# Patient Record
Sex: Female | Born: 1935 | Race: White | Hispanic: No | Smoking: Former smoker
Health system: Southern US, Community
[De-identification: ages and names within clinical notes are randomized; demographics above are authoritative.]

## PROBLEM LIST (undated history)

## (undated) DIAGNOSIS — E039 Hypothyroidism, unspecified: Secondary | ICD-10-CM

## (undated) DIAGNOSIS — Z950 Presence of cardiac pacemaker: Secondary | ICD-10-CM

## (undated) DIAGNOSIS — I10 Essential (primary) hypertension: Secondary | ICD-10-CM

## (undated) DIAGNOSIS — D649 Anemia, unspecified: Secondary | ICD-10-CM

## (undated) DIAGNOSIS — E871 Hypo-osmolality and hyponatremia: Secondary | ICD-10-CM

## (undated) DIAGNOSIS — Z9889 Other specified postprocedural states: Secondary | ICD-10-CM

## (undated) DIAGNOSIS — T4145XA Adverse effect of unspecified anesthetic, initial encounter: Secondary | ICD-10-CM

## (undated) DIAGNOSIS — Z9289 Personal history of other medical treatment: Secondary | ICD-10-CM

## (undated) DIAGNOSIS — R112 Nausea with vomiting, unspecified: Secondary | ICD-10-CM

## (undated) DIAGNOSIS — M1990 Unspecified osteoarthritis, unspecified site: Secondary | ICD-10-CM

## (undated) DIAGNOSIS — T8859XA Other complications of anesthesia, initial encounter: Secondary | ICD-10-CM

## (undated) DIAGNOSIS — E538 Deficiency of other specified B group vitamins: Secondary | ICD-10-CM

## (undated) DIAGNOSIS — K219 Gastro-esophageal reflux disease without esophagitis: Secondary | ICD-10-CM

## (undated) DIAGNOSIS — J189 Pneumonia, unspecified organism: Secondary | ICD-10-CM

## (undated) DIAGNOSIS — J449 Chronic obstructive pulmonary disease, unspecified: Secondary | ICD-10-CM

## (undated) DIAGNOSIS — G4700 Insomnia, unspecified: Secondary | ICD-10-CM

## (undated) DIAGNOSIS — K3189 Other diseases of stomach and duodenum: Secondary | ICD-10-CM

## (undated) DIAGNOSIS — J45909 Unspecified asthma, uncomplicated: Secondary | ICD-10-CM

## (undated) DIAGNOSIS — C499 Malignant neoplasm of connective and soft tissue, unspecified: Secondary | ICD-10-CM

## (undated) DIAGNOSIS — C50919 Malignant neoplasm of unspecified site of unspecified female breast: Secondary | ICD-10-CM

## (undated) DIAGNOSIS — K5732 Diverticulitis of large intestine without perforation or abscess without bleeding: Secondary | ICD-10-CM

## (undated) DIAGNOSIS — A0472 Enterocolitis due to Clostridium difficile, not specified as recurrent: Secondary | ICD-10-CM

## (undated) DIAGNOSIS — D518 Other vitamin B12 deficiency anemias: Principal | ICD-10-CM

## (undated) DIAGNOSIS — F0390 Unspecified dementia without behavioral disturbance: Secondary | ICD-10-CM

## (undated) DIAGNOSIS — M5430 Sciatica, unspecified side: Secondary | ICD-10-CM

## (undated) DIAGNOSIS — K31819 Angiodysplasia of stomach and duodenum without bleeding: Secondary | ICD-10-CM

## (undated) DIAGNOSIS — F419 Anxiety disorder, unspecified: Secondary | ICD-10-CM

## (undated) DIAGNOSIS — K5520 Angiodysplasia of colon without hemorrhage: Secondary | ICD-10-CM

## (undated) DIAGNOSIS — N289 Disorder of kidney and ureter, unspecified: Secondary | ICD-10-CM

## (undated) DIAGNOSIS — L989 Disorder of the skin and subcutaneous tissue, unspecified: Principal | ICD-10-CM

## (undated) DIAGNOSIS — Z9981 Dependence on supplemental oxygen: Secondary | ICD-10-CM

## (undated) DIAGNOSIS — M720 Palmar fascial fibromatosis [Dupuytren]: Secondary | ICD-10-CM

## (undated) DIAGNOSIS — B029 Zoster without complications: Secondary | ICD-10-CM

## (undated) DIAGNOSIS — I4891 Unspecified atrial fibrillation: Secondary | ICD-10-CM

## (undated) DIAGNOSIS — N390 Urinary tract infection, site not specified: Secondary | ICD-10-CM

## (undated) DIAGNOSIS — C801 Malignant (primary) neoplasm, unspecified: Secondary | ICD-10-CM

## (undated) HISTORY — PX: ABDOMINAL HYSTERECTOMY: SUR658

## (undated) HISTORY — PX: NASAL SINUS SURGERY: SHX719

## (undated) HISTORY — DX: Unspecified osteoarthritis, unspecified site: M19.90

## (undated) HISTORY — DX: Other diseases of stomach and duodenum: K31.89

## (undated) HISTORY — DX: Enterocolitis due to Clostridium difficile, not specified as recurrent: A04.72

## (undated) HISTORY — PX: EYE SURGERY: SHX253

## (undated) HISTORY — DX: Angiodysplasia of colon without hemorrhage: K55.20

## (undated) HISTORY — PX: HERNIA REPAIR: SHX51

## (undated) HISTORY — PX: BREAST SURGERY: SHX581

## (undated) HISTORY — DX: Urinary tract infection, site not specified: N39.0

## (undated) HISTORY — DX: Anemia, unspecified: D64.9

## (undated) HISTORY — PX: FINGER AMPUTATION: SHX636

## (undated) HISTORY — DX: Sciatica, unspecified side: M54.30

## (undated) HISTORY — DX: Hypo-osmolality and hyponatremia: E87.1

## (undated) HISTORY — PX: TONSILLECTOMY: SUR1361

## (undated) HISTORY — DX: Pneumonia, unspecified organism: J18.9

## (undated) HISTORY — DX: Angiodysplasia of stomach and duodenum without bleeding: K31.819

## (undated) HISTORY — PX: INSERT / REPLACE / REMOVE PACEMAKER: SUR710

## (undated) HISTORY — DX: Diverticulitis of large intestine without perforation or abscess without bleeding: K57.32

## (undated) HISTORY — DX: Gastro-esophageal reflux disease without esophagitis: K21.9

## (undated) HISTORY — DX: Hypothyroidism, unspecified: E03.9

## (undated) HISTORY — DX: Unspecified dementia, unspecified severity, without behavioral disturbance, psychotic disturbance, mood disturbance, and anxiety: F03.90

## (undated) HISTORY — PX: APPENDECTOMY: SHX54

## (undated) HISTORY — DX: Insomnia, unspecified: G47.00

## (undated) HISTORY — DX: Zoster without complications: B02.9

## (undated) HISTORY — PX: ABDOMINAL WOUND DEHISCENCE: SHX540

## (undated) HISTORY — DX: Malignant neoplasm of unspecified site of unspecified female breast: C50.919

## (undated) HISTORY — DX: Malignant neoplasm of connective and soft tissue, unspecified: C49.9

## (undated) HISTORY — DX: Chronic obstructive pulmonary disease, unspecified: J44.9

## (undated) HISTORY — DX: Other vitamin B12 deficiency anemias: D51.8

## (undated) HISTORY — DX: Disorder of kidney and ureter, unspecified: N28.9

## (undated) HISTORY — PX: ABDOMINAL HYSTERECTOMY: SHX81

## (undated) HISTORY — DX: Essential (primary) hypertension: I10

## (undated) HISTORY — DX: Disorder of the skin and subcutaneous tissue, unspecified: L98.9

## (undated) HISTORY — DX: Unspecified atrial fibrillation: I48.91

## (undated) HISTORY — PX: FOOT SURGERY: SHX648

## (undated) HISTORY — DX: Deficiency of other specified B group vitamins: E53.8

## (undated) HISTORY — PX: COLON SURGERY: SHX602

## (undated) HISTORY — PX: PACEMAKER INSERTION: SHX728

---

## 2000-05-31 ENCOUNTER — Ambulatory Visit (HOSPITAL_COMMUNITY): Admission: RE | Admit: 2000-05-31 | Discharge: 2000-05-31 | Payer: Self-pay | Admitting: Internal Medicine

## 2000-05-31 ENCOUNTER — Encounter: Payer: Self-pay | Admitting: Internal Medicine

## 2002-02-19 ENCOUNTER — Emergency Department (HOSPITAL_COMMUNITY): Admission: EM | Admit: 2002-02-19 | Discharge: 2002-02-19 | Payer: Self-pay | Admitting: Emergency Medicine

## 2002-02-19 ENCOUNTER — Inpatient Hospital Stay (HOSPITAL_COMMUNITY): Admission: EM | Admit: 2002-02-19 | Discharge: 2002-02-21 | Payer: Self-pay | Admitting: Internal Medicine

## 2002-02-19 ENCOUNTER — Encounter: Payer: Self-pay | Admitting: Emergency Medicine

## 2002-06-10 ENCOUNTER — Encounter: Admission: RE | Admit: 2002-06-10 | Discharge: 2002-06-10 | Payer: Self-pay | Admitting: Orthopedic Surgery

## 2002-06-24 ENCOUNTER — Encounter: Admission: RE | Admit: 2002-06-24 | Discharge: 2002-06-24 | Payer: Self-pay | Admitting: Orthopedic Surgery

## 2002-06-24 ENCOUNTER — Encounter: Payer: Self-pay | Admitting: Orthopedic Surgery

## 2002-07-05 ENCOUNTER — Encounter: Admission: RE | Admit: 2002-07-05 | Discharge: 2002-07-05 | Payer: Self-pay | Admitting: General Surgery

## 2002-07-05 ENCOUNTER — Other Ambulatory Visit: Admission: RE | Admit: 2002-07-05 | Discharge: 2002-07-05 | Payer: Self-pay | Admitting: Diagnostic Radiology

## 2002-07-05 ENCOUNTER — Encounter (INDEPENDENT_AMBULATORY_CARE_PROVIDER_SITE_OTHER): Payer: Self-pay | Admitting: *Deleted

## 2002-07-05 ENCOUNTER — Encounter: Payer: Self-pay | Admitting: General Surgery

## 2002-07-16 ENCOUNTER — Encounter: Payer: Self-pay | Admitting: General Surgery

## 2002-07-16 ENCOUNTER — Encounter: Admission: RE | Admit: 2002-07-16 | Discharge: 2002-07-16 | Payer: Self-pay | Admitting: General Surgery

## 2002-07-17 ENCOUNTER — Ambulatory Visit (HOSPITAL_BASED_OUTPATIENT_CLINIC_OR_DEPARTMENT_OTHER): Admission: RE | Admit: 2002-07-17 | Discharge: 2002-07-17 | Payer: Self-pay | Admitting: General Surgery

## 2002-07-17 ENCOUNTER — Encounter: Admission: RE | Admit: 2002-07-17 | Discharge: 2002-07-17 | Payer: Self-pay | Admitting: General Surgery

## 2002-07-17 ENCOUNTER — Encounter (INDEPENDENT_AMBULATORY_CARE_PROVIDER_SITE_OTHER): Payer: Self-pay | Admitting: *Deleted

## 2002-07-17 ENCOUNTER — Encounter: Payer: Self-pay | Admitting: General Surgery

## 2002-07-29 ENCOUNTER — Ambulatory Visit: Admission: RE | Admit: 2002-07-29 | Discharge: 2002-08-06 | Payer: Self-pay | Admitting: Radiation Oncology

## 2002-08-19 ENCOUNTER — Encounter: Payer: Self-pay | Admitting: Orthopedic Surgery

## 2002-08-19 ENCOUNTER — Encounter: Admission: RE | Admit: 2002-08-19 | Discharge: 2002-08-19 | Payer: Self-pay | Admitting: Orthopedic Surgery

## 2002-08-20 ENCOUNTER — Ambulatory Visit (HOSPITAL_BASED_OUTPATIENT_CLINIC_OR_DEPARTMENT_OTHER): Admission: RE | Admit: 2002-08-20 | Discharge: 2002-08-20 | Payer: Self-pay | Admitting: General Surgery

## 2002-12-16 ENCOUNTER — Encounter: Payer: Self-pay | Admitting: Orthopedic Surgery

## 2002-12-16 ENCOUNTER — Encounter: Admission: RE | Admit: 2002-12-16 | Discharge: 2002-12-16 | Payer: Self-pay | Admitting: Orthopedic Surgery

## 2003-01-01 ENCOUNTER — Encounter: Admission: RE | Admit: 2003-01-01 | Discharge: 2003-01-01 | Payer: Self-pay | Admitting: Orthopedic Surgery

## 2003-01-01 ENCOUNTER — Encounter: Payer: Self-pay | Admitting: Orthopedic Surgery

## 2003-02-25 ENCOUNTER — Encounter: Payer: Self-pay | Admitting: Oncology

## 2003-02-25 ENCOUNTER — Encounter: Admission: RE | Admit: 2003-02-25 | Discharge: 2003-02-25 | Payer: Self-pay | Admitting: Oncology

## 2003-03-20 ENCOUNTER — Encounter: Payer: Self-pay | Admitting: Allergy and Immunology

## 2003-03-20 ENCOUNTER — Encounter: Admission: RE | Admit: 2003-03-20 | Discharge: 2003-03-20 | Payer: Self-pay | Admitting: Allergy and Immunology

## 2003-03-25 ENCOUNTER — Encounter: Payer: Self-pay | Admitting: Emergency Medicine

## 2003-03-25 ENCOUNTER — Emergency Department (HOSPITAL_COMMUNITY): Admission: EM | Admit: 2003-03-25 | Discharge: 2003-03-25 | Payer: Self-pay | Admitting: Emergency Medicine

## 2003-04-28 ENCOUNTER — Encounter: Admission: RE | Admit: 2003-04-28 | Discharge: 2003-04-28 | Payer: Self-pay | Admitting: Orthopedic Surgery

## 2003-05-20 ENCOUNTER — Inpatient Hospital Stay (HOSPITAL_COMMUNITY): Admission: EM | Admit: 2003-05-20 | Discharge: 2003-05-28 | Payer: Self-pay | Admitting: Emergency Medicine

## 2003-05-21 ENCOUNTER — Encounter: Payer: Self-pay | Admitting: Cardiology

## 2003-06-16 ENCOUNTER — Encounter: Admission: RE | Admit: 2003-06-16 | Discharge: 2003-06-16 | Payer: Self-pay | Admitting: Oncology

## 2003-07-31 ENCOUNTER — Emergency Department (HOSPITAL_COMMUNITY): Admission: EM | Admit: 2003-07-31 | Discharge: 2003-07-31 | Payer: Self-pay | Admitting: Emergency Medicine

## 2003-10-02 ENCOUNTER — Encounter: Admission: RE | Admit: 2003-10-02 | Discharge: 2003-10-02 | Payer: Self-pay | Admitting: Orthopedic Surgery

## 2003-10-20 ENCOUNTER — Ambulatory Visit (HOSPITAL_COMMUNITY): Admission: RE | Admit: 2003-10-20 | Discharge: 2003-10-20 | Payer: Self-pay | Admitting: Internal Medicine

## 2003-11-20 ENCOUNTER — Inpatient Hospital Stay (HOSPITAL_COMMUNITY): Admission: RE | Admit: 2003-11-20 | Discharge: 2003-11-25 | Payer: Self-pay | Admitting: Surgery

## 2003-11-20 ENCOUNTER — Encounter: Admission: RE | Admit: 2003-11-20 | Discharge: 2003-12-16 | Payer: Self-pay | Admitting: General Surgery

## 2004-02-25 ENCOUNTER — Encounter: Admission: RE | Admit: 2004-02-25 | Discharge: 2004-02-25 | Payer: Self-pay | Admitting: Orthopedic Surgery

## 2004-03-01 ENCOUNTER — Encounter: Admission: RE | Admit: 2004-03-01 | Discharge: 2004-03-01 | Payer: Self-pay | Admitting: Orthopedic Surgery

## 2004-03-16 ENCOUNTER — Encounter: Admission: RE | Admit: 2004-03-16 | Discharge: 2004-03-16 | Payer: Self-pay | Admitting: Orthopedic Surgery

## 2004-04-21 ENCOUNTER — Ambulatory Visit: Payer: Self-pay | Admitting: Internal Medicine

## 2004-04-22 ENCOUNTER — Ambulatory Visit: Payer: Self-pay | Admitting: Cardiology

## 2004-04-28 ENCOUNTER — Ambulatory Visit: Payer: Self-pay | Admitting: *Deleted

## 2004-04-29 ENCOUNTER — Encounter (INDEPENDENT_AMBULATORY_CARE_PROVIDER_SITE_OTHER): Payer: Self-pay | Admitting: Specialist

## 2004-04-29 ENCOUNTER — Ambulatory Visit (HOSPITAL_COMMUNITY): Admission: RE | Admit: 2004-04-29 | Discharge: 2004-04-29 | Payer: Self-pay | Admitting: Internal Medicine

## 2004-04-29 ENCOUNTER — Ambulatory Visit: Payer: Self-pay | Admitting: Internal Medicine

## 2004-05-05 ENCOUNTER — Ambulatory Visit: Payer: Self-pay | Admitting: Cardiovascular Disease

## 2004-05-25 ENCOUNTER — Ambulatory Visit: Payer: Self-pay | Admitting: Cardiology

## 2004-06-16 ENCOUNTER — Encounter: Admission: RE | Admit: 2004-06-16 | Discharge: 2004-06-16 | Payer: Self-pay | Admitting: Oncology

## 2004-06-23 ENCOUNTER — Ambulatory Visit: Payer: Self-pay | Admitting: Oncology

## 2004-07-10 ENCOUNTER — Ambulatory Visit: Payer: Self-pay | Admitting: Internal Medicine

## 2004-07-22 ENCOUNTER — Ambulatory Visit: Payer: Self-pay | Admitting: Internal Medicine

## 2004-07-27 ENCOUNTER — Ambulatory Visit: Payer: Self-pay | Admitting: Cardiology

## 2004-08-10 ENCOUNTER — Ambulatory Visit: Payer: Self-pay | Admitting: *Deleted

## 2004-08-10 ENCOUNTER — Ambulatory Visit: Payer: Self-pay | Admitting: Internal Medicine

## 2004-08-18 ENCOUNTER — Ambulatory Visit: Payer: Self-pay | Admitting: Internal Medicine

## 2004-08-20 ENCOUNTER — Ambulatory Visit: Payer: Self-pay | Admitting: Cardiology

## 2004-09-01 ENCOUNTER — Ambulatory Visit: Payer: Self-pay | Admitting: Oncology

## 2004-09-03 ENCOUNTER — Ambulatory Visit: Payer: Self-pay | Admitting: Internal Medicine

## 2004-09-20 ENCOUNTER — Encounter: Admission: RE | Admit: 2004-09-20 | Discharge: 2004-09-20 | Payer: Self-pay | Admitting: Oncology

## 2004-09-20 ENCOUNTER — Encounter (INDEPENDENT_AMBULATORY_CARE_PROVIDER_SITE_OTHER): Payer: Self-pay | Admitting: *Deleted

## 2004-09-23 ENCOUNTER — Ambulatory Visit: Payer: Self-pay | Admitting: Internal Medicine

## 2004-10-11 ENCOUNTER — Ambulatory Visit (HOSPITAL_COMMUNITY): Admission: RE | Admit: 2004-10-11 | Discharge: 2004-10-11 | Payer: Self-pay | Admitting: Orthopedic Surgery

## 2004-10-15 ENCOUNTER — Ambulatory Visit: Payer: Self-pay | Admitting: Internal Medicine

## 2004-10-18 ENCOUNTER — Ambulatory Visit (HOSPITAL_COMMUNITY): Admission: RE | Admit: 2004-10-18 | Discharge: 2004-10-18 | Payer: Self-pay | Admitting: Internal Medicine

## 2004-10-20 ENCOUNTER — Ambulatory Visit: Payer: Self-pay | Admitting: Internal Medicine

## 2004-10-21 ENCOUNTER — Ambulatory Visit: Payer: Self-pay | Admitting: Internal Medicine

## 2004-10-28 ENCOUNTER — Ambulatory Visit: Payer: Self-pay | Admitting: Internal Medicine

## 2004-11-26 ENCOUNTER — Ambulatory Visit: Payer: Self-pay | Admitting: Cardiology

## 2004-12-01 ENCOUNTER — Ambulatory Visit: Payer: Self-pay | Admitting: Oncology

## 2004-12-03 ENCOUNTER — Ambulatory Visit: Payer: Self-pay | Admitting: Cardiology

## 2004-12-07 ENCOUNTER — Ambulatory Visit: Payer: Self-pay | Admitting: Oncology

## 2004-12-14 ENCOUNTER — Ambulatory Visit: Payer: Self-pay | Admitting: Cardiology

## 2004-12-31 ENCOUNTER — Encounter: Admission: RE | Admit: 2004-12-31 | Discharge: 2004-12-31 | Payer: Self-pay | Admitting: Allergy and Immunology

## 2005-01-04 ENCOUNTER — Ambulatory Visit: Payer: Self-pay | Admitting: Cardiology

## 2005-02-03 ENCOUNTER — Ambulatory Visit: Payer: Self-pay | Admitting: Internal Medicine

## 2005-02-04 ENCOUNTER — Ambulatory Visit: Payer: Self-pay | Admitting: Internal Medicine

## 2005-03-09 ENCOUNTER — Ambulatory Visit: Payer: Self-pay | Admitting: Cardiology

## 2005-03-10 ENCOUNTER — Encounter: Admission: RE | Admit: 2005-03-10 | Discharge: 2005-03-10 | Payer: Self-pay | Admitting: Orthopedic Surgery

## 2005-03-11 ENCOUNTER — Ambulatory Visit: Payer: Self-pay | Admitting: Internal Medicine

## 2005-03-31 ENCOUNTER — Ambulatory Visit: Payer: Self-pay | Admitting: Cardiology

## 2005-04-01 ENCOUNTER — Ambulatory Visit: Payer: Self-pay | Admitting: Oncology

## 2005-04-06 ENCOUNTER — Ambulatory Visit: Payer: Self-pay | Admitting: Cardiology

## 2005-04-07 ENCOUNTER — Encounter: Admission: RE | Admit: 2005-04-07 | Discharge: 2005-04-07 | Payer: Self-pay | Admitting: Orthopedic Surgery

## 2005-04-11 ENCOUNTER — Ambulatory Visit (HOSPITAL_COMMUNITY): Admission: RE | Admit: 2005-04-11 | Discharge: 2005-04-11 | Payer: Self-pay | Admitting: Oncology

## 2005-04-11 ENCOUNTER — Ambulatory Visit: Payer: Self-pay | Admitting: Internal Medicine

## 2005-04-14 ENCOUNTER — Ambulatory Visit: Payer: Self-pay | Admitting: Internal Medicine

## 2005-04-20 ENCOUNTER — Ambulatory Visit (HOSPITAL_COMMUNITY): Admission: RE | Admit: 2005-04-20 | Discharge: 2005-04-20 | Payer: Self-pay | Admitting: Oncology

## 2005-04-22 ENCOUNTER — Ambulatory Visit: Payer: Self-pay | Admitting: Internal Medicine

## 2005-04-27 ENCOUNTER — Ambulatory Visit (HOSPITAL_COMMUNITY): Admission: RE | Admit: 2005-04-27 | Discharge: 2005-04-27 | Payer: Self-pay | Admitting: Oncology

## 2005-05-10 ENCOUNTER — Encounter: Admission: RE | Admit: 2005-05-10 | Discharge: 2005-05-10 | Payer: Self-pay | Admitting: Orthopedic Surgery

## 2005-05-10 ENCOUNTER — Ambulatory Visit: Payer: Self-pay | Admitting: Cardiology

## 2005-05-17 ENCOUNTER — Ambulatory Visit: Payer: Self-pay | Admitting: *Deleted

## 2005-05-17 ENCOUNTER — Ambulatory Visit: Payer: Self-pay | Admitting: Internal Medicine

## 2005-05-31 ENCOUNTER — Ambulatory Visit: Payer: Self-pay | Admitting: Cardiology

## 2005-06-15 ENCOUNTER — Ambulatory Visit: Payer: Self-pay | Admitting: *Deleted

## 2005-06-29 ENCOUNTER — Ambulatory Visit: Payer: Self-pay | Admitting: Internal Medicine

## 2005-07-06 ENCOUNTER — Ambulatory Visit: Payer: Self-pay | Admitting: Cardiology

## 2005-07-13 ENCOUNTER — Encounter: Admission: RE | Admit: 2005-07-13 | Discharge: 2005-07-13 | Payer: Self-pay | Admitting: General Surgery

## 2005-07-19 ENCOUNTER — Ambulatory Visit (HOSPITAL_COMMUNITY): Admission: RE | Admit: 2005-07-19 | Discharge: 2005-07-19 | Payer: Self-pay | Admitting: Oncology

## 2005-08-10 ENCOUNTER — Ambulatory Visit: Payer: Self-pay | Admitting: Cardiology

## 2005-08-10 ENCOUNTER — Ambulatory Visit: Payer: Self-pay | Admitting: Internal Medicine

## 2005-08-26 ENCOUNTER — Ambulatory Visit: Payer: Self-pay | Admitting: Oncology

## 2005-08-29 ENCOUNTER — Ambulatory Visit (HOSPITAL_COMMUNITY): Admission: RE | Admit: 2005-08-29 | Discharge: 2005-08-29 | Payer: Self-pay | Admitting: Oncology

## 2005-08-31 ENCOUNTER — Ambulatory Visit: Payer: Self-pay | Admitting: Cardiology

## 2005-09-05 ENCOUNTER — Ambulatory Visit: Payer: Self-pay | Admitting: Internal Medicine

## 2005-09-07 ENCOUNTER — Ambulatory Visit: Payer: Self-pay | Admitting: Internal Medicine

## 2005-09-26 ENCOUNTER — Ambulatory Visit: Payer: Self-pay | Admitting: Cardiology

## 2005-10-04 ENCOUNTER — Ambulatory Visit: Payer: Self-pay | Admitting: Internal Medicine

## 2005-10-04 ENCOUNTER — Ambulatory Visit: Payer: Self-pay | Admitting: Cardiology

## 2005-10-18 ENCOUNTER — Ambulatory Visit: Payer: Self-pay | Admitting: Cardiology

## 2005-10-21 ENCOUNTER — Inpatient Hospital Stay (HOSPITAL_COMMUNITY): Admission: EM | Admit: 2005-10-21 | Discharge: 2005-10-25 | Payer: Self-pay | Admitting: Emergency Medicine

## 2005-10-21 ENCOUNTER — Encounter: Payer: Self-pay | Admitting: Internal Medicine

## 2005-10-26 ENCOUNTER — Ambulatory Visit: Payer: Self-pay | Admitting: Cardiology

## 2005-10-31 ENCOUNTER — Ambulatory Visit: Payer: Self-pay | Admitting: Cardiology

## 2005-11-16 ENCOUNTER — Ambulatory Visit: Payer: Self-pay | Admitting: Oncology

## 2005-11-17 ENCOUNTER — Ambulatory Visit: Payer: Self-pay | Admitting: Cardiology

## 2005-11-22 LAB — CBC WITH DIFFERENTIAL/PLATELET
Basophils Absolute: 0 10*3/uL (ref 0.0–0.1)
HCT: 34.6 % — ABNORMAL LOW (ref 34.8–46.6)
HGB: 11.6 g/dL (ref 11.6–15.9)
MONO#: 0.1 10*3/uL (ref 0.1–0.9)
NEUT%: 90.3 % — ABNORMAL HIGH (ref 39.6–76.8)
WBC: 8 10*3/uL (ref 3.9–10.0)
lymph#: 0.6 10*3/uL — ABNORMAL LOW (ref 0.9–3.3)

## 2005-11-22 LAB — COMPREHENSIVE METABOLIC PANEL
BUN: 23 mg/dL (ref 6–23)
CO2: 26 mEq/L (ref 19–32)
Calcium: 9.2 mg/dL (ref 8.4–10.5)
Chloride: 105 mEq/L (ref 96–112)
Creatinine, Ser: 1.11 mg/dL (ref 0.40–1.20)
Glucose, Bld: 99 mg/dL (ref 70–99)

## 2005-11-23 ENCOUNTER — Ambulatory Visit: Payer: Self-pay | Admitting: Internal Medicine

## 2005-11-24 ENCOUNTER — Ambulatory Visit (HOSPITAL_COMMUNITY): Admission: RE | Admit: 2005-11-24 | Discharge: 2005-11-24 | Payer: Self-pay | Admitting: Oncology

## 2005-12-08 ENCOUNTER — Ambulatory Visit: Payer: Self-pay | Admitting: Cardiology

## 2006-01-03 ENCOUNTER — Ambulatory Visit: Payer: Self-pay | Admitting: Cardiology

## 2006-01-19 ENCOUNTER — Ambulatory Visit: Payer: Self-pay | Admitting: Internal Medicine

## 2006-01-31 ENCOUNTER — Ambulatory Visit: Payer: Self-pay | Admitting: Cardiology

## 2006-02-27 ENCOUNTER — Ambulatory Visit: Payer: Self-pay | Admitting: Oncology

## 2006-02-28 ENCOUNTER — Ambulatory Visit: Payer: Self-pay | Admitting: Cardiovascular Disease

## 2006-03-01 ENCOUNTER — Ambulatory Visit (HOSPITAL_COMMUNITY): Admission: RE | Admit: 2006-03-01 | Discharge: 2006-03-01 | Payer: Self-pay | Admitting: Oncology

## 2006-03-01 LAB — COMPREHENSIVE METABOLIC PANEL
ALT: 21 U/L (ref 0–40)
Alkaline Phosphatase: 45 U/L (ref 39–117)
CO2: 22 mEq/L (ref 19–32)
Creatinine, Ser: 1.23 mg/dL — ABNORMAL HIGH (ref 0.40–1.20)
Glucose, Bld: 103 mg/dL — ABNORMAL HIGH (ref 70–99)
Total Bilirubin: 0.4 mg/dL (ref 0.3–1.2)

## 2006-03-01 LAB — CBC WITH DIFFERENTIAL/PLATELET
BASO%: 0.5 % (ref 0.0–2.0)
Basophils Absolute: 0 10*3/uL (ref 0.0–0.1)
EOS%: 1.6 % (ref 0.0–7.0)
HCT: 37.9 % (ref 34.8–46.6)
LYMPH%: 11.5 % — ABNORMAL LOW (ref 14.0–48.0)
MCH: 29.8 pg (ref 26.0–34.0)
MCHC: 33.7 g/dL (ref 32.0–36.0)
MONO#: 0.4 10*3/uL (ref 0.1–0.9)
NEUT%: 82 % — ABNORMAL HIGH (ref 39.6–76.8)
Platelets: 290 10*3/uL (ref 145–400)

## 2006-03-01 LAB — CANCER ANTIGEN 27.29: CA 27.29: 37 U/mL (ref 0–39)

## 2006-03-22 ENCOUNTER — Ambulatory Visit: Payer: Self-pay | Admitting: Internal Medicine

## 2006-03-28 ENCOUNTER — Ambulatory Visit: Payer: Self-pay | Admitting: Internal Medicine

## 2006-04-25 ENCOUNTER — Ambulatory Visit: Payer: Self-pay | Admitting: *Deleted

## 2006-05-02 ENCOUNTER — Ambulatory Visit (HOSPITAL_COMMUNITY): Admission: RE | Admit: 2006-05-02 | Discharge: 2006-05-02 | Payer: Self-pay | Admitting: Oncology

## 2006-06-28 ENCOUNTER — Ambulatory Visit: Payer: Self-pay | Admitting: Internal Medicine

## 2006-06-28 ENCOUNTER — Encounter: Admission: RE | Admit: 2006-06-28 | Discharge: 2006-06-28 | Payer: Self-pay | Admitting: Internal Medicine

## 2006-06-28 ENCOUNTER — Ambulatory Visit: Payer: Self-pay | Admitting: Cardiology

## 2006-07-07 ENCOUNTER — Ambulatory Visit: Payer: Self-pay | Admitting: Cardiovascular Disease

## 2006-07-14 ENCOUNTER — Ambulatory Visit: Payer: Self-pay | Admitting: Internal Medicine

## 2006-07-14 LAB — CONVERTED CEMR LAB
BUN: 29 mg/dL — ABNORMAL HIGH (ref 6–23)
CO2: 27 meq/L (ref 19–32)
Calcium: 9.6 mg/dL (ref 8.4–10.5)
Chloride: 107 meq/L (ref 96–112)
Creatinine, Ser: 1.3 mg/dL — ABNORMAL HIGH (ref 0.4–1.2)
GFR calc Af Amer: 52 mL/min
GFR calc non Af Amer: 43 mL/min
Glucose, Bld: 89 mg/dL (ref 70–99)
HCT: 31.5 % — ABNORMAL LOW (ref 36.0–46.0)
Hemoglobin: 10.8 g/dL — ABNORMAL LOW (ref 12.0–15.0)
INR: 2.1 — ABNORMAL HIGH (ref 0.9–2.0)
MCHC: 34.4 g/dL (ref 30.0–36.0)
MCV: 87.8 fL (ref 78.0–100.0)
Platelets: 210 10*3/uL (ref 150–400)
Potassium: 4.3 meq/L (ref 3.5–5.1)
Prothrombin Time: 18.6 s — ABNORMAL HIGH (ref 10.0–14.0)
RBC: 3.58 M/uL — ABNORMAL LOW (ref 3.87–5.11)
RDW: 13.2 % (ref 11.5–14.6)
Sodium: 139 meq/L (ref 135–145)
WBC: 8.8 10*3/uL (ref 4.5–10.5)
aPTT: 30.3 s (ref 26.5–36.5)

## 2006-07-18 ENCOUNTER — Encounter: Admission: RE | Admit: 2006-07-18 | Discharge: 2006-07-18 | Payer: Self-pay | Admitting: General Surgery

## 2006-07-20 ENCOUNTER — Ambulatory Visit: Payer: Self-pay | Admitting: Internal Medicine

## 2006-07-20 ENCOUNTER — Ambulatory Visit (HOSPITAL_COMMUNITY): Admission: RE | Admit: 2006-07-20 | Discharge: 2006-07-20 | Payer: Self-pay | Admitting: Internal Medicine

## 2006-07-21 ENCOUNTER — Ambulatory Visit: Payer: Self-pay | Admitting: Internal Medicine

## 2006-08-02 ENCOUNTER — Ambulatory Visit: Payer: Self-pay | Admitting: *Deleted

## 2006-08-02 ENCOUNTER — Ambulatory Visit: Payer: Self-pay

## 2006-08-11 ENCOUNTER — Ambulatory Visit: Payer: Self-pay | Admitting: Internal Medicine

## 2006-08-23 ENCOUNTER — Ambulatory Visit: Payer: Self-pay | Admitting: Oncology

## 2006-08-25 ENCOUNTER — Ambulatory Visit: Payer: Self-pay | Admitting: Cardiology

## 2006-08-28 LAB — COMPREHENSIVE METABOLIC PANEL
ALT: 17 U/L (ref 0–35)
AST: 20 U/L (ref 0–37)
Albumin: 3.7 g/dL (ref 3.5–5.2)
Alkaline Phosphatase: 53 U/L (ref 39–117)
BUN: 23 mg/dL (ref 6–23)
Chloride: 107 mEq/L (ref 96–112)
Potassium: 3.8 mEq/L (ref 3.5–5.3)
Sodium: 140 mEq/L (ref 135–145)

## 2006-08-28 LAB — CBC WITH DIFFERENTIAL/PLATELET
BASO%: 0.2 % (ref 0.0–2.0)
Basophils Absolute: 0 10*3/uL (ref 0.0–0.1)
HCT: 30.3 % — ABNORMAL LOW (ref 34.8–46.6)
HGB: 10.4 g/dL — ABNORMAL LOW (ref 11.6–15.9)
MCHC: 34.3 g/dL (ref 32.0–36.0)
MONO#: 0.7 10*3/uL (ref 0.1–0.9)
NEUT%: 83.9 % — ABNORMAL HIGH (ref 39.6–76.8)
WBC: 11.7 10*3/uL — ABNORMAL HIGH (ref 3.9–10.0)
lymph#: 0.9 10*3/uL (ref 0.9–3.3)

## 2006-08-29 ENCOUNTER — Ambulatory Visit: Payer: Self-pay

## 2006-09-04 ENCOUNTER — Ambulatory Visit: Payer: Self-pay | Admitting: Internal Medicine

## 2006-09-04 LAB — CBC & DIFF AND RETIC
Basophils Absolute: 0.1 10*3/uL (ref 0.0–0.1)
EOS%: 4.9 % (ref 0.0–7.0)
Eosinophils Absolute: 0.5 10*3/uL (ref 0.0–0.5)
HGB: 12 g/dL (ref 11.6–15.9)
LYMPH%: 8.9 % — ABNORMAL LOW (ref 14.0–48.0)
MCH: 28.2 pg (ref 26.0–34.0)
MCV: 83.9 fL (ref 81.0–101.0)
MONO%: 3.4 % (ref 0.0–13.0)
NEUT#: 8.6 10*3/uL — ABNORMAL HIGH (ref 1.5–6.5)
Platelets: 268 10*3/uL (ref 145–400)
RBC: 4.24 10*6/uL (ref 3.70–5.32)

## 2006-09-05 LAB — VITAMIN B12: Vitamin B-12: 649 pg/mL (ref 211–911)

## 2006-09-05 LAB — FOLATE RBC: RBC Folate: 1227 ng/mL — ABNORMAL HIGH (ref 180–600)

## 2006-09-14 ENCOUNTER — Ambulatory Visit: Payer: Self-pay | Admitting: Cardiology

## 2006-09-28 ENCOUNTER — Ambulatory Visit: Payer: Self-pay | Admitting: Cardiology

## 2006-10-26 ENCOUNTER — Ambulatory Visit: Payer: Self-pay | Admitting: Cardiology

## 2006-11-07 ENCOUNTER — Ambulatory Visit: Payer: Self-pay | Admitting: Internal Medicine

## 2006-11-29 ENCOUNTER — Ambulatory Visit: Payer: Self-pay | Admitting: Cardiology

## 2007-02-05 ENCOUNTER — Ambulatory Visit: Payer: Self-pay | Admitting: Cardiology

## 2007-02-22 ENCOUNTER — Ambulatory Visit: Payer: Self-pay | Admitting: Cardiology

## 2007-03-16 ENCOUNTER — Ambulatory Visit: Payer: Self-pay | Admitting: Cardiovascular Disease

## 2007-03-30 ENCOUNTER — Ambulatory Visit: Payer: Self-pay | Admitting: Cardiology

## 2007-04-05 ENCOUNTER — Ambulatory Visit: Payer: Self-pay | Admitting: Oncology

## 2007-04-10 LAB — CBC WITH DIFFERENTIAL/PLATELET
Basophils Absolute: 0 10*3/uL (ref 0.0–0.1)
Eosinophils Absolute: 0.6 10*3/uL — ABNORMAL HIGH (ref 0.0–0.5)
HCT: 34.6 % — ABNORMAL LOW (ref 34.8–46.6)
HGB: 12 g/dL (ref 11.6–15.9)
NEUT#: 4.7 10*3/uL (ref 1.5–6.5)
RDW: 13.9 % (ref 11.3–14.5)
lymph#: 2.2 10*3/uL (ref 0.9–3.3)

## 2007-04-10 LAB — COMPREHENSIVE METABOLIC PANEL
Albumin: 3.8 g/dL (ref 3.5–5.2)
BUN: 18 mg/dL (ref 6–23)
CO2: 23 mEq/L (ref 19–32)
Calcium: 9.4 mg/dL (ref 8.4–10.5)
Chloride: 105 mEq/L (ref 96–112)
Glucose, Bld: 81 mg/dL (ref 70–99)
Potassium: 3.7 mEq/L (ref 3.5–5.3)

## 2007-04-10 LAB — CANCER ANTIGEN 27.29: CA 27.29: 24 U/mL (ref 0–39)

## 2007-04-18 ENCOUNTER — Ambulatory Visit: Payer: Self-pay | Admitting: Cardiology

## 2007-05-04 ENCOUNTER — Ambulatory Visit: Payer: Self-pay | Admitting: Cardiology

## 2007-06-01 ENCOUNTER — Ambulatory Visit: Payer: Self-pay | Admitting: Cardiology

## 2007-07-03 ENCOUNTER — Ambulatory Visit: Payer: Self-pay | Admitting: Cardiovascular Disease

## 2007-07-20 ENCOUNTER — Encounter: Admission: RE | Admit: 2007-07-20 | Discharge: 2007-07-20 | Payer: Self-pay | Admitting: Oncology

## 2007-07-24 ENCOUNTER — Ambulatory Visit: Payer: Self-pay | Admitting: Cardiovascular Disease

## 2007-08-03 ENCOUNTER — Ambulatory Visit: Payer: Self-pay | Admitting: Internal Medicine

## 2007-08-23 ENCOUNTER — Ambulatory Visit: Payer: Self-pay | Admitting: Cardiovascular Disease

## 2007-09-05 ENCOUNTER — Ambulatory Visit: Payer: Self-pay | Admitting: Cardiology

## 2007-09-14 ENCOUNTER — Ambulatory Visit: Payer: Self-pay | Admitting: Cardiology

## 2007-09-19 ENCOUNTER — Ambulatory Visit: Payer: Self-pay | Admitting: Internal Medicine

## 2007-09-20 DIAGNOSIS — I10 Essential (primary) hypertension: Secondary | ICD-10-CM | POA: Insufficient documentation

## 2007-09-20 DIAGNOSIS — J45909 Unspecified asthma, uncomplicated: Secondary | ICD-10-CM | POA: Insufficient documentation

## 2007-09-20 DIAGNOSIS — K7689 Other specified diseases of liver: Secondary | ICD-10-CM

## 2007-09-20 DIAGNOSIS — C50919 Malignant neoplasm of unspecified site of unspecified female breast: Secondary | ICD-10-CM | POA: Insufficient documentation

## 2007-09-20 DIAGNOSIS — I4891 Unspecified atrial fibrillation: Secondary | ICD-10-CM | POA: Insufficient documentation

## 2007-09-20 DIAGNOSIS — M1990 Unspecified osteoarthritis, unspecified site: Secondary | ICD-10-CM | POA: Insufficient documentation

## 2007-09-20 DIAGNOSIS — E039 Hypothyroidism, unspecified: Secondary | ICD-10-CM | POA: Insufficient documentation

## 2007-09-20 DIAGNOSIS — Z9189 Other specified personal risk factors, not elsewhere classified: Secondary | ICD-10-CM

## 2007-09-20 DIAGNOSIS — M5430 Sciatica, unspecified side: Secondary | ICD-10-CM

## 2007-09-21 ENCOUNTER — Ambulatory Visit: Payer: Self-pay | Admitting: Pulmonary Disease

## 2007-09-21 DIAGNOSIS — K449 Diaphragmatic hernia without obstruction or gangrene: Secondary | ICD-10-CM | POA: Insufficient documentation

## 2007-09-21 DIAGNOSIS — C801 Malignant (primary) neoplasm, unspecified: Secondary | ICD-10-CM

## 2007-09-21 DIAGNOSIS — G4700 Insomnia, unspecified: Secondary | ICD-10-CM

## 2007-09-21 DIAGNOSIS — I509 Heart failure, unspecified: Secondary | ICD-10-CM

## 2007-09-21 DIAGNOSIS — J9600 Acute respiratory failure, unspecified whether with hypoxia or hypercapnia: Secondary | ICD-10-CM

## 2007-09-21 DIAGNOSIS — Q742 Other congenital malformations of lower limb(s), including pelvic girdle: Secondary | ICD-10-CM

## 2007-09-24 ENCOUNTER — Ambulatory Visit: Payer: Self-pay | Admitting: Pulmonary Disease

## 2007-10-04 ENCOUNTER — Ambulatory Visit: Payer: Self-pay | Admitting: Cardiology

## 2007-10-04 ENCOUNTER — Ambulatory Visit: Payer: Self-pay | Admitting: Pulmonary Disease

## 2007-10-04 DIAGNOSIS — J383 Other diseases of vocal cords: Secondary | ICD-10-CM

## 2007-10-18 ENCOUNTER — Ambulatory Visit: Payer: Self-pay | Admitting: Cardiology

## 2007-11-02 ENCOUNTER — Ambulatory Visit: Payer: Self-pay | Admitting: Cardiology

## 2007-11-06 ENCOUNTER — Ambulatory Visit: Payer: Self-pay | Admitting: Internal Medicine

## 2007-11-20 ENCOUNTER — Ambulatory Visit: Payer: Self-pay | Admitting: Cardiology

## 2007-11-30 ENCOUNTER — Ambulatory Visit: Payer: Self-pay | Admitting: Cardiology

## 2007-12-20 ENCOUNTER — Ambulatory Visit: Payer: Self-pay

## 2007-12-31 ENCOUNTER — Ambulatory Visit: Payer: Self-pay | Admitting: Cardiology

## 2008-01-14 ENCOUNTER — Ambulatory Visit: Payer: Self-pay | Admitting: Cardiovascular Disease

## 2008-01-28 ENCOUNTER — Ambulatory Visit: Payer: Self-pay | Admitting: Cardiology

## 2008-02-14 ENCOUNTER — Ambulatory Visit: Payer: Self-pay | Admitting: Cardiology

## 2008-02-18 ENCOUNTER — Ambulatory Visit: Payer: Self-pay | Admitting: Internal Medicine

## 2008-03-05 ENCOUNTER — Ambulatory Visit: Payer: Self-pay | Admitting: Cardiovascular Disease

## 2008-03-28 ENCOUNTER — Ambulatory Visit: Payer: Self-pay | Admitting: Oncology

## 2008-04-02 ENCOUNTER — Ambulatory Visit: Payer: Self-pay | Admitting: Cardiology

## 2008-05-05 ENCOUNTER — Ambulatory Visit: Payer: Self-pay | Admitting: Cardiology

## 2008-05-14 ENCOUNTER — Ambulatory Visit: Payer: Self-pay | Admitting: Cardiovascular Disease

## 2008-05-21 ENCOUNTER — Ambulatory Visit: Payer: Self-pay | Admitting: Internal Medicine

## 2008-05-29 ENCOUNTER — Ambulatory Visit: Payer: Self-pay | Admitting: Cardiology

## 2008-06-02 ENCOUNTER — Ambulatory Visit: Payer: Self-pay | Admitting: Internal Medicine

## 2008-06-09 ENCOUNTER — Ambulatory Visit: Payer: Self-pay | Admitting: Cardiology

## 2008-06-24 ENCOUNTER — Ambulatory Visit: Payer: Self-pay | Admitting: Internal Medicine

## 2008-07-08 ENCOUNTER — Ambulatory Visit: Payer: Self-pay | Admitting: Cardiology

## 2008-07-21 ENCOUNTER — Encounter: Admission: RE | Admit: 2008-07-21 | Discharge: 2008-07-21 | Payer: Self-pay | Admitting: Internal Medicine

## 2008-07-22 ENCOUNTER — Encounter (INDEPENDENT_AMBULATORY_CARE_PROVIDER_SITE_OTHER): Payer: Self-pay | Admitting: *Deleted

## 2008-07-22 ENCOUNTER — Ambulatory Visit: Payer: Self-pay | Admitting: Cardiology

## 2008-08-05 ENCOUNTER — Ambulatory Visit: Payer: Self-pay | Admitting: Cardiology

## 2008-08-26 ENCOUNTER — Ambulatory Visit: Payer: Self-pay | Admitting: Cardiology

## 2008-09-09 ENCOUNTER — Ambulatory Visit: Payer: Self-pay | Admitting: Cardiovascular Disease

## 2008-09-23 ENCOUNTER — Ambulatory Visit: Payer: Self-pay | Admitting: Internal Medicine

## 2008-09-23 ENCOUNTER — Ambulatory Visit: Payer: Self-pay | Admitting: Cardiovascular Disease

## 2008-09-24 ENCOUNTER — Encounter: Payer: Self-pay | Admitting: Internal Medicine

## 2008-10-14 ENCOUNTER — Ambulatory Visit: Payer: Self-pay | Admitting: Cardiology

## 2008-10-22 ENCOUNTER — Ambulatory Visit (HOSPITAL_COMMUNITY): Admission: RE | Admit: 2008-10-22 | Discharge: 2008-10-22 | Payer: Self-pay | Admitting: Internal Medicine

## 2008-11-11 ENCOUNTER — Ambulatory Visit: Payer: Self-pay | Admitting: Cardiology

## 2008-11-18 ENCOUNTER — Encounter: Payer: Self-pay | Admitting: *Deleted

## 2008-11-28 ENCOUNTER — Ambulatory Visit: Payer: Self-pay | Admitting: Cardiovascular Disease

## 2008-11-28 LAB — CONVERTED CEMR LAB
POC INR: 1.8
Protime: 16.5

## 2008-12-12 ENCOUNTER — Ambulatory Visit: Payer: Self-pay | Admitting: Cardiology

## 2008-12-16 ENCOUNTER — Telehealth: Payer: Self-pay | Admitting: Internal Medicine

## 2008-12-16 ENCOUNTER — Encounter: Payer: Self-pay | Admitting: Internal Medicine

## 2008-12-19 ENCOUNTER — Ambulatory Visit: Payer: Self-pay | Admitting: Internal Medicine

## 2008-12-23 LAB — CONVERTED CEMR LAB
CO2: 26 meq/L (ref 19–32)
Calcium: 10.3 mg/dL (ref 8.4–10.5)
Glucose, Bld: 101 mg/dL — ABNORMAL HIGH (ref 70–99)
Hemoglobin: 7.4 g/dL — CL (ref 12.0–15.0)
Lymphocytes Relative: 2 % — ABNORMAL LOW (ref 12.0–46.0)
MCV: 73.1 fL — ABNORMAL LOW (ref 78.0–100.0)
Monocytes Relative: 6 % (ref 3.0–12.0)
Pro B Natriuretic peptide (BNP): 152 pg/mL — ABNORMAL HIGH (ref 0.0–100.0)
RBC: 3.2 M/uL — ABNORMAL LOW (ref 3.87–5.11)
RDW: 16.6 % — ABNORMAL HIGH (ref 11.5–14.6)
Sodium: 140 meq/L (ref 135–145)
WBC: 9.9 10*3/uL (ref 4.5–10.5)

## 2008-12-24 ENCOUNTER — Encounter: Payer: Self-pay | Admitting: *Deleted

## 2008-12-29 ENCOUNTER — Encounter: Payer: Self-pay | Admitting: Internal Medicine

## 2008-12-31 ENCOUNTER — Ambulatory Visit: Payer: Self-pay | Admitting: Internal Medicine

## 2008-12-31 ENCOUNTER — Encounter: Payer: Self-pay | Admitting: Internal Medicine

## 2008-12-31 ENCOUNTER — Ambulatory Visit: Payer: Self-pay

## 2008-12-31 LAB — CONVERTED CEMR LAB: POC INR: 1.8

## 2009-01-02 ENCOUNTER — Telehealth (INDEPENDENT_AMBULATORY_CARE_PROVIDER_SITE_OTHER): Payer: Self-pay

## 2009-01-05 ENCOUNTER — Ambulatory Visit: Payer: Self-pay | Admitting: Internal Medicine

## 2009-01-05 DIAGNOSIS — R142 Eructation: Secondary | ICD-10-CM

## 2009-01-05 DIAGNOSIS — R143 Flatulence: Secondary | ICD-10-CM

## 2009-01-05 DIAGNOSIS — R141 Gas pain: Secondary | ICD-10-CM

## 2009-01-05 DIAGNOSIS — D500 Iron deficiency anemia secondary to blood loss (chronic): Secondary | ICD-10-CM | POA: Insufficient documentation

## 2009-01-07 ENCOUNTER — Telehealth (INDEPENDENT_AMBULATORY_CARE_PROVIDER_SITE_OTHER): Payer: Self-pay

## 2009-01-08 ENCOUNTER — Encounter (HOSPITAL_COMMUNITY): Admission: RE | Admit: 2009-01-08 | Discharge: 2009-03-13 | Payer: Self-pay | Admitting: Internal Medicine

## 2009-01-08 LAB — CONVERTED CEMR LAB
HCT: 27.1 % — ABNORMAL LOW (ref 36.0–46.0)
MCHC: 33 g/dL (ref 30.0–36.0)
MCV: 75.4 fL — ABNORMAL LOW (ref 78.0–100.0)
RDW: 22.2 % — ABNORMAL HIGH (ref 11.5–14.6)

## 2009-01-12 ENCOUNTER — Ambulatory Visit: Payer: Self-pay | Admitting: Internal Medicine

## 2009-01-13 ENCOUNTER — Telehealth: Payer: Self-pay | Admitting: Internal Medicine

## 2009-01-14 ENCOUNTER — Ambulatory Visit: Payer: Self-pay | Admitting: Cardiology

## 2009-01-14 LAB — CONVERTED CEMR LAB
POC INR: 2.1
Prothrombin Time: 17.8 s

## 2009-01-18 HISTORY — PX: UPPER GASTROINTESTINAL ENDOSCOPY: SHX188

## 2009-01-18 HISTORY — PX: COLONOSCOPY: SHX5424

## 2009-01-19 ENCOUNTER — Ambulatory Visit: Payer: Self-pay | Admitting: Internal Medicine

## 2009-01-19 ENCOUNTER — Encounter: Payer: Self-pay | Admitting: Internal Medicine

## 2009-01-19 ENCOUNTER — Ambulatory Visit (HOSPITAL_COMMUNITY): Admission: RE | Admit: 2009-01-19 | Discharge: 2009-01-19 | Payer: Self-pay | Admitting: Internal Medicine

## 2009-01-19 ENCOUNTER — Encounter (INDEPENDENT_AMBULATORY_CARE_PROVIDER_SITE_OTHER): Payer: Self-pay

## 2009-01-23 ENCOUNTER — Ambulatory Visit: Payer: Self-pay | Admitting: Cardiology

## 2009-01-23 ENCOUNTER — Ambulatory Visit: Payer: Self-pay | Admitting: Internal Medicine

## 2009-01-23 DIAGNOSIS — Z950 Presence of cardiac pacemaker: Secondary | ICD-10-CM

## 2009-01-30 ENCOUNTER — Ambulatory Visit: Payer: Self-pay | Admitting: Internal Medicine

## 2009-02-17 ENCOUNTER — Ambulatory Visit: Payer: Self-pay | Admitting: Cardiology

## 2009-02-17 LAB — CONVERTED CEMR LAB: POC INR: 2.9

## 2009-03-06 ENCOUNTER — Ambulatory Visit: Payer: Self-pay | Admitting: Internal Medicine

## 2009-03-06 LAB — CONVERTED CEMR LAB: POC INR: 2.9

## 2009-04-03 ENCOUNTER — Ambulatory Visit: Payer: Self-pay | Admitting: Internal Medicine

## 2009-04-03 LAB — CONVERTED CEMR LAB: POC INR: 2.4

## 2009-04-23 ENCOUNTER — Encounter: Admission: RE | Admit: 2009-04-23 | Discharge: 2009-04-23 | Payer: Self-pay | Admitting: Orthopedic Surgery

## 2009-04-23 ENCOUNTER — Ambulatory Visit: Payer: Self-pay | Admitting: Internal Medicine

## 2009-04-29 ENCOUNTER — Ambulatory Visit: Payer: Self-pay | Admitting: Internal Medicine

## 2009-05-01 ENCOUNTER — Encounter: Payer: Self-pay | Admitting: Internal Medicine

## 2009-05-01 ENCOUNTER — Ambulatory Visit: Payer: Self-pay | Admitting: Cardiology

## 2009-05-07 ENCOUNTER — Encounter: Payer: Self-pay | Admitting: Internal Medicine

## 2009-05-13 ENCOUNTER — Ambulatory Visit: Payer: Self-pay | Admitting: Internal Medicine

## 2009-05-27 ENCOUNTER — Ambulatory Visit: Payer: Self-pay | Admitting: Cardiology

## 2009-05-27 ENCOUNTER — Encounter: Payer: Self-pay | Admitting: Internal Medicine

## 2009-05-27 ENCOUNTER — Ambulatory Visit: Payer: Self-pay

## 2009-06-10 ENCOUNTER — Ambulatory Visit: Payer: Self-pay | Admitting: Internal Medicine

## 2009-06-10 LAB — CONVERTED CEMR LAB: POC INR: 2.5

## 2009-07-01 ENCOUNTER — Ambulatory Visit: Payer: Self-pay | Admitting: Internal Medicine

## 2009-07-01 LAB — CONVERTED CEMR LAB: POC INR: 2.3

## 2009-07-29 ENCOUNTER — Ambulatory Visit: Payer: Self-pay | Admitting: Cardiology

## 2009-07-29 LAB — CONVERTED CEMR LAB: POC INR: 2.2

## 2009-08-20 ENCOUNTER — Encounter: Admission: RE | Admit: 2009-08-20 | Discharge: 2009-08-20 | Payer: Self-pay | Admitting: Internal Medicine

## 2009-08-27 ENCOUNTER — Ambulatory Visit: Payer: Self-pay | Admitting: Cardiology

## 2009-08-27 LAB — CONVERTED CEMR LAB: POC INR: 2.1

## 2009-08-28 ENCOUNTER — Encounter (INDEPENDENT_AMBULATORY_CARE_PROVIDER_SITE_OTHER): Payer: Self-pay | Admitting: *Deleted

## 2009-08-28 ENCOUNTER — Encounter: Payer: Self-pay | Admitting: Internal Medicine

## 2009-08-31 ENCOUNTER — Ambulatory Visit: Payer: Self-pay | Admitting: Internal Medicine

## 2009-09-15 ENCOUNTER — Encounter: Payer: Self-pay | Admitting: Internal Medicine

## 2009-09-24 ENCOUNTER — Ambulatory Visit: Payer: Self-pay | Admitting: Cardiovascular Disease

## 2009-09-24 LAB — CONVERTED CEMR LAB: POC INR: 2.7

## 2009-09-29 ENCOUNTER — Ambulatory Visit: Payer: Self-pay | Admitting: Internal Medicine

## 2009-10-22 ENCOUNTER — Ambulatory Visit: Payer: Self-pay | Admitting: Cardiology

## 2009-10-22 LAB — CONVERTED CEMR LAB: POC INR: 2.1

## 2009-10-29 ENCOUNTER — Encounter: Payer: Self-pay | Admitting: Emergency Medicine

## 2009-11-13 ENCOUNTER — Telehealth (INDEPENDENT_AMBULATORY_CARE_PROVIDER_SITE_OTHER): Payer: Self-pay | Admitting: *Deleted

## 2009-11-19 ENCOUNTER — Ambulatory Visit: Payer: Self-pay | Admitting: Internal Medicine

## 2009-12-09 ENCOUNTER — Ambulatory Visit: Payer: Self-pay | Admitting: Cardiovascular Disease

## 2009-12-15 ENCOUNTER — Ambulatory Visit: Payer: Self-pay | Admitting: Cardiology

## 2009-12-29 ENCOUNTER — Ambulatory Visit: Payer: Self-pay | Admitting: Cardiovascular Disease

## 2010-01-05 ENCOUNTER — Encounter: Payer: Self-pay | Admitting: Internal Medicine

## 2010-01-12 ENCOUNTER — Ambulatory Visit: Payer: Self-pay | Admitting: Cardiology

## 2010-01-15 ENCOUNTER — Ambulatory Visit: Payer: Self-pay | Admitting: Internal Medicine

## 2010-01-20 ENCOUNTER — Ambulatory Visit: Payer: Self-pay | Admitting: Cardiology

## 2010-01-25 ENCOUNTER — Encounter: Payer: Self-pay | Admitting: Internal Medicine

## 2010-02-04 ENCOUNTER — Ambulatory Visit: Payer: Self-pay | Admitting: Cardiovascular Disease

## 2010-02-04 LAB — CONVERTED CEMR LAB: POC INR: 2.8

## 2010-02-26 ENCOUNTER — Ambulatory Visit: Payer: Self-pay | Admitting: Internal Medicine

## 2010-03-01 ENCOUNTER — Telehealth: Payer: Self-pay | Admitting: Internal Medicine

## 2010-03-02 ENCOUNTER — Ambulatory Visit (HOSPITAL_COMMUNITY): Admission: RE | Admit: 2010-03-02 | Discharge: 2010-03-02 | Payer: Self-pay | Admitting: Internal Medicine

## 2010-03-04 ENCOUNTER — Telehealth: Payer: Self-pay | Admitting: Internal Medicine

## 2010-03-05 ENCOUNTER — Ambulatory Visit: Payer: Self-pay | Admitting: Internal Medicine

## 2010-03-05 DIAGNOSIS — R0602 Shortness of breath: Secondary | ICD-10-CM

## 2010-03-12 LAB — CONVERTED CEMR LAB
CO2: 28 meq/L (ref 19–32)
Calcium: 9 mg/dL (ref 8.4–10.5)
Chloride: 103 meq/L (ref 96–112)
Potassium: 5.1 meq/L (ref 3.5–5.3)
Sodium: 138 meq/L (ref 135–145)

## 2010-03-15 ENCOUNTER — Ambulatory Visit (HOSPITAL_COMMUNITY): Admission: RE | Admit: 2010-03-15 | Discharge: 2010-03-15 | Payer: Self-pay | Admitting: Internal Medicine

## 2010-03-15 ENCOUNTER — Ambulatory Visit: Payer: Self-pay | Admitting: Internal Medicine

## 2010-03-15 ENCOUNTER — Encounter: Payer: Self-pay | Admitting: Internal Medicine

## 2010-03-15 ENCOUNTER — Ambulatory Visit: Payer: Self-pay

## 2010-03-16 ENCOUNTER — Ambulatory Visit: Payer: Self-pay | Admitting: Cardiovascular Disease

## 2010-03-31 ENCOUNTER — Telehealth: Payer: Self-pay | Admitting: Emergency Medicine

## 2010-04-01 ENCOUNTER — Ambulatory Visit: Payer: Self-pay | Admitting: Internal Medicine

## 2010-04-01 ENCOUNTER — Ambulatory Visit: Payer: Self-pay | Admitting: Emergency Medicine

## 2010-04-01 DIAGNOSIS — R635 Abnormal weight gain: Secondary | ICD-10-CM

## 2010-04-01 DIAGNOSIS — R197 Diarrhea, unspecified: Secondary | ICD-10-CM

## 2010-04-01 DIAGNOSIS — K219 Gastro-esophageal reflux disease without esophagitis: Secondary | ICD-10-CM | POA: Insufficient documentation

## 2010-04-01 DIAGNOSIS — R05 Cough: Secondary | ICD-10-CM

## 2010-04-01 DIAGNOSIS — J309 Allergic rhinitis, unspecified: Secondary | ICD-10-CM | POA: Insufficient documentation

## 2010-04-01 LAB — CONVERTED CEMR LAB: POC INR: 4.7

## 2010-04-02 LAB — CONVERTED CEMR LAB
Eosinophils Relative: 0.1 % (ref 0.0–5.0)
Ferritin: 17 ng/mL (ref 10.0–291.0)
HCT: 35.7 % — ABNORMAL LOW (ref 36.0–46.0)
Hemoglobin: 12.1 g/dL (ref 12.0–15.0)
Lymphs Abs: 0.9 10*3/uL (ref 0.7–4.0)
Monocytes Relative: 4.2 % (ref 3.0–12.0)
Platelets: 254 10*3/uL (ref 150.0–400.0)
WBC: 13 10*3/uL — ABNORMAL HIGH (ref 4.5–10.5)

## 2010-04-05 ENCOUNTER — Telehealth (INDEPENDENT_AMBULATORY_CARE_PROVIDER_SITE_OTHER): Payer: Self-pay | Admitting: Radiology

## 2010-04-06 ENCOUNTER — Encounter: Payer: Self-pay | Admitting: Cardiology

## 2010-04-06 ENCOUNTER — Encounter: Payer: Self-pay | Admitting: Internal Medicine

## 2010-04-06 ENCOUNTER — Ambulatory Visit: Payer: Self-pay | Admitting: Internal Medicine

## 2010-04-06 ENCOUNTER — Encounter (HOSPITAL_COMMUNITY)
Admission: RE | Admit: 2010-04-06 | Discharge: 2010-07-05 | Payer: Self-pay | Source: Home / Self Care | Attending: Internal Medicine | Admitting: Internal Medicine

## 2010-04-06 ENCOUNTER — Ambulatory Visit: Payer: Self-pay

## 2010-04-06 ENCOUNTER — Ambulatory Visit: Payer: Self-pay | Admitting: Cardiology

## 2010-04-07 ENCOUNTER — Ambulatory Visit (HOSPITAL_COMMUNITY): Admission: RE | Admit: 2010-04-07 | Discharge: 2010-04-07 | Payer: Self-pay | Admitting: Internal Medicine

## 2010-04-07 ENCOUNTER — Telehealth: Payer: Self-pay | Admitting: Internal Medicine

## 2010-04-08 ENCOUNTER — Telehealth (INDEPENDENT_AMBULATORY_CARE_PROVIDER_SITE_OTHER): Payer: Self-pay | Admitting: *Deleted

## 2010-04-12 ENCOUNTER — Ambulatory Visit: Payer: Self-pay | Admitting: Internal Medicine

## 2010-04-12 ENCOUNTER — Encounter: Admission: RE | Admit: 2010-04-12 | Discharge: 2010-04-12 | Payer: Self-pay | Admitting: Orthopedic Surgery

## 2010-04-22 ENCOUNTER — Encounter: Payer: Self-pay | Admitting: Internal Medicine

## 2010-04-27 ENCOUNTER — Encounter: Payer: Self-pay | Admitting: Internal Medicine

## 2010-05-03 ENCOUNTER — Ambulatory Visit: Payer: Self-pay | Admitting: Internal Medicine

## 2010-05-03 LAB — CONVERTED CEMR LAB: POC INR: 1.1

## 2010-05-04 ENCOUNTER — Ambulatory Visit: Payer: Self-pay | Admitting: Emergency Medicine

## 2010-05-10 ENCOUNTER — Inpatient Hospital Stay (HOSPITAL_COMMUNITY): Admission: EM | Admit: 2010-05-10 | Discharge: 2010-05-13 | Payer: Self-pay | Admitting: Emergency Medicine

## 2010-05-10 ENCOUNTER — Telehealth (INDEPENDENT_AMBULATORY_CARE_PROVIDER_SITE_OTHER): Payer: Self-pay | Admitting: *Deleted

## 2010-05-18 ENCOUNTER — Ambulatory Visit: Payer: Self-pay | Admitting: Cardiology

## 2010-05-18 LAB — CONVERTED CEMR LAB: POC INR: 3.5

## 2010-05-25 ENCOUNTER — Telehealth: Payer: Self-pay | Admitting: Emergency Medicine

## 2010-05-25 ENCOUNTER — Telehealth: Payer: Self-pay | Admitting: Internal Medicine

## 2010-05-26 ENCOUNTER — Ambulatory Visit: Payer: Self-pay | Admitting: Internal Medicine

## 2010-05-26 LAB — CONVERTED CEMR LAB: POC INR: 3.3

## 2010-06-10 ENCOUNTER — Ambulatory Visit: Payer: Self-pay | Admitting: Internal Medicine

## 2010-06-15 ENCOUNTER — Encounter: Payer: Self-pay | Admitting: Internal Medicine

## 2010-06-16 ENCOUNTER — Ambulatory Visit: Payer: Self-pay | Admitting: Cardiology

## 2010-06-29 ENCOUNTER — Encounter: Payer: Self-pay | Admitting: Internal Medicine

## 2010-06-30 ENCOUNTER — Encounter: Payer: Self-pay | Admitting: Internal Medicine

## 2010-07-01 ENCOUNTER — Ambulatory Visit: Admission: RE | Admit: 2010-07-01 | Discharge: 2010-07-01 | Payer: Self-pay | Source: Home / Self Care

## 2010-07-05 ENCOUNTER — Telehealth: Payer: Self-pay | Admitting: Internal Medicine

## 2010-07-08 ENCOUNTER — Ambulatory Visit: Admission: RE | Admit: 2010-07-08 | Discharge: 2010-07-08 | Payer: Self-pay | Source: Home / Self Care

## 2010-07-08 LAB — CONVERTED CEMR LAB: POC INR: 1.1

## 2010-07-10 ENCOUNTER — Encounter: Payer: Self-pay | Admitting: Oncology

## 2010-07-11 ENCOUNTER — Encounter: Payer: Self-pay | Admitting: Oncology

## 2010-07-11 ENCOUNTER — Encounter: Payer: Self-pay | Admitting: Orthopedic Surgery

## 2010-07-15 ENCOUNTER — Ambulatory Visit: Admission: RE | Admit: 2010-07-15 | Discharge: 2010-07-15 | Payer: Self-pay | Source: Home / Self Care

## 2010-07-20 NOTE — Medication Information (Signed)
Summary: rov/nb  Anticoagulant Therapy  Managed by: Weston Brass, PharmD Referring MD: Sharrell Ku, MD PCP: Joycelyn Rua Supervising MD: Ladona Ridgel MD, Sharlot Gowda Indication 1: Atrial Fibrillation (ICD-427.31) Lab Used: [facility] Site: [facility] INR POC 1.1 INR RANGE 2 - 3  Dietary changes: no    Health status changes: yes       Details: having injection today  Bleeding/hemorrhagic complications: no    Recent/future hospitalizations: no    Any changes in medication regimen? no    Recent/future dental: no  Any missed doses?: yes     Details: held x 4 days for spinal injection today  Is patient compliant with meds? yes       Allergies: 1)  ! Aspirin 2)  ! Ibuprofen 3)  ! Sulfa 4)  ! Infed (Iron Dextran) 5)  ! Reglan  Anticoagulation Management History:      The patient is taking warfarin and comes in today for a routine follow up visit.  Positive risk factors for bleeding include an age of 75 years or older and presence of serious comorbidities.  The bleeding index is 'intermediate risk'.  Positive CHADS2 values include History of CHF and History of HTN.  Negative CHADS2 values include Age > 55 years old.  The start date was 08/07/2001.  Her last INR was 5.8 RATIO.  Anticoagulation responsible provider: Ladona Ridgel MD, Sharlot Gowda.  INR POC: 1.1.  Cuvette Lot#: [id number].  Exp: 05/2011.    Anticoagulation Management Assessment/Plan:      The patient's current anticoagulation dose is Warfarin sodium 3 mg  tabs: use as directed by coumadin clinic.  The target INR is 2.0-3.0.  The next INR is due 05/12/2010.  Anticoagulation instructions were given to patient.  Results were reviewed/authorized by Weston Brass, PharmD.  She was notified by Weston Brass PharmD.         Prior Anticoagulation Instructions: INR 4.6  Skip today and tomorrow's dosage of Coumadin, then start taking 1 tablet daily except 1.5 tablets on Mondays.  Recheck in 10 days-2 weeks.    Current Anticoagulation  Instructions: INR 1.1  Take 1 1/2 tablets today and tomorrow then resume same dose of 1 tablet every day except 1 1/2 tablets on Monday.  Recheck INR in 10-14 days.

## 2010-07-20 NOTE — Medication Information (Signed)
Summary: rov/tm  Anticoagulant Therapy  Managed by: Weston Brass, PharmD Referring MD: Sharrell Ku PCP: Joycelyn Rua Supervising MD: Graciela Husbands MD, Viviann Spare Indication 1: Atrial Fibrillation (ICD-427.31) Lab Used: [facility] Site: [facility] INR POC 1.9 INR RANGE 2 - 3  Dietary changes: no    Health status changes: no    Bleeding/hemorrhagic complications: no    Recent/future hospitalizations: no    Any changes in medication regimen? yes       Details: Patient started gabapentin 100mg  three times daily, per Dr Delford Field ENT for chronic cough.  Recent/future dental: no  Any missed doses?: no       Is patient compliant with meds? yes       Allergies: 1)  ! Aspirin 2)  ! Ibuprofen 3)  ! Sulfa 4)  ! Infed (Iron Dextran) 5)  ! Reglan  Anticoagulation Management History:      The patient is taking warfarin and comes in today for a routine follow up visit.  Positive risk factors for bleeding include an age of 75 years or older and presence of serious comorbidities.  The bleeding index is 'intermediate risk'.  Positive CHADS2 values include History of CHF and History of HTN.  Negative CHADS2 values include Age > 76 years old.  The start date was 08/07/2001.  Her last INR was 5.8 RATIO.  Anticoagulation responsible provider: Graciela Husbands MD, Viviann Spare.  INR POC: 1.9.  Cuvette Lot#: [id number].  Exp: 01/2011.    Anticoagulation Management Assessment/Plan:      The patient's current anticoagulation dose is Warfarin sodium 3 mg  tabs: use as directed by coumadin clinic.  The target INR is 2.0-3.0.  The next INR is due 12/09/2009.  Anticoagulation instructions were given to patient.  Results were reviewed/authorized by Weston Brass, PharmD.  She was notified by [facility].         Prior Anticoagulation Instructions: INR 2.1 Continue 3mg s daily except 4.5mg s on Mondays, Wednesdays and Fridays. Recheck in 4 weeks.   Current Anticoagulation Instructions: INR-1.9 Take 1.5 tablets today 11/19/09  and  resume normal schedule. Take 1.5 tablets on Monday, Wednesday and friday and take 1 tablet on all other days. Return in 3 weeks.

## 2010-07-20 NOTE — Assessment & Plan Note (Signed)
Summary: ROV/SL/PT DID NOT WANT TO WAIT 6WKS/SL   Visit Type:  Follow-up Referring Yanelis Osika:  Ravissankar Avva,MD Primary Johathon Overturf:  Ravisankar Avva,MD   History of Present Illness: Ms. Valls returns today for followup.  She has a h/o CHB, s/p PPM, COPD with bronchiectasis, atrial fib on coumadin, and a h/o anemia.  When I saw her 3 months ago, she has been bothered by cough, worsening sob and fatigue.  She thought  she was having congestive heart failure. She had started on lasix and her cough has improved.  She has seen Dr. Delton Coombes.  No syncope or peripheral edema.  She does not feel palpitations. Overall she is much improved.  The patient has been diagnosed with vocal cord dystonia and has had a Botox injection.  Current Medications (verified): 1)  Qvar 40 Mcg/act Aers (Beclomethasone Dipropionate) .... Uad 2)  Rhinocort Aqua 32 Mcg/act  Susp (Budesonide) .... Inhale 1 Spray in Each Nostril Once Daily 3)  Trazodone Hcl 100 Mg Tabs (Trazodone Hcl) .... At Bedtime 4)  Cyclobenzaprine Hcl 10 Mg  Tabs (Cyclobenzaprine Hcl) .... Take 1 Tablet By Mouth Once A Day As Needed 5)  Lunesta 3 Mg  Tabs (Eszopiclone) .... Take One At Bedtime 6)  Epipen 0.3 Mg/0.22ml (1:1000)  Devi (Epinephrine Hcl (Anaphylaxis)) .... For Use As Needed 7)  Meloxicam 15 Mg  Tabs (Meloxicam) .... Take One Tablet Every Morning. 8)  Synthroid 50 Mcg  Tabs (Levothyroxine Sodium) .... Take One Tablet By Mouth Every Other Day Alternating With Synthroid 9)  Accolate 20 Mg  Tabs (Zafirlukast) .... Take One Tablet Two Times A Day 10)  Warfarin Sodium 3 Mg  Tabs (Warfarin Sodium) .... Use As Directed By Coumadin Clinic 11)  Ranitidine Hcl 150 Mg Tabs (Ranitidine Hcl) .... Take 2 Tablet At Qhs 12)  Simvastatin 10 Mg  Tabs (Simvastatin) .... Take One Tablet At Bedtime 13)  Prednisone 5 Mg Tabs (Prednisone) .... Take 1 Tablet By Mouth Once A Day 14)  Amlodipine Besylate 2.5 Mg  Tabs (Amlodipine Besylate) .... Take One Table At  Bedtime 15)  Aricept 10 Mg Tabs (Donepezil Hcl) .... Once Daily 16)  Ventolin Hfa 108 (90 Base) Mcg/act Aers (Albuterol Sulfate) .... 2 Puffs Q4h As Needed Sob 17)  Hydrocodone-Acetaminophen 5-500 Mg  Tabs (Hydrocodone-Acetaminophen) .... For Use As Needed 18)  Tussionex Pennkinetic Er 8-10 Mg/70ml  Lqcr (Chlorpheniramine-Hydrocodone) .... One Teaspoon Every 12 Hours As Needed. 19)  Synthroid 75 Mcg  Tabs (Levothyroxine Sodium) .... Take 1 Tab By Mouth Every Other Day Alternating With 50 Mcg 20)  Xolair 150 Mg  Solr (Omalizumab) .... Injections Every 4 Weeks 21)  Cymbalta 30 Mg  Cpep (Duloxetine Hcl) .... Take 3 Tabs By Mouth Daily 22)  Calcium +vitamin D 600mg  .... Take 1 Tablet By Mouth Two Times A Day 23)  One-A-Day Mvi .... Take 1 Tablet By Mouth Once A Day 24)  Stool Softner .... Take As Directed 25)  Nystatin 100000 Unit/ml Susp (Nystatin) .[name].. 1 Tbsp Nightly 26)  Nu-Iron 150 Mg Caps (Polysaccharide Iron Complex) .... One By Mouth Two Times A Day 27)  Vitamin D .... Weekly 28)  Atacand 16 Mg Tabs (Candesartan Cilexetil) .... Take One Tablet By Mouth Once Daily. 29)  Furosemide 40 Mg Tabs (Furosemide) .... Take As Directed 30)  Loratadine 10 Mg Tabs (Loratadine) .... Take One By Mouth Once Daily 31)  Saline Washes .... Do Once A Day 32)  Pantoprazole Sodium 40 Mg Tbec (Pantoprazole Sodium) .Marland KitchenMarland KitchenMarland Kitchen 1  By Mouth Once Daily 30-60 Minutes Before Breakfast 33)  Vitamin C 500 Mg Tabs (Ascorbic Acid) .... Qd  Allergies: 1)  ! Aspirin 2)  ! Ibuprofen 3)  ! Sulfa 4)  ! Infed (Iron Dextran) 5)  ! 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) HAMMER TOE (ICD-755.66) HIATAL HERNIA (ICD-553.3) INSOMNIA (ICD-780.52) OSTEOARTHRITIS (ICD-715.90) HYPOTHYROIDISM (ICD-244.9) HYPERTENSION (ICD-401.9) ATRIAL FIBRILLATION (ICD-427.31) ASTHMA (ICD-493.90) SCIATICA (ICD-724.3) HEPATIC CYST (ICD-573.8) NEOPLASM, MALIGNANT, BREAST (ICD-174.9)  history of  vocal cord dysfunction, and has been seen at the voice disorder Center at [facility] Anemia - chronic blood loss from AVM's (upper and colon - ablated 20100 Arrhythmia colon polyps COPD GERD Pneumonia Urinary Tract Infection Heart Failure 30%  Past Surgical History: Last updated: 01/05/2009 history of multiple sinus surgeries. Appendectomy Hernia Surgery Hysterectomy Pacemaker amputation of finger 6 eye surgeries  Review of Systems       The patient complains of dyspnea on exertion.  The patient denies chest pain, syncope, and peripheral edema.    Vital Signs:  Patient profile:   77 year old female Height:      65 inches Weight:      174 pounds BMI:     29.06 O2 Sat:      97 % Pulse rate:   111 / minute BP sitting:   140 / 70  (left arm)  Vitals Entered By: Laurance Flatten CMA (May 03, 2010 2:22 PM)  Physical Exam  General:  Well appearing NAD. Head:  normocephalic and atraumatic Eyes:  conjunctiva and sclera clear Mouth:  no deformity or lesions Neck:  mild UA noise on exp, hoarse voice Chest Wall:  Well healed PPM incision. Lungs:  Decreased  breath sounds with no wheezes, rales or rhonchi. Heart:  RRR with normal S1 and S2.  PMI is not enlarged or laterally displaced. Abdomen:  Bowel sounds positive; abdomen soft and non-tender without masses, organomegaly, or hernias noted. No hepatosplenomegaly. Msk:  no deformity or scoliosis noted with normal posture Pulses:  pulses normal in all 4 extremities Extremities:  trace edema blateral LE Neurologic:  Alert and oriented x 3.   PPM Specifications Following MD:  Lewayne Bunting, MD     PPM Vendor:  [employer]     PPM Model Number:  [id number]     PPM Serial Number:  [id number] PPM DOI:  07/20/2006      Lead 1    Location: [facility]     DOI: 07/20/2006     Model #: [id number]     Serial #: [id number]     Status: active Lead 2    Location: RV     DOI: 07/20/2006     Model #: [id number]     Serial #: [id number]     Status: active  Magnet  Response Rate:  BOL 85 ERI 65  Indications:  CHB  Explantation Comments:  pacemaker dependent [facility] Coumadin  PPM Follow Up Remote Check?  No Battery Voltage:  2.76 V     Battery Est. Longevity:  4.5 years     Pacer Dependent:  Yes       PPM Device Measurements Atrium  Amplitude: 2.0 mV, Impedance: 475 ohms, Threshold: 0.75 V at 0.4 msec Right Ventricle  Impedance: 562 ohms, Threshold: 0.875 V at 0.4 msec  Episodes MS Episodes:  122     Percent Mode Switch:  0.6%     Coumadin:  Yes Atrial Pacing:  4.3%     Ventricular Pacing:  99.7%  Parameters Mode:  DDD     Lower Rate Limit:  60     Upper Rate Limit:  130 Paced AV Delay:  190     Sensed AV Delay:  160 Next Remote Date:  08/05/2010     Next Cardiology Appt Due:  04/21/2011 Tech Comments:  No parameter changes.  Device function normal.  [facility] transmissions every 3 months.  ROV 1 year with Dr. Ladona Ridgel. Altha Harm, LPN  May 03, 2010 2:49 PM  MD Comments:  Agree with above.  Impression & Recommendations:  Problem # 1:  HEART FAILURE (ICD-428.9) Her symptoms are well controlled.  She will continue a low sodium diet (she admits to non-compliance) and will continue her lasix as needed.  Problem # 2:  CARDIAC PACEMAKER IN SITU (ICD-V45.01) Her device is working normally. Will recheck in several months.  Problem # 3:  HYPERTENSION (ICD-401.9) Her pressure is fairly well controlled despite sodium indiscretion. She will contiue meds as below. Her updated medication list for this problem includes:    Amlodipine Besylate 2.5 Mg Tabs (Amlodipine besylate) .[name]... Take one table at bedtime    Atacand 16 Mg Tabs (Candesartan cilexetil) .[name]... Take one tablet by mouth once daily.    Furosemide 40 Mg Tabs (Furosemide) .[name]... Take as directed  Patient Instructions: 1)  Your physician wants you to follow-up in:  6 months with Dr Court Joy will receive a reminder letter in the mail two months in advance. If you don't receive a  letter, please call our office to schedule the follow-up appointment.

## 2010-07-20 NOTE — Letter (Signed)
Summary: [facility] Medical Clearance   [facility] Medical Clearance   Imported By: Roderic Ovens 05/05/2010 12:36:06  _____________________________________________________________________  External Attachment:    Type:   Image     Comment:   External Document

## 2010-07-20 NOTE — Progress Notes (Signed)
Summary: swelling in ankles and left hand and heart racing/GT  Phone Note Call from Patient Call back at Home Phone [phone]   Caller: Patient Summary of Call: Heart racing swelling ankles and left hand Initial call taken by: Judie Grieve,  March 01, 2010 4:18 PM  Follow-up for Phone Call        started last month with swelling ankles, Was started on fluid pill by Dr Virgel Manifold on 02/19/10.  Taking Furosemide 20mg  daily.  As of today her hand is back to normal.  Feet are better but needs an appointment. Will send in transmission tonight. Dennis Bast, RN, BSN  March 01, 2010 5:59 PM Mr Gobert called today concerned that we had called very early this morning and he had missed the call. Assured him that we did not call.  He states that transmission sent last night and report was received. He stated he had no concerns for Dr. Ladona Ridgel today. Claris Gladden, RN  Additional Follow-up for Phone Call Additional follow up Details #1::        Transmission was sent and it showed she had 70 mode switches. Her HR has been averaging 100-120. Hystograms look about the same as 01/15/10 She had only had 23 mode switches at that check. Will get her an apt with Dr Rodman Key of 03/15/10 Dennis Bast, RN, BSN  March 03, 2010 3:30 PM

## 2010-07-20 NOTE — Cardiovascular Report (Signed)
Summary: Office Visit   Office Visit   Imported By: Roderic Ovens 03/08/2010 15:38:37  _____________________________________________________________________  External Attachment:    Type:   Image     Comment:   External Document

## 2010-07-20 NOTE — Progress Notes (Signed)
Summary: NUC PRE-PROCEDURE  Phone Note Outgoing Call   Call placed by: Domenic Polite, CNMT,  April 05, 2010 9:14 AM Call placed to: Patient Summary of Call: Reviewed information on [employer] Information Sheet (see scanned document for further details).  Spoke with patient's husband.  Initial call taken by: Domenic Polite, CNMT,  April 05, 2010 9:14 AM     Nuclear Med Background Indications for Stress Test: Evaluation for Ischemia  Indications Comments: HX A -FIB /CHF  History: Asthma, COPD, Echo, Pacemaker  History Comments: 9/11 echo EF= 30-35%  Symptoms: Fatigue, SOB    Nuclear Pre-Procedure Cardiac Risk Factors: Family History - CAD, History of Smoking, Hypertension, Lipids Height (in): 65  Nuclear Med Study Referring MD:  Sharrell Ku

## 2010-07-20 NOTE — Letter (Signed)
Summary: Device-Delinquent Phone Journalist, newspaper, Main Office  [address]   [place], [place] [address]   Phone: [phone]  Fax: [phone]     August 28, 2009 MRN: [id number]   Leonard Ikard [address] [place], [place]  [address]   Dear Ms. Beaudin,  According to our records, you were scheduled for a device phone transmission on       08/26/2009                       .     We did not receive any results from this check.  If you transmitted on your scheduled day, please call us to help troubleshoot your system.  If you forgot to send your transmission, please send one upon receipt of this letter.  Thank you,   Arch[facility]ural [facility]

## 2010-07-20 NOTE — Medication Information (Signed)
Summary: [name]  Anticoagulant Therapy  Managed by: Weston Brass, PharmD Referring MD: Sharrell Ku PCP: Joycelyn Rua Supervising MD: Riley Kill MD, Maisie Fus Indication 1: Atrial Fibrillation (ICD-427.31) Lab Used: [facility] Site: [facility] INR POC 1.0 INR RANGE 2 - 3  Dietary changes: yes       Details: Has been dieting past 3 days  Health status changes: yes       Details: Having spinal injection today  Bleeding/hemorrhagic complications: no    Recent/future hospitalizations: no    Any changes in medication regimen? no    Recent/future dental: no  Any missed doses?: yes     Details: Coumadin held for past 5 days for procedure  Is patient compliant with meds? no       Allergies: 1)  ! Aspirin 2)  ! Ibuprofen 3)  ! Sulfa 4)  ! Infed (Iron Dextran) 5)  ! Reglan  Anticoagulation Management History:      The patient is taking warfarin and comes in today for a routine follow up visit.  Positive risk factors for bleeding include an age of 75 years or older and presence of serious comorbidities.  The bleeding index is 'intermediate risk'.  Positive CHADS2 values include History of CHF and History of HTN.  Negative CHADS2 values include Age > 30 years old.  The start date was 08/07/2001.  Her last INR was 5.8 RATIO.  Anticoagulation responsible provider: Riley Kill MD, Maisie Fus.  INR POC: 1.0.  Cuvette Lot#: [id number].  Exp: 03/2011.    Anticoagulation Management Assessment/Plan:      The patient's current anticoagulation dose is Warfarin sodium 3 mg  tabs: use as directed by coumadin clinic.  The target INR is 2.0-3.0.  The next INR is due 01/28/2010.  Anticoagulation instructions were given to patient.  Results were reviewed/authorized by Weston Brass, PharmD.  She was notified by Liana Gerold, PharmD candidate.         Prior Anticoagulation Instructions: INR 2.3  Continue on same dosage 1 tablet daily except 1.5 tablets on Mondays, Wednesdays, and Fridays.  Recheck in 3-4  weeks.    Current Anticoagulation Instructions: INR 1.0  Take 2 tablets Wed. Aug 3 and Thu Aug 4.  Then resume with 1.5 tablets on Mon, Wed, Fri and 1 tablet on Tue, Thu, Sat and Sun.  Return to clinic in 1 week.

## 2010-07-20 NOTE — Assessment & Plan Note (Signed)
Summary: pc2 medtronic   Visit Type:  Follow-up Referring Provider:  Ravissankar Avva,MD Primary Provider:  Ravisankar Avva,MD   History of Present Illness: [name] returns today for followup.  She has a h/o CHB, s/p PPM, COPD with bronchiectasis, atrial fib on coumadin, and a h/o anemia.  Over the past few months, she has been bothered by cough, worsening sob and fatigue.  She thiks she has congestive heart failure. She has not had any syncope.    Current Medications (verified): 1)  Qvar 40 Mcg/act Aers (Beclomethasone Dipropionate) .... Uad 2)  Spiriva Handihaler 18 Mcg  Caps (Tiotropium Bromide Monohydrate) .... Use Once Daily 3)  Rhinocort Aqua 32 Mcg/act  Susp (Budesonide) .... Inhale 1 Spray in Each Nostril Once Daily 4)  Trazodone Hcl 100 Mg Tabs (Trazodone Hcl) .... At Bedtime 5)  Cyclobenzaprine Hcl 10 Mg  Tabs (Cyclobenzaprine Hcl) .... Take 1 Tablet By Mouth Once A Day As Needed 6)  Lunesta 3 Mg  Tabs (Eszopiclone) .... Take One At Bedtime 7)  Epipen 0.3 Mg/0.6ml (1:1000)  Devi (Epinephrine Hcl (Anaphylaxis)) .... For Use As Needed 8)  Meloxicam 15 Mg  Tabs (Meloxicam) .... Take One Tablet Every Morning. 9)  Synthroid 50 Mcg  Tabs (Levothyroxine Sodium) .... Take One Tablet By Mouth Every Other Day Alternating With Synthroid 10)  Accolate 20 Mg  Tabs (Zafirlukast) .... Take One Tablet Two Times A Day 11)  Warfarin Sodium 3 Mg  Tabs (Warfarin Sodium) .... Use As Directed By Coumadin Clinic 12)  Ranitidine Hcl 150 Mg Tabs (Ranitidine Hcl) .... Take 2 Tablet At Qhs 13)  Simvastatin 10 Mg  Tabs (Simvastatin) .... Take One Tablet At Bedtime 14)  Prednisone 5 Mg Tabs (Prednisone) .[name].. 1 1/2 By Mouth Once Daily 15)  Amlodipine Besylate 2.5 Mg  Tabs (Amlodipine Besylate) .... Take One Table At Bedtime 16)  Aricept 10 Mg Tabs (Donepezil Hcl) .... Once Daily 17)  Xopenex Hfa 45 Mcg/act  Aero (Levalbuterol Tartrate) .... Two Puffs Two Times A Day As Needed 18)  Xopenex 1.25 Mg/66ml   Nebu (Levalbuterol Hcl) .... Use 1 Vial in Nebulizer Every 4 To 6 Hours As Needed 19)  Hydrocodone-Acetaminophen 5-500 Mg  Tabs (Hydrocodone-Acetaminophen) .... For Use As Needed 20)  Tussionex Pennkinetic Er 8-10 Mg/51ml  Lqcr (Chlorpheniramine-Hydrocodone) .... One Teaspoon Every 12 Hours As Needed. 21)  Synthroid 75 Mcg  Tabs (Levothyroxine Sodium) .... Take 1 Tab By Mouth Every Other Day Alternating With 50 Mcg 22)  Xolair 150 Mg  Solr (Omalizumab) .... Injections Every 4 Weeks 23)  Cymbalta 30 Mg  Cpep (Duloxetine Hcl) .... Take 3 Tabs By Mouth Daily 24)  Calcium +vitamin D 600mg  .... Take 1 Tablet By Mouth Two Times A Day 25)  One-A-Day Mvi .... Take 1 Tablet By Mouth Once A Day 26)  Stool Softner .... Take As Directed 27)  Nystatin 100000 Unit/ml Susp (Nystatin) .[name].. 1 Tbsp Nightly 28)  Nu-Iron 150 Mg Caps (Polysaccharide Iron Complex) .... One By Mouth Two Times A Day 29)  Dexilant 30 Mg Cpdr (Dexlansoprazole) .... Take 1 Capsule Daily 30)  Vitamin D .... Weekly 31)  Atacand 16 Mg Tabs (Candesartan Cilexetil) .... Take One Tablet By Mouth Once Daily. 32)  Furosemide 20 Mg Tabs (Furosemide) .... Take One Tablet By Mouth Daily. As Needed  Allergies: 1)  ! Aspirin 2)  ! Ibuprofen 3)  ! Sulfa 4)  ! Infed (Iron Dextran) 5)  ! Reglan  Past History:  Past Medical History:  Last updated: 01/05/2009 HEART FAILURE (ICD-428.9) SARCOMA (ICD-199.1) HAMMER TOE (ICD-755.66) HIATAL HERNIA (ICD-553.3) INSOMNIA (ICD-780.52) OSTEOARTHRITIS (ICD-715.90) HYPOTHYROIDISM (ICD-244.9) HYPERTENSION (ICD-401.9) Hx of ATRIAL FIBRILLATION (ICD-427.31) ASTHMA (ICD-493.90) Hx of SCIATICA (ICD-724.3) Hx of HEPATIC CYST (ICD-573.8) SLEEP DISORDER, HX OF (ICD-V15.89) NEOPLASM, MALIGNANT, BREAST (ICD-174.9)  history of vocal cord dysfunction, and has been seen at the voice disorder Center at [facility] Anemia Arrhythmia colon polyps COPD GERD Pneumonia Urinary Tract Infection  Past Surgical  History: Last updated: 01/05/2009 history of multiple sinus surgeries. Appendectomy Hernia Surgery Hysterectomy Pacemaker amputation of finger 6 eye surgeries  Review of Systems       All systems reviewed and negative except as noted in the HPI.  Vital Signs:  Patient profile:   77 year old female Height:      65 inches Weight:      181 pounds BMI:     30.23 O2 Sat:      96 % Pulse rate:   94 / minute BP sitting:   114 / 60  (left arm)  Vitals Entered By: Laurance Flatten CMA (March 05, 2010 2:13 PM)  Physical Exam  General:  Well appearing NAD. Head:  normocephalic and atraumatic Eyes:  PERRLA/EOM intact; conjunctiva and lids normal. Neck:  Neck supple, no JVD. No masses, thyromegaly or abnormal cervical nodes. Chest Wall:  Well healed PPM incision. Lungs:  Decreased  breath sounds with no wheezes, rales or rhonchi. Heart:  RRR with normal S1 and S2.  PMI is not enlarged or laterally displaced. Abdomen:  Bowel sounds positive; abdomen soft and non-tender without masses, organomegaly, or hernias noted. No hepatosplenomegaly. Msk:  Back normal, normal gait. Muscle strength and tone normal. Pulses:  pulses normal in all 4 extremities Extremities:  No clubbing or cyanosis. Neurologic:  Alert and oriented x 3.   PPM Specifications Following MD:  Lewayne Bunting, MD     PPM Vendor:  Medtronic     PPM Model Number:  [phone]     PPM Serial Number:  [id number] PPM DOI:  07/20/2006      Lead 1    Location: RA     DOI: 07/20/2006     Model #: 9562     Serial #: [id number]     Status: active Lead 2    Location: RV     DOI: 07/20/2006     Model #: [id number]     Serial #: [id number]     Status: active  Magnet Response Rate:  BOL 85 ERI 65  Indications:  CHB  Explantation Comments:  pacemaker dependent Carelink Coumadin  PPM Follow Up Battery Voltage:  2.76 V     Battery Est. Longevity:  4.5 yrs     Pacer Dependent:  No       PPM Device Measurements Atrium  Amplitude: 4.00 mV,  Impedance: 526 ohms, Threshold: 0.750 V at 0.40 msec Right Ventricle  Amplitude: 15.68 mV, Impedance: 550 ohms, Threshold: 0.50 V at 0.40 msec  Episodes MS Episodes:  76     Percent Mode Switch:  <0.1%     Coumadin:  Yes Ventricular High Rate:  1     Atrial Pacing:  3.8%     Ventricular Pacing:  99.9%  Parameters Mode:  DDD     Lower Rate Limit:  60     Upper Rate Limit:  130 Paced AV Delay:  190     Sensed AV Delay:  160 Next Remote Date:  06/10/2010     Tech Comments:  76 MODE SWITCHES --LONGEST WAS 4 MINUTES AND 20 SECONDS.  NORMAL DEVICE FUNCTION.  CHANGED RA OUTPUT FROM 1.625 TO 2.00 V.  CARELINK CHECK 06-10-10. ROV IN 12 MTHS W/GT. Vella Kohler  March 05, 2010 2:18 PM MD Comments:  Agree with above.  Impression & Recommendations:  Problem # 1:  CARDIAC PACEMAKER IN SITU (ICD-V45.01) Her device is working normally.  Will recheck in several months.   Problem # 2:  SHORTNESS OF BREATH (ICD-786.05) This appears to be her main problem today.  I will repeat her 2D echo, check a BNP and refer her to our pulmonary group.  She may have devloped pulmonary HTN secondary to her underlying lung disease. She has a severe cough. Her updated medication list for this problem includes:    Amlodipine Besylate 2.5 Mg Tabs (Amlodipine besylate) .[name]... Take one table at bedtime    Atacand 16 Mg Tabs (Candesartan cilexetil) .[name]... Take one tablet by mouth once daily.    Furosemide 20 Mg Tabs (Furosemide) .[name]... Take one tablet by mouth daily. as needed  Orders: Pulmonary Referral (Pulmonary)  Problem # 3:  Hx of ATRIAL FIBRILLATION (ICD-427.31) Her PPM interogation demonstrates that her atrial fibrillation is infrequent. Her updated medication list for this problem includes:    Warfarin Sodium 3 Mg Tabs (Warfarin sodium) ..... Use as directed by coumadin clinic  Orders: Echocardiogram (Echo)  Patient Instructions: 1)  Your physician recommends that you schedule a follow-up appointment in:  3-5 weeks 2)  Your physician recommends that you continue on your current medications as directed. Please refer to the Current Medication list given to you today. 3)  Your physician has requested that you have an echocardiogram.  Echocardiography is a painless test that uses sound waves to create images of your heart. It provides your doctor with information about the size and shape of your heart and how well your heart's chambers and valves are working.  This procedure takes approximately one hour. There are no restrictions for this procedure. 4)  You have been referred to Dr Delton Coombes for increased SOB and bronchiectasis

## 2010-07-20 NOTE — Progress Notes (Signed)
Summary: Swelling legs and left arm  Phone Note Call from Patient Call back at Home Phone [phone] Call back at [phone]   Caller: Spouse/Larry Summary of Call: Pt husband request call pt has swelling legs and left arm Initial call taken by: Judie Grieve,  March 04, 2010 1:55 PM  Follow-up for Phone Call        apt on 03/05/10 at 1:45 with DrTaylor Dennis Bast, RN, BSN  March 04, 2010 5:13 PM

## 2010-07-20 NOTE — Progress Notes (Signed)
Summary: Coumadin Refill  Phone Note Call from Patient Call back at [phone]   Caller: Patient Reason for Call: Talk to Nurse Summary of Call: request to speak to coumadin clinic Initial call taken by: Migdalia Dk,  Nov 13, 2009 1:36 PM  Follow-up for Phone Call        Pt needs a refill.  [facility] to [facility] Follow-up by: Weston Brass PharmD,  Nov 13, 2009 1:43 PM    Prescriptions: WARFARIN SODIUM 3 MG  TABS (WARFARIN SODIUM) use as directed by coumadin clinic  #135 x 3   Entered by:   Weston Brass PharmD   Authorized by:   Laren Boom, MD, [facility]   Signed by:   Weston Brass PharmD on 11/13/2009   Method used:   [facility]ronically to        [facility] [phone]* (retail)       [address]       [place], [place]  [address]       Ph: [phone]       Fax: [phone]   RxID:   [id number]

## 2010-07-20 NOTE — Assessment & Plan Note (Signed)
Summary: [name]   Visit Type:  Follow-up Referring Provider:  Ravissankar Avva,MD Primary Provider:  Ravisankar Avva,MD   History of Present Illness: [name] returns today for followup.  She has a h/o CHB, s/p PPM, COPD with bronchiectasis, atrial fib on coumadin, and a h/o anemia.  When I saw her 2 months ago, she has been bothered by cough, worsening sob and fatigue.  She thought  she was having congestive heart failure. She had started on lasix and her cough has improved.  She has seen Dr. Delton [name].  No syncope or peripheral edema.  She does not feel palpitations.  Current Medications (verified): 1)  Qvar 40 Mcg/act Aers (Beclomethasone Dipropionate) .... Uad 2)  Rhinocort Aqua 32 Mcg/act  Susp (Budesonide) .... Inhale 1 Spray in Each Nostril Once Daily 3)  Trazodone Hcl 100 Mg Tabs (Trazodone Hcl) .... At Bedtime 4)  Cyclobenzaprine Hcl 10 Mg  Tabs (Cyclobenzaprine Hcl) .... Take 1 Tablet By Mouth Once A Day As Needed 5)  Lunesta 3 Mg  Tabs (Eszopiclone) .... Take One At Bedtime 6)  Epipen 0.3 Mg/0.34ml (1:1000)  Devi (Epinephrine Hcl (Anaphylaxis)) .... For Use As Needed 7)  Meloxicam 15 Mg  Tabs (Meloxicam) .... Take One Tablet Every Morning. 8)  Synthroid 50 Mcg  Tabs (Levothyroxine Sodium) .... Take One Tablet By Mouth Every Other Day Alternating With Synthroid 9)  Accolate 20 Mg  Tabs (Zafirlukast) .... Take One Tablet Two Times A Day 10)  Warfarin Sodium 3 Mg  Tabs (Warfarin Sodium) .... Use As Directed By Coumadin Clinic 11)  Ranitidine Hcl 150 Mg Tabs (Ranitidine Hcl) .... Take 2 Tablet At Qhs 12)  Simvastatin 10 Mg  Tabs (Simvastatin) .... Take One Tablet At Bedtime 13)  Prednisone 5 Mg Tabs (Prednisone) .[name].. 1 1/2 By Mouth Once Daily 14)  Amlodipine Besylate 2.5 Mg  Tabs (Amlodipine Besylate) .... Take One Table At Bedtime 15)  Aricept 10 Mg Tabs (Donepezil Hcl) .... Once Daily 16)  Ventolin Hfa 108 (90 Base) Mcg/act Aers (Albuterol Sulfate) .... 2 Puffs Q4h As  Needed Sob 17)  Hydrocodone-Acetaminophen 5-500 Mg  Tabs (Hydrocodone-Acetaminophen) .... For Use As Needed 18)  Tussionex Pennkinetic Er 8-10 Mg/72ml  Lqcr (Chlorpheniramine-Hydrocodone) .... One Teaspoon Every 12 Hours As Needed. 19)  Synthroid 75 Mcg  Tabs (Levothyroxine Sodium) .... Take 1 Tab By Mouth Every Other Day Alternating With 50 Mcg 20)  Xolair 150 Mg  Solr (Omalizumab) .... Injections Every 4 Weeks 21)  Cymbalta 30 Mg  Cpep (Duloxetine Hcl) .... Take 3 Tabs By Mouth Daily 22)  Calcium +vitamin D 600mg  .... Take 1 Tablet By Mouth Two Times A Day 23)  One-A-Day Mvi .... Take 1 Tablet By Mouth Once A Day 24)  Stool Softner .... Take As Directed 25)  Nystatin 100000 Unit/ml Susp (Nystatin) .[name].. 1 Tbsp Nightly 26)  Nu-Iron 150 Mg Caps (Polysaccharide Iron Complex) .... One By Mouth Two Times A Day 27)  Vitamin D .... Weekly 28)  Atacand 16 Mg Tabs (Candesartan Cilexetil) .... Take One Tablet By Mouth Once Daily. 29)  Furosemide 20 Mg Tabs (Furosemide) .... Take One Tablet By Mouth Daily. As Needed 30)  Loratadine 10 Mg Tabs (Loratadine) .... Take One By Mouth Once Daily 31)  Saline Washes .... Do Once A Day  Allergies (verified): 1)  ! Aspirin 2)  ! Ibuprofen 3)  ! Sulfa 4)  ! Infed (Iron Dextran) 5)  ! 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.1) HAMMER TOE (ICD-755.66) HIATAL HERNIA (ICD-553.3) INSOMNIA (ICD-780.52) OSTEOARTHRITIS (ICD-715.90) HYPOTHYROIDISM (ICD-244.9) HYPERTENSION (ICD-401.9) ATRIAL FIBRILLATION (ICD-427.31) ASTHMA (ICD-493.90) SCIATICA (ICD-724.3) HEPATIC CYST (ICD-573.8) NEOPLASM, MALIGNANT, BREAST (ICD-174.9)  history of vocal cord dysfunction, and has been seen at the voice disorder Center at [facility] Anemia - chronic blood loss from AVM's (upper and colon - ablated 20100 Arrhythmia colon polyps COPD GERD Pneumonia Urinary Tract Infection Heart Failure 30%  Past Surgical History: Last  updated: 01/05/2009 history of multiple sinus surgeries. Appendectomy Hernia Surgery Hysterectomy Pacemaker amputation of finger 6 eye surgeries  Review of Systems       The patient complains of dyspnea on exertion and prolonged cough.  The patient denies chest pain, syncope, and peripheral edema.    Vital Signs:  Patient profile:   77 year old female Height:      65 inches Weight:      175 pounds BMI:     29.23 Pulse rate:   72 / minute BP sitting:   146 / 74  (right arm)  Vitals Entered By: Laurance Flatten CMA (April 06, 2010 2:52 PM)  Physical Exam  General:  Well appearing NAD. Head:  normocephalic and atraumatic Eyes:  conjunctiva and sclera clear Mouth:  no deformity or lesions Neck:  mild UA noise on exp, hoarse voice Chest Wall:  Well healed PPM incision. Lungs:  Decreased  breath sounds with no wheezes, rales or rhonchi. Heart:  RRR with normal S1 and S2.  PMI is not enlarged or laterally displaced. Abdomen:  Bowel sounds positive; abdomen soft and non-tender without masses, organomegaly, or hernias noted. No hepatosplenomegaly. Msk:  no deformity or scoliosis noted with normal posture Pulses:  pulses normal in all 4 extremities Extremities:  trace edema blateral LE Neurologic:  short-term memory not completely intact   EKG  Procedure date:  04/06/2010  Findings:      Sinus tachycardia vs atrial tachycardia with rate of 130.  Ventricular tracking with some wenchebach is present.    PPM Specifications Following MD:  Miranda Bunting, MD     PPM Vendor:  Medtronic     PPM Model Number:  [phone]     PPM Serial Number:  [id number] PPM DOI:  07/20/2006      Lead 1    Location: RA     DOI: 07/20/2006     Model #: [id number]     Serial #: [id number]     Status: active Lead 2    Location: RV     DOI: 07/20/2006     Model #: 4034     Serial #: [id number]     Status: active  Magnet Response Rate:  BOL 85 ERI 65  Indications:  CHB  Explantation Comments:  pacemaker  dependent Carelink Coumadin  PPM Follow Up Remote Check?  No Battery Voltage:  2.76 V     Battery Est. Longevity:  4.5 years     Pacer Dependent:  Yes       PPM Device Measurements Atrium  Amplitude: 2.0 mV, Impedance: 552 ohms, Threshold: 0.75 V at 0.4 msec Right Ventricle  Amplitude: 15.68 mV, Impedance: 573 ohms, Threshold: 0.75 V at 0.4 msec  Episodes MS Episodes:  307     Percent Mode Switch:  1.7%     Coumadin:  Yes Ventricular High Rate:  0     Atrial Pacing:  4.6%     Ventricular Pacing:  99.7%  Parameters Mode:  DDD     Lower Rate Limit:  60     Upper Rate Limit:  130 Paced AV Delay:  190     Sensed AV Delay:  160 Next Remote Date:  07/08/2010     Next [name] Appt Due:  03/21/2011 Tech Comments:  No parameter changes.  Ventricular rates elevated.  Carelink transmissions every 3 months.  Altha Harm, LPN  April 06, 2010 3:17 PM  MD Comments:  Agree with above.  Impression & Recommendations:  Problem # 1:  SHORTNESS OF BREATH (ICD-786.05) While her symptoms are multi-factorial, she is clearly better with lasix.  Her cough and dyspnea have improved.  Will continue lasix and ask her to maintain a low sodium diet. Her updated medication list for this problem includes:    Amlodipine Besylate 2.5 Mg Tabs (Amlodipine besylate) .[name]... Take one table at bedtime    Atacand 16 Mg Tabs (Candesartan cilexetil) .[name]... Take one tablet by mouth once daily.    Furosemide 40 Mg Tabs (Furosemide) .[name]... Take as directed  Problem # 2:  CARDIAC PACEMAKER IN SITU (ICD-V45.01) Her device is working normally.  Will recheck in a few weeks.  Problem # 3:  Hx of ATRIAL FIBRILLATION (ICD-427.31) She appears to be in atrial tachy vs sinus tachy.  I have recommended a period of watchful waiting and I will see her back in a few weeks.   Her updated medication list for this problem includes:    Warfarin Sodium 3 Mg Tabs (Warfarin sodium) ..... Use as directed by coumadin clinic  Patient  Instructions: 1)  Your physician recommends that you schedule a follow-up appointment in: 6 weeks with Dr Ladona Ridgel Prescriptions: FUROSEMIDE 40 MG TABS (FUROSEMIDE) take as directed  #90 x 3   Entered by:   Dennis Bast, RN, BSN   Authorized by:   Laren Boom, MD, [facility]   Signed by:   Dennis Bast, RN, BSN on 04/06/2010   Method used:   [facility]ronically to        [facility] [phone]* (retail)       [address]       [place], [place]  [address]       Ph: [phone]       Fax: [phone]   RxID:   [id number]

## 2010-07-20 NOTE — Medication Information (Signed)
Summary: rov/ewj  Anticoagulant Therapy  Managed by: Miranda Reams, RN, BSN Referring MD: [name] PCP: [name] Supervising MD: Miranda Emms MD, [name] Indication 1: Atrial Fibrillation (ICD-427.31) Lab Used: [facility] Site: [facility] INR POC 4.7 INR RANGE 2 - 3  Dietary changes: no    Health status changes: yes       Details: GI upset, diarrhea x 2-3 times in the last couple weeks.   Bleeding/hemorrhagic complications: no    Recent/future hospitalizations: no    Any changes in medication regimen? yes       Details: added Claritin  Recent/future dental: no  Any missed doses?: no       Is patient compliant with meds? yes       Current Medications (verified): 1)  Qvar 40 Mcg/act Aers (Beclomethasone Dipropionate) .... Uad 2)  Rhinocort Aqua 32 Mcg/act  Susp (Budesonide) .... Inhale 1 Spray in Each Nostril Once Daily 3)  Trazodone Hcl 100 Mg Tabs (Trazodone Hcl) .... At Bedtime 4)  Cyclobenzaprine Hcl 10 Mg  Tabs (Cyclobenzaprine Hcl) .... Take 1 Tablet By Mouth Once A Day As Needed 5)  Lunesta 3 Mg  Tabs (Eszopiclone) .... Take One At Bedtime 6)  Epipen 0.3 Mg/0.84ml (1:1000)  Devi (Epinephrine Hcl (Anaphylaxis)) .... For Use As Needed 7)  Meloxicam 15 Mg  Tabs (Meloxicam) .... Take One Tablet Every Morning. 8)  Synthroid 50 Mcg  Tabs (Levothyroxine Sodium) .... Take One Tablet By Mouth Every Other Day Alternating With Synthroid 9)  Accolate 20 Mg  Tabs (Zafirlukast) .... Take One Tablet Two Times A Day 10)  Warfarin Sodium 3 Mg  Tabs (Warfarin Sodium) .... Use As Directed By Coumadin Clinic 11)  Ranitidine Hcl 150 Mg Tabs (Ranitidine Hcl) .... Take 2 Tablet At Qhs 12)  Simvastatin 10 Mg  Tabs (Simvastatin) .... Take One Tablet At Bedtime 13)  Prednisone 5 Mg Tabs (Prednisone) .[name].. 1 1/2 By Mouth Once Daily 14)  Amlodipine Besylate 2.5 Mg  Tabs (Amlodipine Besylate) .... Take One Table At Bedtime 15)  Aricept 10 Mg Tabs (Donepezil Hcl) .... Once Daily 16)   Ventolin Hfa 108 (90 Base) Mcg/act Aers (Albuterol Sulfate) .... 2 Puffs Q4h As Needed Sob 17)  Hydrocodone-Acetaminophen 5-500 Mg  Tabs (Hydrocodone-Acetaminophen) .... For Use As Needed 18)  Tussionex Pennkinetic Er 8-10 Mg/74ml  Lqcr (Chlorpheniramine-Hydrocodone) .... One Teaspoon Every 12 Hours As Needed. 19)  Synthroid 75 Mcg  Tabs (Levothyroxine Sodium) .... Take 1 Tab By Mouth Every Other Day Alternating With 50 Mcg 20)  Xolair 150 Mg  Solr (Omalizumab) .... Injections Every 4 Weeks 21)  Cymbalta 30 Mg  Cpep (Duloxetine Hcl) .... Take 3 Tabs By Mouth Daily 22)  Calcium +vitamin D 600mg  .... Take 1 Tablet By Mouth Two Times A Day 23)  One-A-Day Mvi .... Take 1 Tablet By Mouth Once A Day 24)  Stool Softner .... Take As Directed 25)  Nystatin 100000 Unit/ml Susp (Nystatin) .[name].. 1 Tbsp Nightly 26)  Nu-Iron 150 Mg Caps (Polysaccharide Iron Complex) .... One By Mouth Two Times A Day 27)  Vitamin D .... Weekly 28)  Atacand 16 Mg Tabs (Candesartan Cilexetil) .... Take One Tablet By Mouth Once Daily. 29)  Furosemide 20 Mg Tabs (Furosemide) .... Take One Tablet By Mouth Daily. As Needed 30)  Loratadine 10 Mg Tabs (Loratadine) .... Take One By Mouth Once Daily 31)  Saline Washes .... Do Once A Day  Allergies: 1)  ! Aspirin 2)  ! Ibuprofen 3)  !  Sulfa 4)  ! Infed (Iron Dextran) 5)  ! Reglan  Anticoagulation Management History:      The patient is taking warfarin and comes in today for a routine follow up visit.  Positive risk factors for bleeding include an age of 75 years or older and presence of serious comorbidities.  The bleeding index is 'intermediate risk'.  Positive CHADS2 values include History of CHF and History of HTN.  Negative CHADS2 values include Age > 75 years old.  The start date was 08/07/2001.  Her last INR was 5.8 RATIO.  Anticoagulation responsible provider: Eden Emms MD, [name].  INR POC: 4.7.  Exp: 04/2011.    Anticoagulation Management Assessment/Plan:      The patient's  current anticoagulation dose is Warfarin sodium 3 mg  tabs: use as directed by coumadin clinic.  The target INR is 2.0-3.0.  The next INR is due 04/14/2010.  Anticoagulation instructions were given to patient.  Results were reviewed/authorized by Miranda Reams, RN, BSN.  She was notified by Miranda Reams RN.         Prior Anticoagulation Instructions: INR 1.1  Resume previous dosage after spinal injection today, 1 tablet daily except 1.5 tablets on Mondays, Wednesdays, and Fridays.  Recheck in 1 week-10 days.   Current Anticoagulation Instructions: INR 4.7  Skip today and tomorrow's dosage of Coumadin, then resume same dosage 1 tablet daily except 1.5 tablets on Mondays, Wednesdays, and Fridays.  Recheck in 10 days.

## 2010-07-20 NOTE — Progress Notes (Signed)
Summary: FYI  Phone Note Call from Patient Call back at [phone]   Caller: Spouse//larry Call For: parrett Summary of Call: [id number] appt for today because he is taking pt to [facility] er. Initial call taken by: Darletta Moll,  May 10, 2010 1:33 PM  Follow-up for Phone Call        Spoke with pt's spouse.  He states that pt has fever of 102.8 and incresed SOB and cough so he took her to the ER at [facility].  Will forward this to RB so that he is aware. Follow-up by: Vernie Murders,  May 10, 2010 2:17 PM

## 2010-07-20 NOTE — Medication Information (Signed)
Summary: rov/tm  Anticoagulant Therapy  Managed by: Cloyde Reams, RN, BSN Referring MD: Sharrell Ku PCP: Joycelyn Rua Supervising MD: Eden Emms MD, Theron Arista Indication 1: Atrial Fibrillation (ICD-427.31) Lab Used: [facility] Site: [facility] INR POC 1.1 INR RANGE 2 - 3  Dietary changes: no    Health status changes: no    Bleeding/hemorrhagic complications: no    Recent/future hospitalizations: no    Any changes in medication regimen? no    Recent/future dental: no  Any missed doses?: yes     Details: Off Coumadin x 5 days, discontinued for epidural injection today.   Is patient compliant with meds? yes       Allergies: 1)  ! Aspirin 2)  ! Ibuprofen 3)  ! Sulfa 4)  ! Infed (Iron Dextran) 5)  ! Reglan  Anticoagulation Management History:      The patient is taking warfarin and comes in today for a routine follow up visit.  Positive risk factors for bleeding include an age of 75 years or older and presence of serious comorbidities.  The bleeding index is 'intermediate risk'.  Positive CHADS2 values include History of CHF and History of HTN.  Negative CHADS2 values include Age > 59 years old.  The start date was 08/07/2001.  Her last INR was 5.8 RATIO.  Anticoagulation responsible provider: Eden Emms MD, Theron Arista.  INR POC: 1.1.  Cuvette Lot#: [id number].  Exp: 04/2011.    Anticoagulation Management Assessment/Plan:      The patient's current anticoagulation dose is Warfarin sodium 3 mg  tabs: use as directed by coumadin clinic.  The target INR is 2.0-3.0.  The next INR is due 04/01/2010.  Anticoagulation instructions were given to patient.  Results were reviewed/authorized by Cloyde Reams, RN, BSN.  She was notified by Cloyde Reams RN.         Prior Anticoagulation Instructions: INR 2.9  Continue taking 1 tablet everyday except take 1 1/2 tablets on Mondays, Wednesdays, and Fridays. Re-check INR in 3 weeks.   Current Anticoagulation Instructions: INR 1.1  Resume previous  dosage after spinal injection today, 1 tablet daily except 1.5 tablets on Mondays, Wednesdays, and Fridays.  Recheck in 1 week-10 days.

## 2010-07-20 NOTE — Letter (Signed)
Summary: Device-Delinquent Phone Journalist, newspaper, Main Office  [address]   [place], [place] [address]   Phone: [phone]  Fax: [phone]     August 28, 2009 MRN: [id number]   [place] Cindric [address] [place], [place]  [address]   Dear Ms. Degner,  According to our records, you were scheduled for a device phone transmission on                              .     We did not receive any results from this check.  If you transmitted on your scheduled day, please call us to help troubleshoot your system.  If you forgot to send your transmission, please send one upon receipt of this letter.  Thank you,   [facility]

## 2010-07-20 NOTE — Letter (Signed)
Summary: [facility] E[facility]   [facility] E[facility]   Imported By: Roderic Ovens 04/07/2010 10:47:39  _____________________________________________________________________  External Attachment:    Type:   Image     Comment:   External Document

## 2010-07-20 NOTE — Medication Information (Signed)
Summary: rov/tm  Anticoagulant Therapy  Managed by: Bethena Midget, RN, BSN Referring MD: Sharrell Ku PCP: Joycelyn Rua Supervising MD: Jens Som MD, Arlys John Indication 1: Atrial Fibrillation (ICD-427.31) Lab Used: [facility] Site: [facility] INR POC 2.2 INR RANGE 2 - 3  Dietary changes: no    Health status changes: no    Bleeding/hemorrhagic complications: yes       Details: has seen more bruises on forearms  Recent/future hospitalizations: no    Any changes in medication regimen? yes       Details: Received Prednisone injection on 07/17/09 into buttocks for asthma flare.  Recent/future dental: no  Any missed doses?: no       Is patient compliant with meds? yes      Comments: Pt. verbalized she since last visit has only taken 4.5mg s on Mondays and Fridays. Per Ellin Mayhew she can return to previous dose on 4.5mg s on MWF/3mg  daily.   Current Medications (verified): 1)  Symbicort 160-4.5 Mcg/act  Aero (Budesonide-Formoterol Fumarate) .... Use Two Puffs Two Times A Day 2)  Spiriva Handihaler 18 Mcg  Caps (Tiotropium Bromide Monohydrate) .... Use Once Daily 3)  Rhinocort Aqua 32 Mcg/act  Susp (Budesonide) .... Inhale 1 Spray in Each Nostril Once Daily 4)  Trazodone Hcl 50 Mg  Tabs (Trazodone Hcl) .... Take 1 To 2 Tabs By Mouth At Bedtime 5)  Cyclobenzaprine Hcl 10 Mg  Tabs (Cyclobenzaprine Hcl) .... Take 1 Tablet By Mouth Once A Day As Needed 6)  Lunesta 3 Mg  Tabs (Eszopiclone) .... Take One At Bedtime 7)  Epipen 0.3 Mg/0.6ml (1:1000)  Devi (Epinephrine Hcl (Anaphylaxis)) .... For Use As Needed 8)  Meloxicam 15 Mg  Tabs (Meloxicam) .... Take One Tablet Every Morning. 9)  Synthroid 50 Mcg  Tabs (Levothyroxine Sodium) .... Take One Tablet By Mouth Every Other Day Alternating With Synthroid 10)  Accolate 20 Mg  Tabs (Zafirlukast) .... Take One Tablet Two Times A Day 11)  Warfarin Sodium 3 Mg  Tabs (Warfarin Sodium) .... Use As Directed By Coumadin  Clinic 12)  Ranitidine Hcl 150 Mg Tabs (Ranitidine Hcl) .... Take 2 Tablet At Qhs 13)  Simvastatin 10 Mg  Tabs (Simvastatin) .... Take One Tablet At Bedtime 14)  Prednisone 5 Mg Tabs (Prednisone) .[name].. 1 1/2 By Mouth Once Daily 15)  Amlodipine Besylate 2.5 Mg  Tabs (Amlodipine Besylate) .... Take One Table At Bedtime 16)  Aricept 5 Mg  Tabs (Donepezil Hcl) .... Tak 2  Tablet Daily. 17)  Xopenex Hfa 45 Mcg/act  Aero (Levalbuterol Tartrate) .... Two Puffs Two Times A Day As Needed 18)  Xopenex 1.25 Mg/60ml  Nebu (Levalbuterol Hcl) .... Use 1 Vial in Nebulizer Every 4 To 6 Hours As Needed 19)  Hydrocodone-Acetaminophen 5-500 Mg  Tabs (Hydrocodone-Acetaminophen) .... For Use As Needed 20)  Tussionex Pennkinetic Er 8-10 Mg/31ml  Lqcr (Chlorpheniramine-Hydrocodone) .... One Teaspoon Every 12 Hours As Needed. 21)  Synthroid 75 Mcg  Tabs (Levothyroxine Sodium) .... Take 1 Tab By Mouth Every Other Day Alternating With 50 Mcg 22)  Xolair 150 Mg  Solr (Omalizumab) .... Injections Every 4 Weeks 23)  Cymbalta 30 Mg  Cpep (Duloxetine Hcl) .... Take 3 Tabs By Mouth Daily 24)  Calcium +vitamin D 600mg  .... Take 1 Tablet By Mouth Two Times A Day 25)  One-A-Day Mvi .... Take 1 Tablet By Mouth Once A Day 26)  Dulcolax 5 Mg Tbec (Bisacodyl) .... Take As Directed 27)  Nystatin 100000 Unit/ml Susp (Nystatin) .Marland KitchenMarland KitchenMarland Kitchen  1 Tbsp Nightly 28)  Nu-Iron 150 Mg Caps (Polysaccharide Iron Complex) .... One By Mouth Two Times A Day 29)  Dexilant 30 Mg Cpdr (Dexlansoprazole) .... Take 1 Capsule Daily  Allergies: 1)  ! Aspirin 2)  ! Ibuprofen 3)  ! Sulfa 4)  ! Infed (Iron Dextran) 5)  ! Reglan  Anticoagulation Management History:      The patient is taking warfarin and comes in today for a routine follow up visit.  Positive risk factors for bleeding include an age of 75 years or older and presence of serious comorbidities.  The bleeding index is 'intermediate risk'.  Positive CHADS2 values include History of CHF and History of HTN.   Negative CHADS2 values include Age > 65 years old.  The start date was 08/07/2001.  Her last INR was 5.8 RATIO.  Anticoagulation responsible provider: Jens Som MD, Arlys John.  INR POC: 2.2.  Cuvette Lot#: [id number].  Exp: 09/2010.    Anticoagulation Management Assessment/Plan:      The patient's current anticoagulation dose is Warfarin sodium 3 mg  tabs: use as directed by coumadin clinic.  The target INR is 2.0-3.0.  The next INR is due 08/26/2009.  Anticoagulation instructions were given to patient.  Results were reviewed/authorized by Bethena Midget, RN, BSN.  She was notified by Bethena Midget, RN, BSN.         Prior Anticoagulation Instructions: INR 2.3 Continue 3mg s daily except 4.5mg s on Mondays, Wednesdays and Fridays. Recheck in 4 weeks.   Current Anticoagulation Instructions: INR 2.2 Continue 3mg s everyday except 4.5mg s on Mondays, Wednesdays and Fridays. Recheck in 4 weeks.

## 2010-07-20 NOTE — Cardiovascular Report (Signed)
Summary: Office Visit Remote   Office Visit Remote   Imported By: Roderic Ovens 01/25/2010 14:52:00  _____________________________________________________________________  External Attachment:    Type:   Image     Comment:   External Document

## 2010-07-20 NOTE — Medication Information (Signed)
Summary: rov/kb  Anticoagulant Therapy  Managed by: Elaina Pattee, PharmD Referring MD: Sharrell Ku PCP: Joycelyn Rua Supervising MD: Eden Emms MD, Theron Arista Indication 1: Atrial Fibrillation (ICD-427.31) Lab Used: [facility] Site: [facility] INR POC 2.5 INR RANGE 2 - 3  Dietary changes: no    Health status changes: no    Bleeding/hemorrhagic complications: no    Recent/future hospitalizations: yes       Details: Spinal injection 12/28/09 at 3:15. Will be off Coumadin 5 days prior to procedure.   Any changes in medication regimen? no    Recent/future dental: no  Any missed doses?: yes     Details: Missed last Thursday's dose.  Is patient compliant with meds? yes       Allergies: 1)  ! Aspirin 2)  ! Ibuprofen 3)  ! Sulfa 4)  ! Infed (Iron Dextran) 5)  ! Reglan  Anticoagulation Management History:      The patient is taking warfarin and comes in today for a routine follow up visit.  Positive risk factors for bleeding include an age of 75 years or older and presence of serious comorbidities.  The bleeding index is 'intermediate risk'.  Positive CHADS2 values include History of CHF and History of HTN.  Negative CHADS2 values include Age > 76 years old.  The start date was 08/07/2001.  Her last INR was 5.8 RATIO.  Anticoagulation responsible provider: Eden Emms MD, Theron Arista.  INR POC: 2.5.  Cuvette Lot#: [id number].  Exp: 01/2011.    Anticoagulation Management Assessment/Plan:      The patient's current anticoagulation dose is Warfarin sodium 3 mg  tabs: use as directed by coumadin clinic.  The target INR is 2.0-3.0.  The next INR is due 12/28/2009.  Anticoagulation instructions were given to patient.  Results were reviewed/authorized by Elaina Pattee, PharmD.  She was notified by Elaina Pattee, PharmD.         Prior Anticoagulation Instructions: INR-1.9 Take 1.5 tablets today 11/19/09 and  resume normal schedule. Take 1.5 tablets on Monday, Wednesday and friday and take 1 tablet on  all other days. Return in 3 weeks.  Current Anticoagulation Instructions: INR 2.5. Take 1 tablet daily except 1.5 tablets on Mon, Wed, Fri.   Take your last dose of Coumadin on 12/22/09. Hold Coumadin until after injection 12/28/09. Recheck on 12/28/09 prior to injection.

## 2010-07-20 NOTE — Medication Information (Signed)
Summary: rov/sl  Anticoagulant Therapy  Managed by: Eda Keys, PharmD Referring MD: Sharrell Ku PCP: Joycelyn Rua Supervising MD: Tenny Craw MD, Gunnar Fusi Indication 1: Atrial Fibrillation (ICD-427.31) Lab Used: [facility] Site: [facility] INR RANGE 2 - 3  Dietary changes: no    Health status changes: yes       Details: Is having some lung issues  Bleeding/hemorrhagic complications: no    Recent/future hospitalizations: no    Any changes in medication regimen? yes       Details: Lasix 20 mg prn  Recent/future dental: no  Any missed doses?: no       Is patient compliant with meds? yes       Allergies: 1)  ! Aspirin 2)  ! Ibuprofen 3)  ! Sulfa 4)  ! Infed (Iron Dextran) 5)  ! Reglan  Anticoagulation Management History:      The patient is taking warfarin and comes in today for a routine follow up visit.  Positive risk factors for bleeding include an age of 75 years or older and presence of serious comorbidities.  The bleeding index is 'intermediate risk'.  Positive CHADS2 values include History of CHF and History of HTN.  Negative CHADS2 values include Age > 29 years old.  The start date was 08/07/2001.  Her last INR was 5.8 RATIO.  Anticoagulation responsible provider: Tenny Craw MD, Gunnar Fusi.  Cuvette Lot#: [id number].  Exp: 04/2011.    Anticoagulation Management Assessment/Plan:      The patient's current anticoagulation dose is Warfarin sodium 3 mg  tabs: use as directed by coumadin clinic.  The target INR is 2.0-3.0.  The next INR is due 03/15/2010.  Anticoagulation instructions were given to patient.  Results were reviewed/authorized by Eda Keys, PharmD.  She was notified by Harrel Carina, PharmD candidate.         Prior Anticoagulation Instructions: INR 2.8  Continue taking Coumadin 1 tab (3 mg) on Sun, Tue, Thur, Sat and Coumadin 1.5 tabs (4.5 mg) on Mon, Wed, Fri. Return to clinic in 2 weeks.   Current Anticoagulation Instructions: INR 2.9  Continue  taking 1 tablet everyday except take 1 1/2 tablets on Mondays, Wednesdays, and Fridays. Re-check INR in 3 weeks.

## 2010-07-20 NOTE — Medication Information (Signed)
Summary: rov/eac  Anticoagulant Therapy  Managed by: Cloyde Reams, RN, BSN Referring MD: Sharrell Ku PCP: Joycelyn Rua Supervising MD: Eden Emms MD, Theron Arista Indication 1: Atrial Fibrillation (ICD-427.31) Lab Used: [facility] Site: [facility] INR POC 2.7 INR RANGE 2 - 3  Dietary changes: no    Health status changes: yes       Details: Sick x 3 weeks.    Bleeding/hemorrhagic complications: no    Recent/future hospitalizations: no    Any changes in medication regimen? yes       Details: Incr cortisone x 3 weeks ago 15mg  daily.  Currently on abx x 2.5 weeks Azithromycin 3 tablets weekly.    Recent/future dental: no  Any missed doses?: no       Is patient compliant with meds? yes       Allergies: 1)  ! Aspirin 2)  ! Ibuprofen 3)  ! Sulfa 4)  ! Infed (Iron Dextran) 5)  ! Reglan  Anticoagulation Management History:      The patient is taking warfarin and comes in today for a routine follow up visit.  Positive risk factors for bleeding include an age of 75 years or older and presence of serious comorbidities.  The bleeding index is 'intermediate risk'.  Positive CHADS2 values include History of CHF and History of HTN.  Negative CHADS2 values include Age > 12 years old.  The start date was 08/07/2001.  Her last INR was 5.8 RATIO.  Anticoagulation responsible provider: Eden Emms MD, Theron Arista.  INR POC: 2.7.  Cuvette Lot#: [id number].  Exp: 10/2010.    Anticoagulation Management Assessment/Plan:      The patient's current anticoagulation dose is Warfarin sodium 3 mg  tabs: use as directed by coumadin clinic.  The target INR is 2.0-3.0.  The next INR is due 10/22/2009.  Anticoagulation instructions were given to patient.  Results were reviewed/authorized by Cloyde Reams, RN, BSN.  She was notified by Cloyde Reams RN.         Prior Anticoagulation Instructions: INR 2.1  Continue current dosing schedule of 1.5 tablets on Monday, Wednesday, and Friday and 1 tablet all other days.   Return to clinic in 4 weeks.   Current Anticoagulation Instructions: INR 2.7  Continue on same dosage 1 tablet daily except 1.5 tablets on Mondays, Wednesdays, and Fridays.  Recheck in 4 weeks.

## 2010-07-20 NOTE — Progress Notes (Signed)
Summary: [place] no ascites  Phone Note Outgoing Call   Summary of Call: let her know [place] ok weight gain is real and not fluid in abdomen - must be eating better ask her if still having diarrhea off dexilant Iva Boop MD, [facility]  April 07, 2010 3:04 PM   Follow-up for Phone Call        Notified pt of results and advised weight gain is real.  Pt says she is eating.  Pt has not had diarrhea since stopping Dexilant.  She continues with ranitidine.  She still has bloating.  Does pt need to be a PPI.  She stated you commented that she may not need. 2) Pt also asks about iron supplement.  Pt wants to know if she can have an rx for insurance reasons.  Nu-Iron and Ferex are OTC and not covered by her insurance. Please advise about PPI, bloating and iron. Follow-up by: Francee Piccolo CMA [facility] Dull),  April 07, 2010 5:31 PM  Additional Follow-up for Phone Call Additional follow up Details #1::        It appears that no oral iron is covered by insurance  stay off Dexilant and take pantoprazole 40 mg daily and stay on ranitidine  Avoid gas-producing foods re: bloating, send info if needed   Additional Follow-up by: Iva Boop MD, Clementeen Graham,  April 14, 2010 7:16 PM    Additional Follow-up for Phone Call Additional follow up Details #2::    spoke to pt briefly, she is on her way out the door and will call me back tomorrow. Francee Piccolo CMA [facility] Dull)  April 15, 2010 2:40 PM   Call to pt.  We spoke for 12 minutes regarding the above.  Pt has started the pantoprazole-she took one pill-and had abd pain.  The day before this she took 40mg  lasix and tylenol with codeine.  She believes the lasix may have contributed to the way she felt on Sunday.  She will try pantoprazole again next week-she has things going on this week, and call me with an update. Follow-up by: Stephanie Phillips CMA (AAMA),  April 20, 2010 4:30 PM  New/Updated Medications: PANTOPRAZOLE SODIUM 40 MG TBEC (PANTOPRAZOLE  SODIUM) 1 by mouth once daily 30-60 minutes before breakfast Prescriptions: PANTOPRAZOLE SODIUM 40 MG TBEC (PANTOPRAZOLE SODIUM) 1 by mouth once daily 30-60 minutes before breakfast  #30 x 11   Entered and Authorized by:   Carl E Gessner MD, FACG   Signed by:   Carl E Gessner MD, FACG on 04/14/2010   Method used:   Electronically to        [id number] ([facility])       [address]       [place], [place]  [address]       Ph: [phone]       Fax: [phone]   RxID:   [id number]

## 2010-07-20 NOTE — Letter (Signed)
Summary: Device-Delinquent Phone Journalist, newspaper, Main Office  [address]   [place], [place] [address]   Phone: [phone]  Fax: [phone]     August 28, 2009 MRN: [id number]   [place] Novitsky [address] [place], [place]  [address]   Dear Ms. Khokhar,  According to our records, you were scheduled for a device phone transmission on  08/26/2009                            .     We did not receive any results from this check.  If you transmitted on your scheduled day, please call us to help troubleshoot your system.  If you forgot to send your transmission, please send one upon receipt of this letter.  Thank you,  Altha Harm, LPN  August 28, 2009 8:50 AM  [facility]

## 2010-07-20 NOTE — Letter (Signed)
Summary: Device-Delinquent Phone Journalist, newspaper, Main Office  [address]   [place], [place] [address]   Phone: [phone]  Fax: [phone]     January 05, 2010 MRN: [id number]   [place] [address] [place], [place]  [address]   Dear Ms. Pixler,  According to our records, you were scheduled for a device phone transmission on  12-31-2009.     We did not receive any results from this check.  If you transmitted on your scheduled day, please call us to help troubleshoot your system.  If you forgot to send your transmission, please send one upon receipt of this letter.  Thank you,   Architectural [facility]ologist [facility]

## 2010-07-20 NOTE — Medication Information (Signed)
Summary: rov/cb  Anticoagulant Therapy  Managed by: Elaina Pattee, PharmD Referring MD: Sharrell Ku PCP: Joycelyn Rua Supervising MD: Myrtis Ser MD, Tinnie Gens Indication 1: Atrial Fibrillation (ICD-427.31) Lab Used: [facility] Site: [facility] INR POC 1.0 INR RANGE 2 - 3  Dietary changes: no    Health status changes: no    Bleeding/hemorrhagic complications: no    Recent/future hospitalizations: yes       Details: Pt will be having spinal injection today. Date was moved up. Has been off Coumadin for 5 days since 12/10/09.  Any changes in medication regimen? no    Recent/future dental: no  Any missed doses?: no       Is patient compliant with meds? yes       Allergies: 1)  ! Aspirin 2)  ! Ibuprofen 3)  ! Sulfa 4)  ! Infed (Iron Dextran) 5)  ! Reglan  Anticoagulation Management History:      The patient is taking warfarin and comes in today for a routine follow up visit.  Positive risk factors for bleeding include an age of 75 years or older and presence of serious comorbidities.  The bleeding index is 'intermediate risk'.  Positive CHADS2 values include History of CHF and History of HTN.  Negative CHADS2 values include Age > 50 years old.  The start date was 08/07/2001.  Her last INR was 5.8 RATIO.  Anticoagulation responsible provider: Myrtis Ser MD, Tinnie Gens.  INR POC: 1.0.  Cuvette Lot#: [id number].  Exp: 01/2011.    Anticoagulation Management Assessment/Plan:      The patient's current anticoagulation dose is Warfarin sodium 3 mg  tabs: use as directed by coumadin clinic.  The target INR is 2.0-3.0.  The next INR is due 12/29/2009.  Anticoagulation instructions were given to patient.  Results were reviewed/authorized by Elaina Pattee, PharmD.  She was notified by Elaina Pattee, PharmD.         Prior Anticoagulation Instructions: INR 2.5. Take 1 tablet daily except 1.5 tablets on Mon, Wed, Fri.   Take your last dose of Coumadin on 12/22/09. Hold Coumadin until after injection  12/28/09. Recheck on 12/28/09 prior to injection.  Current Anticoagulation Instructions: INR 1.0. Spinal injection today, then restart Coumadin as directed by MD after injection. The day you start Coumadin, take 2 tablets for 2 days, then take 1 tablet daily except 1.5 tablets Mon, Wed, Fri. Recheck in 10-14 days.

## 2010-07-20 NOTE — Medication Information (Signed)
Summary: rov/ewj  Anticoagulant Therapy  Managed by: Bethena Midget, RN, BSN Referring MD: Sharrell Ku PCP: Joycelyn Rua Supervising MD: Shirlee Latch MD, Meranda Dechaine Indication 1: Atrial Fibrillation (ICD-427.31) Lab Used: [facility] Site: [facility] INR POC 2.1 INR RANGE 2 - 3  Dietary changes: no    Health status changes: no    Bleeding/hemorrhagic complications: no     Any changes in medication regimen? yes       Details: Last Monday decreased to 10mg s of Prednisone and yesterday decreased to 7.5mg s daily as a maintence dose.   Recent/future dental: no  Any missed doses?: no       Is patient compliant with meds? yes      Comments: Still on Zpak 3 days aweek.   Allergies: 1)  ! Aspirin 2)  ! Ibuprofen 3)  ! Sulfa 4)  ! Infed (Iron Dextran) 5)  ! Reglan  Anticoagulation Management History:      The patient is taking warfarin and comes in today for a routine follow up visit.  Positive risk factors for bleeding include an age of 18 years or older and presence of serious comorbidities.  The bleeding index is 'intermediate risk'.  Positive CHADS2 values include History of CHF and History of HTN.  Negative CHADS2 values include Age > 22 years old.  The start date was 08/07/2001.  Her last INR was 5.8 RATIO.  Anticoagulation responsible provider: Shirlee Latch MD, Alyviah Crandle.  INR POC: 2.1.  Cuvette Lot#: [id number].  Exp: 11/2010.    Anticoagulation Management Assessment/Plan:      The patient's current anticoagulation dose is Warfarin sodium 3 mg  tabs: use as directed by coumadin clinic.  The target INR is 2.0-3.0.  The next INR is due 11/19/2009.  Anticoagulation instructions were given to patient.  Results were reviewed/authorized by Bethena Midget, RN, BSN.  She was notified by Bethena Midget, RN, BSN.         Prior Anticoagulation Instructions: INR 2.7  Continue on same dosage 1 tablet daily except 1.5 tablets on Mondays, Wednesdays, and Fridays.  Recheck in 4 weeks.    Current  Anticoagulation Instructions: INR 2.1 Continue 3mg s daily except 4.5mg s on Mondays, Wednesdays and Fridays. Recheck in 4 weeks.

## 2010-07-20 NOTE — Assessment & Plan Note (Signed)
Summary: cough, VCD, asthma   Visit Type:  Follow-up Copy to:  Ravissankar Avva,MD Primary Provider/Referring Provider:  Ravisankar Avva,MD  CC:  Asthma...VCD...the pt c/o cough x6 days...mucus is clear...audible wheeze.  History of Present Illness: 77 yo woman, former smoker, carries hx of asthma dx in 1989 at [facility]. In retrospect probably had childhood asthma as well. Also with allergies, reported VCD (with speech rx in the past). She has been treated with bronchodilators, ICS, systemic steroids x 27 years (c/b adrenal insuff when she has stopped), on Xolair per Dr Lucie Leather - feels better on the xolair. Not on immunotherapy, has been in distant past. Currently on Spiriva + QVAR. 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 Seen at [facility] April '11 for her cough. CT scan perfomed there to eval bronchiectasis. Then referred to speech therapy [facility] , received botox UA injection with improvement in symptoms.    Has been rx agressively for GERD. She keeps a hoarse voice, a chronic cough. Presents today regarding the cough.   ROV 05/04/10 -- Hx VCD, asthma, allergies. She had Botox injections 6 weeks ago, may have been beneficial but it causes some swallowing difficulties. Still w cough, voice hoarse. Her nasal drainage has worsened since last visit, as has the cough. Started the loratadine last time, is on rhinocort, never started the NSWs, taking pantoprazole. Was never referred to speech therapy. We stopped Spiriva and she doesn't miss it (but cough remained).   Current Medications (verified): 1)  Qvar 40 Mcg/act Aers (Beclomethasone Dipropionate) .... Uad 2)  Rhinocort Aqua 32 Mcg/act  Susp (Budesonide) .... Inhale 1 Spray in Each Nostril Once Daily 3)  Trazodone Hcl 100 Mg Tabs (Trazodone Hcl) .... At Bedtime 4)  Cyclobenzaprine Hcl 10 Mg  Tabs (Cyclobenzaprine Hcl) .... Take 1 Tablet By Mouth Once A Day As Needed 5)  Lunesta 3 Mg  Tabs (Eszopiclone)  .... Take One At Bedtime 6)  Epipen 0.3 Mg/0.25ml (1:1000)  Devi (Epinephrine Hcl (Anaphylaxis)) .... For Use As Needed 7)  Meloxicam 15 Mg  Tabs (Meloxicam) .... Take One Tablet Every Morning. 8)  Synthroid 50 Mcg  Tabs (Levothyroxine Sodium) .... Take One Tablet By Mouth Every Other Day Alternating With Synthroid 9)  Accolate 20 Mg  Tabs (Zafirlukast) .... Take One Tablet Two Times A Day 10)  Warfarin Sodium 3 Mg  Tabs (Warfarin Sodium) .... Use As Directed By Coumadin Clinic 11)  Ranitidine Hcl 150 Mg Tabs (Ranitidine Hcl) .... Take 2 Tablet At Qhs 12)  Simvastatin 10 Mg  Tabs (Simvastatin) .... Take One Tablet At Bedtime 13)  Prednisone 5 Mg Tabs (Prednisone) .... Take 1 and 1/2  Tablet By Mouth Once A Day 14)  Amlodipine Besylate 2.5 Mg  Tabs (Amlodipine Besylate) .... Take One Table At Bedtime 15)  Aricept 10 Mg Tabs (Donepezil Hcl) .... Once Daily 16)  Ventolin Hfa 108 (90 Base) Mcg/act Aers (Albuterol Sulfate) .... 2 Puffs Q4h As Needed Sob 17)  Hydrocodone-Acetaminophen 5-500 Mg  Tabs (Hydrocodone-Acetaminophen) .... For Use As Needed 18)  Tussionex Pennkinetic Er 8-10 Mg/70ml  Lqcr (Chlorpheniramine-Hydrocodone) .... One Teaspoon Every 12 Hours As Needed. 19)  Synthroid 75 Mcg  Tabs (Levothyroxine Sodium) .... Take 1 Tab By Mouth Every Other Day Alternating With 50 Mcg 20)  Xolair 150 Mg  Solr (Omalizumab) .... Injections Every 4 Weeks 21)  Cymbalta 30 Mg  Cpep (Duloxetine Hcl) .... Take 3 Tabs By Mouth Daily 22)  Calcium 600+d 600-400 Mg-Unit Tabs (Calcium Carbonate-Vitamin D) .[name].. 1 By Mouth Two Times A Day 23)  Multivitamins  Tabs (Multiple Vitamin) .... Once Daily 24)  Stool Softener 100 Mg Caps (Docusate Sodium) .... As Directed 25)  Nystatin 100000 Unit/ml Susp (Nystatin) .[name].. 1 Tbsp Nightly 26)  Nu-Iron 150 Mg Caps (Polysaccharide Iron Complex) .... One By Mouth Two Times A Day 27)  Vitamin D3 50000 Unit Caps (Cholecalciferol) .... Once A Week 28)  Atacand 16 Mg Tabs  (Candesartan Cilexetil) .... Take One Tablet By Mouth Once Daily. 29)  Furosemide 40 Mg Tabs (Furosemide) .... Take As Directed 30)  Loratadine 10 Mg Tabs (Loratadine) .... Take One By Mouth Once Daily 31)  Saline Washes .... Do Once A Day 32)  Pantoprazole Sodium 40 Mg Tbec (Pantoprazole Sodium) .[name].. 1 By Mouth Once Daily 30-60 Minutes Before Breakfast 33)  Vitamin C 500 Mg Tabs (Ascorbic Acid) .[name].. 1 By Mouth Daily 34)  Fish Oil 300 Mg Caps (Omega-3 Fatty Acids) .... 900mg  Daily  Allergies (verified): 1)  ! Aspirin 2)  ! Ibuprofen 3)  ! Sulfa 4)  ! Infed (Iron Dextran) 5)  ! Reglan  Vital Signs:  Patient profile:   77 year old female Height:      65 inches (165.10 cm) Weight:      174.13 pounds (79.15 kg) BMI:     29.08 O2 Sat:      100 % on Room air Temp:     98.7 degrees F (37.06 degrees C) oral Pulse rate:   114 / minute BP sitting:   118 / 76  (right arm) Cuff size:   regular  Vitals Entered By: Michel Bickers CMA (May 04, 2010 11:21 AM)  O2 Sat at Rest %:  100 O2 Flow:  Room air CC: Asthma...VCD...the pt c/o cough x6 days...mucus is clear...audible wheeze Comments Medications reviewed with patient Michel Bickers CMA  May 04, 2010 11:33 AM   Physical Exam  General:  normal appearance, healthy appearing, and obese.   Head:  normocephalic and atraumatic Eyes:  conjunctiva and sclera clear Nose:  no deformity, discharge, inflammation, or lesions Mouth:  no deformity or lesions Neck:  mild UA noise on exp, hoarse voice Lungs:  good air movement, no wheeze, no crackles Heart:  regular rate and rhythm, S1, S2 without murmurs, rubs, gallops, or clicks Abdomen:  not examined Msk:  no deformity or scoliosis noted with normal posture Extremities:  no clubbing, cyanosis, edema, or deformity noted Neurologic:  non-focal Psych:  alert and cooperative; normal mood and affect; normal attention span and concentration   Impression & Recommendations:  Problem # 1:   COUGH (ICD-786.2)  Multifactorial, severe UA irritation syndrome - attempt to address underlying contributors - speech rx eval - botox per [facility] - contin pred and xolair - rov 4 mo - Ct scab with mild RML broinchiectasis - follow  Orders: Est. Patient Level IV (84166)  Medications Added to Medication List This Visit: 1)  Prednisone 5 Mg Tabs (Prednisone) .... Take 1 and 1/2  tablet by mouth once a day 2)  Calcium 600+d 600-400 Mg-unit Tabs (Calcium carbonate-vitamin d) .[name].. 1 by mouth two times a day 3)  Multivitamins Tabs (Multiple vitamin) .... Once daily 4)  Stool Softener 100 Mg Caps (Docusate sodium) .... As directed 5)  Vitamin D3 50000 Unit Caps (Cholecalciferol) .... Once a week 6)  Vitamin C 500 Mg Tabs (Ascorbic acid) .[name].. 1 by mouth daily 7)  Fish Oil 300  Mg Caps (Omega-3 fatty acids) .... 900mg  daily  Patient Instructions: 1)  Start using your nasal saline washes daily 2)  Continue rhinocort and loratadine 3)  Continue pantoprazole 4)  We will confirm referral to speech therapy 5)  Follow up with Dr Delton Coombes in 4 months to review your progress   Immunization History:  Influenza Immunization History:    Influenza:  historical (04/08/2010)

## 2010-07-20 NOTE — Cardiovascular Report (Signed)
Summary: Office Visit   Office Visit   Imported By: Roderic Ovens 06/25/2009 13:59:46  _____________________________________________________________________  External Attachment:    Type:   Image     Comment:   External Document

## 2010-07-20 NOTE — Medication Information (Signed)
Summary: rov/ewj  Anticoagulant Therapy  Managed by: Cloyde Reams, RN, BSN Referring MD: Sharrell Ku, MD PCP: Joycelyn Rua Supervising MD: Eden Emms MD, Theron Arista Indication 1: Atrial Fibrillation (ICD-427.31) Lab Used: [facility] Site: [facility] INR POC 4.6 INR RANGE 2 - 3  Dietary changes: no    Health status changes: no    Bleeding/hemorrhagic complications: yes       Details: Grandaughter hit L eye, bruised.    Recent/future hospitalizations: no    Any changes in medication regimen? no    Recent/future dental: no  Any missed doses?: no       Is patient compliant with meds? yes       Allergies: 1)  ! Aspirin 2)  ! Ibuprofen 3)  ! Sulfa 4)  ! Infed (Iron Dextran) 5)  ! Reglan  Anticoagulation Management History:      The patient is taking warfarin and comes in today for a routine follow up visit.  Positive risk factors for bleeding include an age of 75 years or older and presence of serious comorbidities.  The bleeding index is 'intermediate risk'.  Positive CHADS2 values include History of CHF and History of HTN.  Negative CHADS2 values include Age > 66 years old.  The start date was 08/07/2001.  Her last INR was 5.8 RATIO.  Anticoagulation responsible provider: Eden Emms MD, Theron Arista.  INR POC: 4.6.  Cuvette Lot#: [id number].  Exp: 05/2011.    Anticoagulation Management Assessment/Plan:      The patient's current anticoagulation dose is Warfarin sodium 3 mg  tabs: use as directed by coumadin clinic.  The target INR is 2.0-3.0.  The next INR is due 04/29/2010.  Anticoagulation instructions were given to patient.  Results were reviewed/authorized by Cloyde Reams, RN, BSN.  She was notified by Cloyde Reams RN.         Prior Anticoagulation Instructions: INR 4.7  Skip today and tomorrow's dosage of Coumadin, then resume same dosage 1 tablet daily except 1.5 tablets on Mondays, Wednesdays, and Fridays.  Recheck in 10 days.   Current Anticoagulation Instructions: INR  4.6  Skip today and tomorrow's dosage of Coumadin, then start taking 1 tablet daily except 1.5 tablets on Mondays.  Recheck in 10 days-2 weeks.

## 2010-07-20 NOTE — Letter (Signed)
Summary: Remote Device Check  [facility], Main Office  [address]   [place], [place] [address]   Phone: [phone]  Fax: [phone]     January 25, 2010 MRN: [id number]   [name] [address] [place], [place]  [address]   Dear Ms. Butzer,   Your remote transmission was recieved and reviewed by your physician.  All diagnostics were within normal limits for you.  __X___Your next transmission is scheduled for:   04-29-2010.  Please transmit at any time this day.  If you have a wireless device your transmission will be sent automatically.   Sincerely,  Vella Kohler

## 2010-07-20 NOTE — Progress Notes (Signed)
Summary: Speak directly to [facility]  Phone Note Call from Patient Call back at [facility] Phone [phone]   Caller: Patient Call For: Dr. Leone Payor Reason for Call: Talk to Nurse Summary of Call: requesting to speak directly to [facility] Initial call taken by: Karna Christmas,  May 25, 2010 3:34 PM  Follow-up for Phone Call        pt is checking in to let us know she has been in the hospital with pneumonia.  She is taking the ranitidine at night and taking the pantoprazole in the morning before breakfast.  She is having symptoms still.  Pt just wanted to let us know. Pt will be home late afternoon tomorrow if any call back needed. Follow-up by: Francee Piccolo CMA Duncan Dull),  May 25, 2010 4:58 PM  Additional Follow-up for Phone Call Additional follow up Details #1::        Is it heartburn or diarrhea - need clarification of what symptonms she is having Iva Boop MD, [facility]  May 25, 2010 6:30 PM  Sorry, pt is having heartburn symptoms.   Francee Piccolo CMA Duncan Dull)  May 26, 2010 1:27 PM     Additional Follow-up for Phone Call Additional follow up Details #2::    She is on 34 medications and supplement so I am not goig to add or change any meds over the phone. Sorry, but difficult situation at this time. She should give it some time and follow reflux precautions. If vomiting, having dysphagia or bleeding, then call back. she may schedule a non-urgent visit if desired. Iva Boop MD, [facility]  May 26, 2010 2:08 PM  Pt is advised of the above.  I mailed pt Reflux information.  Pt will call to schedule an appt if she begins having any dysphagia,vomiting or bleeding.  She will also call if her symptoms do not improve. Follow-up by: Francee Piccolo CMA Duncan Dull),  May 27, 2010 12:53 PM

## 2010-07-20 NOTE — Medication Information (Signed)
Summary: rov/nb  Anticoagulant Therapy  Managed by: Weston Brass, PharmD Referring MD: Sharrell Ku, MD PCP: Joycelyn Rua Supervising MD: Ladona Ridgel MD, Sharlot Gowda Indication 1: Atrial Fibrillation (ICD-427.31) Lab Used: [facility]  Site: [facility] INR POC 3.3 INR RANGE 2 - 3  Dietary changes: no    Health status changes: no    Bleeding/hemorrhagic complications: no    Recent/future hospitalizations: no    Any changes in medication regimen? yes       Details: still on prednisone 10mg .  Finished Avelox 2 days ago  Recent/future dental: no  Any missed doses?: no       Is patient compliant with meds? yes       Allergies: 1)  ! Aspirin 2)  ! Ibuprofen 3)  ! Sulfa 4)  ! Infed (Iron Dextran) 5)  ! Reglan  Anticoagulation Management History:      The patient is taking warfarin and comes in today for a routine follow up visit.  Positive risk factors for bleeding include an age of 75 years or older and presence of serious comorbidities.  The bleeding index is 'intermediate risk'.  Positive CHADS2 values include History of CHF and History of HTN.  Negative CHADS2 values include Age > 75 years old.  The start date was 08/07/2001.  Her last INR was 5.8 RATIO.  Anticoagulation responsible provider: Ladona Ridgel MD, Sharlot Gowda.  INR POC: 3.3.  Cuvette Lot#: [id number].  Exp: 03/2011.    Anticoagulation Management Assessment/Plan:      The patient's current anticoagulation dose is Warfarin sodium 3 mg  tabs: use as directed by coumadin clinic.  The target INR is 2.0-3.0.  The next INR is due 06/16/2010.  Anticoagulation instructions were given to patient.  Results were reviewed/authorized by Weston Brass, PharmD.  She was notified by Weston Brass PharmD.         Prior Anticoagulation Instructions: INR 3.5 Skip dose for today, take 0.5 tablet tomorrow then take 1 tablet everyday Recheck INR in 1 week  Current Anticoagulation Instructions: INR 3.3  Skip today's dose of Coumadin then resume same dose of  1 tablet every day.  Recheck INR in 2-3 weeks.

## 2010-07-20 NOTE — Medication Information (Signed)
Summary: rovmp  Anticoagulant Therapy  Managed by: Bethena Midget, RN, BSN Referring MD: Sharrell Ku PCP: Joycelyn Rua Supervising MD: Graciela Husbands MD, Viviann Spare Indication 1: Atrial Fibrillation (ICD-427.31) Lab Used: [facility] Site: [facility] INR POC 2.3 INR RANGE 2 - 3  Dietary changes: no    Health status changes: yes       Details: non-productive cough that has lasted now over month  Bleeding/hemorrhagic complications: no    Recent/future hospitalizations: no    Any changes in medication regimen? no    Recent/future dental: no  Any missed doses?: no       Is patient compliant with meds? yes       Allergies: 1)  ! Aspirin 2)  ! Ibuprofen 3)  ! Sulfa 4)  ! Infed (Iron Dextran) 5)  ! Reglan  Anticoagulation Management History:      The patient is taking warfarin and comes in today for a routine follow up visit.  Positive risk factors for bleeding include an age of 75 years or older and presence of serious comorbidities.  The bleeding index is 'intermediate risk'.  Positive CHADS2 values include History of CHF and History of HTN.  Negative CHADS2 values include Age > 66 years old.  The start date was 08/07/2001.  Her last INR was 5.8 RATIO.  Anticoagulation responsible provider: Graciela Husbands MD, Viviann Spare.  INR POC: 2.3.  Cuvette Lot#: [id number].  Exp: 09/2010.    Anticoagulation Management Assessment/Plan:      The patient's current anticoagulation dose is Warfarin sodium 3 mg  tabs: use as directed by coumadin clinic.  The target INR is 2.0-3.0.  The next INR is due 07/29/2009.  Anticoagulation instructions were given to patient.  Results were reviewed/authorized by Bethena Midget, RN, BSN.  She was notified by Bethena Midget, RN, BSN.         Prior Anticoagulation Instructions: INR 2.5  Continue on same dosage 1 tablet daily except 1.5 tablets on Mondays, Wednesdays, and Fridays.   Recheck in 3 weeks.    Current Anticoagulation Instructions: INR 2.3 Continue 3mg s daily except  4.5mg s on Mondays, Wednesdays and Fridays. Recheck in 4 weeks.

## 2010-07-20 NOTE — Assessment & Plan Note (Signed)
Summary: pc2 check/sl   Visit Type:  Follow-up Referring Provider:  Ravissankar Avva,MD Primary Provider:  Ravisankar Avva,MD   History of Present Illness: [name] returns today for followup.  She was seen by me several months ago with severe fatigue and malaise and was subsequently found to have a Hgb of 7.  She was started on Iron therapy.  She denies c/p or peripheral edema. She does have severe fatigue and is undergoing evaluation for a chronic cough. No fevers or chills.   Current Medications (verified): 1)  Symbicort 160-4.5 Mcg/act  Aero (Budesonide-Formoterol Fumarate) .... Use Two Puffs Two Times A Day 2)  Spiriva Handihaler 18 Mcg  Caps (Tiotropium Bromide Monohydrate) .... Use Once Daily 3)  Rhinocort Aqua 32 Mcg/act  Susp (Budesonide) .... Inhale 1 Spray in Each Nostril Once Daily 4)  Trazodone Hcl 50 Mg  Tabs (Trazodone Hcl) .... Take 1 To 2 Tabs By Mouth At Bedtime 5)  Cyclobenzaprine Hcl 10 Mg  Tabs (Cyclobenzaprine Hcl) .... Take 1 Tablet By Mouth Once A Day As Needed 6)  Lunesta 3 Mg  Tabs (Eszopiclone) .... Take One At Bedtime 7)  Epipen 0.3 Mg/0.15ml (1:1000)  Devi (Epinephrine Hcl (Anaphylaxis)) .... For Use As Needed 8)  Meloxicam 15 Mg  Tabs (Meloxicam) .... Take One Tablet Every Morning. 9)  Synthroid 50 Mcg  Tabs (Levothyroxine Sodium) .... Take One Tablet By Mouth Every Other Day Alternating With Synthroid 10)  Accolate 20 Mg  Tabs (Zafirlukast) .... Take One Tablet Two Times A Day 11)  Warfarin Sodium 3 Mg  Tabs (Warfarin Sodium) .... Use As Directed By Coumadin Clinic 12)  Ranitidine Hcl 150 Mg Tabs (Ranitidine Hcl) .... Take 2 Tablet At Qhs 13)  Simvastatin 10 Mg  Tabs (Simvastatin) .... Take One Tablet At Bedtime 14)  Prednisone 5 Mg Tabs (Prednisone) .[name].. 1 1/2 By Mouth Once Daily 15)  Amlodipine Besylate 2.5 Mg  Tabs (Amlodipine Besylate) .... Take One Table At Bedtime 16)  Aricept 5 Mg  Tabs (Donepezil Hcl) .... Tak 2  Tablet Daily. 17)  Xopenex Hfa  45 Mcg/act  Aero (Levalbuterol Tartrate) .... Two Puffs Two Times A Day As Needed 18)  Xopenex 1.25 Mg/58ml  Nebu (Levalbuterol Hcl) .... Use 1 Vial in Nebulizer Every 4 To 6 Hours As Needed 19)  Hydrocodone-Acetaminophen 5-500 Mg  Tabs (Hydrocodone-Acetaminophen) .... For Use As Needed 20)  Tussionex Pennkinetic Er 8-10 Mg/64ml  Lqcr (Chlorpheniramine-Hydrocodone) .... One Teaspoon Every 12 Hours As Needed. 21)  Synthroid 75 Mcg  Tabs (Levothyroxine Sodium) .... Take 1 Tab By Mouth Every Other Day Alternating With 50 Mcg 22)  Xolair 150 Mg  Solr (Omalizumab) .... Injections Every 4 Weeks 23)  Cymbalta 30 Mg  Cpep (Duloxetine Hcl) .... Take 3 Tabs By Mouth Daily 24)  Calcium +vitamin D 600mg  .... Take 1 Tablet By Mouth Two Times A Day 25)  One-A-Day Mvi .... Take 1 Tablet By Mouth Once A Day 26)  Stool Softner .... Take As Directed 27)  Nystatin 100000 Unit/ml Susp (Nystatin) .[name].. 1 Tbsp Nightly 28)  Nu-Iron 150 Mg Caps (Polysaccharide Iron Complex) .... One By Mouth Two Times A Day 29)  Dexilant 30 Mg Cpdr (Dexlansoprazole) .... Take 1 Capsule Daily 30)  Vitamin D .... Weekly 31)  Azithromycin 250 Mg Tabs (Azithromycin) .... Uad 32)  Atacand 16 Mg Tabs (Candesartan Cilexetil) .... Take One Tablet By Mouth Once Daily.  Allergies (verified): 1)  ! Aspirin 2)  ! Ibuprofen 3)  !  Sulfa 4)  ! Infed (Iron Dextran) 5)  ! Reglan  Past History:  Past Medical History: Last updated: 01/05/2009 HEART FAILURE (ICD-428.9) SARCOMA (ICD-199.1) HAMMER TOE (ICD-755.66) HIATAL HERNIA (ICD-553.3) INSOMNIA (ICD-780.52) OSTEOARTHRITIS (ICD-715.90) HYPOTHYROIDISM (ICD-244.9) HYPERTENSION (ICD-401.9) Hx of ATRIAL FIBRILLATION (ICD-427.31) ASTHMA (ICD-493.90) Hx of SCIATICA (ICD-724.3) Hx of HEPATIC CYST (ICD-573.8) SLEEP DISORDER, HX OF (ICD-V15.89) NEOPLASM, MALIGNANT, BREAST (ICD-174.9)  history of vocal cord dysfunction, and has been seen at the voice disorder Center at  [facility] Anemia Arrhythmia colon polyps COPD GERD Pneumonia Urinary Tract Infection  Past Surgical History: Last updated: 01/05/2009 history of multiple sinus surgeries. Appendectomy Hernia Surgery Hysterectomy Pacemaker amputation of finger 6 eye surgeries  Review of Systems  The patient denies chest pain, syncope, and peripheral edema.    Vital Signs:  Patient profile:   77 year old female Height:      65 inches Weight:      174 pounds BMI:     29.06 Pulse rate:   91 / minute BP sitting:   140 / 70  (left arm)  Vitals Entered By: Laurance Flatten CMA (September 29, 2009 2:45 PM)  Physical Exam  General:  Well appearing NAD. Head:  normocephalic and atraumatic Eyes:  PERRLA/EOM intact; conjunctiva and lids normal. Mouth:  Teeth, gums and palate normal. Oral mucosa normal. Neck:  Neck supple, no JVD. No masses, thyromegaly or abnormal cervical nodes. Chest Wall:  Well healed PPM incision. Lungs:  Decreased  breath sounds with no wheezes, rales or rhonchi. Heart:  RRR with normal S1 and S2.  PMI is not enlarged or laterally displaced. Abdomen:  Bowel sounds positive; abdomen soft and non-tender without masses, organomegaly, or hernias noted. No hepatosplenomegaly. Msk:  Back normal, normal gait. Muscle strength and tone normal. Pulses:  pulses normal in all 4 extremities Extremities:  No clubbing or cyanosis. Neurologic:  Alert and oriented x 3.   PPM Specifications Following MD:  Lewayne Bunting, MD     PPM Vendor:  [employer]     PPM Model Number:  [id number]     PPM Serial Number:  [id number] PPM DOI:  07/20/2006      Lead 1    Location: RA     DOI: 07/20/2006     Model #: [id number]     Serial #: [id number]     Status: active Lead 2    Location: RV     DOI: 07/20/2006     Model #: [id number]     Serial #: [id number]     Status: active  Magnet Response Rate:  BOL 85 ERI 65  Indications:  CHB  Explantation Comments:  pacemaker dependent [facility] Coumadin  PPM Follow Up Remote  Check?  No Battery Voltage:  2.76 V     Battery Est. Longevity:  5 years     Pacer Dependent:  No       PPM Device Measurements Atrium  Amplitude: 5.6 mV, Impedance: 426 ohms, Threshold: 0.875 V at 0.4 msec Right Ventricle  Amplitude: 15.68 mV, Impedance: 571 ohms, Threshold: 0.75 V at 0.4 msec  Episodes MS Episodes:  28     Percent Mode Switch:  <0.1%     Coumadin:  Yes Ventricular High Rate:  0     Atrial Pacing:  6.3%     Ventricular Pacing:  99.8%  Parameters Mode:  DDD     Lower Rate Limit:  60     Upper Rate Limit:  130 Paced AV Delay:  190  Sensed AV Delay:  160 Next Remote Date:  12/31/2009     Next Cardiology Appt Due:  09/19/2010 Tech Comments:  No parameter changes.  C/O weight gain and SOB.  Histogram shows AS rates 70-130bpm.  [facility] transmissions every 3 months.  ROV1 year with Dr. Ladona Ridgel.. Checked by industry. Altha Harm, LPN  September 29, 2009 3:01 PM  MD Comments:  Agree with above.  Impression & Recommendations:  Problem # 1:  CARDIAC PACEMAKER IN SITU (ICD-V45.01) Her device is working normally. Will recheck in several months.  Problem # 2:  HYPERTENSION (ICD-401.9) Her blood pressure is well controlled.  Continue current meds. Her updated medication list for this problem includes:    Amlodipine Besylate 2.5 Mg Tabs (Amlodipine besylate) .[name]... Take one table at bedtime    Atacand 16 Mg Tabs (Candesartan cilexetil) .[name]... Take one tablet by mouth once daily.  Problem # 3:  SLEEP DISORDER, HX OF (ICD-V15.89) She is having a very difficult time with this.  She notes that her cough makes it very difficult to sleep.  Patient Instructions: 1)  Your physician recommends that you schedule a follow-up appointment in: 12 months with Dr Ladona Ridgel

## 2010-07-20 NOTE — Medication Information (Signed)
Summary: rov/sp  Anticoagulant Therapy  Managed by: Weston Brass, PharmD Referring MD: Sharrell Ku, MD PCP: Joycelyn Rua Supervising MD: Myrtis Ser MD, Tinnie Gens Indication 1: Atrial Fibrillation (ICD-427.31) Lab Used: [facility] Site: [facility] INR POC 3.5 INR RANGE 2 - 3  Dietary changes: yes       Details: Not eating as much as she was  Health status changes: yes       Details: Developed PNU w/ 103 temp  Bleeding/hemorrhagic complications: yes       Details: Has significant bruise on left arm from IV access while in hospital  Recent/future hospitalizations: yes       Details: Admitted 11/21 x3d 2/2 PNU  Any changes in medication regimen? yes       Details: Prednisone 60 mg daily taper by 10 mg q2d; started on 11/24; Started on 11/24 Avelox x10d   Recent/future dental: no  Any missed doses?: yes     Details: Hospital held dose 2/2 to high INR; pt also held at d/c and restarted on 11/25  Is patient compliant with meds? yes      Comments: Pt had c/o swelling in right arm & lower extermities. Consulted Dr. Ladona Ridgel: per Dr. Ladona Ridgel recommended Lasix 60 mg q24h x3d then 40 mg q24h & KCl 20 meq q24h x3d then 10 meq q24h. Pt was advised she could cut KCl tablets in half if needed as she expressed concerns about swallowing a large tablet; if this persisted as a problem she was instructed to contact MD for further instructions.   Allergies: 1)  ! Aspirin 2)  ! Ibuprofen 3)  ! Sulfa 4)  ! Infed (Iron Dextran) 5)  ! Reglan  Anticoagulation Management History:      The patient is taking warfarin and comes in today for a routine follow up visit.  Positive risk factors for bleeding include an age of 71 years or older and presence of serious comorbidities.  The bleeding index is 'intermediate risk'.  Positive CHADS2 values include History of CHF and History of HTN.  Negative CHADS2 values include Age > 55 years old.  The start date was 08/07/2001.  Her last INR was 5.8 RATIO.  Anticoagulation  responsible provider: Myrtis Ser MD, Tinnie Gens.  INR POC: 3.5.  Cuvette Lot#: [id number].  Exp: 05/2011.    Anticoagulation Management Assessment/Plan:      The patient's current anticoagulation dose is Warfarin sodium 3 mg  tabs: use as directed by coumadin clinic.  The target INR is 2.0-3.0.  The next INR is due 05/26/2010.  Anticoagulation instructions were given to patient.  Results were reviewed/authorized by Weston Brass, PharmD.  She was notified by Hoy Register, PharmD Candidate.         Prior Anticoagulation Instructions: INR 1.1  Take 1 1/2 tablets today and tomorrow then resume same dose of 1 tablet every day except 1 1/2 tablets on Monday.  Recheck INR in 10-14 days.   Current Anticoagulation Instructions: INR 3.5 Skip dose for today, take 0.5 tablet tomorrow then take 1 tablet everyday Recheck INR in 1 week Prescriptions: KLOR-CON 10 10 MEQ CR-TABS (POTASSIUM CHLORIDE) Take 2 tablets for 3 days, then take 1 tablet by mouth daily.  #30 x 2   Entered by:   Reina Fuse PharmD   Authorized by:   Laren Boom, MD, [facility]   Signed by:   Reina Fuse PharmD on 05/18/2010   Method used:   Electronically to        [facility] [phone]* (retail)       [address]       [place], [place]  [address]       Ph: [phone]       Fax: [phone]   RxID:   [id number]

## 2010-07-20 NOTE — Letter (Signed)
Summary: Device-Delinquent Phone Transmission  [facility], Main Office  [address]   [place], [place] [address]   Phone: [phone]  Fax: [phone]     August 28, 2009 MRN: [id number]   [name] [address] [place], [place]  [address]   Dear Ms. Pelster,  According to our records, you were scheduled for a device phone transmission on       08/26/2009                       .     We did not receive any results from this check.  If you transmitted on your scheduled day, please call us to help troubleshoot your system.  If you forgot to send your transmission, please send one upon receipt of this letter.  Thank you,   [facility] 

## 2010-07-20 NOTE — Medication Information (Signed)
Summary: ccr/ gd  Anticoagulant Therapy  Managed by: [name], PharmD Referring MD: Sharrell Ku PCP: Joycelyn Rua Supervising MD: Excell Seltzer MD, [name] Indication 1: Atrial Fibrillation (ICD-427.31) Lab Used: [facility] Site: [facility] INR POC 2.8 INR RANGE 2 - 3  Dietary changes: no    Health status changes: no    Bleeding/hemorrhagic complications: yes       Details: Woke up with eye hemorrhage day before yesterday.  Recent/future hospitalizations: no    Any changes in medication regimen? no    Recent/future dental: no  Any missed doses?: no       Is patient compliant with meds? yes      Comments: Some vision changes in eye today, but is going to f/u with eye doctor.   Allergies: 1)  ! Aspirin 2)  ! Ibuprofen 3)  ! Sulfa 4)  ! Infed (Iron Dextran) 5)  ! Reglan  Anticoagulation Management History:      The patient is taking warfarin and comes in today for a routine follow up visit.  Positive risk factors for bleeding include an age of 75 years or older and presence of serious comorbidities.  The bleeding index is 'intermediate risk'.  Positive CHADS2 values include History of CHF and History of HTN.  Negative CHADS2 values include Age > 10 years old.  The start date was 08/07/2001.  Her last INR was 5.8 RATIO.  Anticoagulation responsible provider: Excell Seltzer MD, [name].  INR POC: 2.8.  Cuvette Lot#: [id number].  Exp: 03/2011.    Anticoagulation Management Assessment/Plan:      The patient's current anticoagulation dose is Warfarin sodium 3 mg  tabs: use as directed by coumadin clinic.  The target INR is 2.0-3.0.  The next INR is due 02/23/2010.  Anticoagulation instructions were given to patient.  Results were reviewed/authorized by [name], PharmD.  She was notified by [name] PharmD.         Prior Anticoagulation Instructions: INR 1.0  Take 2 tablets Wed. Aug 3 and Thu Aug 4.  Then resume with 1.5 tablets on Mon, Wed, Fri and 1 tablet on Tue, Thu, Sat and  Sun.  Return to clinic in 1 week.  Current Anticoagulation Instructions: INR 2.8  Continue taking Coumadin 1 tab (3 mg) on Sun, Tue, Thur, Sat and Coumadin 1.5 tabs (4.5 mg) on Mon, Wed, Fri. Return to clinic in 2 weeks.

## 2010-07-20 NOTE — Medication Information (Signed)
Summary: [name]  Anticoagulant Therapy  Managed by: Bethena Midget, RN, BSN Referring MD: Sharrell Ku PCP: Joycelyn Rua Supervising MD: Clifton James MD, Cristal Deer Indication 1: Atrial Fibrillation (ICD-427.31) Lab Used: [facility] Site: [facility] INR POC 3.1 INR RANGE 2 - 3  Dietary changes: no    Health status changes: yes       Details: Asthma flare up  Bleeding/hemorrhagic complications: no    Recent/future hospitalizations: no    Any changes in medication regimen? yes       Details: Taking Zpk every 3 days for 3 mths regimen completed on 12/25/09  Recent/future dental: no  Any missed doses?: no       Is patient compliant with meds? yes      Comments: Pending B[facility] inj on 01/29/10 at  [facility]  Allergies: 1)  ! Aspirin 2)  ! Ibuprofen 3)  ! Sulfa 4)  ! Infed (Iron Dextran) 5)  ! Reglan  Anticoagulation Management History:      The patient is taking warfarin and comes in today for a routine follow up visit.  Positive risk factors for bleeding include an age of 60 years or older and presence of serious comorbidities.  The bleeding index is 'intermediate risk'.  Positive CHADS2 values include History of CHF and History of HTN.  Negative CHADS2 values include Age > 76 years old.  The start date was 08/07/2001.  Her last INR was 5.8 RATIO.  Anticoagulation responsible provider: Clifton James MD, Cristal Deer.  INR POC: 3.1.  Cuvette Lot#: [id number].  Exp: 02/2011.    Anticoagulation Management Assessment/Plan:      The patient's current anticoagulation dose is Warfarin sodium 3 mg  tabs: use as directed by coumadin clinic.  The target INR is 2.0-3.0.  The next INR is due 01/12/2010.  Anticoagulation instructions were given to patient.  Results were reviewed/authorized by Bethena Midget, RN, BSN.  She was notified by Bethena Midget, RN, BSN.         Prior Anticoagulation Instructions: INR 1.0. Spinal injection today, then restart Coumadin as directed by MD after injection. The  day you start Coumadin, take 2 tablets for 2 days, then take 1 tablet daily except 1.5 tablets Mon, Wed, Fri. Recheck in 10-14 days.  Current Anticoagulation Instructions: INR 3.1 Today 1/2  tablet only then resume 1  tab everyday except 1 1/2 tabs on Mondays, Wednesdays and Fridays. Recheck in 2 weeks.

## 2010-07-20 NOTE — Letter (Signed)
Summary: [facility]   Imported By: Marylou Mccoy 05/17/2010 11:18:47  _____________________________________________________________________  External Attachment:    Type:   Image     Comment:   External Document

## 2010-07-20 NOTE — Assessment & Plan Note (Signed)
Summary: weight gain...as.   History of Present Illness Visit Type: Follow-up Visit Primary GI MD: Stan Head MD [facility] Primary Provider: Joylene Draft Avva,MD Requesting Provider: Joylene Draft Avva,MD Chief Complaint: weight gain. History of Present Illness:   Patient complains of about a 20lb weight gain in the last 6 months. She states that her weight gain has been related to her change in residance (retirement community) and limited mobility. She has alot of gas and bloating with everything she eats. She complains of frequent cramping and abdominal tightness with every meal. She is having alot of diarrhea recently and hot flashes nightly that takes hours to go away.  Dexilant is new medication in the last few months. Her husband lost weight after moving there. Severe trouble with gas also.  Could not tolerate an iron injection, had a reaction Hgb has gotten    GI Review of Systems    Reports abdominal pain, bloating, nausea, and  weight gain.     Location of  Abdominal pain: generalized.    Denies acid reflux, belching, chest pain, dysphagia with liquids, dysphagia with solids, heartburn, loss of appetite, vomiting, vomiting blood, and  weight loss.      Reports change in bowel habits and  diarrhea.     Denies anal fissure, black tarry stools, constipation, diverticulosis, fecal incontinence, heme positive stool, hemorrhoids, irritable bowel syndrome, jaundice, light color stool, liver problems, rectal bleeding, and  rectal pain.    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   2) Sessile polyp in the antrum, removed. Site treated with hemoclip to prevent bleeding.    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 Uad 2)  Rhinocort Aqua 32 Mcg/act  Susp (Budesonide) .... Inhale 1 Spray in Each Nostril Once Daily 3)  Trazodone Hcl 100 Mg Tabs (Trazodone Hcl) .... At Bedtime 4)  Cyclobenzaprine Hcl 10 Mg  Tabs (Cyclobenzaprine Hcl) .... Take 1 Tablet By Mouth Once A Day As Needed 5)  Lunesta 3 Mg  Tabs (Eszopiclone) .... Take One At Bedtime 6)  Epipen 0.3 Mg/0.22ml (1:1000)  Devi (Epinephrine Hcl (Anaphylaxis)) .... For Use As Needed 7)  Meloxicam 15 Mg  Tabs (Meloxicam) .... Take One Tablet Every Morning. 8)  Synthroid 50 Mcg  Tabs (Levothyroxine Sodium) .... Take One Tablet By Mouth Every Other Day Alternating With Synthroid 9)  Accolate 20 Mg  Tabs (Zafirlukast) .... Take One Tablet Two Times A Day 10)  Warfarin Sodium 3 Mg  Tabs (Warfarin Sodium) .... Use As Directed By Coumadin Clinic 11)  Ranitidine Hcl 150 Mg Tabs (Ranitidine Hcl) .... Take 2 Tablet At Qhs 12)  Simvastatin 10 Mg  Tabs (Simvastatin) .... Take One Tablet At Bedtime 13)  Prednisone 5 Mg Tabs (Prednisone) .[name].. 1 1/2 By Mouth Once Daily 14)  Amlodipine Besylate 2.5 Mg  Tabs (Amlodipine Besylate) .... Take One Table At Bedtime 15)  Aricept 10 Mg Tabs (Donepezil Hcl) .... Once Daily 16)  Ventolin  Hfa 108 (90 Base) Mcg/act Aers (Albuterol Sulfate) .... 2 Puffs Q4h As Needed Sob 17)  Hydrocodone-Acetaminophen 5-500 Mg  Tabs (Hydrocodone-Acetaminophen) .... For Use As Needed 18)  Tussionex Pennkinetic Er 8-10 Mg/36ml  Lqcr (Chlorpheniramine-Hydrocodone) .... One Teaspoon Every 12 Hours As Needed. 19)  Synthroid 75 Mcg  Tabs (Levothyroxine Sodium) .... Take 1 Tab By Mouth Every Other Day Alternating With 50  Mcg 20)  Xolair 150 Mg  Solr (Omalizumab) .... Injections Every 4 Weeks 21)  Cymbalta 30 Mg  Cpep (Duloxetine Hcl) .... Take 3 Tabs By Mouth Daily 22)  Calcium +vitamin D 600mg  .... Take 1 Tablet By Mouth Two Times A Day 23)  One-A-Day Mvi .... Take 1 Tablet By Mouth Once A Day 24)  Stool Softner .... Take As Directed 25)  Nystatin 100000 Unit/ml Susp (Nystatin) .[name].. 1 Tbsp Nightly 26)  Nu-Iron 150 Mg Caps (Polysaccharide Iron Complex) .... One By Mouth Two Times A Day 27)  Dexilant 30 Mg Cpdr (Dexlansoprazole) .... Take 1 Capsule Daily 28)  Vitamin D .... Weekly 29)  Atacand 16 Mg Tabs (Candesartan Cilexetil) .... Take One Tablet By Mouth Once Daily. 30)  Furosemide 20 Mg Tabs (Furosemide) .... Take One Tablet By Mouth Daily. As Needed 31)  Loratadine 10 Mg Tabs (Loratadine) .... Take One By Mouth Once Daily 32)  Saline Washes .... Do Once A Day  Allergies (verified): 1)  ! Aspirin 2)  ! Ibuprofen 3)  ! Sulfa 4)  ! Infed (Iron Dextran) 5)  ! Reglan  Past History:  Past Medical History: SARCOMA (ICD-199.1) HAMMER TOE (ICD-755.66) HIATAL HERNIA (ICD-553.3) INSOMNIA (ICD-780.52) OSTEOARTHRITIS (ICD-715.90) HYPOTHYROIDISM (ICD-244.9) HYPERTENSION (ICD-401.9) ATRIAL FIBRILLATION (ICD-427.31) ASTHMA (ICD-493.90) SCIATICA (ICD-724.3) HEPATIC CYST (ICD-573.8) NEOPLASM, MALIGNANT, BREAST (ICD-174.9)  history of vocal cord dysfunction, and has been seen at the voice disorder Center at [facility] Anemia - chronic blood loss from AVM's (upper and colon - ablated 20100 Arrhythmia colon polyps COPD GERD Pneumonia Urinary Tract Infection Heart Failure 30%  Past Surgical History: Reviewed history from 01/05/2009 and no changes required. history of multiple sinus surgeries. Appendectomy Hernia Surgery Hysterectomy Pacemaker amputation of finger 6 eye surgeries  Family History: emphysema: paternal uncle. heart disease: mother cancer: mother (intestine...mucinous tumor in  cecum), father (pancreas) Family History of Colon Cancer: cousin Family History of Colon Polyps: mother Family History of Prostate Cancer: cousions multiple  Social History: Reviewed history from 01/05/2009 and no changes required. Patient states former smoker.  pt is married. pt has children. 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 Lungs:  Decreased  breath sounds with no wheezes, rales or rhonchi. Heart:  RRR with normal S1 and S2.  PMI is not enlarged or laterally displaced. Abdomen:  Bowel sounds positive; abdomen soft and non-tender without masses, organomegaly, or hernias noted. No hepatosplenomegaly. Extremities:  trace edema blateral LE Neurologic:  short-term memory not completely intact   Impression & Recommendations:  Problem #  1:  WEIGHT GAIN (ICD-783.1) Assessment New Cause not clear does not seeem to be particularly fluid overloaded will look for ascites otherwise would not pursue further Orders: Ultrasound Abdomen (UAS)  Problem # 2:  ANEMIA, SECONDARY TO CHRONIC BLOOD LOSS (ICD-280.0) Assessment: Unchanged Unclear what recent hemoglobin or ferritin is she had a reaction to an iron infusion ( iron dextran)but there are other options if were to still need IV iron may need hematology referral to facilitate if Hgb and ferritin still low  Orders: TLB-CBC Platelet - w/Differential ([id number]-[id number]) TLB-Ferritin ([id number])  Problem # 3:  DIARRHEA (ICD-787.91) Assessment: New ? if Dexilant causing this will hold and see denies ever having  heartburn but on PPI for asthma and vocal cord dysfunction so will likely still need a PPI  Patient Instructions: 1)  Stop taking Dexilant (hold it) and see what happens to the diarrhea. 2)  Please go to the basement to have your lab tests drawn today. 3)  CBC and ferritin 4)  We will call with lab and ultrasound results. 5)  Your ultrasound is scheduled at [facility] on 04/07/10.  See seperate instructions. 6)  The medication list was reviewed and reconciled.  All changed / newly prescribed medications were explained.  A complete medication list was provided to the patient / caregiver. Patient: [name] Note: All result statuses are Final unless otherwise noted.  Tests: (1) CBC Platelet w/Diff (CBCD)   White Cell Count     [H]  13.0 K/uL                   4.5-10.5   Red Cell Count            4.11 Mil/uL                 3.87-5.11   Hemoglobin                12.1 g/dL                   13.0-86.5   Hematocrit           [L]  35.7 %                      36.0-46.0   MCV                       86.9 fl                     78.0-100.0   MCHC                      33.8 g/dL                   78.4-69.6   RDW                  [H]  17.2 %                      11.5-14.6   Platelet Count            254.0 K/uL                  150.0-400.0   Neutrophil %         [H]  89.0 %  43.0-77.0     smear reviewed   Lymphocyte %         [L]  6.6 %                       12.0-46.0   Monocyte %                4.2 %                       3.0-12.0   Eosinophils%              0.1 %                       0.0-5.0   Basophils %               0.1 %                       0.0-3.0   Neutrophill Absolute [H]  11.6 K/uL                   1.4-7.7   Lymphocyte Absolute       0.9 K/uL                    0.7-4.0   Monocyte Absolute         0.5 K/uL                    0.1-1.0  Eosinophils, Absolute                             0.0 K/uL                    0.0-0.7   Basophils Absolute        0.0 K/uL                     0.0-0.1  Tests: (2) Ferritin (FER)   Ferritin                  17.0 ng/mL                  10.0-291.0  Note: An exclamation mark (!) indicates a result that was not dispersed into the flowsheet. Document Creation Date: 04/01/2010 4:50 PM  Hgb normal and ferritin low normal looks like by mouth iron ok at this time. She was notified. Complex patient with many problems.   cc: Dr. Elvera Lennox. Felipa Eth.

## 2010-07-20 NOTE — Medication Information (Signed)
Summary: rov/tm  Anticoagulant Therapy  Managed by: Cloyde Reams, RN, BSN Referring MD: Sharrell Ku PCP: Joycelyn Rua Supervising MD: Juanda Chance MD, Acy Orsak Indication 1: Atrial Fibrillation (ICD-427.31) Lab Used: [facility] Site: [facility] INR POC 2.3 INR RANGE 2 - 3  Dietary changes: no    Health status changes: no    Bleeding/hemorrhagic complications: no    Recent/future hospitalizations: no    Any changes in medication regimen? no    Recent/future dental: no  Any missed doses?: no       Is patient compliant with meds? yes       Allergies: 1)  ! Aspirin 2)  ! Ibuprofen 3)  ! Sulfa 4)  ! Infed (Iron Dextran) 5)  ! Reglan  Anticoagulation Management History:      The patient is taking warfarin and comes in today for a routine follow up visit.  Positive risk factors for bleeding include an age of 75 years or older and presence of serious comorbidities.  The bleeding index is 'intermediate risk'.  Positive CHADS2 values include History of CHF and History of HTN.  Negative CHADS2 values include Age > 24 years old.  The start date was 08/07/2001.  Her last INR was 5.8 RATIO.  Anticoagulation responsible provider: Juanda Chance MD, Smitty Cords.  INR POC: 2.3.  Cuvette Lot#: [id number].  Exp: 03/2011.    Anticoagulation Management Assessment/Plan:      The patient's current anticoagulation dose is Warfarin sodium 3 mg  tabs: use as directed by coumadin clinic.  The target INR is 2.0-3.0.  The next INR is due 02/03/2010.  Anticoagulation instructions were given to patient.  Results were reviewed/authorized by Cloyde Reams, RN, BSN.  She was notified by Cloyde Reams RN.         Prior Anticoagulation Instructions: INR 3.1 Today 1/2  tablet only then resume 1  tab everyday except 1 1/2 tabs on Mondays, Wednesdays and Fridays. Recheck in 2 weeks.   Current Anticoagulation Instructions: INR 2.3  Continue on same dosage 1 tablet daily except 1.5 tablets on Mondays, Wednesdays, and  Fridays.  Recheck in 3-4 weeks.

## 2010-07-20 NOTE — Progress Notes (Signed)
Summary: cat scan  Phone Note Call from Patient Call back at [phone]   Caller: Patient Call For: byrum Summary of Call: want to no if cat scan have come from [facility]. she is seeing dr byrum for the first time Initial call taken by: Rickard Patience,  March 31, 2010 10:50 AM  Follow-up for Phone Call        spoke to [facility] and she will check in Dr. Neville Route office for disc and let me know.Carron Curie CMA  March 31, 2010 11:38 AM  I do not see a CD on this patient and I also checked with Iris downstairs. The patient may want to call [facility] to see if this has been sent out or may need to pick the CD up and bring with her to appt.Michel Bickers [facility]  March 31, 2010 12:19 PM  advised pt spouse that we have not received the cd. they will call [facility] to see what has been done.Carron Curie CMA  March 31, 2010 12:29 PM

## 2010-07-20 NOTE — Miscellaneous (Signed)
Summary: Orders Update  Clinical Lists Changes  Orders: Added new Test order of T-Basic Metabolic Panel ([id number]) - Signed Added new Test order of T-BNP  (B Natriuretic Peptide) ([id number]) - Signed 

## 2010-07-20 NOTE — Assessment & Plan Note (Signed)
Summary: asthma, VCD, allergies, GERD   Visit Type:  Initial Consult Copy to:  Ravissankar Avva,MD Primary Provider/Referring Provider:  Ravisankar Avva,MD  CC:  Pulmonary Consult and c/o increase sob cough productive at times cream to dark gold.  History of Present Illness: 77 yo woman, former smoker, carries hx of asthma dx in 1989 at [name]. In retrospect probably had childhood asthma as well. Also with allergies, reported VCD (with speech rx in the past). She has been treated with bronchodilators, ICS, systemic steroids x 27 years (c/b adrenal insuff when she has stopped), on Xolair per Dr Lucie Leather - feels better on the xolair. Not on immunotherapy, has been in distant past. Currently on Spiriva + QVAR. 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 Seen at [facility] April '11 for her cough. CT scan perfomed there to eval bronchiectasis. Then referred to speech therapy [facility] , received botox UA injection with improvement in symptoms.    Has been rx agressively for GERD. She keeps a hoarse voice, a chronic cough. Presents today regarding the cough.   Preventive Screening-Counseling & Management  Alcohol-Tobacco     Smoking Status: quit     Packs/Day: 0.5     Year Started: 1958     Year Quit: 1979     Pack years: 20  Current Medications (verified): 1)  Qvar 40 Mcg/act Aers (Beclomethasone Dipropionate) .... Uad 2)  Spiriva Handihaler 18 Mcg  Caps (Tiotropium Bromide Monohydrate) .... Use Once Daily 3)  Rhinocort Aqua 32 Mcg/act  Susp (Budesonide) .... Inhale 1 Spray in Each Nostril Once Daily 4)  Trazodone Hcl 100 Mg Tabs (Trazodone Hcl) .... At Bedtime 5)  Cyclobenzaprine Hcl 10 Mg  Tabs (Cyclobenzaprine Hcl) .... Take 1 Tablet By Mouth Once A Day As Needed 6)  Lunesta 3 Mg  Tabs (Eszopiclone) .... Take One At Bedtime 7)  Epipen 0.3 Mg/0.90ml (1:1000)  Devi (Epinephrine Hcl (Anaphylaxis)) .... For Use As Needed 8)  Meloxicam 15 Mg  Tabs  (Meloxicam) .... Take One Tablet Every Morning. 9)  Synthroid 50 Mcg  Tabs (Levothyroxine Sodium) .... Take One Tablet By Mouth Every Other Day Alternating With Synthroid 10)  Accolate 20 Mg  Tabs (Zafirlukast) .... Take One Tablet Two Times A Day 11)  Warfarin Sodium 3 Mg  Tabs (Warfarin Sodium) .... Use As Directed By Coumadin Clinic 12)  Ranitidine Hcl 150 Mg Tabs (Ranitidine Hcl) .... Take 2 Tablet At Qhs 13)  Simvastatin 10 Mg  Tabs (Simvastatin) .... Take One Tablet At Bedtime 14)  Prednisone 5 Mg Tabs (Prednisone) .[name].. 1 1/2 By Mouth Once Daily 15)  Amlodipine Besylate 2.5 Mg  Tabs (Amlodipine Besylate) .... Take One Table At Bedtime 16)  Aricept 10 Mg Tabs (Donepezil Hcl) .... Once Daily 17)  Xopenex Hfa 45 Mcg/act  Aero (Levalbuterol Tartrate) .... Two Puffs Two Times A Day As Needed 18)  Xopenex 1.25 Mg/16ml  Nebu (Levalbuterol Hcl) .... Use 1 Vial in Nebulizer Every 4 To 6 Hours As Needed 19)  Hydrocodone-Acetaminophen 5-500 Mg  Tabs (Hydrocodone-Acetaminophen) .... For Use As Needed 20)  Tussionex Pennkinetic Er 8-10 Mg/20ml  Lqcr (Chlorpheniramine-Hydrocodone) .... One Teaspoon Every 12 Hours As Needed. 21)  Synthroid 75 Mcg  Tabs (Levothyroxine Sodium) .... Take 1 Tab By Mouth Every Other Day Alternating With 50 Mcg 22)  Xolair 150 Mg  Solr (Omalizumab) .... Injections Every 4 Weeks 23)  Cymbalta 30 Mg  Cpep (Duloxetine Hcl) .Marland KitchenMarland KitchenMarland Kitchen  Take 3 Tabs By Mouth Daily 24)  Calcium +vitamin D 600mg  .... Take 1 Tablet By Mouth Two Times A Day 25)  One-A-Day Mvi .... Take 1 Tablet By Mouth Once A Day 26)  Stool Softner .... Take As Directed 27)  Nystatin 100000 Unit/ml Susp (Nystatin) .[name].. 1 Tbsp Nightly 28)  Nu-Iron 150 Mg Caps (Polysaccharide Iron Complex) .... One By Mouth Two Times A Day 29)  Dexilant 30 Mg Cpdr (Dexlansoprazole) .... Take 1 Capsule Daily 30)  Vitamin D .... Weekly 31)  Atacand 16 Mg Tabs (Candesartan Cilexetil) .... Take One Tablet By Mouth Once Daily. 32)  Furosemide  20 Mg Tabs (Furosemide) .... Take One Tablet By Mouth Daily. As Needed  Allergies (verified): 1)  ! Aspirin 2)  ! Ibuprofen 3)  ! Sulfa 4)  ! Infed (Iron Dextran) 5)  ! Reglan  Past History:  Family History: Last updated: 01/05/2009 emphysema: paternal uncle. 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 emphysema: paternal uncle. heart disease: mother cancer: mother (intestine...mucinous tumor in cecum), father (pancreas) Family History of Colon Cancer: cousin Family History of Colon Polyps:  Social History: Reviewed history from 01/05/2009 and no changes required. Patient states former smoker.  pt is married. pt has children. 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    Vital  Signs:  Patient profile:   77 year old female Height:      65 inches Weight:      172.6 pounds BMI:     28.83 O2 Sat:      98 % on Room air Temp:     99.6 degrees F oral Pulse rate:   75 / minute BP sitting:   134 / 72  (left arm)  Vitals Entered By: Renold Genta RCP, LPN (April 01, 2010 9:58 AM)  O2 Sat at Rest %:  98% O2 Flow:  Room air CC: Pulmonary Consult, c/o increase sob cough productive at times cream to dark gold Is Patient Diabetic? No Comments Medications reviewed with patient Renold Genta RCP, LPN  April 01, 2010 9:58 AM n   Physical Exam  General:  normal appearance, healthy appearing, and obese.   Head:  normocephalic and atraumatic Eyes:  conjunctiva and sclera clear Nose:  no deformity, discharge, inflammation, or lesions Mouth:  no deformity or lesions Neck:  mild UA noise on exp, hoarse voice Lungs:  good air movement, no wheeze, no crackles Heart:  regular rate and rhythm, S1, S2 without murmurs, rubs, gallops, or clicks Abdomen:  not  examined Msk:  no deformity or scoliosis noted with normal posture Extremities:  no clubbing, cyanosis, edema, or deformity noted Neurologic:  non-focal Psych:  alert and cooperative; normal mood and affect; normal attention span and concentration   Impression & Recommendations:  Problem # 1:  COUGH (ICD-786.2) Longstanding and with multiple contributors. Review of all the data suggests that this is primarily UA in nature. By report no bronchiectasis on CT scan but I need to confirm this. She has documented VCD, asthma (on xolair), GERD, allergies. Her AFL is actually mild by PFT done in 2009. Would consider the posibility that BD's are making her UA irritation worse - Dr Lucie Leather changed Symbicort to Spiriva + QVAR with this in mind. I would consider stopping the Spiriva as the powder may be a problem. Will try to aggressively treat all the contributors.   Instructions:  Please continue your Xolair as ordered by Dr  Lucie Leather Continue your prednisone daily Continue your QVAR two times a day  STOP Spiriva. Call our office if you worsen or have trouble off the medication Start loratadine 10mg  by mouth once daily  Start doing nasal saline washes EVERY DAY Continue your Rhinocort as you are taking it Continue your GERD medications as directed by Dr Leone Payor We will refer you to speech therapy to help with your abnormal vocal cord movement and symptoms Dr Delton Coombes will review your Ct scan of the chest from [facility] Follow up with Dr Delton Coombes in 1 month to review your status.   Problem # 2:  OTHER DISEASES OF VOCAL CORDS (ICD-478.5)  Orders: Consultation Level IV ([address]) Speech Therapy (Speech Therapy)  Problem # 3:  ASTHMA (ICD-493.90)  Problem # 4:  GERD (ICD-530.81)  Her updated medication list for this problem includes:    Ranitidine Hcl 150 Mg Tabs (Ranitidine hcl) .[name]... Take 2 tablet at qhs    Dexilant 30 Mg Cpdr (Dexlansoprazole) .[name]... Take 1 capsule daily  Problem # 5:  ALLERGIC RHINITIS (ICD-477.9)  Her updated medication list for this problem includes:    Rhinocort Aqua 32 Mcg/act Susp (Budesonide) ..... Inhale 1 spray in each nostril once daily  Medications Added to Medication List This Visit: 1)  Ventolin Hfa 108 (90 Base) Mcg/act Aers (Albuterol sulfate) .... 2 puffs q4h as needed sob  Patient Instructions: 1)  Please continue your Xolair as ordered by Dr Lucie Leather 2)  Continue your prednisone daily 3)  Continue your QVAR two times a day  4)  STOP Spiriva. Call our office if you worsen or have trouble off the medication 5)  Start loratadine 10mg  by mouth once daily  6)  Start doing nasal saline washes EVERY DAY 7)  Continue your Rhinocort as you are taking it 8)  Continue your GERD medications as directed by Dr Leone Payor 9)  We will refer you to speech therapy to help with your abnormal vocal cord movement and symptoms 10)  Dr Delton Coombes will review your Ct scan of the chest from [facility] 11)  Follow up with Dr Delton Coombes in 1 month to review your status.  Prescriptions: VENTOLIN HFA 108 (90 BASE) MCG/ACT AERS (ALBUTEROL SULFATE) 2 puffs q4h as needed SOB  #1 x 11   Entered and Authorized by:   Leslye Peer MD   Signed by:   Leslye Peer MD on 04/01/2010   Method used:   [facility]        [phone][facility] (retail)  [address]       [place], [place]  [address]       Ph: [phone]       Fax: [phone]   RxID:   [id number]

## 2010-07-20 NOTE — Letter (Signed)
Summary: [facility]   Imported By: Marylou Mccoy 03/22/2010 15:15:23  _____________________________________________________________________  External Attachment:    Type:   Image     Comment:   External Document

## 2010-07-20 NOTE — Progress Notes (Signed)
Summary: rx request for Loratadine  Phone Note Call from Patient Call back at Home Phone [phone]   Caller: Patient Call For: byrum Summary of Call: pt needs rx for 90 days supply loratadine- [facility] [place]. (pt says she thought this would've already been done by done) Initial call taken by: [name], CNA,  April 08, 2010 11:35 AM  Follow-up for Phone Call        pt was recently seen by RB on 04/01/2010 and started on Loratadine 10mg  daily. Called and spoke with pt's husband.  Husband requests 90 day rx be sent to local pharmacy. informed pt's husband rx sent to pharmacy. Aundra Millet Reynolds LPN  April 08, 2010 12:05 PM     Prescriptions: LORATADINE 10 MG TABS (LORATADINE) take one by mouth once daily  #90 x 0   Entered by:   Arman Filter LPN   Authorized by:   Leslye Peer MD   Signed by:   Arman Filter LPN on 09/81/1914   Method used:   Telephoned to ...       [facility] [phone]* (retail)       [address]       [place], [place]  [address]       Ph: [phone]       Fax: [phone]   RxID:   [id number]

## 2010-07-20 NOTE — Progress Notes (Signed)
Summary: pt sick  Phone Note Call from Patient Call back at Home Phone [phone]   Caller: Patient Call For: byrum Reason for Call: Talk to Nurse Summary of Call: Patient saw RB 05/04/10.  Husband calling saying her cough has gotten worse, a long with sob.  Asking for appt. today. Initial call taken by: Lehman Prom,  May 10, 2010 11:29 AM  Follow-up for Phone Call        PT husband reports pt has continuos cough (non prod).  SOB all the time.  Has had chest pain for past 2 nights that was not relieved by Nitroglycerin.  Has had some vomiting and diarrhea.  C/O extreme weakness.  OV today with Tammy Parrett at 3:00. Abigail Miyamoto RN  May 10, 2010 11:56 AM

## 2010-07-20 NOTE — Cardiovascular Report (Signed)
Summary: Office Visit Remote   Office Visit Remote   Imported By: Roderic Ovens 09/17/2009 11:40:20  _____________________________________________________________________  External Attachment:    Type:   Image     Comment:   External Document

## 2010-07-20 NOTE — Medication Information (Signed)
Summary: rov/tm  Anticoagulant Therapy  Managed by: Eda Keys, PharmD Referring MD: Sharrell Ku PCP: Joycelyn Rua Supervising MD: Daleen Squibb MD, Maisie Fus Indication 1: Atrial Fibrillation (ICD-427.31) Lab Used: [facility] Site: [facility] INR POC 2.1 INR RANGE 2 - 3  Dietary changes: no    Health status changes: no    Bleeding/hemorrhagic complications: no    Recent/future hospitalizations: no    Any changes in medication regimen? no    Recent/future dental: no  Any missed doses?: no       Is patient compliant with meds? yes       Allergies: 1)  ! Aspirin 2)  ! Ibuprofen 3)  ! Sulfa 4)  ! Infed (Iron Dextran) 5)  ! Reglan  Anticoagulation Management History:      The patient is taking warfarin and comes in today for a routine follow up visit.  Positive risk factors for bleeding include an age of 36 years or older and presence of serious comorbidities.  The bleeding index is 'intermediate risk'.  Positive CHADS2 values include History of CHF and History of HTN.  Negative CHADS2 values include Age > 75 years old.  The start date was 08/07/2001.  Her last INR was 5.8 RATIO.  Anticoagulation responsible provider: Daleen Squibb MD, Maisie Fus.  INR POC: 2.1.  Cuvette Lot#: [id number].  Exp: 10/2010.    Anticoagulation Management Assessment/Plan:      The patient's current anticoagulation dose is Warfarin sodium 3 mg  tabs: use as directed by coumadin clinic.  The target INR is 2.0-3.0.  The next INR is due 09/24/2009.  Anticoagulation instructions were given to patient.  Results were reviewed/authorized by Eda Keys, PharmD.  She was notified by Eda Keys.         Prior Anticoagulation Instructions: INR 2.2 Continue 3mg s everyday except 4.5mg s on Mondays, Wednesdays and Fridays. Recheck in 4 weeks.   Current Anticoagulation Instructions: INR 2.1  Continue current dosing schedule of 1.5 tablets on Monday, Wednesday, and Friday and 1 tablet all other days.  Return to  clinic in 4 weeks.

## 2010-07-20 NOTE — Progress Notes (Signed)
Summary: FYI  Phone Note Call from Patient Call back at Home Phone [phone]   Caller: Patient Call For: byrum Summary of Call: FYI: Pt states she became ill on 11/15 and by 11/18 she was sick with a cough, 11/21 she went by ambulance to ER and was admitted with a diagnosis of bacterial pneumonia, she stayed 3 day and she's still recovering, says she wasn't sure if he was aware of this. Initial call taken by: Darletta Moll,  May 25, 2010 3:51 PM

## 2010-07-20 NOTE — Assessment & Plan Note (Signed)
Summary: Cardiology Nuclear Testing  Nuclear Med Background Indications for Stress Test: Evaluation for Ischemia  Indications Comments: .  History: Asthma, COPD, Echo, GXT, Heart Catheterization, Pacemaker  History Comments: 9/11 Echo:EF=30-35%; h/o afib/flutter  Symptoms: Chest Pain, DOE, Fatigue, Palpitations, Rapid HR, SOB  Symptoms Comments: Last episode of CP:3 weeks ago.   Nuclear Pre-Procedure Cardiac Risk Factors: Family History - CAD, History of Smoking, Hypertension, Lipids Caffeine/Decaff Intake: none NPO After: 10:00 PM Lungs: Diminished, no wheezes.  O2 Sat 95% on RA.  Patient uses her Proventil inhaler @ 7:30 a.m. IV 0.9% NS with Angio Cath: 22g     IV Site: R Wrist IV Started by: Cathlyn Parsons, RN Chest Size (in) 42     Cup Size DD     Height (in): 65 Weight (lb): 176 BMI: 29.39  Nuclear Med Study 1 or 2 day study:  1 day     Stress Test Type:  Adenosine Reading MD:  Willa Rough, MD     Referring MD:  Sharrell Ku, MD Resting Radionuclide:  Technetium 3m Tetrofosmin     Resting Radionuclide Dose:  10.8 mCi  Stress Radionuclide:  Technetium 106m Tetrofosmin     Stress Radionuclide Dose:  33 mCi   Stress Protocol  Dose of Adenosine:  44.8 mg    Stress Test Technologist:  Rea [employer], CMA-N     Nuclear Technologist:  Domenic Polite, CNMT  Rest Procedure  Myocardial perfusion imaging was performed at rest 45 minutes following the intravenous administration of Technetium 71m Tetrofosmin.  Stress Procedure  The patient received IV adenosine at 140 mcg/kg/min for 4 minutes. There were no significant changes with infusion, other than occasional PVC's. Technetium 28m Tetrofosmin was injected at the 2 minute mark and quantitative spect images were obtained after a 45 minute delay.  QPS Raw Data Images:  Normal; no motion artifact; normal heart/lung ratio. Stress Images:  Normal homogeneous uptake in all areas of the myocardium. Rest Images:  Normal  homogeneous uptake in all areas of the myocardium. Subtraction (SDS):  No evidence of ischemia. Transient Ischemic Dilatation:  1.0  (Normal <1.22)  Lung/Heart Ratio:  .28  (Normal <0.45)  Quantitative Gated Spect Images QGS cine images:  non-gated study  Findings Normal nuclear study      Overall Impression  Exercise Capacity: Adenosine study with no exercise. BP Response: Normal blood pressure response. Clinical Symptoms: arms heavy ECG Impression: LBBB Overall Impression Comments: There is no scar or ischemia. Wall motion is not assessed as the study is not gated.

## 2010-07-20 NOTE — Cardiovascular Report (Signed)
Summary: Office Visit   Office Visit   Imported By: Roderic Ovens 10/02/2009 16:06:23  _____________________________________________________________________  External Attachment:    Type:   Image     Comment:   External Document

## 2010-07-20 NOTE — Letter (Signed)
Summary: Remote Device Check  [facility], Main Office  [address]   [place], [place] [address]   Phone: [phone]  Fax: [phone]     September 15, 2009 MRN: [id number]   [facility] [address] [place], [place]  [address]   Dear Ms. Journey,   Your remote transmission was recieved and reviewed by your physician.  All diagnostics were within normal limits for you.    ___X___Your next office visit is scheduled for:  September 29, 2009 AT 2:40PM WITH DR Ladona Ridgel. Please call our office to schedule an appointment.    Sincerely,  Proofreader

## 2010-07-22 NOTE — Miscellaneous (Signed)
Summary: Pantoprazole Refill  Clinical Lists Changes  Medications: Rx of PANTOPRAZOLE SODIUM 40 MG TBEC (PANTOPRAZOLE SODIUM) 1 by mouth once daily 30-60 minutes before breakfast;  #90 x 4;  Signed;  Entered by: Ok Anis CMA;  Authorized by: Iva Boop MD, Clementeen Graham;  Method used: Electronically to [facility]*, [address] [place], [place], [place]  [address], Ph: [phone], Fax: [phone]    Prescriptions: PANTOPRAZOLE SODIUM 40 MG TBEC (PANTOPRAZOLE SODIUM) 1 by mouth once daily 30-60 minutes before breakfast  #90 x 4   Entered by:   Ok Anis CMA   Authorized by:   Iva Boop MD, [facility]   Signed by:   Ok Anis CMA on 06/30/2010   Method used:   Electronically to        [facility]* (mail-order)       [address] [place], [place]  [address]       Ph: [phone]       Fax: [phone]   RxID:   [id number]

## 2010-07-22 NOTE — Letter (Signed)
Summary: Device-Delinquent Phone Journalist, newspaper, Main Office  [address]   [place], [place] [address]   Phone: [phone]  Fax: [phone]     June 15, 2010 MRN: [id number]   [facility] Hod[facility] [address] [place], [place]  [address]   Dear Ms. Tuckerman,  According to our records, you were scheduled for a device phone transmission on 06-10-2010.    We did not receive any results from this check.  If you transmitted on your scheduled day, please call us to help troubleshoot your system.  If you forgot to send your transmission, please send one upon receipt of this letter.  Thank you,   Architectural technologist [facility]

## 2010-07-22 NOTE — Medication Information (Signed)
Summary: rov/mwb  Anticoagulant Therapy  Managed by: Bethena Midget, RN, BSN Referring MD: Sharrell Ku, MD PCP: Joycelyn Rua Supervising MD: Ladona Ridgel MD, Sharlot Gowda Indication 1: Atrial Fibrillation (ICD-427.31) Lab Used: [facility] Site: [facility] INR POC 2.6 INR RANGE 2 - 3  Dietary changes: no    Health status changes: no    Bleeding/hemorrhagic complications: no    Recent/future hospitalizations: no    Any changes in medication regimen? yes       Details: Started taking prednisone.  Taking Avelox daily until 01/16  Recent/future dental: no  Any missed doses?: no       Is patient compliant with meds? yes      Comments: Stopping Coumadin on Saturday pending cortisone injection on the 19th.  Dr. Ladona Ridgel gave ok for stopping Coumadin.  Allergies: 1)  ! Aspirin 2)  ! Ibuprofen 3)  ! Sulfa 4)  ! Infed (Iron Dextran) 5)  ! Reglan  Anticoagulation Management History:      Positive risk factors for bleeding include an age of 11 years or older and presence of serious comorbidities.  The bleeding index is 'intermediate risk'.  Positive CHADS2 values include History of CHF and History of HTN.  Negative CHADS2 values include Age > 53 years old.  The start date was 08/07/2001.  Her last INR was 5.8 RATIO.  Anticoagulation responsible provider: Ladona Ridgel MD, Sharlot Gowda.  INR POC: 2.6.  Exp: 07/2011.    Anticoagulation Management Assessment/Plan:      The patient's current anticoagulation dose is Warfarin sodium 3 mg  tabs: use as directed by coumadin clinic.  The target INR is 2.0-3.0.  The next INR is due 07/08/2010.  Anticoagulation instructions were given to patient.  Results were reviewed/authorized by Bethena Midget, RN, BSN.         Prior Anticoagulation Instructions: INR 3.1 Take 1/2 tablet (1.5mg ) today instead of 1 tablet Then continue taking 1 tablet everyday Recheck INR in 2 weeks  Current Anticoagulation Instructions: INR 2.6 (INR: 2-3)  Last dose of Coumadin on 01/14 for  injection.  Resume post-procedure per MD's instructions with 4.5 mg for 1 day then 3 mg everyday.  Recheck in 1 week.

## 2010-07-22 NOTE — Medication Information (Signed)
Summary: rov  Anticoagulant Therapy  Managed by: Weston Brass, PharmD Referring MD: Sharrell Ku, MD PCP: Joycelyn Rua Supervising MD: Myrtis Ser MD, Tinnie Gens Indication 1: Atrial Fibrillation (ICD-427.31) Lab Used: [facility] Site: [facility] INR POC 1.7 INR RANGE 2 - 3  Dietary changes: no    Health status changes: yes       Details: having some pain and SOB due to asthma  Bleeding/hemorrhagic complications: no    Recent/future hospitalizations: no    Any changes in medication regimen? no    Recent/future dental: no  Any missed doses?: yes     Details: Restarted Coumadin 1 week ago after injection   Is patient compliant with meds? yes       Allergies: 1)  ! Aspirin 2)  ! Ibuprofen 3)  ! Sulfa 4)  ! Infed (Iron Dextran) 5)  ! Reglan  Anticoagulation Management History:      The patient is taking warfarin and comes in today for a routine follow up visit.  Positive risk factors for bleeding include an age of 75 years or older and presence of serious comorbidities.  The bleeding index is 'intermediate risk'.  Positive CHADS2 values include History of CHF and History of HTN.  Negative CHADS2 values include Age > 11 years old.  The start date was 08/07/2001.  Her last INR was 5.8 RATIO.  Anticoagulation responsible provider: Myrtis Ser MD, Tinnie Gens.  INR POC: 1.7.  Exp: 06/2011.    Anticoagulation Management Assessment/Plan:      The patient's current anticoagulation dose is Warfarin sodium 3 mg  tabs: use as directed by coumadin clinic.  The target INR is 2.0-3.0.  The next INR is due 08/05/2010.  Anticoagulation instructions were given to patient.  Results were reviewed/authorized by Weston Brass, PharmD.  She was notified by Weston Brass PharmD.         Prior Anticoagulation Instructions: INR 1.1  Coumadin 3 mg tablets - Take 1.5 tablets today, then resume 1 tablet daily.   Current Anticoagulation Instructions: INR 1.7  Take 1 1/2 tablets today then resume same dose of 1 tablet  every day.  Recheck INR in 3 weeks.

## 2010-07-22 NOTE — Medication Information (Signed)
Summary: rov  Anticoagulant Therapy  Managed by: Weston Brass, PharmD Referring MD: Sharrell Ku, MD PCP: Joycelyn Rua Supervising MD: Tenny Craw MD, Gunnar Fusi Indication 1: Atrial Fibrillation (ICD-427.31) Lab Used: [facility] Site: [facility] INR POC 1.1 INR RANGE 2 - 3  Dietary changes: no    Health status changes: yes       Details: Numbness in finger   Bleeding/hemorrhagic complications: no    Recent/future hospitalizations: yes       Details: Epidural injection this afternoon   Any changes in medication regimen? no    Recent/future dental: no  Any missed doses?: yes     Details: Discontinued last Saturday 1/14   Is patient compliant with meds? yes       Allergies: 1)  ! Aspirin 2)  ! Ibuprofen 3)  ! Sulfa 4)  ! Infed (Iron Dextran) 5)  ! Reglan  Anticoagulation Management History:      The patient is taking warfarin and comes in today for a routine follow up visit.  Positive risk factors for bleeding include an age of 75 years or older and presence of serious comorbidities.  The bleeding index is 'intermediate risk'.  Positive CHADS2 values include History of CHF and History of HTN.  Negative CHADS2 values include Age > 14 years old.  The start date was 08/07/2001.  Her last INR was 5.8 RATIO.  Anticoagulation responsible provider: Tenny Craw MD, Gunnar Fusi.  INR POC: 1.1.  Cuvette Lot#: [id number].  Exp: 06/2011.    Anticoagulation Management Assessment/Plan:      The patient's current anticoagulation dose is Warfarin sodium 3 mg  tabs: use as directed by coumadin clinic.  The target INR is 2.0-3.0.  The next INR is due 07/15/2010.  Anticoagulation instructions were given to patient.  Results were reviewed/authorized by Weston Brass, PharmD.  She was notified by Stephannie Peters, PharmD Candidate 20.         Prior Anticoagulation Instructions: INR 2.6 (INR: 2-3)  Last dose of Coumadin on 01/14 for injection.  Resume post-procedure per MD's instructions with 4.5 mg for 1 day then 3 mg  everyday.  Recheck in 1 week.    Current Anticoagulation Instructions: INR 1.1  Coumadin 3 mg tablets - Take 1.5 tablets today, then resume 1 tablet daily.

## 2010-07-22 NOTE — Medication Information (Signed)
Summary: rov/sp  Anticoagulant Therapy  Managed by: Bayard Hugger, PharmD Referring MD: Sharrell Ku, MD PCP: Joycelyn Rua Supervising MD: Riley Kill MD, Maisie Fus Indication 1: Atrial Fibrillation (ICD-427.31) Lab Used: [facility] Site: [facility] INR RANGE 2 - 3  Dietary changes: no    Health status changes: no    Bleeding/hemorrhagic complications: no    Recent/future hospitalizations: no    Any changes in medication regimen? no    Recent/future dental: no  Any missed doses?: no       Is patient compliant with meds? yes       Allergies: 1)  ! Aspirin 2)  ! Ibuprofen 3)  ! Sulfa 4)  ! Infed (Iron Dextran) 5)  ! Reglan  Anticoagulation Management History:      Positive risk factors for bleeding include an age of 75 years or older and presence of serious comorbidities.  The bleeding index is 'intermediate risk'.  Positive CHADS2 values include History of CHF and History of HTN.  Negative CHADS2 values include Age > 6 years old.  The start date was 08/07/2001.  Her last INR was 5.8 RATIO.  Anticoagulation responsible provider: Riley Kill MD, Maisie Fus.  Cuvette Lot#: [id number].  Exp: 07/2011.    Anticoagulation Management Assessment/Plan:      The patient's current anticoagulation dose is Warfarin sodium 3 mg  tabs: use as directed by coumadin clinic.  The target INR is 2.0-3.0.  The next INR is due 07/01/2010.  Anticoagulation instructions were given to patient.  Results were reviewed/authorized by Bayard Hugger, PharmD.         Prior Anticoagulation Instructions: INR 3.3  Skip today's dose of Coumadin then resume same dose of 1 tablet every day.  Recheck INR in 2-3 weeks.   Current Anticoagulation Instructions: INR 3.1 Take 1/2 tablet (1.5mg ) today instead of 1 tablet Then continue taking 1 tablet everyday Recheck INR in 2 weeks

## 2010-07-22 NOTE — Progress Notes (Signed)
Summary: Pantoprazole RX  ---- Converted from flag ---- ---- 07/05/2010 4:15 PM, Karna Christmas wrote: Wants her Pantoprazole sent to [facility]  # [phone] ------------------------------  Phone Note Call from Patient   Caller: Patient  Follow-up for Phone Call        Called [facility] and cancelled RX and sent RX to [facility] at patient request  Follow-up by: Ok Anis CMA,  July 05, 2010 4:52 PM    Prescriptions: PANTOPRAZOLE SODIUM 40 MG TBEC (PANTOPRAZOLE SODIUM) 1 by mouth once daily 30-60 minutes before breakfast  #90 x 4   Entered by:   Ok Anis CMA   Authorized by:   Iva Boop MD, [facility]   Signed by:   Ok Anis CMA on 07/05/2010   Method used:   Electronically to        [facility] [phone]* (retail)       [address]       [place], [place]  [address]       Ph: [phone]       Fax: [phone]   RxID:   [id number]

## 2010-07-24 ENCOUNTER — Encounter: Payer: Self-pay | Admitting: Internal Medicine

## 2010-07-28 ENCOUNTER — Ambulatory Visit (INDEPENDENT_AMBULATORY_CARE_PROVIDER_SITE_OTHER): Payer: Medicare Other | Admitting: Physician Assistant

## 2010-07-28 ENCOUNTER — Other Ambulatory Visit: Payer: Self-pay | Admitting: Physician Assistant

## 2010-07-28 ENCOUNTER — Encounter: Payer: Self-pay | Admitting: Internal Medicine

## 2010-07-28 ENCOUNTER — Encounter: Payer: Self-pay | Admitting: Physician Assistant

## 2010-07-28 DIAGNOSIS — I498 Other specified cardiac arrhythmias: Secondary | ICD-10-CM

## 2010-07-28 DIAGNOSIS — R5381 Other malaise: Secondary | ICD-10-CM

## 2010-07-28 DIAGNOSIS — R5383 Other fatigue: Secondary | ICD-10-CM

## 2010-07-28 LAB — CBC WITH DIFFERENTIAL/PLATELET
Basophils Absolute: 0 10*3/uL (ref 0.0–0.1)
Eosinophils Absolute: 0 10*3/uL (ref 0.0–0.7)
Eosinophils Relative: 0.4 % (ref 0.0–5.0)
Lymphs Abs: 1.1 10*3/uL (ref 0.7–4.0)
MCHC: 33.8 g/dL (ref 30.0–36.0)
MCV: 85.8 fl (ref 78.0–100.0)
Monocytes Absolute: 0.8 10*3/uL (ref 0.1–1.0)
Neutrophils Relative %: 83.7 % — ABNORMAL HIGH (ref 43.0–77.0)
Platelets: 313 10*3/uL (ref 150.0–400.0)
RDW: 16.7 % — ABNORMAL HIGH (ref 11.5–14.6)
WBC: 12.1 10*3/uL — ABNORMAL HIGH (ref 4.5–10.5)

## 2010-07-28 LAB — BASIC METABOLIC PANEL
BUN: 34 mg/dL — ABNORMAL HIGH (ref 6–23)
Chloride: 97 mEq/L (ref 96–112)
Creatinine, Ser: 1.4 mg/dL — ABNORMAL HIGH (ref 0.4–1.2)
Glucose, Bld: 87 mg/dL (ref 70–99)

## 2010-07-28 NOTE — Miscellaneous (Signed)
Summary: CORRECTED DEVICE INFORMATION  Clinical Lists Changes  Observations: Added new observation of PPMLEADSER2: [id number] (07/24/2010 10:39) Added new observation of PPMLEADDOI2: 09/17/1999 (07/24/2010 10:39) Added new observation of PPMLEADDOI1: 09/17/1999 (07/24/2010 10:39)      PPM Specifications Following MD:  Lewayne Bunting, MD     PPM Vendor:  Medtronic     PPM Model Number:  [id number]     PPM Serial Number:  [id number] PPM DOI:  07/20/2006      Lead 1    Location: RA     DOI: 09/17/1999     Model #: [id number]     Serial #: [id number]     Status: active Lead 2    Location: RV     DOI: 09/17/1999     Model #: 2952     Serial #: [id number]     Status: active  Magnet Response Rate:  BOL 85 ERI 65  Indications:  CHB  Explantation Comments:  pacemaker dependent Carelink Coumadin  PPM Follow Up Pacer Dependent:  Yes      Episodes Coumadin:  Yes  Parameters Mode:  DDD     Lower Rate Limit:  60     Upper Rate Limit:  130 Paced AV Delay:  190     Sensed AV Delay:  160

## 2010-07-28 NOTE — Letter (Signed)
Summary: [facility] - Request to discontinue [facility] - Request to discontinue Meds   Imported By: Marylou Mccoy 07/21/2010 11:29:18  _____________________________________________________________________  External Attachment:    Type:   Image     Comment:   External Document

## 2010-08-02 ENCOUNTER — Telehealth: Payer: Self-pay | Admitting: Internal Medicine

## 2010-08-03 ENCOUNTER — Encounter: Payer: Self-pay | Admitting: Internal Medicine

## 2010-08-03 ENCOUNTER — Telehealth: Payer: Self-pay | Admitting: Physician Assistant

## 2010-08-04 ENCOUNTER — Other Ambulatory Visit: Payer: Medicare Other

## 2010-08-05 ENCOUNTER — Encounter: Payer: Self-pay | Admitting: Internal Medicine

## 2010-08-05 ENCOUNTER — Encounter (INDEPENDENT_AMBULATORY_CARE_PROVIDER_SITE_OTHER): Payer: Medicare Other

## 2010-08-05 DIAGNOSIS — Z7901 Long term (current) use of anticoagulants: Secondary | ICD-10-CM

## 2010-08-05 DIAGNOSIS — I4891 Unspecified atrial fibrillation: Secondary | ICD-10-CM

## 2010-08-05 LAB — CONVERTED CEMR LAB: POC INR: 3.7

## 2010-08-05 NOTE — Assessment & Plan Note (Addendum)
Summary: rov per spouse call pt having elevated b/p and pulse and she ...   Referring Provider:  Ravissankar Avva,MD Primary Provider:  Ravisankar Avva,MD  CC:  increase bp, increase heart rate, sob, chest tightness, and edema.  History of Present Illness: Primary Electrophysiologist:  Dr. Duffy Rhody Howton is a 77 yo female with a h/o CHB, s/p pacer, Afib on coumadin therapy, HTN, hypothyroidism, COPD and bronchiectasis and anemia thought to be from chronic blood loss (AVMs).  She was seen in the fall of 2011 with cough and dyspnea that seemed to respond somewhat to lasix.  But, she was sent to pulmonary and it was felt that her symptoms were multifactorial and her pulmonary therapy has been adjusted.  She did have an echo that demonstrated newly depressed LVF with an EF 30-35%; inf., septal, distal ant., distal septal and apical HK; Gr 1 diast. dysfxn; PASP 31.  Follow up myoview on 04/06/2010 demonstrated no ischemia.  Of note, cardiac cath in 2003 demonstrated 20-30% prox LAD and EF 65%.  She called in recently with sudden weakness, increased heart rate and blood pressure.  She was added on to my schedule today.  This past weekend, she developed profound weakness while walking out to her car after eating dinner.  She fell to the ground.  She felt better shortly after this.  She denies syncope or near-syncope.  She has chronic shortness of breath with her COPD.  This is essentially unchanged.  Her coughing and chest tightness is unchanged.  She has had some headaches.  She denies any arm or jaw pain.  She has chronic nausea.  She has chronic abdominal cramping and diarrhea as well.  She has been checking her blood pressure & heart rates.  They have been running high.  She was concerned and called in to have her device interrogated telephonically.  It was apparently functioning normally.  Current Medications (verified): 1)  Qvar 40 Mcg/act Aers (Beclomethasone Dipropionate) .... Uad 2)   Rhinocort Aqua 32 Mcg/act  Susp (Budesonide) .... Inhale 1 Spray in Each Nostril Once Daily 3)  Trazodone Hcl 100 Mg Tabs (Trazodone Hcl) .... At Bedtime 4)  Cyclobenzaprine Hcl 10 Mg  Tabs (Cyclobenzaprine Hcl) .... Take 1 Tablet By Mouth Once A Day As Needed 5)  Lunesta 3 Mg  Tabs (Eszopiclone) .... Take One At Bedtime 6)  Epipen 0.3 Mg/0.67ml (1:1000)  Devi (Epinephrine Hcl (Anaphylaxis)) .... For Use As Needed 7)  Meloxicam 15 Mg  Tabs (Meloxicam) .... Take One Tablet Every Morning. 8)  Synthroid 50 Mcg  Tabs (Levothyroxine Sodium) .... Take One Tablet By Mouth Every Other Day Alternating With Synthroid 9)  Accolate 20 Mg  Tabs (Zafirlukast) .... Take One Tablet Two Times A Day 10)  Warfarin Sodium 3 Mg  Tabs (Warfarin Sodium) .... Use As Directed By Coumadin Clinic 11)  Ranitidine Hcl 150 Mg Tabs (Ranitidine Hcl) .... Take 2 Tablet At Qhs 12)  Simvastatin 10 Mg  Tabs (Simvastatin) .... Take One Tablet At Bedtime 13)  Prednisone 5 Mg Tabs (Prednisone) .... Take 1 and 1/2  Tablet By Mouth Once A Day 14)  Aricept 10 Mg Tabs (Donepezil Hcl) .... Once Daily 15)  Ventolin Hfa 108 (90 Base) Mcg/act Aers (Albuterol Sulfate) .... 2 Puffs Q4h As Needed Sob 16)  Hydrocodone-Acetaminophen 5-500 Mg  Tabs (Hydrocodone-Acetaminophen) .... For Use As Needed 17)  Tussionex Pennkinetic Er 8-10 Mg/72ml  Lqcr (Chlorpheniramine-Hydrocodone) .... One Teaspoon Every 12 Hours As Needed. 18)  Synthroid 75 Mcg  Tabs (Levothyroxine Sodium) .... Take 1 Tab By Mouth Every Other Day Alternating With 50 Mcg 19)  Xolair 150 Mg  Solr (Omalizumab) .... Injections Every 4 Weeks 20)  Cymbalta 30 Mg  Cpep (Duloxetine Hcl) .... Take 3 Tabs By Mouth Daily 21)  Calcium 600+d 600-400 Mg-Unit Tabs (Calcium Carbonate-Vitamin D) .[name].. 1 By Mouth Two Times A Day 22)  Multivitamins  Tabs (Multiple Vitamin) .... Once Daily 23)  Stool Softener 100 Mg Caps (Docusate Sodium) .... As Directed 24)  Nystatin 100000 Unit/ml Susp (Nystatin)  .[name].. 1 Tbsp Nightly 25)  Nu-Iron 150 Mg Caps (Polysaccharide Iron Complex) .... One By Mouth Two Times A Day 26)  Vitamin D3 50000 Unit Caps (Cholecalciferol) .... Once A Week 27)  Atacand 16 Mg Tabs (Candesartan Cilexetil) .... Take One Tablet By Mouth Once Daily. 28)  Furosemide 40 Mg Tabs (Furosemide) .... Take As Directed 29)  Loratadine 10 Mg Tabs (Loratadine) .... Take One By Mouth Once Daily 30)  Saline Washes .... Do Once A Day 31)  Pantoprazole Sodium 40 Mg Tbec (Pantoprazole Sodium) .[name].. 1 By Mouth Once Daily 30-60 Minutes Before Breakfast 32)  Vitamin C 500 Mg Tabs (Ascorbic Acid) .[name].. 1 By Mouth Daily 33)  Fish Oil 300 Mg Caps (Omega-3 Fatty Acids) .... 900mg  Daily 34)  Klor-Con 10 10 Meq Cr-Tabs (Potassium Chloride) .... Take 2 Tablets For 3 Days, Then Take 1 Tablet By Mouth Daily.  Allergies (verified): 1)  ! Aspirin 2)  ! Ibuprofen 3)  ! Sulfa 4)  ! Infed (Iron Dextran) 5)  ! 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) HAMMER TOE (ICD-755.66) HIATAL HERNIA (ICD-553.3) INSOMNIA (ICD-780.52) OSTEOARTHRITIS (ICD-715.90) HYPOTHYROIDISM (ICD-244.9) HYPERTENSION (ICD-401.9) ATRIAL FIBRILLATION (ICD-427.31) ASTHMA (ICD-493.90) SCIATICA (ICD-724.3) HEPATIC CYST (ICD-573.8) NEOPLASM, MALIGNANT, BREAST (ICD-174.9)  history of vocal cord dysfunction, and has been seen at the voice disorder Center at [facility] Anemia - chronic blood loss from AVM's (upper and colon - ablated 20100 Arrhythmia colon polyps COPD GERD Pneumonia Urinary Tract Infection Heart Failure 30%  Review of Systems       As per  the HPI.  All other systems reviewed and negative.   Vital Signs:  Patient profile:   77 year old female Height:      65 inches Weight:      172 pounds Pulse rate:   94 / minute BP sitting:   142 / 62  Vitals Entered By: Jacquelin Hawking, CMA (July 28, 2010 11:52 AM)  Physical Exam  General:  Well nourished, well  developed, in no acute distress HEENT: normal Neck: no JVD Cardiac:  normal S1, S2; RRR; no murmur Lungs:  decreased breath sounds bilaterally with associated rhonchi Abd: soft, nontender, no hepatomegaly Ext: trace bilateral edema Skin: warm and dry Neuro:  CNs 2-12 intact, no focal abnormalities noted    EKG  Procedure date:  07/28/2010  Findings:      multi-focal atrial rhythm with a heart rate of 94 Ventricular paced  PPM Specifications Following MD:  Lewayne Bunting, MD     PPM Vendor:  [employer]     PPM Model Number:  [id number]     PPM Serial Number:  [id number] PPM DOI:  07/20/2006      Lead 1    Location: [facility]     DOI: 09/17/1999     Model #: 9147     Serial #: [id number]     Status: active Lead 2  Location: RV     DOI: 09/17/1999     Model #: 1610     Serial #: [id number]     Status: active  Magnet Response Rate:  BOL 85 ERI 65  Indications:  CHB  Explantation Comments:  pacemaker dependent Carelink Coumadin  PPM Follow Up Pacer Dependent:  Yes      Episodes Coumadin:  Yes  Parameters Mode:  DDD     Lower Rate Limit:  60     Upper Rate Limit:  130 Paced AV Delay:  190     Sensed AV Delay:  160  Impression & Recommendations:  Problem # 1:  MULTIFOCAL ATRIAL TACHYCARDIA (ICD-427.89) I reviewed her device interrogation today with Dr. Graciela Husbands.  We also looked her electrocardiogram.  It appears that she has MAT.  This would be consistent with her lung disease.  Her heart rate averages about 110 most of the time.  She's had mode switches about 0.3% of the time.  She has had some high atrial rates which are probably representative of atrial fibrillation which she has had in the past.  It does not appear that atrial fibrillation occurred the time of her event last weekend.  In any event, she is likely experiencing high heart rates the majority of the time related to MAT.  Beta blockers may be problematic given her lung disease.  I am somewhat hesitant to put her on a calcium  channel blocker given her reduced ejection fraction.  At the direction of Dr. Graciela Husbands, we changed her device to DDD mode with an upper rate limit of 90 to essentially create an upper chamber Wenckebach.  We also placed her on magnesium.  She had a negative Myoview study last fall.  It is possible that her reduced EF may be related to her high heart rates.  I will be in touch with her pulmonologist to see if  a cardio-selective beta blocker can be used.  If not, we will place her on verapamil.  She will be brought back in followup with Dr. Ladona Ridgel in the next couple of weeks.  Problem # 2:  WEAKNESS (ICD-780.79) As above.  I will also check a CBC and basic metabolic panel today. Orders: TLB-BMP (Basic Metabolic Panel-BMET) (80048-METABOL) TLB-CBC Platelet - w/Differential (85025-CBCD)  Problem # 3:  HEART FAILURE (ICD-428.9) Her volume appears stable today.  Other Orders: EKG w/ Interpretation (93000)  Patient Instructions: 1)  Your physician recommends that you return for lab work in: BMET 780.79, CBC 780.79 2)  Your physician has recommended you make the following change in your medication: a new prescription for magnesium oxide has been sent to your pharmacy today. 3)  You have an appointment with Dr Lewayne Bunting Wednesday February 29,2012 at 11am. Prescriptions: MAGNESIUM OXIDE 400 MG TABS (MAGNESIUM OXIDE) 1 tab once daily  #30 x 11   Entered by:   Danielle Rankin, CMA   Authorized by:   Tereso Newcomer PA-C   Signed by:   Danielle Rankin, CMA on 07/28/2010   Method used:   [facility] to        [facility] [phone]* (retail)       [address]       [place], [place]  [address]       Ph: [phone]       Fax: [phone]   RxID:   [id number]  I have personally reviewed the prescriptions today for accuracy. [name] [name]  Tereso Newcomer PA-C  July 28, 2010 1:35 PM

## 2010-08-09 ENCOUNTER — Telehealth: Payer: Self-pay | Admitting: Internal Medicine

## 2010-08-09 DIAGNOSIS — I4891 Unspecified atrial fibrillation: Secondary | ICD-10-CM

## 2010-08-10 ENCOUNTER — Telehealth: Payer: Self-pay | Admitting: Internal Medicine

## 2010-08-11 NOTE — Procedures (Signed)
Summary: [facility] Device [facility]   Allergies: 1)  ! Aspirin 2)  ! Ibuprofen 3)  ! Sulfa 4)  ! Infed (Iron Dextran) 5)  ! Reglan  PPM Specifications Following MD:  Lewayne Bunting, MD     PPM Vendor:  [employer]onic     PPM Model Number:  [id number]     PPM Serial Number:  [id number] PPM DOI:  07/20/2006      Lead 1    Location: RA     DOI: 09/17/1999     Model #: 9147     Serial #: [id number]     Status: active Lead 2    Location: RV     DOI: 09/17/1999     Model #: 8657     Serial #: [id number]     Status: active  Magnet Response Rate:  BOL 85 ERI 65  Indications:  CHB  Explantation Comments:  pacemaker dependent Carelink Coumadin  PPM Follow Up Remote Check?  No Battery Voltage:  2.76 V     Battery Est. Longevity:  4 years     Pacer Dependent:  Yes      Episodes Coumadin:  Yes  Parameters Mode:  DDD     Lower Rate Limit:  60     Upper Rate Limit:  90 Paced AV Delay:  190     Sensed AV Delay:  160 Tech Comments:  Upper tracking rate reprogrammed to 90 per Dr. Laurena Spies, LPN  July 28, 2010 1:24 PM

## 2010-08-11 NOTE — Progress Notes (Signed)
Summary: blood work/2nd call  Phone Note Call from Patient Call back at Home Phone [phone]   Caller: Patient Reason for Call: Talk to Nurse Summary of Call: pt wants to have her blood work draw at [facility] please call or fax blood work orders with dx.  to  carol [facility]offit # [phone] fax# [phone]. Initial call taken by: Roe Coombs,  August 02, 2010 11:07 AM  Follow-up for Phone Call        pt states she needs the blood work order to be called in today bc the clinic only does blood work on tuesday and only until 10am.  Follow-up by: Roe Coombs,  August 02, 2010 3:50 PM  Additional Follow-up for Phone Call Additional follow up Details #1::        order faxed Dennis Bast, RN, BSN  August 02, 2010 4:01 PM

## 2010-08-11 NOTE — Progress Notes (Signed)
Summary: Start Toprol  Phone Note Outgoing Call   Summary of Call: I spoke with Dr. Delton Coombes.  It is ok for her to try a beta blocker. Please start Toprol XL 50 mg once daily. Keep follow up with Dr. Ladona Ridgel. Initial call taken by: Tereso Newcomer PA-C,  August 03, 2010 2:10 PM  Follow-up for Phone Call        CMA s/w pt's husband and gave directions for starting Toprol XL 50 mg once daily. [facility]. HUsband did state pt's BP is better 138/88 today. Advised husband Toprol will decrease bp and hr; however pt has a pacemaker. Danielle Rankin, CMA  August 03, 2010 2:48 PM     New/Updated Medications: METOPROLOL SUCCINATE 50 MG XR24H-TAB (METOPROLOL SUCCINATE) 1 tab once daily Prescriptions: METOPROLOL SUCCINATE 50 MG XR24H-TAB (METOPROLOL SUCCINATE) 1 tab once daily  #90 x 3   Entered by:   Danielle Rankin, CMA   Authorized by:   Tereso Newcomer PA-C   Signed by:   Danielle Rankin, CMA on 08/03/2010   Method used:   Electronically to        [facility] [phone]* (retail)       [address]       [place], [place]  [address]       Ph: [phone]       Fax: [phone]   RxID:   [id number]

## 2010-08-11 NOTE — Medication Information (Signed)
Summary: rov/sp  Anticoagulant Therapy  Managed by: Weston Brass, PharmD Referring MD: Sharrell Ku, MD PCP: Joycelyn Rua Supervising MD: Tenny Craw MD, Gunnar Fusi Indication 1: Atrial Fibrillation (ICD-427.31) Lab Used: [facility] Site: [facility] INR POC 3.7 INR RANGE 2 - 3  Dietary changes: no    Health status changes: yes       Details: Pt with sigificant swelling in both legs (2+ edema) and pt up 5 lbs.  Lasix stopped earlier this month due to elevated SCr and BUN.  No appt with Dr. Ladona Ridgel until end of the month.  Told pt to take furosemide 40mg  x 1 today and call tomorrow if swelling is not improved.   Bleeding/hemorrhagic complications: no    Recent/future hospitalizations: no    Any changes in medication regimen? yes       Details: started metoprolol today, decreased lasix  Recent/future dental: no  Any missed doses?: no       Is patient compliant with meds? yes      Comments: Took extra 1/2 tablet last Friday.   Allergies: 1)  ! Aspirin 2)  ! Ibuprofen 3)  ! Sulfa 4)  ! Infed (Iron Dextran) 5)  ! Reglan  Anticoagulation Management History:      The patient is taking warfarin and comes in today for a routine follow up visit.  Positive risk factors for bleeding include an age of 75 years or older and presence of serious comorbidities.  The bleeding index is 'intermediate risk'.  Positive CHADS2 values include History of CHF and History of HTN.  Negative CHADS2 values include Age > 32 years old.  The start date was 08/07/2001.  Her last INR was 5.8 RATIO.  Anticoagulation responsible provider: Tenny Craw MD, Gunnar Fusi.  INR POC: 3.7.  Cuvette Lot#: [id number].  Exp: 06/2011.    Anticoagulation Management Assessment/Plan:      The patient's current anticoagulation dose is Warfarin sodium 3 mg  tabs: use as directed by coumadin clinic.  The target INR is 2.0-3.0.  The next INR is due 08/18/2010.  Anticoagulation instructions were given to patient.  Results were reviewed/authorized by  Weston Brass, PharmD.  She was notified by Weston Brass PharmD.         Prior Anticoagulation Instructions: INR 1.7  Take 1 1/2 tablets today then resume same dose of 1 tablet every day.  Recheck INR in 3 weeks.   Current Anticoagulation Instructions: INR 3.7  Skip today's dose then resume same dose of 1 tablet every day.  Recheck INR in 3 weeks.

## 2010-08-17 NOTE — Progress Notes (Signed)
Summary: PT RETURNING CALL  Phone Note Call from Patient Call back at Home Phone [phone]   Caller: Patient Summary of Call: PT RETURNING CALL Initial call taken by: Judie Grieve,  August 10, 2010 11:01 AM  Follow-up for Phone Call        office note from 07/28/10 faxed to Dr Felipa Eth pt aware Dennis Bast, RN, BSN  August 10, 2010 12:05 PM

## 2010-08-17 NOTE — Progress Notes (Signed)
Summary: pt needs lab work faxed tp dr Vicente Males office  Phone Note Call from Patient   Caller: Patient Reason for Call: Talk to Nurse Complaint: Earache/Ear Infection Summary of Call: pt was told lab work was to be sent to dr avva's office for  2-8 and 2-16 they do not have them can we resend? fax [phone]  Initial call taken by: Glynda Jaeger,  August 09, 2010 12:23 PM  Follow-up for Phone Call        left message for pt and refaxed Dennis Bast, RN, BSN  August 09, 2010 1:25 PM

## 2010-08-18 ENCOUNTER — Encounter: Payer: Self-pay | Admitting: Cardiovascular Disease

## 2010-08-18 ENCOUNTER — Encounter (INDEPENDENT_AMBULATORY_CARE_PROVIDER_SITE_OTHER): Payer: Medicare Other

## 2010-08-18 ENCOUNTER — Encounter (INDEPENDENT_AMBULATORY_CARE_PROVIDER_SITE_OTHER): Payer: Medicare Other | Admitting: Internal Medicine

## 2010-08-18 ENCOUNTER — Encounter: Payer: Self-pay | Admitting: Internal Medicine

## 2010-08-18 DIAGNOSIS — I471 Supraventricular tachycardia, unspecified: Secondary | ICD-10-CM

## 2010-08-18 DIAGNOSIS — Z7901 Long term (current) use of anticoagulants: Secondary | ICD-10-CM

## 2010-08-18 DIAGNOSIS — I4891 Unspecified atrial fibrillation: Secondary | ICD-10-CM

## 2010-08-18 DIAGNOSIS — I442 Atrioventricular block, complete: Secondary | ICD-10-CM

## 2010-08-18 LAB — CONVERTED CEMR LAB: POC INR: 2.3

## 2010-08-26 NOTE — Cardiovascular Report (Signed)
Summary: Office Visit   Office Visit   Imported By: Roderic Ovens 08/16/2010 16:01:22  _____________________________________________________________________  External Attachment:    Type:   Image     Comment:   External Document

## 2010-08-26 NOTE — Assessment & Plan Note (Signed)
Summary: per [name]   Visit Type:  Follow-up Referring Provider:  Ravissankar Avva,MD Primary Provider:  Joylene Draft Avva,MD   History of Present Illness: [name] returns today for followup. She is a pleasant 77 yo woman with a h/o CHB,  PAF, HTN, and CHF. She has had episodes where she suddenly feels week and has palpitations. No frank syncope. No peripheral edema.  Current Medications (verified): 1)  Qvar 40 Mcg/act Aers (Beclomethasone Dipropionate) .... Uad 2)  Rhinocort Aqua 32 Mcg/act  Susp (Budesonide) .... Inhale 1 Spray in Each Nostril Once Daily 3)  Trazodone Hcl 100 Mg Tabs (Trazodone Hcl) .... At Bedtime 4)  Cyclobenzaprine Hcl 10 Mg  Tabs (Cyclobenzaprine Hcl) .... Take 1 Tablet By Mouth Once A Day As Needed 5)  Lunesta 3 Mg  Tabs (Eszopiclone) .... Take One At Bedtime 6)  Epipen 0.3 Mg/0.74ml (1:1000)  Devi (Epinephrine Hcl (Anaphylaxis)) .... For Use As Needed 7)  Meloxicam 15 Mg  Tabs (Meloxicam) .... Take One Tablet Every Morning. 8)  Synthroid 50 Mcg  Tabs (Levothyroxine Sodium) .... Take One Tablet By Mouth Every Other Day Alternating With Synthroid 9)  Accolate 20 Mg  Tabs (Zafirlukast) .... Take One Tablet Two Times A Day 10)  Warfarin Sodium 3 Mg  Tabs (Warfarin Sodium) .... Use As Directed By Coumadin Clinic 11)  Ranitidine Hcl 150 Mg Tabs (Ranitidine Hcl) .... Take 2 Tablet At Qhs 12)  Simvastatin 10 Mg  Tabs (Simvastatin) .... Take One Tablet At Bedtime 13)  Prednisone 5 Mg Tabs (Prednisone) .... Take 1 and 1/2  Tablet By Mouth Once A Day 14)  Aricept 10 Mg Tabs (Donepezil Hcl) .... Once Daily 15)  Ventolin Hfa 108 (90 Base) Mcg/act Aers (Albuterol Sulfate) .... 2 Puffs Q4h As Needed Sob 16)  Hydrocodone-Acetaminophen 5-500 Mg  Tabs (Hydrocodone-Acetaminophen) .... For Use As Needed 17)  Tussionex Pennkinetic Er 8-10 Mg/48ml  Lqcr (Chlorpheniramine-Hydrocodone) .... As Needed. 18)  Synthroid 75 Mcg  Tabs (Levothyroxine Sodium) .... Take 1 Tab By Mouth  Every Other Day Alternating With 50 Mcg 19)  Xolair 150 Mg  Solr (Omalizumab) .... Injections Every 4 Weeks in Each Arm 20)  Cymbalta 30 Mg  Cpep (Duloxetine Hcl) .... Take 3 Tabs By Mouth Daily 21)  Stool Softener 100 Mg Caps (Docusate Sodium) .... As Directed 22)  Nystatin 100000 Unit/ml Susp (Nystatin) .[name].. 1 Tbsp Nightly 23)  Nu-Iron 150 Mg Caps (Polysaccharide Iron Complex) .... One By Mouth Two Times A Day 24)  Vitamin D3 50000 Unit Caps (Cholecalciferol) .... Once A Week 25)  Atacand 16 Mg Tabs (Candesartan Cilexetil) .... Take One Tablet By Mouth Once Daily. 26)  Furosemide 20 Mg Tabs (Furosemide) .... Take One Tablet By Mouth Daily. 27)  Loratadine 10 Mg Tabs (Loratadine) .... Take One By Mouth Once Daily 28)  Pantoprazole Sodium 40 Mg Tbec (Pantoprazole Sodium) .[name].. 1 By Mouth Once Daily 30-60 Minutes Before Breakfast 29)  Vitamin C 500 Mg Tabs (Ascorbic Acid) .[name].. 1 By Mouth Daily 30)  Magnesium Oxide 400 Mg Tabs (Magnesium Oxide) .[name].. 1 Tab Once Daily 31)  Metoprolol Succinate 50 Mg Xr24h-Tab (Metoprolol Succinate) .[name].. 1 Tab Once Daily  Allergies: 1)  ! Aspirin 2)  ! Ibuprofen 3)  ! Sulfa 4)  ! Infed (Iron Dextran) 5)  ! 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) HAMMER TOE (ICD-755.66) HIATAL HERNIA (ICD-553.3) INSOMNIA (ICD-780.52) OSTEOARTHRITIS (ICD-715.90) HYPOTHYROIDISM (ICD-244.9) HYPERTENSION (ICD-401.9) ATRIAL FIBRILLATION (ICD-427.31) ASTHMA (ICD-493.90) SCIATICA (  ICD-724.3) HEPATIC CYST (ICD-573.8) NEOPLASM, MALIGNANT, BREAST (ICD-174.9)  history of vocal cord dysfunction, and has been seen at the voice disorder Center at [facility] Anemia - chronic blood loss from AVM's (upper and colon - ablated 20100 Arrhythmia colon polyps COPD GERD Pneumonia Urinary Tract Infection Heart Failure 30%  Past Surgical History: Last updated: 01/05/2009 history of multiple sinus surgeries. Appendectomy Hernia  Surgery Hysterectomy Pacemaker amputation of finger 6 eye surgeries  Review of Systems       The patient complains of dyspnea on exertion.  The patient denies chest pain, syncope, and peripheral edema.    Vital Signs:  Patient profile:   77 year old female Height:      65 inches Weight:      175 pounds BMI:     29.23 Pulse rate:   91 / minute BP sitting:   140 / 71  (left arm)  Vitals Entered By: Laurance Flatten CMA (August 18, 2010 11:12 AM)  Physical Exam  General:  Well nourished, well developed, in no acute distress HEENT: normal Neck: no JVD Cardiac:  normal S1, S2; RRR; no murmur Lungs:  decreased breath sounds bilaterally with associated rhonchi Abd: soft, nontender, no hepatomegaly Ext: trace bilateral edema Skin: warm and dry Neuro:  CNs 2-12 intact, no focal abnormalities noted    PPM Specifications Following MD:  Lewayne Bunting, MD     PPM Vendor:  Medtronic     PPM Model Number:  [id number]     PPM Serial Number:  [id number] PPM DOI:  07/20/2006      Lead 1    Location: RA     DOI: 09/17/1999     Model #: [id number]     Serial #: [id number]     Status: active Lead 2    Location: RV     DOI: 09/17/1999     Model #: [id number]     Serial #: [id number]     Status: active  Magnet Response Rate:  BOL 85 ERI 65  Indications:  CHB  Explantation Comments:  pacemaker dependent Carelink Coumadin  PPM Follow Up Pacer Dependent:  Yes      Episodes Coumadin:  Yes  Parameters Mode:  DDD     Lower Rate Limit:  60     Upper Rate Limit:  90 Paced AV Delay:  190     Sensed AV Delay:  160  Impression & Recommendations:  Problem # 1:  MULTIFOCAL ATRIAL TACHYCARDIA (ICD-427.89) The patient's symptoms are paroxysmal. She will continue her current meds. She may ultimately require additional medical therapy with calcium channel blockers. Her updated medication list for this problem includes:    Warfarin Sodium 3 Mg Tabs (Warfarin sodium) ..... Use as directed by coumadin clinic     Metoprolol Succinate 50 Mg Xr24h-tab (Metoprolol succinate) .[name]... 1 tab once daily  Problem # 2:  CARDIAC PACEMAKER IN SITU (ICD-V45.01) Her device is working normally. Will follow.  Problem # 3:  HEART FAILURE (ICD-428.9) Her symptoms are well controlled. Will follow. She will continue lasix.  Patient Instructions: 1)  Your physician recommends that you continue on your current medications as directed. Please refer to the Current Medication list given to you today. 2)  Your physician wants you to follow-up in: 6 MONTHS WITH DR Court Joy will receive a reminder letter in the mail two months in advance. If you don't receive a letter, please call our office to schedule the follow-up appointment.

## 2010-08-26 NOTE — Medication Information (Signed)
Summary: rov/sp  Anticoagulant Therapy  Managed by: Georgina Pillion, PharmD Referring MD: Sharrell Ku, MD PCP: Joycelyn Rua Supervising MD: Excell Seltzer MD, Casimiro Needle Indication 1: Atrial Fibrillation (ICD-427.31) Lab Used: [facility] Site: [facility] INR POC 2.3 INR RANGE 2 - 3  Dietary changes: no    Health status changes: no    Bleeding/hemorrhagic complications: no    Recent/future hospitalizations: no    Any changes in medication regimen? no    Recent/future dental: no  Any missed doses?: no       Is patient compliant with meds? yes       Allergies: 1)  ! Aspirin 2)  ! Ibuprofen 3)  ! Sulfa 4)  ! Infed (Iron Dextran) 5)  ! Reglan  Anticoagulation Management History:      Positive risk factors for bleeding include an age of 90 years or older and presence of serious comorbidities.  The bleeding index is 'intermediate risk'.  Positive CHADS2 values include History of CHF and History of HTN.  Negative CHADS2 values include Age > 35 years old.  The start date was 08/07/2001.  Her last INR was 5.8 RATIO.  Anticoagulation responsible provider: Excell Seltzer MD, Casimiro Needle.  INR POC: 2.3.  Cuvette Lot#: [id number].  Exp: 06/2011.    Anticoagulation Management Assessment/Plan:      The patient's current anticoagulation dose is Warfarin sodium 3 mg  tabs: use as directed by coumadin clinic.  The target INR is 2.0-3.0.  The next INR is due 09/15/2010.  Anticoagulation instructions were given to patient.  Results were reviewed/authorized by Georgina Pillion, PharmD.  She was notified by Georgina Pillion PharmD.         Prior Anticoagulation Instructions: INR 3.7  Skip today's dose then resume same dose of 1 tablet every day.  Recheck INR in 3 weeks.   Current Anticoagulation Instructions: Continue current regimen of 1 tablet (3 mg) daily.  INR 2.3

## 2010-08-31 LAB — CBC
HCT: 33.5 % — ABNORMAL LOW (ref 36.0–46.0)
MCH: 27.7 pg (ref 26.0–34.0)
MCH: 28 pg (ref 26.0–34.0)
MCHC: 31.5 g/dL (ref 30.0–36.0)
MCHC: 31.7 g/dL (ref 30.0–36.0)
MCV: 88.2 fL (ref 78.0–100.0)
MCV: 88.5 fL (ref 78.0–100.0)
Platelets: 224 10*3/uL (ref 150–400)
Platelets: 250 10*3/uL (ref 150–400)
Platelets: 272 10*3/uL (ref 150–400)
Platelets: 309 10*3/uL (ref 150–400)
RBC: 3.64 MIL/uL — ABNORMAL LOW (ref 3.87–5.11)
RBC: 3.83 MIL/uL — ABNORMAL LOW (ref 3.87–5.11)
RBC: 3.86 MIL/uL — ABNORMAL LOW (ref 3.87–5.11)
RDW: 15.9 % — ABNORMAL HIGH (ref 11.5–15.5)
RDW: 16.1 % — ABNORMAL HIGH (ref 11.5–15.5)
RDW: 16.3 % — ABNORMAL HIGH (ref 11.5–15.5)
WBC: 16.3 10*3/uL — ABNORMAL HIGH (ref 4.0–10.5)
WBC: 20.9 10*3/uL — ABNORMAL HIGH (ref 4.0–10.5)

## 2010-08-31 LAB — PROTIME-INR
INR: 2.45 — ABNORMAL HIGH (ref 0.00–1.49)
Prothrombin Time: 25.2 seconds — ABNORMAL HIGH (ref 11.6–15.2)
Prothrombin Time: 39.4 seconds — ABNORMAL HIGH (ref 11.6–15.2)

## 2010-08-31 LAB — COMPREHENSIVE METABOLIC PANEL
AST: 26 U/L (ref 0–37)
AST: 43 U/L — ABNORMAL HIGH (ref 0–37)
Albumin: 2.7 g/dL — ABNORMAL LOW (ref 3.5–5.2)
Albumin: 3.7 g/dL (ref 3.5–5.2)
BUN: 32 mg/dL — ABNORMAL HIGH (ref 6–23)
BUN: 46 mg/dL — ABNORMAL HIGH (ref 6–23)
Calcium: 8.7 mg/dL (ref 8.4–10.5)
Calcium: 9.1 mg/dL (ref 8.4–10.5)
Creatinine, Ser: 1.41 mg/dL — ABNORMAL HIGH (ref 0.4–1.2)
Creatinine, Ser: 1.77 mg/dL — ABNORMAL HIGH (ref 0.4–1.2)
GFR calc Af Amer: 34 mL/min — ABNORMAL LOW (ref >60)
GFR calc Af Amer: 44 mL/min — ABNORMAL LOW (ref >60)
GFR calc non Af Amer: 36 mL/min — ABNORMAL LOW (ref >60)
Total Bilirubin: 0.4 mg/dL (ref 0.3–1.2)
Total Protein: 5.8 g/dL — ABNORMAL LOW (ref 6.0–8.3)

## 2010-08-31 LAB — DIFFERENTIAL
Basophils Absolute: 0 10*3/uL (ref 0.0–0.1)
Basophils Absolute: 0 10*3/uL (ref 0.0–0.1)
Eosinophils Relative: 0 % (ref 0–5)
Lymphocytes Relative: 3 % — ABNORMAL LOW (ref 12–46)
Lymphocytes Relative: 5 % — ABNORMAL LOW (ref 12–46)
Lymphs Abs: 0.6 10*3/uL — ABNORMAL LOW (ref 0.7–4.0)
Lymphs Abs: 1 10*3/uL (ref 0.7–4.0)
Monocytes Absolute: 0.5 10*3/uL (ref 0.1–1.0)
Monocytes Absolute: 2.1 10*3/uL — ABNORMAL HIGH (ref 0.1–1.0)
Neutro Abs: 15.3 10*3/uL — ABNORMAL HIGH (ref 1.7–7.7)
Neutro Abs: 19.7 10*3/uL — ABNORMAL HIGH (ref 1.7–7.7)

## 2010-08-31 LAB — BASIC METABOLIC PANEL
BUN: 51 mg/dL — ABNORMAL HIGH (ref 6–23)
Calcium: 8.2 mg/dL — ABNORMAL LOW (ref 8.4–10.5)
Chloride: 91 mEq/L — ABNORMAL LOW (ref 96–112)
Creatinine, Ser: 1.44 mg/dL — ABNORMAL HIGH (ref 0.4–1.2)
GFR calc Af Amer: 34 mL/min — ABNORMAL LOW (ref >60)
GFR calc Af Amer: 43 mL/min — ABNORMAL LOW (ref >60)
GFR calc non Af Amer: 28 mL/min — ABNORMAL LOW (ref >60)
Potassium: 4.4 mEq/L (ref 3.5–5.1)
Sodium: 127 mEq/L — ABNORMAL LOW (ref 135–145)
Sodium: 128 mEq/L — ABNORMAL LOW (ref 135–145)

## 2010-08-31 LAB — CULTURE, BLOOD (ROUTINE X 2)
Culture  Setup Time: 201111212327
Culture  Setup Time: 201111212328
Culture: NO GROWTH

## 2010-08-31 LAB — BRAIN NATRIURETIC PEPTIDE
Pro B Natriuretic peptide (BNP): 187 pg/mL — ABNORMAL HIGH (ref 0.0–100.0)
Pro B Natriuretic peptide (BNP): 664 pg/mL — ABNORMAL HIGH (ref 0.0–100.0)

## 2010-08-31 LAB — LACTIC ACID, PLASMA: Lactic Acid, Venous: 3.2 mmol/L — ABNORMAL HIGH (ref 0.5–2.2)

## 2010-08-31 LAB — POCT CARDIAC MARKERS
CKMB, poc: 2 ng/mL (ref 1.0–8.0)
Troponin i, poc: <0.05 ng/mL (ref 0.00–0.09)

## 2010-09-07 NOTE — Cardiovascular Report (Signed)
Summary: Office Visit   Office Visit   Imported By: Roderic Ovens 09/03/2010 15:03:50  _____________________________________________________________________  External Attachment:    Type:   Image     Comment:   External Document

## 2010-09-15 ENCOUNTER — Ambulatory Visit (INDEPENDENT_AMBULATORY_CARE_PROVIDER_SITE_OTHER): Payer: Medicare Other | Admitting: *Deleted

## 2010-09-15 DIAGNOSIS — I4891 Unspecified atrial fibrillation: Secondary | ICD-10-CM

## 2010-09-15 LAB — POCT INR: INR: 1

## 2010-09-15 NOTE — Patient Instructions (Signed)
INR 1.0 Take 2 tablets today and tomorrow.  Then, resume taking 1 tablet daily. Recheck in 2 weeks.

## 2010-09-25 LAB — CBC
HCT: 30.7 % — ABNORMAL LOW (ref 36.0–46.0)
Platelets: 252 10*3/uL (ref 150–400)
RDW: 24.8 % — ABNORMAL HIGH (ref 11.5–15.5)

## 2010-09-30 ENCOUNTER — Encounter: Payer: Medicare Other | Admitting: *Deleted

## 2010-10-08 ENCOUNTER — Other Ambulatory Visit: Payer: Self-pay | Admitting: Internal Medicine

## 2010-10-08 DIAGNOSIS — Z1231 Encounter for screening mammogram for malignant neoplasm of breast: Secondary | ICD-10-CM

## 2010-10-21 ENCOUNTER — Ambulatory Visit (INDEPENDENT_AMBULATORY_CARE_PROVIDER_SITE_OTHER): Payer: Medicare Other | Admitting: *Deleted

## 2010-10-21 ENCOUNTER — Ambulatory Visit: Payer: Medicare Other

## 2010-10-21 DIAGNOSIS — I4891 Unspecified atrial fibrillation: Secondary | ICD-10-CM

## 2010-10-25 ENCOUNTER — Other Ambulatory Visit: Payer: Self-pay | Admitting: Emergency Medicine

## 2010-11-02 NOTE — Assessment & Plan Note (Signed)
[facility]                         ELECTROPHYSIOLOGY OFFICE NOTE   NAME:HIPPNychelle, Cassata                         MRN:          [id number]  DATE:11/06/2007                            DOB:          Feb 09, 1934    Ms. Dobbin returns today for followup.  She is very pleasant 77 year old  woman with a history of complete heart block, status post pacemaker  insertion who has otherwise very severe lung disease with bronchiectasis  and recurrent pneumonia.  She returns today in followup.  She does note  that she was recently placed on steroids by her allergist for an asthma  flare and otherwise she has been stable.  Her current medications  include Simcor inhalers, Spiriva, Raniclor, calcium, trazodone,  cyclobenzaprine, Lunesta, prednisone 30 a day and Mucinex.   PHYSICAL EXAM:  She is a pleasant 77 year old woman in no acute  distress.  The blood pressure was 130/59, the pulse 46 and regular,  respirations were 18.  The weight was 167 pounds.  NECK:  Revealed no jugular distention.  LUNGS:  Clear bilaterally to auscultation.  No wheezes, rales or rhonchi  are present.  There is no increased work of breathing.  CARDIAC EXAM:  Distant with a regular rate and rhythm with normal S1 and  S2.  The PMI was not enlarged or laterally displaced.  ABDOMINAL EXAM:  Soft, nontender.  There is no organomegaly.  Bowel  sounds are present.  There is no rebound or guarding.  EXTREMITIES:  Demonstrated no edema.    Interrogation of her pacemaker demonstrates a Medtronic SENSIA.  P-waves  were between 1 and 2.8. The R wave was greater than 15, impedance was  490 in the atrium, 568 in the ventricle, threshold 0.625 and 0.4 in the  A, 0.375 and 0.4 in the V.  The battery voltage was 2.76 volts.  She had  143 mode switches, the longest of which was less than a few minutes.  There are no real high rate episodes above 180 beats per minute.  She  was 99% V paced, 2% A paced.   IMPRESSION:  1. Symptomatic complete heart block.  2. Status post pacemaker insertion.  3. Severe underlying lung disease.   DISCUSSION:  Overall Ms. Liskey is stable.  She does feel symptomatic  palpitations and I think these are PACs and PVCs.  I will plan to see  the patient back in the office in 1 year for pacemaker followup, sooner  should she have additional problems.     Doylene Canning. Ladona Ridgel, MD  Electronically Signed    GWT/MedQ  DD: 11/06/2007  DT: 11/06/2007  Job #: [id number]   cc:   Larina Earthly, M.D.

## 2010-11-02 NOTE — Assessment & Plan Note (Signed)
[facility]                         ELECTROPHYSIOLOGY OFFICE NOTE   NAME:HIPPMarisella, [name]                         MRN:          [id number]  DATE:11/07/2006                            DOB:          [birth year]    [name] returns today for followup.  She is a very pleasant woman with  complete heart block and bronchiectasis and otherwise rule out chronic  lung disease.  Also with a history of gastroesophageal reflux disease.  She several months ago was found to be at [facility] on her pacemaker and  underwent pacemaker generator change.  She returns today for followup.  Her main complaint today is that of chronic cough which has been  worsening over the last several days but present for approximately 1-1/2  weeks.  She denies any associated fevers or chills.  She denies chest  pain.  She has had no increased dyspnea except for very minimal  shortness of breath with exertion.   EXAM:  She is a pleasant, well-appearing, woman in no distress.  Blood  pressure was 139/77, the pulse was 90 and regular, the respirations were  18, the weight was 161 pounds.  NECK:  No jugular venous distention.  LUNGS:  Clear bilaterally to auscultation.  There was no egophony, I did  not hear any bronchial breath sounds.  CARDIOVASCULAR EXAM:  A regular rate and rhythm with normal S1 and S2.  There was a split S2, there are no obvious murmurs.  ABDOMINAL EXAM:  Soft, nontender, nondistended.  EXTREMITIES:  Demonstrated no cyanosis, clubbing or edema.   Interrogation of her pacemaker demonstrates a Medtronic Carroll with P  waves greater than 5 and R waves of 11, the impedance 415 in the atrium,  589 in the ventricle, the threshold 0.75 at 0.4 in the atrium, 0.5 at  0.4 in the right ventricle.  Battery voltage was 2.76 volts.   IMPRESSION:  1. Complete heart block.  2. Bronchiectasis and underlying chronic lung disease.  3. Status post pacemaker insertion.   DISCUSSION:  Overall,  [name] is stable and her pacemaker is working  normally.  Will plan to see her back for pacemaker follow up in January  of 2009.     Doylene Canning. Ladona Ridgel, MD  Electronically Signed    GWT/MedQ  DD: 11/07/2006  DT: 11/07/2006  Job #: [id number]   cc:   Larina Earthly, M.D.

## 2010-11-05 NOTE — Discharge Summary (Signed)
NAME:  [name], [name]                            ACCOUNT NO.:  [id number]   MEDICAL RECORD NO.:  [id number]                   PATIENT TYPE:  INP   LOCATION:  0468                                 FACILITY:  [facility]   PHYSICIAN:  Rose Phi. Maple Hudson, M.D.                DATE OF BIRTH:  [birth year]   DATE OF ADMISSION:  11/20/2003  DATE OF DISCHARGE:  11/25/2003                                 DISCHARGE SUMMARY   HISTORY OF PRESENT ILLNESS:  This 77 year old female had been operated on  for a low-grade early-stage carcinoma of the left breast, and had a partial  breast radiation using MammoSite catheter earlier in the winter of 2004.  She then developed apparently a fairly severe cellulitis and lymphedema of  the left arm.  She was seen in our office by Dr. Leonie Man on the  afternoon, and because of the worsening cellulitis, she was admitted to the  hospital.   The only significant physical findings had to do with cellulitis.  She did  have a history of having had a pharyngeal cellulitis with a severe infection  earlier in the year.   She was admitted to the hospital with the physical findings, primarily  swelling of the left hand with some redness to the mid forearm, there were  no open wounds, and no obvious cause of this.  She was placed on antibiotics  and treated for several days with gradual resolution.  Our feeling was that  it was not really related to her previous cancer treatment.  She was also  seen in consultation by Dr. Tennis Must. Meyerdierks, with a diagnosis of  resolving cellulitis of the left hand, probably not related to her previous  treatment.   ADMISSION LABORATORY DATA:  All basically within normal limits.   X-rays and CT of the left hand revealed no foreign bodies or any other  explanation.   She was then discharged, to be followed as an outpatient on IV antibiotics.   FINAL DIAGNOSES:  1. Cellulitis.  2. Lymphedema of the left hand and arm of uncertain  etiology.   SURGERY COMPLICATIONS:  Treatment with IV antibiotics, complications none.   CONDITION ON DISCHARGE:  Improved.   DISCHARGE MEDICATIONS:  1. IV antibiotics.  2. Continue her IV Unasyn at 3 grams every six hours.   FOLLOWUP:  She was to be followed up as an outpatient.                                               Rose Phi. Maple Hudson, M.D.   PRY/MEDQ  D:  02/02/2004  T:  02/02/2004  Job:  [id number]

## 2010-11-05 NOTE — Op Note (Signed)
   NAME:  [name], [name]                            ACCOUNT NO.:  [id number]   MEDICAL RECORD NO.:  [id number]                   PATIENT TYPE:  AMB   LOCATION:  DSC                                  FACILITY:  [facility]   PHYSICIAN:  Rose Phi. Maple Hudson, M.D.                DATE OF BIRTH:  1933/12/16   DATE OF PROCEDURE:  08/20/2002  DATE OF DISCHARGE:                                 OPERATIVE REPORT   PREOPERATIVE DIAGNOSIS:  Early stage I carcinoma of the left breast.   POSTOPERATIVE DIAGNOSIS:  Early stage I carcinoma of the left breast.   PROCEDURE:  Insertion of MammoSite with ultrasound guidance.   SURGEON:  Rose Phi. Maple Hudson, M.D.   ANESTHESIA:  General.   DESCRIPTION OF PROCEDURE:  After a suitable general anesthesia was induced,  the patient was placed in a supine position with the left arm extended on  the arm board.  The left breast was prepped and draped in the usual fashion.  We then scanned the cavity site, which looked like it measured about 4 cm in  diameter in the upper inner quadrant of her left breast without any  septations.  An incision was made in the inferior lower medial quadrant of  the left breast and the trocar inserted under ultrasound guidance into the  cavity.  It actually took [place] a couple of times in order to get it in the  correct position.  With the trocar in the correct position, we then removed  it and we had a considerable drainage of the seroma.  The MammoSite catheter  was then inserted into the space and again this took a couple of tries, but  once we got it properly positioned we then blew the balloon up and  ultrasound confirmed proper positioning of the balloon with an at least 1 cm  gap between the balloon and the skin surface.  Once this was properly  inflated and seated, we then put the dressings on and she was then  transferred to the recovery room in satisfactory condition, having tolerated  the procedure well.                Rose Phi. Maple Hudson, M.D.    PRY/MEDQ  D:  08/20/2002  T:  08/20/2002  Job:  [id number]   cc:   Genelle Bal, MD, [facility]

## 2010-11-05 NOTE — Assessment & Plan Note (Signed)
 [facility]                         ELECTROPHYSIOLOGY OFFICE NOTE   NAME:HIPPMarielena, [name]                         MRN:          [id number]  DATE:07/14/2006                            DOB:          Jul 25, 1933    Ms. Armstead returns today for followup.  She is a very pleasant woman with  a history of complete heart block and very little ventricular escape who  is status post pacemaker insertion.  She has a history of severe COPD  and bronchiectasis who was found on trans telephonic monitoring to be at  what appeared to be ERI.  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  On exam today, she  is a pleasant woman in no acute distress.  The blood pressure was  130/80, the pulse was 60 and regular, the respirations were 18.  Neck  revealed no jugular venous distention.  Lungs are clear bilaterally to  auscultation.  The lung revealed scattered rhonchi but no wheezes or  rales.  There is no increased work of breathing.  Cardiovascular exam  revealed a regular rate and rhythm with normal S1 and S2.  Extremities  demonstrate no cyanosis, clubbing or edema.   EKG demonstrates sinus rhythm with ventricular pacing, dyssynchronously.   Interrogation of her pacemaker demonstrates a Medtronic CAPA 700 with R-  waves of 11, pace impedence of 630 ohms, threshold 0.5 at 0.4.  The  battery voltage was 2.63 volts (ERI since December 21).   IMPRESSION:  1. Complete heart block.  2. Status post pacemaker, now ERI.   DISCUSSION:  I discussed treatment options with the patient.  I plan to  proceed with pacemaker generator change next week.     Doylene Canning. Ladona Ridgel, MD  Electronically Signed    GWT/MedQ  DD: 07/14/2006  DT: 07/14/2006  Job #: [id number]   cc:   Larina Earthly, M.D.

## 2010-11-05 NOTE — Consult Note (Signed)
NAME:  [name], [name]                            ACCOUNT NO.:  [id number]   MEDICAL RECORD NO.:  [id number]                   PATIENT TYPE:  INP   LOCATION:  2029                                 FACILITY:  [facility]   PHYSICIAN:  Jesse Sans. Wall, M.D.                DATE OF BIRTH:  09/24/1933   DATE OF CONSULTATION:  DATE OF DISCHARGE:                                   CONSULTATION   CARDIOLOGY CONSULTATION   REASON FOR CONSULTATION:  We were asked by Dr. Timothy Lasso and also Dr. Beverely Pace to  evaluate [name] for possible new murmur, question of SVE with fever  and pneumonia.   HISTORY OF PRESENT ILLNESS:  She has had a 1 month history of increased  shortness of breath and dyspnea on exertion. She has had a cough which with  yellow sputum production.  She has had decreased appetite for days.  She had  a temperature of 99.4 this morning.  She has had some swelling in her right  neck this morning.  Her throat is sore and it is difficult to swallow.  She  has noticed an increased heart rate and some decreased wheezing. She denies  any neurological symptoms.  She has had no painful digits including fingers  or change in coloration of her palms. She has had no visual complaints.   In the emergency room she was noted to have a left lower lobe pneumonia on  chest x-ray.  Her white count is 28,000.   PAST MEDICAL HISTORY:  She has a history of hypertension and hyperlipidemia.  She has a history of atrial fibrillation and flutter on chronic Coumadin.  She has had a Medtronic Cappa placed in March of 2001 in [place].  Catheterization on February 21, 2003 showed a 20-30% mid-LAD, a regular  circumflex and ramus intermedius, EF of 65%.   She has a history of asthma, frequent bronchiectasis with flares, arthritis,  DJD, and spasmodic dystonia.  She has had a left mastectomy secondary to  cancer in January 2004.   ALLERGIES:  She is intolerant of ASPIRIN and SULFA.   MEDICATIONS AT HOME:  1.  Coumadin 3 mg every day except 4.5 on Mondays.  This is followed at our     Coumadin Clinic at [facility].  2. Prednisone 20 mg daily for the last month.  3. Advair 500/50 one b.i.d.  4. Xolair every 4 weeks.  5. Vicodin p.r.n.  6. Levaquin x38 days.  7. Nexium 40 mg t.i.d.  8. Accolate 20 mg b.i.d.  9. Albuterol p.r.n.  10.      Mobic 25 mg a day,  11.      Synthroid 0.75 mg daily.  12.      Tylenol 2 q.h.s. p.r.n.  13.      Zantac 150 two q.h.s.  14.      Zocor 10 mg p.o.  q.h.s.  15.      Rhinocort 32 mcg q.a.m.  16.      Diovan 160 b.i.d.  17.      Trazodone 50 to 100 mg q.h.s.  18.      Benadryl 20 mg q.h.s.  19.      Lexapro 20 mg q.h.s.  20.      Norvasc 2.5 q.h.s.  21.      Tamoxifen 10 mg b.i.d.  22.      Ambien 10 mg p.o. q.h.s.   SOCIAL HISTORY:  She lives in [place] with her husband who is a  Development worker, community.  He is here tonight.  She has two sons and one daughter.  She  quit smoking 20 years ago.  She has two scotchs per week.  She is currently  disabled.  She was a Runner, broadcasting/film/video fifth and sixth grade.   FAMILY HISTORY:  Noncontributory.   REVIEW OF SYSTEMS:  Other than the HPI is noncontributory.   PHYSICAL EXAMINATION:  GENERAL:  She is chronically ill-appearing.  VITAL SIGNS:  Blood pressure 118/28, heart rate was 105 and consistent with  sinus tachycardia with ventricular pacing.  Temperature is 100.9,  respiratory rate is 20.  O2 saturation is 92% on 2 liters.  HEENT:  She wears glasses.  NECK:  Shows a right neck mass which is probably a lymph node.  CARDIOVASCULAR:  Slightly irregular rhythm.  There is a very soft 1/6  systolic murmur that is short in nature along the left sternal border.  No  diastolic murmurs appreciated.  LUNGS:  She has course breath sounds bilaterally particularly in the left  base.  ABDOMEN:  Exam is soft with good bowel sounds.  There is no obvious  tenderness.  EXTREMITIES:  Reveal no signs of inflamed hemorrhages, Osler's  nodes, or  Janeway lesions.  NEUROLOGIC:  Exam is grossly intact.   LABORATORY DATA:  Chest x-ray shows a left lower lobe pneumonia.  EKG:  Sinus tachycardia with ventricular pacing and occasional PVC.   Hemoglobin 12.6, white count 28,000.  BUN 28, creatinine 1.7.  LFTs were  normal.  INR was 4.5.  UA showed some bacteria and was nitrite positive.   ASSESSMENT AND RECOMMENDATIONS:  1. Left lower lobe pneumonia.  With her fever, elevated white count, not to     mention her chronic respiratory problems; this could be fairly     complicated.  She is fairly sick.  I think that her murmur is a benign     flow murmur.  We can check a 2-D echocardiogram to rule out vegetations.     I am not certain why her diastolic pressure is so low. I can also check     two blood cultures and a urine culture if appropriate. She is being given     antibiotics tonight per Dr. Timothy Lasso whom I have discussed the case with.  2. Atrial fibrillation/flutter on chronic Coumadin.  She has a pacemaker     which is pacing the ventricular chamber appropriately.   Thank you for the consultation. We will follow with you.                                               Thomas C. Wall, M.D.    TCW/MEDQ  D:  05/21/2003  T:  05/21/2003  Job:  [id number]  cc:   [facility]   Rollene Rotunda, M.D.   Doylene Canning. Ladona Ridgel, M.D.

## 2010-11-05 NOTE — H&P (Signed)
NAME:  [name], [name]                            ACCOUNT NO.:  [id number]   MEDICAL RECORD NO.:  [id number]                   PATIENT TYPE:  INP   LOCATION:  0468                                 FACILITY:  [facility]   PHYSICIAN:  Sandria Bales. Ezzard Standing, M.D.               DATE OF BIRTH:  06-24-1933   DATE OF ADMISSION:  11/20/2003  DATE OF DISCHARGE:                                HISTORY & PHYSICAL   HISTORY OF PRESENT ILLNESS:  This is a very complicated 77 year old white  female patient of Dr. Larina Earthly who has developed swelling and redness in  her left hand several days ago.  She saw Dr. Francina Ames on the 75th of  May, [address] and then saw him again today, the 2nd of June, 2005.  She was  placed on Keflex about two days ago.  Has had about 8 or 9 tablets.  Was  then sent to the lymphedema clinic the other day because of increased  concerns of cellulitis.  Came and saw Dr. Lurene Shadow this afternoon.  He was  worried about worsening cellulitis and then was directed for admission to  [facility].   Significantly, the patient was hospitalized from the 30th of November to the  8th of December, 2004 with a parapharyngeal cellulitis secondary to group A  streptococcus.  Although I think this infection is unrelated to that  infection, I think it shows she is fully predisposed to unusual infections.   ALLERGIES:  She is allergic to ASPIRIN and SULFA.   CURRENT MEDICATIONS:  1. Mobic 15 mg daily.  2. Synthroid alternating between 0.075 and 0.05 mg daily.  3. Accolate 20 mg b.i.d.  4. Coumadin 3 mg on Monday, Wednesday, Friday 4.5 mg on the other days.  5. Nexium 40 mg in the morning and at night.  6. Ranitidine 50 mg daily.  7. Bupropion 100 mg in the morning.  8. Zocor 10 mg nightly.  9. Prednisone 10 mg daily.  10.      Diovan 160 mg in the morning and 160 mg at night.  11.      Norvasc 2.5 mg nightly.  12.      Tamoxifen 10 mg in the morning and 10 mg at night.  13.      Mucinex DM.  14.      Albuterol inhaler 2 puffs 4 times a day.  15.      Atrovent inhaler 2 puffs twice daily.  16.      Pulmicort inhaler 4 puffs in the morning and 4 puffs at night.  17.      Foradil Aerolizer 12 mcg capsule in the morning and at night.  18.      Spiriva HandiHaler.  19.      Rhinocort.  20.      Calcium.  21.      One-A-Day vitamins.  22.  Black cohosh.  23.      Lorazepam.  24.      Trazodone.  25.      Ambien.  26.      Cyclobenzaprine.   REVIEW OF SYSTEMS:  1. Significant from a pulmonary standpoint.  She has been diagnosed with     bronchiectasis in the right middle lobe and asthma.  She sees Dr. Lucie Leather     as an allergist for exacerbation of this.  2. She has been on longstanding prednisone because of these problems.  3. She has atrial fibrillation.  4. She is anticoagulated with Coumadin because of the atrial fibrillation.  5. She has a left subclavian pacemaker followed by Dr. Lewayne Bunting.  6. She is hypothyroid, on thyroid replacement.  7. Gastroesophageal reflux disease with hiatal hernia.  8. Hypercholesterolemia.  9. She had a left breast cancer treated with lumpectomy.  A mammocyte     radiation in March, 2004 by Dr. Francina Ames.  10.      She is hypertensive.  11.      She has chronic low back pain and degenerative disk disease.   PHYSICAL EXAMINATION:  VITAL SIGNS:  Temperature 98, pulse 95, respirations  16, blood pressure 156/84.  GENERAL:  She is a well-developed and well-nourished, pleasant white female,  alert and cooperative.  HEENT:  Unremarkable.  NECK:  Supple.  She has no thyromegaly.  No lymphadenopathy.  BREAST:  Her left breast wound is well healed.  She has a pacemaker in her  left subclavian area.  Right breast is unremarkable.  EXTREMITIES:  She has some swelling of her left hand with some redness up to  her, maybe mid forearm.  She has a little open wound on the elbow.  Whether  this is the questionable source of infection, there is  no swelling or  cellulitis in that area.  Her upper arm and right arm are unremarkable.  LUNGS:  Clear to auscultation.  HEART:  Regular rate and rhythm.  ABDOMEN:  Soft.   I have no labs or other x-rays.   IMPRESSION:  1. Cellulitis of the left hand, which is questionably worsened on oral     antibiotics.  She is admitted for IV antibiotics and elevation, although     at this time, it is already 9:30 at night.  She is not septic.  I am not     sure if she really needs a PICC line placed at that time.  We will try to     arrange this for the first thing in the morning and give her oral     antibiotics tonight.  I will check her coags and other things.  2. On anticoagulation for atrial fibrillation.  3. Longstanding prednisone.  This probably makes this a little bit more     complicated to deal with.  4. Bronchiectasis asthma.  5. Atrial fibrillation.  6. Left subclavian pacemaker.  7. Gastroesophageal reflux disease and hiatal hernia.  8. Hypercholesterolemia.  9. History of left breast cancer.  10.      Hypertension.  11.      Low back pain.                                               Sandria Bales. Ezzard Standing, M.D.    DHN/MEDQ  D:  11/20/2003  T:  11/20/2003  Job:  [id number]   cc:   Larina Earthly, M.D.  [address]  [place]  [place] [address]  Fax: [phone]   Jessica Priest, M.D.  [address]  [place] [address]  Fax: [phone]   Doylene Canning. Ladona Ridgel, M.D.   Rose Phi. Young, M.D.  [address], [address]  [place]  [place] [address]  Fax: [phone]

## 2010-11-05 NOTE — H&P (Signed)
NAME:  [name], [name]                  ACCOUNT NO.:  [id number]   MEDICAL RECORD NO.:  [id number]          PATIENT TYPE:  INP   LOCATION:  6709                         FACILITY:  [facility]   PHYSICIAN:  Gaspar Garbe, M.D.DATE OF BIRTH:  1933/10/13   DATE OF ADMISSION:  10/21/2005  DATE OF DISCHARGE:                                HISTORY & PHYSICAL   CHIEF COMPLAINT:  Shortness of breath, question pneumonia, asthma  exacerbation.   HISTORY OF PRESENT ILLNESS:  Patient is a 77 year old white female with a  history of asthma, who is followed by Dr. Lucie Leather.  She was seen in his  office today and notes a one week course of worsening symptoms.  She has  nebulizers at home but neglected to use these and did not contact a  physician earlier in this.  In fact, she had switched appointments with her  husband, who was actually assigned to see Dr. Lucie Leather today.  Because of her  worsening shortness of breath, she had to be brought into the office in  wheelchair because she was not able to walk.  She was given prednisone,  Avelox, and some sort of inhaler in his office, after which I was called by  Dr. Lucie Leather, indicating that the patient required admission.  In spite of my  recommendation, he chose to send her through the emergency room instead of  directly admitting her, as I believe she does not have admitting privileges;  therefore we are continuing the care that he is unable to give because he  does not have admitting privileges.   Workup at this point indicates that there is a left lower lobe question of  pneumonia versus atelectasis on x-ray.  The patient has recently received  nebulizers in the meantime, and they indicate that her breathing is  considerably better than it was this morning, she is still on oxygen and has  not gotten up and out of bed.  She has had multiple episodes of shortness of  breath related to this but they are better over the past year.   ALLERGIES:  SULFA,  ASPIRIN.   MEDICATIONS:  1.  Mucinex p.r.n.  2.  Mobic 15 mg p.o. q.a.m.  3.  Synthroid 75/50 mcg alternating days.  4.  Accolate 20 mg p.o. b.i.d.  5.  Coumadin.  6.  Nexium 40 mg daily.  7.  Zocor 10 mg daily.  8.  Prednisone 5 mg daily chronically.  She is prednisone-dependent.  9.  Diovan 320 mg p.o. daily.  10. Norvasc 2.5 mg p.o. daily.  11. Tamoxifen 10 mg p.o. b.i.d.  12. Depakote ER 500 mg q.h.s.  13. Patient uses five inhalers consisting of albuterol, Atrovent, Foradil,      Spiriva, and Pulmicort.  14. Wellbutrin 100 mg p.o. daily.  15. Aricept 5 mg p.o. daily.  16. Boniva monthly.  17. Trazodone 100 mg p.o. q.h.s., Flexeril 10 mg p.o. q.h.s. p.r.n. for      sleep, Lunesta 3 mg p.o. q.h.s.  These are chronic sleeping medicines      for her.  PAST MEDICAL HISTORY:  1.  Chronic asthma.  2.  Atrial fibrillation.  3.  Hypothyroidism with multinodular goiter.  4.  Breast cancer, left breast, diagnosed in 2004.  5.  History of mood disorder.  6.  Gastroesophageal reflux disease.  7.  Hypertension.  8.  Chronic low back pain.   PAST SURGICAL HISTORY:  Patient has had pacer implantation and umbilical  hernia repair.   SOCIAL HISTORY:  Patient lives in [place] with her husband.  She retired  secondary to asthma.  Her husband is a former Development worker, community.  They use to live  in [place], [place]. She is married with three children.  She has an  approximate 20-pack-year smoking history but quit back in the 1960s because  of her husband's heart attack.  She drinks one alcoholic drink every two  weeks.   FAMILY HISTORY:  Cancer, atrial fibrillation, and osteoporosis on her mom's  side, and history of respiratory difficulties on her father's side,  including TB and undiagnosed respiratory problems.   REVIEW OF SYSTEMS:  Patient denies fever, chills, or sweats at this time but  notes dyspnea on exertion, cough, and wheezing, which has been progressive  over the past week.   Denies any pitting edema or any increase in her weight.  Denies any GI symptoms.  Denies any upper respiratory-type symptoms and has  no difficulty swallowing.  Other systems are negative.   ADVANCED DIRECTIVES:  Patient indicates that she wishes to be do not  resuscitate/do not intubate and has advanced directives on file here at  [facility].   PHYSICAL EXAMINATION:  VITAL SIGNS:  Temperature 99, pulse 72, respiratory  rate 18, satting 94% on 2 liters, blood pressure 136/82.  GENERAL:  No acute distress at present.  Improved from earlier in the  emergency room, per notes.  HEENT:  Normocephalic and atraumatic.  PERRLA.  EOMI.  ENT is within normal  limits.  NECK:  Supple.  No lymphadenopathy, JVD, or bruits.  LUNGS:  Patient has bilateral expiratory wheezes throughout.  Consolidation  is noted.  No crackles are noted at the base with inspiration.  HEART:  Atrial fibrillation.  No murmur, rub or gallop are appreciated.  ABDOMEN:  Soft and nontender.  Normoactive bowel sounds.  No  hepatosplenomegaly.  EXTREMITIES:  No clubbing, cyanosis or edema is appreciated.  MUSCULOSKELETAL:  No joint deformities, effusions, or tenderness are noted.  NEUROLOGIC:  Oriented x3.  Cranial nerves II-XII are intact.  Strength is  5/5 bilaterally.   Chest x-ray shows a question of left lower lobe pneumonia versus  atelectasis, left greater than right.   LABS:  White count elevated at 15.8, hemoglobin 11.6, hematocrit 33.4,  platelets 218.  BUN and creatinine are 21 and 1.2, respectively.  Glucose  107.  There is no anion gap.  Potassium is normal at 4.6.  Sodium is  slightly decreased at 130.  Total bilirubin 0.6, AST 27, ALT 23, alk phos  53, albumin 3.   ASSESSMENT/PLAN:  1.  Asthma exacerbation secondary to waiting one week with ongoing symptoms      in order to get medical attention.  Indicated to her that since she does     have a nebulizer at home, she needs to use when she feels the  worsening      of her symptoms and contact a physician before getting to the point that      she cannot walk.  She is very hesitant to have increases of  her      prednisone, but given the fact that she is on five inhalers and is      already using Accolate, the most likely is not on theophylline secondary      to her heart problems.  Prednisone and antibiotics are the only other      option other than more inhalers, and nebulizers need to be used earlier      rather than later in the course of her asthma exacerbation.  Continue      her on her Accolate, put her on Xopenex, nebs, inhaled Atrovent, and      switched her to Advair discus per hospital formulary. Will titrate her      O2 down and try to do a six minute walk on pulse ox tomorrow.  2.  Atrial fibrillation:  She is currently rate-controlled.  Coumadin will      be adjusted per pharmacy.  3.  History of breast cancer:  Will continue tamoxifen.  No evidence of      masses on chest x-ray.  4.  Patient takes Aricept for memory issues.  She is not quite certain why      and neither is her husband; however, we will continue these.  5.  Trazodone and Depakote for anxiety/mood disorder.  6.  Will continue her PPI for her GI prophylaxis.  She is on Coumadin but      does not require Lovenox for DVT prophylaxis.  7.  Will continue her Lunesta, trazodone, and p.r.n. Flexeril regimen for      sleep aids.  8.  Hypertension:  Currently reasonably well controlled.  Will continue on a      combination of Diovan and Norvasc.  9.  Hypothyroidism with MNG:  Continue current thyroid replacement regimen.      Gaspar Garbe, M.D.  Electronically Signed     RWT/MEDQ  D:  10/21/2005  T:  10/21/2005  Job:  [id number]   cc:   Larina Earthly, M.D.  Fax: [phone]   Jessica Priest, M.D.  Fax: [phone]

## 2010-11-05 NOTE — H&P (Signed)
NAME:  [name], [name]                            ACCOUNT NO.:  [id number]   MEDICAL RECORD NO.:  [id number]                   PATIENT TYPE:  INP   LOCATION:  1823                                 FACILITY:  [facility]   PHYSICIAN:  Gwen Pounds, M.D.                 DATE OF BIRTH:  Apr 30, 1934   DATE OF ADMISSION:  05/20/2003  DATE OF DISCHARGE:                                HISTORY & PHYSICAL   PRIMARY CARE Nolberto Cheuvront:  Dr. Felipa Eth.   PRIMARY EAR/NOSE/THROAT:  Dr. Gerilyn Pilgrim.   ALLERGIST:  Dr. Lucie Leather.   CARDIOLOGIST:  Dr. Chales Abrahams.   PULMONOLOGIST:  Dr. Sung Amabile.   CHIEF COMPLAINT:  Fever, shortness of breath, diaphoresis, new murmur, new  acute right neck mass, cough, and pleuritic chest pain.   HISTORY OF PRESENT ILLNESS:  A 77 year old female with multiple medical  problems with a heart catheterization in September 2003, which showed  nonobstructive coronary disease.  No valvular abnormalities and normal EF  and also has been on chronic steroids up to 20 mg of prednisone for her  asthma and recently has taken Levaquin for approximately 30 days plus, who  was sent over here from Dr. Kathyrn Lass office by EMS to the emergency  department for a fever of 98.9 on her high steroids, looking very ill, to  evaluate her right anterior cervical neck mass that has been there for 24-36  hours.  Recent 3-4 days of sore throat, dry mouth, a new cardiac murmur,  shortness of breath, mild hypoxia with saturations 91% on room air and  diaphoresis.  He wanted to rule out pneumonia, myocardial infarction, and  bacterial endocarditis plus or minus other diagnoses on her differential  diagnosis.  In the ED, she was diagnosed with left lower lobe left lingular  treated-acquired pneumonia and given IV Rocephin 2 g IV x 1.  She was given  Xopenex and Atrovent nebulizer treatments.  On discussion, she has been  having a lot of coughing, pleuritis chest pain, although she does feel  somewhat better since been getting some  oxygen.  When I talked to Dr. Lucie Leather  on the phone, he informed me that Dr. Lazarus Salines is aware of her neck mass.   PAST MEDICAL HISTORY:  1. Early stage I breast cancer, status post lumpectomy, on Tamoxifen     treatment.  2. Catheterization, September 2003, nonobstructive coronary disease, normal     LV function per Dr. Chales Abrahams.  3. Atrial flutter with high AV block, status post pacer.  4. Paroxysmal atrial fibrillation, on Coumadin, followed by [facility]     [facility]iology.  5. Asthma.  6. Asthmatic bronchitis.  7. Multiple pneumonias.  8. Bronchiectasis.  9. Hyperlipidemia.  10.      Hypertension.  11.      GERD.  12.      Low back pain.  13.      Hypothyroid.  14.  Repaired umbilical hernia repair.  15.      Osteoarthritis with degenerative disk disease.  16.      History of pansinusitis.  17.      History of endometriosis.  18.      History of spasmodic dystonia status post Botox treatment.  19.      Immotile cilia disease.  20.      Broken ribs, status post with coughing episodes.   ALLERGIES:  1. SULFA.  2. ASPIRIN.   FAMILY HISTORY:  Coronary disease.   SOCIAL HISTORY:  She is married with children.  She quit tobacco at 37 years  old.  She moved from [place] with her physician husband, who was the plant  medical doctor for [employer].   MEDICATION LIST:  1. Mobic 7.5 daily.  2. Synthroid 75 mcg q.a.m.  3. Accolate 20 mg p.o. b.i.d.  4. Xolair 1 shot q 4 weeks with Dr. Lucie Leather.  5. Coumadin 4.5 mg Monday, Friday, Saturday, 3 mg on every other day.  6. Nexium 40 mg t.i.d.  7. Zocor 10 q 6 p.m.  8. Prednisone 20 mg p.o. daily.  9. Advair Diskus 500/50, 1 puff b.i.d.  10.      Albuterol MDI 2 puffs q.4h. p.r.n.  11.      Atrovent 2 puffs daily.  12.      ____________ Aqua 1 puff each nostril q.a.m.  13.      Tussionex p.r.n.  14.      Diovan 150 b.i.d.  15.      Lexapro 20 q.h.s.  16.      Norvasc 2.5 q.h.s.  17.      Tamoxifen 10 mg b.i.d.  18.      Ambien 10 q.h.s.  19.       Trazodone 50-100 mg p.o. q.h.s. p.r.n.  20.      Benadryl 25 mg p.o. q.h.s. p.r.n.  21.      Calcium with vitamin D 600 b.i.d.  22.      Multivitamin 1 p.o. daily.  23.      Tylenol 650 q.6h. p.r.n.  24.      Nebulizer treatments p.r.n.   REVIEW OF SYSTEMS:  Full review of systems were discussed with the patient,  and she reported everything in the HPI.  She gets her Coumadin checked at  [facility].  She is status post 30 days plus of Levaquin which  finished approximately two weeks ago for a chronic cough and felt to be  bronchitis.  She has a dry, hacking cough, pleuritic chest pain, neck and  throat stiffness, raspy voice, throat fullness, dysphagia, fevers, chills,  rib pain, generalized malaise, dehydration, generalized feeling ill.  No  bowel or bladder issues and no other organ system dysfunction per patient.   PHYSICAL EXAMINATION:  VITAL SIGNS:  Temperature 100.9, blood pressure  118/28, heart rate 97, respiratory rate 20, saturating 90% on room air.  GENERAL:  Alert and oriented x 3.  Ill-appearing, diaphoretic raspy voice,  and whisper.  EAR/NOSE/AND THROAT:  Right anterior cervical neck mass, presumed  lymphadenopathy.  It is hard.  It has only been there for less than 24  hours.  No stridor.  Oropharynx is dry, cracked lips.  PERRLA, EOMI.  PULMONARY:  Left base significant wheezing and rhonchi, greater then the  rest of her lung field.  CARDIAC:  Some irregularity with questionable 1/6 systolic ejection murmur.  ABDOMEN:  Soft.  EXTREMITIES:  No edema.  ANCILLARY DATA:  White blood cell count 28,000, hemoglobin 12.6, platelet  count 330, PT 29.6, INR 4.5.  Urinalysis negative with 3-6 white blood cells  per high powered field and many bacteria.  Blood cultures pending.  CMET was  pending, but results just back.  Sodium 135, potassium 4.0, chloride 97, bicarb 25, BUN 28, creatinine 1.7, glucose 101.  LFTs were fine.  EKG showed  sinus tachycardia 106,  noted pacemaker spikes, PVC is noted.  Chest x-ray:  Left lower lobe and lingula pneumonia.  Right lateral chest x-ray and neck x-  ray shows no airway compromise and really no soft tissue masses.   ASSESSMENT:  A 77 year old with multiple medical problems, maintained on 20  mg of prednisone for asthma and recently for greater than 30 days for cough  and bronchitis, being admitted for left lower lobe pneumonia, left lingular  pneumonia, leukocytosis, cough, pleuritic chest pain, fever, hypoxia,  diaphoresis, new onset murmur, rule out spontaneous bacterial endocarditis,  large right anterior cervical neck mass, presumed reactive lymph node, and  dehydration.   PLAN:  1. Admit.  2. Telemetry.  3. Rule out myocardial infarction.  4. Antibiotics for pneumonia.  5. Nebulizer treatments, oxygen, home pulmonary treatments.  6. Stress dose steroids.  7. Home medications.  8. 2 D echocardiogram to follow up murmur.  Will rule out subacute bacterial     endocarditis with multiple blood cultures.  9. Right anterior cervical lymphadenopathy, no airway compromise.  Will get     Ear, Nose, and Throat involved if needed.  10.      Coumadin per protocol.  Keep INR between 2-3 for her history of     atrial fibrillation.  11.      Cough suppressant.  12.      Vicodin for pain.  13.      Will hydrate, especially with creatinine 1.7.                                                Gwen Pounds, M.D.    JMR/MEDQ  D:  05/20/2003  T:  05/20/2003  Job:  [id number]   cc:   Larina Earthly, M.D.  [address]  [place]  [place] [address]  Fax: [phone]   Lucky Cowboy, M.D.  [address] [place]  [place] [address]  Fax: [phone]   Jessica Priest, M.D.  [address]  [place] [address]  Fax: [phone]   Veneda Melter, M.D.   Oley Balm Sung Amabile, M.D. [facility]

## 2010-11-05 NOTE — Discharge Summary (Signed)
NAME:  [name], [name]                  ACCOUNT NO.:  [id number]   MEDICAL RECORD NO.:  [id number]          PATIENT TYPE:  INP   LOCATION:  6709                         FACILITY:  [facility]   PHYSICIAN:  Larina Earthly, M.D.        DATE OF BIRTH:  Nov 24, 1933   DATE OF ADMISSION:  10/21/2005  DATE OF DISCHARGE:  10/25/2005                                 DISCHARGE SUMMARY   DISCHARGE DIAGNOSES:  1.  Acute asthmatic bronchitis in the setting of hypoxia, low-grade fever      and chronic asthma that is prednisone dependent.  2.  Atrial fibrillation, rate controlled.  3.  Mood disorder.  4.  Hypothyroidism.  5.  Gastroesophageal reflux disease.  6.  Anticoagulation.  7.  History of breast cancer left breast diagnosed in 2004.  8.  Hypertension.  9.  Chronic low back pain.   DISCHARGE MEDICATIONS:  1.  Mucinex as needed.  2.  Mobic 15 mg every morning.  3.  Synthroid 75/50 mcg alternating days.  4.  Accolate 20 mg p.o. b.i.d.  5.  Coumadin per [employer].  6.  Nexium 40 mg each day.  7.  Zocor 10 mg each day.  8.  Prednisone 30 mg each day to be tapered down at my discretion to 5 mg      chronically.  9.  Diovan 320 mg each day.  Please note that the Diovan is being used 160      mg p.o. b.i.d.  10. Norvasc 2.5 mg each day.  11. Tamoxifen 10 mg p.o. b.i.d.  12. Depakote ER 500 mg p.o. q.h.s.  13. Atrovent, Foradil, Spiriva and Pulmicort inhalers.  14. Xopenex nebulizers q.i.d. and p.r.n.  15. Wellbutrin 100 mg each day.  16. Aricept 5 mg each day.  17. Boniva monthly.  18. Trazodone 100 mg each day at nighttime.  19. Lunesta 3 mg p.o. q.h.s. p.r.n. for chronic sleeping.   The patient does have a history of using Flexeril 10 mg p.o. q.h.s. p.r.n.  for muscle spasms at nighttime.  The patient will also use Avelox 400 mg  each day for 10 days and __________  300 mg p.o. q.p.m.   DISCHARGE LABORATORIES:  White blood cell count 10.0, hemoglobin 9.9,  hematocrit 28.6%, platelet count  255.  Sodium 139, potassium 4.2, BUN 17,  creatinine 1.0, serum CO2 25, glucose 85.  PT/INR 2.9.  Other pertinent  laboratories:  Admission white blood cell count was 16,000, admission  hemoglobin 11.8, PT/INR on admission was 1.8, admission sodium was 130, BUN  21, creatinine 1.2, potassium 4.6, albumin 3.0, liver function tests normal.  Chest x-ray revealed stable bibasilar infiltrates and/or atelectasis.  On  Oct 23, 2005, EKG revealed normal sinus rhythm with left axis deviation and  atrial sensing and ventricular pacing.   HISTORY OF PRESENT ILLNESS:  This is a 77 year old Caucasian female with a  long history of prednisone-dependent chronic asthma who was on multiple  inhalers, low-dose prednisone, mucolytic agents, Accolate, proton pump  inhibitors who presents with a 1-week history of worsening dyspnea  on  exertion with increased work of breathing and was seen by Dr. Lucie Leather on the  day of admission.  He deemed her inappropriate for outpatient therapy and  she was subsequently sent to the emergency room for further evaluation and  management and subsequent admission.  Workup in the emergency room did  reveal a questionable left lower lobe pneumonia versus atelectasis and given  hypoxia and increased work of breathing the patient was subsequently  admitted by my partner, Dr. Guerry Bruin.   HOSPITAL COURSE:  The patient was given aggressive pulmonary toilet with  Atrovent and Xopenex nebulizers, empiric antibiotic use with Avelox and  Advair and she was continued on pharmacy protocol for Coumadin for  anticoagulation.  During the initial 2 days of therapy despite a higher dose  of prednisone the patient continued to have significant work of breathing,  hypoxia, productive cough and inability to ambulate without oxygen or  significant fatigue.  Her heart rate continued to be well-controlled on her  current medications and she never had significant issues with hemodynamic   compromise.  She remained on peptic ulcer disease prophylaxis.  Her mood  disorder was stable and she remained on supplementation for her  hypothyroidism.  She was monitored closely by pharmacy given her use of  antibiotics and Coumadin.  On Oct 24, 2005 she appeared to be making some  progress with decreased work of breathing yet she was still oxygen dependent  and by the morning of Oct 25, 2005 she was feeling much better, able to move  more air, with decreased shortness of breath upon walking, she was  tolerating 100% of her diet with no nausea or vomiting, chest pain,  shortness of breath or dizziness.  She continued to have a slight low-grade  fever of 99.5 degrees Fahrenheit.  Staff walked the patient twice on the day  of discharge and she was found to have an oxygen saturation of 92-94% on  room air which is quite different from the previous day.  She was anxious  for discharge and she was felt appropriate for discharge on Oct 25, 2005 in  the presence of her husband.  It was adamantly discussed that she continue  to use Xopenex nebulizer treatments with low threshold for additional  treatments in addition to the q.i.d. prescribed therapy at this time.  She  was to return to my clinic on Oct 28, 2005 for an oxygen check as well as  overall evaluation of her chronic asthma at this time.  She was also  instructed to call if she has increased work of breathing, high fevers or  worsening expiratory wheezing.  We will have a low threshold for ordering  home health to come out for home oxygen.  She is also to get her PT/INR  checked at [facility] on Oct 26, 2005 and to continue her Avelox for  approximately 10 days.  She was also given a note to hopefully excuse she  and her husband from planned travel this coming weekend given her tenuous  state.      Larina Earthly, M.D.  Electronically Signed     RA/MEDQ  D:  10/25/2005  T:  10/26/2005  Job:  [id number]  cc:   Jessica Priest,  M.D.  Fax: [phone]

## 2010-11-05 NOTE — Cardiovascular Report (Signed)
NAME:  [name], [name] NO.:  [id number]   MEDICAL RECORD NO.:  [id number]                   PATIENT TYPE:   LOCATION:                                       FACILITY:   PHYSICIAN:  Veneda Melter, M.D. [facility]               DATE OF BIRTH:   DATE OF PROCEDURE:  02/21/2002  DATE OF DISCHARGE:                              CARDIAC CATHETERIZATION   PROCEDURES PERFORMED:  1. Left heart catheterization.  2. Left ventriculogram.  3. Left coronary angiography.  4. Perclose right femoral artery.   DIAGNOSES:  1. Mild coronary artery disease by angiogram.  2. Normal  left ventricular systolic function.   HISTORY:  The patient is a 77 year old white female  with a history of  tobacco use and pulmonary disease, who presents with substernal chest  discomfort.  The patient was admitted to the hospital and stabilized  medically. The patient had undergone a stress test earlier this year which  did not suggest ischemia.  Due to persistent symptoms she was referred for  cardiac catheterization.   TECHNIQUE:  Informed consent was obtained and the patient was brought  to  the catheterization laboratory. A 6 French sheath was placed in the right  femoral artery using the modified Seldinger technique.  A 6 [place] JL-4 and  JR-4 catheter was then used to engage the left and right coronary arteries  and selective angiography was performed in various projections using manual  injection of contrast. A 6 French pigtail catheter was then advanced to the  left ventricle and a left ventriculogram was performed using power injected  contrast. At the termination of the procedure,  the catheters and the  sheaths were removed and Perclose suture closure device was deployed to the  right femoral artery until adequate hemostasis was achieved. The patient  tolerated the procedure well and was transferred to the floor in stable  condition.   FINDINGS:  1. The left main trunk was a  medium caliber vessel, angiographically     normal.  2. The LAD is a large caliber vessel that gives rise to two diagonal     branches in the mid section.  The LAD has mild irregularities of  20% to     30% in the proximal midsection.  3. The left circumflex artery is a medium caliber vessel that gives rise to     two marginal branches in the midsection. The left circumflex vessel also     has mild irregularities.  4. The ramus intermedius is a small caliber vessel with mild irregularities.  5. The right coronary is a dominant, medium caliber vessel that gives rise     to the posterior descending artery and the posterior ventricular branch.     The terminal segment of the  right coronary had luminal irregularities.  6. Left ventricle. Normal end systolic and end diastolic dimensions overall  left ventricular function is well preserved.  Ejection fraction greater     than 65%, no mitral regurgitation. The LV pressure is 110/0, the aortic     is 110/55, the LVEDP is 13.    ASSESSMENT AND PLAN:  The patient is a 77 year old female with mild coronary  artery disease by angiogram, well preserved left ventricular function. She  will be treated medically, risk factor modification proceed and have the  causes of chest pain investigated.                                                Veneda Melter, M.D. [facility]    NG/MEDQ  D:  02/21/2002  T:  02/23/2002  Job:  [id number]   cc:   Doylene Canning. Ladona Ridgel, M.D. [facility]   Ravi R. Felipa Eth, M.D.

## 2010-11-05 NOTE — Op Note (Signed)
NAME:  [name], [name]                            ACCOUNT NO.:  [id number]   MEDICAL RECORD NO.:  [id number]                   PATIENT TYPE:  SPE   LOCATION:  DFTL                                 FACILITY:  [facility]   PHYSICIAN:  Rose Phi. Maple Hudson, M.D.                DATE OF BIRTH:  [birth year]   DATE OF PROCEDURE:  07/17/2002  DATE OF DISCHARGE:  07/05/2002                                 OPERATIVE REPORT   PREOPERATIVE DIAGNOSIS:  Stage 1 carcinoma of the left breast.   POSTOPERATIVE DIAGNOSIS:  Stage 1 carcinoma of the left breast.   OPERATION PERFORMED:  1. Blue dye injection.  2. Left partial mastectomy with needle localization and specimen     mammography.  3. Left sentinel lymph node biopsy.   SURGEON:  Rose Phi. Maple Hudson, M.D.   ANESTHESIA:  General.   INDICATIONS FOR PROCEDURE:  This 77 year old married female had presented  with an abnormal left breast mammogram.  Core biopsy had shown a small  invasive cancer which is now scheduled for excision.   DESCRIPTION OF PROCEDURE:  Prior to coming to the operating room she  underwent a wire localization of the lesion as well as an injection of 1 mCi  of technetium sulfur colloid injected intradermally in the periareolar  tissue.  After suitable anesthesia was induced, the patient was placed in  the supine position with the left arm extended on the arm board.  4 cc of  Lymphazurin blue was injected in the subareolar tissues and the breast  gently massaged.   We then prepped and draped the breast and axilla.  A curved incision in the  upper inner quadrant of her breast was then made and, using the wires as a  guide, a wide excision was carried out.  Specimen was oriented for the  pathologist.  Specimen mammography confirmed the removal of the lesions and  the tissue was submitted to the pathologist for Touch Preps which turned out  to be negative.  While that was being done, we scanned the axilla, the  supraclavicular area and the  internal mammary chain and I got no counts  anywhere.  I then made a short transverse incision in the left axilla and  dissected through the subcutaneous tissue and exposed the clavipectoral  fascia.  Again, I could get no radioactive counts but I was able to trace a  small blue lymphatic into two nodes which were removed. These were labeled  sentinel nodes and were submitted to the pathologist for Touch Preps.  There  were no other palpable blue or hot items in the axilla.  The Touch Prep on  the nodes was  negative.   The incisions were then closed with 3-0 Vicryl and subcuticular 4-0 Monocryl  and Steri-Strips.  Dressings were applied.  The patient was then transferred  to the recovery room in satisfactory condition having tolerated  the  procedure well.                                                Rose Phi. Maple Hudson, M.D.    PRY/MEDQ  D:  07/17/2002  T:  07/17/2002  Job:  [id number]   cc:   Larina Earthly, M.D.  [address]  [place]  [place] [address]  Fax: [phone]

## 2010-11-05 NOTE — Discharge Summary (Signed)
NAME:  [name], [name]                            ACCOUNT NO.:  [id number]   MEDICAL RECORD NO.:  [id number]                   PATIENT TYPE:  INP   LOCATION:  3706                                 FACILITY:  [facility]   PHYSICIAN:  Veneda Melter, M.D. [facility]               DATE OF BIRTH:  1933/09/03   DATE OF ADMISSION:  02/19/2002  DATE OF DISCHARGE:  02/21/2002                           DISCHARGE SUMMARY - REFERRING   PROCEDURES:  1. Cardiac catheterization.  2. Coronary arteriogram.  3. Left ventriculogram.   HOSPITAL COURSE:  The patient is a 77 year old female with no history of  coronary artery disease, although she has had atrial flutter with high-  degree A-V block requiring pacemaker placement in the past.  She is also on  chronic Coumadin therapy for paroxysmal atrial fibrillation.  She was  evaluated at [facility] on 02/19/2002 for chest pain that had  started at 8:30 on the day before admission.  It was 10 out of 10 in  intensity, and she had some nausea with this.  It persisted for four hours  at which time she took nitroglycerin, and it went away.  She was evaluated  in the emergency room and transferred to [facility] for further  evaluation and treatment.   Her enzymes were negative for MI, and she was scheduled for a cardiac  catheterization.  The cardiac catheterization showed a normal left main, LAD  of less than 20% stenosis, and proximal portion of 20 to 30% stenosis in the  mid portion.  The ramus intermedius had mild disease, and circumflex and RCA  systems had no significant stenosis.  Her EF was greater than 55% with no  MR.  It was felt that her symptoms were not secondary to coronary artery  disease and she underwent Perclose.   The patient had a productive cough, and she was evaluated for this.  Because  of her history of asthmatic bronchitis, a pulmonary consult was called.   She was seen by Dr. Sung Amabile of the pulmonary team, and he felt that she  had  no acute bronchospasms so that her respiratory status as far as her asthma  was concerned was at baseline.  He felt that she did have purulent  bronchitis and, because of this, prescribed Tequin 400 mg q.d. x 7 days.  Humibid LA at home and has taken this in the past, and she to start this at  well.  She also has a history of right middle lobe syndrome, and Dr. Sung Amabile  felt that CT findings were probably chronic.  He doubted that she needed  bronchoscopy which she had in the past.   Medications changes were made as prescribed, and the patient's symptoms  improved on antibiotics.  She was otherwise continued on her home medication  regimen as far as pulmonary status went.  Coumadin was held secondary to the  cardiac catheterization. She was given vitamin K to reverse it.  She is to  restart Coumadin at her home dose and check a pro time when she sees Dr.  Felipa Eth.   Since the cardiac catheterization showed no significant coronary artery  disease and her left ventriculogram showed no wall motion abnormalities, she  was considered stable for discharge on 02/21/2002 pending completion of bed  rest and ambulation without any problems at her catheterization site.   LABORATORY DATA:  Hemoglobin 11.0, hematocrit 31.8, WBC 11.0, platelets 170.  Sodium 142, potassium 3.8, chloride 112, CO2 23, BUN 15, creatinine 0.9,  glucose 81.  INR day of catheterization 1.6.  TSH 1.374.  Total cholesterol  156, triglycerides 164, HDL 64, LDL 59.   CT of the chest: Right middle lobe atelectasis which could be due to a  centrally obstructing endobronchial mass or mucus plug.  Healed left  anterior sixth rib fracture producing 1.5 cm nodular density on portable  chest radiographs done earlier today.  Multiple old healed right rib  fractures.  Mild bilateral lower lobe atelectasis.  Evidence of previous  granulomatous infection; 2.4 cm upper pole left renal cyst; 8 mm right mid  renal angiomyolipoma.  Small  calcified left hilar lymph nodes are noted.  There are costovertebral degenerative changes at multiple levels.   CONDITION ON DISCHARGE:  Stable.   CONSULTATIONS:  Oley Balm. Sung Amabile, M.D.   DISCHARGE DIAGNOSES:  1. Chest pain, no critical coronary artery disease by catheterization this     admission with preserved left ventricular function.  2. History of asthmatic bronchitis.  3. History of right middle lobe syndrome.  4. Hyperlipidemia.  5. Hypertension.  6. History of atrial flutter with heart block, status post permanent     pacemaker.  7. Chronic anticoagulation.  8. Paroxysmal atrial fibrillation.  9. Gastroesophageal reflux disease.  10.      Chronic back pain.  11.      Hypothyroidism.  12.      Abnormal chest x-ray, pulmonary to manage.  13.      History of allergy to SULFA and ASPIRIN.  14.      Status post umbilical hernia repair.  15.      Recent pneumonia.  16.      Osteoarthritis and degenerative disk disease.  17.      Pansinusitis.  18.      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  19.      History of endometriosis.   ACTIVITY:  The patient is to do no driving or strenuous activity for two  days.,   DIET:  Low salt and low fat.   WOUND CARE:  He is to call the office for problems with the catheterization  site.   FOLLOW UP:  She is to get a pro time on her next office visit.  She is to  see Dr. Felipa Eth within two weeks.  She is to see Dr. Sung Amabile or Dr. Jayme Cloud as  well.  She is to follow up with Dr. Ladona Ridgel and call for an appointment.  She  is to follow up with Dr. Lucie Leather as scheduled.   DISCHARGE MEDICATIONS:  1. Vioxx 25 mg q.d.  2. Synthroid 75 mcg q.d.  3. Zantac 150 mg b.i.d.  4. Accolate 20 mg q.d.  5. Allegra b.i.d.  6. Nexium 40 mg b.i.d.  7. Zocor 20 mg q.d.  8. Diovan 160 mg b.i.d.  9. Norvasc 3.5 mg q.h.s.  10.  Prednisone 5 mg q.d.  11.      Premarin 0.625 mg q.d. 12.      Advair Diskus 50/500 b.i.d.  13.      Albuterol and Atrovent  inhalers as prior to admission.  14.      Tequin 400 mg q.d. for 7 days.  15.      Coumadin as prior to admission.     Lavella Hammock, P.A. [facility]                  Veneda Melter, M.D. [facility]    RG/MEDQ  D:  02/21/2002  T:  02/22/2002  Job:  [id number]   cc:   Doylene Canning. Ladona Ridgel, M.D. [facility]   Jessica Priest, M.D.  [address]  [place] [address]  Fax: [phone]   Oley Balm. Sung Amabile, M.D. [facility]   Ravi R. Felipa Eth, M.D.

## 2010-11-05 NOTE — H&P (Signed)
NAME:  [name], [name]                            ACCOUNT NO.:  [id number]   MEDICAL RECORD NO.:  [id number]                   PATIENT TYPE:  INP   LOCATION:  3706                                 FACILITY:  [facility]   PHYSICIAN:  Rollene Rotunda, M.D. [facility]            DATE OF BIRTH:  06-13-1934   DATE OF ADMISSION:  02/19/2002  DATE OF DISCHARGE:                                HISTORY & PHYSICAL   REASON FOR PRESENTATION:  Evaluate the patient with chest pain.   HISTORY OF PRESENT ILLNESS:  The patient is a 77 year old white female whose  cardiac history includes atrial flutter with apparent high-degree block  requiring a pacemaker placement in the past, paroxysmal atrial  flutter/fibrillation on chronic Coumadin therapy.  She has also had chest  discomfort and did have a stress test (I believe it was a dobutamine  echocardiogram earlier this year although I do not have the records) that  was apparently negative for any evidence of ischemia.  The patient now  presents with an episode of chest discomfort at 8:30 last night.  This is  similar to pain she has had in the past which may have been reflux.  However, this was much more intense than other episodes.  There was some  radiation up into her neck.  It was 10/10 in intensity.  She was somewhat  nauseated but did not get diaphoretic.  She did not described shortness of  breath.  It was somewhat sharp.  It persisted for 4 hours until she finally  took a nitroglycerin at which point it went away.  She did have a residual  right upper shoulder discomfort that persisted until she got to the  emergency room today.  She was again given a nitroglycerin and this  discomfort went away completely.  She was told to come to our emergency room  by our office.  The patient is currently pain free.  She has recently had a  bout of asthma and is tapering down on steroids and is currently back to her  maintenance dose.  She has been coughing up some white  phlegm which she  often does when she is recovering from asthma.  She has not been otherwise  describing fevers or chills.  She has had no significant nausea or vomiting.  She was in [place] last week and quite active and was not bringing on any  symptoms with the walking that she was doing there.  She has been seen in  our Coumadin clinic and has maintained therapeutic Coumadin levels (in fact  they have been a little bit high).   PAST MEDICAL HISTORY:  Hypertension, hyperlipidemia, asthma, recurrent  pneumonia, osteoarthritis, degenerative disk disease, hypothyroidism, atrial  fibrillation/atrial flutter, permanent pacemaker placement for high-degree  heart block with right atrial arrhythmias, pansinusitis, radiotherapy of her  face, endometriosis.   PAST SURGICAL HISTORY:  Umbilical hernia repair.  ALLERGIES:  SULFA and ASPIRIN.   MEDICATIONS:  Coumadin, Vioxx 25 mg q.d., Synthroid 0.75 mg q.d., ranitidine  150 mg b.i.d., Accolate 20 mg b.i.d., Allegra 180 mg b.i.d., Nexium 40 mg  b.i.d., Zocor 20 mg q.h.s., prednisone 5 mg q.d. maintenance dose, Premarin  0.625 mg q.d., Advair Diskus 500/50 mg one puff q.a.m. and q.p.m., albuterol  inhaler, Atrovent inhaler, Rhinocort Aqua one puff each nostril q.a.m.,  Diovan 160 mg b.i.d., Norvasc 2.5 mg q.h.s., Ambien 10 mg q.d., trazodone 50  mg q.h.s., Fosamax 70 mg every week, calcium 600 mg b.i.d., One-A-Day  vitamin, vitamin E 400 international units q.h.s., Duragesic p.r.n.,  Flexeril p.r.n., Benadryl p.r.n., torsemide p.r.n. 10 mg, Klor-Con 10 mEq  p.r.n.   SOCIAL HISTORY:  The patient is married.  She was living in [place] but  relocated to [place] with her husband who is a retired Optometrist.  She  stopped smoking at the age of 43.  She does not drink alcohol.   FAMILY HISTORY:  Noncontributory for her early coronary artery disease.   REVIEW OF SYSTEMS:  As stated in the HPI and positive for chronic shortness  of breath,  chronic back pain, joint pains (these are currently quiescent).  Otherwise as stated in the HPI, negative for all other systems.   PHYSICAL EXAMINATION:  GENERAL: The patient is in no distress.  VITAL SIGNS: Blood pressure 120/70, heart rate 92 and regular.  HEENT: Eyes unremarkable, pupils equal, round, reactive to light, fundi not  visualized; oral mucosa unremarkable.  NECK: No jugular venous distention, waveform within normal limits, carotid  upstroke brisk and symmetric, no bruits, no thyromegaly.  LYMPHATICS: No cervical, maxillary, or inguinal adenopathy.  LUNGS: Clear to auscultation bilaterally.  BACK: No costovertebral angle tenderness.  CHEST: Unremarkable.  HEART: PMI not displaced or sustained, S1 and S2 within normal limits, no  S3, no S4, no murmurs.  ABDOMEN: Obese, positive bowel sounds, normal in frequency and pitch.  No  bruits, rebound, guarding to the midline.  No mass, no hepatomegaly, no  splenomegaly.  SKIN: No rash, no nodules.  EXTREMITIES: Two plus pulses throughout, no edema, no cyanosis, no clubbing,  no calf tenderness or swelling.  NEUROLOGICAL: Oriented to person, place and time, cranial nerves II-XII  grossly intact, motor grossly intact.   DIAGNOSTIC STUDIES:  EKG - sinus rhythm with probable blocked PACs,  ventricular demand pacemaker with 100% capture.   Chest x-ray - 6 cm mass with rounded area of pneumonia or atelectasis in the  right lower lung zone.   LABORATORIES:  WBC 15.2, hemoglobin 12.0, sodium 135, potassium 3.8, glucose  91, BUN 17, creatinine 0.9, INR 3.0, CK 54, MB 1.8, troponin 0.01, platelets  191.   ASSESSMENT AND PLAN:  1. Chest discomfort.  The patient's chest discomfort has some typical     features for angina.  It did go away with nitroglycerin.  She has     cardiovascular risk factors.  Therefore, given this (despite the stress    test that was negative earlier this year) I think it is prudent to go     ahead with cardiac  catheterization to definitively rule out obstructive     coronary disease.  I have discussed this at length with the patient and     her family.  They understand the risks and benefits and agree to proceed.     Of course this will depend other evaluation ongoing as below.  2. Abnormal chest x-ray.  It is  recommended that the patient have a CT.  She     has had a long history of lung problems with recurrent pneumonia and may     have chronic changes secondary to this.  However, we will try to rule out     any new mass with this study.  3. Asthma.  The patient will continue on her current medications including     her maintenance dose of prednisone 5 mg.  4. Hyperlipidemia.  The patient will continue on Zocor and we will check a     lipid profile.  5. Hypertension.  She will continue her current medications.  6. Atrial flutter with heart block.  She is status post pacemaker.  We will     be holding the Coumadin.  I will start heparin when her INR is less than     2.  I will give her vitamin K to try to reverse her Coumadin (low dose 2     mg p.o. x1).  7. Reflux.  The patient will continue on her p.o. medication.  8.     Chronic back pain.  She will continue with her medications as listed     including her p.r.n. pain medicines.  9. Hypothyroidism.  We will check a TSH and continue her Synthroid.  10.      SULFA/ASPIRIN allergy.                                               Rollene Rotunda, M.D. [facility]    JH/MEDQ  D:  02/19/2002  T:  02/19/2002  Job:  [id number]   cc:   Daleen Bo R. Felipa Eth, M.D.

## 2010-11-05 NOTE — Discharge Summary (Signed)
NAME:  [name], [name]                  ACCOUNT NO.:  [id number]   MEDICAL RECORD NO.:  [id number]          PATIENT TYPE:  OIB   LOCATION:  2899                         FACILITY:  [facility]   PHYSICIAN:  Maple Mirza, PA   DATE OF BIRTH:  [birth year]   DATE OF ADMISSION:  07/20/2006  DATE OF DISCHARGE:  07/20/2006                               DISCHARGE SUMMARY   ALLERGIES:  SULFA AND ASPIRIN.   PRINCIPAL DIAGNOSES:  1. Existing pacemaker which is a Medtronic kappa devices at elective      replacement indicator.  2. The patient is status post generator change of existing pacemaker      with implant of Medtronic Sensia dual-chamber pacemaker.  3. The patient had a pacemaker implanted for complete heart block.   SECONDARY DIAGNOSES:  1. Hypertension.  2. History of breast cancer.  3. 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  4. GERD.  5. Graves' disease.  6. Severe lung disease and bronchiectasis.  7. Paroxysmal atrial arrhythmias/PAF/flutter.   PROCEDURE:  July 20, 2006, explant of existing pacemaker generator  which is at elective replacement indicator and implantation of Medtronic  [place] DR dual-chamber pacemaker, Dr. Lewayne Bunting.  The patient has had  no post procedural complications.  The device has been interrogated is  functioning well.  The patient is asked to remove her bandage on the  morning of Friday, February 1, and to leave the incision open to the  air, thereafter.  She is asked not to lift anything heavier than 10  pounds for next 2 weeks.  She is to keep her incision dry for next 7  days and to sponge bathe until Thursday, February 7.   BRIEF HISTORY:  Ms. Croghan returning for a follow-up on July 14, 2006.  She has a history of complete heart block and very little ventricular  escape.  She is status post pacemaker insertion.  She has a history of  severe COPD and bronchiectasis.  On  transtelephonic monitoring, she was  found to be in the elective replacement indicator for her pacemaker.   The patient also has history of cancer.  She has been treated for breast  cancer and she also had a sarcoma of the finger which resulted in  amputation of two digits.   PHYSICAL EXAMINATION:  GENERAL APPEARANCE:  She is in no acute distress.  LUNGS:  Clear bilaterally.  No jugular venous distension.  There is no  increased work of breathing.  CARDIOVASCULAR:  Revealed regular rate and rhythm with normal S1-S2.  EXTREMITIES:  Demonstrate no cyanosis, clubbing or edema.   STUDIES:  Electrocardiogram shows sinus rhythm with ventricular pacing  dyssynchronously.  Interrogation of her pacemaker shows that she has a  Medtronic kappa 700.  It is at elective replacement indicator since  June 09, 2006.  The options have been discussed with the patient and  she presented electively for pacemaker generator replacement.   HOSPITAL COURSE:  The patient presented electively January 31.  She  underwent a generator change out without complication.  Discharged on  Keflex 500 mg one tablet before breakfast, lunch, dinner and bedtime for  5-day period.  She also continues her other medications including  1. Meloxicam 50 mg in the morning.  2. Synthroid 50 mcg daily to alternate with 75 mcg every other day.  3. Accolate 20 mg twice daily.  4. Coumadin 3 mg daily except 1 mg Friday.  5. Nexium 40 mg twice daily.  6. Reglan 10 mg before meals and at bedtime.  7. Ranitidine 3 mg daily at bedtime.  8. Bupropion 100 mg daily.  9. Prednisone 5 mg daily.  10.Atacand 60 mg daily.  11.Amlodipine 2.5 mg at bedtime.  12.tamoxifen 10 mg twice daily.  13.Aricept 5 mg daily.  14.Xopenex 2 puffs twice daily.  15.Symbicort 2 puffs twice daily.  16.Rhinocort Aqua every morning.  17.Trazodone 100 mg at bedtime.  18.Lunesta 3 mg at bedtime.  19.Simvastatin 10 mg at bedtime.   FOLLOWUP:  She will  follow-up with [facility] at the pacer  clinic Wednesday, February 13 at 10 o'clock in the morning.  Once again,  she is to keep her incision dry for the next 7 days, to sponge bathe  until Thursday, February 7.   LABORATORY DATA:  Laboratory studies pertinent to this admission were  drawn on January 25:  White cells 8.8, hemoglobin 10.8, hematocrit 31.5,  platelets of 210, pro time 18.5, INR 2.1.  Sodium was 139, potassium  4.3, chloride 170, bicarbonate 27, glucose 89, BUN is 29, creatinine  1.30.      Maple Mirza, PA     GM/MEDQ  D:  07/20/2006  T:  07/20/2006  Job:  [id number]   cc:   Doylene Canning. Ladona Ridgel, MD  Larina Earthly, M.D.

## 2010-11-05 NOTE — Assessment & Plan Note (Signed)
[facility]                         ELECTROPHYSIOLOGY OFFICE NOTE   NAME:[name], [name]                         MRN:          [id number]  DATE:08/02/2006                            DOB:          10/18/1933    [name] was seen today in the clinic on August 02, 2006, for a wound-  check of her newly-implanted Medtronic model number [id number] Sensia.  Date  of implant was July 20, 2006, for complete heart block.  On  interrogation of her device today, her battery voltage is 2.77, P-waves  measured 2.8 to 4 millivolts with an atrial lead impedance of 474 ohms  and an atrial pacing threshold of 0.75 volts to 0.4 milliseconds.  R-  waves measured greater than 15.68 millivolts with a ventricular capture  threshold of 0.75 volts at 0.4 milliseconds and a ventricular lead  impedance of 562 ohms.  She does have an escape rhythm only at  approximately 40 beats per minute.  There were two mode-switch episodes  noted, totaling less than 0.1% of the time.  Capture adaptive is on.   There were no changes in her parameters.  Her Steri-Strips were removed  today.  Her wound was without redness or edema and she will be seen  again in three months' time.      Miranda Harm, LPN  Electronically Signed      Doylene Canning. Ladona Ridgel, MD  Electronically Signed   PO/MedQ  DD: 08/02/2006  DT: 08/02/2006  Job #: [id number]

## 2010-11-05 NOTE — Discharge Summary (Signed)
NAME:  [name], [name]                            ACCOUNT NO.:  [id number]   MEDICAL RECORD NO.:  [id number]                   PATIENT TYPE:  INP   LOCATION:  2029                                 FACILITY:  [facility]   PHYSICIAN:  Larina Earthly, M.D.                     DATE OF BIRTH:  July 10, 1933   DATE OF ADMISSION:  05/20/2003  DATE OF DISCHARGE:  05/28/2003                                 DISCHARGE SUMMARY   DISCHARGE DIAGNOSES:  1. Parapharyngeal cellulitis secondary to group A Streptococcus complicated     by right suppurative lymphadenitis.  2. Left lower lobe and left lingular pneumonia.  3. Asthma, severe, with history of immotile cilia syndrome.  4. Seasonal allergic rhinitis.  5. Atrial fibrillation and flutter, on chronic anticoagulation with     Coumadin, managed by [facility].  6. Pacemaker.  7. Flow murmur with unremarkable echocardiogram.  8. Thrush.  9. Dehydration, resolved with intravenous fluids.  10.      Mild edema complicated by prednisone use.  11.      Hypothyroidism with dose adjustment during hospitalization.  12.      Mildly elevated cardiac enzymes with normal troponin I.   SECONDARY DIAGNOSES:  1. History of early stage I breast cancer, status post lumpectomy, on     tamoxifen treatment.  2. Cardiac catheterization, September 2003, with nonobstructive coronary     artery disease with normal left ventricular function per Dr. Veneda Melter.  3. History of asthmatic bronchitis and multiple pneumonias complicated by     bronchiectasis.  4. Hyperlipidemia.  5. Hypertension.  6. Gastroesophageal reflux disease.  7. Low back pain.  8. History of repaired umbilical hernia.  9. Osteoarthritis with degenerative disk disease.  10.      History of pansinusitis.  11.      History of endometriosis.  12.      History of spasmodic dystonia, status post Botox treatment.  13.      History of multiple broken ribs, status post coughing episodes.   ALLERGIES:  SULFA  and ASPIRIN.   DISCHARGE MEDICATIONS:  1. Mobic 7.5 mg p.o. daily.  2. Synthroid 50 mcg alternating with 75 mcg p.o. daily.  3. Accolate 20 mg p.o. b.i.d.  4. Xolair one shot q.4 wk. with Dr. Jessica Priest.  5. Coumadin 4.5 mg on Monday, Friday and Saturday and 3 mg every other day.  6. Nexium 40 mg p.o. t.i.d.  7. Zocor 10 mg p.o. every 6 p.m.  8. Prednisone taper 60 mg p.o. daily, currently now to be tapered down to 50     mg p.o. daily x5 days and then 40 mg p.o. daily x5 days and then 30 mg     p.o. daily x5 days and then reduced down to 20 mg p.o. daily with further     reductions pending outpatient management.  9. Advair Diskus 500/50 mcg one puff b.i.d.  10.      Albuterol metered-dose inhaler two puffs q.4 h. p.r.n.  11.      Atrovent metered-dose inhaler two puffs daily.  12.      Albuterol and Atrovent nebulizers q.6 h. p.r.n. pending pulmonary     outcomes.  13.      Rhinocort Aqua nasal spray one puff each nostrils q.a.m.  14.      Tussionex p.r.n.  15.      Diovan 160 mg p.o. b.i.d.  16.      Lexapro 20 mg p.o. nightly.  17.      Norvasc 2.5 mg p.o. nightly.  18.      Tamoxifen 10 mg p.o. b.i.d.  19.      Ambien 10 mg p.o. nightly.  20.      Trazodone 50 to 100 mg p.o. nightly p.r.n.  21.      Benadryl 25 mg p.o. nightly p.r.n.  22.      Calcium with vitamin D 600 mg p.o. b.i.d.  23.      Multivitamin one p.o. daily.  24.      Tylenol 650 mg q.6 h. p.r.n.  25.      Diflucan 100 mg p.o. daily until May 30, 2003.  26.      Lasix 20 mg p.o. daily until prednisone is tapered down to 20 mg     p.o. daily.  27.      The patient is also to take Humibid DM b.i.d. x2 weeks.   DISCHARGE INSTRUCTIONS:  Instructions to patient to include using a  humidifier in her room and if respiratory distress and/or mucus plugging  occur, the patient is to call Dr. Lucky Cowboy for further recommendations on  humidified oxygen therapy.   FOLLOWUP:  1. Follow up with Dr. Daleen Bo Avva in  approximately two weeks, at that time     will need a chest x-ray, CMET and CBC.  2. The patient is to follow up with Dr. Gerilyn Pilgrim next Tuesday, given her     episodes of airway obstruction and distress possibly secondary to mucus     occlusion of the subglottis by description.   LABORATORY EVALUATION ON DISCHARGE:  White blood cell count 12.4, hemoglobin  8.9, hematocrit 26.8%, platelet count 408,000.  Sodium 138, potassium 4.2,  BUN 25, creatinine 0.9, glucose 88, calcium 8.2.   OTHER PERTINENT LABORATORIES:  Blood cultures x2 on May 21, 2003 reveals  no growth after five days.  Blood culture via PICC line on May 26, 2003  revealed no growth to date.  PT/INR on May 27, 2003 is 4.3.  White blood  cell count on admission was 28,000 with a hemoglobin of 12.6, hematocrit  38%, platelet count 330,000 with subsequent slow dwindle in hemoglobin until  reaching 8.9 prior to discharge with intravenous fluids used for prerenal  azotemia.  White blood cell count had steady slow decline.  Of note on  admission, there were 97% neutrophils on the automatic differential.  On  admission, sodium was 135, potassium 4.0, BUN 28, creatinine 1.7 to 1.8.  Albumin was 2.4, total protein 6.7, AST 29, ALT 36, alkaline phosphatase  102, total bilirubin 0.1.  On May 23, 2003, AST was 45, ALT was 51,  alkaline phosphatase was 74, total bilirubin 0.2.  Three sets of cardiac  enzymes revealed CKs ranging from 219 to 309, CK-MB ranging from 4.7 to 6.2,  but troponin I's  were all unremarkable, ranging from 0.1 to 0.04.  TSH was  0.031 on May 21, 2003.  Urinalysis was positive on admission.  Blood  cultures on May 20, 2003 grew out group A Strep pyogenes.  Urine  culture grew out 7000 colonies of insignificant growth.   Portable chest x-ray on May 24, 2003 revealed left upper lobe pneumonia. Repeat chest x-ray on May 26, 2003 revealed increased inflammatory  reaction on the left side  with small pleural effusion and little change on  the right.   X-ray of the neck for evaluation of soft tissue reveals unremarkable soft  tissue of the neck, moderate-to-severe degenerative disk disease and  spondylosis throughout the cervical spine.   Echocardiogram on May 21, 2003 revealed inadequate study for evaluation  of the left ventricular regional wall motion, however, the left atrium was  dilated.  Again, given the limited study, the left ventricular function  looked good.  RV was poorly seen.   EKG revealed sinus tachycardia.   PROCEDURES:  The patient did have a peripherally inserted central catheter  line placed in the right arm under ultrasound guidance.   HISTORY OF PRESENT ILLNESS:  This is a 77 year old Caucasian female with  multiple medical problems well-outlined above, who was seen by Dr. Lucie Leather on  the day of admission looking quite ill with a low-grade fever and what was  thought to be a new heart murmur.  Her symptoms consisted of a sore throat,  dry mouth, shortness of breath, mild hypoxia and the patient was  subsequently sent over to the emergency room for further evaluation and  management.  In the emergency room, this patient was diagnosed with a left  lower lobe and lingular pneumonia.  Her immediate past history is  significant for multiple weeks of using prednisone and outpatient  fluoroquinolone therapy.  Given this history and the ill-appearing nature  and hypoxia of this patient, the patient was admitted and blood cultures  were obtained and the patient was administered Rocephin.  At the time, she  was having significant coughing/pleuritic chest pain.   HOSPITAL COURSE:  The patient was continued on Rocephin and azithromycin  with plans to follow up on blood cultures, especially in light of the  multiple antibiotics and prednisone that the patient had been treated with  in the previous weeks.  Clinically, the patient appeared improved.   Intravenous fluids were increased secondary to dehydration and the patient  was maintained on nebulizer treatments and steroids for her asthma.  From a  hemodynamic point of view, the patient remained stable.  Dr. Jesse Sans. Wall  for [facility] consulted on the patient and felt that her murmur was  an innocent flow murmur and deferred on obtaining a TEE, given the patient's  multiple comorbidities.  Transthoracic echocardiogram did not reveal any  definite vegetation.  Dr. Lucky Cowboy of otolaryngology consulted on the  patient, given her close followup of this patient on an outpatient basis,  and thought that she had a suppurative lymphadenitis of the right neck,  given a vastly enlarged mass that was nontender and close to obstructing the  upper airways, however, overnight with treatment with antibiotics, the size  of the mass decreased remarkably to a size of 2 to 3 cm and it was thought that this mass also probably seeded a possible pneumonia.  The patient  continued to improve within the next 24 to 48 hours from a clinical  perspective with reduced size of her right  neck mass and reduced white blood  cell counts.  Given the issue of a possible new murmur and the development  of gram-positive cocci in her initial two blood cultures, an ID consult was  obtained.  They agreed with Dr. Larae Grooms assessment that this probably  started off as a severe pharyngeal infection that led to suppurative  lymphadenitis, then bacteriemia and pneumonia.  Based on the blood cultures,  they agreed with continuing the Rocephin.  Dr. Blair Dolphin concern was with  the fact that the patient does have a pacemaker and the poor quality of the  2-D echocardiogram did not show any evidence of endocarditis.  He could not  rule out a pacemaker lead infection.  Given the patient's multiple co-  morbidities, TEE was deferred by both cardiology and Dr. Orvan Falconer, with  plans to give the patient six weeks' total  therapy of IV Rocephin on an  inpatient and then subsequently on an outpatient basis; this was set up with  home health.  During the next three to four days, the patient continued to  have some transient fevers with transient elevations in white blood cell  count, but clinically appeared to improve on a consistent basis and she was  deemed appropriate for discharge on May 28, 2003.  Other issues during  hospitalization include adjustment of thyroid medication with the need for  recheck TFTs in approximately six to eight weeks.  From a cardiovascular  perspective, the patient remained hemodynamically stable and was maintained  on anticoagulation with Coumadin.  Given her history of atrial fibrillation  and atrial flutter, the patient was treated with Diflucan for thrush on  multiple antibiotics.  The patient's caloric intake remained good throughout  her hospitalization and she was deemed appropriate for discharge on May 28, 2003 with close followup on an outpatient setting.                                                Larina Earthly, M.D.    RA/MEDQ  D:  05/28/2003  T:  05/29/2003  Job:  [id number]   cc:   Lucky Cowboy, M.D.  [address] [place]  [place] [address]  Fax: [phone]   Cliffton Asters, M.D.  [address] [place]  [place] [address]  Fax: [phone]   Oley Balm. Sung Amabile, M.D. [facility]   Jessica Priest, M.D.  [address]  [place] [address]  Fax: [phone]   Rollene Rotunda, M.D.   Doylene Canning. Ladona Ridgel, M.D.

## 2010-11-05 NOTE — Op Note (Signed)
NAME:  [name], [name]                  ACCOUNT NO.:  [id number]   MEDICAL RECORD NO.:  [id number]          PATIENT TYPE:  OIB   LOCATION:  2899                         FACILITY:  [facility]   PHYSICIAN:  Doylene Canning. Ladona Ridgel, MD    DATE OF BIRTH:  04-02-1934   DATE OF PROCEDURE:  07/20/2006  DATE OF DISCHARGE:                               OPERATIVE REPORT   PROCEDURE PERFORMED:  Removal of previously implanted dual-chamber  pacemaker, insertion of a new dual-chamber pacemaker with pacemaker  pocket revision.   INDICATION:  Complete heart block status post pacemaker insertion now  with her present device (Medtronic Kappa 700) at the ERI.   INTRODUCTION:  The patient is a 77 year old woman with a history of  complete heart block, who is status post pacemaker insertion many years  ago.  This was performed in March 2001.  The patient has reached ERI and  is now referred for pacemaker generator change.   PROCEDURE PERFORMED:  After informed consent was obtained, the patient  is taken diagnostic EP lab in fasting state.  After usual preparation  and draping, intravenous fentanyl and Midazolam was given for sedation.  30 mL of lidocaine was infiltrated over the old pacemaker insertion  site.  5 cm incision was carried over this region.  Electrocautery  utilized to dissect down to the fascial plane.  The pacemaker pocket was  entered without difficulty and removed with gentle traction.  The old  generator was disconnected from the atrial and ventricular pacing leads.  The patient had an escape rhythm of 30 beats per minute.  She had  complete heart block underneath.  The new Medtronic [place] SEDRO1 dual-  chamber pacemaker serial number [id number] was connected to the atrial  and ventricular pacing leads and placed in the subcutaneous pocket.  Generator was secured with silk suture.  Kanamycin irrigation utilized  to irrigate the pocket and incision was then closed with a layer of 2-0  Vicryl  followed by layer of 3-0 Vicryl followed by layer of 4-0 Vicryl.  It should be noted that the P and R waves were 2 and 14 respectively and  the threshold 0.5 at 0.5 in the atrium and less than 1at 0.5 in the  ventricle with impedance of 390 in the atrium at 595 in the ventricle.  Benzoin and Steri-Strips placed on the incision.  The patient was  returned to her room in satisfactory condition.   COMPLICATIONS:  There were no immediate procedure complications.   RESULTS:  This demonstrates successful removal of her previous implanted  dual-chamber pacemaker and insertion of new dual-chamber pacemaker with  relocation of the generator in a different site under the pocket.      Doylene Canning. Ladona Ridgel, MD  Electronically Signed     GWT/MEDQ  D:  07/20/2006  T:  07/20/2006  Job:  [id number]   cc:   Larina Earthly, M.D.

## 2010-11-18 ENCOUNTER — Encounter: Payer: Medicare Other | Admitting: *Deleted

## 2010-11-19 ENCOUNTER — Telehealth: Payer: Self-pay | Admitting: Internal Medicine

## 2010-11-19 ENCOUNTER — Telehealth: Payer: Self-pay | Admitting: Emergency Medicine

## 2010-11-19 DIAGNOSIS — K5732 Diverticulitis of large intestine without perforation or abscess without bleeding: Secondary | ICD-10-CM

## 2010-11-19 HISTORY — DX: Diverticulitis of large intestine without perforation or abscess without bleeding: K57.32

## 2010-11-19 MED ORDER — LORATADINE 10 MG PO TABS: 10.0000 mg | ORAL_TABLET | Freq: Every day | ORAL | Status: DC

## 2010-11-19 NOTE — Telephone Encounter (Signed)
Spoke with pt and advised that we refilled her loratidine and should keep appt pending for 12/09/10 for refills. Pt verbalized understanding.

## 2010-11-19 NOTE — Telephone Encounter (Signed)
Patient was seen by Dr Felipa Eth and he recommended she be seen here for continued LLQ pain and diarrhea despite antibiotics he started her on.  She is scheduled to see Willette Cluster RNP 11/22/10 2:30 Monday.

## 2010-11-20 ENCOUNTER — Encounter: Payer: Self-pay | Admitting: *Deleted

## 2010-11-22 ENCOUNTER — Other Ambulatory Visit (INDEPENDENT_AMBULATORY_CARE_PROVIDER_SITE_OTHER): Payer: Medicare Other

## 2010-11-22 ENCOUNTER — Encounter: Payer: Self-pay | Admitting: Nurse Practitioner

## 2010-11-22 ENCOUNTER — Ambulatory Visit (INDEPENDENT_AMBULATORY_CARE_PROVIDER_SITE_OTHER): Payer: Medicare Other | Admitting: Nurse Practitioner

## 2010-11-22 VITALS — BP 128/86 | HR 76 | Ht 65.0 in | Wt 175.0 lb

## 2010-11-22 DIAGNOSIS — R143 Flatulence: Secondary | ICD-10-CM

## 2010-11-22 DIAGNOSIS — R141 Gas pain: Secondary | ICD-10-CM

## 2010-11-22 DIAGNOSIS — R140 Abdominal distension (gaseous): Secondary | ICD-10-CM

## 2010-11-22 DIAGNOSIS — R1032 Left lower quadrant pain: Secondary | ICD-10-CM

## 2010-11-22 DIAGNOSIS — R197 Diarrhea, unspecified: Secondary | ICD-10-CM

## 2010-11-22 LAB — URINALYSIS
Hgb urine dipstick: NEGATIVE
Ketones, ur: NEGATIVE
Urine Glucose: NEGATIVE
Urobilinogen, UA: 0.2 (ref 0.0–1.0)

## 2010-11-22 NOTE — Patient Instructions (Signed)
Please go to the basement level to have your labs drawn.  We have given you a Diet next 3-4 days, low fiber. Complete the Augmentin medication. We made you a follow up appointment with Willette Cluster on 12-14-2010. If you develop  A fever or worsening pain, call us asap or go to the closest Emergency Room.

## 2010-11-22 NOTE — Progress Notes (Signed)
[name] [id number] 1934-03-23   HISTORY OR PRESENT ILLNESS : [name] is a 76 year old female with multiple medical problems, all multiple medications including Coumadin. Patient known to Dr. Leone Payor for a history of intestinal arteriovenous malformations and diverticulosis. She was last seen October 2011 and that was for evaluation of weight gain and diarrhea. Diarrhea was felt to be secondary to Dexilant which was put on hold at the time of her visit. [name] gives a history of chronic intermittent diarrhea described as explosive but occurring no more than maybe once every 2 weeks. She also has frequent constipation on iron supplements. She gives a six-month history of nausea with dry heaves. Patient is worked in today for evaluation of acute on chronic diarrhea and lower abdominal pain. Patient was in her usual state of health until about one week ago when she suddenly developed stabbing abdominal pain, initially in the right lower quadrant then settling in the left lower quadrant. She has never had this type of pain before. No urinary symptoms, no unusual vaginal discharge. She went to see her primary care physician who prescribed Augmentin for presumed diverticulitis. I do not have a copy of that blood work. The patient's husband,a retired physician, accompanies her today and tells me that the CBC was basically unremarkable. The best he can remember, hemoglobin was 11.3. Sounds like electrolytes were normal. Creatinine may have been slightly elevated .Two days after starting Augmentin, patient developed significant diarrhea described as several loose stools a day. Her primary care physician started Align probiotics and recommended she followup with [place]. She is on her sixth day of antibiotics and the left lower quadrant pain has improved in that it's more intermittent now.    Current Medications, Allergies, Past Medical History, Past Surgical History, Family History and Social History were reviewed in  [employer] record.   PHYSICAL EXAMINATION : General: Well developed white female in no acute distress Head: Normocephalic and atraumatic Eyes:  sclerae anicteric,conjunctive pink. Ears: Normal auditory acuity Mouth: No deformity or lesions Neck: Supple, no masses.  Lungs: Clear throughout to auscultation Heart: Regular rate and rhythm; no murmurs heard Abdomen: Soft, nondistended. Moderate left lower quadrant tenderness with some guarding. No masses or hepatomegaly noted. Normal bowel sounds Rectal: Not done  Musculoskeletal: Symmetrical with no gross deformities  Skin: No lesions on visible extremities Extremities: No edema or deformities noted Neurological: Alert oriented x 4, grossly nonfocal Cervical Nodes:  No significant cervical adenopathy Psychological:  Alert and cooperative. Normal mood and affect  ASSESSMENT AND PLAN :

## 2010-11-23 ENCOUNTER — Other Ambulatory Visit: Payer: Medicare Other

## 2010-11-23 DIAGNOSIS — R140 Abdominal distension (gaseous): Secondary | ICD-10-CM | POA: Insufficient documentation

## 2010-11-23 DIAGNOSIS — R197 Diarrhea, unspecified: Secondary | ICD-10-CM

## 2010-11-23 DIAGNOSIS — R1032 Left lower quadrant pain: Secondary | ICD-10-CM | POA: Insufficient documentation

## 2010-11-23 NOTE — Assessment & Plan Note (Signed)
Chronic bloating and gas regardless of food type. The problem is significantly impairing her quality of life such that she is unable to eat in a restaurant or even in the dining room where she resides. We discussed bloating and gas and I reassured her that this is a common problem and most often benign. We discussed certain food items to avoid but sounds like she's already doing that. We discussed a future trial of Xifaxan for possible small bowel bacterial overgrowth. This can be discussed in greater detail at her follow up visit.

## 2010-11-23 NOTE — Assessment & Plan Note (Addendum)
Acute on chronic diarrhea. Patient has explosive diarrhea maybe once every 2 weeks. Currently having several loose bowel limits a day while taking Augmentin. Patient has a history of antibiotic associated diarrhea so this is not necessarily unusual but would like to rule out C. difficile. Will check C. difficile by PCR.

## 2010-11-23 NOTE — Assessment & Plan Note (Addendum)
Several day history of left lower quadrant pain, improving on day 6 of Augmentin. No urinary symptoms but will check a urinalysis  Patient has history of diverticulosis, suspect this may be diverticulitis. No peritoneal signs on exam. Recommend low-residue diet for the next few days. Complete course of Augmentin.She has some Hydrocodone at home and will take that if needed.  Return to clinic in 2-3 weeks for reassessment. In the meantime, if pain worsens and/or fever develops, patient will call our office or go to emergency department for evaluation.

## 2010-11-24 ENCOUNTER — Telehealth: Payer: Self-pay

## 2010-11-24 ENCOUNTER — Telehealth: Payer: Self-pay | Admitting: Nurse Practitioner

## 2010-11-24 LAB — CLOSTRIDIUM DIFFICILE BY PCR: Toxigenic C. Difficile by PCR: DETECTED — CR

## 2010-11-24 MED ORDER — METRONIDAZOLE 500 MG PO TABS: 500.0000 mg | ORAL_TABLET | Freq: Three times a day (TID) | ORAL | Status: DC

## 2010-11-24 NOTE — Progress Notes (Signed)
Reviewed and agree with management. Riata Ikeda D. Macallan Ord, M.D., FACG  

## 2010-11-25 ENCOUNTER — Telehealth: Payer: Self-pay | Admitting: Nurse Practitioner

## 2010-11-25 NOTE — Telephone Encounter (Signed)
Spoke with patient's husband and reviewed contact precautions with him.

## 2010-11-26 ENCOUNTER — Ambulatory Visit (INDEPENDENT_AMBULATORY_CARE_PROVIDER_SITE_OTHER): Payer: Medicare Other | Admitting: *Deleted

## 2010-11-26 ENCOUNTER — Other Ambulatory Visit: Payer: Self-pay | Admitting: Internal Medicine

## 2010-11-26 ENCOUNTER — Encounter: Payer: Medicare Other | Admitting: *Deleted

## 2010-11-26 DIAGNOSIS — I4891 Unspecified atrial fibrillation: Secondary | ICD-10-CM

## 2010-11-26 DIAGNOSIS — I459 Conduction disorder, unspecified: Secondary | ICD-10-CM

## 2010-11-26 LAB — POCT INR: INR: 3.7

## 2010-11-29 NOTE — Telephone Encounter (Signed)
Patient called coumadin clinic to schedule appointment and was seen.

## 2010-12-02 NOTE — Progress Notes (Signed)
Pacer remote check  

## 2010-12-03 ENCOUNTER — Ambulatory Visit (INDEPENDENT_AMBULATORY_CARE_PROVIDER_SITE_OTHER): Payer: Medicare Other | Admitting: *Deleted

## 2010-12-03 DIAGNOSIS — I4891 Unspecified atrial fibrillation: Secondary | ICD-10-CM

## 2010-12-03 LAB — POCT INR: INR: 1.7

## 2010-12-04 ENCOUNTER — Encounter: Payer: Self-pay | Admitting: *Deleted

## 2010-12-08 ENCOUNTER — Encounter: Payer: Self-pay | Admitting: Emergency Medicine

## 2010-12-09 ENCOUNTER — Ambulatory Visit (INDEPENDENT_AMBULATORY_CARE_PROVIDER_SITE_OTHER): Payer: Medicare Other | Admitting: *Deleted

## 2010-12-09 ENCOUNTER — Ambulatory Visit: Payer: Medicare Other | Admitting: Emergency Medicine

## 2010-12-09 ENCOUNTER — Telehealth: Payer: Self-pay | Admitting: Nurse Practitioner

## 2010-12-09 DIAGNOSIS — R197 Diarrhea, unspecified: Secondary | ICD-10-CM

## 2010-12-09 DIAGNOSIS — I4891 Unspecified atrial fibrillation: Secondary | ICD-10-CM

## 2010-12-09 LAB — POCT INR: INR: 1.9

## 2010-12-09 NOTE — Telephone Encounter (Signed)
Patient has finished the medication for C. Diff. She has an appointment with Willette Cluster, NP on Tues., She is still having diarrhea. 5 diarrhea stools yesterday, none the day before and one today. States she has occasional cramping and LLQ pain. She has some rectal bleeding when she wipes with a tissue. She wants to know if she can pick up stool specimen kit if needed today since she will be in [place]. Per Willette Cluster, NP, have patient repeat stool for C. Diff. Patient aware.

## 2010-12-13 ENCOUNTER — Encounter: Payer: Self-pay | Admitting: *Deleted

## 2010-12-13 ENCOUNTER — Other Ambulatory Visit: Payer: Medicare Other

## 2010-12-13 DIAGNOSIS — R197 Diarrhea, unspecified: Secondary | ICD-10-CM

## 2010-12-14 ENCOUNTER — Ambulatory Visit (INDEPENDENT_AMBULATORY_CARE_PROVIDER_SITE_OTHER): Payer: Medicare Other | Admitting: Nurse Practitioner

## 2010-12-14 ENCOUNTER — Other Ambulatory Visit: Payer: Medicare Other

## 2010-12-14 ENCOUNTER — Encounter: Payer: Self-pay | Admitting: Nurse Practitioner

## 2010-12-14 ENCOUNTER — Other Ambulatory Visit (INDEPENDENT_AMBULATORY_CARE_PROVIDER_SITE_OTHER): Payer: Medicare Other

## 2010-12-14 DIAGNOSIS — I4891 Unspecified atrial fibrillation: Secondary | ICD-10-CM

## 2010-12-14 DIAGNOSIS — R1032 Left lower quadrant pain: Secondary | ICD-10-CM

## 2010-12-14 DIAGNOSIS — R197 Diarrhea, unspecified: Secondary | ICD-10-CM

## 2010-12-14 LAB — BASIC METABOLIC PANEL
Calcium: 9.1 mg/dL (ref 8.4–10.5)
Creatinine, Ser: 1 mg/dL (ref 0.4–1.2)
GFR: 58.15 mL/min — ABNORMAL LOW (ref >60.00)
Sodium: 138 mEq/L (ref 135–145)

## 2010-12-14 LAB — CLOSTRIDIUM DIFFICILE BY PCR: Toxigenic C. Difficile by PCR: DETECTED — CR

## 2010-12-14 NOTE — Patient Instructions (Signed)
Please go to the basement level to have your labs drawn.  We scheduled the CT scan on Friday 12-17-2010. Directions and brochure provided.

## 2010-12-14 NOTE — Assessment & Plan Note (Addendum)
Persistent diarrhea and left lower quadrant pain in the setting of recent Clostridium difficile infection. Symptoms refractory to metronidazole though her left lower quadrant pain is not as severe as when I saw her a couple of weeks ago. She is still quite tender in the left lower quadrant on exam but otherwise looks okay. Abdomen is soft with bowel sounds. I am concerned about persistent C. difficile colitis, patient has a stool for C. difficile pending from yesterday. If stool sample positive for C. difficile then will start her on oral vancomycin and have her return to see Dr. Leone Payor, her primary gastroenterologist, within a couple of weeks. If the sample is negative then I think we should proceed with CT scan of the abdomen and pelvis for further evaluation. I will touch base with the patient tomorrow, hopefully with her stool study results and further recommendations

## 2010-12-14 NOTE — Progress Notes (Signed)
Tashai Catino Pursel [id number] 1933-12-11   HISTORY OR PRESENT ILLNESS : Mrs. Hipps is a 77 year old female by Dr. Leone Payor. She has multiple medical problems and is on chronic Coumadin. I saw her June 4th for acute on chronic diarrhea and left lower quadrant pain. Her stool was positive for Clostridium difficile.  Patient was treated with Flagyl but has not had any improvement in  diarrhea. She has a history of chronic intermittent diarrhea but that's usually only one episode every few days. She is currently having several episodes of diarrhea today. Ms. Hipps also continues to have intermittent left lower quadrant pain unrelated to meals, defecation or movements. The pain is not as severe as what it was when she came to see me a couple of weeks ago.   Current Medications, Allergies, Past Medical History, Past Surgical History, Family History and Social History were reviewed in [employer] record.   PHYSICAL EXAMINATION : General: Well developed  female in no acute distress Head: Normocephalic and atraumatic Eyes:  sclerae anicteric,conjunctive pink. Ears: Normal auditory acuity Mouth: No deformity or lesions Neck: Supple, no masses.  Lungs: Clear throughout to auscultation Heart: Regular rate and rhythm Abdomen: Soft, nondistended. Significant left lower quadrant tenderness. No peritoneal signs. Normal bowel sounds Rectal: Not done Musculoskeletal: Symmetrical with no gross deformities  Skin: No lesions on visible extremities Extremities: No edema or deformities noted Neurological: Alert oriented x 4, grossly nonfocal Cervical Nodes:  No significant cervical adenopathy Psychological:  Alert and cooperative. Normal mood and affect  ASSESSMENT AND PLAN :

## 2010-12-15 ENCOUNTER — Telehealth: Payer: Self-pay

## 2010-12-15 MED ORDER — VANCOMYCIN HCL POWD: Status: DC

## 2010-12-15 NOTE — Telephone Encounter (Signed)
Willette Cluster RNP called this am with orders for the patient .  C-diff was positive #1 cancel upcoming CT scan #2 Vancomycin 250 mg po qid for 14 days #3 Follow up with Dr Leone Payor next week   I did speak with the patient's husband and he is aware of all the above.  Rx will be compounded at [facility].  She is scheduled to see Dr Leone Payor on 12/20/10.  They will call back for any questions or problems.

## 2010-12-15 NOTE — Progress Notes (Signed)
Agree with initial assessment and plans. If stool study negative, could add questran empirically

## 2010-12-16 ENCOUNTER — Other Ambulatory Visit: Payer: Self-pay | Admitting: *Deleted

## 2010-12-16 DIAGNOSIS — R1032 Left lower quadrant pain: Secondary | ICD-10-CM

## 2010-12-16 MED ORDER — HYOSCYAMINE SULFATE 0.125 MG SL SUBL: SUBLINGUAL_TABLET | SUBLINGUAL | Status: DC

## 2010-12-16 NOTE — Telephone Encounter (Signed)
Willette Cluster ACNP called to ask me to send a prescription to [facility], [employer] for [employer].   I sent it today Levsin Sl 0.125 MG she is to take 1 on the tongue or under the tongue, sublingual, 3 times a day as needed for cramps.

## 2010-12-16 NOTE — Telephone Encounter (Signed)
I spoke to the patient and advised her per Willette Cluster ACNP, I sent a prescription to [facility] for [employer].  I gave her the instructions on how to take the medication. She thanked me for calling.

## 2010-12-17 ENCOUNTER — Encounter: Payer: Medicare Other | Admitting: *Deleted

## 2010-12-17 ENCOUNTER — Other Ambulatory Visit: Payer: Medicare Other

## 2010-12-19 DIAGNOSIS — E871 Hypo-osmolality and hyponatremia: Secondary | ICD-10-CM

## 2010-12-19 DIAGNOSIS — E860 Dehydration: Secondary | ICD-10-CM

## 2010-12-19 HISTORY — DX: Hypo-osmolality and hyponatremia: E87.1

## 2010-12-19 HISTORY — DX: Dehydration: E86.0

## 2010-12-20 ENCOUNTER — Encounter: Payer: Self-pay | Admitting: Internal Medicine

## 2010-12-20 ENCOUNTER — Ambulatory Visit (INDEPENDENT_AMBULATORY_CARE_PROVIDER_SITE_OTHER): Payer: Medicare Other | Admitting: Internal Medicine

## 2010-12-20 ENCOUNTER — Other Ambulatory Visit (INDEPENDENT_AMBULATORY_CARE_PROVIDER_SITE_OTHER): Payer: Medicare Other

## 2010-12-20 ENCOUNTER — Telehealth: Payer: Self-pay | Admitting: *Deleted

## 2010-12-20 VITALS — BP 124/76 | HR 88 | Ht 64.0 in | Wt 176.0 lb

## 2010-12-20 DIAGNOSIS — D500 Iron deficiency anemia secondary to blood loss (chronic): Secondary | ICD-10-CM

## 2010-12-20 DIAGNOSIS — K5732 Diverticulitis of large intestine without perforation or abscess without bleeding: Secondary | ICD-10-CM | POA: Insufficient documentation

## 2010-12-20 DIAGNOSIS — A0472 Enterocolitis due to Clostridium difficile, not specified as recurrent: Secondary | ICD-10-CM

## 2010-12-20 DIAGNOSIS — E875 Hyperkalemia: Secondary | ICD-10-CM

## 2010-12-20 DIAGNOSIS — R1032 Left lower quadrant pain: Secondary | ICD-10-CM

## 2010-12-20 DIAGNOSIS — Z7901 Long term (current) use of anticoagulants: Secondary | ICD-10-CM

## 2010-12-20 LAB — POTASSIUM: Potassium: 4.8 mEq/L (ref 3.5–5.1)

## 2010-12-20 NOTE — Assessment & Plan Note (Signed)
She is about halfway into a two-week course of vancomycin that she should not seem to respond to metronidazole. We'll finish the two-week course. Further plans pending that and the CT results.

## 2010-12-20 NOTE — Assessment & Plan Note (Signed)
Labs I have show a stable hemoglobin despite iron supplementation over time. It could be that she is losing blood, she could also have a component of anemia of chronic disease. She is quite intolerant of iron and had itching and she received a test dose for iron dextran. She would rather have some different type of iron formulation so after discussion with the patient and her husband we'll plan for a hematology referral to clear up these other issues with C. difficile and left lower quadrant pain and possible diverticulitis.

## 2010-12-20 NOTE — Telephone Encounter (Signed)
Spoke with patient's husband and patient will have potassium rechecked today when she comes for visit with Dr. Leone Payor.

## 2010-12-20 NOTE — Progress Notes (Signed)
  Subjective:    Patient ID: [name], female    DOB: 1934-04-01, 77 y.o.   MRN: [id number]  HPI 77 year old married white woman who has been seen here by her nurse practitioner for the past couple of weeks. She presented with left lower quadrant pain and her primary care provider started Augmentin. She rapidly developed diarrhea. She was seen here and had a C. difficile test done and it was positive so a CT scan and an order was she had initially been treated with metronidazole which did not seem to make a difference and she has been switched to vancomycin 250 mg by mouth 4 times a day and is almost halfway through a two-week course of that. Her stools are firming up a little bit though there still loose.  She continues to have intermittent sharp and crampy left lower quadrant pain and tenderness which was her initial problem. She has not had fever. She notices the pain can be associated with urination but does not really recall this related to defecation in any way.  She has stopped her iron for the last 3 weeks. It causes her stomach upset and she was also concerned that she would not be able to tell if she were bleeding while on iron so she's held it for 3 weeks.   Review of Systems Elevated potassium noted, lab testing is pending from today. Otherwise as per history of present illness.    Objective:   Physical Exam Well-developed well-nourished and well in no acute distress Abdomen soft, she has persistent reproducible left lower quadrant tenderness with some mild guarding. No sounds are present.       Assessment & Plan:

## 2010-12-20 NOTE — Patient Instructions (Signed)
You have been scheduled for a CT scan of the abdomen and pelvis at [facility] ([address]---this is in the same building as [facility]).   You are scheduled on  12/21/10 at 9:00am. You should arrive 15 minutes prior to your appointment time for registration. Please follow the written instructions below on the day of your exam:  1) Do not eat or drink anything after 5:00 am (4 hours prior to your test) 2) You have been given 2 bottles of oral contrast to drink. The solution may taste               better if refrigerated, but do NOT add ice or any other liquid to this solution. Shake             well before drinking.  Drink 1 bottle of contrast @ 7: 00 am (2 hours prior to your exam)  Drink 1 bottle of contrast @ 8:00 am (1 hour prior to your exam)  You may take any medications as prescribed with a small amount of water except for the following: Metformin, Glucophage, Glucovance, Avandamet, Riomet, Fortamet, Actoplus Met, Janumet, Glumetza or Metaglip. The above medications must be held the day of the exam and 48 hours after the exam.  The purpose of you drinking the oral contrast is to aid in the visualization of your intestinal tract. The contrast solution may cause some diarrhea. Before your exam is started, you will be given a small amount of fluid to drink. Depending on your individual set of symptoms, you may also receive an intravenous injection of x-ray contrast/dye.  If you have any questions regarding your exam or if you need to reschedule, you may call the CT department at [phone] between the hours of 8:00 am and 5:00 pm, Monday-Friday.  We will contact you will assessment and plans. ________________________________________________________________________

## 2010-12-20 NOTE — Telephone Encounter (Signed)
error 

## 2010-12-20 NOTE — Assessment & Plan Note (Signed)
She may actually have diverticulitis perhaps. Not clear, given the persistence despite treatment and improvement of C. difficile colitis we'll go ahead with a CT scan of the abdomen and pelvis. Further plans pending that.

## 2010-12-20 NOTE — Telephone Encounter (Signed)
Message copied by Daphine Deutscher on Mon Dec 20, 2010  9:41 AM ------      Message from: Meredith Pel      Created: Fri Dec 17, 2010 11:49 AM       Rene Kocher, please have patient come for recheck of serum potassium on Monday.  Also, please make sure she is scheduled to see Leone Payor next week Lavonna Rua was going to make appt. ). Thanks

## 2010-12-21 ENCOUNTER — Telehealth: Payer: Self-pay | Admitting: Internal Medicine

## 2010-12-21 ENCOUNTER — Ambulatory Visit (INDEPENDENT_AMBULATORY_CARE_PROVIDER_SITE_OTHER): Payer: Medicare Other | Admitting: *Deleted

## 2010-12-21 ENCOUNTER — Ambulatory Visit (INDEPENDENT_AMBULATORY_CARE_PROVIDER_SITE_OTHER)
Admission: RE | Admit: 2010-12-21 | Discharge: 2010-12-21 | Disposition: A | Discharge status: Home / Self Care | Payer: Medicare Other | Source: Ambulatory Visit | Attending: Internal Medicine | Admitting: Internal Medicine

## 2010-12-21 DIAGNOSIS — Z7901 Long term (current) use of anticoagulants: Secondary | ICD-10-CM | POA: Insufficient documentation

## 2010-12-21 DIAGNOSIS — R1032 Left lower quadrant pain: Secondary | ICD-10-CM

## 2010-12-21 DIAGNOSIS — I4891 Unspecified atrial fibrillation: Secondary | ICD-10-CM

## 2010-12-21 MED ORDER — IOHEXOL 300 MG/ML  SOLN
100.0000 mL | Freq: Once | INTRAMUSCULAR | Status: AC | PRN
  Administered 2010-12-21: 100 mL via INTRAVENOUS

## 2010-12-21 MED ORDER — CIPROFLOXACIN HCL 500 MG PO TABS: 500.0000 mg | ORAL_TABLET | Freq: Two times a day (BID) | ORAL | Status: DC

## 2010-12-21 MED ORDER — METRONIDAZOLE 500 MG PO TABS: 500.0000 mg | ORAL_TABLET | Freq: Three times a day (TID) | ORAL | Status: DC

## 2010-12-21 NOTE — Assessment & Plan Note (Addendum)
LLQ pain due to diverticulitis as seen on CT today (day after visit). Small contained leak/perforation. Also C diff + so will continue vancomycin but need to treat with antibiotics for the diverticulitis. Will use cipro and metronidazole x 14 days since she had terrible diarrhea with Augmentin before. Needs follow-up recheck with me or extender next week. Any deterioration in her need to carefully consider IV treatment due to comorbidities.

## 2010-12-21 NOTE — Telephone Encounter (Signed)
Questions answered.

## 2010-12-21 NOTE — Progress Notes (Signed)
Quick Note:  Let her and/or husband know that she does have diverticulitis with very small developing abscess I believe antibiotics by mouth can treat this but we need close follow-up with visit to me or Gunnar Fusi next week I have rxed cipro and metronidazole (has to stop the wine again) and we will continue the vancomycin. I wrote the rx for cipro/metronidazole in yesterdays encounter (completed today).  She needs an extension of vancomycin to stay on it while she is on cipro and metronidazole (I need you to extend the vancomycin)  She needs to watch for worsening pain, fever, other new bowel problems. She should have an INR recheck next week ( I think they are doing so) and let warfarin clinic ([facility]) know we are adding antibiotics  Low residue diet also ______

## 2010-12-21 NOTE — Assessment & Plan Note (Signed)
Needs close follow-up of INR on antibiotics

## 2010-12-23 ENCOUNTER — Telehealth: Payer: Self-pay

## 2010-12-23 MED ORDER — ONDANSETRON HCL 4 MG PO TABS: 4.0000 mg | ORAL_TABLET | Freq: Four times a day (QID) | ORAL | Status: DC

## 2010-12-23 NOTE — Telephone Encounter (Signed)
rx sent

## 2010-12-27 ENCOUNTER — Ambulatory Visit (INDEPENDENT_AMBULATORY_CARE_PROVIDER_SITE_OTHER): Payer: Medicare Other | Admitting: *Deleted

## 2010-12-27 DIAGNOSIS — I4891 Unspecified atrial fibrillation: Secondary | ICD-10-CM

## 2010-12-29 ENCOUNTER — Telehealth: Payer: Self-pay | Admitting: Internal Medicine

## 2010-12-29 ENCOUNTER — Ambulatory Visit (INDEPENDENT_AMBULATORY_CARE_PROVIDER_SITE_OTHER): Payer: Medicare Other | Admitting: Internal Medicine

## 2010-12-29 ENCOUNTER — Encounter: Payer: Self-pay | Admitting: Internal Medicine

## 2010-12-29 ENCOUNTER — Encounter: Payer: Self-pay | Admitting: Emergency Medicine

## 2010-12-29 ENCOUNTER — Ambulatory Visit (INDEPENDENT_AMBULATORY_CARE_PROVIDER_SITE_OTHER): Payer: Medicare Other | Admitting: Emergency Medicine

## 2010-12-29 ENCOUNTER — Telehealth: Payer: Self-pay | Admitting: Gastroenterology

## 2010-12-29 DIAGNOSIS — A0472 Enterocolitis due to Clostridium difficile, not specified as recurrent: Secondary | ICD-10-CM

## 2010-12-29 DIAGNOSIS — K5732 Diverticulitis of large intestine without perforation or abscess without bleeding: Secondary | ICD-10-CM

## 2010-12-29 DIAGNOSIS — J45909 Unspecified asthma, uncomplicated: Secondary | ICD-10-CM

## 2010-12-29 DIAGNOSIS — R05 Cough: Secondary | ICD-10-CM

## 2010-12-29 DIAGNOSIS — J383 Other diseases of vocal cords: Secondary | ICD-10-CM

## 2010-12-29 DIAGNOSIS — D500 Iron deficiency anemia secondary to blood loss (chronic): Secondary | ICD-10-CM

## 2010-12-29 MED ORDER — CIPROFLOXACIN HCL 500 MG PO TABS: 500.0000 mg | ORAL_TABLET | Freq: Two times a day (BID) | ORAL | Status: DC

## 2010-12-29 MED ORDER — METRONIDAZOLE 500 MG PO TABS: 500.0000 mg | ORAL_TABLET | Freq: Three times a day (TID) | ORAL | Status: AC

## 2010-12-29 MED ORDER — VANCOMYCIN HCL POWD: Status: DC

## 2010-12-29 NOTE — Assessment & Plan Note (Signed)
Would avoid any further botox injections given risk for dysphagia

## 2010-12-29 NOTE — Progress Notes (Signed)
  Subjective:    Patient ID: [name], female    DOB: [birth year], 77 y.o.   MRN: [id number]  HPI76 yo ww with current C. Diff colitis, diverticulitis and small abscess on outpatient antibiotics. Here with husband today. Less LLQ cramping but still gets some -  Zofran helps nausea Sweats/hot flashes are gone She is having loose but less frequent stools  No fever Overall better    Review of Systems     Objective:   Physical Exam  chronically ill and NAD   Abdomen soft and mild-mod tender in LLQ without rebound or guarding - improved vs. Last week   Assessment & Plan:

## 2010-12-29 NOTE — Assessment & Plan Note (Signed)
Currently stable.

## 2010-12-29 NOTE — Assessment & Plan Note (Signed)
Try for IV iron again later

## 2010-12-29 NOTE — Assessment & Plan Note (Signed)
She is improving on Cipro and metronidazole, we'll continue this probably for a month. We'll refill the Cipro and metronidazole, I will see her in a week to 10 days to reassess. At the end of the month or so she'll need repeat imaging to follow up on the abscess.

## 2010-12-29 NOTE — Patient Instructions (Addendum)
Return in 7-10 days to see Dr. Leone Payor. Your Prescription will be called into [facility] at [facility]. Your prescription(s) has(have) been sent to your pharmacy for you to pick up.

## 2010-12-29 NOTE — Patient Instructions (Signed)
Please continue your Xolair, prednisone, QVAR and albuterol as you are taking them Follow with Dr Delton Coombes in 6 months or sooner if your breathing changes in any way.

## 2010-12-29 NOTE — Progress Notes (Signed)
  Subjective:    Patient ID: [name], female    DOB: 11/17/1933, 77 y.o.   MRN: [id number]  HPI 77 yo woman, former smoker, carries hx of asthma dx in 1989 at [facility]. In retrospect probably had childhood asthma as well. Also with allergies, reported VCD (with speech rx in the past). She has been treated with bronchodilators, ICS, systemic steroids x 27 years (c/b adrenal insuff when she has stopped), on Xolair per Dr Lucie Leather - feels better on the xolair. Not on immunotherapy, has been in distant past. Currently on Spiriva + QVAR. 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 Seen at [facility] April '11 for her cough. CT scan perfomed there to eval bronchiectasis. Then referred to speech therapy [facility] , received botox UA injection with improvement in symptoms.  Has been rx agressively for GERD. She keeps a hoarse voice, a chronic cough. Presents today regarding the cough.   ROV 05/04/10 -- Hx VCD, asthma, allergies. She had Botox injections 6 weeks ago, may have been beneficial but it causes some swallowing difficulties. Still w cough, voice hoarse. Her nasal drainage has worsened since last visit, as has the cough. Started the loratadine last time, is on rhinocort, never started the NSWs, taking pantoprazole. Was never referred to speech therapy. We stopped Spiriva and she doesn't miss it (but cough remained).   ROV 12/29/10 -- returns for VCD, asthma, allergies. Tells me that she was admitted last November for ? Bronchitis, treated with abx and steroids. Her baseline pred dose is 7.5mg  qd. Stable xolair dosing w Dr Lucie Leather. Takes QVAR bid. Tells me that she has ben evaluated by Dr Felipa Eth and Dr Leone Payor for abd pain, ? Diverticular dz - course then c/b C. Diff colitis. CT scan showed a small abscess assoc with diverticular dz - currently on vanco PO + cipro/flagyl.     Review of Systems As per HPI.     Objective:   Physical Exam Gen: Pleasant, well-nourished,  in no distress,  normal affect  ENT: No lesions,  mouth clear,  oropharynx clear, no postnasal drip  Neck: No JVD, no TMG, no carotid bruits  Lungs:   Cardiovascular: RRR, heart sounds normal, no murmur or gallops, no peripheral edema  Musculoskeletal: No deformities, no cyanosis or clubbing  Neuro: alert, non focal  Skin: Warm, no lesions or rashes       Assessment & Plan:  COUGH Cough w VCD, probably some true asthma. Has had botox in past but it causes dysphagia. She is treated for GERD, allergic rhinitis.  - continue xolair, pred, QVAR, albuterol.  - rov 6 months.   ASTHMA Currently stable  OTHER DISEASES OF VOCAL CORDS Would avoid any further botox injections given risk for dysphagia

## 2010-12-29 NOTE — Telephone Encounter (Signed)
I have left a message for the patient that I have scheduled her for REV 01/05/11 10:15.  She is to call back for any questions

## 2010-12-29 NOTE — Assessment & Plan Note (Signed)
Improved, continue treatment with vancomycin to go to 3 times a day while she is on antibiotics for diverticulitis

## 2010-12-29 NOTE — Assessment & Plan Note (Signed)
Cough w VCD, probably some true asthma. Has had botox in past but it causes dysphagia. She is treated for GERD, allergic rhinitis.  - continue xolair, pred, QVAR, albuterol.  - rov 6 months.

## 2010-12-29 NOTE — Telephone Encounter (Signed)
Refilled Metronidazole and Cipro earlier with a 28 day supply. Called the pharmacy and informed them to refill medication for a 14 day supply because patient already have a 14 day supply to make 28 days.

## 2010-12-30 ENCOUNTER — Encounter: Payer: Medicare Other | Admitting: *Deleted

## 2010-12-30 ENCOUNTER — Encounter: Payer: Self-pay | Admitting: Internal Medicine

## 2011-01-04 ENCOUNTER — Other Ambulatory Visit: Payer: Self-pay | Admitting: Emergency Medicine

## 2011-01-05 ENCOUNTER — Ambulatory Visit (INDEPENDENT_AMBULATORY_CARE_PROVIDER_SITE_OTHER): Payer: Medicare Other | Admitting: Internal Medicine

## 2011-01-05 ENCOUNTER — Other Ambulatory Visit (INDEPENDENT_AMBULATORY_CARE_PROVIDER_SITE_OTHER): Payer: Medicare Other

## 2011-01-05 ENCOUNTER — Encounter: Payer: Self-pay | Admitting: Internal Medicine

## 2011-01-05 VITALS — BP 90/42 | HR 62 | Wt 185.0 lb

## 2011-01-05 DIAGNOSIS — R05 Cough: Secondary | ICD-10-CM

## 2011-01-05 DIAGNOSIS — R5381 Other malaise: Secondary | ICD-10-CM

## 2011-01-05 DIAGNOSIS — R531 Weakness: Secondary | ICD-10-CM

## 2011-01-05 DIAGNOSIS — K5732 Diverticulitis of large intestine without perforation or abscess without bleeding: Secondary | ICD-10-CM

## 2011-01-05 DIAGNOSIS — D500 Iron deficiency anemia secondary to blood loss (chronic): Secondary | ICD-10-CM

## 2011-01-05 DIAGNOSIS — R5383 Other fatigue: Secondary | ICD-10-CM

## 2011-01-05 DIAGNOSIS — A0472 Enterocolitis due to Clostridium difficile, not specified as recurrent: Secondary | ICD-10-CM

## 2011-01-05 LAB — CBC WITH DIFFERENTIAL/PLATELET
Basophils Relative: 0.5 % (ref 0.0–3.0)
Eosinophils Absolute: 0.1 10*3/uL (ref 0.0–0.7)
Eosinophils Relative: 0.9 % (ref 0.0–5.0)
HCT: 33.5 % — ABNORMAL LOW (ref 36.0–46.0)
Lymphs Abs: 1.3 10*3/uL (ref 0.7–4.0)
MCHC: 33.3 g/dL (ref 30.0–36.0)
MCV: 86.4 fl (ref 78.0–100.0)
Monocytes Absolute: 1.7 10*3/uL — ABNORMAL HIGH (ref 0.1–1.0)
Neutrophils Relative %: 74.5 % (ref 43.0–77.0)
Platelets: 356 10*3/uL (ref 150.0–400.0)
WBC: 12.4 10*3/uL — ABNORMAL HIGH (ref 4.5–10.5)

## 2011-01-05 LAB — COMPREHENSIVE METABOLIC PANEL
BUN: 16 mg/dL (ref 6–23)
CO2: 25 mEq/L (ref 19–32)
Creatinine, Ser: 1.2 mg/dL (ref 0.4–1.2)
GFR: 47.94 mL/min — ABNORMAL LOW (ref >60.00)
Glucose, Bld: 97 mg/dL (ref 70–99)
Total Bilirubin: 0.5 mg/dL (ref 0.3–1.2)

## 2011-01-05 LAB — TSH: TSH: 1.27 u[IU]/mL (ref 0.35–5.50)

## 2011-01-05 NOTE — Patient Instructions (Addendum)
Go the basement for your labs today and we will call with results and plans. Do not take candesarten (Atacand) today due to your blood pressure readings. We will figure out your follow up visit after we get the results of your labs.

## 2011-01-05 NOTE — Assessment & Plan Note (Addendum)
Better - still tender LLQ Continue antibiotics - cipro and metronidazole and follow-up with CT at end of 4 weeks therapy

## 2011-01-05 NOTE — Progress Notes (Signed)
Quick Note:  I called her husband, Dr. Crista Elliot Explained hyponatremia and mild dehydration is dx  To stay off candesartan and also furosemide (did not take today) He will check her BP She needs to get BMET on Fri AM Also to increase fluids, Gatorade or other electrolyte replenisher  Will arrange follow-up in office based upon the labs  RN to order BMET re: hyponatremia for 7/20 to do in AM ______

## 2011-01-05 NOTE — Progress Notes (Signed)
  Subjective:    Patient ID: [name], female    DOB: Nov 05, 1933, 77 y.o.   MRN: [id number]  HPI76 yo ww here to follow-up diverticulitis and abscess/perforation. Constant diarrhea still with variable amounts. Actually went out for a meal yesterday as Gi sxs were a bit better but felt worse after "mild [place] beef quesidilla". Then very fatigued and tired and this AM could hardly move.   Weak, diffusely weak with a cough, especially nightly with small clear sputum. Last few nights have been bad. Saw Dr. Delton Coombes last week. "Lungs were clear".  She and husband say she is much weker today.  Husband provides some history.   Review of Systems     Objective:   Physical Exam  Constitutional:       Chronically ill No acute distress weak  HENT:       Mild posterior cobblestoning, otherwise clear  Neck: No JVD present.  Cardiovascular:       Irregular rhythm, ? Murmur but not heard on repeat exam Rate normal No gallops  Pulmonary/Chest: Effort normal. She has no wheezes. She has no rales.  Abdominal: Soft. Bowel sounds are normal. She exhibits no distension and no mass. There is tenderness.       Moderately tender on first palpation in LLQ but much better with repeated exam  Neurological:       Short-term memory impaired          Assessment & Plan:

## 2011-01-05 NOTE — Assessment & Plan Note (Signed)
Stable to better

## 2011-01-06 ENCOUNTER — Telehealth: Payer: Self-pay | Admitting: *Deleted

## 2011-01-06 ENCOUNTER — Telehealth: Payer: Self-pay

## 2011-01-06 ENCOUNTER — Other Ambulatory Visit: Payer: Self-pay | Admitting: Internal Medicine

## 2011-01-06 ENCOUNTER — Encounter: Payer: Medicare Other | Admitting: *Deleted

## 2011-01-06 DIAGNOSIS — E860 Dehydration: Secondary | ICD-10-CM

## 2011-01-06 DIAGNOSIS — E871 Hypo-osmolality and hyponatremia: Secondary | ICD-10-CM

## 2011-01-06 NOTE — Telephone Encounter (Signed)
Message copied by Daphine Deutscher on Thu Jan 06, 2011  8:41 AM ------      Message from: Stan Head E      Created: Wed Jan 05, 2011  5:04 PM       I called her husband, Dr. Crista Elliot      Explained hyponatremia and mild dehydration is dx            To stay off candesartan and also furosemide (did not take today)      He will check her BP      She needs to get BMET on Fri AM      Also to increase fluids, Gatorade or other electrolyte replenisher            Will arrange follow-up in office based upon the labs            RN to order BMET re: hyponatremia for 7/20 to do in AM

## 2011-01-06 NOTE — Assessment & Plan Note (Signed)
Still planning for iron infusion pending resolution of diverticulitis and C diff

## 2011-01-06 NOTE — Telephone Encounter (Signed)
Message copied by Michele Mcalpine on Thu Jan 06, 2011  1:05 PM ------      Message from: Stan Head E      Created: Wed Jan 05, 2011  5:04 PM       I called her husband, Dr. Crista Elliot      Explained hyponatremia and mild dehydration is dx            To stay off candesartan and also furosemide (did not take today)      He will check her BP      She needs to get BMET on Fri AM      Also to increase fluids, Gatorade or other electrolyte replenisher            Will arrange follow-up in office based upon the labs            RN to order BMET re: hyponatremia for 7/20 to do in AM

## 2011-01-06 NOTE — Telephone Encounter (Signed)
Spoke with Dr. Crista Elliot and he is aware of Dr. Marvell Fuller recommendations. Lab orders entered.

## 2011-01-06 NOTE — Telephone Encounter (Signed)
Patient notified of lab appointment on 01/07/11 in AM.

## 2011-01-06 NOTE — Telephone Encounter (Signed)
Labs in [facility] for 01/07/11. Left a message for patient's husband to call me.

## 2011-01-07 ENCOUNTER — Inpatient Hospital Stay (HOSPITAL_COMMUNITY)
Admission: EM | Admit: 2011-01-07 | Discharge: 2011-01-14 | DRG: 391 | Disposition: A | Discharge status: Home Health | Payer: Medicare Other | Attending: Internal Medicine | Admitting: Internal Medicine

## 2011-01-07 ENCOUNTER — Ambulatory Visit (INDEPENDENT_AMBULATORY_CARE_PROVIDER_SITE_OTHER): Payer: Medicare Other | Admitting: *Deleted

## 2011-01-07 ENCOUNTER — Other Ambulatory Visit (INDEPENDENT_AMBULATORY_CARE_PROVIDER_SITE_OTHER): Payer: Medicare Other

## 2011-01-07 ENCOUNTER — Emergency Department (HOSPITAL_COMMUNITY): Payer: Medicare Other

## 2011-01-07 ENCOUNTER — Telehealth: Payer: Self-pay

## 2011-01-07 DIAGNOSIS — J449 Chronic obstructive pulmonary disease, unspecified: Secondary | ICD-10-CM | POA: Diagnosis present

## 2011-01-07 DIAGNOSIS — IMO0002 Reserved for concepts with insufficient information to code with codable children: Secondary | ICD-10-CM

## 2011-01-07 DIAGNOSIS — E871 Hypo-osmolality and hyponatremia: Secondary | ICD-10-CM | POA: Diagnosis present

## 2011-01-07 DIAGNOSIS — E860 Dehydration: Secondary | ICD-10-CM

## 2011-01-07 DIAGNOSIS — Z888 Allergy status to other drugs, medicaments and biological substances status: Secondary | ICD-10-CM

## 2011-01-07 DIAGNOSIS — I4891 Unspecified atrial fibrillation: Secondary | ICD-10-CM

## 2011-01-07 DIAGNOSIS — K219 Gastro-esophageal reflux disease without esophagitis: Secondary | ICD-10-CM | POA: Diagnosis present

## 2011-01-07 DIAGNOSIS — Z79899 Other long term (current) drug therapy: Secondary | ICD-10-CM

## 2011-01-07 DIAGNOSIS — Z7901 Long term (current) use of anticoagulants: Secondary | ICD-10-CM

## 2011-01-07 DIAGNOSIS — M4120 Other idiopathic scoliosis, site unspecified: Secondary | ICD-10-CM | POA: Diagnosis present

## 2011-01-07 DIAGNOSIS — M1610 Unilateral primary osteoarthritis, unspecified hip: Secondary | ICD-10-CM | POA: Diagnosis present

## 2011-01-07 DIAGNOSIS — Z950 Presence of cardiac pacemaker: Secondary | ICD-10-CM

## 2011-01-07 DIAGNOSIS — Z22322 Carrier or suspected carrier of Methicillin resistant Staphylococcus aureus: Secondary | ICD-10-CM

## 2011-01-07 DIAGNOSIS — E039 Hypothyroidism, unspecified: Secondary | ICD-10-CM | POA: Diagnosis present

## 2011-01-07 DIAGNOSIS — Z882 Allergy status to sulfonamides status: Secondary | ICD-10-CM

## 2011-01-07 DIAGNOSIS — E785 Hyperlipidemia, unspecified: Secondary | ICD-10-CM | POA: Diagnosis present

## 2011-01-07 DIAGNOSIS — K449 Diaphragmatic hernia without obstruction or gangrene: Secondary | ICD-10-CM | POA: Diagnosis present

## 2011-01-07 DIAGNOSIS — G4700 Insomnia, unspecified: Secondary | ICD-10-CM | POA: Diagnosis present

## 2011-01-07 DIAGNOSIS — D500 Iron deficiency anemia secondary to blood loss (chronic): Secondary | ICD-10-CM | POA: Diagnosis present

## 2011-01-07 DIAGNOSIS — R5381 Other malaise: Secondary | ICD-10-CM | POA: Diagnosis not present

## 2011-01-07 DIAGNOSIS — F0390 Unspecified dementia without behavioral disturbance: Secondary | ICD-10-CM | POA: Diagnosis present

## 2011-01-07 DIAGNOSIS — I5021 Acute systolic (congestive) heart failure: Secondary | ICD-10-CM | POA: Diagnosis not present

## 2011-01-07 DIAGNOSIS — K630 Abscess of intestine: Secondary | ICD-10-CM | POA: Diagnosis present

## 2011-01-07 DIAGNOSIS — Z87891 Personal history of nicotine dependence: Secondary | ICD-10-CM

## 2011-01-07 DIAGNOSIS — J4489 Other specified chronic obstructive pulmonary disease: Secondary | ICD-10-CM | POA: Diagnosis present

## 2011-01-07 DIAGNOSIS — A0472 Enterocolitis due to Clostridium difficile, not specified as recurrent: Secondary | ICD-10-CM | POA: Diagnosis present

## 2011-01-07 DIAGNOSIS — B370 Candidal stomatitis: Secondary | ICD-10-CM | POA: Diagnosis present

## 2011-01-07 DIAGNOSIS — F341 Dysthymic disorder: Secondary | ICD-10-CM | POA: Diagnosis present

## 2011-01-07 DIAGNOSIS — Z853 Personal history of malignant neoplasm of breast: Secondary | ICD-10-CM

## 2011-01-07 DIAGNOSIS — R627 Adult failure to thrive: Secondary | ICD-10-CM | POA: Diagnosis present

## 2011-01-07 DIAGNOSIS — D638 Anemia in other chronic diseases classified elsewhere: Secondary | ICD-10-CM | POA: Diagnosis present

## 2011-01-07 DIAGNOSIS — I509 Heart failure, unspecified: Secondary | ICD-10-CM | POA: Diagnosis present

## 2011-01-07 DIAGNOSIS — E7800 Pure hypercholesterolemia, unspecified: Secondary | ICD-10-CM | POA: Diagnosis present

## 2011-01-07 DIAGNOSIS — K5732 Diverticulitis of large intestine without perforation or abscess without bleeding: Principal | ICD-10-CM | POA: Diagnosis present

## 2011-01-07 DIAGNOSIS — I10 Essential (primary) hypertension: Secondary | ICD-10-CM | POA: Diagnosis present

## 2011-01-07 LAB — URINALYSIS, ROUTINE W REFLEX MICROSCOPIC
Bilirubin Urine: NEGATIVE
Ketones, ur: NEGATIVE mg/dL
Leukocytes, UA: NEGATIVE
Nitrite: NEGATIVE
Specific Gravity, Urine: 1.013 (ref 1.005–1.030)
Urobilinogen, UA: 0.2 mg/dL (ref 0.0–1.0)

## 2011-01-07 LAB — COMPREHENSIVE METABOLIC PANEL
ALT: 12 U/L (ref 0–35)
AST: 21 U/L (ref 0–37)
Alkaline Phosphatase: 58 U/L (ref 39–117)
CO2: 23 mEq/L (ref 19–32)
Calcium: 8.8 mg/dL (ref 8.4–10.5)
Chloride: 85 mEq/L — ABNORMAL LOW (ref 96–112)
GFR calc Af Amer: >60 mL/min (ref >60)
GFR calc non Af Amer: >60 mL/min (ref >60)
Glucose, Bld: 104 mg/dL — ABNORMAL HIGH (ref 70–99)
Potassium: 4.2 mEq/L (ref 3.5–5.1)
Sodium: 117 mEq/L — CL (ref 135–145)

## 2011-01-07 LAB — BASIC METABOLIC PANEL
Chloride: 91 mEq/L — ABNORMAL LOW (ref 96–112)
Creatinine, Ser: 0.8 mg/dL (ref 0.4–1.2)
Potassium: 4.5 mEq/L (ref 3.5–5.1)
Sodium: 122 mEq/L — ABNORMAL LOW (ref 135–145)

## 2011-01-07 LAB — POCT INR: INR: 3.6

## 2011-01-07 LAB — PROTIME-INR: INR: 2.97 — ABNORMAL HIGH (ref 0.00–1.49)

## 2011-01-07 LAB — CBC
MCV: 82.5 fL (ref 78.0–100.0)
Platelets: 272 10*3/uL (ref 150–400)
RBC: 3.6 MIL/uL — ABNORMAL LOW (ref 3.87–5.11)
WBC: 10.6 10*3/uL — ABNORMAL HIGH (ref 4.0–10.5)

## 2011-01-07 LAB — DIFFERENTIAL
Eosinophils Absolute: 0.2 10*3/uL (ref 0.0–0.7)
Lymphocytes Relative: 10 % — ABNORMAL LOW (ref 12–46)
Lymphs Abs: 1.1 10*3/uL (ref 0.7–4.0)
Neutrophils Relative %: 77 % (ref 43–77)

## 2011-01-07 LAB — TROPONIN I: Troponin I: <0.3 ng/mL (ref <0.30)

## 2011-01-07 LAB — CK TOTAL AND CKMB (NOT AT ARMC): Relative Index: 3.9 — ABNORMAL HIGH (ref 0.0–2.5)

## 2011-01-07 LAB — APTT: aPTT: 49 seconds — ABNORMAL HIGH (ref 24–37)

## 2011-01-07 NOTE — Assessment & Plan Note (Signed)
?   If chronic problem or something else like infection Seems most likely chronic cough worsening

## 2011-01-07 NOTE — Assessment & Plan Note (Signed)
Acute exacerbation - ? From diarrhea, does not look dehydrated ? Electrolyte or thyroid abnormality - labs checked

## 2011-01-07 NOTE — Telephone Encounter (Signed)
Message copied by Michele Mcalpine on Fri Jan 07, 2011  1:10 PM ------      Message from: Iva Boop      Created: Fri Jan 07, 2011 12:38 PM       Call the patient - she needs to go to [facility] ED for severe hyponatremia Dr. Wylene Simmer will end up admitting her later today but sodium is very low and needs IVF's, etc. We will follow along.      Fax the labs and last office notes with history and problem, med lists to the ED after you call patient and the triage/charge RN there.

## 2011-01-07 NOTE — Progress Notes (Signed)
Quick Note:  Call the patient - she needs to go to [facility] ED for severe hyponatremia Dr. Wylene Simmer will end up admitting her later today but sodium is very low and needs IVF's, etc. We will follow along. Fax the labs and last office notes with history and problem, med lists to the ED after you call patient and the triage/charge RN there.    ______

## 2011-01-07 NOTE — Telephone Encounter (Signed)
Spoke with pts husband and he is aware of Dr. Marvell Fuller recommendations. Triage nurse aware at [facility] ED and records faxed.

## 2011-01-08 ENCOUNTER — Inpatient Hospital Stay (HOSPITAL_COMMUNITY): Payer: Medicare Other

## 2011-01-08 DIAGNOSIS — A0472 Enterocolitis due to Clostridium difficile, not specified as recurrent: Secondary | ICD-10-CM

## 2011-01-08 DIAGNOSIS — K5732 Diverticulitis of large intestine without perforation or abscess without bleeding: Secondary | ICD-10-CM

## 2011-01-08 LAB — CBC
HCT: 27.4 % — ABNORMAL LOW (ref 36.0–46.0)
Hemoglobin: 9.3 g/dL — ABNORMAL LOW (ref 12.0–15.0)
MCV: 82.8 fL (ref 78.0–100.0)
WBC: 8.1 10*3/uL (ref 4.0–10.5)

## 2011-01-08 LAB — CARDIAC PANEL(CRET KIN+CKTOT+MB+TROPI)
CK, MB: 5.2 ng/mL — ABNORMAL HIGH (ref 0.3–4.0)
Relative Index: 4.2 — ABNORMAL HIGH (ref 0.0–2.5)
Total CK: 124 U/L (ref 7–177)
Troponin I: <0.3 ng/mL (ref <0.30)

## 2011-01-08 LAB — MRSA PCR SCREENING: MRSA by PCR: POSITIVE — AB

## 2011-01-08 LAB — BASIC METABOLIC PANEL
Calcium: 8.7 mg/dL (ref 8.4–10.5)
Creatinine, Ser: 0.67 mg/dL (ref 0.50–1.10)
GFR calc Af Amer: >60 mL/min (ref >60)
GFR calc non Af Amer: >60 mL/min (ref >60)

## 2011-01-08 LAB — PROTIME-INR: INR: 2.38 — ABNORMAL HIGH (ref 0.00–1.49)

## 2011-01-09 DIAGNOSIS — A0472 Enterocolitis due to Clostridium difficile, not specified as recurrent: Secondary | ICD-10-CM

## 2011-01-09 DIAGNOSIS — K5732 Diverticulitis of large intestine without perforation or abscess without bleeding: Secondary | ICD-10-CM

## 2011-01-09 LAB — CBC
MCV: 83.3 fL (ref 78.0–100.0)
Platelets: 257 10*3/uL (ref 150–400)
RDW: 14.2 % (ref 11.5–15.5)
WBC: 9.5 10*3/uL (ref 4.0–10.5)

## 2011-01-09 LAB — BASIC METABOLIC PANEL
Calcium: 9.1 mg/dL (ref 8.4–10.5)
GFR calc non Af Amer: >60 mL/min (ref >60)
Glucose, Bld: 86 mg/dL (ref 70–99)
Sodium: 128 mEq/L — ABNORMAL LOW (ref 135–145)

## 2011-01-09 LAB — PROTIME-INR
INR: 2.11 — ABNORMAL HIGH (ref 0.00–1.49)
Prothrombin Time: 24 seconds — ABNORMAL HIGH (ref 11.6–15.2)

## 2011-01-10 DIAGNOSIS — A0472 Enterocolitis due to Clostridium difficile, not specified as recurrent: Secondary | ICD-10-CM

## 2011-01-10 DIAGNOSIS — K5732 Diverticulitis of large intestine without perforation or abscess without bleeding: Secondary | ICD-10-CM

## 2011-01-10 LAB — PROTIME-INR: Prothrombin Time: 30 seconds — ABNORMAL HIGH (ref 11.6–15.2)

## 2011-01-10 LAB — BASIC METABOLIC PANEL
BUN: 7 mg/dL (ref 6–23)
Calcium: 9.3 mg/dL (ref 8.4–10.5)
Creatinine, Ser: 0.83 mg/dL (ref 0.50–1.10)
Creatinine, Ser: 0.93 mg/dL (ref 0.50–1.10)
GFR calc Af Amer: >60 mL/min (ref >60)
GFR calc Af Amer: >60 mL/min (ref >60)
GFR calc non Af Amer: >60 mL/min (ref >60)
Potassium: 3.9 mEq/L (ref 3.5–5.1)

## 2011-01-10 LAB — CBC
MCH: 28 pg (ref 26.0–34.0)
Platelets: 250 10*3/uL (ref 150–400)
RBC: 3.14 MIL/uL — ABNORMAL LOW (ref 3.87–5.11)

## 2011-01-10 LAB — CK: Total CK: 118 U/L (ref 7–177)

## 2011-01-10 NOTE — H&P (Signed)
NAME:  [name], [name]                  ACCOUNT NO.:  [id number]  MEDICAL RECORD NO.:  [id number]  LOCATION:  4707                         FACILITY:  [facility]  PHYSICIAN:  Gaspar Garbe, M.D.DATE OF BIRTH:  August 23, 1933  DATE OF ADMISSION:  01/07/2011 DATE OF DISCHARGE:                             HISTORY & PHYSICAL   CHIEF COMPLAINT:  Weakness and hyponatremia.  HISTORY OF PRESENT ILLNESS:  The patient is a 77 year old white female with multiple comorbidities who has been followed by Dr. Leone Payor in his office over the course of this past week.  She has a history of diverticulitis and earlier this week had a sodium of 129 and some weakness.  He saw her again with regards to lab work the past 24 hours her sodium had dropped to 122 when her home was called.  It was noted that the patient was having considerable weakness and failure to thrive at that point.  Dr. Leone Payor then called me as I was covering for Dr. Felipa Eth, her primary care physician today indicating her illness and indicating that he was not medically able to take care of her given his subspecialty status and her multiple comorbidities.  I indicated to him that since she was not in fact in the office that she would be sent to the emergency room for laboratory testing and stabilization and then I would admit the patient.  I came by the [facility] Emergency Room to see her and she looked to be in no distress at that time.  Laboratory testing showed a sodium of 119. She had IV fluids hung at that point and a PICC line placed due to her difficult to obtain peripheral access.  I have ordered some other tests in the ER as noted below and had a long discussion about the patient's current illness with her and her husband present.  She indicated that she has had 3-4 loose bowel movements minimum per day that they are mostly watery but have lessened some today.  She has had 8 glasses of water to drink yesterday per her husband's  instruction but has not eaten very much.  Her blood pressure had been low earlier this week and she had stopped her Atacand and her Lasix and is not currently taking them but has tried to take all of her medications.  She indicates that today is also the day for her Xolair injection which she receives every 4 weeks.  She indicates that she just generally does not feel well.  She has had no changes in her mental status per her husband and has her normal mental acuity with some short-term memory loss which she is receiving Aricept and issues with depression and anxiety.  ALLERGIES: 1. SULFA. 2. ASPIRIN. 3. IBUPROFEN. 4. IV IRON.  MEDICATIONS: 1. Xolair 150 mg injection every 4 weeks. 2. Meloxicam 15 mg once daily. 3. Synthroid 75 mcg once daily, alternating with 50 mcg once daily. 4. Accolate 20 mg tablets 1 tablet by mouth twice daily. 5. Coumadin 3 mg 1 tablet once daily. 6. Ranitidine 300 mg p.o. daily. 7. Cymbalta 30 mg p.o. t.i.d. 8. Zocor 10 mg p.o. at bedtime. 9. Prednisone 10  mg p.o. daily. 10.Atacand 16 mg p.o. daily. 11.Aricept 10 mg 1 tablet p.o. daily. 12.Ventolin HFA 2 puffs every 4-6 hours as needed. 13.Rhinocort Aqua 1 spray twice daily. 14.Multivitamin once daily. 15.Calcium plus vitamin D 1 tablet twice daily. 16.Flexeril 1 tablet p.o. at bedtime as needed. 17.Lunesta 3 mg p.o. at bedtime p.r.n. for sleep. 18.Nystatin 100,000 units per mL suspension 5 mL daily. 19.Trazodone 100 mg p.o. at bedtime as needed. 20.Nu-Iron 150 mg p.o. b.i.d. 21.QVAR 80 mcg per spray 2 puffs twice daily. 22.Meclizine 25 mg p.o. q.6 h p.r.n. for dizziness. 23.Vitamin D 50,000 units 1 capsule by mouth once weekly. 24.Furosemide 20 mg p.o. daily, currently held. 25.Protonix 40 mg p.o. daily. 26.Metoprolol 50 mg XR version 1 tablet by mouth daily. 27.Vancomycin 250 mg p.o. q.i.d. 28.Flagyl 500 mg p.o. t.i.d.  PAST MEDICAL HISTORY: 1. Heart failure, controlled. 2. Atrial  fibrillation. 3. Hypertension. 4. Hyperlipidemia. 5. Asthma, per Dr. Lucie Leather;  also is seen by Dr. Delton Coombes of Pulmonary. 6. Recurrent thrush. 7. Right hip arthritis. 8. Sleep disorder. 9. Breast cancer in 2004. 10.DJD and scoliosis. 11.Anemia. 12.Colon polyps. 13.History of pneumonia. 14.History of uterine fibroids. 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 16.History of MRSA, right toes. 17.History of sarcoma. 18.Hiatal hernia and GERD. 19.Insomnia. 20.Hypothyroidism.  PAST SURGICAL HISTORY: 1. The patient has pacemaker which is currently followed by Dr.     Ladona Ridgel. 2. Multiple sinus surgeries. 3. Appendectomy. 4. Hernia surgery. 5. Hysterectomy. 6. 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 7. Six eye surgeries including cataract surgery. 8. Left lumpectomy and sentinel lymph node biopsy and removal.  SOCIAL HISTORY:  The patient is married with 3 children.  She is a homemaker and is a former smoker and denies any alcohol and drug usage.  FAMILY HISTORY:  Mother had mucinous cecal tumor and had heart disease. Father had pancreatic cancer.  Paternal uncle with emphysema.  Family history of colon cancer and colon polyps.  REVIEW OF SYSTEMS:  She denies any fevers, chills, or sweats.  She denies any headache or sore throat, or upper respiratory symptoms. Denies cough.  Denies any heart palpitations or racing.  Indicates a left-sided abdominal pain with deep palpation.  Indicates diarrhea 3-4 loose bowel movements per day as noted.  She is not seeing any blood or mucus in it.  Indicates no swelling of her legs and continues to have arthritic changes.  Review of systems otherwise negative on 12-point scale.  ADVANCE DIRECTIVES:  The patient is a full code.  PHYSICAL EXAMINATION:  VITAL SIGNS:  Temperature 98.2, pulse 86, respiratory rate 18, blood pressure 126/82, and saturating 96% on 2 liters. GENERAL:  No acute distress. HEENT:  Normocephalic and atraumatic.   PERRLA.  EOMI.  ENT is within normal limits. NECK:  Supple.  No lymphadenopathy, JVD, or bruit. HEART:  Regular rate and rhythm.  EKG currently shows a paced rhythm. LUNGS:  Clear to auscultation bilaterally. ABDOMEN:  The patient has some left-sided tenderness without rebound or guarding which the patient indicates is improved from prior days. EXTREMITIES:  No clubbing, cyanosis, or edema. NEUROLOGIC:  The patient is oriented to person, place, and time and able to answer questions.  She is unable to currently stand due to weakness and has some poor grip strength.  She has no lateralizing signs. SKIN:  No new rashes or lesions noted.  Chest x-ray shows chronic bronchitis and presence of a pacer but no acuity.  LABORATORY DATA:  White count 10.6, hemoglobin 10.1, hematocrit 29.7,  and platelets 272.  BUN and creatinine are 8 and 0.8 respectively. Sodium is decreased to 117, potassium normal at 4.2.  She does not have an anion gap and her bicarbonate is normal at 23.  Liver function tests are within normal limits except for a slightly decreased albumin of 3.0. CK is 119, MB fraction is slightly elevated at 4.7.  Her troponin is negative at 0.3.  Urinalysis has not been done.  Pro time has not been done, although she indicates the pro time from earlier in the week was 3.4 and was instructed to hold her Coumadin today.  ASSESSMENT AND PLAN: 1. Hyponatremia.  She went from 122 to 117 and was not having any     neurologic signs.  Therefore, I feel it is safe to put her on IV     normal saline instead of hypertonic saline at this time.  Most of     her sodium depletion is probably related to gastrointestinal loss     and the loading of 8 glasses of water per day with very little     salt.  I will also check her for the possibility of syndrome of     inappropriate antidiuretic hormone and I have ordered a urine     sodium in the emergency room that should would be accurate as she     has  not been on diuretics for several days now.  If her urine     sodium is high, we will need to consider a workup for this, but we     will continue to follow her sodium again in the morning and make     adjustments as needed.  I have encouraged her to liberalize her     salt and she is willing eat pretzels and crackers with salt and to     decrease her oral fluid intake down to just enough to deal with her     thirst and her eating.  We may need nutrition see the patient as     well, but I would like her sodium heading in the right direction     before we work on her protein depletion which is obvious because of     her low albumin.  If SIADH is present, we may need to consider     removing her Cymbalta or ranitidine as these drugs have been linked     to this. 2. Atrial fibrillation.  Coumadin per protocol. 3. History of diverticulitis.  Dr. Leone Payor indicated that she had a     Pseudomonas infection in her urine.  I do not have any records of     those as they were not sent with the patient at all.  I will put     her on empiric Cipro and a urinalysis has been ordered in the     emergency room as well.  She is not having any urinary symptoms at     this time and we may be able to discontinue this. 4. Diverticulosis/diverticulitis. When I spoke with Dr. Leone Payor, he     indicated that he would be seeing the patient in consultation.  I     look forward to his input as he has been following this problem     over the past week and probably has more insight on it. 5. Hypertension.  We will continue her on her beta-blocker and ARB but     hold her diuretic. 6. Hyperlipidemia.  Continue her on Zocor. 7. Anxiety/memory deficits.  We will continue her Aricept and     Cymbalta. 8. Osteoarthritis.  Tylenol for pain as needed.  We will avoid NSAIDs     due to her intolerances of ibuprofen.  She has taken meloxicam, but     I believe that this would be a bad idea given her overall     cardiovascular  status at least until she has had some improvement     in her bowel movements as nonsteroidals can be linked to multiple     ulcerations. 9. Gastroesophageal reflux disease with hiatal hernia.  The patient is     on an H2 and proton pump inhibitor per her records and will let Dr.     Leone Payor adjust this if he sees fit to a single type of drug.  Might     need to consider removing the ranitidine if in fact she has SIADH. 10.Chronic obstructive pulmonary disease/asthma.  We will continue her     on her maintenance therapies as well as steroids.  She is to     receive Xolair today and I will order this per our inpatient     pharmacy as well.  DISPOSITION:  The patient is able to improve considerably.  She may be able to return home.  Her husband has worked in Art therapist for some time and has been providing care for her at home and that clearly is where she wants to be rather than a nursing facility.  We will have to see how she does with PT and OT evaluations over the coming days and once her medical situation improves.     Gaspar Garbe, M.D.     RWT/MEDQ  D:  01/07/2011  T:  01/08/2011  Job:  [id number]  cc:   Larina Earthly, M.D. Iva Boop, MD,FACG Leslye Peer, MD Doylene Canning. Ladona Ridgel, MD  Electronically Signed by Guerry Bruin M.D. on 01/10/2011 01:45:28 PM

## 2011-01-11 ENCOUNTER — Inpatient Hospital Stay (HOSPITAL_COMMUNITY): Payer: Medicare Other

## 2011-01-11 DIAGNOSIS — A0472 Enterocolitis due to Clostridium difficile, not specified as recurrent: Secondary | ICD-10-CM

## 2011-01-11 DIAGNOSIS — K5732 Diverticulitis of large intestine without perforation or abscess without bleeding: Secondary | ICD-10-CM

## 2011-01-11 LAB — BASIC METABOLIC PANEL
Calcium: 8.9 mg/dL (ref 8.4–10.5)
GFR calc Af Amer: >60 mL/min (ref >60)
GFR calc non Af Amer: >60 mL/min (ref >60)
Potassium: 4 mEq/L (ref 3.5–5.1)
Sodium: 134 mEq/L — ABNORMAL LOW (ref 135–145)

## 2011-01-11 LAB — PROTIME-INR
INR: 3.1 — ABNORMAL HIGH (ref 0.00–1.49)
Prothrombin Time: 32.4 seconds — ABNORMAL HIGH (ref 11.6–15.2)

## 2011-01-11 LAB — CBC
MCHC: 32 g/dL (ref 30.0–36.0)
RDW: 14.8 % (ref 11.5–15.5)

## 2011-01-11 LAB — PRO B NATRIURETIC PEPTIDE: Pro B Natriuretic peptide (BNP): 3218 pg/mL — ABNORMAL HIGH (ref 0–125)

## 2011-01-12 ENCOUNTER — Inpatient Hospital Stay (HOSPITAL_COMMUNITY): Payer: Medicare Other

## 2011-01-12 LAB — BASIC METABOLIC PANEL
Calcium: 9 mg/dL (ref 8.4–10.5)
GFR calc Af Amer: >60 mL/min (ref >60)
GFR calc non Af Amer: >60 mL/min (ref >60)
Glucose, Bld: 75 mg/dL (ref 70–99)
Sodium: 133 mEq/L — ABNORMAL LOW (ref 135–145)

## 2011-01-12 LAB — CBC
Platelets: 254 10*3/uL (ref 150–400)
RDW: 14.6 % (ref 11.5–15.5)
WBC: 10.1 10*3/uL (ref 4.0–10.5)

## 2011-01-12 LAB — PROTIME-INR
INR: 2.91 — ABNORMAL HIGH (ref 0.00–1.49)
Prothrombin Time: 30.9 seconds — ABNORMAL HIGH (ref 11.6–15.2)

## 2011-01-12 LAB — OCCULT BLOOD X 1 CARD TO LAB, STOOL: Fecal Occult Bld: NEGATIVE

## 2011-01-13 LAB — CBC
MCV: 85.5 fL (ref 78.0–100.0)
Platelets: 261 10*3/uL (ref 150–400)
RDW: 14.8 % (ref 11.5–15.5)
WBC: 7.2 10*3/uL (ref 4.0–10.5)

## 2011-01-13 LAB — BASIC METABOLIC PANEL
CO2: 28 mEq/L (ref 19–32)
Calcium: 9.3 mg/dL (ref 8.4–10.5)
Glucose, Bld: 92 mg/dL (ref 70–99)
Sodium: 135 mEq/L (ref 135–145)

## 2011-01-13 LAB — RETICULOCYTES: Retic Count, Absolute: 50.5 10*3/uL (ref 19.0–186.0)

## 2011-01-13 LAB — IRON AND TIBC
Iron: 10 ug/dL — ABNORMAL LOW (ref 42–135)
TIBC: 216 ug/dL — ABNORMAL LOW (ref 250–470)
UIBC: 206 ug/dL

## 2011-01-13 LAB — FERRITIN: Ferritin: 24 ng/mL (ref 10–291)

## 2011-01-14 LAB — CBC
MCH: 27.4 pg (ref 26.0–34.0)
MCHC: 32.2 g/dL (ref 30.0–36.0)
MCV: 84.9 fL (ref 78.0–100.0)
Platelets: 270 10*3/uL (ref 150–400)
RDW: 14.6 % (ref 11.5–15.5)
WBC: 7.4 10*3/uL (ref 4.0–10.5)

## 2011-01-14 LAB — BASIC METABOLIC PANEL
Calcium: 9.8 mg/dL (ref 8.4–10.5)
Creatinine, Ser: 0.91 mg/dL (ref 0.50–1.10)
GFR calc Af Amer: >60 mL/min (ref >60)
GFR calc non Af Amer: >60 mL/min (ref >60)

## 2011-01-14 LAB — PRO B NATRIURETIC PEPTIDE: Pro B Natriuretic peptide (BNP): 3733 pg/mL — ABNORMAL HIGH (ref 0–125)

## 2011-01-17 ENCOUNTER — Ambulatory Visit (INDEPENDENT_AMBULATORY_CARE_PROVIDER_SITE_OTHER): Payer: Self-pay | Admitting: Cardiology

## 2011-01-17 ENCOUNTER — Encounter: Payer: Medicare Other | Admitting: *Deleted

## 2011-01-17 DIAGNOSIS — R0989 Other specified symptoms and signs involving the circulatory and respiratory systems: Secondary | ICD-10-CM

## 2011-01-17 NOTE — Discharge Summary (Signed)
NAMELADONNA, [name]                  ACCOUNT NO.:  [id number]  MEDICAL RECORD NO.:  [id number]  LOCATION:  4712                         FACILITY:  [facility]  PHYSICIAN:  Larina Earthly, M.D.        DATE OF BIRTH:  April 14, 1934  DATE OF ADMISSION:  01/07/2011 DATE OF DISCHARGE:  01/14/2011                              DISCHARGE SUMMARY   LOCATION:  4712, [facility].  DISCHARGE DIAGNOSES: 1. Hyponatremia, significant, presumably secondary to GI losses and     excessive water intake. 2. Systolic congestive heart failure with no evidence pulmonary edema,     but associated with increased BNP, to be reinitiated on low-dose     diuretics. 3. Asthma, treated adequately with bronchodilators. 4. Question GI bleed with iron deficiency anemia versus anemia of     chronic disease, stable on proton pump inhibitor, to be followed on     an outpatient basis by Dr. Stan Head, with multiple GI     procedures in the past. 5. History of recent diverticular abscess versus diverticulitis,     complicated by Clostridium difficile, status post multiple     antibiotic regimens, per Dr. Stan Head, now asymptomatic with 2     formed bowel movements on the day of discharge with normal white     blood cell count and the patient afebrile. 6. Methicillin-resistant staphylococcus aureus colonization, status     post partial tremens during hospitalization. 7. Anemia, presumably chronic disease versus iron deficiency,     currently on iron supplementation and possibly in need of     intravenous iron formulations. 8. Hypokalemia, repleted. 9. Physical deconditioning, to be discharged with physical and     occupational therapy as well as home health RN. 10.Dementia, mild with stable.  SECONDARY DIAGNOSES: 1. Multiple medication allergies consisting of SULFA, ASPIRIN,     IBUPROFEN, and IV IRON. 2. Polypharmacy. 3. Atrial fibrillation, on anticoagulation. 4. Hypertension. 5. Hyperlipidemia. 6.  Asthma. 7. Recurrent thrush 8. Right hip arthritis. 9. Sleep disorder. 10.Breast cancer 2004. 11.Arthritis and scoliosis. 12.Colon polyps. 13.History of pneumonia. 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 16.History of methicillin-resistant staphylococcus aureus right toes. 17.History of sarcoma. 18.Hiatal hernia with gastroesophageal reflux disease. 19.Insomnia. 20.Hypothyroidism. 21.Pacemaker implantation, followed by Dr. Ladona Ridgel. 22.Multiple sinus surgeries. 23.Appendectomy. 24.Hernia surgery. 25.Hysterectomy. 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 27.Six eye surgeries including cataract surgery. 28.History of left lumpectomy and sentinel node lymph node biopsy and     removal.  DISCHARGE MEDICATIONS: 1. Cymbalta 30 mg t.i.d. 2. Iron complex 150 mg twice daily. 3. Atacand 60 mg daily. 4. Prednisone 10 mg daily. 5. Accolate 20 mg twice daily. 6. Aricept 10 mg p.o. q.p.m. 7. Flexeril 10 mg q.8 p.r.n. 8. Lasix 20 mg daily. 9. Vicodin 1 every 6 hours as needed for pain. 10.Hyoscyamine 0.125 mg three times a day as needed for cramps. 11.Loratadine 10 mg daily. 12.Lunesta 3 mg daily. 13.Metoprolol succinate extended release 50 mg daily. 14.Nystatin oral suspension 1 teaspoon by mouth t.i.d. p.r.n. 15.Protonix 40 mg daily. 16.Albuterol inhaler 2 puffs every 4 hours as needed.  17.Albuterol nebulizers every 6 hours as needed. 18.QVAR 40 mcg 2 puffs twice daily. 19.Ranitidine 150 mg 2 tablets p.o. nightly. 20.Rhinocort Aqua nasal spray, 1 spray in each naris daily. 21.Simvastatin 10 mg daily. 22.Synthroid 50 mcg alternating with 75 mcg every other day. 23.Trazodone 100 mg p.o. nightly. 24.Tussionex extended release oral suspension 1 teaspoon at bedtime as     needed. 25.Vitamin D 50,000 international units weekly. 26.Warfarin 3 mg daily. 27.Xolair 150 mg one injection every 28  days. 28.Zofran 4 mg every 6 hours as needed for nausea or vomiting.  The patient instructed to be discharged home after PICC line removed, but to be set up with home health at [facility] with physical and occupational therapy as well as Charity fundraiser.  The patient see Dr. Felipa Eth and Dr. Leone Payor within 1-2 weeks.  At that time, she will need CBC, renal parameters, electrolytes measured, PT/INR and reevaluation for volume overload.  DISCHARGE LABS:  White blood cell count 7.4, hemoglobin 8.9, hematocrit 27.6%, platelet count 270.  Sodium 136, potassium 3.8, BUN 12, creatinine 0.91, serum CO2 31, glucose 83, calcium normal.  BNP 3733. FOBT x2 negative.  Reticulocyte count normal at 1.7, iron low at 10, TIBC low at 216.  Please note that admission white count was 7.6, hemoglobin 10.1, BUN 8, creatinine 0.8.  Sodium 117, potassium 4.2. LFTs normal.  Cardiac enzymes normal.  PT/INR 3.4.  RADIOLOGY EVALUATION:  Chest x-ray from January 12, 2011, revealed bibasilar subsegmental atelectasis with low-volume chest with no evidence for pulmonary edema.  Left femur x-ray unremarkable.  HISTORY AND PHYSICAL:  Please see extensive dictation by Dr. Guerry Bruin on January 07, 2011, for details, but in summary this is a 77 year old Caucasian female with recent GI illness including diverticular abscess/diverticulitis and C. difficile colitis who had significant amounts of diarrhea with increased water intake associated with significant hyponatremia with very little salt intake.  She presented without any focal neurological signs of significant fatigue and generalized weakness and disability.  She was admitted and given normal saline, and set of hypertonic saline given the lack of any neurologic symptoms with multiple etiologies entertained including recent GI losses versus the use of ranitidine Cymbalta and/or SIADH.  Hemodynamically she remained stable and again there was a question of whether she  had Pseudomonas in her urine and she was empirically started on Cipro in addition to her GI antibiotics consisting of Flagyl and vancomycin for her recent C-difficile colitis as well as diverticulitis.  The patient was initially evaluated by Dr. Leone Payor prior to hospitalization.  HOSPITAL COURSE:  The patient was stabilized with slowly resolving sodium in the setting of saline infusion.  Loose bowel movement slowly resolved.  GI did continue to follow, and 2-3 days prior to discharge, she no longer was having significant a loose stools and was having formed stools.  She remained afebrile.  White blood cell count remained normal.  INR remained therapeutic for her anticoagulation in the setting of atrial fibrillation.  She had reasonable pain control.  She did develop significant sudden-onset left femur pain with normal CK and unremarkable x-ray, thought to be possibly related to muscle spasm. Given her significant physical deconditioning, Physical and Occupational Therapy did work with the patient in the setting for mild dementia and did feel that she needed extensive further management on an outpatient basis easily performed by home health.  Her husband is a retired Development worker, community and is incapable of seeing her care to some extent.  All antibiotics were discontinued  on January 12, 2011, by Dr. Leone Payor with resolution of her diverticulitis as well as C. diff.  Please note that the vancomycin was probably administered prophylactically given her history of C. diff.  Her course was complicated by increasing extremity edema with elevated BNP, but no evidence of pulmonary edema.  She was administered 2 doses of Lasix prior to discharge with subsequent mild diuresis and this will continue on an outpatient basis.  We did caution the patient on excessive use of diuretics resulting in recurrent hyponatremia.  The patient and husband were in agreement and will be monitored closely on an outpatient  basis.     Larina Earthly, M.D.     RA/MEDQ  D:  01/14/2011  T:  01/15/2011  Job:  [id number]  Electronically Signed by Larina Earthly M.D. on 01/17/2011 07:03:37 PM

## 2011-01-18 ENCOUNTER — Telehealth: Payer: Self-pay | Admitting: Internal Medicine

## 2011-01-18 NOTE — Telephone Encounter (Signed)
Post hospital for 01/28/11 10:45.  Patient scheduled with her husband

## 2011-01-25 ENCOUNTER — Ambulatory Visit (INDEPENDENT_AMBULATORY_CARE_PROVIDER_SITE_OTHER): Payer: Self-pay | Admitting: *Deleted

## 2011-01-25 DIAGNOSIS — R0989 Other specified symptoms and signs involving the circulatory and respiratory systems: Secondary | ICD-10-CM

## 2011-01-28 ENCOUNTER — Ambulatory Visit (INDEPENDENT_AMBULATORY_CARE_PROVIDER_SITE_OTHER): Payer: Medicare Other | Admitting: Internal Medicine

## 2011-01-28 ENCOUNTER — Encounter: Payer: Self-pay | Admitting: Internal Medicine

## 2011-01-28 VITALS — BP 138/62 | HR 100 | Ht 65.0 in | Wt 170.0 lb

## 2011-01-28 DIAGNOSIS — C50919 Malignant neoplasm of unspecified site of unspecified female breast: Secondary | ICD-10-CM

## 2011-01-28 DIAGNOSIS — A0472 Enterocolitis due to Clostridium difficile, not specified as recurrent: Secondary | ICD-10-CM

## 2011-01-28 DIAGNOSIS — D500 Iron deficiency anemia secondary to blood loss (chronic): Secondary | ICD-10-CM

## 2011-01-28 DIAGNOSIS — K5732 Diverticulitis of large intestine without perforation or abscess without bleeding: Secondary | ICD-10-CM

## 2011-01-28 DIAGNOSIS — C801 Malignant (primary) neoplasm, unspecified: Secondary | ICD-10-CM

## 2011-01-28 DIAGNOSIS — F0390 Unspecified dementia without behavioral disturbance: Secondary | ICD-10-CM

## 2011-01-28 NOTE — Assessment & Plan Note (Signed)
Still waiting to resolve and wrap up the diverticulitis and C. difficile problems, though they appear to be. Then refer her for intravenous iron therapy.

## 2011-01-28 NOTE — Assessment & Plan Note (Signed)
She is still tender in the left lower quadrant but feels better and interestingly a sweat she was having in the middle of the day for months have resolved. Question if that could be related to the abscess. At this point she needs a CT scan to see if this has resolved. Further plans pending that. She is obviously a very poor surgical candidate.

## 2011-01-28 NOTE — Patient Instructions (Addendum)
You have been scheduled for a CT scan of the abdomen and pelvis at [facility] ([address]---this is in the same building as [facility]ural [facility]).   You are scheduled on  Wed 02/02/11 at 2:00 pm. You should arrive 15 minutes prior to your appointment time for registration. Please follow the written instructions below on the day of your exam:  1) Do not eat or drink anything after 10:00 am (4 hours prior to your test) 2) You have been given 2 bottles of oral contrast to drink. The solution may taste better if refrigerated, but do NOT add ice or any other liquid to this solution. Shake well before drinking.  Drink 1 bottle of contrast @ 12:00 pm (2 hours prior to your exam)  Drink 1 bottle of contrast @ 1:00 pm (1 hour prior to your exam)  You may take any medications as prescribed with a small amount of water except for the following: Metformin, Glucophage, Glucovance, Avandamet, Riomet, Fortamet, Actoplus Met, Janumet, Glumetza or Metaglip. The above medications must be held the day of the exam and 48 hours after the exam.  The purpose of you drinking the oral contrast is to aid in the visualization of your intestinal tract. The contrast solution may cause some diarrhea. Before your exam is started, you will be given a small amount of fluid to drink. Depending on your individual set of symptoms, you may also receive an intravenous injection of x-ray contrast/dye.  If you have any questions regarding your exam or if you need to reschedule, you may call the CT department at [phone] between the hours of 8:00 am and 5:00 pm, Monday-Friday.  ________________________________________________________________________

## 2011-01-28 NOTE — Assessment & Plan Note (Signed)
Clinically resolved, has completed a long course of vancomycin taken along with her Cipro and metronidazole which were required for her diverticulitis. Should she need other antibiotics for diverticulitis would need to consider coverage again.

## 2011-01-28 NOTE — Progress Notes (Signed)
  Subjective:    Patient ID: [name], female    DOB: 07/20/1933, 77 y.o.   MRN: [id number]  HPI Returns with her husband for followup of C. difficile colitis and diverticulitis with abscess, sigmoid region. She was hospitalized with profound hyponatremia during the outpatient treatment of this. She has finished her antibiotics that include Cipro, metronidazole and vancomycin. She does not have diarrhea and denies abdominal pain. Interestingly she was having months of sweats in the mid afternoon that it disappeared.   Review of Systems Describes swelling of the left thigh and leg which was in the hospital but nobody could    Objective:   Physical Exam [name] no acute distress, mildly chronically The abdomen is soft, she remains tender in the left upper quadrant to deep palpation as before but there is no guarding or rebound       Assessment & Plan:  Assessment diverticulitis and abscess of the CT scan and determine the next step. I am hoping this abscess is resolved.

## 2011-02-02 ENCOUNTER — Ambulatory Visit (INDEPENDENT_AMBULATORY_CARE_PROVIDER_SITE_OTHER)
Admission: RE | Admit: 2011-02-02 | Discharge: 2011-02-02 | Disposition: A | Discharge status: Home / Self Care | Payer: Medicare Other | Source: Ambulatory Visit | Attending: Internal Medicine | Admitting: Internal Medicine

## 2011-02-02 DIAGNOSIS — K5732 Diverticulitis of large intestine without perforation or abscess without bleeding: Secondary | ICD-10-CM

## 2011-02-02 MED ORDER — IOHEXOL 300 MG/ML  SOLN
80.0000 mL | Freq: Once | INTRAMUSCULAR | Status: AC | PRN
  Administered 2011-02-02: 80 mL via INTRAVENOUS

## 2011-02-04 ENCOUNTER — Other Ambulatory Visit: Payer: Self-pay

## 2011-02-04 DIAGNOSIS — D509 Iron deficiency anemia, unspecified: Secondary | ICD-10-CM

## 2011-02-04 DIAGNOSIS — D62 Acute posthemorrhagic anemia: Secondary | ICD-10-CM

## 2011-02-04 NOTE — Progress Notes (Signed)
Quick Note:  Let her know diverticuliitis and abscess are resolved as is C diff Needs referral to hematologist to evaluate and treat chronic anemia due to low-grade blood loss and iron-def ______

## 2011-02-07 ENCOUNTER — Encounter: Payer: Medicare Other | Admitting: *Deleted

## 2011-02-07 ENCOUNTER — Ambulatory Visit (INDEPENDENT_AMBULATORY_CARE_PROVIDER_SITE_OTHER): Payer: Medicare Other | Admitting: *Deleted

## 2011-02-07 DIAGNOSIS — I4891 Unspecified atrial fibrillation: Secondary | ICD-10-CM

## 2011-02-08 NOTE — Telephone Encounter (Signed)
See contact section of phone note

## 2011-02-14 ENCOUNTER — Telehealth: Payer: Self-pay | Admitting: Internal Medicine

## 2011-02-14 NOTE — Telephone Encounter (Signed)
Got letter and will have to address with Dr Ladona Ridgel when he comes back on 02/23/11  Spoke with pt  They are wanting to go out of town and ride in a car She is requesting that it be approved by one of Dr Lubertha Basque partners  I told her that I would have to ask and see what I could find out  She says Dr Tomasa Blase this last 08/2009

## 2011-02-14 NOTE — Telephone Encounter (Signed)
Dr Johney Frame signed form for me and I faxed over

## 2011-02-14 NOTE — Telephone Encounter (Signed)
Pt calling to check on status of fax sent by dr newton to ok her back shot, pls call

## 2011-02-23 ENCOUNTER — Encounter: Payer: Medicare Other | Admitting: *Deleted

## 2011-02-24 ENCOUNTER — Ambulatory Visit (INDEPENDENT_AMBULATORY_CARE_PROVIDER_SITE_OTHER): Payer: Medicare Other | Admitting: *Deleted

## 2011-02-24 DIAGNOSIS — I4891 Unspecified atrial fibrillation: Secondary | ICD-10-CM

## 2011-02-24 LAB — POCT INR: INR: 1.1

## 2011-03-02 ENCOUNTER — Other Ambulatory Visit: Payer: Self-pay | Admitting: Oncology

## 2011-03-02 ENCOUNTER — Encounter (HOSPITAL_BASED_OUTPATIENT_CLINIC_OR_DEPARTMENT_OTHER): Payer: Medicare Other | Admitting: Oncology

## 2011-03-02 DIAGNOSIS — Z170 Estrogen receptor positive status [ER+]: Secondary | ICD-10-CM

## 2011-03-02 DIAGNOSIS — D649 Anemia, unspecified: Secondary | ICD-10-CM

## 2011-03-02 DIAGNOSIS — C4910 Malignant neoplasm of connective and soft tissue of unspecified upper limb, including shoulder: Secondary | ICD-10-CM

## 2011-03-02 DIAGNOSIS — C50219 Malignant neoplasm of upper-inner quadrant of unspecified female breast: Secondary | ICD-10-CM

## 2011-03-02 LAB — CBC WITH DIFFERENTIAL/PLATELET
BASO%: 0 % (ref 0.0–2.0)
LYMPH%: 6.5 % — ABNORMAL LOW (ref 14.0–49.7)
MCHC: 33.1 g/dL (ref 31.5–36.0)
MONO#: 1.2 10*3/uL — ABNORMAL HIGH (ref 0.1–0.9)
Platelets: 330 10*3/uL (ref 145–400)
RBC: 3.73 10*6/uL (ref 3.70–5.45)
WBC: 13 10*3/uL — ABNORMAL HIGH (ref 3.9–10.3)

## 2011-03-02 LAB — MORPHOLOGY: PLT EST: ADEQUATE

## 2011-03-02 LAB — COMPREHENSIVE METABOLIC PANEL
ALT: 14 U/L (ref 0–35)
AST: 14 U/L (ref 0–37)
Albumin: 3.4 g/dL — ABNORMAL LOW (ref 3.5–5.2)
BUN: 35 mg/dL — ABNORMAL HIGH (ref 6–23)
Calcium: 9.5 mg/dL (ref 8.4–10.5)
Chloride: 99 mEq/L (ref 96–112)
Potassium: 4.6 mEq/L (ref 3.5–5.3)

## 2011-03-02 LAB — CHCC SMEAR

## 2011-03-03 ENCOUNTER — Other Ambulatory Visit: Payer: Self-pay | Admitting: Oncology

## 2011-03-03 DIAGNOSIS — Z1231 Encounter for screening mammogram for malignant neoplasm of breast: Secondary | ICD-10-CM

## 2011-03-04 ENCOUNTER — Other Ambulatory Visit: Payer: Self-pay | Admitting: Oncology

## 2011-03-04 LAB — IRON AND TIBC
TIBC: 387 ug/dL (ref 250–470)
UIBC: 346 ug/dL (ref 125–400)

## 2011-03-07 LAB — PROTEIN ELECTROPHORESIS, SERUM
Alpha-2-Globulin: 12.5 % — ABNORMAL HIGH (ref 7.1–11.8)
Beta 2: 3.9 % (ref 3.2–6.5)
Beta Globulin: 7.3 % — ABNORMAL HIGH (ref 4.7–7.2)
Gamma Globulin: 12.3 % (ref 11.1–18.8)
Total Protein, Serum Electrophoresis: 6.7 g/dL (ref 6.0–8.3)

## 2011-03-08 ENCOUNTER — Ambulatory Visit (INDEPENDENT_AMBULATORY_CARE_PROVIDER_SITE_OTHER): Payer: Medicare Other | Admitting: Internal Medicine

## 2011-03-08 ENCOUNTER — Encounter: Payer: Self-pay | Admitting: Internal Medicine

## 2011-03-08 DIAGNOSIS — I509 Heart failure, unspecified: Secondary | ICD-10-CM

## 2011-03-08 DIAGNOSIS — I4891 Unspecified atrial fibrillation: Secondary | ICD-10-CM

## 2011-03-08 DIAGNOSIS — Z950 Presence of cardiac pacemaker: Secondary | ICD-10-CM

## 2011-03-08 LAB — PACEMAKER DEVICE OBSERVATION
AL IMPEDENCE PM: 518 Ohm
ATRIAL PACING PM: 6
RV LEAD IMPEDENCE PM: 606 Ohm
VENTRICULAR PACING PM: 99

## 2011-03-08 NOTE — Assessment & Plan Note (Signed)
She is out of rhythm less than 0.1% of the time. Will follow.

## 2011-03-08 NOTE — Patient Instructions (Signed)
Your physician wants you to follow-up in: 12 months with Dr Court Joy will receive a reminder letter in the mail two months in advance. If you don't receive a letter, please call our office to schedule the follow-up    Remote monitoring is used to monitor your Pacemaker of ICD from home. This monitoring reduces the number of office visits required to check your device to one time per year. It allows [place] to keep an eye on the functioning of your device to ensure it is working properly. You are scheduled for a device check from home on 06/09/2011. You may send your transmission at any time that day. If you have a wireless device, the transmission will be sent automatically. After your physician reviews your transmission, you will receive a postcard with your next transmission date.

## 2011-03-08 NOTE — Assessment & Plan Note (Signed)
Her device is working normally. Will recheck in several months. 

## 2011-03-08 NOTE — Progress Notes (Signed)
HPI Mr. [name] returns today for followup. She is a pleasant 77 yo woman with a h/o CHB, s/p PPM, HTN, and chronic asthma. She has been bothered by C.Diff colitis and diverticulitis. She denies syncope. She underwent a long period of IV therapy with antibiotics. No recurrent fever. Her diarrhea has resolved. No significant palpitations. Allergies  Allergen Reactions  . Aspirin   . Ibuprofen   . Iron Dextran     REACTION: itching after test dose  . Metoclopramide Hcl     REACTION: chomping mouth  . Sulfonamide Derivatives      Current Outpatient Prescriptions  Medication Sig Dispense Refill  . albuterol (PROVENTIL HFA) 108 (90 BASE) MCG/ACT inhaler Inhale 2 puffs into the lungs. As directed       . albuterol (PROVENTIL) (2.5 MG/3ML) 0.083% nebulizer solution Take 2.5 mg by nebulization. As needed       . beclomethasone (QVAR) 40 MCG/ACT inhaler 2 puffs 2 (two) times daily. Nostril      . budesonide (RHINOCORT AQUA) 32 MCG/ACT nasal spray Place 1 spray into the nose daily. Inhale 1 spray in each nostril once daily       . candesartan (ATACAND) 16 MG tablet Take 16 mg by mouth daily.        . chlorpheniramine-HYDROcodone (TUSSIONEX PENNKINETIC ER) 10-8 MG/5ML LQCR Take 5 mLs by mouth. As needed       . Cholecalciferol (VITAMIN D3) 50000 UNITS CAPS Take 1 tablet by mouth once a week.        . cyclobenzaprine (FLEXERIL) 10 MG tablet Take 10 mg by mouth daily. Take 1 AS NEEDED      . docusate sodium (COLACE) 100 MG capsule Take 100 mg by mouth as needed. As directed      . donepezil (ARICEPT) 10 MG tablet Take 10 mg by mouth at bedtime as needed. Take 1 daily       . DULoxetine (CYMBALTA) 30 MG capsule Take 30 mg by mouth daily. Take 1 tab daily      . EPINEPHrine (EPIPEN) 0.3 mg/0.3 mL DEVI Inject 0.3 mg into the muscle once. As needed basis       . Eszopiclone (ESZOPICLONE) 3 MG TABS Take by mouth at bedtime. Take immediately before bedtime      . furosemide (LASIX) 20 MG tablet Take 20 mg by  mouth as needed. 1 tab      . hydrocodone-acetaminophen (LORCET-HD) 5-500 MG per capsule Take 1 capsule by mouth. As needed       . levothyroxine (SYNTHROID) 75 MCG tablet Take 1 every other day alternating with Synthroid 50 mcg.      . levothyroxine (SYNTHROID, LEVOTHROID) 50 MCG tablet Take 1 tab every other day.      . loratadine (CLARITIN) 10 MG tablet TAKE 1 TABLET (10 MG TOTAL) BY MOUTH DAILY.  30 tablet  5  . meloxicam (MOBIC) 15 MG tablet Take 15 mg by mouth daily.        . metoprolol (TOPROL-XL) 50 MG 24 hr tablet Take 50 mg by mouth daily.        [name] nystatin (MYCOSTATIN) 100000 UNIT/ML suspension Take 500,000 Units by mouth at bedtime.  UP TO 3 TSP A DAY AS NEEDED FOR THRUSH      . omalizumab (XOLAIR) 150 MG injection Inject into the skin every 28 (twenty-eight) days. Both arms      . ondansetron (ZOFRAN) 4 MG tablet Take 1 tablet (4 mg total) by mouth every 6 (  six) hours.  40 tablet  0  . pantoprazole (PROTONIX) 40 MG tablet Take 40 mg by mouth daily. 30-60 min before breakfast       . predniSONE (DELTASONE) 5 MG tablet Take 5 mg by mouth daily. Take 1 1/2  tab      . Ranitidine HCl 150 MG PACK Take by mouth. Take 2 tab at bedtime       . simvastatin (ZOCOR) 10 MG tablet Take 10 mg by mouth at bedtime.        . traZODone (DESYREL) 100 MG tablet Take 100 mg by mouth at bedtime.        . vitamin C (ASCORBIC ACID) 500 MG tablet Take 500 mg by mouth daily.        [name] warfarin (COUMADIN) 3 MG tablet Take by mouth as directed.        . zafirlukast (ACCOLATE) 20 MG tablet Take 20 mg by mouth 2 (two) times daily.           Past Medical History  Diagnosis Date  . Sarcoma     right hand  . Insomnia, unspecified   . Osteoarthrosis, unspecified whether generalized or localized, unspecified site   . Unspecified hypothyroidism   . Unspecified essential hypertension   . Atrial fibrillation   . Sciatica   . Malignant neoplasm of breast (female), unspecified site   . Anemia   . COPD (chronic  obstructive pulmonary disease)   . UTI (urinary tract infection)   . GERD (gastroesophageal reflux disease)   . Pneumonia   . Asthma   . Clostridium difficile colitis   . Diverticulitis of sigmoid colon 11/2010    with abscess/contained perforation  . Dementia     mild  . Dehydration with hyponatremia 12/2010    hospitalized  . Angiodysplasia of stomach     ablated 2010  . Angiodysplasia of colon     ablated 2010    ROS:   All systems reviewed and negative except as noted in the HPI.   Past Surgical History  Procedure Date  . Nasal sinus surgery     x 3  . Appendectomy   . Hernia repair   . Abdominal hysterectomy   . Pacemaker insertion   . Eye surgery     x6-bilateral  . Finger amputation     right  . Foot surgery     right toes x 3  . Tonsillectomy   . Colonoscopy 01/2009    w/ablation of 3 mm AVM, diverticulosis, external hemorrhoids  . Upper gastrointestinal endoscopy 01/2009    w/polypectomy, AVM, hyperplastic polyp     Family History  Problem Relation Age of Onset  . Emphysema Paternal Uncle   . Heart disease Mother   . Colon cancer Mother   . Pancreatic cancer Father   . Tuberculosis Paternal Uncle   . Breast cancer Maternal Aunt   . Breast cancer Cousin   . Prostate cancer Cousin   . Colon cancer Cousin   . Uterine cancer Cousin      History   Social History  . Marital Status: Married    Spouse Name: N/A    Number of Children: 3  . Years of Education: N/A   Occupational History  . Home Maker    Social History Main Topics  . Smoking status: Former Smoker -- 0.5 packs/day for 21 years    Quit date: 06/20/1977  . Smokeless tobacco: Never Used  . Alcohol Use: Yes  1 glass wine/night  . Drug Use: No  . Sexually Active: Not on file   Other Topics Concern  . Not on file   Social History Narrative  . No narrative on file     BP 138/75  Pulse 103  Ht 5' 3.5" (1.613 m)  Wt 173 lb 12.8 oz (78.835 kg)  BMI 30.30 kg/m2  Physical  Exam:  Well appearing NAD HEENT: Unremarkable Neck:  No JVD, no thyromegally Lymphatics:  No adenopathy Back:  No CVA tenderness Lungs:  Clear with no wheezes, rales, or rhonchi. Her heart sounds are decreased. HEART:  Regular rate rhythm, no murmurs, no rubs, no clicks Abd:  soft, positive bowel sounds, no organomegally, no rebound, no guarding Ext:  2 plus pulses, no edema, no cyanosis, no clubbing Skin:  No rashes no nodules Neuro:  CN II through XII intact, motor grossly intact  DEVICE  Normal device function.  See PaceArt for details.   Assess/Plan:

## 2011-03-08 NOTE — Assessment & Plan Note (Signed)
Her symptoms appear to be well controlled. She will maintain a low sodium diet. She will followup her H/H.

## 2011-03-10 ENCOUNTER — Encounter: Payer: Medicare Other | Admitting: *Deleted

## 2011-03-11 ENCOUNTER — Encounter (HOSPITAL_BASED_OUTPATIENT_CLINIC_OR_DEPARTMENT_OTHER): Payer: Medicare Other | Admitting: Oncology

## 2011-03-11 DIAGNOSIS — D509 Iron deficiency anemia, unspecified: Secondary | ICD-10-CM

## 2011-03-16 ENCOUNTER — Telehealth: Payer: Self-pay | Admitting: Internal Medicine

## 2011-03-16 NOTE — Telephone Encounter (Signed)
Patient reports a continued LLQ tenrderness.  She says Dr Gaylyn Rong examined her last week and she had a lot pain during that exam.  She says she has a "cramp" at times in the LLQ.  She is not having fever, constipation, diarrhea  Or rectal bleeding.   She will be out of town next week she asks that I try and reach her on her  Cell number [phone].  She is advised that Dr Leone Payor is out of the office until next Monday.

## 2011-03-17 ENCOUNTER — Ambulatory Visit
Admission: RE | Admit: 2011-03-17 | Discharge: 2011-03-17 | Disposition: A | Discharge status: Home / Self Care | Payer: Medicare Other | Source: Ambulatory Visit | Attending: Oncology | Admitting: Oncology

## 2011-03-17 ENCOUNTER — Ambulatory Visit (INDEPENDENT_AMBULATORY_CARE_PROVIDER_SITE_OTHER): Payer: Medicare Other | Admitting: *Deleted

## 2011-03-17 ENCOUNTER — Ambulatory Visit: Payer: Medicare Other | Admitting: Physician Assistant

## 2011-03-17 DIAGNOSIS — I4891 Unspecified atrial fibrillation: Secondary | ICD-10-CM

## 2011-03-17 DIAGNOSIS — Z1231 Encounter for screening mammogram for malignant neoplasm of breast: Secondary | ICD-10-CM

## 2011-03-17 NOTE — Telephone Encounter (Signed)
Patient will come in today and see Amy Esterwood PA at 1:30

## 2011-03-17 NOTE — Telephone Encounter (Signed)
It is quite possible she has recurrent diverticulitis though it was resolved on the CT scan.  It would best for her to be seen in the office today or tomorrow.  She has a chronic tenderness in the LLQ also and did when I saw her in August.  I recommend a repeat course of antibiotics and would at least put her on Florastor.  If she cannot be seen I think cipro 500 mg bid and metronidazole 500 mg tid with vancomycin 125 mg tid all x 10 days makes sense based upon her history and risk profile - she would need close follow-up of INR also

## 2011-03-18 ENCOUNTER — Ambulatory Visit (INDEPENDENT_AMBULATORY_CARE_PROVIDER_SITE_OTHER): Payer: Medicare Other | Admitting: Physician Assistant

## 2011-03-18 ENCOUNTER — Encounter: Payer: Self-pay | Admitting: Physician Assistant

## 2011-03-18 VITALS — BP 110/62 | HR 80 | Ht 63.0 in | Wt 173.0 lb

## 2011-03-18 DIAGNOSIS — K5790 Diverticulosis of intestine, part unspecified, without perforation or abscess without bleeding: Secondary | ICD-10-CM

## 2011-03-18 DIAGNOSIS — K5730 Diverticulosis of large intestine without perforation or abscess without bleeding: Secondary | ICD-10-CM

## 2011-03-18 DIAGNOSIS — A0472 Enterocolitis due to Clostridium difficile, not specified as recurrent: Secondary | ICD-10-CM

## 2011-03-18 DIAGNOSIS — R1032 Left lower quadrant pain: Secondary | ICD-10-CM

## 2011-03-18 NOTE — Patient Instructions (Signed)
We have given you a prescription for labs for you to take to [facility].   We have made you an appointment, with Dr. Leone Payor,  the first available was 04-18-2011.

## 2011-03-18 NOTE — Progress Notes (Addendum)
Subjective:    Patient ID: [name], female    DOB: 07/02/1933, 77 y.o.   MRN: [id number]  HPI [name] is a pleasant 77 year old white female known to Dr. Leone Payor with history of severe left colon diverticular disease. She was hospitalized in July of 2012 with diverticulitis, microperforation and abscess and also developed C. difficile colitis during that time. She was seen back in the office 01/28/2011 at that time was feeling much better without diarrhea. She had had some persistent left lower quadrant tenderness and CT scan was done to assess abscess resolution. CT showed significant left colon diverticulosis but no evidence of diverticulitis or residual or recurrent abscess.  She states that over the past month she has continued to have intermittent pain on her left side which is sharp at times says the pain may be present for an hour or 2 results in the not be therefore few days. This past Monday she did have pain and also had some pain in her left upper quadrant which has since resolved. She is not had any fever or chills she is back to having her daily hot flashes between 4 and 6:00 PM each day which she has had for years. Her appetite has been good and her bowel movements have been normal. Over the past couple of days she says she hasn't been bothered by any abdominal pain.   Unfortunately as she was getting out of the car in a parking lot today she fell and landed on her left knee. She says is not painful at this point and not swollen.    Review of Systems  Constitutional: Negative.   HENT: Negative.   Eyes: Negative.   Respiratory: Negative.   Cardiovascular: Negative.   Gastrointestinal: Positive for abdominal pain.  Genitourinary: Negative.   Musculoskeletal: Positive for arthralgias.  Skin: Negative.   Neurological: Negative.   Hematological: Bruises/bleeds easily.  Psychiatric/Behavioral: Negative.    Outpatient Prescriptions Prior to Visit  Medication Sig Dispense Refill    . albuterol (PROVENTIL HFA) 108 (90 BASE) MCG/ACT inhaler Inhale 2 puffs into the lungs. As directed       . albuterol (PROVENTIL) (2.5 MG/3ML) 0.083% nebulizer solution Take 2.5 mg by nebulization. As needed       . beclomethasone (QVAR) 40 MCG/ACT inhaler 2 puffs 2 (two) times daily. Nostril      . budesonide (RHINOCORT AQUA) 32 MCG/ACT nasal spray Place 1 spray into the nose daily. Inhale 1 spray in each nostril once daily       . candesartan (ATACAND) 16 MG tablet Take 16 mg by mouth daily.        . chlorpheniramine-HYDROcodone (TUSSIONEX PENNKINETIC ER) 10-8 MG/5ML LQCR Take 5 mLs by mouth. As needed       . Cholecalciferol (VITAMIN D3) 50000 UNITS CAPS Take 1 tablet by mouth once a week.        . cyclobenzaprine (FLEXERIL) 10 MG tablet Take 10 mg by mouth daily. Take 1 AS NEEDED      . docusate sodium (COLACE) 100 MG capsule Take 100 mg by mouth as needed. As directed      . donepezil (ARICEPT) 10 MG tablet Take 10 mg by mouth at bedtime as needed. Take 1 daily       . DULoxetine (CYMBALTA) 30 MG capsule Take 30 mg by mouth daily. Take 1 tab daily      . EPINEPHrine (EPIPEN) 0.3 mg/0.3 mL DEVI Inject 0.3 mg into the muscle once. As needed basis       .  Eszopiclone (ESZOPICLONE) 3 MG TABS Take by mouth at bedtime. Take immediately before bedtime      . furosemide (LASIX) 20 MG tablet Take 20 mg by mouth as needed. 1 tab      . hydrocodone-acetaminophen (LORCET-HD) 5-500 MG per capsule Take 1 capsule by mouth. As needed       . levothyroxine (SYNTHROID) 75 MCG tablet Take 1 every other day alternating with Synthroid 50 mcg.      . levothyroxine (SYNTHROID, LEVOTHROID) 50 MCG tablet Take 1 tab every other day.      . loratadine (CLARITIN) 10 MG tablet TAKE 1 TABLET (10 MG TOTAL) BY MOUTH DAILY.  30 tablet  5  . meloxicam (MOBIC) 15 MG tablet Take 15 mg by mouth daily.        . metoprolol (TOPROL-XL) 50 MG 24 hr tablet Take 50 mg by mouth daily.        [name] nystatin (MYCOSTATIN) 100000 UNIT/ML  suspension Take 500,000 Units by mouth at bedtime.  UP TO 3 TSP A DAY AS NEEDED FOR THRUSH      . omalizumab (XOLAIR) 150 MG injection Inject into the skin every 28 (twenty-eight) days. Both arms      . ondansetron (ZOFRAN) 4 MG tablet Take 1 tablet (4 mg total) by mouth every 6 (six) hours.  40 tablet  0  . pantoprazole (PROTONIX) 40 MG tablet Take 40 mg by mouth daily. 30-60 min before breakfast       . predniSONE (DELTASONE) 5 MG tablet Take 5 mg by mouth daily. Take 1 1/2  tab      . Ranitidine HCl 150 MG PACK Take by mouth. Take 2 tab at bedtime       . simvastatin (ZOCOR) 10 MG tablet Take 10 mg by mouth at bedtime.        . traZODone (DESYREL) 100 MG tablet Take 100 mg by mouth at bedtime.        . vitamin C (ASCORBIC ACID) 500 MG tablet Take 500 mg by mouth daily.        [name] warfarin (COUMADIN) 3 MG tablet Take by mouth as directed.        . zafirlukast (ACCOLATE) 20 MG tablet Take 20 mg by mouth 2 (two) times daily.         Allergies  Allergen Reactions  . Aspirin   . Ibuprofen   . Iron Dextran     REACTION: itching after test dose  . Metoclopramide Hcl     REACTION: chomping mouth  . Sulfonamide Derivatives    Patient Active Problem List  Diagnoses  . HISTORY OF BREAST CANCER  . HISTORY OF SARCOMA RIGHT HAND  . HYPOTHYROIDISM  . ANEMIA, SECONDARY TO CHRONIC BLOOD LOSS  . HYPERTENSION  . ATRIAL FIBRILLATION  . HEART FAILURE  . ALLERGIC RHINITIS  . OTHER DISEASES OF VOCAL CORDS  . ASTHMA  . GERD  . OSTEOARTHRITIS  . INSOMNIA  . Cough  . CARDIAC PACEMAKER IN SITU  . MULTIFOCAL ATRIAL TACHYCARDIA  .  Diverticulitis of sigmoid colon with abscess/contained perforation  . Intestinal infection due to Clostridium difficile  . Warfarin anticoagulation  . Dementia  . C. difficile colitis       Objective:   Physical Exam Well-developed elderly white female in no acute distress, pleasant, alert and oriented x3 Blood pressure 110/62 pulse 80, HEENT; nontraumatic  normocephalic EOMI PERRLA sclera anicteric,Neck; Supple no JVD Cardiovascular; regular rate and rhythm with S1-S2 no murmur  rub or gallop Pulmonary; clear bilaterally, Abdomen; soft bowel sounds active she is mildly tender to palpation of the left lower quadrant but no guarding no rebound no mass or hepatosplenomegaly, Rectal; not done, Extremities; scattered ecchymoses,I did examine her left knee as she fell on her way into our office today. The knee is nontender to exam non-small and and there is no broken skin she is able to bear weight normally. Psych; mood and affect normal an appropriate.        Assessment & Plan:  #47 77 year old female with history of diverticulitis and sigmoid abscess in July 2012 complicated by C. difficile colitis. Patient now with intermittent left lower quadrant pain over the past 4-6 weeks CT scan done 01/28/2011 with similar complaints of left lower quadrant tenderness did not show any evidence of recurrent diverticulitis. I am hesitant to empirically treat her with antibiotics given recent hospitalization with associated C. difficile colitis. I am not convinced she has diverticulitis at this time, consider earnest secondary to adhesions versus musculoskeletal etiology.  Plan; check CBC with differential, and BMeT today  Patient advised to call if she develops any fever chills, change in her bowel habits and/or more constant abdominal pain which she had with her previous episode of diverticulitis, and at that time would pursue imaging. Followup office visit with Dr. Leone Payor  in one month  Reviewed and agree with management. Carie Caddy. Pyrtle, M.D.  03/21/2011

## 2011-03-23 ENCOUNTER — Telehealth: Payer: Self-pay | Admitting: *Deleted

## 2011-03-23 NOTE — Telephone Encounter (Signed)
Called pt to advise we received her lab results from the lab at [facility]. Per Mike Gip PA-C her labs look good. I asked how she was feeling and she said it might be her imagination but she had an iron infusion last week and a B12 injection and she feels energized.  She has a slight bit of cramping but it is better than it was when she was in here the other day.  She is leaving today to drive to [place] for a 16XW anniversary of her husband [employer] reunion.  I told her to call us when she gets back if she has any further problems. She has an appointment with Dr. Leone Payor on 04-18-2011.

## 2011-04-01 ENCOUNTER — Ambulatory Visit (INDEPENDENT_AMBULATORY_CARE_PROVIDER_SITE_OTHER): Payer: Medicare Other | Admitting: *Deleted

## 2011-04-01 ENCOUNTER — Other Ambulatory Visit: Payer: Self-pay | Admitting: Oncology

## 2011-04-01 ENCOUNTER — Encounter (HOSPITAL_BASED_OUTPATIENT_CLINIC_OR_DEPARTMENT_OTHER): Payer: Medicare Other | Admitting: Oncology

## 2011-04-01 DIAGNOSIS — D649 Anemia, unspecified: Secondary | ICD-10-CM

## 2011-04-01 DIAGNOSIS — Z170 Estrogen receptor positive status [ER+]: Secondary | ICD-10-CM

## 2011-04-01 DIAGNOSIS — I4891 Unspecified atrial fibrillation: Secondary | ICD-10-CM

## 2011-04-01 DIAGNOSIS — C4910 Malignant neoplasm of connective and soft tissue of unspecified upper limb, including shoulder: Secondary | ICD-10-CM

## 2011-04-01 DIAGNOSIS — C50219 Malignant neoplasm of upper-inner quadrant of unspecified female breast: Secondary | ICD-10-CM

## 2011-04-01 LAB — CBC WITH DIFFERENTIAL/PLATELET
BASO%: 0.2 % (ref 0.0–2.0)
Basophils Absolute: 0 10*3/uL (ref 0.0–0.1)
Eosinophils Absolute: 0.1 10*3/uL (ref 0.0–0.5)
HCT: 35.7 % (ref 34.8–46.6)
HGB: 11.2 g/dL — ABNORMAL LOW (ref 11.6–15.9)
MONO#: 1.1 10*3/uL — ABNORMAL HIGH (ref 0.1–0.9)
NEUT#: 14.6 10*3/uL — ABNORMAL HIGH (ref 1.5–6.5)
NEUT%: 87.1 % — ABNORMAL HIGH (ref 38.4–76.8)
WBC: 16.7 10*3/uL — ABNORMAL HIGH (ref 3.9–10.3)
lymph#: 0.9 10*3/uL (ref 0.9–3.3)

## 2011-04-01 LAB — POCT INR: INR: 4.7

## 2011-04-12 ENCOUNTER — Encounter: Payer: Self-pay | Admitting: Oncology

## 2011-04-12 ENCOUNTER — Other Ambulatory Visit: Payer: Self-pay | Admitting: Oncology

## 2011-04-12 DIAGNOSIS — D518 Other vitamin B12 deficiency anemias: Secondary | ICD-10-CM

## 2011-04-12 HISTORY — DX: Other vitamin B12 deficiency anemias: D51.8

## 2011-04-15 ENCOUNTER — Ambulatory Visit (INDEPENDENT_AMBULATORY_CARE_PROVIDER_SITE_OTHER): Payer: Medicare Other | Admitting: *Deleted

## 2011-04-15 DIAGNOSIS — I4891 Unspecified atrial fibrillation: Secondary | ICD-10-CM

## 2011-04-18 ENCOUNTER — Encounter: Payer: Self-pay | Admitting: Internal Medicine

## 2011-04-18 ENCOUNTER — Ambulatory Visit (INDEPENDENT_AMBULATORY_CARE_PROVIDER_SITE_OTHER): Payer: Medicare Other | Admitting: Internal Medicine

## 2011-04-18 VITALS — BP 118/74 | HR 80 | Temp 98.8°F | Ht 64.0 in | Wt 176.0 lb

## 2011-04-18 DIAGNOSIS — R195 Other fecal abnormalities: Secondary | ICD-10-CM

## 2011-04-18 DIAGNOSIS — D500 Iron deficiency anemia secondary to blood loss (chronic): Secondary | ICD-10-CM

## 2011-04-18 DIAGNOSIS — G8929 Other chronic pain: Secondary | ICD-10-CM

## 2011-04-18 DIAGNOSIS — R1032 Left lower quadrant pain: Secondary | ICD-10-CM

## 2011-04-18 MED ORDER — LOPERAMIDE HCL 2 MG PO CHEW: CHEWABLE_TABLET | ORAL | Status: DC

## 2011-04-18 MED ORDER — DONEPEZIL HCL 10 MG PO TABS: 10.0000 mg | ORAL_TABLET | Freq: Every day | ORAL | Status: DC

## 2011-04-18 NOTE — Progress Notes (Signed)
  Subjective:    Patient ID: [name], female    DOB: 05/25/1934, 77 y.o.   MRN: [id number]  HPI 77 yo ww here to follow-up LLQ pain in setting of previous diverticulitis and abscess as well as C diff colitis.. Diverticulitis and abscess  shown to be resolved by CT in 01/2011. She was seen by my PA in past month and reassured. Overall still has some LLQ pain at times but is better and it is not steady and no fever. Relates that used to be constipated until diverticulitis and C diff problems this summer - and now has loose stools most of time.Recent urgent stool and leakage of feces after urinating. Only episode. Loperamide has helped at times but not using regularly.   Review of Systems Recent shingles of left chest wall Had successful iron infusion coordinated by Dr. Gaylyn Rong    Objective:   Physical Exam Elderly ww NAD Typical zoster rash left T6 dermatome - resolving        Assessment & Plan:

## 2011-04-18 NOTE — Patient Instructions (Signed)
Try using the Imodium as needed to prevent diarrhea - 1/2-1 tablet at a time. 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 If you get persistent abdominal pain or fever or uncontrollable diarrhea, call us back promptly. See Dr. Leone Payor as needed otherwise.

## 2011-04-19 DIAGNOSIS — G8929 Other chronic pain: Secondary | ICD-10-CM | POA: Insufficient documentation

## 2011-04-19 DIAGNOSIS — R195 Other fecal abnormalities: Secondary | ICD-10-CM | POA: Insufficient documentation

## 2011-04-19 NOTE — Assessment & Plan Note (Signed)
?   Post-infectious IBS and perhaps post antibiotic therapy problems- suspect so Trial of loperamide She will consider discontinuing donezepril but has been on x years (she thinks it does not help) even when constipated - it can cause diarrhea

## 2011-04-19 NOTE — Assessment & Plan Note (Signed)
Recent successful iron infusion per patient report

## 2011-04-19 NOTE — Assessment & Plan Note (Signed)
Better overall - do not think active diverticulitis but ? Of smoldering low-grade problem Overall I doubt and suspect muscular hypertrophy and spasm as cause observe

## 2011-04-25 ENCOUNTER — Ambulatory Visit (INDEPENDENT_AMBULATORY_CARE_PROVIDER_SITE_OTHER): Payer: Medicare Other | Admitting: *Deleted

## 2011-04-25 DIAGNOSIS — I4891 Unspecified atrial fibrillation: Secondary | ICD-10-CM

## 2011-04-29 ENCOUNTER — Other Ambulatory Visit: Payer: Self-pay | Admitting: Oncology

## 2011-05-02 ENCOUNTER — Ambulatory Visit: Payer: Medicare Other

## 2011-05-02 ENCOUNTER — Other Ambulatory Visit: Payer: Medicare Other

## 2011-05-04 ENCOUNTER — Other Ambulatory Visit: Payer: Self-pay | Admitting: Oncology

## 2011-05-04 ENCOUNTER — Encounter: Payer: Self-pay | Admitting: *Deleted

## 2011-05-04 NOTE — Progress Notes (Signed)
CBC rec'd from [facility] and forwarded to Dr. Gaylyn Rong.  Pt's Hgb 11.6, Hct 21.1 on 05/03/11.

## 2011-05-05 ENCOUNTER — Telehealth: Payer: Self-pay | Admitting: *Deleted

## 2011-05-06 ENCOUNTER — Telehealth: Payer: Self-pay | Admitting: *Deleted

## 2011-05-06 NOTE — Telephone Encounter (Signed)
Rec'd CBC results from [facility].  Dr. Gaylyn Rong reviewed results and instructed to let pt know her cbc is stable and to change CBC to every other month, but continue monthly Vit B12 injections.  Spoke w/ Okey Regal at [facility] w/ above instructions.  Faxed new Rx to her for above orders.  Informed pt will be call for appt to see Dr. Gaylyn Rong, labs and injection in January.  She verbalized understanding.

## 2011-05-06 NOTE — Telephone Encounter (Signed)
Opened in error

## 2011-05-09 ENCOUNTER — Ambulatory Visit (INDEPENDENT_AMBULATORY_CARE_PROVIDER_SITE_OTHER): Payer: Medicare Other | Admitting: *Deleted

## 2011-05-09 DIAGNOSIS — I4891 Unspecified atrial fibrillation: Secondary | ICD-10-CM

## 2011-05-09 LAB — POCT INR: INR: 1.7

## 2011-05-23 ENCOUNTER — Encounter: Payer: Medicare Other | Admitting: *Deleted

## 2011-05-27 ENCOUNTER — Ambulatory Visit (INDEPENDENT_AMBULATORY_CARE_PROVIDER_SITE_OTHER): Payer: Medicare Other | Admitting: *Deleted

## 2011-05-27 DIAGNOSIS — I4891 Unspecified atrial fibrillation: Secondary | ICD-10-CM

## 2011-05-30 ENCOUNTER — Other Ambulatory Visit: Payer: Medicare Other

## 2011-05-30 ENCOUNTER — Ambulatory Visit: Payer: Medicare Other

## 2011-05-30 ENCOUNTER — Other Ambulatory Visit: Payer: Self-pay | Admitting: *Deleted

## 2011-06-01 ENCOUNTER — Encounter: Payer: Self-pay | Admitting: *Deleted

## 2011-06-01 ENCOUNTER — Other Ambulatory Visit: Payer: Self-pay | Admitting: Internal Medicine

## 2011-06-01 NOTE — Progress Notes (Signed)
CBC rec'd from [facility].  Hgb 12.4.  Hct 40.1. Plts 313.  Forwarded to Dr. Gaylyn Rong.

## 2011-06-06 ENCOUNTER — Other Ambulatory Visit (HOSPITAL_COMMUNITY): Payer: Self-pay | Admitting: *Deleted

## 2011-06-07 ENCOUNTER — Encounter (HOSPITAL_COMMUNITY): Payer: Self-pay

## 2011-06-07 ENCOUNTER — Encounter (HOSPITAL_COMMUNITY)
Admission: RE | Admit: 2011-06-07 | Discharge: 2011-06-07 | Disposition: A | Discharge status: Home / Self Care | Payer: Medicare Other | Source: Ambulatory Visit | Attending: Internal Medicine | Admitting: Internal Medicine

## 2011-06-07 ENCOUNTER — Other Ambulatory Visit (HOSPITAL_COMMUNITY): Payer: Self-pay | Admitting: *Deleted

## 2011-06-07 DIAGNOSIS — M810 Age-related osteoporosis without current pathological fracture: Secondary | ICD-10-CM | POA: Insufficient documentation

## 2011-06-07 MED ORDER — ZOLEDRONIC ACID 5 MG/100ML IV SOLN
5.0000 mg | Freq: Once | INTRAVENOUS | Status: AC
  Administered 2011-06-07: 5 mg via INTRAVENOUS
  Filled 2011-06-07: qty 100

## 2011-06-07 MED ORDER — SODIUM CHLORIDE 0.9 % IV SOLN
INTRAVENOUS | Status: AC
  Administered 2011-06-07: 13:00:00 via INTRAVENOUS

## 2011-06-09 ENCOUNTER — Encounter: Payer: Medicare Other | Admitting: *Deleted

## 2011-06-10 ENCOUNTER — Ambulatory Visit (INDEPENDENT_AMBULATORY_CARE_PROVIDER_SITE_OTHER): Payer: Medicare Other | Admitting: *Deleted

## 2011-06-10 DIAGNOSIS — I4891 Unspecified atrial fibrillation: Secondary | ICD-10-CM

## 2011-06-17 ENCOUNTER — Encounter: Payer: Self-pay | Admitting: *Deleted

## 2011-06-18 ENCOUNTER — Telehealth: Payer: Self-pay | Admitting: Oncology

## 2011-06-18 NOTE — Telephone Encounter (Signed)
S/w pt, gave appt 06/30/11 @ 9.30am.

## 2011-06-21 HISTORY — PX: COLECTOMY WITH COLOSTOMY CREATION/HARTMANN PROCEDURE: SHX6598

## 2011-06-23 ENCOUNTER — Encounter: Payer: Medicare Other | Admitting: *Deleted

## 2011-06-24 ENCOUNTER — Encounter: Payer: Self-pay | Admitting: *Deleted

## 2011-06-29 ENCOUNTER — Telehealth: Payer: Self-pay | Admitting: *Deleted

## 2011-06-29 ENCOUNTER — Encounter: Payer: Self-pay | Admitting: *Deleted

## 2011-06-29 ENCOUNTER — Other Ambulatory Visit: Payer: Self-pay | Admitting: *Deleted

## 2011-06-29 NOTE — Progress Notes (Signed)
CBC received from [facility] and forwarded to Dr. Gaylyn Rong.  Hgb 12.4.  Pt is getting monthly CBC and Vitamin B12 injections by RN at [facility] per Dr. Lodema Pilot orders.   [address] [phone],  Fax# [phone].

## 2011-06-29 NOTE — Telephone Encounter (Signed)
Per Dr. Gaylyn Rong,  Pt does not need labs tomorrow prior to office visit since she just had CBC done at [facility].  Called pt to inform her of no lab appt tomorrow,  Only MD visit at 10 am.  She verbalized understanding.

## 2011-06-30 ENCOUNTER — Telehealth: Payer: Self-pay | Admitting: Oncology

## 2011-06-30 ENCOUNTER — Other Ambulatory Visit: Payer: Medicare Other | Admitting: Lab

## 2011-06-30 ENCOUNTER — Ambulatory Visit (HOSPITAL_BASED_OUTPATIENT_CLINIC_OR_DEPARTMENT_OTHER): Payer: Medicare Other | Admitting: Oncology

## 2011-06-30 VITALS — BP 141/68 | HR 80 | Temp 98.5°F | Ht 64.5 in | Wt 175.7 lb

## 2011-06-30 DIAGNOSIS — E785 Hyperlipidemia, unspecified: Secondary | ICD-10-CM

## 2011-06-30 DIAGNOSIS — D500 Iron deficiency anemia secondary to blood loss (chronic): Secondary | ICD-10-CM

## 2011-06-30 DIAGNOSIS — I4891 Unspecified atrial fibrillation: Secondary | ICD-10-CM

## 2011-06-30 DIAGNOSIS — E538 Deficiency of other specified B group vitamins: Secondary | ICD-10-CM

## 2011-06-30 DIAGNOSIS — J189 Pneumonia, unspecified organism: Secondary | ICD-10-CM

## 2011-06-30 DIAGNOSIS — Z853 Personal history of malignant neoplasm of breast: Secondary | ICD-10-CM

## 2011-06-30 NOTE — Telephone Encounter (Signed)
S/w the pt and she is aware of her oct 04/01/2012 appt

## 2011-06-30 NOTE — Progress Notes (Signed)
[facility] OFFICE PROGRESS NOTE  Cc:  [name], MD, MD  DIAGNOSIS:  Iron-deficiency anemia presumably from diverculosis and VitB12 deficiency.   PAST THERAPY:  IV iron.   CURRENT THERAPY:  Watchful observation and repeat IV iron prn severe iron deficiency; VitB12 injection per PCP.   INTERVAL HISTORY: [name] 78 y.o. female returns for regular follow up.  She still has chronic fatigue despite normalization of her Hgb with IV iron.  She has COPD and her activity is limited by this.  She has felt cold in the afternoon in the left hand without edema, swelling, or pain in the left arm.  She has a mammogram on 03/17/2011 which was negative.  She has chronic back pain especially when she stands for more than 5 minutes.  She has sciatica history and this back pain has not worsened from baseline.  She denies lower extremity paresthesia, leg weakness, bowel or bladder incontinence.  She has had chronic bronchitis and was recently given a course of oral antibiotic which improved to cough to chronic baseline.   Patient denies headache, visual changes, confusion, drenching night sweats, palpable lymph node swelling, mucositis, odynophagia, dysphagia, nausea vomiting, jaundice, chest pain, palpitation, gum bleeding, epistaxis, hematemesis, hemoptysis, abdominal pain, abdominal swelling, early satiety, melena, hematochezia, hematuria, skin rash, spontaneous bleeding, joint swelling, joint pain, heat or cold intolerance, bowel bladder incontinence, focal motor weakness, paresthesia, depression, suicidal or homocidal ideation, feeling hopelessness.   MEDICAL HISTORY: Past Medical History  Diagnosis Date  . Sarcoma     right hand  . Insomnia, unspecified   . Osteoarthrosis, unspecified whether generalized or localized, unspecified site   . Unspecified hypothyroidism   . Unspecified essential hypertension   . Atrial fibrillation   . Sciatica   . Malignant neoplasm of breast  (female), unspecified site   . Anemia   . COPD (chronic obstructive pulmonary disease)   . UTI (urinary tract infection)   . GERD (gastroesophageal reflux disease)   . Pneumonia   . Asthma   . Clostridium difficile colitis   . Diverticulitis of sigmoid colon 11/2010    with abscess/contained perforation  . Dementia     mild  . Dehydration with hyponatremia 12/2010    hospitalized  . Angiodysplasia of stomach     ablated 2010  . Angiodysplasia of colon     ablated 2010  . Vitamin B12 deficiency (dietary) anemia 04/12/2011  . Shingles     SURGICAL HISTORY:  Past Surgical History  Procedure Date  . Nasal sinus surgery     x 3  . Appendectomy   . Hernia repair   . Abdominal hysterectomy   . Pacemaker insertion   . Eye surgery     x6-bilateral  . Finger amputation     right  . Foot surgery     right toes x 3  . Tonsillectomy   . Colonoscopy 01/2009    w/ablation of 3 mm AVM, diverticulosis, external hemorrhoids  . Upper gastrointestinal endoscopy 01/2009    w/polypectomy, AVM, hyperplastic polyp    MEDICATIONS: Current Outpatient Prescriptions  Medication Sig Dispense Refill  . albuterol (PROVENTIL HFA) 108 (90 BASE) MCG/ACT inhaler Inhale 2 puffs into the lungs 2 (two) times daily. As directed      . albuterol (PROVENTIL) (2.5 MG/3ML) 0.083% nebulizer solution Take 2.5 mg by nebulization. As needed       . beclomethasone (QVAR) 40 MCG/ACT inhaler 2 puffs 2 (two) times daily.       [name]  budesonide (RHINOCORT AQUA) 32 MCG/ACT nasal spray 1 spray daily. Inhale 1 spray in each nostril once daily      . candesartan (ATACAND) 16 MG tablet Take 16 mg by mouth daily.        . chlorpheniramine-HYDROcodone (TUSSIONEX PENNKINETIC ER) 10-8 MG/5ML LQCR Take 5 mLs by mouth 2 (two) times daily. As needed      . Cholecalciferol (VITAMIN D3) 50000 UNITS CAPS Take 1 tablet by mouth once a week.       . cyclobenzaprine (FLEXERIL) 10 MG tablet Take 10 mg by mouth daily. Take 1 AS NEEDED        . docusate sodium (COLACE) 100 MG capsule Take 100 mg by mouth as needed.       . donepezil (ARICEPT) 10 MG tablet Take 1 tablet (10 mg total) by mouth at bedtime.  30 tablet  0  . DULoxetine (CYMBALTA) 30 MG capsule Take 30 mg by mouth daily. Take 1 tab daily      . EPINEPHrine (EPIPEN) 0.3 mg/0.3 mL DEVI Inject 0.3 mg into the muscle once. As needed basis       . Eszopiclone (ESZOPICLONE) 3 MG TABS Take by mouth at bedtime. Lunesta       . furosemide (LASIX) 20 MG tablet Take 20 mg by mouth as needed. 1 tab      . gabapentin (NEURONTIN) 300 MG capsule 300 mg. One tablet by mouth QAM and two tablets QPM      . hydrocodone-acetaminophen (LORCET-HD) 5-500 MG per capsule Take 1 capsule by mouth. As needed       . levothyroxine (SYNTHROID) 75 MCG tablet Take 1 every other day alternating with Synthroid 50 mcg.      . levothyroxine (SYNTHROID, LEVOTHROID) 50 MCG tablet Take 1 tab every other day.      . Loperamide HCl (IMODIUM A-D) 2 MG CHEW 1/2-1 tablet as needed to prevent diarrhea  2 tablet    . meloxicam (MOBIC) 15 MG tablet Take 15 mg by mouth daily.        . metoprolol (TOPROL-XL) 50 MG 24 hr tablet Take 50 mg by mouth daily.        [name] nystatin (MYCOSTATIN) 100000 UNIT/ML suspension Take 500,000 Units by mouth at bedtime.  UP TO 3 TSP A DAY AS NEEDED FOR THRUSH      . omalizumab (XOLAIR) 150 MG injection Inject into the skin every 28 (twenty-eight) days. Both arms      . ondansetron (ZOFRAN) 4 MG tablet Take 1 tablet (4 mg total) by mouth every 6 (six) hours.  40 tablet  0  . pantoprazole (PROTONIX) 40 MG tablet Take 40 mg by mouth daily. 30-60 min before breakfast      . predniSONE (DELTASONE) 5 MG tablet Take 5 mg by mouth daily. Take 1 1/2  tab      . Ranitidine HCl 150 MG PACK Take by mouth. Take 2 tab at bedtime       . simvastatin (ZOCOR) 10 MG tablet Take 10 mg by mouth at bedtime.        . traZODone (DESYREL) 100 MG tablet Take 100 mg by mouth at bedtime.        [name] warfarin  (COUMADIN) 3 MG tablet USE AS DIRECTED BY COUMADIN CLINIC TAKE 1 & 1/2 TABLETS DAILY  90 tablet  2  . zafirlukast (ACCOLATE) 20 MG tablet Take 20 mg by mouth 2 (two) times daily.        [name]  loratadine (CLARITIN) 10 MG tablet TAKE 1 TABLET (10 MG TOTAL) BY MOUTH DAILY.  30 tablet  5    ALLERGIES:  is allergic to aspirin; ibuprofen; iron dextran; metoclopramide hcl; and sulfonamide derivatives.  REVIEW OF SYSTEMS:  The rest of the 14-point review of system was negative.   Filed Vitals:   06/30/11 1014  BP: 141/68  Pulse: 80  Temp: 98.5 F (36.9 C)   Wt Readings from Last 3 Encounters:  06/30/11 175 lb 11.2 oz (79.697 kg)  03/02/11 172 lb 14.4 oz (78.427 kg)  06/07/11 175 lb (79.379 kg)   ECOG Performance status: 2  PHYSICAL EXAMINATION:   General:  Mildly obese woman in no acute distress.  Eyes:  no scleral icterus.  ENT:  There were no oropharyngeal lesions.  Neck was without thyromegaly.  Lymphatics:  Negative cervical, supraclavicular or axillary adenopathy.  Respiratory: mild expiratory wheezing without increased work of breathing.  Cardiovascular:  Regular rate and rhythm, S1/S2, without murmur, rub or gallop.  There was no pedal edema.  GI:  abdomen was soft, flat, nontender, nondistended, without organomegaly.  Muscoloskeletal:  no spinal tenderness of palpation of vertebral spine.  Skin exam was without echymosis, petichae.  Neuro exam was nonfocal.  Patient was able to get on and off exam table without assistance.  Gait was normal.  Patient was alerted and oriented.  Attention was good.   Language was appropriate.  Mood was normal without depression.  Speech was not pressured.  Thought content was not tangential.  She deferred breast exam since she had neg mammo in 02/2011.  I could not appreciate left arm/hand swelling or pain on palpation.     ASSESSMENT AND PLAN:    1. Anemia:  Doing well on VitB12 injection.  Her iron is pending today, but her Hgb is much improved compared to  prior IV iron.   Continue VitB12 injection with PCP.  She does not tolerate oral iron.  In the future, if she develops severe iron deficiency again, I may consider repeating Feraheme.  2. History of breast cancer.  She has no evidence of recurrence of disease today.  Her next mammogram is due in 02/2012.  The reason for her to develop cold sensation in the left hand in the afternoon is unclear; however, it is most likely non related to her history of breast cancer but rather focal nerve issue since the symptom is rather focal and time dependent.  3. History of sarcoma.  There is no evidence of recurrence locally in her exam on her right arm and right axilla.  4. Chronic obstructive pulmonary disease.  She is on inhalers and O2.   5. Allergies.  She is on prednisone and montelukast.  6. Congestive heart failure.  She is on Lasix and metoprolol per cardiologist.   She is euvolumic on exam today.  7. Atrial fibrillation.  She is on Coumadin per cardiologist, and rate control with metoprolol.   8. Hyperlipidemia.  She is on simvastatin. 9. Hypothyroidism.  She is on levothyroxine per PCP.  10. Chronic low-back pain from degenerative joint disease on chronic Vicodin. 11. Follow up:  Lab in 3 and 6 months.  I'll see her in 9 months.   The length of time of the face-to-face encounter was 15  minutes. More than 50% of time was spent counseling and coordination of care.

## 2011-07-01 ENCOUNTER — Encounter: Payer: Medicare Other | Admitting: *Deleted

## 2011-07-01 ENCOUNTER — Ambulatory Visit (INDEPENDENT_AMBULATORY_CARE_PROVIDER_SITE_OTHER): Payer: Medicare Other | Admitting: *Deleted

## 2011-07-01 DIAGNOSIS — I4891 Unspecified atrial fibrillation: Secondary | ICD-10-CM

## 2011-07-01 LAB — POCT INR: INR: 2.2

## 2011-07-09 ENCOUNTER — Encounter: Payer: Self-pay | Admitting: Internal Medicine

## 2011-07-11 ENCOUNTER — Ambulatory Visit (INDEPENDENT_AMBULATORY_CARE_PROVIDER_SITE_OTHER): Payer: Medicare Other | Admitting: *Deleted

## 2011-07-11 DIAGNOSIS — Z950 Presence of cardiac pacemaker: Secondary | ICD-10-CM

## 2011-07-11 DIAGNOSIS — I4891 Unspecified atrial fibrillation: Secondary | ICD-10-CM

## 2011-07-17 LAB — REMOTE PACEMAKER DEVICE
AL AMPLITUDE: 2.8 mv
AL IMPEDENCE PM: 512 Ohm
AL THRESHOLD: 0.75 V
BATTERY VOLTAGE: 2.75 V
RV LEAD THRESHOLD: 0.625 V

## 2011-07-18 NOTE — Progress Notes (Signed)
Remote pacer check  

## 2011-07-25 ENCOUNTER — Telehealth: Payer: Self-pay | Admitting: Internal Medicine

## 2011-07-25 NOTE — Telephone Encounter (Signed)
Husband reports diarrhea, abdominal pain, bloating, llq pain.  She is "hot in the afternoons like she has a fever", they states they don't have a thermometer.  The patient feels generally bad,  They will come in and see Mike Gip PA at 11:00 07/26/11

## 2011-07-26 ENCOUNTER — Encounter: Payer: Self-pay | Admitting: Physician Assistant

## 2011-07-26 ENCOUNTER — Other Ambulatory Visit (INDEPENDENT_AMBULATORY_CARE_PROVIDER_SITE_OTHER): Payer: Medicare Other

## 2011-07-26 ENCOUNTER — Ambulatory Visit (INDEPENDENT_AMBULATORY_CARE_PROVIDER_SITE_OTHER): Payer: Medicare Other | Admitting: Physician Assistant

## 2011-07-26 DIAGNOSIS — R140 Abdominal distension (gaseous): Secondary | ICD-10-CM

## 2011-07-26 DIAGNOSIS — R1032 Left lower quadrant pain: Secondary | ICD-10-CM

## 2011-07-26 DIAGNOSIS — R197 Diarrhea, unspecified: Secondary | ICD-10-CM

## 2011-07-26 DIAGNOSIS — R110 Nausea: Secondary | ICD-10-CM

## 2011-07-26 DIAGNOSIS — R141 Gas pain: Secondary | ICD-10-CM

## 2011-07-26 DIAGNOSIS — K5730 Diverticulosis of large intestine without perforation or abscess without bleeding: Secondary | ICD-10-CM

## 2011-07-26 DIAGNOSIS — R143 Flatulence: Secondary | ICD-10-CM

## 2011-07-26 DIAGNOSIS — Z8619 Personal history of other infectious and parasitic diseases: Secondary | ICD-10-CM | POA: Insufficient documentation

## 2011-07-26 DIAGNOSIS — K5790 Diverticulosis of intestine, part unspecified, without perforation or abscess without bleeding: Secondary | ICD-10-CM

## 2011-07-26 LAB — BASIC METABOLIC PANEL
Chloride: 102 mEq/L (ref 96–112)
Creatinine, Ser: 1.3 mg/dL — ABNORMAL HIGH (ref 0.4–1.2)
GFR: 44.35 mL/min — ABNORMAL LOW (ref >60.00)
Potassium: 3.9 mEq/L (ref 3.5–5.1)

## 2011-07-26 LAB — CBC WITH DIFFERENTIAL/PLATELET
Basophils Relative: 0.8 % (ref 0.0–3.0)
Eosinophils Relative: 1.5 % (ref 0.0–5.0)
HCT: 36.9 % (ref 36.0–46.0)
MCV: 93.4 fl (ref 78.0–100.0)
Monocytes Absolute: 0.7 10*3/uL (ref 0.1–1.0)
Monocytes Relative: 5.6 % (ref 3.0–12.0)
Neutrophils Relative %: 83 % — ABNORMAL HIGH (ref 43.0–77.0)
Platelets: 284 10*3/uL (ref 150.0–400.0)
RBC: 3.95 Mil/uL (ref 3.87–5.11)
WBC: 13 10*3/uL — ABNORMAL HIGH (ref 4.5–10.5)

## 2011-07-26 MED ORDER — ONDANSETRON 4 MG PO TBDP: ORAL_TABLET | ORAL | Status: DC

## 2011-07-26 NOTE — Progress Notes (Signed)
Subjective:    Patient ID: [name], female    DOB: 01-06-1934, 78 y.o.   MRN: [id number]  HPI [name] is a very nice 78 year old female known to Dr. Leone Payor who has multiple medical problems. She was hospitalized in July of 2012 with acute diverticulitis complicated by a microperforation abscess and development of C. difficile colitis. She is known to have a severe left colon diverticulosis. Since that episode she has had more problems with fairly persistent left-sided abdominal pain. She has also had intermittent diarrhea. She was seen by Dr. Elenore Paddy in October of 2012 at that time given Levsin as needed as well as Imodium. Her husband says that over the past 3-4 months her symptoms have become much more frequent and he says that she is feeling bad more days than she is feeling good. She has had some intermittent problems with dry heaves which have not necessarily been associated with meals or antecedent nausea. She also describes intermittent episodes of diaphoresis especially at night which he says does not feel the same as she feels when she has hot flashes. Interestingly she and her by the husband both state that after she was treated for the C. difficile she did not have any problems with sweats for multiple weeks.  She says she's taking Imodium almost every day and despite that she still has 3-4 loose bowel movements per day. She's not having any nocturnal diarrhea has not noted any blood. Over the past few weeks she has had more persistent left lower quadrant pain again and says in general she "feels awful". She has a lot of problems with bloating, gas ,and generalized abdominal discomfort. She also mentions that since she has been on protonix she has had a lot more problems with gas.    Review of Systems  Constitutional: Positive for diaphoresis and fatigue.  HENT: Negative.   Eyes: Negative.   Respiratory: Negative.   Cardiovascular: Negative.   Gastrointestinal: Positive for nausea,  abdominal pain, diarrhea and abdominal distention.  Genitourinary: Negative.   Musculoskeletal: Negative.   Skin: Negative.   Neurological: Negative.   Hematological: Negative.    Outpatient Prescriptions Prior to Visit  Medication Sig Dispense Refill  . albuterol (PROVENTIL HFA) 108 (90 BASE) MCG/ACT inhaler Inhale 2 puffs into the lungs 2 (two) times daily. As directed      . albuterol (PROVENTIL) (2.5 MG/3ML) 0.083% nebulizer solution Take 2.5 mg by nebulization. As needed       . beclomethasone (QVAR) 40 MCG/ACT inhaler 2 puffs 2 (two) times daily.       . budesonide (RHINOCORT AQUA) 32 MCG/ACT nasal spray 1 spray daily. Inhale 1 spray in each nostril once daily      . candesartan (ATACAND) 16 MG tablet Take 16 mg by mouth daily.        . chlorpheniramine-HYDROcodone (TUSSIONEX PENNKINETIC ER) 10-8 MG/5ML LQCR Take 5 mLs by mouth 2 (two) times daily. As needed      . Cholecalciferol (VITAMIN D3) 50000 UNITS CAPS Take 1 tablet by mouth once a week.       . cyclobenzaprine (FLEXERIL) 10 MG tablet Take 10 mg by mouth daily. Take 1 AS NEEDED      . docusate sodium (COLACE) 100 MG capsule Take 100 mg by mouth as needed.       . donepezil (ARICEPT) 10 MG tablet Take 1 tablet (10 mg total) by mouth at bedtime.  30 tablet  0  . DULoxetine (CYMBALTA) 30 MG capsule Take  30 mg by mouth daily. Take 1 tab daily      . EPINEPHrine (EPIPEN) 0.3 mg/0.3 mL DEVI Inject 0.3 mg into the muscle once. As needed basis       . Eszopiclone (ESZOPICLONE) 3 MG TABS Take by mouth at bedtime. Lunesta       . furosemide (LASIX) 20 MG tablet Take 20 mg by mouth as needed. 1 tab      . gabapentin (NEURONTIN) 300 MG capsule 300 mg. One tablet by mouth QAM and two tablets QPM      . hydrocodone-acetaminophen (LORCET-HD) 5-500 MG per capsule Take 1 capsule by mouth. As needed       . levothyroxine (SYNTHROID) 75 MCG tablet Take 1 every other day alternating with Synthroid 50 mcg.      . levothyroxine (SYNTHROID,  LEVOTHROID) 50 MCG tablet Take 1 tab every other day.      . Loperamide HCl (IMODIUM A-D) 2 MG CHEW 1/2-1 tablet as needed to prevent diarrhea  2 tablet    . loratadine (CLARITIN) 10 MG tablet TAKE 1 TABLET (10 MG TOTAL) BY MOUTH DAILY.  30 tablet  5  . meloxicam (MOBIC) 15 MG tablet Take 15 mg by mouth daily.        . metoprolol (TOPROL-XL) 50 MG 24 hr tablet Take 50 mg by mouth daily.        [name] nystatin (MYCOSTATIN) 100000 UNIT/ML suspension Take 500,000 Units by mouth at bedtime.  UP TO 3 TSP A DAY AS NEEDED FOR THRUSH      . omalizumab (XOLAIR) 150 MG injection Inject into the skin every 28 (twenty-eight) days. Both arms      . pantoprazole (PROTONIX) 40 MG tablet Take 40 mg by mouth daily. 30-60 min before breakfast      . predniSONE (DELTASONE) 5 MG tablet Take 5 mg by mouth daily. Take 1 1/2  tab      . Ranitidine HCl 150 MG PACK Take by mouth. Take 2 tab at bedtime       . simvastatin (ZOCOR) 10 MG tablet Take 10 mg by mouth at bedtime.        . traZODone (DESYREL) 100 MG tablet Take 100 mg by mouth at bedtime.        [name] warfarin (COUMADIN) 3 MG tablet USE AS DIRECTED BY COUMADIN CLINIC TAKE 1 & 1/2 TABLETS DAILY  90 tablet  2  . zafirlukast (ACCOLATE) 20 MG tablet Take 20 mg by mouth 2 (two) times daily.        . ondansetron (ZOFRAN) 4 MG tablet Take 1 tablet (4 mg total) by mouth every 6 (six) hours.  40 tablet  0   Allergies  Allergen Reactions  . Aspirin   . Ibuprofen   . Iron Dextran     REACTION: itching after test dose  . Metoclopramide Hcl     REACTION: chomping mouth  . Sulfonamide Derivatives        Active Ambulatory Problems    Diagnosis Date Noted  . HISTORY OF BREAST CANCER 09/20/2007  . HISTORY OF SARCOMA RIGHT HAND 09/21/2007  . HYPOTHYROIDISM 09/20/2007  . ANEMIA, SECONDARY TO CHRONIC BLOOD LOSS 01/05/2009  . HYPERTENSION 09/20/2007  . ATRIAL FIBRILLATION 09/20/2007  . HEART FAILURE 09/21/2007  . ALLERGIC RHINITIS 04/01/2010  . OTHER DISEASES OF VOCAL  CORDS 10/04/2007  . ASTHMA 09/20/2007  . GERD 04/01/2010  . OSTEOARTHRITIS 09/20/2007  . INSOMNIA 09/21/2007  . CARDIAC PACEMAKER IN SITU 01/23/2009  .  MULTIFOCAL ATRIAL TACHYCARDIA 07/28/2010  . Warfarin anticoagulation 12/21/2010  . Dementia 01/28/2011  . Vitamin B12 deficiency (dietary) anemia 04/12/2011  . Chronic LLQ pain and tenderness 04/19/2011  . Loose stools 04/19/2011  . Diverticulosis 07/26/2011  . Hx of Clostridium difficile infection 07/26/2011   Resolved Ambulatory Problems    Diagnosis Date Noted  . Acute respiratory failure 09/21/2007  . HIATAL HERNIA 09/21/2007  . HEPATIC CYST 09/20/2007  . Sciatica 09/20/2007  . HAMMER TOE 09/21/2007  . WEIGHT GAIN 04/01/2010  . Shortness of breath 03/05/2010  . Cough 04/01/2010  . FLATULENCE 01/05/2009  . Diarrhea 04/01/2010  . SLEEP DISORDER, HX OF 09/20/2007  . WEAKNESS 07/28/2010  . LLQ abdominal pain 11/23/2010  . Bloating 11/23/2010  .  Diverticulitis of sigmoid colon with abscess/contained perforation 12/20/2010  . Intestinal infection due to Clostridium difficile 12/20/2010  . C. difficile colitis 03/18/2011   Past Medical History  Diagnosis Date  . Sarcoma   . Anemia   . COPD (chronic obstructive pulmonary disease)   . UTI (urinary tract infection)   . GERD (gastroesophageal reflux disease)   . Pneumonia   . Asthma   . Clostridium difficile colitis   . Diverticulitis of sigmoid colon 11/2010  . Dehydration with hyponatremia 12/2010  . Angiodysplasia of stomach   . Angiodysplasia of colon   . Shingles     Objective:   Physical Exam well-developed chronically ill-appearing white female accompanied by her husband, very pleasant. Blood pressure 110/70 pulse 80 weight 171. HEENT; nontraumatic normocephalic EOMI PERRLA sclera anicteric,neck; Supple no JVD, Cardiovascular; regular rate and rhythm with S1-S2 no murmur or gallop, Pulmonary; clear bilaterally, Abdomen; soft bowel sounds, active she is tender in the  left mid quadrant and left lower quadrant no guarding, no rebound ,no palpable mass or hepatosplenomegaly. Rectal; exam not done. Extremities ;no clubbing, cyanosis or edema  skin warm and dry, Psych; mood and affect appropriate, she does have mild dementia and looks to her husband to help with questions.        Assessment & Plan:  #54 78 year old female with history of diverticulitis and C. difficile colitis summer 2012 now with complaints of persistent left lower quadrant discomfort over the past 2-3 weeks chronic diarrhea Donald bloating gas intermittent dry heaves and sweats. Etiology of all of her symptoms is not clear. I do think we need to rule out a recurrent C. difficile infection, and also need to rule out diverticulitis which has been smoldering. Plan; Will give her a trial of Zofran 4 mg ODT for her intermittent dry heaves She does find Levsin sublingual when necessary helpful and we'll continue this Stop Protonix as she feels that she has had increased gas, she will try Prevacid 30 mg by mouth daily which her husband has a large supply of it is not using. This will be given each morning before breakfast Start fluoroscopy store one by mouth twice daily x2-3 weeks Check CBC, CRP, BMET, today CT scan abdomen and pelvis as I am hesitant to empirically treat her for diverticulitis given her history of C. difficile. Check stool for wbc's and stool for C. difficile PCR Low gas diet Further plans pending results of above

## 2011-07-26 NOTE — Progress Notes (Signed)
I agree with the plan as outlined in this note. I would not want to start on empiric antibiotics given her history of Clostridium difficile.

## 2011-07-26 NOTE — Patient Instructions (Addendum)
Please go to the basement level to have your labs drawn.  We have a low gas diet for you.  We have sent a prescription for Zofran to your pharmacy. You have been scheduled for a CT scan of the abdomen and pelvis at [facility] ([address]---this is in the same building as [facility]).   You are scheduled on 07-28-2011 at 10:00 AM. You should arrive 15 minutes prior to your appointment time for registration. Please follow the written instructions below on the day of your exam:  WARNING: IF YOU ARE ALLERGIC TO IODINE/X-RAY DYE, PLEASE NOTIFY RADIOLOGY IMMEDIATELY AT [phone]! YOU WILL BE GIVEN A 13 HOUR PREMEDICATION PREP.  1) Do not eat or drink anything after 6:00 AM (4 hours prior to your test) 2) You have been given 2 bottles of oral contrast to drink. The solution may taste better if refrigerated, but do NOT add ice or any other liquid to this solution. Shake  well before drinking.    Drink 1 bottle of contrast @ 8:00 AM (2 hours prior to your exam)  Drink 1 bottle of contrast @ 9:00 AM(1 hour prior to your exam)  You may take any medications as prescribed with a small amount of water except for the following: Metformin, Glucophage, Glucovance, Avandamet, Riomet, Fortamet, Actoplus Met, Janumet, Glumetza or Metaglip. The above medications must be held the day of the exam AND 48 hours after the exam.  The purpose of you drinking the oral contrast is to aid in the visualization of your intestinal tract. The contrast solution may cause some diarrhea. Before your exam is started, you will be given a small amount of fluid to drink. Depending on your individual set of symptoms, you may also receive an intravenous injection of x-ray contrast/dye. Plan on being at [facility] for 30 minutes or long, depending on the type of exam you are having performed.  If you have any questions regarding your exam or if you need to reschedule, you may call the CT department at  [phone] between the hours of 8:00 am and 5:00 pm, Monday-Friday.  Stop the Protonix for now. Take Prevacid 30 mg 1 tab daily in the AM. Call us for a prescription if you need more of the Prevacid. ________________________________________________________________________

## 2011-07-27 ENCOUNTER — Encounter: Payer: Self-pay | Admitting: *Deleted

## 2011-07-27 ENCOUNTER — Encounter: Payer: Medicare Other | Admitting: *Deleted

## 2011-07-27 LAB — C-REACTIVE PROTEIN: CRP: 4.75 mg/dL — ABNORMAL HIGH (ref <0.60)

## 2011-07-28 ENCOUNTER — Ambulatory Visit (INDEPENDENT_AMBULATORY_CARE_PROVIDER_SITE_OTHER): Payer: Medicare Other | Admitting: *Deleted

## 2011-07-28 ENCOUNTER — Encounter: Payer: Medicare Other | Admitting: *Deleted

## 2011-07-28 ENCOUNTER — Ambulatory Visit (INDEPENDENT_AMBULATORY_CARE_PROVIDER_SITE_OTHER)
Admission: RE | Admit: 2011-07-28 | Discharge: 2011-07-28 | Disposition: A | Discharge status: Home / Self Care | Payer: Medicare Other | Source: Ambulatory Visit | Attending: Physician Assistant | Admitting: Physician Assistant

## 2011-07-28 ENCOUNTER — Telehealth: Payer: Self-pay | Admitting: *Deleted

## 2011-07-28 DIAGNOSIS — I4891 Unspecified atrial fibrillation: Secondary | ICD-10-CM

## 2011-07-28 DIAGNOSIS — R197 Diarrhea, unspecified: Secondary | ICD-10-CM

## 2011-07-28 DIAGNOSIS — R141 Gas pain: Secondary | ICD-10-CM

## 2011-07-28 DIAGNOSIS — R1032 Left lower quadrant pain: Secondary | ICD-10-CM

## 2011-07-28 DIAGNOSIS — R140 Abdominal distension (gaseous): Secondary | ICD-10-CM

## 2011-07-28 DIAGNOSIS — R142 Eructation: Secondary | ICD-10-CM

## 2011-07-28 DIAGNOSIS — R110 Nausea: Secondary | ICD-10-CM

## 2011-07-28 LAB — POCT INR: INR: 2.1

## 2011-07-28 MED ORDER — SACCHAROMYCES BOULARDII 250 MG PO CAPS: ORAL_CAPSULE | ORAL | Status: DC

## 2011-07-28 MED ORDER — METRONIDAZOLE 500 MG PO TABS: ORAL_TABLET | ORAL | Status: DC

## 2011-07-28 MED ORDER — CIPROFLOXACIN HCL 500 MG PO TABS: ORAL_TABLET | ORAL | Status: DC

## 2011-07-28 MED ORDER — IOHEXOL 300 MG/ML  SOLN
100.0000 mL | Freq: Once | INTRAMUSCULAR | Status: AC | PRN
  Administered 2011-07-28: 80 mL via INTRAVENOUS

## 2011-07-28 NOTE — Telephone Encounter (Signed)
Message copied by Daphine Deutscher on Thu Jul 28, 2011  2:46 PM ------      Message from: [place], [place] S      Created: Thu Jul 28, 2011  1:20 PM       Please call [name] and let her know her labs showed a mild elevated white count. The Ct shows a mild diverticulitis. I would like her to take cipro  500 mg twice daily x 14 days and flagyl 500 mg twice daily x 14 days-also ask her to take florastor twice daily for 2-3 weeks. Thanks. She will need a follow up with D.r Leone Payor in 3 weeks. thanks

## 2011-07-28 NOTE — Telephone Encounter (Signed)
[name] with [facility] called and left a message that patient will come on 08/01/11 for PT check. Spoke with patient and gave her results and recommendations by Mike Gip, PA . She will pick up her rx's. Gave her the appointment date and time for Dr. Leone Payor OV.

## 2011-07-28 NOTE — Telephone Encounter (Signed)
error 

## 2011-07-28 NOTE — Telephone Encounter (Signed)
Left a message with patient's husband for patient to call me. Called [facility]madin clinic at ext [phone] and left a message that patient is going to be on Cipro and Flagyl and requested they confirm receipt of message. Scheduled patient with Dr. Leone Payor on 08/21/12 at 11:30 AM

## 2011-07-29 ENCOUNTER — Encounter: Payer: Medicare Other | Admitting: *Deleted

## 2011-08-01 ENCOUNTER — Encounter: Payer: Medicare Other | Admitting: *Deleted

## 2011-08-02 ENCOUNTER — Ambulatory Visit (INDEPENDENT_AMBULATORY_CARE_PROVIDER_SITE_OTHER): Payer: Medicare Other | Admitting: *Deleted

## 2011-08-02 DIAGNOSIS — I4891 Unspecified atrial fibrillation: Secondary | ICD-10-CM

## 2011-08-04 ENCOUNTER — Other Ambulatory Visit: Payer: Medicare Other

## 2011-08-04 DIAGNOSIS — R110 Nausea: Secondary | ICD-10-CM

## 2011-08-04 DIAGNOSIS — R197 Diarrhea, unspecified: Secondary | ICD-10-CM

## 2011-08-04 DIAGNOSIS — R140 Abdominal distension (gaseous): Secondary | ICD-10-CM

## 2011-08-04 DIAGNOSIS — R1032 Left lower quadrant pain: Secondary | ICD-10-CM

## 2011-08-05 ENCOUNTER — Other Ambulatory Visit: Payer: Self-pay | Admitting: Internal Medicine

## 2011-08-05 ENCOUNTER — Other Ambulatory Visit: Payer: Self-pay | Admitting: Physician Assistant

## 2011-08-05 MED ORDER — METOPROLOL SUCCINATE ER 50 MG PO TB24: 50.0000 mg | ORAL_TABLET | Freq: Every day | ORAL | Status: DC

## 2011-08-05 NOTE — Telephone Encounter (Signed)
Pt has two pills  left.

## 2011-08-08 ENCOUNTER — Other Ambulatory Visit: Payer: Self-pay | Admitting: Physician Assistant

## 2011-08-22 ENCOUNTER — Ambulatory Visit: Payer: Medicare Other | Admitting: Internal Medicine

## 2011-08-29 ENCOUNTER — Ambulatory Visit (INDEPENDENT_AMBULATORY_CARE_PROVIDER_SITE_OTHER): Payer: Medicare Other | Admitting: Pharmacist

## 2011-08-29 DIAGNOSIS — I4891 Unspecified atrial fibrillation: Secondary | ICD-10-CM

## 2011-09-01 ENCOUNTER — Telehealth: Payer: Self-pay | Admitting: Internal Medicine

## 2011-09-01 NOTE — Telephone Encounter (Signed)
Faxed over today note for holding Coumadin prior to steroid injection Patient aware

## 2011-09-01 NOTE — Telephone Encounter (Signed)
Patient would like a return call from nurse, she can reached at [phone]

## 2011-09-07 ENCOUNTER — Ambulatory Visit (INDEPENDENT_AMBULATORY_CARE_PROVIDER_SITE_OTHER): Payer: Medicare Other | Admitting: Pharmacist

## 2011-09-07 DIAGNOSIS — I4891 Unspecified atrial fibrillation: Secondary | ICD-10-CM

## 2011-09-09 ENCOUNTER — Encounter: Payer: Self-pay | Admitting: Internal Medicine

## 2011-09-09 ENCOUNTER — Ambulatory Visit (INDEPENDENT_AMBULATORY_CARE_PROVIDER_SITE_OTHER): Payer: Medicare Other | Admitting: Internal Medicine

## 2011-09-09 VITALS — BP 112/60 | HR 88 | Ht 65.5 in | Wt 171.6 lb

## 2011-09-09 DIAGNOSIS — K5792 Diverticulitis of intestine, part unspecified, without perforation or abscess without bleeding: Secondary | ICD-10-CM

## 2011-09-09 DIAGNOSIS — K5732 Diverticulitis of large intestine without perforation or abscess without bleeding: Secondary | ICD-10-CM

## 2011-09-09 DIAGNOSIS — R61 Generalized hyperhidrosis: Secondary | ICD-10-CM

## 2011-09-09 DIAGNOSIS — K219 Gastro-esophageal reflux disease without esophagitis: Secondary | ICD-10-CM

## 2011-09-09 MED ORDER — LORATADINE 10 MG PO TABS: 10.0000 mg | ORAL_TABLET | Freq: Every day | ORAL | Status: DC

## 2011-09-09 NOTE — Progress Notes (Signed)
  Subjective:    Patient ID: [name], female    DOB: 25-Apr-1934, 78 y.o.   MRN: [id number]  HPI The patient returns for followup after she was diagnosed with diverticulitis in February. She took a 10 day course of Cipro and metronidazole and the pain and bowel symptoms of loose stools greatly improved. During that visit she was also switched from pantoprazole to Prevacid and notes that she has less gas with that medication.  She reports that she has had years of chronic night sweats in that every time she is on antibiotics they go away. This occurred again. She thinks she must have some sort of low-grade infection ongoing, and so does her husband, a retired Development worker, community. She understands she cannot take antibiotics chronically.  Her husband has had deterioration in his heart failure and is being considered for left ventricular assist device.  Medications, allergies, past medical history, past surgical history, family history and social history are reviewed and updated in the EMR.   Review of Systems Ambulates with a walker, chronic fatigue dyspnea, chronic intermittent nausea    Objective:   Physical Exam General:  NAD chronically ill Eyes:   anicteric Abdomen:  soft and mildly tender to deep palpation in the left lower quadrant BS+    Data Reviewed:  February CT scan and labs as in the EMR        Assessment & Plan:   1. Diverticulitis   This is improved. She does have atypical symptoms with loose stools but seems to respond to antibiotics. The night sweats scenario is interesting, she certainly could have ongoing chronic low-grade diverticulitis but in her case with history of C. difficile and all her medical problems I don't think longer term antibiotics make sense, and we really don't know or have proof that she has ongoing chronic diverticulitis.   2. Night sweats   3. GERD (gastroesophageal reflux disease)   She is okay on the Prevacid and ranitidine at night. She is having  some nocturnal cough, she would like loratadine being refilled and she think she may be having some sinus drainage sore gone ahead and done that for her.     Cc: [name], MD

## 2011-09-09 NOTE — Patient Instructions (Addendum)
We have sent the following medications to your pharmacy for you to pick up at your convenience: Generic Claritin Follow-up as needed with [place].

## 2011-09-21 ENCOUNTER — Ambulatory Visit (INDEPENDENT_AMBULATORY_CARE_PROVIDER_SITE_OTHER): Payer: Medicare Other | Admitting: Cardiology

## 2011-09-21 DIAGNOSIS — I4891 Unspecified atrial fibrillation: Secondary | ICD-10-CM

## 2011-09-21 NOTE — Progress Notes (Signed)
Received lab results from [facility]; forwarded to Dr. Ha. 

## 2011-10-06 ENCOUNTER — Telehealth: Payer: Self-pay | Admitting: *Deleted

## 2011-10-06 NOTE — Telephone Encounter (Signed)
[facility] nurse states they have [name] scheduled for next Tuesday 10/11/11, patient was not available for INR this week

## 2011-10-12 ENCOUNTER — Ambulatory Visit: Payer: Self-pay | Admitting: Cardiology

## 2011-10-12 DIAGNOSIS — I4891 Unspecified atrial fibrillation: Secondary | ICD-10-CM

## 2011-10-12 LAB — POCT INR: INR: 3.3

## 2011-10-13 ENCOUNTER — Encounter: Payer: Medicare Other | Admitting: *Deleted

## 2011-10-20 ENCOUNTER — Encounter: Payer: Self-pay | Admitting: *Deleted

## 2011-10-21 ENCOUNTER — Telehealth: Payer: Self-pay | Admitting: Internal Medicine

## 2011-10-21 ENCOUNTER — Other Ambulatory Visit: Payer: Self-pay | Admitting: Internal Medicine

## 2011-10-21 MED ORDER — METRONIDAZOLE 500 MG PO TABS: 500.0000 mg | ORAL_TABLET | Freq: Three times a day (TID) | ORAL | Status: AC

## 2011-10-21 MED ORDER — CIPROFLOXACIN HCL 500 MG PO TABS: 500.0000 mg | ORAL_TABLET | Freq: Two times a day (BID) | ORAL | Status: AC

## 2011-10-21 NOTE — Telephone Encounter (Signed)
Patient's husband calling to report for the last 2 days, patient has had bloating and LLQ pain. She has been working hard taking care of husband who has had heart problems lately. She is having diarrhea 3-4/day. Denies fever, bleeding, nausea or vomiting. Requesting antibiotics for patient. Husband states it would be very difficult for her to come in for OV today because of his heart problems. Please, advise.

## 2011-10-21 NOTE — Telephone Encounter (Signed)
I prescribed 10 days of cipro and metronidazole She needs to get an INR checked in 5-7 days through her warfarin provider to be sure these antibiotics do not cause problems with that - could need dose of warfarin adjusted

## 2011-10-21 NOTE — Telephone Encounter (Signed)
Spoke with patient's husband and told him about rx and to have her Coumadin level checked in 5-7 days. She will do this.

## 2011-10-24 ENCOUNTER — Telehealth: Payer: Self-pay | Admitting: Internal Medicine

## 2011-10-24 NOTE — Telephone Encounter (Signed)
Too soon to say antibiotics have failed - would be best for her to have an evaluation in person and we can do when she is willing -  No new medications at this point

## 2011-10-24 NOTE — Telephone Encounter (Signed)
Patient advised.

## 2011-10-24 NOTE — Telephone Encounter (Signed)
Patient reports no improvement in her symptoms.  LLQ pain has not improved.  She denies fever.  She also reports an increase in gas and bloating and nausea.  She has hydrocodone at home, she only takes one a day and states that it doesn't help with the pain.  I have asked her to take it as needed every 4-6 hours and use Zofran ODT for nausea..  I have offered her an appt, she refuses.  Her husband just had open heart he has a new "heart pump", he can't be left alone.  Dr Leone Payor please advise.

## 2011-10-26 ENCOUNTER — Ambulatory Visit: Payer: Medicare Other | Admitting: Adult Health

## 2011-10-31 ENCOUNTER — Other Ambulatory Visit: Payer: Self-pay | Admitting: *Deleted

## 2011-10-31 ENCOUNTER — Ambulatory Visit: Payer: Self-pay | Admitting: Internal Medicine

## 2011-10-31 ENCOUNTER — Telehealth: Payer: Self-pay | Admitting: Internal Medicine

## 2011-10-31 ENCOUNTER — Ambulatory Visit: Payer: Medicare Other | Admitting: *Deleted

## 2011-10-31 ENCOUNTER — Other Ambulatory Visit: Payer: Self-pay | Admitting: Internal Medicine

## 2011-10-31 ENCOUNTER — Other Ambulatory Visit: Payer: Self-pay

## 2011-10-31 DIAGNOSIS — I4891 Unspecified atrial fibrillation: Secondary | ICD-10-CM

## 2011-10-31 DIAGNOSIS — I509 Heart failure, unspecified: Secondary | ICD-10-CM

## 2011-10-31 LAB — POCT INR: INR: 8

## 2011-10-31 LAB — PROTIME-INR: Prothrombin Time: 54.6 seconds — ABNORMAL HIGH (ref 11.6–15.2)

## 2011-10-31 MED ORDER — LANSOPRAZOLE 30 MG PO CPDR: 30.0000 mg | DELAYED_RELEASE_CAPSULE | Freq: Every day | ORAL | Status: DC

## 2011-10-31 NOTE — Telephone Encounter (Signed)
Pt requesting Prevacid 30mg  rx be [facility], running out of samples.  Husband informed that [facility] sent [facility].

## 2011-10-31 NOTE — Telephone Encounter (Signed)
I was called about a critical lab value.  Her INR was 5.92.  She is on coumadin for atrial fibrillation and was recently on cipro / flagyl.  She has completed her cipro yesterday.  She was taking 3mg  every day except for M/F (she takes 1.5 mg on those dates).  She denies any active bleeding.  I asked to her hold her coumadin for Monday, Tuesday, and Wednesday.  She will call the office and set up an INR check on either Wed or Thurdsay.   Gwendalyn Ege, MD

## 2011-11-03 ENCOUNTER — Ambulatory Visit: Payer: Self-pay | Admitting: Internal Medicine

## 2011-11-03 DIAGNOSIS — I4891 Unspecified atrial fibrillation: Secondary | ICD-10-CM

## 2011-11-03 LAB — POCT INR: INR: 2.6

## 2011-11-04 ENCOUNTER — Encounter: Payer: Self-pay | Admitting: Adult Health

## 2011-11-04 ENCOUNTER — Ambulatory Visit (INDEPENDENT_AMBULATORY_CARE_PROVIDER_SITE_OTHER): Payer: Medicare Other | Admitting: Adult Health

## 2011-11-04 ENCOUNTER — Ambulatory Visit (INDEPENDENT_AMBULATORY_CARE_PROVIDER_SITE_OTHER): Payer: Medicare Other | Admitting: *Deleted

## 2011-11-04 VITALS — BP 112/60 | HR 90 | Temp 98.8°F | Ht 63.75 in | Wt 163.2 lb

## 2011-11-04 DIAGNOSIS — J45909 Unspecified asthma, uncomplicated: Secondary | ICD-10-CM

## 2011-11-04 DIAGNOSIS — I4891 Unspecified atrial fibrillation: Secondary | ICD-10-CM

## 2011-11-04 DIAGNOSIS — Z950 Presence of cardiac pacemaker: Secondary | ICD-10-CM

## 2011-11-04 MED ORDER — AZELASTINE HCL 0.1 % NA SOLN: 2.0000 | Freq: Every day | NASAL | Status: DC

## 2011-11-04 MED ORDER — BENZONATATE 200 MG PO CAPS: 200.0000 mg | ORAL_CAPSULE | Freq: Three times a day (TID) | ORAL | Status: AC | PRN

## 2011-11-04 NOTE — Progress Notes (Signed)
Subjective:    Patient ID: [name], female    DOB: 1934/04/07, 78 y.o.   MRN: [id number]  HPI  78 yo woman, former smoker, carries hx of asthma dx in 1989 at [facility]. In retrospect probably had childhood asthma as well. Also with allergies, reported VCD (with speech rx in the past). She has been treated with bronchodilators, ICS, systemic steroids x 27 years (c/b adrenal insuff when she has stopped), on Xolair per Dr Lucie Leather - feels better on the xolair. Not on immunotherapy, has been in distant past. Currently on Spiriva + QVAR. 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 Seen at [facility] April '11 for her cough. CT scan perfomed there to eval bronchiectasis. Then referred to speech therapy [facility] , received botox UA injection with improvement in symptoms.  Has been rx agressively for GERD. She keeps a hoarse voice, a chronic cough. Presents today regarding the cough.   ROV 76/15/11 -- Hx VCD, asthma, allergies. She had Botox injections 6 weeks ago, may have been beneficial but it causes some swallowing difficulties. Still w cough, voice hoarse. Her nasal drainage has worsened since last visit, as has the cough. Started the loratadine last time, is on rhinocort, never started the NSWs, taking pantoprazole. Was never referred to speech therapy. We stopped Spiriva and she doesn't miss it (but cough remained).   ROV 76/11/12 -- returns for VCD, asthma, allergies. Tells me that she was admitted last November for ? Bronchitis, treated with abx and steroids. Her baseline pred dose is 7.5mg  qd. Stable xolair dosing w Dr Lucie Leather. Takes QVAR bid. Tells me that she has ben evaluated by Dr Felipa Eth and Dr Leone Payor for abd pain, ? Diverticular dz - course then c/b C. Diff colitis. CT scan showed a small abscess assoc with diverticular dz - currently on vanco PO + cipro/flagyl.    11/04/2011 Acute OV  Complains of increased SOB, prod cough with clear mucus primarily at night  x85months - finished zithromycin and cipro for diverticulitis 3 days ago. Not using anything for cough. Some post nasal drainage., no overt reflux, no fever or discolored mucus.  On Prevacid daily  And zantac At bedtime   Has hx of C. Diff.  Currently on Xolair but missed last month due to family illness.  Prednisone dose is 7.5mg   Not taking Spiriva any longer  Remains on QVAR Twice daily   No longer on Botox injection at voice center.  Has a lot of post nasal drip esp at night.  No fever , hemoptysis or edema.    Review of Systems As per HPI.  Constitutional:   No  weight loss, night sweats,  Fevers, chills +fatigue, or  lassitude.  HEENT:   No headaches,  Difficulty swallowing,  Tooth/dental problems, or  Sore throat,                No sneezing, itching, ear ache, + nasal congestion, post nasal drip,   CV:  No chest pain,  Orthopnea, PND, swelling in lower extremities, anasarca, dizziness, palpitations, syncope.   GI  No heartburn, indigestion, abdominal pain, nausea, vomiting, diarrhea, change in bowel habits, loss of appetite, bloody stools.   Resp   No coughing up of blood.   [name]  No chest wall deformity  Skin: no rash or lesions.  GU: no dysuria, change in color of urine, no urgency or frequency.  No flank pain, no hematuria  MS:  No joint swelling.  No decreased range of motion.    Psych:  No change in mood or affect. No depression or anxiety.  No memory loss.         Objective:   Physical Exam  Gen: Pleasant,   in no distress,  normal affect  ENT: No lesions,  mouth clear,  oropharynx clear, no postnasal drip  Neck: No JVD, no TMG, no carotid bruits  Lungs: coarse BS   Cardiovascular: RRR, heart sounds normal, no murmur or gallops, no peripheral edema  Musculoskeletal: No deformities, no cyanosis or clubbing  Neuro: alert, non focal  Skin: Warm, no lesions or rashes       Assessment & Plan:  No problem-specific assessment & plan notes found for  this encounter.

## 2011-11-04 NOTE — Patient Instructions (Signed)
May use Delsym 2 tsp Twice daily  As needed  Cough Tessalon Three times a day  As needed  Cough .  May use Zyrtec 10mg  daily for nasal drip  May use Chlorphenaramine 4mg  2 tabs At bedtime for drainage  Astelin nasal 2 puffs At bedtime   follow up Dr. Delton Coombes  In 1 month and As needed   Please contact office for sooner follow up if symptoms do not improve or worsen or seek emergency care

## 2011-11-04 NOTE — Assessment & Plan Note (Signed)
Mild flare vs VCD complicated by upper airway cough ? Triggers of AR   Plan:  May use Delsym 2 tsp Twice daily  As needed  Cough Tessalon Three times a day  As needed  Cough .  May use Zyrtec 10mg  daily for nasal drip  May use Chlorphenaramine 4mg  2 tabs At bedtime for drainage  Astelin nasal 2 puffs At bedtime   follow up Dr. Delton Coombes  In 1 month and As needed   Please contact office for sooner follow up if symptoms do not improve or worsen or seek emergency care

## 2011-11-07 LAB — REMOTE PACEMAKER DEVICE
AL AMPLITUDE: 2.8 mv
BATTERY VOLTAGE: 2.74 V
RV LEAD IMPEDENCE PM: 650 Ohm
VENTRICULAR PACING PM: 100

## 2011-11-07 NOTE — Telephone Encounter (Signed)
CMA opened another phone note. Prevacid sent to Rx.

## 2011-11-11 ENCOUNTER — Emergency Department (HOSPITAL_COMMUNITY): Payer: Medicare Other

## 2011-11-11 ENCOUNTER — Inpatient Hospital Stay (HOSPITAL_COMMUNITY)
Admission: EM | Admit: 2011-11-11 | Discharge: 2011-12-02 | DRG: 329 | Disposition: A | Discharge status: Other Acute Inpatient Hospital | Payer: Medicare Other | Attending: Internal Medicine | Admitting: Internal Medicine

## 2011-11-11 ENCOUNTER — Telehealth: Payer: Self-pay | Admitting: Internal Medicine

## 2011-11-11 ENCOUNTER — Encounter (HOSPITAL_COMMUNITY): Payer: Self-pay | Admitting: *Deleted

## 2011-11-11 DIAGNOSIS — K560 Paralytic ileus: Secondary | ICD-10-CM | POA: Diagnosis not present

## 2011-11-11 DIAGNOSIS — K668 Other specified disorders of peritoneum: Secondary | ICD-10-CM

## 2011-11-11 DIAGNOSIS — K219 Gastro-esophageal reflux disease without esophagitis: Secondary | ICD-10-CM | POA: Diagnosis present

## 2011-11-11 DIAGNOSIS — N170 Acute kidney failure with tubular necrosis: Secondary | ICD-10-CM | POA: Diagnosis present

## 2011-11-11 DIAGNOSIS — J9600 Acute respiratory failure, unspecified whether with hypoxia or hypercapnia: Secondary | ICD-10-CM | POA: Diagnosis not present

## 2011-11-11 DIAGNOSIS — J45909 Unspecified asthma, uncomplicated: Secondary | ICD-10-CM

## 2011-11-11 DIAGNOSIS — Y838 Other surgical procedures as the cause of abnormal reaction of the patient, or of later complication, without mention of misadventure at the time of the procedure: Secondary | ICD-10-CM | POA: Diagnosis present

## 2011-11-11 DIAGNOSIS — M1990 Unspecified osteoarthritis, unspecified site: Secondary | ICD-10-CM

## 2011-11-11 DIAGNOSIS — Z853 Personal history of malignant neoplasm of breast: Secondary | ICD-10-CM

## 2011-11-11 DIAGNOSIS — E871 Hypo-osmolality and hyponatremia: Secondary | ICD-10-CM | POA: Diagnosis present

## 2011-11-11 DIAGNOSIS — I509 Heart failure, unspecified: Secondary | ICD-10-CM

## 2011-11-11 DIAGNOSIS — Z8619 Personal history of other infectious and parasitic diseases: Secondary | ICD-10-CM

## 2011-11-11 DIAGNOSIS — N390 Urinary tract infection, site not specified: Secondary | ICD-10-CM | POA: Diagnosis present

## 2011-11-11 DIAGNOSIS — J95821 Acute postprocedural respiratory failure: Secondary | ICD-10-CM | POA: Diagnosis present

## 2011-11-11 DIAGNOSIS — Z950 Presence of cardiac pacemaker: Secondary | ICD-10-CM

## 2011-11-11 DIAGNOSIS — J309 Allergic rhinitis, unspecified: Secondary | ICD-10-CM

## 2011-11-11 DIAGNOSIS — R791 Abnormal coagulation profile: Secondary | ICD-10-CM | POA: Diagnosis present

## 2011-11-11 DIAGNOSIS — T81329A Deep disruption or dehiscence of operation wound, unspecified, initial encounter: Secondary | ICD-10-CM | POA: Diagnosis not present

## 2011-11-11 DIAGNOSIS — G4700 Insomnia, unspecified: Secondary | ICD-10-CM

## 2011-11-11 DIAGNOSIS — Z8679 Personal history of other diseases of the circulatory system: Secondary | ICD-10-CM

## 2011-11-11 DIAGNOSIS — I9589 Other hypotension: Secondary | ICD-10-CM | POA: Diagnosis present

## 2011-11-11 DIAGNOSIS — C801 Malignant (primary) neoplasm, unspecified: Secondary | ICD-10-CM

## 2011-11-11 DIAGNOSIS — T45515A Adverse effect of anticoagulants, initial encounter: Secondary | ICD-10-CM | POA: Diagnosis present

## 2011-11-11 DIAGNOSIS — D518 Other vitamin B12 deficiency anemias: Secondary | ICD-10-CM

## 2011-11-11 DIAGNOSIS — C50919 Malignant neoplasm of unspecified site of unspecified female breast: Secondary | ICD-10-CM

## 2011-11-11 DIAGNOSIS — E46 Unspecified protein-calorie malnutrition: Secondary | ICD-10-CM | POA: Diagnosis present

## 2011-11-11 DIAGNOSIS — R100 Acute abdomen: Secondary | ICD-10-CM

## 2011-11-11 DIAGNOSIS — G8929 Other chronic pain: Secondary | ICD-10-CM

## 2011-11-11 DIAGNOSIS — K650 Generalized (acute) peritonitis: Secondary | ICD-10-CM | POA: Diagnosis present

## 2011-11-11 DIAGNOSIS — J449 Chronic obstructive pulmonary disease, unspecified: Secondary | ICD-10-CM

## 2011-11-11 DIAGNOSIS — K631 Perforation of intestine (nontraumatic): Secondary | ICD-10-CM

## 2011-11-11 DIAGNOSIS — F0390 Unspecified dementia without behavioral disturbance: Secondary | ICD-10-CM

## 2011-11-11 DIAGNOSIS — K5732 Diverticulitis of large intestine without perforation or abscess without bleeding: Secondary | ICD-10-CM

## 2011-11-11 DIAGNOSIS — R195 Other fecal abnormalities: Secondary | ICD-10-CM

## 2011-11-11 DIAGNOSIS — K5780 Diverticulitis of intestine, part unspecified, with perforation and abscess without bleeding: Secondary | ICD-10-CM | POA: Diagnosis present

## 2011-11-11 DIAGNOSIS — K651 Peritoneal abscess: Secondary | ICD-10-CM

## 2011-11-11 DIAGNOSIS — I959 Hypotension, unspecified: Secondary | ICD-10-CM | POA: Diagnosis present

## 2011-11-11 DIAGNOSIS — R109 Unspecified abdominal pain: Secondary | ICD-10-CM

## 2011-11-11 DIAGNOSIS — K630 Abscess of intestine: Secondary | ICD-10-CM

## 2011-11-11 DIAGNOSIS — Z7901 Long term (current) use of anticoagulants: Secondary | ICD-10-CM

## 2011-11-11 DIAGNOSIS — K659 Peritonitis, unspecified: Secondary | ICD-10-CM | POA: Diagnosis present

## 2011-11-11 DIAGNOSIS — J4489 Other specified chronic obstructive pulmonary disease: Secondary | ICD-10-CM | POA: Diagnosis present

## 2011-11-11 DIAGNOSIS — J383 Other diseases of vocal cords: Secondary | ICD-10-CM

## 2011-11-11 DIAGNOSIS — IMO0002 Reserved for concepts with insufficient information to code with codable children: Secondary | ICD-10-CM

## 2011-11-11 DIAGNOSIS — E039 Hypothyroidism, unspecified: Secondary | ICD-10-CM | POA: Diagnosis present

## 2011-11-11 DIAGNOSIS — T8132XA Disruption of internal operation (surgical) wound, not elsewhere classified, initial encounter: Secondary | ICD-10-CM | POA: Diagnosis not present

## 2011-11-11 DIAGNOSIS — I10 Essential (primary) hypertension: Secondary | ICD-10-CM | POA: Diagnosis present

## 2011-11-11 DIAGNOSIS — D500 Iron deficiency anemia secondary to blood loss (chronic): Secondary | ICD-10-CM

## 2011-11-11 DIAGNOSIS — I498 Other specified cardiac arrhythmias: Secondary | ICD-10-CM

## 2011-11-11 DIAGNOSIS — I4891 Unspecified atrial fibrillation: Secondary | ICD-10-CM | POA: Diagnosis present

## 2011-11-11 DIAGNOSIS — I5022 Chronic systolic (congestive) heart failure: Secondary | ICD-10-CM | POA: Diagnosis present

## 2011-11-11 HISTORY — DX: Palmar fascial fibromatosis (dupuytren): M72.0

## 2011-11-11 LAB — URINALYSIS, MICROSCOPIC ONLY
Glucose, UA: NEGATIVE mg/dL
Ketones, ur: NEGATIVE mg/dL
Leukocytes, UA: NEGATIVE
pH: 5.5 (ref 5.0–8.0)

## 2011-11-11 LAB — COMPREHENSIVE METABOLIC PANEL
Alkaline Phosphatase: 64 U/L (ref 39–117)
BUN: 23 mg/dL (ref 6–23)
CO2: 23 mEq/L (ref 19–32)
GFR calc Af Amer: 64 mL/min — ABNORMAL LOW (ref >90)
GFR calc non Af Amer: 55 mL/min — ABNORMAL LOW (ref >90)
Glucose, Bld: 86 mg/dL (ref 70–99)
Potassium: 4 mEq/L (ref 3.5–5.1)
Total Bilirubin: 0.4 mg/dL (ref 0.3–1.2)
Total Protein: 6.6 g/dL (ref 6.0–8.3)

## 2011-11-11 LAB — CBC
HCT: 36.8 % (ref 36.0–46.0)
Hemoglobin: 11.9 g/dL — ABNORMAL LOW (ref 12.0–15.0)
MCHC: 32.3 g/dL (ref 30.0–36.0)
RBC: 4 MIL/uL (ref 3.87–5.11)

## 2011-11-11 LAB — PROCALCITONIN: Procalcitonin: 0.94 ng/mL

## 2011-11-11 LAB — BLOOD GAS, VENOUS
O2 Saturation: 56.3 %
Patient temperature: 98.6

## 2011-11-11 LAB — LACTIC ACID, PLASMA: Lactic Acid, Venous: 1.4 mmol/L (ref 0.5–2.2)

## 2011-11-11 LAB — TYPE AND SCREEN: ABO/RH(D): A POS

## 2011-11-11 LAB — PROTIME-INR
INR: 3.29 — ABNORMAL HIGH (ref 0.00–1.49)
INR: 1.99 — ABNORMAL HIGH (ref 0.00–1.49)

## 2011-11-11 MED ORDER — PIPERACILLIN-TAZOBACTAM 3.375 G IVPB
3.3750 g | Freq: Once | INTRAVENOUS | Status: AC
  Administered 2011-11-11: 3.375 g via INTRAVENOUS
  Filled 2011-11-11: qty 50

## 2011-11-11 MED ORDER — SODIUM CHLORIDE 0.9 % IV SOLN
80.0000 mg | Freq: Once | INTRAVENOUS | Status: AC
  Administered 2011-11-11: 80 mg via INTRAVENOUS
  Filled 2011-11-11: qty 80

## 2011-11-11 MED ORDER — MORPHINE SULFATE 4 MG/ML IJ SOLN
4.0000 mg | Freq: Once | INTRAMUSCULAR | Status: AC
  Administered 2011-11-11: 4 mg via INTRAVENOUS
  Filled 2011-11-11: qty 1

## 2011-11-11 MED ORDER — DILTIAZEM HCL 100 MG IV SOLR
5.0000 mg/h | Freq: Once | INTRAVENOUS | Status: AC
  Administered 2011-11-11: 5 mg/h via INTRAVENOUS
  Filled 2011-11-11: qty 100

## 2011-11-11 MED ORDER — DEXTROSE 5 % IV SOLN
1.0000 mg/min | INTRAVENOUS | Status: DC
  Filled 2011-11-11: qty 9

## 2011-11-11 MED ORDER — SODIUM CHLORIDE 0.9 % IV SOLN
Freq: Once | INTRAVENOUS | Status: AC
  Administered 2011-11-12: 08:00:00 via INTRAVENOUS

## 2011-11-11 MED ORDER — HYDROCORTISONE SOD SUCCINATE 100 MG IJ SOLR
50.0000 mg | Freq: Four times a day (QID) | INTRAMUSCULAR | Status: DC
  Administered 2011-11-11 – 2011-11-12 (×2): 50 mg via INTRAVENOUS
  Filled 2011-11-11 (×2): qty 1
  Filled 2011-11-11: qty 2
  Filled 2011-11-11: qty 1
  Filled 2011-11-11: qty 2
  Filled 2011-11-11: qty 1

## 2011-11-11 MED ORDER — AMIODARONE HCL IN DEXTROSE 360-4.14 MG/200ML-% IV SOLN
30.0000 mg/h | INTRAVENOUS | Status: DC
  Administered 2011-11-12 – 2011-11-18 (×10): 30 mg/h via INTRAVENOUS
  Filled 2011-11-11 (×4): qty 200
  Filled 2011-11-11: qty 1000
  Filled 2011-11-11 (×8): qty 200

## 2011-11-11 MED ORDER — ONDANSETRON HCL 4 MG/2ML IJ SOLN
INTRAMUSCULAR | Status: AC
  Administered 2011-11-11: 10:00:00
  Filled 2011-11-11: qty 2

## 2011-11-11 MED ORDER — DEXTROSE 5 % IV SOLN
150.0000 mg | Freq: Once | INTRAVENOUS | Status: DC
  Filled 2011-11-11: qty 3

## 2011-11-11 MED ORDER — FENTANYL CITRATE 0.05 MG/ML IJ SOLN
25.0000 ug | INTRAMUSCULAR | Status: DC | PRN
  Administered 2011-11-11: 25 ug via INTRAVENOUS
  Filled 2011-11-11 (×2): qty 2

## 2011-11-11 MED ORDER — AMIODARONE HCL IN DEXTROSE 360-4.14 MG/200ML-% IV SOLN
60.0000 mg/h | INTRAVENOUS | Status: AC
  Administered 2011-11-11: 60 mg/h via INTRAVENOUS
  Filled 2011-11-11: qty 200

## 2011-11-11 MED ORDER — PIPERACILLIN-TAZOBACTAM 3.375 G IVPB
3.3750 g | Freq: Three times a day (TID) | INTRAVENOUS | Status: DC
  Administered 2011-11-11 – 2011-11-21 (×28): 3.375 g via INTRAVENOUS
  Filled 2011-11-11 (×31): qty 50

## 2011-11-11 MED ORDER — IOHEXOL 300 MG/ML  SOLN
100.0000 mL | Freq: Once | INTRAMUSCULAR | Status: AC | PRN
  Administered 2011-11-11: 100 mL via INTRAVENOUS

## 2011-11-11 MED ORDER — VITAMIN K1 10 MG/ML IJ SOLN
5.0000 mg | Freq: Once | INTRAVENOUS | Status: AC
  Administered 2011-11-11: 5 mg via INTRAVENOUS
  Filled 2011-11-11: qty 0.5

## 2011-11-11 MED ORDER — VANCOMYCIN HCL IN DEXTROSE 1-5 GM/200ML-% IV SOLN
1000.0000 mg | Freq: Once | INTRAVENOUS | Status: AC
  Administered 2011-11-11: 1000 mg via INTRAVENOUS
  Filled 2011-11-11: qty 200

## 2011-11-11 MED ORDER — AMIODARONE IV BOLUS ONLY 150 MG/100ML
150.0000 mg | Freq: Once | INTRAVENOUS | Status: AC
  Administered 2011-11-11: 150 mg via INTRAVENOUS
  Filled 2011-11-11: qty 100

## 2011-11-11 MED ORDER — SODIUM CHLORIDE 0.9 % IV BOLUS (SEPSIS)
2000.0000 mL | Freq: Once | INTRAVENOUS | Status: AC
  Administered 2011-11-11: 1000 mL via INTRAVENOUS

## 2011-11-11 MED ORDER — ACETAMINOPHEN 650 MG RE SUPP
975.0000 mg | Freq: Once | RECTAL | Status: AC
  Administered 2011-11-11: 975 mg via RECTAL
  Filled 2011-11-11: qty 1

## 2011-11-11 MED ORDER — ACETAMINOPHEN 650 MG RE SUPP
650.0000 mg | Freq: Four times a day (QID) | RECTAL | Status: DC | PRN
  Administered 2011-11-11: 650 mg via RECTAL
  Filled 2011-11-11: qty 1

## 2011-11-11 MED ORDER — PHYTONADIONE 5 MG PO TABS
5.0000 mg | ORAL_TABLET | Freq: Once | ORAL | Status: DC
  Filled 2011-11-11: qty 1

## 2011-11-11 NOTE — ED Notes (Signed)
Attempting to remove duplicate documentation of IV insertion.

## 2011-11-11 NOTE — ED Notes (Signed)
MD at bedside. 

## 2011-11-11 NOTE — ED Provider Notes (Signed)
5:24 PM PCCM called.  Will admit the patient.  FFP now.  Vitamin K now.  General surgery states they will be taken this patient in the operating room later today   Lyanne Co, MD 11/11/11 1725

## 2011-11-11 NOTE — ED Notes (Signed)
Pt Alert and oriented without complaints.

## 2011-11-11 NOTE — Consult Note (Signed)
Reason for Consult: Perforated Diverticulitis Referring Physician: Patria Mane (ER-MD) P[facility]M- [facility] - [facility] C[facility] - Sonna Lipsky [name] is an 78 y.o. female.  HPI: Patient is an extremely ill 78 year old woman with a two-year history of diverticulitis/diverticulosis. He's been on multiple regimes of antibiotic therapy. She reports getting increased abdominal pain, 11/09/11 which is  normally in the left lower quadrant. Become significantly worse and she presented to the ER at [facility] with significant mid abdominal pain. Labs show a white count of 13,500, pH 7.34, troponin is negative. Acute abdominal and chest films shows nonobstructive bowel pattern low lung volumes both lungs were clear. Permanent transvenous pacemaker dual lead. CT scan shows a significant amount of intraperitoneal air large amount of inflammation in the pelvis with extraluminal air fluid collection. Associated with the sigmoid colon and related to acute sigmoid diverticulitis the is a complex abscess collection in the anterior pelvis and probable earlier abscess in the posterior pelvis. Patient has an INR of 3.29, she is in atrial fibrillation with a rate in the 120s. We were asked to the patient in consultation. Medicine plans to admit her, and her in the family's request ask [place] cardiology to see also.  Past Medical History  Diagnosis Asthma/COPD/20 year history of tobacco use. Atrial fibrillation with permanent transvenous pacemaker Possible history of heart failure Hypertension Hypothyroid Diverticulitis, recurrent with pain for 2 years./Recent bout of C. 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 Insomnia, unspecified   . Osteoarthrosis, unspecified whether generalized or localized, unspecified site   . Unspecified hypothyroidism   . Unspecified essential hypertension     . Atrial fibrillation   . Sciatica   . Anemia   . COPD (chronic obstructive pulmonary disease)   . UTI (urinary tract infection)   . GERD (gastroesophageal reflux disease)   . Pneumonia   . Asthma   . Clostridium difficile colitis   . Diverticulitis of sigmoid colon 11/2010    with abscess/contained perforation  . Dementia     mild  . Dehydration with hyponatremia 12/2010    hospitalized  . Angiodysplasia of stomach     ablated 2010  . Angiodysplasia of colon     ablated 2010  . Vitamin B12 deficiency (dietary) anemia 04/12/2011  . Shingles   . Sarcoma     right hand  . Malignant neoplasm of breast (female), unspecified site     left  . Dupuytren's disease     left hand    Past Surgical History  Procedure Date  . Nasal sinus surgery     x 3  . Appendectomy   . Hernia repair   . Abdominal hysterectomy   . Pacemaker insertion   . Eye surgery     x6-bilateral  . Finger amputation     right  . Foot surgery     right toes x 3  . Tonsillectomy   . Colonoscopy 01/2009    w/ablation of 3 mm AVM, diverticulosis, external hemorrhoids  . Upper gastrointestinal endoscopy 01/2009    w/polypectomy, AVM, hyperplastic polyp  . Abdominal hysterectomy     Family History  Problem Relation Age of Onset  . Emphysema Paternal Uncle   . Heart disease Mother   . Colon cancer Mother   . Pancreatic cancer Father   . Tuberculosis Paternal Uncle   . Breast cancer Maternal Aunt   .  Breast cancer Cousin   . Prostate cancer Cousin   . Colon cancer Cousin   . Uterine cancer Cousin     Social History:  reports that she quit smoking about 34 years ago. She has never used smokeless tobacco. She reports that she drinks alcohol. She reports that she does not use illicit drugs.  Allergies:  Allergies  Allergen Reactions  . Aspirin   . Ibuprofen   . Iron Dextran     REACTION: itching after test dose  . Metoclopramide Hcl     REACTION: chomping mouth  . Sulfonamide Derivatives      Medications:  Prior to Admission:  (Not in a hospital admission) Scheduled:   . sodium chloride   Intravenous Once  . acetaminophen  975 mg Rectal Once  . diltiazem (CARDIZEM) infusion  5-15 mg/hr Intravenous Once  .  morphine injection  4 mg Intravenous Once  . ondansetron      . phytonadione (VITAMIN K) IV  5 mg Intravenous Once  . piperacillin-tazobactam (ZOSYN)  IV  3.375 g Intravenous Once  . sodium chloride  2,000 mL Intravenous Once  . vancomycin  1,000 mg Intravenous Once  . DISCONTD: phytonadione  5 mg Oral Once   Continuous:  AVW:[id number] Anti-infectives     Start     Dose/Rate Route Frequency Ordered Stop   11/11/11 1130   piperacillin-tazobactam (ZOSYN) IVPB 3.375 g        3.375 g 100 mL/hr over 30 Minutes Intravenous  Once 11/11/11 1050 11/11/11 1200   11/11/11 1130   vancomycin (VANCOCIN) IVPB 1000 mg/200 mL premix        1,000 mg 200 mL/hr over 60 Minutes Intravenous  Once 11/11/11 1050 11/11/11 1341          Results for orders placed during the hospital encounter of 11/11/11 (from the past 48 hour(s))  CBC     Status: Abnormal   Collection Time   11/11/11 10:35 AM      Component Value Range Comment   WBC 13.5 (*) 4.0 - 10.5 (K/uL)    RBC 4.00  3.87 - 5.11 (MIL/uL)    Hemoglobin 11.9 (*) 12.0 - 15.0 (g/dL)    HCT 47.8  29.5 - 62.1 (%)    MCV 92.0  78.0 - 100.0 (fL)    MCH 29.8  26.0 - 34.0 (pg)    MCHC 32.3  30.0 - 36.0 (g/dL)    RDW 30.8  65.7 - 84.6 (%)    Platelets 262  150 - 400 (K/uL)   COMPREHENSIVE METABOLIC PANEL     Status: Abnormal   Collection Time   11/11/11 10:35 AM      Component Value Range Comment   Sodium 131 (*) 135 - 145 (mEq/L)    Potassium 4.0  3.5 - 5.1 (mEq/L)    Chloride 97  96 - 112 (mEq/L)    CO2 23  19 - 32 (mEq/L)    Glucose, Bld 86  70 - 99 (mg/dL)    BUN 23  6 - 23 (mg/dL)    Creatinine, Ser 9.62  0.50 - 1.10 (mg/dL)    Calcium 9.2  8.4 - 10.5 (mg/dL)    Total Protein 6.6  6.0 - 8.3 (g/dL)    Albumin 2.9 (*)  3.5 - 5.2 (g/dL)    AST 15  0 - 37 (U/L)    ALT 9  0 - 35 (U/L)    Alkaline Phosphatase 64  39 - 117 (U/L)  Total Bilirubin 0.4  0.3 - 1.2 (mg/dL)    GFR calc non Af Amer 55 (*) >90 (mL/min)    GFR calc Af Amer 64 (*) >90 (mL/min)   LACTIC ACID, PLASMA     Status: Normal   Collection Time   11/11/11 10:35 AM      Component Value Range Comment   Lactic Acid, Venous 1.4  0.5 - 2.2 (mmol/L)   TROPONIN I     Status: Normal   Collection Time   11/11/11 10:35 AM      Component Value Range Comment   Troponin I <0.30  <0.30 (ng/mL)   PROTIME-INR     Status: Abnormal   Collection Time   11/11/11 10:35 AM      Component Value Range Comment   Prothrombin Time 34.0 (*) 11.6 - 15.2 (seconds)    INR 3.29 (*) 0.00 - 1.49    PROCALCITONIN     Status: Normal   Collection Time   11/11/11 10:35 AM      Component Value Range Comment   Procalcitonin 0.94     BLOOD GAS, VENOUS     Status: Abnormal   Collection Time   11/11/11 10:55 AM      Component Value Range Comment   pH, Ven 7.346 (*) 7.250 - 7.300     pCO2, Ven 48.6  45.0 - 50.0 (mmHg)    pO2, Ven 32.4  30.0 - 45.0 (mmHg)    Bicarbonate 25.9 (*) 20.0 - 24.0 (mEq/L)    TCO2 22.3  0 - 100 (mmol/L)    Acid-base deficit 0.2  0.0 - 2.0 (mmol/L)    O2 Saturation 56.3      Patient temperature 98.6      Drawn by VEIN      Sample type VEIN     URINALYSIS, WITH MICROSCOPIC     Status: Normal   Collection Time   11/11/11 11:15 AM      Component Value Range Comment   Color, Urine YELLOW  YELLOW     APPearance CLEAR  CLEAR     Specific Gravity, Urine 1.023  1.005 - 1.030     pH 5.5  5.0 - 8.0     Glucose, UA NEGATIVE  NEGATIVE (mg/dL)    Hgb urine dipstick NEGATIVE  NEGATIVE     Bilirubin Urine NEGATIVE  NEGATIVE     Ketones, ur NEGATIVE  NEGATIVE (mg/dL)    Protein, ur NEGATIVE  NEGATIVE (mg/dL)    Urobilinogen, UA 0.2  0.0 - 1.0 (mg/dL)    Nitrite NEGATIVE  NEGATIVE     Leukocytes, UA NEGATIVE  NEGATIVE     WBC, UA 0-2  <3 (WBC/hpf)    RBC  / HPF 0-2  <3 (RBC/hpf)    Bacteria, UA RARE  RARE     Squamous Epithelial / LPF RARE  RARE      Ct Abdomen Pelvis W Contrast  11/11/2011  *RADIOLOGY REPORT*  Clinical Data: Fever and abdominal pain.  CT ABDOMEN AND PELVIS WITH CONTRAST  Technique:  Multidetector CT imaging of the abdomen and pelvis was performed following the standard protocol during bolus administration of intravenous contrast.  Contrast: OMNIPAQUE IOHEXOL 300 MG/ML  SOLN  Comparison: Abdominal CT 07/28/2011  Findings: Lung bases again demonstrate scarring along the right major fissure at the right lung base.  The patient has a large amount of free intraperitoneal air.  There is mesenteric edema and inflammatory changes centered around the mid and left anterior  pelvis.  There are multiple locules of extraluminal gas in this location which appear to be adjacent to the sigmoid colon.  The gas collection roughly measures 3.5 x 3.7 cm.  There is an adjacent air- fluid collection that roughly measures 3.6 x 2.0 cm on sequence 6, image 53.  There is gas within the adjacent urinary bladder but this is probably related to the Foley catheter.  There is slightly complex fluid in the pelvic cul-de-sac which could also represent an abscess formation.  Again noted are scattered low density areas throughout the liver which are probably associated with cysts and possibly hemangiomas. No gross abnormality to the portal venous system or gallbladder. Again noted are calcifications within the spleen.  There is increased perinephric edema bilaterally.  Again noted is a hyperdense exophytic structure along the upper pole of the left kidney that roughly measures 1.2 cm.  This structure  measured 1.0 cm in 2006 and probably represents a hyperdense cyst.  3.4 cm low density left renal cyst.  Again seen is a 8 mm low density structure in the posterior right kidney which could represent a small angiolipoma.  No gross abnormality to the adrenal glands or  pancreas.  Severe degenerative changes in the lumbar spine with marked anterolisthesis at L5-S1.  These findings are similar to the prior examination.  IMPRESSION: The study is positive for free intraperitoneal air.  Large amount of inflammation in the pelvis with extraluminal air-fluid collections.  The inflammation appears to be associated with the sigmoid colon and related to acute sigmoid diverticulitis.  There are complex abscess collections in the anterior pelvis and suspect early abscess formation in the posterior pelvis.  Multiple low-density structures in the liver are suggestive for cysts and possibly hemangiomas.  Stable hyperdense left renal cyst.  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  These results were called by telephone on 11/11/2011  at  3:44 p.m. to  Dr. Rubin Payor., who verbally acknowledged these results.  Original Report Authenticated By: Richarda Overlie, M.D.   Dg Abd Acute W/chest  11/11/2011  *RADIOLOGY REPORT*  Clinical Data: Left lower quadrant pain.  Nausea and vomiting.  ACUTE ABDOMEN SERIES (ABDOMEN 2 VIEW & CHEST 1 VIEW)  Comparison: Chest radiograph on 01/12/2011  Findings: Several nondilated small bowel loops are seen which contain air fluid levels in the pelvis.  No dilated bowel loops identified.  No evidence of free air.  Low lung volumes are again seen, however both lungs are clear. Heart size is within normal limits.  Dual lead transvenous pacemaker remains in appropriate position.  Surgical clips again noted in the left chest wall.  IMPRESSION:  1.  Nonspecific, nonobstructive bowel gas pattern. 2.  No active cardiopulmonary disease.  Original Report Authenticated By: Danae Orleans, M.D.    Review of Systems  Constitutional: Negative.   HENT: Negative.   Eyes: Negative.   Respiratory: Positive for cough (chronic dry cough, has broken ribs coughing .), shortness of breath and wheezing. Negative for hemoptysis and sputum production.        Chronic SOB   Cardiovascular: Positive for leg swelling. Negative for chest pain, orthopnea and PND.       Hx of CHF, Afib with PTVP  Gastrointestinal: Positive for heartburn, nausea, abdominal pain (chronic pain with diverticulitis for 2 years), diarrhea and constipation. Negative for vomiting and blood in stool.  Genitourinary: Negative.        Urine output is down  Musculoskeletal: Positive for myalgias, back pain (chronic  back pain  take hydrocodone in [place] to get to [place].) and joint pain. Negative for falls.  Skin: Negative.   Neurological: Negative.   Endo/Heme/Allergies: Bruises/bleeds easily.  Psychiatric/Behavioral: Positive for depression and memory loss (she has dementia listed but denies it.). The patient is nervous/anxious.    Blood pressure 111/54, pulse 124, temperature 99.8 F (37.7 C), temperature source Oral, resp. rate 22, SpO2 97.00%. Physical Exam  Constitutional: She is oriented to person, place, and time.       Acutely ill WF, VSS 115/56.  HR 121 AF.  HENT:  Head: Normocephalic and atraumatic.  Eyes: Conjunctivae and EOM are normal. Pupils are equal, round, and reactive to light. Right eye exhibits no discharge. Left eye exhibits no discharge. No scleral icterus.  Neck: Normal range of motion. Neck supple. No JVD present. No tracheal deviation present. No thyromegaly present.  Cardiovascular: Normal rate, regular rhythm, normal heart sounds and intact distal pulses.  Exam reveals no gallop.   No murmur heard. Respiratory: Effort normal and breath sounds normal. No stridor. No respiratory distress. She has no wheezes. She has no rales. She exhibits no tenderness.  GI: She exhibits distension. She exhibits no mass. There is tenderness. There is rebound and guarding.  Musculoskeletal: She exhibits no edema and no tenderness.  Lymphadenopathy:    She has no cervical adenopathy.  Neurological: She is alert and oriented to person, place, and time. She has normal reflexes. No cranial nerve  deficit.  Skin: Skin is warm and dry.       Allot of brusing/ecchymosis  Psychiatric: She has a normal mood and affect. Her behavior is normal. Judgment and thought content normal.    Assessment/Plan: 1. Sigmoid diverticulitis with perforation. 2. Atrial fibrillation on chronic Coumadin; therapeutic and will require reversal. 3. Permanent transvenous pacemaker/possible history of congestive heart failure. 4. COPD and asthma with chronic shortness of breath. 5. Hypertension 6. Hypothyroid 7. Chronic back pain 8. History of left breast cancer 9. History of Dupuytren's contractures/right-handed sarcoma. 10. Status post umbilical hernia repair/status post appendectomy/abdominal hysterectomy  Plan: She has been seen and evaluated by Dr. Luisa Hart. He is concerned she requires surgery tonight. Medicine is planning to admit, after Patria Mane has discussed this with her service. She will need her INR corrected, her cardiac and pulmonary issues stabilized. Dr. Biagio Quint is on call tonight, he will see her and evaluate later this evening. I have called [place] cardiology at the family's request.  [facility] physician assistant for Dr. Harriette Bouillon       Jessie Cowher 11/11/2011, 5:18 PM

## 2011-11-11 NOTE — Consult Note (Signed)
Pt seen and examined.  Chart reviewed.  Perforated sigmoid diverticulitis.  Needs ex lap once medical issues addressed. Dr Biagio Quint on call. He will follow up and see pt.  Discussed with Dr Biagio Quint.

## 2011-11-11 NOTE — ED Provider Notes (Addendum)
History     CSN: [id number]  Arrival date & time 11/11/11  4098   First MD Initiated Contact with Patient 11/11/11 [phone]      Chief Complaint  Patient presents with  . Fever     The history is provided by the patient.   the history is obtained from the patient and the patient's spouse.  Increasing abdominal pain and fever for several days with altered level of consciousness developing over the past 48 hours.  Pt with nausea and vomiting.   One episode of diarrhea.  No melena or hematochezia.  Was treated several weeks ago for diverticulitis.  Her antibiotics were ciprofloxacin and metronidazole which was started on May 3 she was given a 10 day course.  She reports that her abdominal pain never really improved after that.   No recent falls or trauma.  She is on Coumadin for history of atrial fibrillation  Past Medical History  Diagnosis Date  . Sarcoma     right hand  . Insomnia, unspecified   . Osteoarthrosis, unspecified whether generalized or localized, unspecified site   . Unspecified hypothyroidism   . Unspecified essential hypertension   . Atrial fibrillation   . Sciatica   . Malignant neoplasm of breast (female), unspecified site   . Anemia   . COPD (chronic obstructive pulmonary disease)   . UTI (urinary tract infection)   . GERD (gastroesophageal reflux disease)   . Pneumonia   . Asthma   . Clostridium difficile colitis   . Diverticulitis of sigmoid colon 11/2010    with abscess/contained perforation  . Dementia     mild  . Dehydration with hyponatremia 12/2010    hospitalized  . Angiodysplasia of stomach     ablated 2010  . Angiodysplasia of colon     ablated 2010  . Vitamin B12 deficiency (dietary) anemia 04/12/2011  . Shingles     Past Surgical History  Procedure Date  . Nasal sinus surgery     x 3  . Appendectomy   . Hernia repair   . Abdominal hysterectomy   . Pacemaker insertion   . Eye surgery     x6-bilateral  . Finger amputation     right  .  Foot surgery     right toes x 3  . Tonsillectomy   . Colonoscopy 01/2009    w/ablation of 3 mm AVM, diverticulosis, external hemorrhoids  . Upper gastrointestinal endoscopy 01/2009    w/polypectomy, AVM, hyperplastic polyp    Family History  Problem Relation Age of Onset  . Emphysema Paternal Uncle   . Heart disease Mother   . Colon cancer Mother   . Pancreatic cancer Father   . Tuberculosis Paternal Uncle   . Breast cancer Maternal Aunt   . Breast cancer Cousin   . Prostate cancer Cousin   . Colon cancer Cousin   . Uterine cancer Cousin     History  Substance Use Topics  . Smoking status: Former Smoker -- 0.5 packs/day for 21 years    Quit date: 06/20/1977  . Smokeless tobacco: Never Used  . Alcohol Use: Yes     1 glass wine/night    OB History    Grav Para Term Preterm Abortions TAB SAB Ect Mult Living                  Review of Systems  Constitutional: Positive for fever.  All other systems reviewed and are negative.    Allergies  Aspirin; Ibuprofen;  Iron dextran; Metoclopramide hcl; and Sulfonamide derivatives  Home Medications   Current Outpatient Rx  Name Route Sig Dispense Refill  . ALBUTEROL SULFATE HFA 108 (90 BASE) MCG/ACT IN AERS Inhalation Inhale 2 puffs into the lungs every 6 (six) hours as needed. For shortness of breath.    . AZELASTINE HCL 137 MCG/SPRAY NA SOLN Nasal Place 2 sprays into the nose at bedtime. Use in each nostril as directed 30 mL 12  . BECLOMETHASONE DIPROPIONATE 80 MCG/ACT IN AERS Inhalation Inhale 2 puffs into the lungs 2 (two) times daily.    [name] BENZONATATE 200 MG PO CAPS Oral Take 1 capsule (200 mg total) by mouth 3 (three) times daily as needed for cough. 30 capsule 1  . BUDESONIDE 32 MCG/ACT NA SUSP Nasal Place 1 spray into the nose daily.     [name] CANDESARTAN CILEXETIL 16 MG PO TABS Oral Take 16 mg by mouth daily.      [name] VITAMIN D3 50000 UNITS PO CAPS Oral Take 1 tablet by mouth every 7 (seven) days. Taken on Sundays.    .  CYCLOBENZAPRINE HCL 10 MG PO TABS Oral Take 10 mg by mouth 2 (two) times daily as needed. For pain.    [name] DOCUSATE SODIUM 100 MG PO CAPS Oral Take 100 mg by mouth 2 (two) times daily as needed. For stool softener.    . DONEPEZIL HCL 10 MG PO TABS Oral Take 1 tablet (10 mg total) by mouth at bedtime. 30 tablet 0  . DULOXETINE HCL 30 MG PO CPEP Oral Take 30 mg by mouth 2 (two) times daily.     [name] ESZOPICLONE 3 MG PO TABS Oral Take 3 mg by mouth at bedtime. Lunesta     . FUROSEMIDE 20 MG PO TABS Oral Take 20 mg by mouth daily as needed. For fluid retention.    [name] HYDROCODONE-ACETAMINOPHEN 10-325 MG PO TABS Oral Take 1 tablet by mouth every 6 (six) hours as needed. For pain.    [name] LANSOPRAZOLE 30 MG PO CPDR Oral Take 1 capsule (30 mg total) by mouth daily. 90 capsule 3  . LEVOTHYROXINE SODIUM 75 MCG PO TABS Oral Take 75 mcg by mouth See admin instructions. Take 1 every day except Monday and Friday    . LEVOTHYROXINE SODIUM 50 MCG PO TABS Oral Take 50 mcg by mouth See admin instructions. Take 1 tab on Monday and Friday    . LOPERAMIDE HCL 2 MG PO CHEW  1/2-1 tablet as needed to prevent diarrhea 2 tablet   . MELOXICAM 15 MG PO TABS Oral Take 15 mg by mouth daily.      [name] METOPROLOL SUCCINATE ER 50 MG PO TB24  TAKE 1 TABLET BY MOUTH EVERY DAY 90 tablet 3  . NYSTATIN 100000 UNIT/ML MT SUSP Oral Take 500,000 Units by mouth 3 (three) times daily as needed. For thrush.    . OMALIZUMAB 150 MG [place] SOLR Subcutaneous Inject into the skin every 28 (twenty-eight) days. Both arms    . ONDANSETRON 4 MG PO TBDP  Take 1 tab on the tongue every 6 hours as needed for nausea. 40 tablet 0  . PREDNISONE 5 MG PO TABS Oral Take 7.5 mg by mouth daily.     [name] RANITIDINE HCL 150 MG PO TABS Oral Take 300 mg by mouth daily.    [name] SIMVASTATIN 10 MG PO TABS Oral Take 10 mg by mouth at bedtime.      . TRAZODONE HCL 100 MG PO TABS Oral  Take 100 mg by mouth at bedtime. For sleep.    . WARFARIN SODIUM 3 MG PO TABS Oral Take 3 mg by mouth See  admin instructions. 1 tab daily except for 1.5 tab daily on Mondays and Fridays.    [name] ZAFIRLUKAST 20 MG PO TABS Oral Take 20 mg by mouth 2 (two) times daily.     [name] EPINEPHRINE 0.3 MG/0.3ML IJ DEVI Intramuscular Inject 0.3 mg into the muscle as needed. For allergic reaction.      BP 129/46  Pulse 124  Temp(Src) 99.8 F (37.7 C) (Oral)  Resp 20  SpO2 100%  Physical Exam  Nursing note and vitals reviewed. Constitutional: She is oriented to person, place, and time. She appears well-developed and well-nourished. No distress.  HENT:  Head: Normocephalic and atraumatic.       Mucous membranes are dry  Eyes: EOM are normal.  Neck: Normal range of motion.  Cardiovascular: Regular rhythm and normal heart sounds.        Tachycardic  Pulmonary/Chest: Effort normal and breath sounds normal.  Abdominal: Soft. She exhibits no distension. There is tenderness.       Left-sided abdominal tenderness with guarding.  No frank peritonitis on exam  Musculoskeletal: Normal range of motion.  Neurological: She is alert and oriented to person, place, and time.  Skin: Skin is warm and dry.  Psychiatric: She has a normal mood and affect. Judgment normal.    ED Course  Procedures    Date: 11/11/2011  Rate: 125  Rhythm: Atrial fibrillation with rapid ventricular response  QRS Axis: normal  Intervals: normal  ST/T Wave abnormalities: Nonspecific ST and T wave change  Conduction Disutrbances: none  Narrative Interpretation:   Old EKG Reviewed: Change from prior EKG      CRITICAL CARE Performed by: Lyanne Co Total critical care time: 35 Critical care time was exclusive of separately billable procedures and treating other patients. Critical care was necessary to treat or prevent imminent or life-threatening deterioration. Critical care was time spent personally by me on the following activities: development of treatment plan with patient and/or surrogate as well as nursing, discussions with  consultants, evaluation of patient's response to treatment, examination of patient, obtaining history from patient or surrogate, ordering and performing treatments and interventions, ordering and review of laboratory studies, ordering and review of radiographic studies, pulse oximetry and re-evaluation of patient's condition.   Labs Reviewed  CBC - Abnormal; Notable for the following:    WBC 13.5 (*)    Hemoglobin 11.9 (*)    All other components within normal limits  COMPREHENSIVE METABOLIC PANEL - Abnormal; Notable for the following:    Sodium 131 (*)    Albumin 2.9 (*)    GFR calc non Af Amer 55 (*)    GFR calc Af Amer 64 (*)    All other components within normal limits  PROTIME-INR - Abnormal; Notable for the following:    Prothrombin Time 34.0 (*)    INR 3.29 (*)    All other components within normal limits  BLOOD GAS, VENOUS - Abnormal; Notable for the following:    pH, Ven 7.346 (*)    Bicarbonate 25.9 (*)    All other components within normal limits  LACTIC ACID, PLASMA  TROPONIN I  PROCALCITONIN  URINALYSIS, WITH MICROSCOPIC  CULTURE, BLOOD (ROUTINE X 2)  CULTURE, BLOOD (ROUTINE X 2)  BLOOD GAS, VENOUS  URINE CULTURE   Ct Abdomen Pelvis W Contrast  11/11/2011  *RADIOLOGY REPORT*  Clinical Data: Fever and abdominal pain.  CT ABDOMEN AND PELVIS WITH CONTRAST  Technique:  Multidetector CT imaging of the abdomen and pelvis was performed following the standard protocol during bolus administration of intravenous contrast.  Contrast: OMNIPAQUE IOHEXOL 300 MG/ML  SOLN  Comparison: Abdominal CT 07/28/2011  Findings: Lung bases again demonstrate scarring along the right major fissure at the right lung base.  The patient has a large amount of free intraperitoneal air.  There is mesenteric edema and inflammatory changes centered around the mid and left anterior pelvis.  There are multiple locules of extraluminal gas in this location which appear to be adjacent to the sigmoid colon.   The gas collection roughly measures 3.5 x 3.7 cm.  There is an adjacent air- fluid collection that roughly measures 3.6 x 2.0 cm on sequence 6, image 53.  There is gas within the adjacent urinary bladder but this is probably related to the Foley catheter.  There is slightly complex fluid in the pelvic cul-de-sac which could also represent an abscess formation.  Again noted are scattered low density areas throughout the liver which are probably associated with cysts and possibly hemangiomas. No gross abnormality to the portal venous system or gallbladder. Again noted are calcifications within the spleen.  There is increased perinephric edema bilaterally.  Again noted is a hyperdense exophytic structure along the upper pole of the left kidney that roughly measures 1.2 cm.  This structure  measured 1.0 cm in 2006 and probably represents a hyperdense cyst.  3.4 cm low density left renal cyst.  Again seen is a 8 mm low density structure in the posterior right kidney which could represent a small angiolipoma.  No gross abnormality to the adrenal glands or pancreas.  Severe degenerative changes in the lumbar spine with marked anterolisthesis at L5-S1.  These findings are similar to the prior examination.  IMPRESSION: The study is positive for free intraperitoneal air.  Large amount of inflammation in the pelvis with extraluminal air-fluid collections.  The inflammation appears to be associated with the sigmoid colon and related to acute sigmoid diverticulitis.  There are complex abscess collections in the anterior pelvis and suspect early abscess formation in the posterior pelvis.  Multiple low-density structures in the liver are suggestive for cysts and possibly hemangiomas.  Stable hyperdense left renal cyst.  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  These results were called by telephone on 11/11/2011  at  3:44 p.m. to  Dr. Rubin Payor., who verbally acknowledged these results.  Original Report Authenticated  By: Richarda Overlie, M.D.   Dg Abd Acute W/chest  11/11/2011  *RADIOLOGY REPORT*  Clinical Data: Left lower quadrant pain.  Nausea and vomiting.  ACUTE ABDOMEN SERIES (ABDOMEN 2 VIEW & CHEST 1 VIEW)  Comparison: Chest radiograph on 01/12/2011  Findings: Several nondilated small bowel loops are seen which contain air fluid levels in the pelvis.  No dilated bowel loops identified.  No evidence of free air.  Low lung volumes are again seen, however both lungs are clear. Heart size is within normal limits.  Dual lead transvenous pacemaker remains in appropriate position.  Surgical clips again noted in the left chest wall.  IMPRESSION:  1.  Nonspecific, nonobstructive bowel gas pattern. 2.  No active cardiopulmonary disease.  Original Report Authenticated By: Danae Orleans, M.D.     1. Perforated diverticulitis   2. Intestinal diverticular abscess   3. Pneumoperitoneum   4. Atrial fibrillation with RVR     MDM  The patient feels and looks much better after IV fluids and control of her fever.  Family reports her altered mental status is improved as well.  Given her persistent left-sided abdominal tenderness a CT scan will be obtained to evaluate further.  The patient is requesting more pain medications at this time.  Her lactate is normal.  Her INR is 3.3.  She has been in paroxysmal atrial fibrillation while in the emergency department occasionally with rapid ventricular response.  At this time on treating this with antipyretics and fluids he'll hold on Cardizem  4:12 PM The patient and her family been updated.  She is on Coumadin and her INR is 3.3.  I have a call to the general surgeons to further evaluate her perforated diverticulum and diverticular abscess with significant pneumoperitoneum.  Given that her heart rate continues to be at 125 demonstrating atrial fibrillation with rapid ventricular response the patient will be started on low-dose Cardizem drip.  IV fluids will be continued. Vitamin K now as  the pt will likely require percutaneous drain versus open laparotomy        Lyanne Co, MD 11/11/11 1623  Lyanne Co, MD 11/11/11 [phone]

## 2011-11-11 NOTE — Telephone Encounter (Signed)
Left message for patient to call back  

## 2011-11-11 NOTE — ED Notes (Signed)
BMW:[id number]<GM> Expected date:11/11/11<BR> Expected time: 9:24 AM<BR> Means of arrival:Ambulance<BR> Comments:<BR> fever

## 2011-11-11 NOTE — ED Notes (Signed)
Called CCU, unable to take pt at this time

## 2011-11-11 NOTE — ED Notes (Signed)
zofran pulled and returned to ems

## 2011-11-11 NOTE — Consult Note (Signed)
CARDIOLOGY CONSULT NOTE  Patient ID: [name] MRN: [id number] DOB/AGE: September 06, 1933 78 y.o.  Admit date: 11/11/2011 Referring Physician: Dr Patria Mane Primary Physician Primary Cardiologist: Dr Ladona Ridgel Reason for Consultation: Atrial fibrillation  HPI: This is a 78 year old woman with paroxysmal atrial fibrillation and history of minimal coronary artery disease by cardiac catheterization in 2003. Most recent echocardiogram in 2011 showed severe asymmetric hypertrophy of the left ventricle with an ejection fraction of 30-35%. There was severe hypokinesis of the inferior, septal, distal anterior, distal septal, and apical walls. She was brought to the emergency department today because of progressive abdominal pain and generalized weakness. She has also had fever. Her husband tells me that she has had shortness of breath with exertion over the past 6 weeks, but she is also limited by chronic pain. She is chronically anticoagulated because of her atrial fibrillation. She was last seen in our office by Dr. Ladona Ridgel in September 2012.  The patient denies any chest pain or pressure. She denies dyspnea at present. The family tells me that her cognition is improved after receiving IV fluids. She denies orthopnea, PND, or palpitations.  In the emergency department she has been noted to have paroxysms of atrial fibrillation with RVR and we are asked to see her in consultation. Since her INR is therapeutic at 3.3, plans are to treat her with vitamin K and FFP and get her to the operating room tonight as she has been diagnosed with sigmoid diverticulitis with perforation.  Past Medical History  Diagnosis Date  . Insomnia, unspecified   . Osteoarthrosis, unspecified whether generalized or localized, unspecified site   . Unspecified hypothyroidism   . Unspecified essential hypertension   . Atrial fibrillation   . Sciatica   . Anemia   . COPD (chronic obstructive pulmonary disease)   . UTI (urinary tract  infection)   . GERD (gastroesophageal reflux disease)   . Pneumonia   . Asthma   . Clostridium difficile colitis   . Diverticulitis of sigmoid colon 11/2010    with abscess/contained perforation  . Dementia     mild  . Dehydration with hyponatremia 12/2010    hospitalized  . Angiodysplasia of stomach     ablated 2010  . Angiodysplasia of colon     ablated 2010  . Vitamin B12 deficiency (dietary) anemia 04/12/2011  . Shingles   . Sarcoma     right hand  . Malignant neoplasm of breast (female), unspecified site     left  . Dupuytren's disease     left hand     Past Surgical History  Procedure Date  . Nasal sinus surgery     x 3  . Appendectomy   . Hernia repair   . Abdominal hysterectomy   . Pacemaker insertion   . Eye surgery     x6-bilateral  . Finger amputation     right  . Foot surgery     right toes x 3  . Tonsillectomy   . Colonoscopy 01/2009    w/ablation of 3 mm AVM, diverticulosis, external hemorrhoids  . Upper gastrointestinal endoscopy 01/2009    w/polypectomy, AVM, hyperplastic polyp  . Abdominal hysterectomy      Family History  Problem Relation Age of Onset  . Emphysema Paternal Uncle   . Heart disease Mother   . Colon cancer Mother   . Pancreatic cancer Father   . Tuberculosis Paternal Uncle   . Breast cancer Maternal Aunt   . Breast cancer Cousin   .  Prostate cancer Cousin   . Colon cancer Cousin   . Uterine cancer Cousin     Social History: History   Social History  . Marital Status: Married    Spouse Name: N/A    Number of Children: 3  . Years of Education: N/A   Occupational History  . Home Maker    Social History Main Topics  . Smoking status: Former Smoker -- 0.5 packs/day for 21 years    Quit date: 06/20/1977  . Smokeless tobacco: Never Used  . Alcohol Use: Yes     1 glass wine/night  . Drug Use: No  . Sexually Active: Not on file   Other Topics Concern  . Not on file   Social History Narrative  . No narrative on  file      (Not in a hospital admission)  ROS: General: Positive for weakness, fevers, and chills. Eyes: no blurry vision, diplopia, or amaurosis ENT: no sore throat or hearing loss Resp: no cough, wheezing, or hemoptysis. Positive for shortness of breath with exertion CV: See history of present illness GI: no abdominal pain, nausea, vomiting, diarrhea, or constipation GU: no dysuria, frequency, or hematuria Skin: no rash Neuro: no headache, numbness, tingling, or weakness of extremities Musculoskeletal: Positive for diffuse joint pains, back pain Heme: no bleeding, DVT, or easy bruising Endo: no polydipsia or polyuria    Physical Exam: Blood pressure 146/52, pulse 117, temperature 99.8 F (37.7 C), temperature source Oral, resp. rate 25, SpO2 96.00%.  Pt is alert and oriented, but at times somnolent. She is obese, appropriately answers questions. HEENT: normal Neck: JVP normal. Carotid upstrokes normal without bruits. No thyromegaly. Lungs: equal expansion, clear bilaterally CV: Tachycardic and irregular with no appreciable murmur Abd: soft, obese, positive bowel sounds, diffuse tenderness Back: no CVA tenderness Ext: no C/C/E        Femoral pulses 2+=         DP/PT pulses intact and = Skin: warm and dry without rash Neuro: CNII-XII intact             Strength intact = bilaterally  Labs:   Lab Results  Component Value Date   WBC 13.5* 11/11/2011   HGB 11.9* 11/11/2011   HCT 36.8 11/11/2011   MCV 92.0 11/11/2011   PLT 262 11/11/2011    Lab 11/11/11 1035  NA 131*  K 4.0  CL 97  CO2 23  BUN 23  CREATININE 0.98  CALCIUM 9.2  PROT 6.6  BILITOT 0.4  ALKPHOS 64  ALT 9  AST 15  GLUCOSE 86   Lab Results  Component Value Date   CKTOTAL 118 01/10/2011   CKMB 5.2* 01/08/2011   TROPONINI <0.30 11/11/2011    No results found for this basename: CHOL   No results found for this basename: HDL   No results found for this basename: LDLCALC   No results found for this  basename: TRIG   No results found for this basename: CHOLHDL   No results found for this basename: LDLDIRECT      Radiology: Ct Abdomen Pelvis W Contrast  11/11/2011  *RADIOLOGY REPORT*  Clinical Data: Fever and abdominal pain.  CT ABDOMEN AND PELVIS WITH CONTRAST  Technique:  Multidetector CT imaging of the abdomen and pelvis was performed following the standard protocol during bolus administration of intravenous contrast.  Contrast: OMNIPAQUE IOHEXOL 300 MG/ML  SOLN  Comparison: Abdominal CT 07/28/2011  Findings: Lung bases again demonstrate scarring along the right major fissure at  the right lung base.  The patient has a large amount of free intraperitoneal air.  There is mesenteric edema and inflammatory changes centered around the mid and left anterior pelvis.  There are multiple locules of extraluminal gas in this location which appear to be adjacent to the sigmoid colon.  The gas collection roughly measures 3.5 x 3.7 cm.  There is an adjacent air- fluid collection that roughly measures 3.6 x 2.0 cm on sequence 6, image 53.  There is gas within the adjacent urinary bladder but this is probably related to the Foley catheter.  There is slightly complex fluid in the pelvic cul-de-sac which could also represent an abscess formation.  Again noted are scattered low density areas throughout the liver which are probably associated with cysts and possibly hemangiomas. No gross abnormality to the portal venous system or gallbladder. Again noted are calcifications within the spleen.  There is increased perinephric edema bilaterally.  Again noted is a hyperdense exophytic structure along the upper pole of the left kidney that roughly measures 1.2 cm.  This structure  measured 1.0 cm in 2006 and probably represents a hyperdense cyst.  3.4 cm low density left renal cyst.  Again seen is a 8 mm low density structure in the posterior right kidney which could represent a small angiolipoma.  No gross abnormality  to the adrenal glands or pancreas.  Severe degenerative changes in the lumbar spine with marked anterolisthesis at L5-S1.  These findings are similar to the prior examination.  IMPRESSION: The study is positive for free intraperitoneal air.  Large amount of inflammation in the pelvis with extraluminal air-fluid collections.  The inflammation appears to be associated with the sigmoid colon and related to acute sigmoid diverticulitis.  There are complex abscess collections in the anterior pelvis and suspect early abscess formation in the posterior pelvis.  Multiple low-density structures in the liver are suggestive for cysts and possibly hemangiomas.  Stable hyperdense left renal cyst.  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  These results were called by telephone on 11/11/2011  at  3:44 p.m. to  Dr. Rubin Payor., who verbally acknowledged these results.  Original Report Authenticated By: Richarda Overlie, M.D.   Dg Abd Acute W/chest  11/11/2011  *RADIOLOGY REPORT*  Clinical Data: Left lower quadrant pain.  Nausea and vomiting.  ACUTE ABDOMEN SERIES (ABDOMEN 2 VIEW & CHEST 1 VIEW)  Comparison: Chest radiograph on 01/12/2011  Findings: Several nondilated small bowel loops are seen which contain air fluid levels in the pelvis.  No dilated bowel loops identified.  No evidence of free air.  Low lung volumes are again seen, however both lungs are clear. Heart size is within normal limits.  Dual lead transvenous pacemaker remains in appropriate position.  Surgical clips again noted in the left chest wall.  IMPRESSION:  1.  Nonspecific, nonobstructive bowel gas pattern. 2.  No active cardiopulmonary disease.  Original Report Authenticated By: Danae Orleans, M.D.    EKG: Atrial fibrillation with left bundle branch block, demand ventricular pacing  ASSESSMENT AND PLAN:  1. Sigmoid diverticulitis with perforation 2. Atrial fibrillation with RVR 3. Cardiomyopathy, suspect ischemic with segmental wall motion  abnormalities from echo in 2011 4. Status post permanent pacemaker 5. Supratherapeutic INR  The patient is at increased risk of noncardiac surgery considering her cardiomyopathy and atrial fibrillation. However, her risk is not prohibitive and her surgical needs urgent. I have recommended that she start on IV amiodarone as I think her biggest problem from  a cardiac perspective around the time of surgery will be related to rhythm control with her atrial fibrillation. IV diltiazem is not an ideal drug for her considering her cardiomyopathy and risk of postoperative congestive heart failure. Will follow along postoperatively to help manage her cardiac problems. She does not appear to have decompensated congestive heart failure at the present time, but as she has required fluid resuscitation we will have to follow her volume status closely.  [name] 11/11/2011, 6:28 PM

## 2011-11-11 NOTE — ED Notes (Signed)
Pt instructed to begin drinking oral contrast.

## 2011-11-11 NOTE — ED Notes (Signed)
Pt received Zofran per EMS

## 2011-11-11 NOTE — Progress Notes (Signed)
eLink Physician-Brief Progress Note Patient Name: Elain Wixon Cullen DOB: 01/22/1934 MRN: [id number]  Date of Service  11/11/2011   HPI/Events of Note     eICU Interventions  Tylenol for fever, fent for pain chk INR after 2 U FFp, trasnfuse 4 u FFP total, amio gtt per cards for AF   Intervention Category Intermediate Interventions: Coagulopathy - evaluation and management Minor Interventions: Routine modifications to care plan (e.g. PRN medications for pain, fever)  Keaton Stirewalt V. 11/11/2011, 9:29 PM

## 2011-11-11 NOTE — H&P (Signed)
Name: [name] MRN: [id number] DOB: [birth year]    LOS: 0  Referring Provider:  EDP Reason for Referral:  shock  PULMONARY / CRITICAL CARE MEDICINE  HPI:  41 yowf with apparent steroid dep copd/ab s/p remote smoking cessation with baseline doe x across a room with h/o chf/ caf adn diverticulitis to [facility] am 5/24 with 24 h increased abd pain and low bp responding to fluids but ct abd c/w free air and divertular abscess > to OR pm 5/24 planned and ccm asked to admit.   At present lying almost flat s sob or cough c/o abd and back pain and nausea, mentating ok.  Did have N and V and D earlier, resolved. No cp.  Sleeping ok without nocturnal  or early am exacerbation  of respiratory  c/o's or need for noct saba. Also denies any obvious fluctuation of symptoms with weather or environmental changes or other aggravating or alleviating factors except as outlined above   ROS  At present neg for  any significant sore throat, dysphagia, dental problems, itching, sneezing,  nasal congestion or excess/ purulent secretions, ear ache,   , unintended wt loss, pleuritic or exertional cp, hemoptysis, palpitations, orthopnea pnd or leg swelling.  Also denies presyncope, palpitations, heartburn, anorexia, diarrhea  or change in bowel or urinary habits, change in stools or urine, dysuria,hematuria,  rash, arthralgias, visual complaints, headache, numbness weakness or ataxia or problems with walking or coordination. No noted change in mood/affect or memory.       Past Medical History  Diagnosis Date  . Insomnia, unspecified   . Osteoarthrosis, unspecified whether generalized or localized, unspecified site   . Unspecified hypothyroidism   . Unspecified essential hypertension   . Atrial fibrillation   . Sciatica   . Anemia   . COPD (chronic obstructive pulmonary disease)   . UTI (urinary tract infection)   . GERD (gastroesophageal reflux disease)   . Pneumonia   . Asthma   . Clostridium difficile colitis    . Diverticulitis of sigmoid colon 11/2010    with abscess/contained perforation  . Dementia     mild  . Dehydration with hyponatremia 12/2010    hospitalized  . Angiodysplasia of stomach     ablated 2010  . Angiodysplasia of colon     ablated 2010  . Vitamin B12 deficiency (dietary) anemia 04/12/2011  . Shingles   . Sarcoma     right hand  . Malignant neoplasm of breast (female), unspecified site     left  . Dupuytren's disease     left hand   Past Surgical History  Procedure Date  . Nasal sinus surgery     x 3  . Appendectomy   . Hernia repair   . Abdominal hysterectomy   . Pacemaker insertion   . Eye surgery     x6-bilateral  . Finger amputation     right  . Foot surgery     right toes x 3  . Tonsillectomy   . Colonoscopy 01/2009    w/ablation of 3 mm AVM, diverticulosis, external hemorrhoids  . Upper gastrointestinal endoscopy 01/2009    w/polypectomy, AVM, hyperplastic polyp  . Abdominal hysterectomy    Prior to Admission medications   Medication Sig Start Date End Date Taking? Authorizing Provider  albuterol (PROVENTIL HFA) 108 (90 BASE) MCG/ACT inhaler Inhale 2 puffs into the lungs every 6 (six) hours as needed. For shortness of breath.   Yes Historical Provider, MD  azelastine (ASTELIN) 137 MCG/SPRAY  nasal spray Place 2 sprays into the nose at bedtime. Use in each nostril as directed 11/04/11 11/03/12 Yes Tammy S Parrett, NP  beclomethasone (QVAR) 80 MCG/ACT inhaler Inhale 2 puffs into the lungs 2 (two) times daily.   Yes Historical Provider, MD  benzonatate (TESSALON) 200 MG capsule Take 1 capsule (200 mg total) by mouth 3 (three) times daily as needed for cough. 11/04/11 11/11/11 Yes Tammy S Parrett, NP  budesonide (RHINOCORT AQUA) 32 MCG/ACT nasal spray Place 1 spray into the nose daily.    Yes Historical Provider, MD  candesartan (ATACAND) 16 MG tablet Take 16 mg by mouth daily.     Yes Historical Provider, MD  Cholecalciferol (VITAMIN D3) 50000 UNITS CAPS Take 1  tablet by mouth every 7 (seven) days. Taken on Sundays.   Yes Historical Provider, MD  cyclobenzaprine (FLEXERIL) 10 MG tablet Take 10 mg by mouth 2 (two) times daily as needed. For pain.   Yes Historical Provider, MD  docusate sodium (COLACE) 100 MG capsule Take 100 mg by mouth 2 (two) times daily as needed. For stool softener.   Yes Historical Provider, MD  donepezil (ARICEPT) 10 MG tablet Take 1 tablet (10 mg total) by mouth at bedtime. 04/18/11  Yes Iva Boop, MD  DULoxetine (CYMBALTA) 30 MG capsule Take 30 mg by mouth 2 (two) times daily.    Yes Historical Provider, MD  Eszopiclone (ESZOPICLONE) 3 MG TABS Take 3 mg by mouth at bedtime. Lunesta    Yes Historical Provider, MD  furosemide (LASIX) 20 MG tablet Take 20 mg by mouth daily as needed. For fluid retention.   Yes Historical Provider, MD  HYDROcodone-acetaminophen (NORCO) 10-325 MG per tablet Take 1 tablet by mouth every 6 (six) hours as needed. For pain.   Yes Historical Provider, MD  lansoprazole (PREVACID) 30 MG capsule Take 1 capsule (30 mg total) by mouth daily. 10/31/11  Yes Iva Boop, MD  levothyroxine (SYNTHROID) 75 MCG tablet Take 75 mcg by mouth See admin instructions. Take 1 every day except Monday and Friday   Yes Historical Provider, MD  levothyroxine (SYNTHROID, LEVOTHROID) 50 MCG tablet Take 50 mcg by mouth See admin instructions. Take 1 tab on Monday and Friday   Yes Historical Provider, MD  Loperamide HCl (IMODIUM A-D) 2 MG CHEW 1/2-1 tablet as needed to prevent diarrhea 04/18/11  Yes Iva Boop, MD  meloxicam (MOBIC) 15 MG tablet Take 15 mg by mouth daily.     Yes Historical Provider, MD  metoprolol succinate (TOPROL-XL) 50 MG 24 hr tablet TAKE 1 TABLET BY MOUTH EVERY DAY 08/08/11  Yes Beatrice Lecher, PA  nystatin (MYCOSTATIN) 100000 UNIT/ML suspension Take 500,000 Units by mouth 3 (three) times daily as needed. For thrush.   Yes Historical Provider, MD  omalizumab Geoffry Paradise) 150 MG injection Inject into the skin  every 28 (twenty-eight) days. Both arms   Yes Historical Provider, MD  ondansetron (ZOFRAN ODT) 4 MG disintegrating tablet Take 1 tab on the tongue every 6 hours as needed for nausea. 07/26/11  Yes Amy S Esterwood, PA  predniSONE (DELTASONE) 5 MG tablet Take 7.5 mg by mouth daily.    Yes Historical Provider, MD  ranitidine (ZANTAC) 150 MG tablet Take 300 mg by mouth daily.   Yes Historical Provider, MD  simvastatin (ZOCOR) 10 MG tablet Take 10 mg by mouth at bedtime.     Yes Historical Provider, MD  traZODone (DESYREL) 100 MG tablet Take 100 mg by mouth at bedtime. For  sleep.   Yes Historical Provider, MD  warfarin (COUMADIN) 3 MG tablet Take 3 mg by mouth See admin instructions. 1 tab daily except for 1.5 tab daily on Mondays and Fridays. 06/01/11  Yes Marinus Maw, MD  zafirlukast (ACCOLATE) 20 MG tablet Take 20 mg by mouth 2 (two) times daily.    Yes Historical Provider, MD  EPINEPHrine (EPIPEN) 0.3 mg/0.3 mL DEVI Inject 0.3 mg into the muscle as needed. For allergic reaction.    Historical Provider, MD   Allergies Allergies  Allergen Reactions  . Aspirin   . Ibuprofen   . Iron Dextran     REACTION: itching after test dose  . Metoclopramide Hcl     REACTION: chomping mouth  . Sulfonamide Derivatives     Family History Family History  Problem Relation Age of Onset  . Emphysema Paternal Uncle   . Heart disease Mother   . Colon cancer Mother   . Pancreatic cancer Father   . Tuberculosis Paternal Uncle   . Breast cancer Maternal Aunt   . Breast cancer Cousin   . Prostate cancer Cousin   . Colon cancer Cousin   . Uterine cancer Cousin    Social History  reports that she quit smoking about 34 years ago. She has never used smokeless tobacco. She reports that she drinks alcohol. She reports that she does not use illicit drugs.    Events Since Admission: see above  Current Status: awaiting transfer to ICU, awaiting FFP and Vit K rx  Vital Signs: Temp:  [99.8 F (37.7 C)-103  F (39.4 C)] 99.8 F (37.7 C) (05/24 1525) Pulse Rate:  [29-136] 115  (05/24 1830) Resp:  [20-31] 27  (05/24 1830) BP: (102-150)/(38-78) 141/66 mmHg (05/24 1830) SpO2:  [91 %-100 %] 97 % (05/24 1830) FIO2  2 lpm   Physical Examination: HEENT mild turbinate edema.  Oropharynx no thrush or excess pnd or cobblestoning.  No JVD or cervical adenopathy. Mild accessory muscle hypertrophy. Trachea midline, nl thryroid. Chest was hyperinflated by percussion with diminished breath sounds and mild increased exp time without wheeze. Hoover sign positive at end inspiration. Regular rate and rhythm without murmur gallop or rub or increase P2 or edema.  Abd: diffusely tender with rebound, limited insp excursion. Ext warm without cyanosis or clubbing.    Active Problems:  Acute abdomen  HYPOTHYROIDISM  HYPERTENSION  Atrial fibrillation  HEART FAILURE  COPD (chronic obstructive pulmonary disease)   ASSESSMENT AND PLAN  PULMONARY  Lab 11/11/11 1055  PHART --  PCO2ART --  PO2ART --  HCO3 25.9*  O2SAT 56.3    Ventilator Settings:     PFTs 2009 c/w GOLD I copd CXR:  Ok 5/24 preop ETT:   A:  Mild copd / ? Steroid dependent? P:   Stress steroids, watch for autopeep on vent post op  CARDIOVASCULAR  Lab 11/11/11 1035  TROPONINI <0.30  LATICACIDVEN 1.4  PROBNP --  Cardiology consult 5/24 ([place]) ECG:  Rapid AF Lines:   A: Afib P: Per cards/ cardizem drip      Amiodarone started 5/24  RENAL  Lab 11/11/11 1035  NA 131*  K 4.0  CL 97  CO2 23  BUN 23  CREATININE 0.98  CALCIUM 9.2  MG --  PHOS --   Intake/Output      05/24 0701 - 05/25 0700   I.V. 1500   Total Intake 1500   Net +1500        Foley:  In ER  5/24  A:  No issues P:  Monitor uop  GASTROINTESTINAL  Lab 11/11/11 1035  AST 15  ALT 9  ALKPHOS 64  BILITOT 0.4  PROT 6.6  ALBUMIN 2.9*   Diet : NP0   A:  Probable diverticular abscess/rupture P:  For or 5/24 pm  HEMATOLOGIC  Lab 11/11/11 1035    HGB 11.9*  HCT 36.8  PLT 262  INR 3.29*  APTT --   A:  Coagulopathic from coumadin Rx vit K/ FFP pm 5/24    INFECTIOUS  Lab 11/11/11 1035  WBC 13.5*  PROCALCITON 0.94   Cultures: UC 5/24 >> BC x2 5/24 >>  Antibiotics: Vancomycin  5/24 only Zosyn 5/24 >>  A:  Diverticular abscess/ low grade sepsis  P:  Zosyn per dashboard  ENDOCRINE No results found for this basename: GLUCAP:5 in the last 168 hours A:  No issues    NEUROLOGIC  A:  Dementia dx pre op, Pos risk delirium P:  Minimize sedation/ hypnotics  BEST PRACTICE / DISPOSITION - Level of Care: ICU - Primary Service:  PCCM - Consultants:  CCS/ gen surgery - Code Status:  FUll - Diet:  See GI - DVT Px: PAS - GI Px:  PPI - Skin Integrity:  ok - Social / Family:  At bedside  The patient is critically ill with multiple organ systems failure and requires high complexity decision making for assessment and support, frequent evaluation and titration of therapies, application of advanced monitoring technologies and extensive interpretation of multiple databases. Critical Care Time devoted to patient care services described in this note is 60 minutes.   Sandrea Hughs, M.D. Pulmonary and Critical Care Medicine [facility] Pager: [phone]  11/11/2011, 7:06 PM

## 2011-11-11 NOTE — ED Notes (Signed)
H/O A Fib and pacemaker.

## 2011-11-11 NOTE — ED Notes (Signed)
Pt in assisted living, found to have fever, abdominal pain  and altered LOC x 2 days. Also c/o back pain, h/o same per pt.

## 2011-11-11 NOTE — Progress Notes (Signed)
Pt seen in ICU.  She has a history concerning for perforated diverticulitis and Ct with free air.  Her abdomen is concerning for acute abdomen and I have recommended ex. Lap/ bowel resection/ostomy.  Her INR is still 1.99 and another unit of FFP is infusing. She should be ready for surgery soon.  I discussed the procedure and risks of infection, bleeding, pain, scarring, ureter injury, need for ostomy, wound infection, and possible death and she expressed understanding.  Husband and daughter were present for the discussion as well.

## 2011-11-12 ENCOUNTER — Inpatient Hospital Stay (HOSPITAL_COMMUNITY): Payer: Medicare Other | Admitting: Anesthesiology

## 2011-11-12 ENCOUNTER — Inpatient Hospital Stay (HOSPITAL_COMMUNITY): Payer: Medicare Other

## 2011-11-12 ENCOUNTER — Encounter (HOSPITAL_COMMUNITY)
Admission: EM | Disposition: A | Discharge status: Nursing Home (Includes ICF) | Payer: Self-pay | Source: Home / Self Care | Attending: Internal Medicine

## 2011-11-12 ENCOUNTER — Encounter (HOSPITAL_COMMUNITY): Payer: Self-pay | Admitting: Anesthesiology

## 2011-11-12 DIAGNOSIS — J95821 Acute postprocedural respiratory failure: Secondary | ICD-10-CM | POA: Diagnosis present

## 2011-11-12 DIAGNOSIS — K5780 Diverticulitis of intestine, part unspecified, with perforation and abscess without bleeding: Secondary | ICD-10-CM | POA: Diagnosis present

## 2011-11-12 DIAGNOSIS — I959 Hypotension, unspecified: Secondary | ICD-10-CM | POA: Diagnosis present

## 2011-11-12 DIAGNOSIS — J449 Chronic obstructive pulmonary disease, unspecified: Secondary | ICD-10-CM | POA: Diagnosis present

## 2011-11-12 DIAGNOSIS — Z8679 Personal history of other diseases of the circulatory system: Secondary | ICD-10-CM

## 2011-11-12 DIAGNOSIS — K659 Peritonitis, unspecified: Secondary | ICD-10-CM | POA: Diagnosis present

## 2011-11-12 HISTORY — PX: LAPAROTOMY: SHX154

## 2011-11-12 HISTORY — PX: COLOSTOMY: SHX63

## 2011-11-12 HISTORY — PX: COLOSTOMY REVISION: SHX5232

## 2011-11-12 LAB — PREPARE FRESH FROZEN PLASMA: Unit division: 0

## 2011-11-12 LAB — PROTIME-INR
INR: 1.72 — ABNORMAL HIGH (ref 0.00–1.49)
Prothrombin Time: 20.5 seconds — ABNORMAL HIGH (ref 11.6–15.2)

## 2011-11-12 SURGERY — LAPAROTOMY, EXPLORATORY
Anesthesia: General | Site: Abdomen | Wound class: Dirty or Infected

## 2011-11-12 MED ORDER — CHLORHEXIDINE GLUCONATE CLOTH 2 % EX PADS
6.0000 | MEDICATED_PAD | Freq: Every day | CUTANEOUS | Status: AC
  Administered 2011-11-12 – 2011-11-16 (×5): 6 via TOPICAL

## 2011-11-12 MED ORDER — FENTANYL CITRATE 0.05 MG/ML IJ SOLN
25.0000 ug | INTRAMUSCULAR | Status: DC | PRN
  Administered 2011-11-12: 50 ug via INTRAVENOUS
  Administered 2011-11-12: 100 ug via INTRAVENOUS
  Filled 2011-11-12: qty 2

## 2011-11-12 MED ORDER — CHLORHEXIDINE GLUCONATE CLOTH 2 % EX PADS: 6.0000 | MEDICATED_PAD | Freq: Every day | CUTANEOUS | Status: DC

## 2011-11-12 MED ORDER — MIDAZOLAM HCL 5 MG/ML IJ SOLN
INTRAMUSCULAR | Status: AC
  Administered 2011-11-12: 2 mg
  Filled 2011-11-12: qty 1

## 2011-11-12 MED ORDER — NALOXONE HCL 0.4 MG/ML IJ SOLN: 0.4000 mg | INTRAMUSCULAR | Status: DC | PRN

## 2011-11-12 MED ORDER — DIPHENHYDRAMINE HCL 12.5 MG/5ML PO ELIX: 12.5000 mg | ORAL_SOLUTION | Freq: Four times a day (QID) | ORAL | Status: DC | PRN

## 2011-11-12 MED ORDER — 0.9 % SODIUM CHLORIDE (POUR BTL) OPTIME
TOPICAL | Status: DC | PRN
  Administered 2011-11-12: 3000 mL

## 2011-11-12 MED ORDER — PANTOPRAZOLE SODIUM 40 MG IV SOLR
40.0000 mg | INTRAVENOUS | Status: DC
  Administered 2011-11-12 – 2011-11-27 (×16): 40 mg via INTRAVENOUS
  Filled 2011-11-12 (×17): qty 40

## 2011-11-12 MED ORDER — CISATRACURIUM BESYLATE (PF) 10 MG/5ML IV SOLN
INTRAVENOUS | Status: DC | PRN
  Administered 2011-11-12: 6 mg via INTRAVENOUS
  Administered 2011-11-12: 4 mg via INTRAVENOUS
  Administered 2011-11-12: 6 mg via INTRAVENOUS
  Administered 2011-11-12 (×2): 4 mg via INTRAVENOUS

## 2011-11-12 MED ORDER — LACTATED RINGERS IV SOLN
INTRAVENOUS | Status: DC | PRN
  Administered 2011-11-12: 05:00:00 via INTRAVENOUS

## 2011-11-12 MED ORDER — MIDAZOLAM HCL 5 MG/ML IJ SOLN
1.0000 mg | INTRAMUSCULAR | Status: DC | PRN
  Administered 2011-11-12: 1 mg via INTRAVENOUS
  Filled 2011-11-12: qty 1

## 2011-11-12 MED ORDER — ONDANSETRON HCL 4 MG/2ML IJ SOLN
4.0000 mg | Freq: Four times a day (QID) | INTRAMUSCULAR | Status: DC | PRN
  Administered 2011-11-12: 4 mg via INTRAVENOUS
  Filled 2011-11-12: qty 2

## 2011-11-12 MED ORDER — MUPIROCIN 2 % EX OINT: 1.0000 "application " | TOPICAL_OINTMENT | Freq: Two times a day (BID) | CUTANEOUS | Status: DC

## 2011-11-12 MED ORDER — HYDROCORTISONE SOD SUCCINATE 100 MG IJ SOLR
50.0000 mg | Freq: Two times a day (BID) | INTRAMUSCULAR | Status: DC
  Administered 2011-11-12 – 2011-11-13 (×3): 50 mg via INTRAVENOUS
  Filled 2011-11-12 (×2): qty 1

## 2011-11-12 MED ORDER — AMIODARONE HCL IN DEXTROSE 360-4.14 MG/200ML-% IV SOLN
INTRAVENOUS | Status: DC | PRN
  Administered 2011-11-12: 30 mg/h via INTRAVENOUS

## 2011-11-12 MED ORDER — BUDESONIDE 0.25 MG/2ML IN SUSP
0.2500 mg | Freq: Four times a day (QID) | RESPIRATORY_TRACT | Status: DC
  Administered 2011-11-12 – 2011-11-17 (×21): 0.25 mg via RESPIRATORY_TRACT
  Filled 2011-11-12 (×30): qty 2

## 2011-11-12 MED ORDER — SODIUM CHLORIDE 0.9 % IV SOLN
INTRAVENOUS | Status: DC
  Administered 2011-11-12 – 2011-11-14 (×3): 20 mL/h via INTRAVENOUS

## 2011-11-12 MED ORDER — ETOMIDATE 2 MG/ML IV SOLN
INTRAVENOUS | Status: DC | PRN
  Administered 2011-11-12: 20 mg via INTRAVENOUS

## 2011-11-12 MED ORDER — SODIUM CHLORIDE 0.9 % IJ SOLN
9.0000 mL | INTRAMUSCULAR | Status: DC | PRN
  Administered 2011-11-14 – 2011-11-16 (×2): 9 mL via INTRAVENOUS

## 2011-11-12 MED ORDER — MUPIROCIN 2 % EX OINT
1.0000 | TOPICAL_OINTMENT | Freq: Two times a day (BID) | CUTANEOUS | Status: AC
Start: 2011-11-12 — End: 2011-11-16
  Administered 2011-11-12 – 2011-11-16 (×10): 1 via NASAL
  Filled 2011-11-12 (×2): qty 22

## 2011-11-12 MED ORDER — SUCCINYLCHOLINE CHLORIDE 20 MG/ML IJ SOLN
INTRAMUSCULAR | Status: DC | PRN
  Administered 2011-11-12: 100 mg via INTRAVENOUS

## 2011-11-12 MED ORDER — BIOTENE DRY MOUTH MT LIQD
15.0000 mL | Freq: Four times a day (QID) | OROMUCOSAL | Status: DC
  Administered 2011-11-12 – 2011-12-02 (×67): 15 mL via OROMUCOSAL

## 2011-11-12 MED ORDER — DIPHENHYDRAMINE HCL 50 MG/ML IJ SOLN: 12.5000 mg | Freq: Four times a day (QID) | INTRAMUSCULAR | Status: DC | PRN

## 2011-11-12 MED ORDER — SODIUM CHLORIDE 0.9 % IV SOLN: INTRAVENOUS | Status: DC

## 2011-11-12 MED ORDER — LACTATED RINGERS IV SOLN
INTRAVENOUS | Status: DC | PRN
  Administered 2011-11-12: 04:00:00 via INTRAVENOUS

## 2011-11-12 MED ORDER — HYDROMORPHONE 0.3 MG/ML IV SOLN
INTRAVENOUS | Status: DC
  Administered 2011-11-12: 1.59 mg via INTRAVENOUS
  Administered 2011-11-12: 0.3 mg via INTRAVENOUS
  Administered 2011-11-13: 14:00:00 via INTRAVENOUS
  Administered 2011-11-13: 1.79 mg via INTRAVENOUS
  Administered 2011-11-13: 1.39 mg via INTRAVENOUS
  Administered 2011-11-13: 1.19 mg via INTRAVENOUS
  Administered 2011-11-13: 0.4 mg via INTRAVENOUS
  Administered 2011-11-14: 0.8 mg via INTRAVENOUS
  Administered 2011-11-14: 0.4 mg via INTRAVENOUS
  Administered 2011-11-14: 1.19 mg via INTRAVENOUS
  Administered 2011-11-14 (×2): 0.4 mg via INTRAVENOUS
  Administered 2011-11-14: 0.3 mg via INTRAVENOUS
  Administered 2011-11-15: 1.59 mg via INTRAVENOUS
  Administered 2011-11-15: 0.6 mg via INTRAVENOUS
  Administered 2011-11-15: 1.19 mg via INTRAVENOUS
  Administered 2011-11-15: 0.2 mg via INTRAVENOUS
  Administered 2011-11-15: 0.4 mg via INTRAVENOUS
  Administered 2011-11-16: 1.89 mg via INTRAVENOUS
  Administered 2011-11-16: 0.199 mg via INTRAVENOUS
  Administered 2011-11-16: 1.39 mg via INTRAVENOUS
  Administered 2011-11-16: 0.539 mg via INTRAVENOUS
  Administered 2011-11-16: 0.799 mg via INTRAVENOUS
  Administered 2011-11-16: 0.6 mg via INTRAVENOUS
  Administered 2011-11-17: 3.16 mg via INTRAVENOUS
  Administered 2011-11-17: 0.999 mg via INTRAVENOUS
  Administered 2011-11-17: 10:00:00 via INTRAVENOUS
  Administered 2011-11-17: 1.19 mg via INTRAVENOUS
  Administered 2011-11-17: 0.999 mg via INTRAVENOUS
  Administered 2011-11-17: 0.6 mg via INTRAVENOUS
  Administered 2011-11-18: 1.59 mg via INTRAVENOUS
  Administered 2011-11-18: 0.999 mg via INTRAVENOUS
  Administered 2011-11-18: 2.99 mg via INTRAVENOUS
  Administered 2011-11-18: 3.39 mg via INTRAVENOUS
  Administered 2011-11-18: 1.39 mg via INTRAVENOUS
  Administered 2011-11-18: 1.19 mg via INTRAVENOUS
  Administered 2011-11-18: 21:00:00 via INTRAVENOUS
  Administered 2011-11-19: 2.79 mg via INTRAVENOUS
  Filled 2011-11-12 (×7): qty 25

## 2011-11-12 MED ORDER — CHLORHEXIDINE GLUCONATE 0.12 % MT SOLN
15.0000 mL | Freq: Two times a day (BID) | OROMUCOSAL | Status: DC
  Administered 2011-11-12 – 2011-12-02 (×39): 15 mL via OROMUCOSAL
  Filled 2011-11-12 (×37): qty 15

## 2011-11-12 MED ORDER — ONDANSETRON HCL 4 MG/2ML IJ SOLN
INTRAMUSCULAR | Status: AC
  Administered 2011-11-12: 4 mg
  Filled 2011-11-12: qty 2

## 2011-11-12 MED ORDER — FENTANYL CITRATE 0.05 MG/ML IJ SOLN
INTRAMUSCULAR | Status: DC | PRN
  Administered 2011-11-12: 50 ug via INTRAVENOUS
  Administered 2011-11-12: 100 ug via INTRAVENOUS
  Administered 2011-11-12 (×2): 50 ug via INTRAVENOUS

## 2011-11-12 MED ORDER — DEXAMETHASONE SODIUM PHOSPHATE 10 MG/ML IJ SOLN
INTRAMUSCULAR | Status: DC | PRN
  Administered 2011-11-12: 10 mg via INTRAVENOUS

## 2011-11-12 MED ORDER — IPRATROPIUM BROMIDE 0.02 % IN SOLN
0.5000 mg | Freq: Four times a day (QID) | RESPIRATORY_TRACT | Status: DC
  Administered 2011-11-12 – 2011-11-19 (×28): 0.5 mg via RESPIRATORY_TRACT
  Filled 2011-11-12 (×27): qty 2.5

## 2011-11-12 MED ORDER — ALBUTEROL SULFATE (5 MG/ML) 0.5% IN NEBU
2.5000 mg | INHALATION_SOLUTION | RESPIRATORY_TRACT | Status: DC | PRN
  Administered 2011-11-12: 2.5 mg via RESPIRATORY_TRACT
  Filled 2011-11-12: qty 0.5

## 2011-11-12 MED ORDER — ALBUTEROL SULFATE (5 MG/ML) 0.5% IN NEBU
2.5000 mg | INHALATION_SOLUTION | Freq: Four times a day (QID) | RESPIRATORY_TRACT | Status: DC
  Administered 2011-11-12 – 2011-11-14 (×8): 2.5 mg via RESPIRATORY_TRACT
  Filled 2011-11-12 (×10): qty 0.5

## 2011-11-12 MED ORDER — HYDROMORPHONE HCL PF 1 MG/ML IJ SOLN
0.2500 mg | INTRAMUSCULAR | Status: DC | PRN
  Administered 2011-11-12 (×2): 0.5 mg via INTRAVENOUS
  Administered 2011-11-12: 1 mg via INTRAVENOUS
  Filled 2011-11-12: qty 2

## 2011-11-12 SURGICAL SUPPLY — 56 items
APPLICATOR COTTON TIP 6IN STRL (MISCELLANEOUS) IMPLANT
BANDAGE GAUZE ELAST BULKY 4 IN (GAUZE/BANDAGES/DRESSINGS) ×6 IMPLANT
BENZOIN TINCTURE PRP APPL 2/3 (GAUZE/BANDAGES/DRESSINGS) ×3 IMPLANT
BLADE EXTENDED COATED 6.5IN (ELECTRODE) ×3 IMPLANT
BLADE HEX COATED 2.75 (ELECTRODE) ×3 IMPLANT
CANISTER SUCTION 2500CC (MISCELLANEOUS) IMPLANT
CHLORAPREP W/TINT 26ML (MISCELLANEOUS) ×3 IMPLANT
CLOTH BEACON ORANGE TIMEOUT ST (SAFETY) ×3 IMPLANT
COVER MAYO STAND STRL (DRAPES) IMPLANT
DRAIN CHANNEL 19F RND (DRAIN) ×3 IMPLANT
DRAPE LAPAROSCOPIC ABDOMINAL (DRAPES) ×3 IMPLANT
DRAPE WARM FLUID 44X44 (DRAPE) ×3 IMPLANT
DRSG PAD ABDOMINAL 8X10 ST (GAUZE/BANDAGES/DRESSINGS) ×9 IMPLANT
ELECT REM PT RETURN 9FT ADLT (ELECTROSURGICAL) ×3
ELECTRODE REM PT RTRN 9FT ADLT (ELECTROSURGICAL) ×2 IMPLANT
GLOVE BIO SURGEON STRL SZ7 (GLOVE) ×15 IMPLANT
GLOVE BIOGEL PI IND STRL 7.0 (GLOVE) ×4 IMPLANT
GLOVE BIOGEL PI INDICATOR 7.0 (GLOVE) ×2
GLOVE SURG SS PI 7.5 STRL IVOR (GLOVE) ×6 IMPLANT
GOWN PREVENTION PLUS LG XLONG (DISPOSABLE) ×3 IMPLANT
GOWN STRL NON-REIN LRG LVL3 (GOWN DISPOSABLE) ×3 IMPLANT
GOWN STRL REIN XL XLG (GOWN DISPOSABLE) ×6 IMPLANT
GUIDE PIN THREADED (PIN)
KIT BASIN OR (CUSTOM PROCEDURE TRAY) ×3 IMPLANT
LIGASURE IMPACT 36 18CM CVD LR (INSTRUMENTS) ×3 IMPLANT
MANIFOLD NEPTUNE II (INSTRUMENTS) ×3 IMPLANT
NS IRRIG 1000ML POUR BTL (IV SOLUTION) ×12 IMPLANT
PACK GENERAL/GYN (CUSTOM PROCEDURE TRAY) ×3 IMPLANT
PIN GUIDE THREADED (PIN) IMPLANT
POUCH OSTOMY 2 3/4  H 3804 (WOUND CARE) ×1
POUCH OSTOMY 2 PC DRNBL 2.75 (WOUND CARE) ×2 IMPLANT
SCALPEL HARMONIC ACE (MISCELLANEOUS) IMPLANT
SEPRAFILM MEMBRANE 5X6 (MISCELLANEOUS) ×6 IMPLANT
SHEARS FOC LG CVD HARMONIC 17C (MISCELLANEOUS) IMPLANT
SPONGE GAUZE 4X4 12PLY (GAUZE/BANDAGES/DRESSINGS) ×3 IMPLANT
SPONGE LAP 18X18 X RAY DECT (DISPOSABLE) ×12 IMPLANT
STAPLER CUT CVD 40MM GREEN (STAPLE) ×3 IMPLANT
STAPLER CUT RELOAD GREEN (STAPLE) ×3 IMPLANT
STAPLER VISISTAT 35W (STAPLE) ×3 IMPLANT
SUCTION POOLE TIP (SUCTIONS) ×3 IMPLANT
SUT ETHILON 2 0 PS N (SUTURE) ×3 IMPLANT
SUT PDS AB 1 CTX 36 (SUTURE) IMPLANT
SUT PDS AB 1 TP1 96 (SUTURE) ×6 IMPLANT
SUT SILK 2 0 (SUTURE) ×1
SUT SILK 2 0 SH CR/8 (SUTURE) IMPLANT
SUT SILK 2-0 18XBRD TIE 12 (SUTURE) ×2 IMPLANT
SUT SILK 3 0 (SUTURE) ×1
SUT SILK 3 0 SH CR/8 (SUTURE) IMPLANT
SUT SILK 3-0 18XBRD TIE 12 (SUTURE) ×2 IMPLANT
SUT VIC AB 2-0 SH 18 (SUTURE) ×3 IMPLANT
SUT VIC AB 3-0 SH 18 (SUTURE) ×12 IMPLANT
TAPE CLOTH SURG 6X10 WHT LF (GAUZE/BANDAGES/DRESSINGS) ×3 IMPLANT
TOWEL OR 17X26 10 PK STRL BLUE (TOWEL DISPOSABLE) ×6 IMPLANT
TRAY FOLEY CATH 14FRSI W/METER (CATHETERS) IMPLANT
YANKAUER SUCT BULB TIP 10FT TU (MISCELLANEOUS) ×3 IMPLANT
YANKAUER SUCT BULB TIP NO VENT (SUCTIONS) ×3 IMPLANT

## 2011-11-12 NOTE — Transfer of Care (Signed)
Immediate Anesthesia Transfer of Care Note  Patient: [name]  Procedure(s) Performed: Procedure(s) (LRB): EXPLORATORY LAPAROTOMY (N/A) COLON RESECTION SIGMOID (N/A) INCISION AND DRAINAGE ABSCESS (N/A)  Patient Location: PACU  Anesthesia Type: General  Level of Consciousness: sedated and patient cooperative  Airway & Oxygen Therapy: Patient remains intubated per anesthesia plan  Post-op Assessment: Report given to PACU RN and Post -op Vital signs reviewed and stable  Post vital signs: Reviewed and stable  Complications: No apparent anesthesia complications

## 2011-11-12 NOTE — Progress Notes (Signed)
Name: [name] MRN: [id number] DOB: 11/28/1933    LOS: 1  Referring Provider:  EDP Reason for Referral:  shock  PULMONARY / CRITICAL CARE MEDICINE  BRIEF PROFILE:  52 yowf with steroid dep obstructive lung disease, CHF, CAF, divverticulitis adm by PCCM via [facility] am 5/24 with increased abd pain and hypotension. CT abd c/w free air and divertular abscess. Ex-lap 5/24 PM revealed perforated sigmoid diverticulitis. Underwent sigmoid colon resection and abscess drainage (Dr Biagio Quint). Remained intubated post op     SUBJ:  Passed SBT. RASS 0. CAM-ICU negative  Vital Signs: Temp:  [97.9 F (36.6 C)-103 F (39.4 C)] 98 F (36.7 C) (05/25 1200) Pulse Rate:  [29-134] 88  (05/25 1200) Resp:  [14-31] 16  (05/25 1230) BP: (101-169)/(35-88) 157/70 mmHg (05/25 1200) SpO2:  [89 %-100 %] 97 % (05/25 1230) FiO2 (%):  [1 %-100 %] 35 % (05/25 1100) Weight:  [51.2 kg (112 lb 14 oz)] 51.2 kg (112 lb 14 oz) (05/25 0915)  Physical Examination: HEENT: WNL Neck: No JVD Chest: no wheeze Heart: IRIR, no murmur Abd: soft, diminished BS, diffusely mildly tender Ext: warm, no edema Neuro: no focal deficits  Active Problems:  Respiratory failure following trauma and surgery  Chronic obstructive asthma  Perforated diverticulitis  Peritonitis  Hypotension  Atrial fibrillation  HYPOTHYROIDISM  HEART FAILURE  History of hypertension   ASSESSMENT AND PLAN  PULMONARY Post op resp failure - Passed SBT, extubated and looks great - routine post extubation care COPD, treated as asthma as outpt (Xolair, Singulair, Pred are chronic meds) - Scheduled nebulized BDs and steroids - decrease "stress dose" steroids   CARDIOVASCULAR Cardiology consult 5/24 ([place]) A: Afib P: Per cards      Amiodarone started 5/24  RENAL  Lab 11/11/11 1035  NA 131*  K 4.0  CL 97  CO2 23  BUN 23  CREATININE 0.98  CALCIUM 9.2  MG --  PHOS --   Intake/Output      05/24 0701 - 05/25 0700 05/25 0701 -  05/26 0700   I.V. (mL/kg) 3412.9 500 (9.8)   Blood 1820.6    Other  180   IV Piggyback 50 27   Total Intake(mL/kg) 5283.5 707 (13.8)   Urine (mL/kg/hr) 2000 190 (0.5)   Drains  140   Blood 500    Total Output 2500 330   Net +2783.5 +377         Foley:  In ER 5/24  A:  No issues P:  Monitor uop  GASTROINTESTINAL  Lab 11/11/11 1035  AST 15  ALT 9  ALKPHOS 64  BILITOT 0.4  PROT 6.6  ALBUMIN 2.9*   Diet : NP0   A:  Post op ileus - Nutrition per CCS - PCA ordered for analgesia   HEMATOLOGIC  Lab 11/12/11 0221 11/12/11 0025 11/11/11 2300 11/11/11 1035  HGB -- -- -- 11.9*  HCT -- -- -- 36.8  PLT -- -- -- 262  INR 1.45 1.72* 1.99* 3.29*  APTT -- -- -- --   A:  Coagulopathic from coumadin Resume warfarin for CAF when safe to do so per CCS    INFECTIOUS  Lab 11/11/11 1035  WBC 13.5*  PROCALCITON 0.94   Cultures: UC 5/24 >> BC x2 5/24 >>  Antibiotics: Vancomycin  5/24 only Zosyn 5/24 >>  A:  Diverticular abscess/ low grade sepsis  P:  Zosyn per dashboard  ENDOCRINE No results found for this basename: GLUCAP:5 in the last 168 hours A:  No issues    NEUROLOGIC  A:  Dementia dx pre op, Pos risk delirium P:  Minimize sedation/ hypnotics  BEST PRACTICE / DISPOSITION - Level of Care: ICU - Primary Service:  PCCM - Consultants:  CCS/ gen surgery - Code Status:  FUll - Diet:  See GI - DVT Px: PAS - GI Px:  PPI    CCM X 35 mins  Billy Fischer, MD;  PCCM service; Mobile [phone]

## 2011-11-12 NOTE — Progress Notes (Signed)
eLink Physician-Brief Progress Note Patient Name: Xana Bradt Leger DOB: 1934-03-13 MRN: [id number]  Date of Service  11/12/2011   HPI/Events of Note   Post op vent  eICU Interventions  Sedation/vent orders given, PCXR ordered for ETT position   Intervention Category Major Interventions: Respiratory failure - evaluation and management  Vonita Calloway V. 11/12/2011, 7:00 AM

## 2011-11-12 NOTE — Progress Notes (Signed)
Pt extubated per MD at 1145 to 4L [place] with only mild stridor - no distress - reduced to 2L [place] at this time. BBS scatt rhonchi, strong NPC, Sats 100%, Hr 88 at this time.  RT will continue to monitor.

## 2011-11-12 NOTE — Progress Notes (Signed)
Immediately postop hartmanns procedure.  She is mae, alert, hopefully can get extubated today. Once extubated can start pca/pulm toilet Cont abx/npo Check labs am tomorrow

## 2011-11-12 NOTE — Op Note (Signed)
NAME:  [name], [name]                  ACCOUNT NO.:  [id number]  MEDICAL RECORD NO.:  [id number]  LOCATION:  [id number]                         FACILITY:  [facility]  PHYSICIAN:  Lodema Pilot, MD       DATE OF BIRTH:  March 18, 1934  DATE OF PROCEDURE:  11/12/2011 DATE OF DISCHARGE:                              OPERATIVE REPORT   PROCEDURE:  Exploratory laparotomy with sigmoid colectomy with end- colostomy and drainage of intra-abdominal abscesses.  PREOPERATIVE DIAGNOSIS:  Perforated diverticulitis.  POSTOPERATIVE DIAGNOSIS:  Perforated diverticulitis.  SURGEON:  Lodema Pilot, MD.  ASSISTANT:  Dr. Janee Morn.  ANESTHESIA:  General endotracheal anesthesia.  FLUIDS:  1200 mL of crystalloid and 1 unit of FFP.  ESTIMATED BLOOD LOSS:  About 200 mL.  DRAINS:  A 19-French Blake drain placed in the area of the pelvic abscess cavity.  SPECIMENS:  Sigmoid colectomy sent to Pathology for permanent section.  COMPLICATIONS:  None apparent.  FINDINGS:  Perforated sigmoid diverticulitis with pericolonic abscess and interloop abscess end-colostomy.  INDICATION FOR PROCEDURE:  [name] is a 78 year old female, who presented to the emergency room with peritonitis and a history and physical exam consistent with sigmoid diverticulitis.  She had free air on a CT scan as well as some inflammatory changes and abscess in the area of the sigmoid colon.  She was supratherapeutic on her Coumadin and she was admitted for fluid resuscitation, antibiotics, and reversal of her anticoagulation in preparation for surgery.  OPERATIVE DETAILS:  [name] was seen and evaluated in the intensive care unit, and I discussed with the patient and her family the risks and benefits of the procedure and informed consent was obtained.  She was on therapeutic antibiotics and she was given several units of fresh frozen plasma for reversal of her supratherapeutic Coumadin levels.  After her INR was less than 1.5, she was taken to the  operating room, placed on the table in the supine position, and general endotracheal tube anesthesia was obtained.  A central line was placed by Anesthesia, and her abdomen was prepped and draped in a standard surgical fashion.  A procedure timeout was performed with all operative team members to confirm proper patient and procedure and prior to incision you could actually feel a lower midline mass consistent with the inflammatory phlegmon.  A midline incision was made in the skin and dissection carried down through the subcutaneous tissue using Bovie electrocautery. The abdominal wall fascia was entered and elevated with Kocher clamps and I bluntly freed up some of the abdominal wall  adhesions and thickened adhesions with Bovie electrocautery.  There were some omental adhesions to the infraumbilical region likely from her prior umbilical hernia repair.  I did not encounter any mesh and while opening the midline.  This was carried down towards the pelvis and adnexa.  She had a foul-smelling murky fluid in her abdomen and an inflammatory mass in the area of the sigmoid colon.  She had an interloop abscess, which was broken up and drained and a loop of small intestine, which was adhered to the sigmoid colon, this was taken down mostly with blunt dissection and some sharp dissection.  This area  of the small intestine, which was taken off of the colon, appeared to be intact, but I placed some 3-0 Vicryl Lembert sutures over this area in case the area was in doubt from taken this off of the colon.  The remainder of the small bowel was investigated.  She had some adhesions to the right lower quadrant and pelvic sidewall and it almost appeared as though she had been forming an internal hernia from these adhesions and these were taken off the sidewall and the small bowel was run for its entirety and after this was investigated it was packed in the right upper quadrant.  The sigmoid colon was  adhered to the pelvic sidewall and she had an abscess cavity forming to the pelvic sidewall and the abdominal wall and this was broken up and drained.  Then the sigmoid colon was mobilized along the white line of Toldt, freeing up the lateral attachments to the colon. This was carried down to the pelvis to what appeared to be normal- appearing rectum and it was carried up the white line of Toldt towards the splenic flexure.  Just above the area of inflammatory changes and thickening in the sigmoid colon,  a window was created through the mesocolon on the descending colon and the colon was transected with a green load of the contour stapler.  Then using a ligature device, the mesentery to the sigmoid colon was divided high on the colon as to avoid injury to the ureter, which was not visualized due to the inflammatory changes in the area.  This was carried down to the normal-appearing rectum and the distal margin was obtained transecting the rectum with another firing of the green contour stapler.  The distal margin of the specimen was marked with a silk suture and this was sent to Pathology for permanent section.  The rectum was marked with 2-0 Prolene sutures in case she was to come to colostomy takedown.  After we felt that we had mobilized the proximal colon to form tension-free ostomy, the abdomen was irrigated with sterile saline solution until the irrigation returned clear and the NG-tube was placed in the stomach and confirmed to be at a proper position.  There were no other interloop abscesses and we irrigated the pelvis until the irrigation returned clear.  A 19- [place] Blake drain was placed through a separate stab incision in the right lower quadrant and placed in the area of the pelvis, mainly in the left lower quadrant, and tracking up the left sidewall in the area of the abscess.  Then a site was selected in the left abdominal wall for colostomy placement.  This was carried  down to the abdominal wall fascia and a cruciate incision was made in the anterior fascia.  The muscle was separated and the posterior fascia was divided.  The descending colon was brought up through the abdominal wall and sutured to the fascia with 2-0 Vicryl sutures.  Then, we attempted to place some Seprafilm around the colostomy and under the midline, and the midline fascia was approximated with #1 looped PDS suture x2, taking care to avoid injury to underlying bowel contents.  The wound was irrigated with sterile saline solution and the edges were stapled in the central portion of the wound around the umbilicus and in the area adjacent to the ileostomy in order to allow for optimal fit of the ostomy appliance.  The upper portion of the wound and subcutaneous tissue was packed with a saline- dampened Kerlix gauze  as well as the lower portion of the midline wound. Then, the colostomy was matured taking off the prior staple line, and 3- 0 Vicryl sutures were placed at 4 quadrants along the ostomy, taking dermis and seromuscular bites of the colon wall and the full-thickness bites at the edge of the colon, attempting a Brooke colostomy.  This everted the ostomy and then multiple 3-0 Vicryl sutures were placed around the colostomy to the dermis and full thickness colon and the sutures were secured.  The skin was washed and dried and then the ostomy appliance was tailored to fit and a sterile dressing was applied.  All sponge, needle, and instrument counts were correct at the end of the case, and the patient tolerated procedure well without apparent complications.  She was stable and ready for transfer to the Intensive Care Unit in guarded condition.          ______________________________ Lodema Pilot, MD     BL/MEDQ  D:  11/12/2011  T:  11/12/2011  Job:  [id number]

## 2011-11-12 NOTE — Progress Notes (Addendum)
78 yo female s/p exp lap for perforated diverticulitis. +MRSA, on home coumadin with elev INR, had vitamin K and 4 FFP pre-operatively, surgery for intra-abdominal abscesses and sigmoid colostomy on 5/25. Sat up in chair for 4-5 hours today, assisted back to bed by 2 RNs at 2100. States adeq relef from pain with dilaudid PCA, 1250 per incentive spirometer, reminded to TCDB. VSS, NGT to LIS, no acute distress noted. On amiodarone drip at 30 mg/hr.   [name]

## 2011-11-12 NOTE — Brief Op Note (Signed)
11/11/2011 - 11/12/2011  6:42 AM  PATIENT:  [name]  78 y.o. female  PRE-OPERATIVE DIAGNOSIS:  Perforated sigmoid diverticulitis  POST-OPERATIVE DIAGNOSIS:  Perforated sigmoid diverticulitis  PROCEDURE:  Procedure(s) (LRB): EXPLORATORY LAPAROTOMY (N/A) COLON RESECTION SIGMOID (N/A) INCISION AND DRAINAGE ABSCESS (N/A)  SURGEON:  Surgeon(s) and Role:    * Liz Malady, MD - Assisting    * Lodema Pilot, DO - Primary  PHYSICIAN ASSISTANT:   ASSISTANTS: Thompson   ANESTHESIA:   general  EBL:  Total I/O In: 3783.5 [I.V.:1912.9; Blood:1820.6; IV Piggyback:50] Out: 2500 [Urine:2000; Blood:500]  BLOOD ADMINISTERED:1 FFP  DRAINS: (70f ) Jackson-Pratt drain(s) with closed bulb suction in the abscess cavity   LOCAL MEDICATIONS USED:  NONE  SPECIMEN:  Source of Specimen:  sigmoidcolectomy  DISPOSITION OF SPECIMEN:  PATHOLOGY  COUNTS:  YES  TOURNIQUET:  * No tourniquets in log *  DICTATION: .Other Dictation: Dictation Number [id number]  PLAN OF CARE: Admit to inpatient   PATIENT DISPOSITION:  ICU - intubated and critically ill.   Delay start of Pharmacological VTE agent (>24hrs) due to surgical blood loss or risk of bleeding: no

## 2011-11-12 NOTE — Anesthesia Preprocedure Evaluation (Addendum)
Anesthesia Evaluation  Patient identified by MRN, date of birth, ID band Patient awake    Reviewed: Allergy & Precautions, H&P , NPO status , Patient's Chart, lab work & pertinent test results  Airway Mallampati: III TM Distance: <3 FB Neck ROM: Full    Dental No notable dental hx.    Pulmonary neg pulmonary ROS, COPD breath sounds clear to auscultation  Pulmonary exam normal       Cardiovascular hypertension, Pt. on medications + dysrhythmias Atrial Fibrillation + pacemaker Rhythm:Regular Rate:Normal     Neuro/Psych negative neurological ROS  negative psych ROS   GI/Hepatic Neg liver ROS, GERD-  ,  Endo/Other  Hypothyroidism   Renal/GU negative Renal ROS  negative genitourinary   Musculoskeletal negative musculoskeletal ROS (+)   Abdominal   Peds negative pediatric ROS (+)  Hematology negative hematology ROS (+)   Anesthesia Other Findings   Reproductive/Obstetrics negative OB ROS                          Anesthesia Physical Anesthesia Plan  ASA: III and Emergent  Anesthesia Plan: General   Post-op Pain Management:    Induction: Intravenous, Rapid sequence and Cricoid pressure planned  Airway Management Planned: Oral ETT  Additional Equipment: CVP  Intra-op Plan:   Post-operative Plan: Post-operative intubation/ventilation and Possible Post-op intubation/ventilation  Informed Consent: I have reviewed the patients History and Physical, chart, labs and discussed the procedure including the risks, benefits and alternatives for the proposed anesthesia with the patient or authorized representative who has indicated his/her understanding and acceptance.   Dental advisory given  Plan Discussed with: CRNA  Anesthesia Plan Comments:        Anesthesia Quick Evaluation

## 2011-11-12 NOTE — Progress Notes (Signed)
Subjective:  Currently intubated but awake.  C/o some SOB.  No chest pain.  Tolerated OR well  Objective:  Vital Signs in the last 24 hours: BP 119/53  Pulse 90  Temp(Src) 97.9 F (36.6 C) (Oral)  Resp 19  SpO2 98%  Physical Exam: Elderly WF intubated Lungs:  Rhonchi bilat Cardiac:  Regular rhythm, normal S1 and S2, no S3 Abdomen: Dressings present Extremities:  No edema present  Intake/Output from previous day: 05/24 0701 - 05/25 0700 In: 5283.5 [I.V.:3412.9; Blood:1820.6; IV Piggyback:50] Out: 2500 [Urine:2000; Blood:500] Weight There were no vitals filed for this visit.  Lab Results: Basic Metabolic Panel:  Basename 11/11/11 1035  NA 131*  K 4.0  CL 97  CO2 23  GLUCOSE 86  BUN 23  CREATININE 0.98    CBC:  Basename 11/11/11 1035  WBC 13.5*  NEUTROABS --  HGB 11.9*  HCT 36.8  MCV 92.0  PLT 262    BNP    Component Value Date/Time   PROBNP 3733.0* 01/14/2011 0515    PROTIME: Lab Results  Component Value Date   INR 1.45 11/12/2011   INR 1.72* 11/12/2011   INR 1.99* 11/11/2011   PROTIME 16.5 11/28/2008    Telemetry: Appears normal sinus at present with ventricular pacing  Assessment/Plan:  1. Paroxysmal atrial fibrillation 2. Cardiomyopathy with EF 30-35% 3. Perm pacer  Rec:  Restart warfarin when able to take po meds. Continue IV amiodarone for time being. Watch volume and output with EF depressed.  Darden Palmer  MD [facility]  11/12/2011, 8:06 AM

## 2011-11-13 LAB — PREPARE FRESH FROZEN PLASMA: Unit division: 0

## 2011-11-13 LAB — CBC
Hemoglobin: 8.3 g/dL — ABNORMAL LOW (ref 12.0–15.0)
MCH: 29.5 pg (ref 26.0–34.0)
MCV: 90.7 fL (ref 78.0–100.0)
RBC: 2.81 MIL/uL — ABNORMAL LOW (ref 3.87–5.11)

## 2011-11-13 LAB — BASIC METABOLIC PANEL
BUN: 22 mg/dL (ref 6–23)
CO2: 26 mEq/L (ref 19–32)
Calcium: 7.9 mg/dL — ABNORMAL LOW (ref 8.4–10.5)
Creatinine, Ser: 0.92 mg/dL (ref 0.50–1.10)
Glucose, Bld: 130 mg/dL — ABNORMAL HIGH (ref 70–99)

## 2011-11-13 LAB — URINE CULTURE: Culture  Setup Time: 201305250115

## 2011-11-13 MED ORDER — KCL IN DEXTROSE-NACL 40-5-0.45 MEQ/L-%-% IV SOLN
INTRAVENOUS | Status: DC
  Administered 2011-11-13: 50 mL via INTRAVENOUS
  Administered 2011-11-14 (×2): 50 mL/h via INTRAVENOUS
  Administered 2011-11-16: 50 mL via INTRAVENOUS
  Administered 2011-11-16: 50 mL/h via INTRAVENOUS
  Administered 2011-11-17 – 2011-11-19 (×2): via INTRAVENOUS
  Filled 2011-11-13 (×10): qty 1000

## 2011-11-13 MED ORDER — FUROSEMIDE 10 MG/ML IJ SOLN
40.0000 mg | Freq: Every day | INTRAMUSCULAR | Status: DC
  Administered 2011-11-13 – 2011-11-15 (×3): 40 mg via INTRAVENOUS
  Filled 2011-11-13 (×4): qty 4

## 2011-11-13 MED ORDER — KCL IN DEXTROSE-NACL 40-5-0.45 MEQ/L-%-% IV SOLN
INTRAVENOUS | Status: AC
  Filled 2011-11-13: qty 1000

## 2011-11-13 MED ORDER — HYDROCORTISONE SOD SUCCINATE 100 MG IJ SOLR
25.0000 mg | Freq: Two times a day (BID) | INTRAMUSCULAR | Status: DC
  Administered 2011-11-14: 25 mg via INTRAVENOUS
  Filled 2011-11-13 (×2): qty 0.5

## 2011-11-13 MED ORDER — ENOXAPARIN SODIUM 40 MG/0.4ML ~~LOC~~ SOLN
40.0000 mg | SUBCUTANEOUS | Status: DC
  Administered 2011-11-13 – 2011-11-18 (×6): 40 mg via SUBCUTANEOUS
  Filled 2011-11-13 (×6): qty 0.4

## 2011-11-13 NOTE — Progress Notes (Signed)
1 Day Post-Op  Subjective: Complains of left hand swelling, otherwise pain controlled, oob for five hours yesterday  Objective: Vital signs in last 24 hours: Temp:  [97.6 F (36.4 C)-99.1 F (37.3 C)] 98.5 F (36.9 C) (05/26 0400) Pulse Rate:  [29-100] 100  (05/26 0500) Resp:  [12-26] 18  (05/26 0500) BP: (127-161)/(51-88) 147/58 mmHg (05/26 0500) SpO2:  [92 %-100 %] 96 % (05/26 0500) FiO2 (%):  [21 %-100 %] 21 % (05/25 1614) Weight:  [111 lb 1.8 oz (50.4 kg)-112 lb 14 oz (51.2 kg)] 111 lb 1.8 oz (50.4 kg) (05/26 0500) Last BM Date: 11/11/11  Intake/Output from previous day: 05/25 0701 - 05/26 0700 In: 2114.2 [I.V.:1872.2; IV Piggyback:62] Out: 560 [Urine:290; Drains:270] Intake/Output this shift:    General appearance: no distress Resp: diminished breath sounds bibasilar Cardio: regular rate and rhythm GI: dressing dry, ostomy pink no output  Lab Results:   Basename 11/13/11 0530 11/11/11 1035  WBC 17.2* 13.5*  HGB 8.3* 11.9*  HCT 25.5* 36.8  PLT 224 262   BMET  Basename 11/13/11 0530 11/11/11 1035  NA 136 131*  K 3.5 4.0  CL 103 97  CO2 26 23  GLUCOSE 130* 86  BUN 22 23  CREATININE 0.92 0.98  CALCIUM 7.9* 9.2   PT/INR  Basename 11/12/11 0221 11/12/11 0025  LABPROT 17.9* 20.5*  INR 1.45 1.72*   ABG  Basename 11/11/11 1055  PHART --  HCO3 25.9*    Studies/Results: Ct Abdomen Pelvis W Contrast  11/11/2011  *RADIOLOGY REPORT*  Clinical Data: Fever and abdominal pain.  CT ABDOMEN AND PELVIS WITH CONTRAST  Technique:  Multidetector CT imaging of the abdomen and pelvis was performed following the standard protocol during bolus administration of intravenous contrast.  Contrast: OMNIPAQUE IOHEXOL 300 MG/ML  SOLN  Comparison: Abdominal CT 07/28/2011  Findings: Lung bases again demonstrate scarring along the right major fissure at the right lung base.  The patient has a large amount of free intraperitoneal air.  There is mesenteric edema and inflammatory  changes centered around the mid and left anterior pelvis.  There are multiple locules of extraluminal gas in this location which appear to be adjacent to the sigmoid colon.  The gas collection roughly measures 3.5 x 3.7 cm.  There is an adjacent air- fluid collection that roughly measures 3.6 x 2.0 cm on sequence 6, image 53.  There is gas within the adjacent urinary bladder but this is probably related to the Foley catheter.  There is slightly complex fluid in the pelvic cul-de-sac which could also represent an abscess formation.  Again noted are scattered low density areas throughout the liver which are probably associated with cysts and possibly hemangiomas. No gross abnormality to the portal venous system or gallbladder. Again noted are calcifications within the spleen.  There is increased perinephric edema bilaterally.  Again noted is a hyperdense exophytic structure along the upper pole of the left kidney that roughly measures 1.2 cm.  This structure  measured 1.0 cm in 2006 and probably represents a hyperdense cyst.  3.4 cm low density left renal cyst.  Again seen is a 8 mm low density structure in the posterior right kidney which could represent a small angiolipoma.  No gross abnormality to the adrenal glands or pancreas.  Severe degenerative changes in the lumbar spine with marked anterolisthesis at L5-S1.  These findings are similar to the prior examination.  IMPRESSION: The study is positive for free intraperitoneal air.  Large amount of inflammation  in the pelvis with extraluminal air-fluid collections.  The inflammation appears to be associated with the sigmoid colon and related to acute sigmoid diverticulitis.  There are complex abscess collections in the anterior pelvis and suspect early abscess formation in the posterior pelvis.  Multiple low-density structures in the liver are suggestive for cysts and possibly hemangiomas.  Stable hyperdense left renal cyst.  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  These results were called by telephone on 11/11/2011  at  3:44 p.m. to  Dr. Rubin Payor., who verbally acknowledged these results.  Original Report Authenticated By: Richarda Overlie, M.D.   Dg Chest Port 1 View  11/12/2011  *RADIOLOGY REPORT*  Clinical Data: Tube placement.  Line placement.  PORTABLE CHEST - 1 VIEW  Comparison: 01/12/2011  Findings: The patient's right IJ line, tip superior vena cava. Endotracheal tube is in place, tip approximately 3 cm above carina. Nasogastric tube is in place, tip off the film but beyond the esophagogastric junction.  A left-sided pacemaker has leads overlying the right atrium and right ventricle.  Film is very shallow inflation, accentuating cardiac size which is upper limits normal.  There is minimal perihilar atelectasis but no overt edema.  No focal consolidations.  There are old rib fractures.  Degenerative changes are seen in the thoracic spine. Surgical clips overlie the left axillary region.  IMPRESSION:  1.  Cardiomegaly without overt edema. 2.  Bibasilar atelectasis. 3.  Lines and tubes as described.  Original Report Authenticated By: Patterson Hammersmith, M.D.   Dg Abd Acute W/chest  11/11/2011  *RADIOLOGY REPORT*  Clinical Data: Left lower quadrant pain.  Nausea and vomiting.  ACUTE ABDOMEN SERIES (ABDOMEN 2 VIEW & CHEST 1 VIEW)  Comparison: Chest radiograph on 01/12/2011  Findings: Several nondilated small bowel loops are seen which contain air fluid levels in the pelvis.  No dilated bowel loops identified.  No evidence of free air.  Low lung volumes are again seen, however both lungs are clear. Heart size is within normal limits.  Dual lead transvenous pacemaker remains in appropriate position.  Surgical clips again noted in the left chest wall.  IMPRESSION:  1.  Nonspecific, nonobstructive bowel gas pattern. 2.  No active cardiopulmonary disease.  Original Report Authenticated By: Danae Orleans, M.D.    Anti-infectives: Anti-infectives     Start      Dose/Rate Route Frequency Ordered Stop   11/11/11 2000  piperacillin-tazobactam (ZOSYN) IVPB 3.375 g       3.375 g 12.5 mL/hr over 240 Minutes Intravenous Every 8 hours 11/11/11 1928     11/11/11 1130  piperacillin-tazobactam (ZOSYN) IVPB 3.375 g       3.375 g 100 mL/hr over 30 Minutes Intravenous  Once 11/11/11 1050 11/11/11 1200   11/11/11 1130   vancomycin (VANCOCIN) IVPB 1000 mg/200 mL premix        1,000 mg 200 mL/hr over 60 Minutes Intravenous  Once 11/11/11 1050 11/11/11 1341          Assessment/Plan: POD 1 hartmanns 1. pca 2. pulm toilet 3. Npo, ngt may clamp and remove tomorrow as not much output, await ileus resolve 4. Cont abx for 7 days postop 5. Lovenox, scds   LOS: 2 days    [facility] 11/13/2011

## 2011-11-13 NOTE — Progress Notes (Signed)
Subjective:  Extubated, denies SOB.  C/o left arm swelling at hand. No chest pain.  Objective:  Vital Signs in the last 24 hours: BP 142/65  Pulse 86  Temp(Src) 98.5 F (36.9 C) (Oral)  Resp 14  Ht 5\' 3"  (1.6 m)  Wt 50.4 kg (111 lb 1.8 oz)  BMI 19.68 kg/m2  SpO2 100%  Physical Exam: Pleasant WF in NAD Lungs:  Rhonchi bilat Cardiac:  irgular rhythm, normal S1 and S2, no S3 Abdomen: Dressings present Extremities:  Left hand with some swelling, rings mildly tight.  Mild mottling of index finger.  Pulse present  Intake/Output from previous day: 05/25 0701 - 05/26 0700 In: 2305.4 [I.V.:2063.4; IV Piggyback:62] Out: 1070 [Urine:590; Emesis/NG output:150; Drains:330] Weight Filed Weights   11/12/11 0915 11/13/11 0500  Weight: 51.2 kg (112 lb 14 oz) 50.4 kg (111 lb 1.8 oz)    Lab Results: Basic Metabolic Panel:  Basename 11/13/11 0530 11/11/11 1035  NA 136 131*  K 3.5 4.0  CL 103 97  CO2 26 23  GLUCOSE 130* 86  BUN 22 23  CREATININE 0.92 0.98    CBC:  Basename 11/13/11 0530 11/11/11 1035  WBC 17.2* 13.5*  NEUTROABS -- --  HGB 8.3* 11.9*  HCT 25.5* 36.8  MCV 90.7 92.0  PLT 224 262    BNP    Component Value Date/Time   PROBNP 3733.0* 01/14/2011 0515    PROTIME: Lab Results  Component Value Date   INR 1.45 11/12/2011   INR 1.72* 11/12/2011   INR 1.99* 11/11/2011   PROTIME 16.5 11/28/2008    Telemetry: Sinus with occasional atrial paced  Assessment/Plan:  1. Paroxysmal atrial fibrillation currently sinus with some a pacing on IV amiodarone 2. Cardiomyopathy with EF 30-35% 3. Perm pacer 4.  Edema of left hand, ?mechanical although she has this at home and takes Lasix fo it  Rec:  Restart warfarin when able to take po meds. Continue IV amiodarone for time being. IV Lasix for hand swelling  W. Ashley Royalty  MD [facility], [place] [facility]  11/13/2011, 8:18 AM

## 2011-11-13 NOTE — Progress Notes (Signed)
C/O L hand swelling. Saline Lock removed, BP cuff changed to R arm. Pulses present. MD notified in the "black box".

## 2011-11-13 NOTE — Consult Note (Addendum)
WOC ostomy consult  Stoma type/location: left lower quadrant colostomy Stomal assessment/size: visualized through pouch, approximately 15/8 inches round Peristomal assessment: not visualized Treatment options for stomal/peristomal skin: none indicated Output none in pouch at this time Ostomy pouching: 1pc. drainable pouch with  Convexity [phone] Education provided: Extended session with patient and spouse to inform them of ostomy nurse role and function in the post operative course.  Pouch and stoma characteristics taught, patient expressed concern that pouches might not be odor-proof and that she might have an odor.  Assurance provided. Diet and activity reviewed.  Plan to re-visit on Tuesday, 5/28. I will follow.  Please re-consult if needed prior to Tuesday's planned visit. Thanks, Ladona Mow, MSN, RN, GNP, CWOCN [phone]) Addendum:  No stool in pouch, patient still in ICU.  Will change visit to Wednesday, 5/29. Thanks, Ladona Mow, MSN, RN, [facility], CWOCN [phone])

## 2011-11-13 NOTE — Progress Notes (Signed)
Name: [name] MRN: [id number] DOB: [birth year]    LOS: 2  Referring Provider:  EDP Reason for Referral:  shock  PULMONARY / CRITICAL CARE MEDICINE  BRIEF PROFILE:  40 yowf with steroid dep obstructive lung disease, CHF, CAF, divverticulitis adm by PCCM via [facility] am 5/24 with increased abd pain and hypotension. CT abd c/w free air and divertular abscess. Ex-lap 5/24 PM revealed perforated sigmoid diverticulitis. Underwent sigmoid colon resection and abscess drainage (Dr Biagio Quint). Remained intubated post op     SUBJ:  Up in chair. Good spirits. No distress. C/O mild post-op abd pain and LBP  Vital Signs: Temp:  [97.4 F (36.3 C)-99.9 F (37.7 C)] 98.7 F (37.1 C) (05/26 1600) Pulse Rate:  [29-100] 87  (05/26 1700) Resp:  [12-26] 17  (05/26 1731) BP: (127-161)/(51-85) 155/79 mmHg (05/26 1700) SpO2:  [92 %-100 %] 94 % (05/26 1731) Weight:  [50.4 kg (111 lb 1.8 oz)] 50.4 kg (111 lb 1.8 oz) (05/26 0500)  Physical Examination: HEENT: WNL Neck: No JVD Chest: no wheeze Heart: IRIR, no murmur Abd: soft, diminished BS, diffusely mildly tender Ext: warm, no edema Neuro: no focal deficits  Active Problems:  Respiratory failure following trauma and surgery  Chronic obstructive asthma  Perforated diverticulitis  Peritonitis  Hypotension  Atrial fibrillation  HYPOTHYROIDISM  HEART FAILURE  History of hypertension   ASSESSMENT AND PLAN  PULMONARY Post op resp failure - Tolerating extubation well.  - routine post extubation care COPD, treated as asthma as outpt (Xolair, Singulair, Pred are chronic meds) - Scheduled nebulized BDs and steroids - decrease "stress dose" steroids further   CARDIOVASCULAR Cardiology consult 5/24  A: Afib P: Per cards      Amiodarone started 5/24  RENAL  Lab 11/13/11 0530 11/11/11 1035  NA 136 131*  K 3.5 4.0  CL 103 97  CO2 26 23  BUN 22 23  CREATININE 0.92 0.98  CALCIUM 7.9* 9.2  MG -- --  PHOS -- --   Intake/Output    05/25 0701 - 05/26 0700 05/26 0701 - 05/27 0700   I.V. (mL/kg) 2063.4 (40.9) 435 (8.6)   Blood     Other 180    IV Piggyback 99.5 66.5   Total Intake(mL/kg) 2342.9 (46.5) 501.5 (10)   Urine (mL/kg/hr) 590 (0.5) 1255 (2.2)   Emesis/NG output 150    Drains 330 20   Blood     Total Output 1070 1275   Net +1272.9 -773.5         Foley:  In ER 5/24  A:  No issues P:  Monitor uop  GASTROINTESTINAL  Lab 11/11/11 1035  AST 15  ALT 9  ALKPHOS 64  BILITOT 0.4  PROT 6.6  ALBUMIN 2.9*   Diet : NP0   A:  Post op ileus - Nutrition per CCS - Cont PCA as ordered for analgesia   HEMATOLOGIC  Lab 11/13/11 0530 11/12/11 0221 11/12/11 0025 11/11/11 2300 11/11/11 1035  HGB 8.3* -- -- -- 11.9*  HCT 25.5* -- -- -- 36.8  PLT 224 -- -- -- 262  INR -- 1.45 1.72* 1.99* 3.29*  APTT -- -- -- -- --   A:  Coagulopathic from coumadin Resume warfarin for CAF when safe to do so per CCS    INFECTIOUS  Lab 11/13/11 0530 11/11/11 1035  WBC 17.2* 13.5*  PROCALCITON -- 0.94   Cultures: UC 5/24 >> BC x2 5/24 >>  Antibiotics: Vancomycin  5/24 only Zosyn 5/24 >>  A:  Diverticular abscess/ low grade sepsis  P:  Zosyn per dashboard  ENDOCRINE No results found for this basename: GLUCAP:5 in the last 168 hours A:  No issues    NEUROLOGIC  A:  Dementia dx pre op P:  Minimize sedation/ hypnotics  BEST PRACTICE / DISPOSITION - Level of Care: ICU - Primary Service:  PCCM - Consultants:  CCS/ gen surgery - Code Status:  FUll - Diet:  See GI - DVT Px: PAS - GI Px:  PPI     Billy Fischer, MD;  PCCM service; Mobile [phone]

## 2011-11-13 NOTE — Anesthesia Postprocedure Evaluation (Signed)
  Anesthesia Post-op Note  Patient: [name]  Procedure(s) Performed: Procedure(s) (LRB): EXPLORATORY LAPAROTOMY (N/A) COLON RESECTION SIGMOID (N/A) INCISION AND DRAINAGE ABSCESS (N/A)  Patient Location: PACU  Anesthesia Type: General  Level of Consciousness: sedated  Airway and Oxygen Therapy: mechanically ventilated  Post-op Pain: mild  Post-op Assessment: Post-op Vital signs reviewed, Patient's Cardiovascular Status Stable, Respiratory Function Stable, Patent Airway and No signs of Nausea or vomiting  Post-op Vital Signs: stable  Complications: No apparent anesthesia complications

## 2011-11-14 DIAGNOSIS — E039 Hypothyroidism, unspecified: Secondary | ICD-10-CM

## 2011-11-14 LAB — CBC
MCHC: 32 g/dL (ref 30.0–36.0)
RDW: 13.8 % (ref 11.5–15.5)

## 2011-11-14 LAB — BASIC METABOLIC PANEL
BUN: 24 mg/dL — ABNORMAL HIGH (ref 6–23)
GFR calc Af Amer: 55 mL/min — ABNORMAL LOW (ref >90)
GFR calc non Af Amer: 48 mL/min — ABNORMAL LOW (ref >90)
Potassium: 3.2 mEq/L — ABNORMAL LOW (ref 3.5–5.1)
Sodium: 140 mEq/L (ref 135–145)

## 2011-11-14 MED ORDER — METHYLPREDNISOLONE SODIUM SUCC 40 MG IJ SOLR
10.0000 mg | Freq: Every day | INTRAMUSCULAR | Status: DC
  Administered 2011-11-14 – 2011-11-15 (×2): 10 mg via INTRAVENOUS
  Filled 2011-11-14 (×2): qty 1
  Filled 2011-11-14: qty 0.25

## 2011-11-14 MED ORDER — TRAZODONE HCL 100 MG PO TABS
100.0000 mg | ORAL_TABLET | Freq: Every day | ORAL | Status: DC
  Administered 2011-11-14 – 2011-11-18 (×5): 100 mg
  Filled 2011-11-14 (×6): qty 1

## 2011-11-14 MED ORDER — LEVALBUTEROL HCL 0.63 MG/3ML IN NEBU
0.6300 mg | INHALATION_SOLUTION | Freq: Four times a day (QID) | RESPIRATORY_TRACT | Status: DC
  Administered 2011-11-15 – 2011-11-17 (×12): 0.63 mg via RESPIRATORY_TRACT
  Filled 2011-11-14 (×15): qty 3

## 2011-11-14 MED ORDER — ZOLPIDEM TARTRATE 5 MG PO TABS
5.0000 mg | ORAL_TABLET | Freq: Every day | ORAL | Status: DC
  Administered 2011-11-14 – 2011-11-18 (×5): 5 mg
  Filled 2011-11-14 (×5): qty 1

## 2011-11-14 MED ORDER — LEVOTHYROXINE SODIUM 100 MCG IV SOLR
37.5000 ug | Freq: Every day | INTRAVENOUS | Status: DC
  Administered 2011-11-14 – 2011-11-19 (×6): 38 ug via INTRAVENOUS
  Filled 2011-11-14 (×6): qty 1.9

## 2011-11-14 MED ORDER — DULOXETINE HCL 30 MG PO CPEP
30.0000 mg | ORAL_CAPSULE | Freq: Two times a day (BID) | ORAL | Status: DC
  Administered 2011-11-14 – 2011-11-20 (×9): 30 mg via ORAL
  Filled 2011-11-14 (×21): qty 1

## 2011-11-14 MED ORDER — TRAZODONE HCL 100 MG PO TABS: 100.0000 mg | ORAL_TABLET | Freq: Every day | ORAL | Status: DC

## 2011-11-14 NOTE — Progress Notes (Addendum)
2 Days Post-Op  Subjective: No real complaints, no flatus/bm, pain fair controlled  Objective: Vital signs in last 24 hours: Temp:  [97.4 F (36.3 C)-99.9 F (37.7 C)] 99.6 F (37.6 C) (05/27 0400) Pulse Rate:  [76-99] 76  (05/27 0200) Resp:  [13-26] 13  (05/27 0400) BP: (129-161)/(54-94) 148/54 mmHg (05/27 0200) SpO2:  [92 %-100 %] 96 % (05/27 0400) Last BM Date: 11/11/11  Intake/Output from previous day: 05/26 0701 - 05/27 0700 In: 2043.6 [I.V.:1927.1; IV Piggyback:116.5] Out: 1710 [Urine:1650; Drains:60] Intake/Output this shift:    General appearance: no distress Resp: diminished breath sounds bibasilar Cardio: irregularly irregular rhythm GI: wound clean without infection, no bs, ostomy pink without any real output yet, approp tender  Lab Results:   Basename 11/14/11 0500 11/13/11 0530  WBC 14.5* 17.2*  HGB 7.9* 8.3*  HCT 24.7* 25.5*  PLT 226 224   BMET  Basename 11/14/11 0500 11/13/11 0530  NA 140 136  K 3.2* 3.5  CL 105 103  CO2 28 26  GLUCOSE 103* 130*  BUN 24* 22  CREATININE 1.10 0.92  CALCIUM 7.6* 7.9*   PT/INR  Basename 11/12/11 0221 11/12/11 0025  LABPROT 17.9* 20.5*  INR 1.45 1.72*   ABG  Basename 11/11/11 1055  PHART --  HCO3 25.9*    Studies/Results: Dg Chest Port 1 View  11/12/2011  *RADIOLOGY REPORT*  Clinical Data: Tube placement.  Line placement.  PORTABLE CHEST - 1 VIEW  Comparison: 01/12/2011  Findings: The patient's right IJ line, tip superior vena cava. Endotracheal tube is in place, tip approximately 3 cm above carina. Nasogastric tube is in place, tip off the film but beyond the esophagogastric junction.  A left-sided pacemaker has leads overlying the right atrium and right ventricle.  Film is very shallow inflation, accentuating cardiac size which is upper limits normal.  There is minimal perihilar atelectasis but no overt edema.  No focal consolidations.  There are old rib fractures.  Degenerative changes are seen in the  thoracic spine. Surgical clips overlie the left axillary region.  IMPRESSION:  1.  Cardiomegaly without overt edema. 2.  Bibasilar atelectasis. 3.  Lines and tubes as described.  Original Report Authenticated By: Patterson Hammersmith, M.D.    Anti-infectives: Anti-infectives     Start     Dose/Rate Route Frequency Ordered Stop   11/11/11 2000   piperacillin-tazobactam (ZOSYN) IVPB 3.375 g        3.375 g 12.5 mL/hr over 240 Minutes Intravenous Every 8 hours 11/11/11 1928     11/11/11 1130   piperacillin-tazobactam (ZOSYN) IVPB 3.375 g        3.375 g 100 mL/hr over 30 Minutes Intravenous  Once 11/11/11 1050 11/11/11 1200   11/11/11 1130   vancomycin (VANCOCIN) IVPB 1000 mg/200 mL premix        1,000 mg 200 mL/hr over 60 Minutes Intravenous  Once 11/11/11 1050 11/11/11 1341          Assessment/Plan: POD 2 Hartmanns  1. pca  2. pulm toilet  3. Npo, ngt may clamp and remove as not much output, await ileus resolve  4. Cont abx for 7 days postop  5. Lovenox, scds 6. Appreciate ccm assistance in her care 7. Can dc foley when ok with ccm  LOS: 3 days    [facility] 11/14/2011

## 2011-11-14 NOTE — Progress Notes (Signed)
Patient ID: [name], female   DOB: [birth year], 78 y.o.   MRN: [id number] Subjective:  Cheerful no chest pain dyspnea or palpitations  Objective:  Vital Signs in the last 24 hours: BP 146/70  Pulse 80  Temp(Src) 99.6 F (37.6 C) (Oral)  Resp 16  Ht 5\' 3"  (1.6 m)  Wt 50.4 kg (111 lb 1.8 oz)  BMI 19.68 kg/m2  SpO2 96%  Physical Exam: Pleasant WF in NAD Lungs:  Rhonchi bilat Cardiac:  irgular rhythm, normal S1 and S2, no S3 Abdomen: Dressings present Extremities:  Left hand with some swelling, rings mildly tight.  Mild mottling of index finger.  Pulse present Pacer under left clavicle  Intake/Output from previous day: 05/26 0701 - 05/27 0700 In: 2369.3 [I.V.:2215.3; IV Piggyback:154] Out: 1815 [Urine:1655; Emesis/NG output:50; Drains:100; Stool:10] Weight Filed Weights   11/12/11 0915 11/13/11 0500  Weight: 51.2 kg (112 lb 14 oz) 50.4 kg (111 lb 1.8 oz)    Lab Results: Basic Metabolic Panel:  Basename 11/14/11 0500 11/13/11 0530  NA 140 136  K 3.2* 3.5  CL 105 103  CO2 28 26  GLUCOSE 103* 130*  BUN 24* 22  CREATININE 1.10 0.92    CBC:  Basename 11/14/11 0500 11/13/11 0530  WBC 14.5* 17.2*  NEUTROABS -- --  HGB 7.9* 8.3*  HCT 24.7* 25.5*  MCV 90.5 90.7  PLT 226 224    BNP    Component Value Date/Time   PROBNP 11055.0* 11/14/2011 0500    PROTIME: Lab Results  Component Value Date   INR 1.45 11/12/2011   INR 1.72* 11/12/2011   INR 1.99* 11/11/2011   PROTIME 16.5 11/28/2008    Telemetry: Sinus with occasional atrial paced  Assessment/Plan:  1. Paroxysmal atrial fibrillation currently sinus p synch pacing  2. Cardiomyopathy with EF 30-35% 3. Perm pacer  Rec:  Restart warfarin when able to take po meds. Continue IV amiodarone for time being. IV lasix started yesterday for hand swelling and DCM 40mg  iv daily  Charlton Haws 8:20 AM 11/14/2011  Cardiology  11/14/2011, 8:18 AM

## 2011-11-14 NOTE — Progress Notes (Signed)
Name: [name] MRN: [id number] DOB: 10/24/1933    LOS: 3  Referring Provider:  EDP Reason for Referral:  shock  PULMONARY / CRITICAL CARE MEDICINE  BRIEF PROFILE:  18 yowf with steroid dep obstructive lung disease, CHF, CAF, divverticulitis adm by PCCM via [facility] am 5/24 with increased abd pain and hypotension. CT abd c/w free air and divertular abscess. Ex-lap 5/24 PM revealed perforated sigmoid diverticulitis. Underwent sigmoid colon resection and abscess drainage (Dr Biagio Quint). Remained intubated post op     SUBJECTIVE:  Still has abd pain especially with coughing.  Breathing okay.  Denies chest pain.  Vital Signs: Temp:  [98.3 F (36.8 C)-99.9 F (37.7 C)] 98.8 F (37.1 C) (05/27 0800) Pulse Rate:  [76-99] 80  (05/27 0600) Resp:  [13-21] 17  (05/27 0843) BP: (129-161)/(54-94) 154/72 mmHg (05/27 0800) SpO2:  [92 %-99 %] 94 % (05/27 0843)  Physical Examination: HEENT: WNL Neck: No JVD Chest: no wheeze Heart: s1s2 no murmur Abd: soft, diminished BS, diffusely mildly tender Ext: warm, no edema Neuro: no focal deficits  Active Problems:  HYPOTHYROIDISM  Atrial fibrillation  HEART FAILURE  History of hypertension  Respiratory failure following trauma and surgery  Chronic obstructive asthma  Perforated diverticulitis  Peritonitis  Hypotension   ASSESSMENT AND PLAN  PULMONARY Post op resp failure>>resolved  COPD, treated as asthma as outpt (Xolair, Singulair, Pred are chronic meds) - Scheduled nebulized BDs and steroids - change to low dose solumedrol 5/27   CARDIOVASCULAR Cardiology consult 5/24   A: Afib, chronic systolic CHF P: Per cards      Amiodarone started 5/24  RENAL  Lab 11/14/11 0500 11/13/11 0530 11/11/11 1035  NA 140 136 131*  K 3.2* 3.5 --  CL 105 103 97  CO2 28 26 23   BUN 24* 22 23  CREATININE 1.10 0.92 0.98  CALCIUM 7.6* 7.9* 9.2  MG -- -- --  PHOS -- -- --   Intake/Output      05/26 0701 - 05/27 0700 05/27 0701 - 05/28 0700     I.V. (mL/kg) 2215.3 (44) 133.4 (2.6)   Other     IV Piggyback 154 12.5   Total Intake(mL/kg) 2369.3 (47) 145.9 (2.9)   Urine (mL/kg/hr) 1655 (1.4)    Emesis/NG output 50    Drains 100    Stool 10    Total Output 1815    Net +554.3 +145.9         Foley:  In ER 5/24  A:  No issues P:  Monitor uop Keep foley in 5/27  GASTROINTESTINAL  Lab 11/11/11 1035  AST 15  ALT 9  ALKPHOS 64  BILITOT 0.4  PROT 6.6  ALBUMIN 2.9*   Diet : NPO   A:  Post op ileus - Nutrition per CCS - Cont PCA as ordered for analgesia   HEMATOLOGIC  Lab 11/14/11 0500 11/13/11 0530 11/12/11 0221 11/12/11 0025 11/11/11 2300 11/11/11 1035  HGB 7.9* 8.3* -- -- -- 11.9*  HCT 24.7* 25.5* -- -- -- 36.8  PLT 226 224 -- -- -- 262  INR -- -- 1.45 1.72* 1.99* 3.29*  APTT -- -- -- -- -- --   A:  Coagulopathic from coumadin Resume warfarin for Chronic AF when safe to do so per CCS    INFECTIOUS  Lab 11/14/11 0500 11/13/11 0530 11/11/11 1035  WBC 14.5* 17.2* 13.5*  PROCALCITON -- -- 0.94   Cultures: UC 5/24 >> BC x2 5/24 >>  Antibiotics: Vancomycin  5/24 only  Zosyn 5/24 >>  A:  Diverticular abscess/ low grade sepsis  P:  Zosyn per dashboard  ENDOCRINE  A:  Hypothyroidism -synthroid  NEUROLOGIC  A:  Dementia dx pre op>>normal mental status 5/27 P:  Minimize sedation/ hypnotics  BEST PRACTICE / DISPOSITION - Level of Care: SDU - Primary Service:  PCCM - Consultants:  CCS/ gen surgery - Code Status:  Full - Diet:  See GI - DVT Px: PAS - GI Px:  PPI  Coralyn Helling, MD [facility] Pulmonary/Critical Care 11/14/2011, 9:22 AM Pager:  [phone] After 3pm call: [phone]

## 2011-11-14 NOTE — Progress Notes (Signed)
Name: [name] MRN: [id number] DOB: 08/11/1933  ELECTRONIC ICU PHYSICIAN NOTE  Problem:  Pt / family requesting restart home meds  Intervention:   Restart trazadone and ambien at hs, restart celexa as soon as able to take it po  Sandrea Hughs 11/14/2011, 8:56 PM

## 2011-11-14 NOTE — Progress Notes (Signed)
CARE MANAGEMENT NOTE 11/14/2011  Patient:  [name],[name]   Account Number:  [id number]  Date Initiated:  11/14/2011  Documentation initiated by:  Mariela Rex  Subjective/Objective Assessment:   Patient with perforated diverticlum required open lap post op resp failure and a.fib requiring iv intervention     Action/Plan:   lives at home/new ostomy pouch. being seen by the [facility] RN, may need hhc   Anticipated DC Date:  11/17/2011   Anticipated DC Plan:  HOME/SELF CARE  In-house referral  NA      DC Planning Services  NA      [facility] Choice  NA   Choice offered to / List presented to:  NA   DME arranged  NA      DME agency  NA     HH arranged  NA      HH agency  NA   Status of service:  In process, will continue to follow Medicare Important Message given?  NA - LOS <3 / Initial given by admissions (If response is "NO", the following Medicare IM given date fields will be blank) Date Medicare IM given:   Date Additional Medicare IM given:    Discharge Disposition:    Per UR Regulation:  Reviewed for med. necessity/level of care/duration of stay  If discussed at Long Length of Stay Meetings, dates discussed:    Comments:  [id number] [name], RN,BSN,CCM No discharge needs present at time of this review Case Management [phone]

## 2011-11-15 ENCOUNTER — Inpatient Hospital Stay (HOSPITAL_COMMUNITY): Payer: Medicare Other

## 2011-11-15 LAB — BASIC METABOLIC PANEL
BUN: 30 mg/dL — ABNORMAL HIGH (ref 6–23)
GFR calc Af Amer: 48 mL/min — ABNORMAL LOW (ref >90)
GFR calc Af Amer: 53 mL/min — ABNORMAL LOW (ref >90)
GFR calc non Af Amer: 42 mL/min — ABNORMAL LOW (ref >90)
GFR calc non Af Amer: 45 mL/min — ABNORMAL LOW (ref >90)
Glucose, Bld: 138 mg/dL — ABNORMAL HIGH (ref 70–99)
Potassium: 3.6 mEq/L (ref 3.5–5.1)
Potassium: 3.8 mEq/L (ref 3.5–5.1)
Sodium: 141 mEq/L (ref 135–145)
Sodium: 140 mEq/L (ref 135–145)

## 2011-11-15 LAB — PREPARE FRESH FROZEN PLASMA
Unit division: 0
Unit division: 0

## 2011-11-15 LAB — CBC
Hemoglobin: 9.5 g/dL — ABNORMAL LOW (ref 12.0–15.0)
MCHC: 31.3 g/dL (ref 30.0–36.0)
Platelets: 347 10*3/uL (ref 150–400)

## 2011-11-15 MED ORDER — FUROSEMIDE 10 MG/ML IJ SOLN
40.0000 mg | Freq: Once | INTRAMUSCULAR | Status: AC
  Administered 2011-11-15: 40 mg via INTRAVENOUS
  Filled 2011-11-15: qty 4

## 2011-11-15 MED ORDER — METHYLPREDNISOLONE SODIUM SUCC 40 MG IJ SOLR
40.0000 mg | Freq: Two times a day (BID) | INTRAMUSCULAR | Status: DC
  Administered 2011-11-15: 40 mg via INTRAVENOUS
  Filled 2011-11-15 (×3): qty 1

## 2011-11-15 MED ORDER — POTASSIUM CHLORIDE 10 MEQ/50ML IV SOLN
10.0000 meq | INTRAVENOUS | Status: AC
  Administered 2011-11-15 (×4): 10 meq via INTRAVENOUS
  Filled 2011-11-15 (×4): qty 50

## 2011-11-15 NOTE — Progress Notes (Signed)
Pt has requested NO TX AT NIGHT!!! Started tx on her but she did not finish it, more than half left in neb cup

## 2011-11-15 NOTE — Progress Notes (Signed)
INITIAL ADULT NUTRITION ASSESSMENT Date: 11/15/2011   Time: 11:15 AM Reason for Assessment: Nutrition risk for dysphagia  ASSESSMENT: Female 78 y.o.  Dx: Ruptured sigmoid, Admit with increased abdominal pain and hypotension  Hx:  Past Medical History  Diagnosis Date  . Insomnia, unspecified   . Osteoarthrosis, unspecified whether generalized or localized, unspecified site   . Unspecified hypothyroidism   . Unspecified essential hypertension   . Atrial fibrillation   . Sciatica   . Anemia   . COPD (chronic obstructive pulmonary disease)   . UTI (urinary tract infection)   . GERD (gastroesophageal reflux disease)   . Pneumonia   . Asthma   . Clostridium difficile colitis   . Diverticulitis of sigmoid colon 11/2010    with abscess/contained perforation  . Dementia     mild  . Dehydration with hyponatremia 12/2010    hospitalized  . Angiodysplasia of stomach     ablated 2010  . Angiodysplasia of colon     ablated 2010  . Vitamin B12 deficiency (dietary) anemia 04/12/2011  . Shingles   . Sarcoma     right hand  . Malignant neoplasm of breast (female), unspecified site     left  . Dupuytren's disease     left hand    Related Meds:  Scheduled Meds:   . antiseptic oral rinse  15 mL Mouth Rinse QID  . budesonide  0.25 mg Nebulization Q6H  . chlorhexidine  15 mL Mouth Rinse BID  . Chlorhexidine Gluconate Cloth  6 each Topical Q0600  . DULoxetine  30 mg Oral BID  . enoxaparin (LOVENOX) injection  40 mg Subcutaneous Q24H  . furosemide  40 mg Intravenous Daily  . HYDROmorphone PCA 0.3 mg/mL   Intravenous Q4H  . ipratropium  0.5 mg Nebulization Q6H  . levalbuterol  0.63 mg Nebulization Q6H  . levothyroxine  38 mcg Intravenous Daily  . methylPREDNISolone (SOLU-MEDROL) injection  10 mg Intravenous Daily  . mupirocin ointment  1 application Nasal BID  . pantoprazole (PROTONIX) IV  40 mg Intravenous Q24H  . piperacillin-tazobactam (ZOSYN)  IV  3.375 g Intravenous Q8H  .  traZODone  100 mg Per Tube QHS  . zolpidem  5 mg Per Tube QHS  . DISCONTD: albuterol  2.5 mg Nebulization Q6H  . DISCONTD: traZODone  100 mg Oral QHS   Continuous Infusions:   . sodium chloride 20 mL/hr (11/14/11 0258)  . amiodarone (NEXTERONE PREMIX) 360 mg/200 mL dextrose 30 mg/hr (11/15/11 0233)  . dextrose 5 % and 0.45 % NaCl with KCl 40 mEq/L 50 mL/hr (11/14/11 2345)   PRN Meds:.acetaminophen, diphenhydrAMINE, naloxone, ondansetron (ZOFRAN) IV, sodium chloride, DISCONTD: albuterol   Ht: 5\' 3"  (160 cm)  Wt: 111 lb 1.8 oz (50.4 kg) *Please check this weight for accuracy.   Ideal Wt: 52.4 kg  % Ideal Wt: 96.5% Wt Readings from Last 10 Encounters:  11/13/11 111 lb 1.8 oz (50.4 kg)  11/13/11 111 lb 1.8 oz (50.4 kg)  11/04/11 163 lb 3.2 oz (74.027 kg)  09/09/11 171 lb 9.6 oz (77.837 kg)  07/26/11 171 lb 6.4 oz (77.747 kg)  06/30/11 175 lb 11.2 oz (79.697 kg)  03/02/11 172 lb 14.4 oz (78.427 kg)  06/07/11 175 lb (79.379 kg)  04/18/11 176 lb (79.833 kg)  03/18/11 173 lb (78.472 kg)   Per patient's husband she has lost about 12 lb over the past 3 weeks.   Body mass index is 19.68 kg/(m^2).  Food/Nutrition Related Hx: Noted patient's NG  tube was pulled today. Discussed patient in rounds. Patient has had nausea. Per patient's husband, patient eating well PTA. He denies patient having any problems with chewing or swallowing.   Labs:  CMP     Component Value Date/Time   NA 141 11/15/2011 0552   K 3.6 11/15/2011 0552   CL 104 11/15/2011 0552   CO2 26 11/15/2011 0552   GLUCOSE 132* 11/15/2011 0552   BUN 30* 11/15/2011 0552   CREATININE 1.23* 11/15/2011 0552   CALCIUM 8.6 11/15/2011 0552   PROT 6.6 11/11/2011 1035   ALBUMIN 2.9* 11/11/2011 1035   AST 15 11/11/2011 1035   ALT 9 11/11/2011 1035   ALKPHOS 64 11/11/2011 1035   BILITOT 0.4 11/11/2011 1035   GFRNONAA 42* 11/15/2011 0552   GFRAA 48* 11/15/2011 0552    Intake/Output Summary (Last 24 hours) at 11/15/11 1117 Last data filed  at 11/15/11 0700  Gross per 24 hour  Intake 1466.8 ml  Output   1880 ml  Net -413.2 ml     Diet Order:  NPO  Supplements/Tube Feeding: none at this time  IVF:    sodium chloride Last Rate: 20 mL/hr (11/14/11 0258)  amiodarone (NEXTERONE PREMIX) 360 mg/200 mL dextrose Last Rate: 30 mg/hr (11/15/11 0233)  dextrose 5 % and 0.45 % NaCl with KCl 40 mEq/L Last Rate: 50 mL/hr (11/14/11 2345)    Estimated Nutritional Needs:   Kcal: 4540-9811 Protein: 62.7-78.4 grams  Fluid: 1 ml per kcal intake   NUTRITION DIAGNOSIS: -Inadequate oral intake (NI-2.1).  Status: Ongoing  RELATED TO: inability to eat  AS EVIDENCE BY: NPO status  MONITORING/EVALUATION(Goals): Diet advancement/ tolerance, weight trends, labs 1. Diet advancement as medically able to meet > 90% of estimated energy needs with positive tolerance.  EDUCATION NEEDS: -No education needs identified at this time  INTERVENTION: 1. Recommend re-weigh patient for accuracy.  2. Recommend diet advancement as medically able to meet > 90% of estimated energy needs.  3. RD to follow for nutirtion plan of care.   Dietitian [phone]  DOCUMENTATION CODES Per approved criteria  -Not Applicable    Iven Finn [facility] 11/15/2011, 11:15 AM

## 2011-11-15 NOTE — Telephone Encounter (Signed)
No return calls from the patient I have left another message if we can be of assistance

## 2011-11-15 NOTE — Progress Notes (Signed)
Pt has refused to get OOB twice today and refused to turn several times. She gives the reason for this refusal that it will take her breath away. I explained that we would assist her in activity but she has continued to refuse. Anthonette Legato

## 2011-11-15 NOTE — Progress Notes (Signed)
Pt requested tx to be given w/a only, took tx off before it was finished

## 2011-11-15 NOTE — Progress Notes (Signed)
Name: [name] MRN: [id number] DOB: 25-May-1934    LOS: 4  Referring Provider:  EDP Reason for Referral:  shock  PULMONARY / CRITICAL CARE MEDICINE  BRIEF PROFILE:  56 yowf with steroid dep obstructive lung disease, CHF, CAF, divverticulitis adm by PCCM via [facility] am 5/24 with increased abd pain and hypotension. CT abd c/w free air and divertular abscess. Ex-lap 5/24 PM revealed perforated sigmoid diverticulitis. Underwent sigmoid colon resection and abscess drainage (Dr Biagio Quint). Remained intubated post op     SUBJECTIVE:  Still has abd pain especially with coughing.  Breathing  A little worse today. Describing what sounds like hallucinations last night.  Vital Signs: Temp:  [98.4 F (36.9 C)-99 F (37.2 C)] 99 F (37.2 C) (05/28 0800) Pulse Rate:  [99-135] 117  (05/28 0400) Resp:  [18-25] 22  (05/28 0432) BP: (131-146)/(60-91) 145/68 mmHg (05/28 0800) SpO2:  [93 %-100 %] 100 % (05/28 0549) FiO2 (%):  [28 %] 28 % (05/28 0220) 4 liters  Physical Examination: HEENT: WNL Neck: No JVD Chest: scattered rhonchi, expiratory wheeze Heart: s1s2 no murmur Abd: soft, diminished BS, diffusely mildly tender Ext: warm, no edema Neuro: no focal deficits  Active Problems:  HYPOTHYROIDISM  Atrial fibrillation  HEART FAILURE  History of hypertension  Respiratory failure following trauma and surgery  Chronic obstructive asthma  Perforated diverticulitis  Peritonitis  Hypotension   ASSESSMENT AND PLAN  PULMONARY  COPD/fixed asthma, (Xolair, Singulair, Pred are chronic meds). Reporting increased WOB on 5/28. Suspect post-op atx and decreased abd compliance from ileus are significant contributors. May also consider edema as contributing factor.  plan - Scheduled nebulized BDs and steroids - changed to low dose solumedrol 5/27-->increase on 5/28 as pulm status worse -CXR 5/28 >> mild interstitial edema  -add flutter Valve - Additional dose of Lasix  5/28  CARDIOVASCULAR Cardiology consult 5/24   A: Afib w/ RVR, chronic systolic CHF (EF 65-78%) P:  -Per cards -Amiodarone started 5/24 -trend BNP and f/u cxr.   RENAL  Lab 11/15/11 0552 11/14/11 0500 11/13/11 0530 11/11/11 1035  NA 141 140 136 131*  K 3.6 3.2* -- --  CL 104 105 103 97  CO2 26 28 26 23   BUN 30* 24* 22 23  CREATININE 1.23* 1.10 0.92 0.98  CALCIUM 8.6 7.6* 7.9* 9.2  MG -- -- -- --  PHOS -- -- -- --   Intake/Output      05/27 0701 - 05/28 0700 05/28 0701 - 05/29 0700   I.V. (mL/kg) 1596.1 (31.7)    IV Piggyback 150    Total Intake(mL/kg) 1746.1 (34.6)    Urine (mL/kg/hr) 1435 (1.2)    Emesis/NG output 200    Drains 245    Stool 0    Total Output 1880    Net -133.9          Foley:  In ER 5/24  A:  AKI. ? Due to diuresis.  P:   -Monitor uop -Keep foley in 5/28 -f/u serial chemistry. Low threshold to stop diuretics if scr cont to rise.   GASTROINTESTINAL  Lab 11/11/11 1035  AST 15  ALT 9  ALKPHOS 64  BILITOT 0.4  PROT 6.6  ALBUMIN 2.9*   Diet : NPO   A:  Post op ileus - Nutrition per CCS - Cont PCA as ordered for analgesia   HEMATOLOGIC  Lab 11/15/11 0552 11/14/11 0500 11/13/11 0530 11/12/11 0221 11/12/11 0025 11/11/11 2300 11/11/11 1035  HGB 9.5* 7.9* 8.3* -- -- --  11.9*  HCT 30.4* 24.7* 25.5* -- -- -- 36.8  PLT 347 226 224 -- -- -- 262  INR -- -- -- 1.45 1.72* 1.99* 3.29*  APTT -- -- -- -- -- -- --   A:  Coagulopathic from coumadin Resume warfarin for Chronic AF when safe to do so per CCS    INFECTIOUS  Lab 11/15/11 0552 11/14/11 0500 11/13/11 0530 11/11/11 1035  WBC 19.4* 14.5* 17.2* 13.5*  PROCALCITON -- -- -- 0.94   Cultures: UC 5/24 >>15K mult orgs.  BC x2 5/24 >>  Antibiotics: Vancomycin  5/24 only Zosyn 5/24 >>  A:  Diverticular abscess due to perforated divertic/ low grade sepsis  P:  Zosyn day 4/x  ENDOCRINE  A:  Hypothyroidism Plan: -synthroid  NEUROLOGIC  A:  Dementia dx pre op>>normal mental  status 5/27, now reporting hallucinations at HS.   P:  -Minimize sedation/ hypnotics   BEST PRACTICE / DISPOSITION - Level of Care: SDU - Primary Service:  PCCM - Consultants:  CCS/ gen surgery - Code Status:  Full - Diet:  See GI - DVT Px: PAS - GI Px:  PPI   Seen on CCM rounds this morning with resident MD or ACNP above.  Pt examined and database reviewed. I agree with above findings, assessment and plan as reflected in the note above.   Billy Fischer, MD;  PCCM service; Mobile [phone]

## 2011-11-15 NOTE — Progress Notes (Signed)
General Surgery Note  LOS: 4 days  POD# 3  Assessment/Plan: 1.  EXPLORATORY LAPAROTOMY, COLON RESECTION SIGMOID INCISION AND DRAINAGE ABSCESS for perforated diverticulitis - 11/12/2011 - Dr. Trude Mcburney  On Zosyn.  WBC up to 19,400.  I pulled NGT, but nothing in ostomy.  Progressing from a surgery standpoint, but she has a lot of medical problems.  2.  HYPOTHYROIDISM    3. Atrial fibrillation - seen by Dr. Burna Forts  On amiodaron 4.  History of HEART FAILURE   5.  History of hypertension  6.  Chronic obstructive asthma.  Her lungs seem to be her primary problem.  7.  DVT proph - on Lovenox 8.  Debate with family about whether the foley should come out.  Subjective:  Feels puny.  Her husband and daughter are at the bedside.  Still SOB and feels weak.  Nothing in ostomy. Objective:   Filed Vitals:   11/15/11 0800  BP: 145/68  Pulse:   Temp: 99 F (37.2 C)  Resp:      Intake/Output from previous day:  05/27 0701 - 05/28 0700 In: 1746.1 [I.V.:1596.1; IV Piggyback:150] Out: 1880 [Urine:1435; Emesis/NG output:200; Drains:245]  Intake/Output this shift:      Physical Exam:   General: Older WF who is alert and oriented.    HEENT: Normal. Pupils equal. .   Lungs: Some rhonchi.   Abdomen: Soft, but mild distention.   Wound: Clean.  Ostomy okay.   Neurologic:  Grossly intact to motor and sensory function.   Psychiatric: Has normal mood and affect. Behavior is normal   Lab Results:    Basename 11/15/11 0552 11/14/11 0500  WBC 19.4* 14.5*  HGB 9.5* 7.9*  HCT 30.4* 24.7*  PLT 347 226    BMET   Basename 11/15/11 0552 11/14/11 0500  NA 141 140  K 3.6 3.2*  CL 104 105  CO2 26 28  GLUCOSE 132* 103*  BUN 30* 24*  CREATININE 1.23* 1.10  CALCIUM 8.6 7.6*    PT/INR  No results found for this basename: LABPROT:2,INR:2 in the last 72 hours  ABG  No results found for this basename: PHART:2,PCO2:2,PO2:2,HCO3:2 in the last 72 hours   Studies/Results:  No results  found.   Anti-infectives:   Anti-infectives     Start     Dose/Rate Route Frequency Ordered Stop   11/11/11 2000  piperacillin-tazobactam (ZOSYN) IVPB 3.375 g       3.375 g 12.5 mL/hr over 240 Minutes Intravenous Every 8 hours 11/11/11 1928     11/11/11 1130  piperacillin-tazobactam (ZOSYN) IVPB 3.375 g       3.375 g 100 mL/hr over 30 Minutes Intravenous  Once 11/11/11 1050 11/11/11 1200   11/11/11 1130   vancomycin (VANCOCIN) IVPB 1000 mg/200 mL premix        1,000 mg 200 mL/hr over 60 Minutes Intravenous  Once 11/11/11 1050 11/11/11 1341          [name], MD, FACS Pager: [phone],   [facility] Office: [phone] 11/15/2011

## 2011-11-15 NOTE — Progress Notes (Signed)
Patient ID: [name], female   DOB: [birth year], 78 y.o.   MRN: [id number] Subjective:  Cheerful no chest pain dyspnea  Had nightmares and got hot with broken thermostat in room  Objective:  Vital Signs in the last 24 hours: BP 141/78  Pulse 117  Temp(Src) 98.8 F (37.1 C) (Oral)  Resp 22  Ht 5\' 3"  (1.6 m)  Wt 50.4 kg (111 lb 1.8 oz)  BMI 19.68 kg/m2  SpO2 100%  Physical Exam: Pleasant WF in NAD Lungs:  Rhonchi bilat Cardiac:  irgular rhythm, normal S1 and S2, no S3 Abdomen: Dressings present Extremities:  Left hand with some swelling, rings mildly tight.  Mild mottling of index finger.  Pulse present Pacer under left clavicle  Intake/Output from previous day: 05/27 0701 - 05/28 0700 In: 1746.1 [I.V.:1596.1; IV Piggyback:150] Out: 1640 [Urine:1395; Drains:245] Weight Filed Weights   11/12/11 0915 11/13/11 0500  Weight: 51.2 kg (112 lb 14 oz) 50.4 kg (111 lb 1.8 oz)    Lab Results: Basic Metabolic Panel:  Basename 11/15/11 0552 11/14/11 0500  NA 141 140  K 3.6 3.2*  CL 104 105  CO2 26 28  GLUCOSE 132* 103*  BUN 30* 24*  CREATININE 1.23* 1.10    CBC:  Basename 11/15/11 0552 11/14/11 0500  WBC 19.4* 14.5*  NEUTROABS -- --  HGB 9.5* 7.9*  HCT 30.4* 24.7*  MCV 90.7 90.5  PLT 347 226    BNP    Component Value Date/Time   PROBNP 11055.0* 11/14/2011 0500    PROTIME: Lab Results  Component Value Date   INR 1.45 11/12/2011   INR 1.72* 11/12/2011   INR 1.99* 11/11/2011   PROTIME 16.5 11/28/2008    Telemetry: Episodes of PAF and pacing  Assessment/Plan:  1. Paroxysmal atrial fibrillation currently sinus p synch pacing  With bursts of PAF 2. Cardiomyopathy with EF 30-35% 3. Perm pacer  Rec:  Restart warfarin when able to take po meds. Continue IV amiodarone for time being. Rebolus IV lasix started yesterday for hand swelling and DCM 40mg  iv daily I/O's equal check BNP  Charlton Haws 7:55 AM 11/15/2011  Cardiology  11/15/2011, 7:55 AM

## 2011-11-16 ENCOUNTER — Inpatient Hospital Stay (HOSPITAL_COMMUNITY): Payer: Medicare Other

## 2011-11-16 ENCOUNTER — Encounter (HOSPITAL_COMMUNITY): Payer: Self-pay | Admitting: General Surgery

## 2011-11-16 DIAGNOSIS — J9600 Acute respiratory failure, unspecified whether with hypoxia or hypercapnia: Secondary | ICD-10-CM

## 2011-11-16 DIAGNOSIS — Z950 Presence of cardiac pacemaker: Secondary | ICD-10-CM

## 2011-11-16 LAB — CBC
Hemoglobin: 9.4 g/dL — ABNORMAL LOW (ref 12.0–15.0)
MCH: 28.8 pg (ref 26.0–34.0)
RBC: 3.26 MIL/uL — ABNORMAL LOW (ref 3.87–5.11)
WBC: 18.4 10*3/uL — ABNORMAL HIGH (ref 4.0–10.5)

## 2011-11-16 LAB — PROCALCITONIN: Procalcitonin: 1.73 ng/mL

## 2011-11-16 LAB — PRO B NATRIURETIC PEPTIDE: Pro B Natriuretic peptide (BNP): >70000 pg/mL — ABNORMAL HIGH (ref 0–450)

## 2011-11-16 MED ORDER — MIDAZOLAM HCL 5 MG/ML IJ SOLN: 0.5000 mg | Freq: Every evening | INTRAMUSCULAR | Status: DC | PRN

## 2011-11-16 MED ORDER — LOSARTAN POTASSIUM 25 MG PO TABS
25.0000 mg | ORAL_TABLET | Freq: Every day | ORAL | Status: DC
  Administered 2011-11-16 – 2011-11-19 (×4): 25 mg via ORAL
  Filled 2011-11-16 (×4): qty 1

## 2011-11-16 MED ORDER — FUROSEMIDE 10 MG/ML IJ SOLN
40.0000 mg | Freq: Once | INTRAMUSCULAR | Status: AC
  Administered 2011-11-16: 40 mg via INTRAVENOUS
  Filled 2011-11-16: qty 4

## 2011-11-16 MED ORDER — METHYLPREDNISOLONE SODIUM SUCC 40 MG IJ SOLR
20.0000 mg | Freq: Two times a day (BID) | INTRAMUSCULAR | Status: DC
  Administered 2011-11-16 – 2011-11-18 (×5): 20 mg via INTRAVENOUS
  Filled 2011-11-16 (×4): qty 0.5

## 2011-11-16 NOTE — Progress Notes (Signed)
Patient ID: [name], female   DOB: [birth year], 78 y.o.   MRN: [id number] Subjective:  Seems to have more of a bronchitic cough Got extra dose of lasix yesterday  Objective:  Vital Signs in the last 24 hours: BP 128/72  Pulse 104  Temp(Src) 98.4 F (36.9 C) (Axillary)  Resp 20  Ht 5\' 3"  (1.6 m)  Wt 52.164 kg (115 lb)  BMI 20.37 kg/m2  SpO2 97%  Physical Exam: Pleasant WF in NAD Lungs:  Rhonchi bilat Cardiac:  irgular rhythm, normal S1 and S2, no S3 Abdomen: Dressings present Extremities:  Left hand with some swelling, rings mildly tight.  Mild mottling of index finger.  Pulse present Pacer under left clavicle  Intake/Output from previous day: 05/28 0701 - 05/29 0700 In: 2105.8 [I.V.:1770.8; IV Piggyback:335] Out: 3330 [Urine:3150; Emesis/NG output:75; Drains:105] Weight Filed Weights   11/12/11 0915 11/13/11 0500 11/16/11 0600  Weight: 51.2 kg (112 lb 14 oz) 50.4 kg (111 lb 1.8 oz) 52.164 kg (115 lb)    Lab Results: Basic Metabolic Panel:  Basename 11/15/11 1320 11/15/11 0552  NA 140 141  K 3.8 3.6  CL 101 104  CO2 27 26  GLUCOSE 138* 132*  BUN 28* 30*  CREATININE 1.15* 1.23*    CBC:  Basename 11/16/11 0605 11/15/11 0552  WBC 18.4* 19.4*  NEUTROABS -- --  HGB 9.4* 9.5*  HCT 29.9* 30.4*  MCV 91.7 90.7  PLT 308 347    BNP    Component Value Date/Time   PROBNP >70000.0* 11/16/2011 0605    PROTIME: Lab Results  Component Value Date   INR 1.45 11/12/2011   INR 1.72* 11/12/2011   INR 1.99* 11/11/2011   PROTIME 16.5 11/28/2008    Telemetry: Episodes of PAF and pacing Sinus this am  Assessment/Plan:  1. Paroxysmal atrial fibrillation currently sinus p synch pacing  With bursts of PAF 2. Cardiomyopathy with EF 30-35% 3. Perm pacer  Rec:  Restart warfarin when able to take po meds. Continue IV amiodarone for time being. Rebolus Keep on dry side bid lasix with elevated BNP Restart ARB for low EF as Cr and BP more stable  Charlton Haws 8:18  AM 11/16/2011  Cardiology  11/16/2011, 8:18 AM

## 2011-11-16 NOTE — Progress Notes (Signed)
Name: [name] MRN: [id number] DOB: 01-Mar-1934    LOS: 5  Referring Provider:  EDP Reason for Referral:  shock  PULMONARY / CRITICAL CARE MEDICINE  BRIEF PROFILE:  79 yowf with steroid dep obstructive lung disease, CHF, CAF, divverticulitis adm by PCCM via [facility] am 5/24 with increased abd pain and hypotension. CT abd c/w free air and divertular abscess. Ex-lap 5/24 PM revealed perforated sigmoid diverticulitis. Underwent sigmoid colon resection and abscess drainage (Dr Biagio Quint). Remained intubated post op  SUBJECTIVE:  No distress, some int destauration  Vital Signs: Temp:  [97.7 F (36.5 C)-98.7 F (37.1 C)] 98.3 F (36.8 C) (05/29 0800) Pulse Rate:  [97-122] 104  (05/29 0554) Resp:  [20-29] 20  (05/29 0801) BP: (128-139)/(67-85) 128/72 mmHg (05/29 0554) SpO2:  [93 %-100 %] 93 % (05/29 0915) FiO2 (%):  [28 %] 28 % (05/28 2000) Weight:  [52.164 kg (115 lb)] 52.164 kg (115 lb) (05/29 0600) 4 liters  Physical Examination: HEENT: WNL, jvd Neck: no ett, JVD Chest: scattered rhonchi diffuse Heart: s1s2 no murmur Abd: soft, diminished BS, diffusely mildly tender, tender diffsue Ext: warm, no edema Neuro: no focal deficits  Active Problems:  HYPOTHYROIDISM  Atrial fibrillation  HEART FAILURE  History of hypertension  Respiratory failure following trauma and surgery  Chronic obstructive asthma  Perforated diverticulitis  Peritonitis  Hypotension   ASSESSMENT AND PLAN  PULMONARY  COPD/fixed asthma, (Xolair, Singulair, Pred (7.5) are chronic meds). Reporting increased WOB on 5/28. Suspect post-op atx and decreased abd compliance from ileus are significant contributors. May also consider edema as contributing factor.  plan - Scheduled nebulized BDs and steroids - changed to low dose solumedrol 5/27, reduce slight, unimpressed bronchospasm and will limit wound healing -likely increased edema on pcxr, bnp reviewed, lasix -add flutter Valve, alternate IS - Additional  dose of Lasix 5/28, repeat this to same goal neg neg balance -pcxr in am  -upright position  CARDIOVASCULAR Cardiology consult 5/24   A: Afib w/ RVR, chronic systolic CHF (EF 40-98%) P:  -Per cards -Amiodarone started 5/24 -BNP noted, pcxr edema, lasi x again  RENAL  Lab 11/15/11 1320 11/15/11 0552 11/14/11 0500 11/13/11 0530 11/11/11 1035  NA 140 141 140 136 131*  K 3.8 3.6 -- -- --  CL 101 104 105 103 97  CO2 27 26 28 26 23   BUN 28* 30* 24* 22 23  CREATININE 1.15* 1.23* 1.10 0.92 0.98  CALCIUM 9.2 8.6 7.6* 7.9* 9.2  MG -- -- -- -- --  PHOS -- -- -- -- --   Intake/Output      05/28 0701 - 05/29 0700 05/29 0701 - 05/30 0700   I.V. (mL/kg) 1770.8 (33.9)    IV Piggyback 335    Total Intake(mL/kg) 2105.8 (40.4)    Urine (mL/kg/hr) 3150 (2.5)    Emesis/NG output 75    Drains 105    Total Output 3330    Net -1224.2          Foley:  In ER 5/24  A:  AKI. ? Due to diuresis.  P:   -Monitor uop -dc foley when able to mobilize to pan, lasix today Chem in am   GASTROINTESTINAL  Lab 11/11/11 1035  AST 15  ALT 9  ALKPHOS 64  BILITOT 0.4  PROT 6.6  ALBUMIN 2.9*   Diet : NPO  A:  Post op ileus - Nutrition per CCS - Cont PCA as ordered for analgesia -avoid TPN   HEMATOLOGIC  Lab 11/16/11  1610 11/15/11 0552 11/14/11 0500 11/13/11 0530 11/12/11 0221 11/12/11 0025 11/11/11 2300 11/11/11 1035  HGB 9.4* 9.5* 7.9* 8.3* -- -- -- 11.9*  HCT 29.9* 30.4* 24.7* 25.5* -- -- -- 36.8  PLT 308 347 226 224 -- -- -- 262  INR -- -- -- -- 1.45 1.72* 1.99* 3.29*  APTT -- -- -- -- -- -- -- --   A:  Coagulopathic from coumadin Resume warfarin for Chronic AF when safe to do so per CCS -heparin will discuss with surgery  INFECTIOUS  Lab 11/16/11 0605 11/15/11 0552 11/14/11 0500 11/13/11 0530 11/11/11 1035  WBC 18.4* 19.4* 14.5* 17.2* 13.5*  PROCALCITON 1.73 -- -- -- 0.94   Cultures: UC 5/24 >>15K mult orgs.  BC x2 5/24 >>  Antibiotics: Vancomycin  5/24 only Zosyn 5/24  >>  A:  Diverticular abscess due to perforated divertic/ low grade sepsis  P:  Zosyn day 4/x, continue -if spike, add diflucan, vanc  ENDOCRINE  A:  Hypothyroidism Plan: -synthroid  NEUROLOGIC  A:  Dementia dx pre op>>normal mental status 5/27, now reporting hallucinations at HS.   P:  -Minimize sedation/ hypnotics -insomnia an issue, no oral access, low dose versed x 1  BEST PRACTICE / DISPOSITION - Level of Care: SDU - Primary Service:  PCCM - Consultants:  CCS/ gen surgery - Code Status:  Full - Diet:  See GI - DVT Px: PAS - GI Px:  PPI  Miranda Rossetti. [name], MD, FACP Pgr: [phone] [facility]

## 2011-11-16 NOTE — Progress Notes (Signed)
General Surgery Note  LOS: 5 days  POD# 4 Room - 1223  Assessment/Plan: 1.  EXPLORATORY LAPAROTOMY, COLON RESECTION SIGMOID INCISION AND DRAINAGE ABSCESS for perforated diverticulitis - 11/12/2011 - Dr. Trude Mcburney  On Zosyn.  WBC down a little to 18,400.  Tolerated NGT out, though maybe a little more distended.  Continue NPO until ostomy functions.  Drain RLQ.  I will leave drain until colostomy functioning.  Progressing from a surgery standpoint, but she has a lot of medical problems.  2.  HYPOTHYROIDISM    3. Atrial fibrillation - seen by Dr. Burna Forts  On amiodarone. 4.  History of HEART FAILURE   BNP > 70,000 - 11/16/2011 Procalcitonin - 1.73 (11/16/2011)  5.  History of hypertension  6.  Chronic obstructive asthma.  She seems to be breathing a little easier today.  7.  DVT proph - on Lovenox 8.  On isolation for MRSA. 9.  Okay for foley to come out from my standpoint.  Will leave to CCM.  Subjective:  Looks a little better to me today.  Still feels hot.  Somewhat short of breath. Nothing in ostomy. Objective:   Filed Vitals:   11/16/11 0554  BP: 128/72  Pulse: 104  Temp:   Resp: 25     Intake/Output from previous day:  05/28 0701 - 05/29 0700 In: 2105.8 [I.V.:1770.8; IV Piggyback:335] Out: 3330 [Urine:3150; Emesis/NG output:75; Drains:105]  Intake/Output this shift:      Physical Exam:   General: Older WF who is alert and oriented.    HEENT: Normal. Pupils equal. .   Lungs: Some rhonchi.   Abdomen: Soft, but distended.  Rare BS.  Drain RLQ.   Wound: Clean.  Ostomy okay. Nothing out   Neurologic:  Grossly intact to motor and sensory function.   Psychiatric: Has normal mood and affect.    Lab Results:     [facility] 11/16/11 0605 11/15/11 0552  WBC 18.4* 19.4*  HGB 9.4* 9.5*  HCT 29.9* 30.4*  PLT 308 347    BMET    Basename 11/15/11 1320 11/15/11 0552  NA 140 141  K 3.8 3.6  CL 101 104  CO2 27 26  GLUCOSE 138* 132*  BUN 28* 30*  CREATININE 1.15*  1.23*  CALCIUM 9.2 8.6    PT/INR  No results found for this basename: LABPROT:2,INR:2 in the last 72 hours  ABG  No results found for this basename: PHART:2,PCO2:2,PO2:2,HCO3:2 in the last 72 hours   Studies/Results:  Dg Chest 1 View  11/15/2011  *RADIOLOGY REPORT*  Clinical Data: Dyspnea  CHEST - 1 VIEW  Comparison: 11/12/2011, 01/07/2011, and 09/21/2007  Findings: Endotracheal tube and nasogastric tube have been removed. Right IJ central venous catheter terminates in the superior vena cava.  Left subclavian dual lead pacer is stable.  Cardiac leads project over the chest. Stable heart and mediastinal contours. Cardiac lead partially obscures the left upper lung calcified granuloma, unchanged dating back to chest radiograph of 2009.  There is suggestion of focal airspace disease in the peripheral aspect of the left upper lung field.  This region is partially obscured by the cardiac lead.  The lung volumes are low and there is mild bibasilar atelectasis.  Left costophrenic angle is blunted, for which a small pleural effusion cannot be excluded.  Left axillary surgical clips noted.  Degenerative changes and mild scoliosis of the thoracic spine.  There are remote healed right rib fractures.  There is callus formation associated with the lateral arc of  the left sixth rib, which was not definitely present on chest radiographs of 2012 or 2009.  This could be a subacute fracture.  IMPRESSION:  1.  New focal airspace opacity in the left upper lung field, peripherally.  This is new compared to 11/12/2011.  Cannot exclude pneumonia or pulmonary infarction. 2.  Low lung volumes with bibasilar atelectasis and small left pleural effusion. 3.  Subacute versus remote left sixth rib fracture and remote right rib fractures.  Original Report Authenticated By: Britta Mccreedy, M.D.     Anti-infectives:   Anti-infectives     Start     Dose/Rate Route Frequency Ordered Stop   11/11/11 2000   piperacillin-tazobactam  (ZOSYN) IVPB 3.375 g        3.375 g 12.5 mL/hr over 240 Minutes Intravenous Every 8 hours 11/11/11 1928     11/11/11 1130   piperacillin-tazobactam (ZOSYN) IVPB 3.375 g        3.375 g 100 mL/hr over 30 Minutes Intravenous  Once 11/11/11 1050 11/11/11 1200   11/11/11 1130   vancomycin (VANCOCIN) IVPB 1000 mg/200 mL premix        1,000 mg 200 mL/hr over 60 Minutes Intravenous  Once 11/11/11 1050 11/11/11 1341          [name], MD, FACS Pager: [phone],   [facility] Office: [phone] 11/16/2011

## 2011-11-16 NOTE — Consult Note (Signed)
WOC ostomy consult  Stoma type/location: LLQ Colostomy Stomal assessment/size: 1 and 5/8 inch round, budded stoma with os at dead center Peristomal assessment: intact, clear Treatment options for stomal/peristomal skin: none indicated Output none at this time  Ostomy pouching: 1pc convex pouch applied today.  Education provided: Patient and spouse visualized stoma, noted edema and that stoma size would continue to decrease.  Illustrated with back of old pouch that pouch wear time could have been extended, but that this change was needed to measure and assess stoma.  Routine pouch changes will be twice weekly.  I may try a 2-piece system in subsequent changes, but for now, the 1pc with convexity is sufficient. I will follow with you. Thanks, Ladona Mow, MSN, RN, [facility], CWOCN [phone])

## 2011-11-17 LAB — DIFFERENTIAL
Basophils Relative: 0 % (ref 0–1)
Eosinophils Absolute: 0 10*3/uL (ref 0.0–0.7)
Eosinophils Relative: 0 % (ref 0–5)
Lymphocytes Relative: 2 % — ABNORMAL LOW (ref 12–46)
Neutro Abs: 16.2 10*3/uL — ABNORMAL HIGH (ref 1.7–7.7)
Neutrophils Relative %: 95 % — ABNORMAL HIGH (ref 43–77)

## 2011-11-17 LAB — CBC
HCT: 30.1 % — ABNORMAL LOW (ref 36.0–46.0)
Hemoglobin: 9.7 g/dL — ABNORMAL LOW (ref 12.0–15.0)
MCH: 28.6 pg (ref 26.0–34.0)
MCHC: 32.2 g/dL (ref 30.0–36.0)
RBC: 3.39 MIL/uL — ABNORMAL LOW (ref 3.87–5.11)

## 2011-11-17 LAB — CULTURE, BLOOD (ROUTINE X 2): Culture  Setup Time: 201305241354

## 2011-11-17 MED ORDER — POTASSIUM CHLORIDE 10 MEQ/100ML IV SOLN
10.0000 meq | INTRAVENOUS | Status: AC
  Administered 2011-11-17 (×2): 10 meq via INTRAVENOUS
  Filled 2011-11-17: qty 200

## 2011-11-17 MED ORDER — BUDESONIDE 0.25 MG/2ML IN SUSP
0.2500 mg | Freq: Four times a day (QID) | RESPIRATORY_TRACT | Status: DC
  Administered 2011-11-18 – 2011-11-19 (×6): 0.25 mg via RESPIRATORY_TRACT
  Filled 2011-11-17 (×10): qty 2

## 2011-11-17 MED ORDER — FUROSEMIDE 10 MG/ML IJ SOLN
40.0000 mg | Freq: Two times a day (BID) | INTRAMUSCULAR | Status: DC
  Administered 2011-11-17 – 2011-11-18 (×2): 40 mg via INTRAVENOUS
  Filled 2011-11-17 (×4): qty 4

## 2011-11-17 MED ORDER — LEVALBUTEROL HCL 0.63 MG/3ML IN NEBU
0.6300 mg | INHALATION_SOLUTION | Freq: Four times a day (QID) | RESPIRATORY_TRACT | Status: DC
  Administered 2011-11-18 – 2011-11-19 (×6): 0.63 mg via RESPIRATORY_TRACT
  Filled 2011-11-17 (×14): qty 3

## 2011-11-17 NOTE — Progress Notes (Signed)
Patient ID: [name], female   DOB: Dec 22, 1933, 78 y.o.   MRN: [id number] Subjective:  Seen early this am.  Coughing less.    Objective:  Vital Signs in the last 24 hours: BP 126/72  Pulse 111  Temp(Src) 97.8 F (36.6 C) (Oral)  Resp 14  Ht 5\' 3"  (1.6 m)  Wt 52.1 kg (114 lb 13.8 oz)  BMI 20.35 kg/m2  SpO2 98%  Physical Exam: Pleasant WF in NAD Lungs:  Rhonchi bilat Cardiac:  irgular rhythm, normal S1 and S2, no S3 Abdomen: Dressings present Extremities:  Left hand with some swelling, rings mildly tight.  Mild mottling of index finger.  Pulse present Pacer under left clavicle  Intake/Output from previous day: 05/29 0701 - 05/30 0700 In: 1848.7 [I.V.:1659.7; IV Piggyback:189] Out: 1610 [Urine:1560; Drains:50] Weight Filed Weights   11/13/11 0500 11/16/11 0600 11/17/11 0400  Weight: 50.4 kg (111 lb 1.8 oz) 52.164 kg (115 lb) 52.1 kg (114 lb 13.8 oz)    Lab Results: Basic Metabolic Panel:  Basename 11/15/11 1320 11/15/11 0552  NA 140 141  K 3.8 3.6  CL 101 104  CO2 27 26  GLUCOSE 138* 132*  BUN 28* 30*  CREATININE 1.15* 1.23*    CBC:  Basename 11/17/11 1400 11/16/11 0605  WBC 17.0* 18.4*  NEUTROABS 16.2* --  HGB 9.7* 9.4*  HCT 30.1* 29.9*  MCV 88.8 91.7  PLT 330 308    BNP    Component Value Date/Time   PROBNP >70000.0* 11/16/2011 0605    PROTIME: Lab Results  Component Value Date   INR 1.45 11/12/2011   INR 1.72* 11/12/2011   INR 1.99* 11/11/2011   PROTIME 16.5 11/28/2008    Telemetry: Episodes of PAF and pacing Sinus this am  Assessment/Plan:  1. Paroxysmal atrial fibrillation currently sinus p synch pacing  With bursts of PAF 2. Cardiomyopathy with EF 30-35% 3. Perm pacer  Rec:  Restart warfarin when able to take po meds. Continue IV amiodarone for time being. Rebolus Keep on dry side bid lasix with elevated BNP Weight stable I/O's even Cozaar started yesterday 25mg  and BP and Cr stable to date  Charlton Haws 4:44  PM 11/17/2011  Cardiology  11/17/2011, 4:44 PM

## 2011-11-17 NOTE — Progress Notes (Signed)
CARE MANAGEMENT NOTE 11/17/2011  Patient:  [name],[name]   Account Number:  [id number]  Date Initiated:  11/14/2011  Documentation initiated by:  Vihan Santagata  Subjective/Objective Assessment:   Patient with perforated diverticlum required open lap post op resp failure and a.fib requiring iv intervention     Action/Plan:   lives at home/new ostomy pouch. being seen by the [facility] RN, may need hhc   Anticipated DC Date:  11/20/2011   Anticipated DC Plan:  HOME/SELF CARE  In-house referral  NA      DC Planning Services  NA      [facility] Choice  NA   Choice offered to / List presented to:  NA   DME arranged  NA      DME agency  NA     HH arranged  NA      HH agency  NA   Status of service:  In process, will continue to follow Medicare Important Message given?  NA - LOS <3 / Initial given by admissions (If response is "NO", the following Medicare IM given date fields will be blank) Date Medicare IM given:   Date Additional Medicare IM given:    Discharge Disposition:    Per UR Regulation:  Reviewed for med. necessity/level of care/duration of stay  If discussed at Long Length of Stay Meetings, dates discussed:    Comments:  [id number]/Nisa Decaire [name], RN, BSN, CCM No discharge needs present at time of this review at the sdu/icu level. patient has new colostmy may need snf at [facility] for the intial home phase.  Patient does also use [facility] for her husband who recently has had and IVAD placement done.  Children and family are also very involoved in parents care. Case Management [phone]   [id number] [name], RN,BSN,CCM No discharge needs present at time of this review Case Management [phone]

## 2011-11-17 NOTE — Progress Notes (Signed)
Name: Tatyanna Cronk Luft MRN: [id number] DOB: Apr 05, 1934    LOS: 6  Referring Provider:  EDP Reason for Referral:  shock  PULMONARY / CRITICAL CARE MEDICINE  BRIEF PROFILE:  56 yowf with steroid dep obstructive lung disease, CHF, CAF, divverticulitis adm by PCCM via [facility] am 5/24 with increased abd pain and hypotension. CT abd c/w free air and divertular abscess. Ex-lap 5/24 PM revealed perforated sigmoid diverticulitis. Underwent sigmoid colon resection and abscess drainage (Dr Biagio Quint). Remained intubated post op  SUBJECTIVE:  In chair looks stronger  Vital Signs: Temp:  [97.4 F (36.3 C)-98.1 F (36.7 C)] 97.8 F (36.6 C) (05/30 0816) Pulse Rate:  [38-91] 83  (05/30 0800) Resp:  [16-91] 29  (05/30 1018) BP: (119-132)/(66-92) 132/82 mmHg (05/30 0800) SpO2:  [93 %-100 %] 95 % (05/30 1018) Weight:  [52.1 kg (114 lb 13.8 oz)] 52.1 kg (114 lb 13.8 oz) (05/30 0400) 4 liters  Physical Examination: HEENT: WNL, jvd same Neck: JVD same Chest: crackles unchanged Heart: s1s2 no murmur Abd: soft, diminished BS, diffusely mildly tender no changes Ext: warm, no edema Neuro: no focal deficits  Active Problems:  HYPOTHYROIDISM  Atrial fibrillation  HEART FAILURE  History of hypertension  Respiratory failure following trauma and surgery  Chronic obstructive asthma  Perforated diverticulitis  Peritonitis  Hypotension   ASSESSMENT AND PLAN  PULMONARY  COPD/fixed asthma, (Xolair, Singulair, Pred (7.5) are chronic meds). Reporting increased WOB on 5/28. Suspect post-op atx and decreased abd compliance from ileus are significant contributors. May also consider edema as contributing factor.  plan - Scheduled nebulized BDs and steroids low dose - changed steroids to daily in am  -likely increased edema on pcxr, bnp reviewed, lasix agree to bid, cards note reviewed -add flutter Valve, alternate IS - established bid lasix ordered -pcxr in am  -upright position, looks goo din a  chair  CARDIOVASCULAR Cardiology consult 5/24   A: Afib w/ RVR, chronic systolic CHF (EF 21-30%) P:  -Per cards -Amiodarone started 5/24 -BNP noted, lasix bid, goal neg 500 cc  RENAL  Lab 11/15/11 1320 11/15/11 0552 11/14/11 0500 11/13/11 0530 11/11/11 1035  NA 140 141 140 136 131*  K 3.8 3.6 -- -- --  CL 101 104 105 103 97  CO2 27 26 28 26 23   BUN 28* 30* 24* 22 23  CREATININE 1.15* 1.23* 1.10 0.92 0.98  CALCIUM 9.2 8.6 7.6* 7.9* 9.2  MG -- -- -- -- --  PHOS -- -- -- -- --   Intake/Output      05/29 0701 - 05/30 0700 05/30 0701 - 05/31 0700   I.V. (mL/kg) 1659.7 (31.9) 264.1 (5.1)   IV Piggyback 189    Total Intake(mL/kg) 1848.7 (35.5) 264.1 (5.1)   Urine (mL/kg/hr) 1560 (1.2) 30 (0.1)   Emesis/NG output     Drains 50 45   Total Output 1610 75   Net +238.7 +189.1         Foley:  In ER 5/24  A:  AKI / ATN P:   -lasix Chem in am  k supp  GASTROINTESTINAL  Lab 11/11/11 1035  AST 15  ALT 9  ALKPHOS 64  BILITOT 0.4  PROT 6.6  ALBUMIN 2.9*   Diet : NPO  A:  Post op ileus - Nutrition per CCS - Cont PCA as ordered for analgesia -avoid TPN if able   HEMATOLOGIC  Lab 11/16/11 0605 11/15/11 0552 11/14/11 0500 11/13/11 0530 11/12/11 0221 11/12/11 0025 11/11/11 2300 11/11/11 1035  HGB  9.4* 9.5* 7.9* 8.3* -- -- -- 11.9*  HCT 29.9* 30.4* 24.7* 25.5* -- -- -- 36.8  PLT 308 347 226 224 -- -- -- 262  INR -- -- -- -- 1.45 1.72* 1.99* 3.29*  APTT -- -- -- -- -- -- -- --   A:  Coagulopathic from coumadin Resume warfarin when orals tolefrated  INFECTIOUS  Lab 11/16/11 0605 11/15/11 0552 11/14/11 0500 11/13/11 0530 11/11/11 1035  WBC 18.4* 19.4* 14.5* 17.2* 13.5*  PROCALCITON 1.73 -- -- -- 0.94   Cultures: UC 5/24 >>15K mult orgs.  BC x2 5/24 >>  Antibiotics: Vancomycin  5/24 only Zosyn 5/24 >>  A:  Diverticular abscess due to perforated divertic/ low grade sepsis  P:  Zosyn day 4/x, continue, will establish stop date -if spike, add diflucan, vanc,  not needed as of now  ENDOCRINE  A:  Hypothyroidism Plan: -synthroid  NEUROLOGIC  A:  Dementia dx pre op>>normal mental status 5/27, now reporting hallucinations at HS.   P:  -Minimize sedation/ hypnotics -insomnia an issue, no oral access, low dose versed prn  BEST PRACTICE / DISPOSITION - Level of Care: SDU - Primary Service:  PCCM - Consultants:  CCS/ gen surgery - Code Status:  Full - Diet:  See GI - DVT Px: PAS - GI Px:  PPI  Mcarthur Rossetti. [name], MD, FACP Pgr: [phone] [place] [facility]

## 2011-11-17 NOTE — Progress Notes (Signed)
5 Days Post-Op  Subjective: Up in chair, pretty good spirits, says her asthma has been bad.  Objective: Vital signs in last 24 hours: Temp:  [97.4 F (36.3 C)-98.1 F (36.7 C)] 97.8 F (36.6 C) (05/30 0816) Pulse Rate:  [38-91] 83  (05/30 0800) Resp:  [16-91] 29  (05/30 1018) BP: (119-132)/(66-92) 132/82 mmHg (05/30 0800) SpO2:  [93 %-100 %] 95 % (05/30 1018) Weight:  [52.1 kg (114 lb 13.8 oz)] 52.1 kg (114 lb 13.8 oz) (05/30 0400) Last BM Date: 11/11/11  VSS, BNP 70K WBC 18K  Intake/Output from previous day: 05/29 0701 - 05/30 0700 In: 1848.7 [I.V.:1659.7; IV Piggyback:189] Out: 1610 [Urine:1560; Drains:50] Intake/Output this shift: Total I/O In: 264.1 [I.V.:264.1] Out: 75 [Urine:30; Drains:45]  General appearance: alert, cooperative and no distress Resp: rales in bases, not wheezing now GI: soft, tender nothing in ostomy bag, no bowel sounds.  Lab Results:   [facility] 11/16/11 0605 11/15/11 0552  WBC 18.4* 19.4*  HGB 9.4* 9.5*  HCT 29.9* 30.4*  PLT 308 347    BMET  Basename 11/15/11 1320 11/15/11 0552  NA 140 141  K 3.8 3.6  CL 101 104  CO2 27 26  GLUCOSE 138* 132*  BUN 28* 30*  CREATININE 1.15* 1.23*  CALCIUM 9.2 8.6   PT/INR No results found for this basename: LABPROT:2,INR:2 in the last 72 hours   Lab 11/11/11 1035  AST 15  ALT 9  ALKPHOS 64  BILITOT 0.4  PROT 6.6  ALBUMIN 2.9*     Lipase  No results found for this basename: lipase     Studies/Results: Dg Chest Port 1 View  11/16/2011  *RADIOLOGY REPORT*  Clinical Data: Evaluate endotracheal tube, pulmonary edema  PORTABLE CHEST - 1 VIEW  Comparison: 11/15/2011; 11/12/2011; 01/12/2011  Findings: Grossly unchanged cardiac silhouette and mediastinal contours.  Stable position of support apparatus.  No definite pneumothorax, though evaluation is somewhat obscured secondary to overlying chin.  Lung volumes remain persistently reduced.  There is mild elevation of the right hemidiaphragm.   Blunting of the bilateral costophrenic angles may suggest small bilateral effusions.  Grossly unchanged peripheral heterogeneous air space opacity within the left upper lung.  Grossly unchanged bones.  Left breast surgical clips.  IMPRESSION: 1.  Stable positioning of support apparatus.  No pneumothorax. 2.  Grossly unchanged peripheral heterogeneous air space opacity within the left upper lung worrisome for infection. Continued attention on follow-up is recommended. 3.  Persistently reduced lung volumes with small bilateral effusions and basilar opacities.  Original Report Authenticated By: Waynard Reeds, M.D.    Medications:    . antiseptic oral rinse  15 mL Mouth Rinse QID  . budesonide  0.25 mg Nebulization Q6H  . chlorhexidine  15 mL Mouth Rinse BID  . DULoxetine  30 mg Oral BID  . enoxaparin (LOVENOX) injection  40 mg Subcutaneous Q24H  . HYDROmorphone PCA 0.3 mg/mL   Intravenous Q4H  . ipratropium  0.5 mg Nebulization Q6H  . levalbuterol  0.63 mg Nebulization Q6H  . levothyroxine  38 mcg Intravenous Daily  . losartan  25 mg Oral Daily  . methylPREDNISolone (SOLU-MEDROL) injection  20 mg Intravenous Q12H  . mupirocin ointment  1 application Nasal BID  . pantoprazole (PROTONIX) IV  40 mg Intravenous Q24H  . piperacillin-tazobactam (ZOSYN)  IV  3.375 g Intravenous Q8H  . traZODone  100 mg Per Tube QHS  . zolpidem  5 mg Per Tube QHS    Assessment/Plan EXPLORATORY LAPAROTOMY, COLON  RESECTION SIGMOID INCISION AND DRAINAGE ABSCESS for perforated diverticulitis - 11/12/2011 - Dr. Trude Mcburney  WBC 18,400 - 11/07/2011 HYPOTHYROIDISM Atrial fibrillation - seen by Dr. Burna Forts   On amiodarone, Burst of Paf, ptvp History of HEART FAILURE   Cardiomyopathy, EF 30-35%,  BNP 70,000 (11/16/2011)  History of hypertension   Chronic obstructive asthma.  Her lungs seem to be her primary problem.    DVT proph - on Lovenox  Still has Foley.  eleveated WBC on Zosyn and solumedrol  Plan:   Continue NPO, stabilize she is gettting dressing changes BID. Open areas look good.    LOS: 6 days    [name],[name] 11/17/2011  Overall looks better.  Still no ostomy output, though she has bowel sounds.  She is in the chair now.  Her husband is in the room.  Await ostomy function.  [name], MD, [facility] Pager: [phone] Office phone:  [phone]

## 2011-11-18 ENCOUNTER — Inpatient Hospital Stay (HOSPITAL_COMMUNITY): Payer: Medicare Other

## 2011-11-18 LAB — CULTURE, BLOOD (ROUTINE X 2)
Culture  Setup Time: 201305250009
Culture: NO GROWTH

## 2011-11-18 LAB — BASIC METABOLIC PANEL
CO2: 29 mEq/L (ref 19–32)
Chloride: 95 mEq/L — ABNORMAL LOW (ref 96–112)
Glucose, Bld: 115 mg/dL — ABNORMAL HIGH (ref 70–99)
Potassium: 4.1 mEq/L (ref 3.5–5.1)
Sodium: 134 mEq/L — ABNORMAL LOW (ref 135–145)

## 2011-11-18 LAB — PHOSPHORUS: Phosphorus: 3.8 mg/dL (ref 2.3–4.6)

## 2011-11-18 LAB — MAGNESIUM: Magnesium: 1.6 mg/dL (ref 1.5–2.5)

## 2011-11-18 MED ORDER — FUROSEMIDE 10 MG/ML IJ SOLN
20.0000 mg | Freq: Two times a day (BID) | INTRAMUSCULAR | Status: AC
  Administered 2011-11-18: 20 mg via INTRAVENOUS
  Filled 2011-11-18 (×2): qty 2

## 2011-11-18 MED ORDER — AMIODARONE HCL 200 MG PO TABS
200.0000 mg | ORAL_TABLET | Freq: Two times a day (BID) | ORAL | Status: DC
  Administered 2011-11-18 – 2011-11-20 (×4): 200 mg via ORAL
  Filled 2011-11-18 (×12): qty 1

## 2011-11-18 MED ORDER — HEPARIN SODIUM (PORCINE) 5000 UNIT/ML IJ SOLN
5000.0000 [IU] | Freq: Three times a day (TID) | INTRAMUSCULAR | Status: DC
  Administered 2011-11-19 – 2011-12-02 (×39): 5000 [IU] via SUBCUTANEOUS
  Filled 2011-11-18 (×42): qty 1

## 2011-11-18 MED ORDER — METHYLPREDNISOLONE SODIUM SUCC 40 MG IJ SOLR
20.0000 mg | Freq: Every day | INTRAMUSCULAR | Status: DC
  Filled 2011-11-18 (×2): qty 0.5

## 2011-11-18 NOTE — Clinical Social Work Psychosocial (Signed)
Clinical Social Work Department BRIEF PSYCHOSOCIAL ASSESSMENT 11/18/2011  Patient:  [name],[name]     Account Number:  [id number]     Admit date:  11/11/2011  Clinical Social Worker:  Jodelle Red  Date/Time:  11/17/2011 11:00 AM  Referred by:  CSW  Date Referred:  11/17/2011 Referred for  Other - See comment   Other Referral:   Interview type:  Patient Other interview type:   HUSBAND    PSYCHOSOCIAL DATA Living Status:  HUSBAND Admitted from facility:  [facility] Level of care:  Independent Living Primary support name:  [name] Primary support relationship to patient:  SPOUSE Degree of support available:   VERY GOOD FROM HUSBAND AND DAUGHTER    CURRENT CONCERNS Current Concerns  Adjustment to Illness   Other Concerns:   POSSIBLE SNF PLACEMENT    SOCIAL WORK ASSESSMENT / PLAN CSW MET WITH BOTH PT AND HER HUSBAND TO CHECK IN AND ASSESS NEEDS DUE TO HOSPITALIZATION. PT AND HUSBAND LIVE AT [facility] IN INDEPENDENT LIVING. CSW WAS CONTACTED BY CSW AT [facility] WHO REPORTS THEY CAN ACCOMODATE PT AT THEIR SNF IF NEEDED. CSW EXPLAINED THAT OPTION TO PT AND HUSBAND AND THEY ARE CONSIDERING THEIR OPTIONS. HUSBAND RECENTLY HAD SURGERY AS WELL AND FEELS SNF MAY BE A GOOD OPTION. THEY PLAN TO DISCUSS AND CSW WILL REASSESS.   Assessment/plan status:  Psychosocial Support/Ongoing Assessment of Needs Other assessment/ plan:   SNF IF NEEDED   Information/referral to community resources:    PATIENT'S/FAMILY'S RESPONSE TO PLAN OF CARE: PT WAS HAVING A GREAT DAY AND FEELING WELL EMOTIONALLY. THEY WERE VERY GLAD TO HEAR OF POSSIBLE D/C OPTIONS. THEY AGREE FOR CSW TO FOLLOW AND CHECK BACK IN AFTER PT HAS SEEN PT.    Vennie Homans, [place] 11/18/2011 8:46 AM [phone]

## 2011-11-18 NOTE — Progress Notes (Signed)
General Surgery Note  LOS: 7 days  POD# 6 Room - 1223  Assessment/Plan: 1.  EXPLORATORY LAPAROTOMY, COLON RESECTION SIGMOID INCISION AND DRAINAGE ABSCESS for perforated diverticulitis - 11/12/2011 - Dr. Trude Mcburney  On Zosyn.  WBC down a little to 17,000 - 11/18/2011.  Drain RLQ.  I will leave drain until colostomy functioning.   2.  HYPOTHYROIDISM    3. Atrial fibrillation - seen by Dr. Burna Forts  On amiodarone.  Suggested keeping on dry side with Lasix.  The patient thinks the bed weights are inaccurate. 4.  History of HEART FAILURE    5.  History of hypertension  6.  Chronic obstructive asthma.  She seems to be breathing a little easier today.  7.  DVT proph - on Lovenox 8.  On isolation for MRSA. 9.  Foley out today. 10.  Deconditioned.  PT working with patient.  Subjective:  Continues to look a little better every day.  But very weak.  Able to sit in chair for a while. Nothing in ostomy. Objective:   Filed Vitals:   11/18/11 0727  BP:   Pulse:   Temp:   Resp: 10     Intake/Output from previous day:  05/30 0701 - 05/31 0700 In: 1989.3 [I.V.:1725.3; IV Piggyback:264] Out: 2490 [Urine:2300; Drains:190]  Intake/Output this shift:      Physical Exam:   General: Older WF who is alert and oriented. In good spirits.   HEENT: Normal. Pupils equal. .   Lungs: Some rhonchi.   Abdomen: Mild distended.  BS present.   Drain RLQ.   Wound: Clean.  Ostomy okay. Nothing out   Neurologic:  Grossly intact to motor and sensory function.   Psychiatric: Has normal mood and affect.    Lab Results:     Basename 11/17/11 1400 11/16/11 0605  WBC 17.0* 18.4*  HGB 9.7* 9.4*  HCT 30.1* 29.9*  PLT 330 308    BMET    Basename 11/18/11 0427 11/15/11 1320  NA 134* 140  K 4.1 3.8  CL 95* 101  CO2 29 27  GLUCOSE 115* 138*  BUN 37* 28*  CREATININE 1.40* 1.15*  CALCIUM 9.1 9.2    PT/INR  No results found for this basename: LABPROT:2,INR:2 in the last 72 hours  ABG  No results  found for this basename: PHART:2,PCO2:2,PO2:2,HCO3:2 in the last 72 hours   Studies/Results:  No results found.   Anti-infectives:   Anti-infectives     Start     Dose/Rate Route Frequency Ordered Stop   11/11/11 2000   piperacillin-tazobactam (ZOSYN) IVPB 3.375 g        3.375 g 12.5 mL/hr over 240 Minutes Intravenous Every 8 hours 11/11/11 1928     11/11/11 1130   piperacillin-tazobactam (ZOSYN) IVPB 3.375 g        3.375 g 100 mL/hr over 30 Minutes Intravenous  Once 11/11/11 1050 11/11/11 1200   11/11/11 1130   vancomycin (VANCOCIN) IVPB 1000 mg/200 mL premix        1,000 mg 200 mL/hr over 60 Minutes Intravenous  Once 11/11/11 1050 11/11/11 1341          [name], MD, FACS Pager: [phone],   [facility] Office: [phone] 11/18/2011

## 2011-11-18 NOTE — Progress Notes (Signed)
Occupational Therapy Treatment Patient Details Name: [name] MRN: [id number] DOB: [birth year] Today's Date: 11/18/2011 Time: 4098-1191 OT Time Calculation (min): 12 min  OT Assessment / Plan / Recommendation Comments on Treatment Session needs reinforcement with theraband.  RUE to 90 due to central line.  difficulty with form for shoulder extension:  does tricpes which is still beneficial.    Follow Up Recommendations  Skilled nursing facility    Barriers to Discharge       Equipment Recommendations  Defer to next venue    Recommendations for Other Services    Frequency  2x   Plan      Precautions / Restrictions Precautions Precautions: Fall Restrictions Weight Bearing Restrictions: No       ADL       OT Diagnosis:    OT Problem List:   OT Treatment Interventions:     OT Goals Acute Rehab OT Goals Time For Goal Achievement: 12/02/11 Arm Goals Pt Will Complete Theraband Exer: to increase strength;Bilateral upper extremities;2 sets;5 reps;Other (comment) (orange theraband) Arm Goal: Theraband Exercises - Progress: Goal set today  Visit Information  Last OT Received On: 11/18/11 Assistance Needed: +2    Subjective Data      Prior Functioning       Cognition       Mobility     Exercises Shoulder Exercises Pendulum Exercise: Strengthening;5 reps;Both (R hand hurts:  loope made in orange band) Shoulder Flexion: Strengthening;Theraband;5 reps;Right;Left (RUE to 90 due to central line) Shoulder Extension: Strengthening;Theraband;5 reps;Right;Left (difficulty with this:  doing more triceps) Shoulder ABduction: Strengthening;5 reps;Theraband (horizontal abduction)  Balance    End of Session OT - End of Session Activity Tolerance: Patient tolerated treatment well Patient left: in chair;with call bell/phone within reach   [facility] 11/18/2011, 3:44 PM [name], OTR/L [phone] 11/18/2011

## 2011-11-18 NOTE — Progress Notes (Signed)
Name: Octa Uplinger Rachels MRN: [id number] DOB: 10-May-1934    LOS: 7  Referring Provider:  EDP Reason for Referral:  shock  PULMONARY / CRITICAL CARE MEDICINE  BRIEF PROFILE:  18 yowf with steroid dep obstructive lung disease, CHF, CAF, divverticulitis adm by PCCM via [facility] am 5/24 with increased abd pain and hypotension. CT abd c/w free air and divertular abscess. Ex-lap 5/24 PM revealed perforated sigmoid diverticulitis. Underwent sigmoid colon resection and abscess drainage (Dr Biagio Quint). Remained intubated post op  SUBJECTIVE:  In chair looks stronger  Vital Signs: Temp:  [97.4 F (36.3 C)-98.3 F (36.8 C)] 97.5 F (36.4 C) (05/31 1207) Pulse Rate:  [77-87] 85  (05/31 0941) Resp:  [10-20] 14  (05/31 0800) BP: (112-123)/(55-75) 122/65 mmHg (05/31 0800) SpO2:  [90 %-100 %] 100 % (05/31 0800) Weight:  [53.524 kg (118 lb)] 53.524 kg (118 lb) (05/31 0001) 4 liters  Physical Examination: HEENT: WNL, jvd less Neck: no upper airway sounds Chest: crackles reduced, basses Heart: s1s2 no murmur Abd: soft, diminished BS, tener unchanged, BS increased Ext: warm, no edema Neuro: no focal deficits  Active Problems:  HYPOTHYROIDISM  Atrial fibrillation  HEART FAILURE  History of hypertension  Respiratory failure following trauma and surgery  Chronic obstructive asthma  Perforated diverticulitis  Peritonitis  Hypotension   ASSESSMENT AND PLAN  PULMONARY  COPD/fixed asthma, (Xolair, Singulair, Pred (7.5) are chronic meds). Reporting increased WOB on 5/28. Suspect post-op atx and decreased abd compliance from ileus are significant contributors. May also consider edema as contributing factor.  Improved daily plan - Scheduled nebulized BDs and steroids low dose to solumedral 20 qday, remain as pred dependent -has improved with lasix, slight reduction see bun/ crt trend -pcxr reviewed -IS, upright  CARDIOVASCULAR Cardiology consult 5/24   A: Afib w/ RVR, chronic systolic CHF (EF  30-35%) P:  -Per cards -Amiodarone started 5/24, will d/w cards to give oral as taking pills now -lasix redcution  RENAL  Lab 11/18/11 0427 11/15/11 1320 11/15/11 0552 11/14/11 0500 11/13/11 0530  NA 134* 140 141 140 136  K 4.1 3.8 -- -- --  CL 95* 101 104 105 103  CO2 29 27 26 28 26   BUN 37* 28* 30* 24* 22  CREATININE 1.40* 1.15* 1.23* 1.10 0.92  CALCIUM 9.1 9.2 8.6 7.6* 7.9*  MG 1.6 -- -- -- --  PHOS 3.8 -- -- -- --   Intake/Output      05/30 0701 - 05/31 0700 05/31 0701 - 06/01 0700   I.V. (mL/kg) 1725.3 (32.2) 260.1 (4.9)   IV Piggyback 264    Total Intake(mL/kg) 1989.3 (37.2) 260.1 (4.9)   Urine (mL/kg/hr) 2300 (1.8) 100 (0.3)   Drains 190    Total Output 2490 100   Net -500.7 +160.1         Foley:  In ER 5/24  A:  AKI / ATN P:   -lasix reduction, see trend bun / crt  GASTROINTESTINAL No results found for this basename: AST:5,ALT:5,ALKPHOS:5,BILITOT:5,PROT:5,ALBUMIN:5 in the last 168 hours Diet : NPO  A:  Post op ileus - Nutrition per CCS - Cont PCA as ordered for analgesia -avoid TPN if able -ostomy no ouput  HEMATOLOGIC  Lab 11/17/11 1400 11/16/11 0605 11/15/11 0552 11/14/11 0500 11/13/11 0530 11/12/11 0221 11/12/11 0025 11/11/11 2300  HGB 9.7* 9.4* 9.5* 7.9* 8.3* -- -- --  HCT 30.1* 29.9* 30.4* 24.7* 25.5* -- -- --  PLT 330 308 347 226 224 -- -- --  INR -- -- -- -- --  1.45 1.72* 1.99*  APTT -- -- -- -- -- -- -- --   A:  Coagulopathic from coumadin Resume warfarin when orals,  With no ostomy output unsure if I would start coumadin now  INFECTIOUS  Lab 11/17/11 1400 11/16/11 0605 11/15/11 0552 11/14/11 0500 11/13/11 0530  WBC 17.0* 18.4* 19.4* 14.5* 17.2*  PROCALCITON -- 1.73 -- -- --   Cultures: UC 5/24 >>15K mult orgs.  BC x2 5/24 >>  Antibiotics: Vancomycin  5/24 only Zosyn 5/24 >>  A:  Diverticular abscess due to perforated divertic/ low grade sepsis  P:  Zosyn to add stop date June 2  ENDOCRINE  A:   Hypothyroidism Plan: -synthroid  NEUROLOGIC  A:  Dementia dx pre op>>normal mental status 5/27, now reporting hallucinations at HS.   P:  -Minimize sedation/ hypnotics -insomina, back to home [place] -pt active  BEST PRACTICE / DISPOSITION - Level of Care: SDU, consider to floor if to oral amio - Primary Service:  PCCM - Consultants:  CCS/ gen surgery - Code Status:  Full - Diet:  See GI - DVT Px: PAS - GI Px:  PPI  Mcarthur Rossetti. [name], MD, FACP Pgr: [phone] [facility]

## 2011-11-18 NOTE — Consult Note (Signed)
WOC ostomy follow up Stoma type/location: RLQ, colostomy Stomal assessment/size: 1 5/8" round, pink and moist- visualized thru pouch.  Output none, no flatus Ostomy pouching: 1pc convex placed 11/16/11, with no output decision made to not change pouch today.  Supplies ordered to bedside for nursing should she need them over the weekend Education provided: demonstration of lock and roll closure with return demo from pt today on opening the pouch to release gas should she start to have flatus.  She was not able to open spout easily, however I explained that she will be more successful with this process when she has some output in the pouch to empty.  She is motivated to learn, but has had several procedures this am and is SOB during my visit.   Noted her abdomen is distended, she reports this is better than it has been.  She does have some firmness at the top of the abdomen as well above her surgical site but she says this is not tender.   WOC will follow along with you Justyne Roell [name], CWOCN [phone]

## 2011-11-18 NOTE — Evaluation (Signed)
Occupational Therapy Evaluation Patient Details Name: [name] MRN: [id number] DOB: 18-Aug-1933 Today's Date: 11/18/2011 Time: 3086-5784 OT Time Calculation (min): 35 min  OT Assessment / Plan / Recommendation Clinical Impression  This 78 year old female with a history of hypothyroidism/diverticulitis was admitted and underwent exploratory lap with colon resection.  She was independent at home and was the caregiver for her husband, who has cardiac issues.  She now needs A x 2 for mobility and LB adls secondary to LE weakness/deconditioning.  She is appropriate for skilled OT to increase independence with min to mod A level goals in acute    OT Assessment  Patient needs continued OT Services    Follow Up Recommendations  Skilled nursing facility (depending upon progress/help at home)    Barriers to Discharge      Equipment Recommendations  Defer to next venue    Recommendations for Other Services    Frequency  Min 2X/week    Precautions / Restrictions Precautions Precautions: Fall Restrictions Weight Bearing Restrictions: No       ADL  Eating/Feeding: Simulated;Independent Where Assessed - Eating/Feeding: Chair Grooming: Simulated;Set up Where Assessed - Grooming: Unsupported sitting Upper Body Bathing: Simulated;Minimal assistance;Other (comment) (lines drains) Where Assessed - Upper Body Bathing: Unsupported sitting Lower Body Bathing: Simulated;+2 Total assistance Lower Body Bathing: Patient Percentage:  (25%) Where Assessed - Lower Body Bathing: Supported sit to stand Upper Body Dressing: Simulated;Minimal assistance;Other (comment) (lines) Where Assessed - Upper Body Dressing: Unsupported sitting Lower Body Dressing: Simulated;+2 Total assistance Lower Body Dressing: Patient Percentage: 10% Where Assessed - Lower Body Dressing: Sopported sit to stand Toilet Transfer: Simulated;+2 Total assistance Toilet Transfer: Patient Percentage: 60% Toilet Transfer Method:  Surveyor, minerals:  (bed to recliner) Toileting - Clothing Manipulation and Hygiene: Simulated;+2 Total assistance Toileting - Architect and Hygiene: Patient Percentage: 20% Where Assessed - Toileting Clothing Manipulation and Hygiene: Sit to stand from 3-in-1 or toilet Transfers/Ambulation Related to ADLs: knees buckle  used +2 without walker to guard    OT Diagnosis: Generalized weakness  OT Problem List: Decreased strength;Decreased activity tolerance;Decreased knowledge of use of DME or AE;Pain (decreased standing balance due to LE weakness) OT Treatment Interventions: Self-care/ADL training;Therapeutic activities;Patient/family education;Energy conservation;DME and/or AE instruction   OT Goals Acute Rehab OT Goals OT Goal Formulation: With patient Time For Goal Achievement: 12/02/11 Potential to Achieve Goals: Good ADL Goals Pt Will Perform Lower Body Bathing: with mod assist;Sit to stand from chair (with AE) ADL Goal: Lower Body Bathing - Progress: Goal set today Pt Will Perform Lower Body Dressing: with mod assist;Sit to stand from chair;with adaptive equipment ADL Goal: Lower Body Dressing - Progress: Goal set today Pt Will Transfer to Toilet: with min assist;Stand pivot transfer;3-in-1 ADL Goal: Toilet Transfer - Progress: Goal set today Pt Will Perform Toileting - Hygiene: with mod assist;Sit to stand from 3-in-1/toilet ADL Goal: Toileting - Hygiene - Progress: Goal set today Miscellaneous OT Goals Miscellaneous OT Goal #1: Pt will verbalize 3 energy conservation techniques OT Goal: Miscellaneous Goal #1 - Progress: Goal set today  Visit Information  Last OT Received On: 11/18/11 Assistance Needed: +2    Subjective Data  Subjective: "My legs give out" Patient Stated Goal: Be able to stand and walk better   Prior Functioning  Home Living Lives With: Spouse;Other (Comment) (she was caregiver:  he has heart issues/sx) Type of Home: House  (independent living house) Home Access: Level entry Home Layout: One level Bathroom Shower/Tub: Heritage manager  Toilet: Handicapped height Home Adaptive Equipment: Built-in shower seat Prior Function Level of Independence: Independent (they have meals provided:  can be delivered) Communication Communication: No difficulties    Cognition  Overall Cognitive Status: Appears within functional limits for tasks assessed/performed Arousal/Alertness: Awake/alert Orientation Level: Oriented X4 / Intact Behavior During Session: [facility] for tasks performed    Extremity/Trunk Assessment Right Upper Extremity Assessment RUE ROM/Strength/Tone: Within functional levels (strength 3+/5 bilaterally) Left Upper Extremity Assessment LUE ROM/Strength/Tone: Within functional levels   Mobility Bed Mobility Bed Mobility: Rolling Right Rolling Right: 4: Min assist Right Sidelying to Sit: 3: Mod assist Transfers Transfers: Sit to Stand Sit to Stand: 1: +2 Total assist Sit to Stand: Patient Percentage: 60% (knees buckled and sat down after a few seconds)   Exercise    Balance    End of Session OT - End of Session Activity Tolerance: Patient limited by fatigue Patient left: in chair;with call bell/phone within reach Nurse Communication: Mobility status   Merritt Mccravy 11/18/2011, 10:42 AM [name], OTR/L [phone] 11/18/2011

## 2011-11-18 NOTE — Evaluation (Addendum)
Physical Therapy Evaluation Patient Details Name: [name] MRN: [id number] DOB: 1933/11/30 Today's Date: 11/18/2011 Time: 4098-1191 PT Time Calculation (min): 27 min  PT Assessment / Plan / Recommendation Clinical Impression  Pt s/p ex lap and colon resection for perforated diverticulitis - 11/12/2011.  Pt would benefit from acute PT services in order to improve generalized weakness and increase independence with transfers and ambulation.  Pt reports her spouse just had heart surgery and she plans to go to SNF prior to return to independent living.    PT Assessment  Patient needs continued PT services    Follow Up Recommendations  Supervision/Assistance - 24 hour;Skilled nursing facility    Barriers to Discharge        lEquipment Recommendations  Defer to next venue    Recommendations for Other Services     Frequency Min 3X/week    Precautions / Restrictions Precautions Precautions: Fall Restrictions Weight Bearing Restrictions: No   Pertinent Vitals/Pain No pain at rest, Pt using PCA prior to and after therapy.      Mobility  Bed Mobility Bed Mobility: Rolling Right Rolling Right: 4: Min assist Right Sidelying to Sit: 3: Mod assist Transfers Transfers: Sit to Stand;Stand to Sit;Stand Pivot Transfers Sit to Stand: 1: +2 Total assist Sit to Stand: Patient Percentage: 50% Stand to Sit: 1: +2 Total assist Stand to Sit: Patient Percentage: 50% Stand Pivot Transfers: 1: +2 Total assist Stand Pivot Transfers: Patient Percentage: 70% Details for Transfer Assistance: verbal cues for safe technique, standing attempted x2 due to buckling knees, pt reports her legs usually buckle since she has "pinched nerves in her back" Ambulation/Gait Ambulation/Gait Assistance: Not tested (comment)    Exercises     PT Diagnosis: Difficulty walking;Generalized weakness  PT Problem List: Decreased strength;Decreased activity tolerance;Decreased balance;Decreased mobility;Decreased safety  awareness;Decreased knowledge of use of DME PT Treatment Interventions: Gait training;DME instruction;Functional mobility training;Therapeutic activities;Therapeutic exercise;Patient/family education;Balance training   PT Goals Acute Rehab PT Goals PT Goal Formulation: With patient Time For Goal Achievement: 11/18/11 Potential to Achieve Goals: Good Pt will go Supine/Side to Sit: with supervision PT Goal: Supine/Side to Sit - Progress: Goal set today Pt will go Sit to Stand: with supervision PT Goal: Sit to Stand - Progress: Goal set today Pt will go Stand to Sit: with supervision PT Goal: Stand to Sit - Progress: Goal set today Pt will Ambulate: with supervision;with least restrictive assistive device;51 - 150 feet PT Goal: Ambulate - Progress: Goal set today  Visit Information  Last PT Received On: 11/18/11 Assistance Needed: +2    Subjective Data  Subjective: "How does that thing work?"  (female urinal, pt assisted to [facility])   Prior Functioning  Home Living Lives With: Spouse;Other (Comment) (she was caregiver:  he has heart issues/sx) Type of Home: Independent living facility (independent living house) Home Access: Level entry Home Layout: One level Bathroom Shower/Tub: Health visitor: Handicapped height Home Adaptive Equipment: Built-in shower seat;Other (comment) (rollator) Prior Function Level of Independence: Independent with assistive device(s) (they have meals provided:  can be delivered) Communication Communication: No difficulties    Cognition  Overall Cognitive Status: Appears within functional limits for tasks assessed/performed Arousal/Alertness: Awake/alert Orientation Level: Appears intact for tasks assessed Behavior During Session: [facility] for tasks performed    Extremity/Trunk Assessment Right Upper Extremity Assessment RUE ROM/Strength/Tone: Within functional levels (strength 3+/5 bilaterally) Left Upper Extremity Assessment LUE  ROM/Strength/Tone: Within functional levels Right Lower Extremity Assessment RLE ROM/Strength/Tone: Deficits RLE ROM/Strength/Tone Deficits: grossly 3+/5  throughout RLE Sensation: Deficits RLE Sensation Deficits: pt reports numbness in toes Left Lower Extremity Assessment LLE ROM/Strength/Tone: Deficits LLE ROM/Strength/Tone Deficits: grossly 3+/5 throughout LLE Sensation: Deficits LLE Sensation Deficits: pt reports numbness in toes   Balance Balance Balance Assessed: Yes Static Standing Balance Static Standing - Balance Support: Bilateral upper extremity supported Static Standing - Level of Assistance: 4: Min assist Static Standing - Comment/# of Minutes: assist to steady with RW while performing hygiene  End of Session PT - End of Session Equipment Utilized During Treatment: Gait belt Activity Tolerance: Patient tolerated treatment well Patient left: in chair;with call bell/phone within reach   [facility] E 11/18/2011, 11:59 AM Pager: [phone]

## 2011-11-18 NOTE — Progress Notes (Signed)
  Patient ID: [name], female   DOB: 1934/03/04, 78 y.o.   MRN: [id number] Subjective:  No chest pain less dyspnea.  Still nothing PO and nothing in ostomy    Objective:  Vital Signs in the last 24 hours: BP 116/55  Pulse 84  Temp(Src) 98.3 F (36.8 C) (Oral)  Resp 10  Ht 5\' 3"  (1.6 m)  Wt 53.524 kg (118 lb)  BMI 20.90 kg/m2  SpO2 98%  Physical Exam: Pleasant WF in NAD Lungs:  Rhonchi bilat Cardiac:  irgular rhythm, normal S1 and S2, no S3 Abdomen: Dressings present drain still in new ostomy Extremities:  Left hand with some swelling, rings mildly tight.  Mild mottling of index finger.  Pulse present Pacer under left clavicle Right IJ  Intake/Output from previous day: 05/30 0701 - 05/31 0700 In: 1989.3 [I.V.:1725.3; IV Piggyback:264] Out: 2490 [Urine:2300; Drains:190] Weight Filed Weights   11/16/11 0600 11/17/11 0400 11/18/11 0001  Weight: 52.164 kg (115 lb) 52.1 kg (114 lb 13.8 oz) 53.524 kg (118 lb)    Lab Results: Basic Metabolic Panel:  Basename 11/18/11 0427 11/15/11 1320  NA 134* 140  K 4.1 3.8  CL 95* 101  CO2 29 27  GLUCOSE 115* 138*  BUN 37* 28*  CREATININE 1.40* 1.15*    CBC:  Basename 11/17/11 1400 11/16/11 0605  WBC 17.0* 18.4*  NEUTROABS 16.2* --  HGB 9.7* 9.4*  HCT 30.1* 29.9*  MCV 88.8 91.7  PLT 330 308    BNP    Component Value Date/Time   PROBNP >70000.0* 11/16/2011 0605    PROTIME: Lab Results  Component Value Date   INR 1.45 11/12/2011   INR 1.72* 11/12/2011   INR 1.99* 11/11/2011   PROTIME 16.5 11/28/2008    Telemetry: Improved SR with occasional pacing HR lower  Assessment/Plan:  1. Paroxysmal atrial fibrillation currently sinus p synch pacing  Less afib  Continue iv amiodarone and change to PO when possible 2. Cardiomyopathy with EF 30-35% BNP elevated continue bid lasix CXR yesterday no edema ? Atelectasis and aspiration 3. Perm pacer normal function  Rec:  Restart warfarin when able to take po meds. Continue  IV amiodarone for time being. Rebolus Keep on dry side bid lasix with elevated BNP Weight stable I/O's even Cozaar started yesterday 25mg  and BP and Cr stable to date  Charlton Haws 8:33 AM 11/18/2011  Cardiology  11/18/2011, 8:33 AM

## 2011-11-19 ENCOUNTER — Inpatient Hospital Stay (HOSPITAL_COMMUNITY): Payer: Medicare Other | Admitting: Anesthesiology

## 2011-11-19 ENCOUNTER — Encounter (HOSPITAL_COMMUNITY)
Admission: EM | Disposition: A | Discharge status: Nursing Home (Includes ICF) | Payer: Self-pay | Source: Home / Self Care | Attending: Internal Medicine

## 2011-11-19 ENCOUNTER — Inpatient Hospital Stay (HOSPITAL_COMMUNITY): Payer: Medicare Other

## 2011-11-19 ENCOUNTER — Encounter (HOSPITAL_COMMUNITY): Payer: Self-pay | Admitting: Anesthesiology

## 2011-11-19 DIAGNOSIS — T8131XA Disruption of external operation (surgical) wound, not elsewhere classified, initial encounter: Secondary | ICD-10-CM

## 2011-11-19 DIAGNOSIS — T8189XA Other complications of procedures, not elsewhere classified, initial encounter: Secondary | ICD-10-CM

## 2011-11-19 HISTORY — PX: GASTROSTOMY: SHX5249

## 2011-11-19 HISTORY — PX: LAPAROTOMY: SHX154

## 2011-11-19 HISTORY — PX: APPLICATION OF WOUND VAC: SHX5189

## 2011-11-19 LAB — COMPREHENSIVE METABOLIC PANEL
BUN: 37 mg/dL — ABNORMAL HIGH (ref 6–23)
CO2: 27 mEq/L (ref 19–32)
Calcium: 8.3 mg/dL — ABNORMAL LOW (ref 8.4–10.5)
Creatinine, Ser: 1.37 mg/dL — ABNORMAL HIGH (ref 0.50–1.10)
GFR calc Af Amer: 43 mL/min — ABNORMAL LOW (ref >90)
GFR calc non Af Amer: 37 mL/min — ABNORMAL LOW (ref >90)
Glucose, Bld: 83 mg/dL (ref 70–99)
Total Protein: 4.5 g/dL — ABNORMAL LOW (ref 6.0–8.3)

## 2011-11-19 LAB — BASIC METABOLIC PANEL
CO2: 28 mEq/L (ref 19–32)
Chloride: 96 mEq/L (ref 96–112)
Creatinine, Ser: 1.59 mg/dL — ABNORMAL HIGH (ref 0.50–1.10)
GFR calc Af Amer: 36 mL/min — ABNORMAL LOW (ref >90)
Potassium: 4.3 mEq/L (ref 3.5–5.1)
Sodium: 134 mEq/L — ABNORMAL LOW (ref 135–145)

## 2011-11-19 LAB — CBC
HCT: 30.7 % — ABNORMAL LOW (ref 36.0–46.0)
Hemoglobin: 9.8 g/dL — ABNORMAL LOW (ref 12.0–15.0)
MCH: 29.2 pg (ref 26.0–34.0)
MCV: 91.4 fL (ref 78.0–100.0)
Platelets: 267 10*3/uL (ref 150–400)
RBC: 3.36 MIL/uL — ABNORMAL LOW (ref 3.87–5.11)

## 2011-11-19 LAB — DIFFERENTIAL
Eosinophils Absolute: 0 10*3/uL (ref 0.0–0.7)
Eosinophils Relative: 0 % (ref 0–5)
Lymphs Abs: 0.7 10*3/uL (ref 0.7–4.0)
Monocytes Absolute: 0.4 10*3/uL (ref 0.1–1.0)
Monocytes Relative: 2 % — ABNORMAL LOW (ref 3–12)

## 2011-11-19 LAB — MAGNESIUM: Magnesium: 1.4 mg/dL — ABNORMAL LOW (ref 1.5–2.5)

## 2011-11-19 LAB — BLOOD GAS, ARTERIAL
Bicarbonate: 24.6 mEq/L — ABNORMAL HIGH (ref 20.0–24.0)
O2 Saturation: 97 %
Patient temperature: 98.6
TCO2: 22.9 mmol/L (ref 0–100)
pO2, Arterial: 103 mmHg — ABNORMAL HIGH (ref 80.0–100.0)

## 2011-11-19 LAB — LACTIC ACID, PLASMA: Lactic Acid, Venous: 0.9 mmol/L (ref 0.5–2.2)

## 2011-11-19 SURGERY — LAPAROTOMY, EXPLORATORY
Anesthesia: General | Site: Abdomen | Wound class: Dirty or Infected

## 2011-11-19 MED ORDER — FENTANYL CITRATE 0.05 MG/ML IJ SOLN
INTRAMUSCULAR | Status: DC | PRN
  Administered 2011-11-19 (×3): 50 ug via INTRAVENOUS
  Administered 2011-11-19: 100 ug via INTRAVENOUS

## 2011-11-19 MED ORDER — ROCURONIUM BROMIDE 100 MG/10ML IV SOLN
INTRAVENOUS | Status: DC | PRN
  Administered 2011-11-19 (×3): 20 mg via INTRAVENOUS
  Administered 2011-11-19: 30 mg via INTRAVENOUS

## 2011-11-19 MED ORDER — SODIUM CHLORIDE 0.9 % IV SOLN: INTRAVENOUS | Status: DC

## 2011-11-19 MED ORDER — LIDOCAINE HCL (CARDIAC) 20 MG/ML IV SOLN
INTRAVENOUS | Status: DC | PRN
  Administered 2011-11-19: 80 mg via INTRAVENOUS

## 2011-11-19 MED ORDER — PROPOFOL 10 MG/ML IV EMUL
5.0000 ug/kg/min | INTRAVENOUS | Status: DC
  Administered 2011-11-19: 5 ug/kg/min via INTRAVENOUS
  Filled 2011-11-19: qty 100

## 2011-11-19 MED ORDER — MIDAZOLAM HCL 5 MG/ML IJ SOLN
2.0000 mg | INTRAMUSCULAR | Status: DC | PRN
  Administered 2011-11-19 – 2011-11-20 (×4): 2 mg via INTRAVENOUS
  Filled 2011-11-19 (×4): qty 1

## 2011-11-19 MED ORDER — LACTATED RINGERS IV SOLN: INTRAVENOUS | Status: DC

## 2011-11-19 MED ORDER — HYDROMORPHONE HCL PF 1 MG/ML IJ SOLN
INTRAMUSCULAR | Status: DC | PRN
  Administered 2011-11-19 (×4): 0.5 mg via INTRAVENOUS

## 2011-11-19 MED ORDER — KCL IN DEXTROSE-NACL 20-5-0.45 MEQ/L-%-% IV SOLN
INTRAVENOUS | Status: DC
  Administered 2011-11-19 – 2011-11-20 (×2): via INTRAVENOUS
  Administered 2011-11-20: 20 mL via INTRAVENOUS
  Administered 2011-11-22: 20 mL/h via INTRAVENOUS
  Administered 2011-11-23 – 2011-11-29 (×3): via INTRAVENOUS
  Filled 2011-11-19 (×10): qty 1000

## 2011-11-19 MED ORDER — SODIUM CHLORIDE 0.9 % IV SOLN
Freq: Once | INTRAVENOUS | Status: AC
  Administered 2011-11-19: 19:00:00 via INTRAVENOUS

## 2011-11-19 MED ORDER — PROPOFOL 10 MG/ML IV BOLUS
INTRAVENOUS | Status: DC | PRN
  Administered 2011-11-19: 140 mg via INTRAVENOUS

## 2011-11-19 MED ORDER — PHENYLEPHRINE HCL 10 MG/ML IJ SOLN
INTRAMUSCULAR | Status: DC | PRN
  Administered 2011-11-19 (×2): 40 ug via INTRAVENOUS
  Administered 2011-11-19: 80 ug via INTRAVENOUS
  Administered 2011-11-19: 40 ug via INTRAVENOUS

## 2011-11-19 MED ORDER — HYDROMORPHONE HCL PF 1 MG/ML IJ SOLN: 0.2500 mg | INTRAMUSCULAR | Status: DC | PRN

## 2011-11-19 MED ORDER — MAGNESIUM SULFATE 40 MG/ML IJ SOLN
2.0000 g | Freq: Once | INTRAMUSCULAR | Status: AC
  Administered 2011-11-19: 2 g via INTRAVENOUS
  Filled 2011-11-19: qty 50

## 2011-11-19 MED ORDER — SODIUM CHLORIDE 0.9 % IR SOLN
Status: DC | PRN
  Administered 2011-11-19: 6000 mL

## 2011-11-19 MED ORDER — EPHEDRINE SULFATE 50 MG/ML IJ SOLN
INTRAMUSCULAR | Status: DC | PRN
  Administered 2011-11-19 (×2): 5 mg via INTRAVENOUS

## 2011-11-19 MED ORDER — IPRATROPIUM-ALBUTEROL 18-103 MCG/ACT IN AERO
6.0000 | INHALATION_SPRAY | RESPIRATORY_TRACT | Status: DC
  Administered 2011-11-19 – 2011-11-20 (×5): 6 via RESPIRATORY_TRACT
  Filled 2011-11-19: qty 14.7

## 2011-11-19 MED ORDER — SODIUM CHLORIDE 0.9 % IJ SOLN
10.0000 mL | INTRAMUSCULAR | Status: DC | PRN
  Administered 2011-11-19 – 2011-12-01 (×11): 10 mL

## 2011-11-19 MED ORDER — IPRATROPIUM-ALBUTEROL 18-103 MCG/ACT IN AERO: 6.0000 | INHALATION_SPRAY | RESPIRATORY_TRACT | Status: DC | PRN

## 2011-11-19 MED ORDER — MIDAZOLAM HCL 5 MG/5ML IJ SOLN
INTRAMUSCULAR | Status: DC | PRN
  Administered 2011-11-19: 2 mg via INTRAVENOUS

## 2011-11-19 MED ORDER — LACTATED RINGERS IV SOLN
INTRAVENOUS | Status: DC | PRN
  Administered 2011-11-19 (×2): via INTRAVENOUS

## 2011-11-19 MED ORDER — NOREPINEPHRINE BITARTRATE 1 MG/ML IJ SOLN
2.0000 ug/min | INTRAVENOUS | Status: DC
  Filled 2011-11-19: qty 4

## 2011-11-19 MED ORDER — SUCCINYLCHOLINE CHLORIDE 20 MG/ML IJ SOLN
INTRAMUSCULAR | Status: DC | PRN
  Administered 2011-11-19: 100 mg via INTRAVENOUS

## 2011-11-19 MED ORDER — POTASSIUM CHLORIDE 10 MEQ/100ML IV SOLN
10.0000 meq | INTRAVENOUS | Status: AC
  Administered 2011-11-19 (×4): 10 meq via INTRAVENOUS
  Filled 2011-11-19: qty 400

## 2011-11-19 MED ORDER — SODIUM CHLORIDE 0.9 % IV BOLUS (SEPSIS)
1000.0000 mL | Freq: Once | INTRAVENOUS | Status: AC
  Administered 2011-11-19: 1000 mL via INTRAVENOUS

## 2011-11-19 MED ORDER — SODIUM CHLORIDE 0.9 % IV SOLN
INTRAVENOUS | Status: DC | PRN
  Administered 2011-11-19: 12:00:00 via INTRAVENOUS

## 2011-11-19 MED ORDER — FENTANYL CITRATE 0.05 MG/ML IJ SOLN
100.0000 ug | INTRAMUSCULAR | Status: DC | PRN
  Administered 2011-11-19 – 2011-11-28 (×55): 100 ug via INTRAVENOUS
  Administered 2011-11-28: 50 ug via INTRAVENOUS
  Administered 2011-11-28 – 2011-12-02 (×22): 100 ug via INTRAVENOUS
  Filled 2011-11-19 (×79): qty 2

## 2011-11-19 MED ORDER — HYDROCORTISONE SOD SUCCINATE 100 MG IJ SOLR
50.0000 mg | Freq: Four times a day (QID) | INTRAMUSCULAR | Status: DC
  Administered 2011-11-19 – 2011-11-21 (×6): 50 mg via INTRAVENOUS
  Filled 2011-11-19 (×2): qty 1
  Filled 2011-11-19: qty 2
  Filled 2011-11-19 (×2): qty 1
  Filled 2011-11-19: qty 2
  Filled 2011-11-19 (×4): qty 1

## 2011-11-19 MED ORDER — ACETAMINOPHEN 10 MG/ML IV SOLN
INTRAVENOUS | Status: DC | PRN
  Administered 2011-11-19: 1000 mg via INTRAVENOUS

## 2011-11-19 MED ORDER — SODIUM CHLORIDE 0.9 % IV SOLN
20.0000 mg | INTRAVENOUS | Status: DC | PRN
  Administered 2011-11-19: 40 ug/min via INTRAVENOUS

## 2011-11-19 SURGICAL SUPPLY — 52 items
APPLICATOR COTTON TIP 6IN STRL (MISCELLANEOUS) IMPLANT
BAG URINE DRAINAGE (UROLOGICAL SUPPLIES) ×3 IMPLANT
BLADE EXTENDED COATED 6.5IN (ELECTRODE) IMPLANT
BLADE HEX COATED 2.75 (ELECTRODE) ×3 IMPLANT
BLADE SURG 15 STRL LF DISP TIS (BLADE) ×2 IMPLANT
BLADE SURG 15 STRL SS (BLADE) ×1
CANISTER SUCTION 2500CC (MISCELLANEOUS) IMPLANT
CATH MUSHROOM 24FR (CATHETERS) ×3 IMPLANT
CLAMP POUCH DRAINAGE QUIET (OSTOMY) ×3 IMPLANT
CLOTH BEACON ORANGE TIMEOUT ST (SAFETY) ×3 IMPLANT
COVER MAYO STAND STRL (DRAPES) ×3 IMPLANT
DEVICE TROCAR PUNCTURE CLOSURE (ENDOMECHANICALS) ×3 IMPLANT
DRAPE LAPAROSCOPIC ABDOMINAL (DRAPES) ×3 IMPLANT
DRAPE WARM FLUID 44X44 (DRAPE) ×3 IMPLANT
DRSG TEGADERM 4X4.75 (GAUZE/BANDAGES/DRESSINGS) ×3 IMPLANT
DRSG VAC ATS MED SENSATRAC (GAUZE/BANDAGES/DRESSINGS) ×3 IMPLANT
ELECT REM PT RETURN 9FT ADLT (ELECTROSURGICAL) ×3
ELECTRODE REM PT RTRN 9FT ADLT (ELECTROSURGICAL) ×2 IMPLANT
GLOVE BIOGEL PI IND STRL 7.0 (GLOVE) IMPLANT
GLOVE BIOGEL PI INDICATOR 7.0 (GLOVE)
GLOVE SURG SIGNA 7.5 PF LTX (GLOVE) ×15 IMPLANT
GLOVE SURG SS PI 8.5 STRL IVOR (GLOVE) ×3
GLOVE SURG SS PI 8.5 STRL STRW (GLOVE) ×6 IMPLANT
GOWN STRL NON-REIN LRG LVL3 (GOWN DISPOSABLE) IMPLANT
GOWN STRL REIN 2XL LVL4 (GOWN DISPOSABLE) ×6 IMPLANT
GOWN STRL REIN XL XLG (GOWN DISPOSABLE) ×9 IMPLANT
KIT BASIN OR (CUSTOM PROCEDURE TRAY) ×3 IMPLANT
NS IRRIG 1000ML POUR BTL (IV SOLUTION) ×18 IMPLANT
PACK GENERAL/GYN (CUSTOM PROCEDURE TRAY) ×3 IMPLANT
SPONGE GAUZE 4X4 12PLY (GAUZE/BANDAGES/DRESSINGS) ×6 IMPLANT
SPONGE LAP 18X18 X RAY DECT (DISPOSABLE) ×3 IMPLANT
STAPLER VISISTAT 35W (STAPLE) ×3 IMPLANT
SUCTION POOLE TIP (SUCTIONS) ×3 IMPLANT
SUT ETHIBOND 5 LR DA (SUTURE) ×12 IMPLANT
SUT NOVA 1 T20/GS 25DT (SUTURE) ×15 IMPLANT
SUT PDS AB 1 CTX 36 (SUTURE) IMPLANT
SUT RET BRIDGE (SUTURE) ×3 IMPLANT
SUT SILK 2 0 (SUTURE) ×1
SUT SILK 2 0 SH CR/8 (SUTURE) ×3 IMPLANT
SUT SILK 2-0 18XBRD TIE 12 (SUTURE) ×2 IMPLANT
SUT SILK 3 0 (SUTURE) ×1
SUT SILK 3 0 SH CR/8 (SUTURE) ×3 IMPLANT
SUT SILK 3-0 18XBRD TIE 12 (SUTURE) ×2 IMPLANT
SUT VIC AB 2-0 SH 18 (SUTURE) ×3 IMPLANT
SUT VICRYL 2 0 18  UND BR (SUTURE)
SUT VICRYL 2 0 18 UND BR (SUTURE) IMPLANT
TISSUE MATRIX STRATTICE 16X20 (Tissue) ×1 IMPLANT
TISSUE MATRIX STRATTICE 20X16 (Tissue) ×2 IMPLANT
TOWEL OR 17X26 10 PK STRL BLUE (TOWEL DISPOSABLE) ×6 IMPLANT
TRAY FOLEY CATH 14FRSI W/METER (CATHETERS) IMPLANT
WATER STERILE IRR 1500ML POUR (IV SOLUTION) IMPLANT
YANKAUER SUCT BULB TIP NO VENT (SUCTIONS) ×3 IMPLANT

## 2011-11-19 NOTE — Progress Notes (Signed)
Patient ID: [name], female   DOB: 01-Jun-1934, 78 y.o.   MRN: [id number] Subjective: No complaints except wound opening up  Objective: Physical Exam: BP 121/61  Pulse 78  Temp(Src) 98.6 F (37 C) (Oral)  Resp 14  Ht 5\' 3"  (1.6 m)  Wt 172 lb 3.2 oz (78.109 kg)  BMI 30.50 kg/m2  SpO2 100%  Abdomen distended Facial dehisence of upper incision with visible bowel    Labs: CBC  Basename 11/17/11 1400  WBC 17.0*  HGB 9.7*  HCT 30.1*  PLT 330   BMET  Basename 11/19/11 0405 11/18/11 0427  NA 134* 134*  K 4.3 4.1  CL 96 95*  CO2 28 29  GLUCOSE 90 115*  BUN 40* 37*  CREATININE 1.59* 1.40*  CALCIUM 9.0 9.1   LFT No results found for this basename: PROT,ALBUMIN,AST,ALT,ALKPHOS,BILITOT,BILIDIR,IBILI,LIPASE in the last 72 hours PT/INR No results found for this basename: LABPROT:2,INR:2 in the last 72 hours   Studies/Results: Dg Chest Port 1 View  11/18/2011  *RADIOLOGY REPORT*  Clinical Data: Assess pulmonary edema.  PORTABLE CHEST - 1 VIEW  Comparison: Chest x-ray of 11/16/2011.  Findings: There is a a right-sided internal jugular central venous catheter with tip terminating in the distal superior vena cava. There is a left-sided pacemaker device in place with lead tips projecting over the expected location of the right atrium and right ventricle.  Compared to the recent prior examinations, the focal airspace opacity in the lateral aspect of the left upper lobe is no longer is clearly visualized.  There continue to be some bibasilar patchy linear areas and regions of interstitial prominence (left greater than right) which may reflect areas of atelectasis and/or sequelae of aspiration.  No definite pleural effusions.  No evidence of pulmonary edema.  Heart size is borderline enlarged. The patient is rotated to the left on today's exam, resulting in distortion of the mediastinal contours and reduced diagnostic sensitivity and specificity for mediastinal pathology. Atherosclerotic  calcifications within the arch of the aorta. Surgical clips project over the left axillary region. A large calcified granuloma is again noted in the periphery of the left apex.  IMPRESSION: 1.  Support apparatus, as above. 2.  Low lung volumes with bibasilar linear opacities and interstitial prominence which may reflect a combination of atelectasis and/or aspiration pneumonitis. 3.  No evidence of pulmonary edema at this time. 4.  Atherosclerosis.  Original Report Authenticated By: Florencia Reasons, M.D.    Assessment/Plan: Facial dehis  Will discuss with Dr. Milta Deiters will need to return to OR for facial closure    LOS: 8 days    Haim Hansson A  11/19/2011 9:40 AM

## 2011-11-19 NOTE — Brief Op Note (Signed)
11/11/2011 - 11/19/2011  3:04 PM  PATIENT:  [name], 78 y.o., female, MRN: [id number]  PREOP DIAGNOSIS:  wound dehisciense  POSTOP DIAGNOSIS:   Wound dehiscence (approx 13 x 15 cm, non healing wound  PROCEDURE:   Procedure(s): EXPLORATORY LAPAROTOMY, #24 mushroom GASTROSTOMY, INSERTION OF biologic MESH (Strattice 16 x 20 cm), placement of retention sutures, APPLICATION OF WOUND VAC (there is a diagram in the chart)  SURGEON:   [name], M.D.  Threasa HeadsBarrie Dunker, M.D.  ANESTHESIA:   general  Gaetano Hawthorne, MD - Anesthesiologist Peggy Williford - CRNA Paris Lore, CRNA - CRNA  General  EBL:  100  ml  BLOOD ADMINISTERED: none  DRAINS: none   LOCAL MEDICATIONS USED:   none  SPECIMEN:   none  COUNTS CORRECT:  YES  INDICATIONS FOR PROCEDURE:  [name] is a 78 y.o. (DOB: 11-Oct-1933) white female whose primary care physician is [name], MD, MD.  She had a sigmoid colectomy with end colostomy on 11/12/2011 for perforated diverticulitis.  She was progressing until she had changes in her wound early this AM.  On exam, she has a dehiscence of her upper wound.  I discussed the need for surgery with her.     The indications and risks of the surgery were explained to the patient.  The risks include, but are not limited to, infection, bleeding, and nerve injury.  She also understands that she would be intubated post op.  Note dictated to:   #[id number]

## 2011-11-19 NOTE — Progress Notes (Signed)
Name: Lennyx Verdell Krauser MRN: [id number] DOB: [birth year]  ELECTRONIC ICU PHYSICIAN NOTE  Problem:  Husband (medical POA) requesting no CPR, no defibrillation.  OK to continue mechanical ventilation, vasoactive drugs and antiarrhythmics.   Intervention:  Orders placed.  RN aware.  Orlean Bradford, M.D. Pulmonary and Critical Care Medicine [facility] Cell: [phone] Pager: [phone]  11/19/2011, 7:00 PM

## 2011-11-19 NOTE — Progress Notes (Signed)
Name: [name] MRN: [id number] DOB: February 12, 1934  ELECTRONIC ICU PHYSICIAN NOTE  Problem:  Back form surgery, mechanically ventilated.  Intervention:  Ventilator / sedation orders sent.  Post intubation PCXR, ABG, CBC, CMP.  Orlean Bradford, M.D. Pulmonary and Critical Care Medicine [facility] Cell: [phone] Pager: [phone]  11/19/2011, 3:53 PM

## 2011-11-19 NOTE — Anesthesia Preprocedure Evaluation (Addendum)
Anesthesia Evaluation  Patient identified by MRN, date of birth, ID band Patient awake    Reviewed: Allergy & Precautions, H&P , NPO status , Patient's Chart, lab work & pertinent test results  Airway Mallampati: II TM Distance: >3 FB Neck ROM: full    Dental No notable dental hx. (+) Dental Advisory Given   Pulmonary asthma , pneumonia , COPD COPD inhaler,  breath sounds clear to auscultation  Pulmonary exam normal       Cardiovascular Exercise Tolerance: Poor hypertension, Pt. on medications + dysrhythmias Atrial Fibrillation + pacemaker Rhythm:regular Rate:Normal  CHF.  Systolic with EF 16%.  LBBB.  No CP.  CXR free of pulmonary edema at present.   Neuro/Psych negative neurological ROS  negative psych ROS   GI/Hepatic negative GI ROS, Neg liver ROS, GERD-  Medicated and Controlled,  Endo/Other  negative endocrine ROSHypothyroidism   Renal/GU negative Renal ROS  negative genitourinary   Musculoskeletal   Abdominal   Peds  Hematology negative hematology ROS (+) Blood dyscrasia, anemia , Hgb. 9.7   Anesthesia Other Findings   Reproductive/Obstetrics negative OB ROS                        Anesthesia Physical Anesthesia Plan  ASA: IV and Emergent  Anesthesia Plan: General   Post-op Pain Management:    Induction: Intravenous  Airway Management Planned: Oral ETT  Additional Equipment:   Intra-op Plan:   Post-operative Plan: Possible Post-op intubation/ventilation  Informed Consent: I have reviewed the patients History and Physical, chart, labs and discussed the procedure including the risks, benefits and alternatives for the proposed anesthesia with the patient or authorized representative who has indicated his/her understanding and acceptance.   Dental Advisory Given  Plan Discussed with: CRNA and Surgeon  Anesthesia Plan Comments:         Anesthesia Quick Evaluation

## 2011-11-19 NOTE — Anesthesia Postprocedure Evaluation (Signed)
  Anesthesia Post-op Note  Patient: [name]  Procedure(s) Performed: Procedure(s) (LRB): EXPLORATORY LAPAROTOMY (N/A) GASTROSTOMY () INSERTION OF MESH (N/A) APPLICATION OF WOUND VAC (N/A)  Patient Location: ICU  Anesthesia Type: General  Level of Consciousness: sedated   Airway and Oxygen Therapy: on ventilator  Post-op Pain: mild  Post-op Assessment: Post-op Vital signs reviewed, Patient's Cardiovascular Status Stable, Respiratory Function Stable, Intubated and No signs of Nausea or vomiting  Post-op Vital Signs: stable  Complications: No apparent anesthesia complications

## 2011-11-19 NOTE — Progress Notes (Signed)
[place] CARDS Subjective:  No CP, feels a little better. Less SOB today. Converted to [employer] working well.   Objective:  Vital Signs in the last 24 hours: Temp:  [97.5 F (36.4 C)-98.6 F (37 C)] 98.6 F (37 C) (06/01 0700) Pulse Rate:  [77-85] 78  (06/01 0700) Resp:  [14-21] 18  (06/01 0700) BP: (107-125)/(61-76) 121/61 mmHg (06/01 0700) SpO2:  [95 %-100 %] 100 % (06/01 0700) Weight:  [78.109 kg (172 lb 3.2 oz)] 78.109 kg (172 lb 3.2 oz) (05/31 1540)  Intake/Output from previous day: 05/31 0701 - 06/01 0700 In: 587.7 [I.V.:550.2; IV Piggyback:37.5] Out: 275 [Urine:225; Drains:50]   Physical Exam: General: Well developed, well nourished, in no acute distress. Head:  Normocephalic and atraumatic. Lungs: Mild rhonchi. Heart: Normal S1 and S2.  No murmur, rubs or gallops.  Abdomen: large, protuberant Extremities: No clubbing or cyanosis. No significant edema left hand. Neurologic: Alert and oriented x 3.    Lab Results:  Basename 11/17/11 1400  WBC 17.0*  HGB 9.7*  PLT 330    Basename 11/19/11 0405 11/18/11 0427  NA 134* 134*  K 4.3 4.1  CL 96 95*  CO2 28 29  GLUCOSE 90 115*  BUN 40* 37*  CREATININE 1.59* 1.40*   Imaging: Dg Chest Port 1 View  11/18/2011  *RADIOLOGY REPORT*  Clinical Data: Assess pulmonary edema.  PORTABLE CHEST - 1 VIEW  Comparison: Chest x-ray of 11/16/2011.  Findings: There is a a right-sided internal jugular central venous catheter with tip terminating in the distal superior vena cava. There is a left-sided pacemaker device in place with lead tips projecting over the expected location of the right atrium and right ventricle.  Compared to the recent prior examinations, the focal airspace opacity in the lateral aspect of the left upper lobe is no longer is clearly visualized.  There continue to be some bibasilar patchy linear areas and regions of interstitial prominence (left greater than right) which may reflect areas of atelectasis and/or  sequelae of aspiration.  No definite pleural effusions.  No evidence of pulmonary edema.  Heart size is borderline enlarged. The patient is rotated to the left on today's exam, resulting in distortion of the mediastinal contours and reduced diagnostic sensitivity and specificity for mediastinal pathology. Atherosclerotic calcifications within the arch of the aorta. Surgical clips project over the left axillary region. A large calcified granuloma is again noted in the periphery of the left apex.  IMPRESSION: 1.  Support apparatus, as above. 2.  Low lung volumes with bibasilar linear opacities and interstitial prominence which may reflect a combination of atelectasis and/or aspiration pneumonitis. 3.  No evidence of pulmonary edema at this time. 4.  Atherosclerosis.  Original Report Authenticated By: Florencia Reasons, M.D.     Telemetry: NSR currently, V paced Personally viewed.   Assessment/Plan:   78 year old with cardiomyopathy, pacer, EF 30%, elevated BNP, paroxysmal atrial fibrillation, ostomy.  1. Atrial fibrillation - currently in NSR, V paced. Reassuring. Amiodarone now 200mg  PO BID.  2. Pacemaker - functioning well.  3. Acute systolic heart failure - EF 30%, BNP elevated. Agree with IV lasix. Creat from 1.4 to 1.59. May need to cut back on diuresis but would continue for now. Tomorrow will likely advocate switch to PO. Her dyspnea is improved today and her left hand edema is improved. Monitor creat.   4. HTN/cardiomyopathy - Cozaar 25mg . Also monitor creat.     [name] 11/19/2011, 7:53 AM

## 2011-11-19 NOTE — Transfer of Care (Signed)
Immediate Anesthesia Transfer of Care Note  Patient: [name]  Procedure(s) Performed: Procedure(s) (LRB): EXPLORATORY LAPAROTOMY (N/A) GASTROSTOMY () INSERTION OF MESH (N/A) APPLICATION OF WOUND VAC (N/A)  Patient Location: PACU and ICU  Anesthesia Type: General  Level of Consciousness: sedated and Patient remains intubated per anesthesia plan  Airway & Oxygen Therapy: Patient connected to T-piece oxygen  Post-op Assessment: Report given to PACU RN and Post -op Vital signs reviewed and stable  Post vital signs: Reviewed and stable  Complications: No apparent anesthesia complications

## 2011-11-19 NOTE — Progress Notes (Signed)
Name: [name] MRN: [id number] DOB: Oct 29, 1933  ELECTRONIC ICU PHYSICIAN NOTE  Problem:  Still hypotensive after 1000 mL fluid bolus.  Labs reviewed: K 3.3, Mg 1.4. lactate 0.9.  Intervention:  Repeat NS 1000 mL bolus, CVP q4h - goal 10-12, Levophed gtt - goal MAP 60-65, d/c Propofol, start Fentanyl / Versed boluses PRN sedation, replace K/Mg.  Orlean Bradford, M.D. Pulmonary and Critical Care Medicine [facility] Cell: [phone] Pager: [phone]  11/19/2011, 6:29 PM

## 2011-11-19 NOTE — Preoperative (Signed)
Beta Blockers   Reason not to administer Beta Blockers: Held due to cardiac instability

## 2011-11-20 DIAGNOSIS — J9600 Acute respiratory failure, unspecified whether with hypoxia or hypercapnia: Secondary | ICD-10-CM | POA: Diagnosis present

## 2011-11-20 LAB — BLOOD GAS, ARTERIAL
Acid-base deficit: 0.1 mmol/L (ref 0.0–2.0)
FIO2: 0.3 %
Mode: POSITIVE
O2 Saturation: 96.5 %
TCO2: 21.2 mmol/L (ref 0–100)
pCO2 arterial: 34.9 mmHg — ABNORMAL LOW (ref 35.0–45.0)

## 2011-11-20 LAB — GLUCOSE, CAPILLARY
Glucose-Capillary: 67 mg/dL — ABNORMAL LOW (ref 70–99)
Glucose-Capillary: 102 mg/dL — ABNORMAL HIGH (ref 70–99)

## 2011-11-20 LAB — BASIC METABOLIC PANEL
BUN: 30 mg/dL — ABNORMAL HIGH (ref 6–23)
Chloride: 100 mEq/L (ref 96–112)
GFR calc Af Amer: 49 mL/min — ABNORMAL LOW (ref >90)
GFR calc non Af Amer: 42 mL/min — ABNORMAL LOW (ref >90)
Potassium: 4.5 mEq/L (ref 3.5–5.1)
Sodium: 132 mEq/L — ABNORMAL LOW (ref 135–145)

## 2011-11-20 LAB — CBC
HCT: 32.5 % — ABNORMAL LOW (ref 36.0–46.0)
Hemoglobin: 10 g/dL — ABNORMAL LOW (ref 12.0–15.0)
MCHC: 30.8 g/dL (ref 30.0–36.0)
MCV: 91.3 fL (ref 78.0–100.0)
Platelets: 326 10*3/uL (ref 150–400)
RBC: 3.55 MIL/uL — ABNORMAL LOW (ref 3.87–5.11)
RDW: 14.8 % (ref 11.5–15.5)
WBC: 25.1 10*3/uL — ABNORMAL HIGH (ref 4.0–10.5)

## 2011-11-20 MED ORDER — IPRATROPIUM-ALBUTEROL 18-103 MCG/ACT IN AERO
2.0000 | INHALATION_SPRAY | RESPIRATORY_TRACT | Status: DC
  Administered 2011-11-20 – 2011-11-28 (×46): 2 via RESPIRATORY_TRACT
  Filled 2011-11-20: qty 14.7

## 2011-11-20 MED ORDER — TRAVASOL 10 % IV SOLN: INTRAVENOUS | Status: DC

## 2011-11-20 MED ORDER — CLINIMIX E/DEXTROSE (5/15) 5 % IV SOLN
INTRAVENOUS | Status: AC
  Administered 2011-11-20: 18:00:00 via INTRAVENOUS
  Filled 2011-11-20: qty 1000

## 2011-11-20 MED ORDER — DEXTROSE 50 % IV SOLN
INTRAVENOUS | Status: AC
  Administered 2011-11-20: 25 mL
  Filled 2011-11-20: qty 50

## 2011-11-20 MED ORDER — INSULIN ASPART 100 UNIT/ML ~~LOC~~ SOLN
0.0000 [IU] | Freq: Four times a day (QID) | SUBCUTANEOUS | Status: DC
Start: 2011-11-20 — End: 2011-11-24
  Administered 2011-11-20 – 2011-11-22 (×4): 1 [IU] via SUBCUTANEOUS

## 2011-11-20 NOTE — Progress Notes (Signed)
Nutrition Follow-up/Consult for new TPN  Pt with perforated diverticular abscess; resection, abscess drainage 5/25.  Underwent wound closure/mesh 6/2.  Extubated today.  TPN:  Pt to begin Clinimix E 5/15 at 40 ml/hr.  20/% lipids MWF at 10 ml/hr.    Diet Order:  NPO  Meds: Scheduled Meds:   . sodium chloride   Intravenous Once  . albuterol-ipratropium  2 puff Inhalation Q4H  . amiodarone  200 mg Oral BID  . antiseptic oral rinse  15 mL Mouth Rinse QID  . chlorhexidine  15 mL Mouth Rinse BID  . DULoxetine  30 mg Oral BID  . furosemide  20 mg Intravenous Q12H  . heparin subcutaneous  5,000 Units Subcutaneous Q8H  . hydrocortisone sodium succinate  50 mg Intravenous Q6H  . insulin aspart  0-9 Units Subcutaneous Q6H  . magnesium sulfate 1 - 4 g bolus IVPB  2 g Intravenous Once  . pantoprazole (PROTONIX) IV  40 mg Intravenous Q24H  . piperacillin-tazobactam (ZOSYN)  IV  3.375 g Intravenous Q8H  . potassium chloride  10 mEq Intravenous Q1 Hr x 4  . sodium chloride  1,000 mL Intravenous Once  . sodium chloride  1,000 mL Intravenous Once  . DISCONTD: albuterol-ipratropium  6 puff Inhalation Q4H  . DISCONTD: budesonide  0.25 mg Nebulization Q6H  . DISCONTD: HYDROmorphone PCA 0.3 mg/mL   Intravenous Q4H  . DISCONTD: ipratropium  0.5 mg Nebulization Q6H  . DISCONTD: levalbuterol  0.63 mg Nebulization Q6H  . DISCONTD: levothyroxine  38 mcg Intravenous Daily  . DISCONTD: losartan  25 mg Oral Daily  . DISCONTD: methylPREDNISolone (SOLU-MEDROL) injection  20 mg Intravenous Daily  . DISCONTD: traZODone  100 mg Per Tube QHS  . DISCONTD: zolpidem  5 mg Per Tube QHS   Continuous Infusions:   . dextrose 5 % and 0.45 % NaCl with KCl 20 mEq/L 20 mL (11/20/11 1041)  . TPN (CLINIMIX) +/- additives    . DISCONTD: sodium chloride 20 mL/hr (11/14/11 0258)  . DISCONTD: sodium chloride    . DISCONTD: ADULT TPN    . DISCONTD: dextrose 5 % and 0.45 % NaCl with KCl 40 mEq/L 50 mL/hr at 11/19/11 1001  .  DISCONTD: lactated ringers    . DISCONTD: lactated ringers    . DISCONTD: norepinephrine (LEVOPHED) Adult infusion    . DISCONTD: propofol 5 mcg/kg/min (11/19/11 1630)   PRN Meds:.acetaminophen, fentaNYL, midazolam, sodium chloride, DISCONTD: albuterol-ipratropium, DISCONTD: diphenhydrAMINE, DISCONTD:  HYDROmorphone (DILAUDID) injection, DISCONTD: naloxone, DISCONTD: ondansetron (ZOFRAN) IV, DISCONTD: sodium chloride, DISCONTD: sodium chloride irrigation  Labs:  CMP     Component Value Date/Time   NA 132* 11/20/2011 0425   K 4.5 11/20/2011 0425   CL 100 11/20/2011 0425   CO2 25 11/20/2011 0425   GLUCOSE 141* 11/20/2011 0425   BUN 30* 11/20/2011 0425   CREATININE 1.22* 11/20/2011 0425   CALCIUM 8.0* 11/20/2011 0425   PROT 4.5* 11/19/2011 1625   ALBUMIN 2.0* 11/19/2011 1625   AST 18 11/19/2011 1625   ALT 15 11/19/2011 1625   ALKPHOS 40 11/19/2011 1625   BILITOT 0.5 11/19/2011 1625   GFRNONAA 42* 11/20/2011 0425   GFRAA 49* 11/20/2011 0425     Intake/Output Summary (Last 24 hours) at 11/20/11 1223 Last data filed at 11/20/11 1200  Gross per 24 hour  Intake   5250 ml  Output   1425 ml  Net   3825 ml    Weight Status:  83.3kg (6/2)  Re-estimated needs:  1400-1500 kcal,  75-85 g protein per day  Nutrition Dx:  Inadequate oral intake continues.  Goal:  TPN to meet >90% estimated needs.  Diet advancement as able.  Intervention:  Monitor TPN with Pharmacy.  TPN Goal of 70 ml/hr + lipids to meet estimated needs and provide:  1399 kcal, 84 grams protein (daily average).  Monitor:  TPN, labs, weight trend, plan of care and diet advancement   Jeoffrey Massed Pager #:  [phone]

## 2011-11-20 NOTE — Progress Notes (Signed)
General Surgery Note  LOS: 9 days  POD# 8 and 1 Room - 1239  Assessment/Plan: 1.  EXPLORATORY LAPAROTOMY, COLON RESECTION SIGMOID INCISION AND DRAINAGE ABSCESS for perforated diverticulitis - 11/12/2011 - Dr. Trude Mcburney  On Zosyn.  WBC down a little to 25,500 - 11/20/2011.  2.  Wound dehiscence - Closed with Strattice mesh and VAC - D. Ezzard Standing - 11/19/2011  Son at bedside.  Overall she looks much better than yesterday.  Plan:  VAC change MWF, TPN until colostomy functioning, Vent management per CCM.    3. Atrial fibrillation - seen by Dr. Burna Forts  On amiodarone.  4.  History of HEART FAILURE    5.  History of hypertension  6.  Chronic obstructive asthma.  On vent post op - management per CCM.  7.  DVT proph - on Lovenox 8.  HYPOTHYROIDISM 9.  On isolation for MRSA.  Subjective:  Alert and responds well.  Son (he is from [place]) at bedside. Objective:   Filed Vitals:   11/20/11 0630  BP: 116/53  Pulse: 95  Temp:   Resp: 18     Intake/Output from previous day:  06/01 0701 - 06/02 0700 In: 5000 [I.V.:3550; IV Piggyback:1450] Out: 1250 [Urine:1000; Drains:150; Blood:100]  Intake/Output this shift:  Total I/O In: 2550 [I.V.:1100; IV Piggyback:1450] Out: 650 [Urine:500; Drains:150]   Physical Exam:   General: Older WF who is alert.  Intubated.   HEENT: Normal. Pupils equal. .   Lungs: Some rhonchi.   Abdomen: Mild distended.     Wound: VAC on wound.   Neurologic:  Grossly intact to motor and sensory function.   Lab Results:     Basename 11/20/11 0425 11/19/11 1625  WBC 25.5* 14.8*  HGB 10.0* 9.8*  HCT 32.5* 30.7*  PLT 314 267    BMET    Basename 11/20/11 0425 11/19/11 1625  NA 132* 134*  K 4.5 3.3*  CL 100 100  CO2 25 27  GLUCOSE 141* 83  BUN 30* 37*  CREATININE 1.22* 1.37*  CALCIUM 8.0* 8.3*    PT/INR  No results found for this basename: LABPROT:2,INR:2 in the last 72 hours  ABG    Basename 11/19/11 1649  PHART 7.333*  HCO3 24.6*      Studies/Results:  Dg Chest Port 1 View  11/19/2011  *RADIOLOGY REPORT*  Clinical Data: Status post intubation  PORTABLE CHEST - 1 VIEW  Comparison: 11/18/2011  Findings: Left chest wall pacer device is noted with lead in the right atrial appendage and right ventricle.  ET tube tip is above the carina.  There is a right IJ catheter with tip in the SVC.  Heart size is normal.  There are low lung volumes. Bibasilar opacities are unchanged from previous exam.  IMPRESSION:  1.  Satisfactory position of the endotracheal tube with tip just above the carina.  Original Report Authenticated By: Rosealee Albee, M.D.     Anti-infectives:   Anti-infectives     Start     Dose/Rate Route Frequency Ordered Stop   11/11/11 2000   piperacillin-tazobactam (ZOSYN) IVPB 3.375 g        3.375 g 12.5 mL/hr over 240 Minutes Intravenous Every 8 hours 11/11/11 1928 11/20/11 2359   11/11/11 1130   piperacillin-tazobactam (ZOSYN) IVPB 3.375 g        3.375 g 100 mL/hr over 30 Minutes Intravenous  Once 11/11/11 1050 11/11/11 1200   11/11/11 1130   vancomycin (VANCOCIN) IVPB 1000 mg/200 mL premix  1,000 mg 200 mL/hr over 60 Minutes Intravenous  Once 11/11/11 1050 11/11/11 1341          [name], MD, FACS Pager: [phone],   [facility] Office: [phone] 11/20/2011

## 2011-11-20 NOTE — Progress Notes (Signed)
Name: [name] MRN: [id number] DOB: [birth year]    LOS: 9  Referring Provider:  EDP Reason for Referral:  shock  PULMONARY / CRITICAL CARE MEDICINE  BRIEF PROFILE:  53 yowf with steroid dep obstructive lung disease, CHF, CAF, divverticulitis adm by PCCM via [facility] am 5/24 with increased abd pain and hypotension. CT abd c/w free air and divertular abscess. Ex-lap 5/24 PM revealed perforated sigmoid diverticulitis. Underwent sigmoid colon resection and abscess drainage (Dr Biagio Quint). Remained intubated post op  SUBJECTIVE:  6/1- wound dehiscence, back to OR, post op resp failure  Vital Signs: Temp:  [97.4 F (36.3 C)-99.6 F (37.6 C)] 99.1 F (37.3 C) (06/02 0820) Pulse Rate:  [78-95] 89  (06/02 0830) Resp:  [14-21] 20  (06/02 0830) BP: (80-137)/(31-69) 106/41 mmHg (06/02 0830) SpO2:  [92 %-100 %] 97 % (06/02 0830) FiO2 (%):  [29.3 %-60 %] 29.7 % (06/02 0830) Weight:  [83.3 kg (183 lb 10.3 oz)] 83.3 kg (183 lb 10.3 oz) (06/02 0400) 4 liters  Physical Examination: HEENT: WNL, jvd, ETT Neck: ETT Chest: coarse Heart: s1s2 no murmur Abd: No BS, wound vac Ext: warm, no edema Neuro: no focal deficits, follows commands  Active Problems:  HYPOTHYROIDISM  Atrial fibrillation  HEART FAILURE  History of hypertension  Respiratory failure following trauma and surgery  Chronic obstructive asthma  Perforated diverticulitis  Peritonitis  Hypotension   ASSESSMENT AND PLAN  PULMONARY Acute respiratory Failure, post op 6/1 COPD/fixed asthma, (Xolair, Singulair, Pred (7.5) are chronic meds). plan -pcxr reviewed, no overt failure -abg reviewed -wean this am CPAP 5 PS 5, assess abg , rsbi -concern is some change in resp mechanics with abdomen, assess NIF, VC -pcxr in am   CARDIOVASCULAR Cardiology consult 5/24   A: Afib w/ RVR, chronic systolic CHF (EF 40-98%) P:  -Per cards -Amiodarone remain oral, d/w cards -appeared hemoconcentrated for sure on cbc, reduce fluids, but  allow slight pos balance Stress steroids remain, lower in am , need to reduce when able with wound healing  RENAL  Lab 11/20/11 0425 11/19/11 1625 11/19/11 0405 11/18/11 0427 11/15/11 1320  NA 132* 134* 134* 134* 140  K 4.5 3.3* -- -- --  CL 100 100 96 95* 101  CO2 25 27 28 29 27   BUN 30* 37* 40* 37* 28*  CREATININE 1.22* 1.37* 1.59* 1.40* 1.15*  CALCIUM 8.0* 8.3* 9.0 9.1 9.2  MG -- 1.4* -- 1.6 --  PHOS -- 2.8 -- 3.8 --   Intake/Output      06/01 0701 - 06/02 0700 06/02 0701 - 06/03 0700   I.V. (mL/kg) 3650 (43.8) 100 (1.2)   IV Piggyback 1450    Total Intake(mL/kg) 5100 (61.2) 100 (1.2)   Urine (mL/kg/hr) 1000 (0.5) 100   Drains 150    Blood 100    Total Output 1250 100   Net +3850 0         Foley:  In ER 5/24  A:  AKI / ATN, resolved P:   -CBC was hemoconcentration -Assess cvp -pcxr in am Even balance goal, reduction slight  GASTROINTESTINAL  Lab 11/19/11 1625  AST 18  ALT 15  ALKPHOS 40  BILITOT 0.5  PROT 4.5*  ALBUMIN 2.0*   Diet : NPO  A:  Post op ileus - nutritional status an issue, tpn start -lft ppi  HEMATOLOGIC  Lab 11/20/11 0425 11/19/11 1625 11/17/11 1400 11/16/11 0605 11/15/11 0552  HGB 10.0* 9.8* 9.7* 9.4* 9.5*  HCT 32.5* 30.7* 30.1* 29.9*  30.4*  PLT 314 267 330 308 347  INR -- -- -- -- --  APTT -- -- -- -- --   A:  Coagulopathic from coumadin With no ostomy output unsure if I would start coumadin now  INFECTIOUS  Lab 11/20/11 0425 11/19/11 1625 11/17/11 1400 11/16/11 0605 11/15/11 0552  WBC 25.5* 14.8* 17.0* 18.4* 19.4*  PROCALCITON -- -- -- 1.73 --   Cultures: UC 5/24 >>15K mult orgs.  BC x2 5/24 >>  Antibiotics: Vancomycin  5/24 only Zosyn 5/24 >>  A:  Diverticular abscess due to perforated divertic/ low grade sepsis  6/1- wound dehiscence P:  Zosyn to add stop date June 2, may consider extension after events day prior  ENDOCRINE  A:  Hypothyroidism Plan: -synthroid  NEUROLOGIC  A:  Dementia dx pre op>>normal  mental status 5/27, now reporting hallucinations at HS.   P:  -prn fent, versed WUA Upright important, now abdo muscle issue  BEST PRACTICE / DISPOSITION - Level of Care: SDU, consider to floor if to oral amio - Primary Service:  PCCM - Consultants:  CCS/ gen surgery - Code Status:  Full - Diet:  See GI - DVT Px: PAS - GI Px:  PPI  Ccm 30 min   Mcarthur Rossetti. [name], MD, FACP Pgr: [phone] [facility]

## 2011-11-20 NOTE — Op Note (Signed)
NAME:  SARALEE, [name]                  ACCOUNT NO.:  [id number]  MEDICAL RECORD NO.:  [id number]  LOCATION:  1239                         FACILITY:  [facility]  PHYSICIAN:  Sandria Bales. Ezzard Standing, M.D.  DATE OF BIRTH:  12/19/1933  DATE OF PROCEDURE:  11/19/2011                              OPERATIVE REPORT  PREOPERATIVE DIAGNOSIS:  Wound dehiscence.  POSTOPERATIVE DIAGNOSIS:  A 13 x 15 cm area of wound dehiscence between the midline incision to the LUQ ostomy in a nonhealing wound.  PROCEDURES:  Exploratory laparotomy, placement of #24 mushroom gastrostomy tube, insertion of biologics mesh (Strattice 16 x 20 cm), closure of dehiscence, application of wound vacuum-assisted closure   (there is a diagram of the procedure in the progress note).  SURGEON:  Sandria Bales. Ezzard Standing, M.D.  FIRST ASSISTANT:  Abigail Miyamoto, M.D.  ANESTHESIA:  General endotracheal, supervised by Dr. Ronelle Nigh.  ESTIMATED BLOOD LOSS:  100 mL.  DRAINS:  Drains left in were none.  The wound did have a VAC drain on it.  #24 Mushroom catheter for gastrostomy tube.  I use no local anesthetic.  INDICATION:  [name] is an unfortunate 78 year old white female who sees Dr. Larina Earthly as her primary care doctor.  She was operated on by Dr. Lodema Pilot on Nov 12, 2011, for perforated diverticulitis.  He did a sigmoid colectomy and end colostomy.  She was doing well until she felt some changes in her abdominal wound over the last 24 hours, and on exam today had evidence of a dehiscence of the upper part of her wound.  Both, Dr. Magnus Ivan and myself discussed the findings with the patient and need for surgery.  The risks of surgery include bleeding, injury to the bowel, and infection, which she already has focally.  Also, she understands that she will be back on a ventilator postop because of the change in her abdomen.  PROCEDURE IN DETAIL:  The patient was taken to room #[facility] at [facility], underwent a general anesthesia,  supervised by Dr. Ronelle Nigh.  A time-out was held and surgical checklist run.  She had a drain coming out of her right lower quadrant.  I removed this drain.  I took the staples out of her wound, which promptly fell apart.  She has no evidence of wound healing despite being almost 7 days postop.  I think this is consistent with her long-term steroid use. Her upper part of the wound was torn apart from the midline to the left upper quadrant colostomy.  There was a defect in her abdominal wall which was approximately 13 x 15 cm.  The lower part of the midline wound and fascia appear intact.  It seemed impractical to try to close this primarily, and I thought her best chance of surviving this episode was to do damage control by placing a piece of biologic mesh to cover the defect and try to support the lower part of the incision so it did not fall apart.  I irrigated the abdomen with about 4 L of saline.  I was able to explore the abdomen fairly well, found no loculated pus or inflammation.  I then placed 3  retention sutures in the lower half of the wound to support that fascial closure.  I used #5 Ethibond sutures for the retention sutures and attached these to bridges.  Then I took a piece of 16 x 20 Strattice mesh, and placed this in an angular fashion to cover the fascial defect of the upper abdomen and an underlay about two-thirds of the lower part of the midline incision.  I made a slit where the ostomy was.    Prior to placing a mesh, because of what she did have a prolonged ileus and possible intubation, I did place a gastrostomy tube in the left upper quadrant. I used a cut-off 24 mushroom catheter to the anterior wall of the stomach.  I placed two pursestrings 2-0 Vicryl sutures.  I brought this through a stab wound into the left upper quadrant, and sewed the stomach in 3 places to the anterior abdominal wall with 2-0 Vicryl sutures.  The tube was then sewn in place with  #1 Novafil suture.  I then carried out a repair using this Strattice mesh as an underlay mesh.  I put sutures along the edges of mesh, approximately 21 sutures, and brought these out through stab wounds in a transfascial pattern of her abdominal wall.  I was able to visualize the suture placement fairly well to avoid any bowel injury.  The sutures were spaced about 2 to 3 cm apart, but I thought were close enough that would not allow herniation of the small bowel.  I tried to place the omentum underneath the Strattice mesh.  As the mesh was tied, I did make a slit to kind of go around the ostomy.  I placed a VAC system on the wound.  She is transferred directly to the ICU, her sponge and needle count were correct at the end of the case.  She tolerated the procedure well, considering her compromised condition.  She is in critical condition.  I spoke with Dr. Rory Percy for critical care medicine about postoperative management and leaving her intubated.   Sandria Bales. Ezzard Standing, M.D., FACS   DHN/MEDQ  D:  11/19/2011  T:  11/20/2011  Job:  [id number]  cc:   Jake Bathe, MD Fax: [phone]  Nelda Bucks, MD [address] [place] [address]  Larina Earthly, M.D. Fax: [phone]  Lodema Pilot, MD [address] [place], [place] [address]

## 2011-11-20 NOTE — Progress Notes (Signed)
MD informed of pt's hypoglycemic event. Patient is stable. We continue to monitor.

## 2011-11-20 NOTE — Procedures (Signed)
Extubation Procedure Note  Patient Details:   Name: [name] DOB: 02/17/1934 MRN: [id number]   Airway Documentation:  Airway 7.5 mm (Active)  Secured at (cm) 19 cm 11/20/2011  9:34 AM  Measured From Lips 11/20/2011  9:34 AM  Secured Location Center 11/20/2011  9:34 AM  Secured By [employer] 11/20/2011  9:34 AM  Tube Holder Repositioned Yes 11/20/2011  9:34 AM  Cuff Pressure (cm H2O) 23 cm H2O 11/20/2011  4:40 AM    Evaluation  O2 sats: stable throughout Complications: No apparent complications Patient did tolerate procedure well. Bilateral Breath Sounds: Clear   Yes  Dairl Ponder Nannette 11/20/2011, 11:37 AM

## 2011-11-20 NOTE — Progress Notes (Signed)
PARENTERAL NUTRITION CONSULT NOTE - INITIAL  Pharmacy Consult for TNA Indication: Sigmoid rupture/Colostomy  Allergies  Allergen Reactions  . Aspirin   . Ibuprofen   . Iron Dextran     REACTION: itching after test dose  . Metoclopramide Hcl     REACTION: chomping mouth  . Sulfonamide Derivatives    Patient Measurements: Height: 5\' 3"  (160 cm) Weight: 183 lb 10.3 oz (83.3 kg) IBW/kg (Calculated) : 52.4  Adjusted Body Weight: 62kg Usual Weight: 74kg  Vital Signs: Temp: 99.1 F (37.3 C) (06/02 0820) Temp src: Axillary (06/02 0400) BP: 121/46 mmHg (06/02 0900) Pulse Rate: 89  (06/02 0830) Intake/Output from previous day: 06/01 0701 - 06/02 0700 In: 5100 [I.V.:3650; IV Piggyback:1450] Out: 1250 [Urine:1000; Drains:150; Blood:100] Intake/Output from this shift: Total I/O In: 100 [I.V.:100] Out: 100 [Urine:100]  Labs:  Basename 11/20/11 0425 11/19/11 1625 11/17/11 1400  WBC 25.5* 14.8* 17.0*  HGB 10.0* 9.8* 9.7*  HCT 32.5* 30.7* 30.1*  PLT 314 267 330  APTT -- -- --  INR -- -- --     Basename 11/20/11 0425 11/19/11 1625 11/19/11 0405 11/18/11 0427  NA 132* 134* 134* --  K 4.5 3.3* 4.3 --  CL 100 100 96 --  CO2 25 27 28  --  GLUCOSE 141* 83 90 --  BUN 30* 37* 40* --  CREATININE 1.22* 1.37* 1.59* --  LABCREA -- -- -- --  CREAT24HRUR -- -- -- --  CALCIUM 8.0* 8.3* 9.0 --  MG -- 1.4* -- 1.6  PHOS -- 2.8 -- 3.8  PROT -- 4.5* -- --  ALBUMIN -- 2.0* -- --  AST -- 18 -- --  ALT -- 15 -- --  ALKPHOS -- 40 -- --  BILITOT -- 0.5 -- --  BILIDIR -- -- -- --  IBILI -- -- -- --  PREALBUMIN -- -- -- --  TRIG -- -- -- --  CHOLHDL -- -- -- --  CHOL -- -- -- --   Estimated Creatinine Clearance: 40.8 ml/min (by C-G formula based on Cr of 1.22).   No results found for this basename: GLUCAP:3 in the last 72 hours  Medical History: Past Medical History  Diagnosis Date  . Insomnia, unspecified   . Osteoarthrosis, unspecified whether generalized or localized,  unspecified site   . Unspecified hypothyroidism   . Unspecified essential hypertension   . Atrial fibrillation   . Sciatica   . Anemia   . COPD (chronic obstructive pulmonary disease)   . UTI (urinary tract infection)   . GERD (gastroesophageal reflux disease)   . Pneumonia   . Asthma   . Clostridium difficile colitis   . Diverticulitis of sigmoid colon 11/2010    with abscess/contained perforation  . Dementia     mild  . Dehydration with hyponatremia 12/2010    hospitalized  . Angiodysplasia of stomach     ablated 2010  . Angiodysplasia of colon     ablated 2010  . Vitamin B12 deficiency (dietary) anemia 04/12/2011  . Shingles   . Sarcoma     right hand  . Malignant neoplasm of breast (female), unspecified site     left  . Dupuytren's disease     left hand    Medications:  Scheduled:    . sodium chloride   Intravenous Once  . albuterol-ipratropium  6 puff Inhalation Q4H  . amiodarone  200 mg Oral BID  . antiseptic oral rinse  15 mL Mouth Rinse QID  . chlorhexidine  15 mL  Mouth Rinse BID  . DULoxetine  30 mg Oral BID  . furosemide  20 mg Intravenous Q12H  . heparin subcutaneous  5,000 Units Subcutaneous Q8H  . hydrocortisone sodium succinate  50 mg Intravenous Q6H  . levalbuterol  0.63 mg Nebulization Q6H  . magnesium sulfate 1 - 4 g bolus IVPB  2 g Intravenous Once  . pantoprazole (PROTONIX) IV  40 mg Intravenous Q24H  . piperacillin-tazobactam (ZOSYN)  IV  3.375 g Intravenous Q8H  . potassium chloride  10 mEq Intravenous Q1 Hr x 4  . sodium chloride  1,000 mL Intravenous Once  . sodium chloride  1,000 mL Intravenous Once  . DISCONTD: budesonide  0.25 mg Nebulization Q6H  . DISCONTD: HYDROmorphone PCA 0.3 mg/mL   Intravenous Q4H  . DISCONTD: ipratropium  0.5 mg Nebulization Q6H  . DISCONTD: levothyroxine  38 mcg Intravenous Daily  . DISCONTD: losartan  25 mg Oral Daily  . DISCONTD: methylPREDNISolone (SOLU-MEDROL) injection  20 mg Intravenous Daily  . DISCONTD:  traZODone  100 mg Per Tube QHS  . DISCONTD: zolpidem  5 mg Per Tube QHS    Insulin Requirements in the past 24 hours:  No insulin ordered prior to TNA  Current Nutrition:  NPO, no nutrition since admit 5/24  Assessment:  78 yo Female with perforated diverticular abscess; resection, abscess drainage 5/25. Underwent wound closure/mesh 6/2. Remains on vent post-op.  At risk for re-feeding syndrome with prolonged NPO.  Avoid volume overload with HF.  No history of DM, on Hydrocortisone IV, and chronic steroid use PTA for COPD.  Nutritional Goals:  Estimated Nutritional Needs: per RD 5/28 note Kcal: 4098-1191  Protein: 62.7-78.4 grams  Fluid: 1 ml per kcal intake   Clinimix formula: begin with E 5/15 at 70ml/hr Goal rate 70 ml/hr will deliver avg 1400 kCal, 84 grams of protein per day (1670 kcal/day with lipids, 1200 kcal/day without)  Lipids 20% on MWF, MVI/Trace elements MWF; due to national back order.  Plan: At 1800 today  Begin Clinimix E 5/15 at 47ml/hr  Add sensitive scale Novolog q6hr  TNA labs Mon/Thurs  Follow up daily.  Otho Bellows PharmD Pager [phone] 11/20/2011,10:51 AM

## 2011-11-20 NOTE — Progress Notes (Signed)
 CARDS Subjective:  She remains intubated post op wound dehis. Sleepy, husband in room.    Objective:  Vital Signs in the last 24 hours: Temp:  [97.4 F (36.3 C)-99.6 F (37.6 C)] 99.1 F (37.3 C) (06/02 0820) Pulse Rate:  [78-95] 89  (06/02 0830) Resp:  [14-21] 20  (06/02 0830) BP: (80-137)/(31-69) 106/41 mmHg (06/02 0830) SpO2:  [92 %-100 %] 97 % (06/02 0830) FiO2 (%):  [29.3 %-60 %] 29.7 % (06/02 0830) Weight:  [83.3 kg (183 lb 10.3 oz)] 83.3 kg (183 lb 10.3 oz) (06/02 0400)  Intake/Output from previous day: 06/01 0701 - 06/02 0700 In: 5100 [I.V.:3650; IV Piggyback:1450] Out: 1250 [Urine:1000; Drains:150; Blood:100]   Physical Exam: General: Ill appearing, on vent, sedate, responding to vocal commands. Lungs: Mild vent noise B. Heart: Normal S1 and S2.  Occasional ectopy No murmur, rubs or gallops.  Abdomen: soft, non-tender, positive bowel sounds. Extremities: No clubbing or cyanosis. Left hand edema noted this am after fluid resuscitation. Neurologic: Sedate   Lab Results:  Basename 11/20/11 0425 11/19/11 1625  WBC 25.5* 14.8*  HGB 10.0* 9.8*  PLT 314 267    Basename 11/20/11 0425 11/19/11 1625  NA 132* 134*  K 4.5 3.3*  CL 100 100  CO2 25 27  GLUCOSE 141* 83  BUN 30* 37*  CREATININE 1.22* 1.37*   No results found for this basename: TROPONINI:2,CK,MB:2 in the last 72 hours Hepatic Function Panel  Basename 11/19/11 1625  PROT 4.5*  ALBUMIN 2.0*  AST 18  ALT 15  ALKPHOS 40  BILITOT 0.5  BILIDIR --  IBILI --  Imaging: Dg Chest Port 1 View  11/19/2011  *RADIOLOGY REPORT*  Clinical Data: Status post intubation  PORTABLE CHEST - 1 VIEW  Comparison: 11/18/2011  Findings: Left chest wall pacer device is noted with lead in the right atrial appendage and right ventricle.  ET tube tip is above the carina.  There is a right IJ catheter with tip in the SVC.  Heart size is normal.  There are low lung volumes. Bibasilar opacities are unchanged from previous  exam.  IMPRESSION:  1.  Satisfactory position of the endotracheal tube with tip just above the carina.  Original Report Authenticated By: Rosealee Albee, M.D.    Telemetry: V paced, SR Personally viewed.   Cardiac Studies:  EF 30%  Assessment/Plan:  Active Problems:  HYPOTHYROIDISM  Atrial fibrillation  HEART FAILURE  History of hypertension  Respiratory failure following trauma and surgery  Chronic obstructive asthma  Perforated diverticulitis  Peritonitis  Hypotension   78 year old with wound dehiscence taken to OR 11/19/11 with EF 30%, cardiomyopathy, pacemaker, paroxysmal atrial fibrillation now in NSR, COPD, HTN, on Vent post op with COPD.  1. Acute systolic heart failure - BP decreased yesterday and IVF were given. Her main complaint when she is fluid overloaded is left hand edema which is present this am. She is + 3.7 liters yesterday. IV 20mg  BID lasix currently. Watch closely. Not on ACE/ARB (stopped Cozaar 25mg ) or Bb due to hypotension. Creat improved with IVF. CVP 7. Hopeful extubation today.  2. Pacemaker - functioning well  3. Paroxsysmal atrial fibrillation - NSR currently, amio 200 BID    Adolphus Hanf 11/20/2011, 9:36 AM

## 2011-11-20 NOTE — Progress Notes (Signed)
At 1310 25ml D50 IV given to patient for a CBG of 67. Rechecked in 15 minutes to be 102. Patient was asymtomatic and is stable.

## 2011-11-20 NOTE — Progress Notes (Signed)
FVC while intubated 0.8- MD Fienstein aware of FVC and ABG- order to extubate.

## 2011-11-21 ENCOUNTER — Encounter: Payer: Self-pay | Admitting: *Deleted

## 2011-11-21 ENCOUNTER — Inpatient Hospital Stay (HOSPITAL_COMMUNITY): Payer: Medicare Other

## 2011-11-21 ENCOUNTER — Encounter (HOSPITAL_COMMUNITY): Payer: Self-pay | Admitting: Surgery

## 2011-11-21 DIAGNOSIS — E46 Unspecified protein-calorie malnutrition: Secondary | ICD-10-CM

## 2011-11-21 LAB — COMPREHENSIVE METABOLIC PANEL
BUN: 24 mg/dL — ABNORMAL HIGH (ref 6–23)
CO2: 25 mEq/L (ref 19–32)
Calcium: 8.6 mg/dL (ref 8.4–10.5)
GFR calc Af Amer: 60 mL/min — ABNORMAL LOW (ref >90)
GFR calc non Af Amer: 52 mL/min — ABNORMAL LOW (ref >90)
Glucose, Bld: 144 mg/dL — ABNORMAL HIGH (ref 70–99)
Total Protein: 4.9 g/dL — ABNORMAL LOW (ref 6.0–8.3)

## 2011-11-21 LAB — CBC
MCH: 28.8 pg (ref 26.0–34.0)
Platelets: 296 10*3/uL (ref 150–400)
RBC: 3.12 MIL/uL — ABNORMAL LOW (ref 3.87–5.11)
RDW: 14.6 % (ref 11.5–15.5)
WBC: 21.4 10*3/uL — ABNORMAL HIGH (ref 4.0–10.5)

## 2011-11-21 LAB — GLUCOSE, CAPILLARY
Glucose-Capillary: 103 mg/dL — ABNORMAL HIGH (ref 70–99)
Glucose-Capillary: 130 mg/dL — ABNORMAL HIGH (ref 70–99)

## 2011-11-21 LAB — DIFFERENTIAL
Basophils Absolute: 0 10*3/uL (ref 0.0–0.1)
Eosinophils Absolute: 0 10*3/uL (ref 0.0–0.7)
Eosinophils Relative: 0 % (ref 0–5)
Lymphocytes Relative: 2 % — ABNORMAL LOW (ref 12–46)
Lymphs Abs: 0.3 10*3/uL — ABNORMAL LOW (ref 0.7–4.0)
Neutrophils Relative %: 94 % — ABNORMAL HIGH (ref 43–77)

## 2011-11-21 LAB — TRIGLYCERIDES: Triglycerides: 108 mg/dL

## 2011-11-21 LAB — MAGNESIUM: Magnesium: 2.1 mg/dL (ref 1.5–2.5)

## 2011-11-21 LAB — PHOSPHORUS: Phosphorus: 2 mg/dL — ABNORMAL LOW (ref 2.3–4.6)

## 2011-11-21 LAB — CHOLESTEROL, TOTAL: Cholesterol: 132 mg/dL (ref 0–200)

## 2011-11-21 MED ORDER — HYDROCORTISONE SOD SUCCINATE 100 MG IJ SOLR
50.0000 mg | Freq: Two times a day (BID) | INTRAMUSCULAR | Status: DC
  Administered 2011-11-21 – 2011-11-22 (×2): 50 mg via INTRAVENOUS
  Filled 2011-11-21: qty 2
  Filled 2011-11-21 (×2): qty 1

## 2011-11-21 MED ORDER — FUROSEMIDE 10 MG/ML IJ SOLN
40.0000 mg | Freq: Every day | INTRAMUSCULAR | Status: DC
  Administered 2011-11-21: 40 mg via INTRAVENOUS
  Filled 2011-11-21 (×2): qty 4

## 2011-11-21 MED ORDER — ACETAMINOPHEN 10 MG/ML IV SOLN
1000.0000 mg | Freq: Four times a day (QID) | INTRAVENOUS | Status: AC
  Administered 2011-11-21 – 2011-11-22 (×4): 1000 mg via INTRAVENOUS
  Filled 2011-11-21 (×5): qty 100

## 2011-11-21 MED ORDER — FAT EMULSION 20 % IV EMUL
250.0000 mL | INTRAVENOUS | Status: AC
  Administered 2011-11-21: 250 mL via INTRAVENOUS
  Filled 2011-11-21: qty 250

## 2011-11-21 MED ORDER — ZINC TRACE METAL 1 MG/ML IV SOLN
INTRAVENOUS | Status: AC
  Administered 2011-11-21: 17:00:00 via INTRAVENOUS
  Filled 2011-11-21: qty 1000

## 2011-11-21 MED ORDER — LEVOTHYROXINE SODIUM 100 MCG IV SOLR
25.0000 ug | Freq: Every day | INTRAVENOUS | Status: DC
  Administered 2011-11-21 – 2011-11-28 (×8): 26 ug via INTRAVENOUS
  Filled 2011-11-21 (×9): qty 1.3

## 2011-11-21 MED ORDER — SODIUM PHOSPHATE 3 MMOLE/ML IV SOLN
10.0000 mmol | Freq: Once | INTRAVENOUS | Status: AC
  Administered 2011-11-21: 10 mmol via INTRAVENOUS
  Filled 2011-11-21: qty 3.33

## 2011-11-21 NOTE — Progress Notes (Signed)
PARENTERAL NUTRITION CONSULT NOTE - INITIAL  Pharmacy Consult for TNA Indication: Sigmoid rupture/Colostomy  Allergies  Allergen Reactions  . Aspirin   . Ibuprofen   . Iron Dextran     REACTION: itching after test dose  . Metoclopramide Hcl     REACTION: chomping mouth  . Sulfonamide Derivatives    Patient Measurements: Height: 5\' 3"  (160 cm) Weight: 183 lb 10.3 oz (83.3 kg) IBW/kg (Calculated) : 52.4  Adjusted Body Weight: 62kg Usual Weight: 74kg  Vital Signs: Temp: 97.6 F (36.4 C) (06/03 0800) Temp src: Oral (06/03 0800) BP: 141/66 mmHg (06/03 0500) Pulse Rate: 87  (06/03 0700) Intake/Output from previous day: 06/02 0701 - 06/03 0700 In: 1152.5 [I.V.:495; IV Piggyback:137.5; TPN:520] Out: 1050 [Urine:1050] Intake/Output from this shift:    Labs:  [facility] 11/21/11 0415 11/20/11 1040 11/20/11 0425  WBC 21.4* 25.1* 25.5*  HGB 9.0* 9.7* 10.0*  HCT 28.6* 31.4* 32.5*  PLT 296 326 314  APTT -- -- --  INR -- -- --     Basename 11/21/11 0415 11/20/11 0425 11/19/11 1625  NA 135 132* 134*  K 3.9 4.5 3.3*  CL 102 100 100  CO2 25 25 27   GLUCOSE 144* 141* 83  BUN 24* 30* 37*  CREATININE 1.03 1.22* 1.37*  LABCREA -- -- --  CREAT24HRUR -- -- --  CALCIUM 8.6 8.0* 8.3*  MG 2.1 -- 1.4*  PHOS 2.0* -- 2.8  PROT 4.9* -- 4.5*  ALBUMIN 1.8* -- 2.0*  AST 12 -- 18  ALT 12 -- 15  ALKPHOS 48 -- 40  BILITOT 0.3 -- 0.5  BILIDIR -- -- --  IBILI -- -- --  PREALBUMIN -- -- --  TRIG 108 -- --  CHOLHDL -- -- --  CHOL 132 -- --   Estimated Creatinine Clearance: 48.3 ml/min (by C-G formula based on Cr of 1.03).    Basename 11/21/11 0044 11/20/11 1737 11/20/11 1336  GLUCAP 130* 125* 102*    Medical History: Past Medical History  Diagnosis Date  . Insomnia, unspecified   . Osteoarthrosis, unspecified whether generalized or localized, unspecified site   . Unspecified hypothyroidism   . Unspecified essential hypertension   . Atrial fibrillation   . Sciatica   .  Anemia   . COPD (chronic obstructive pulmonary disease)   . UTI (urinary tract infection)   . GERD (gastroesophageal reflux disease)   . Pneumonia   . Asthma   . Clostridium difficile colitis   . Diverticulitis of sigmoid colon 11/2010    with abscess/contained perforation  . Dementia     mild  . Dehydration with hyponatremia 12/2010    hospitalized  . Angiodysplasia of stomach     ablated 2010  . Angiodysplasia of colon     ablated 2010  . Vitamin B12 deficiency (dietary) anemia 04/12/2011  . Shingles   . Sarcoma     right hand  . Malignant neoplasm of breast (female), unspecified site     left  . Dupuytren's disease     left hand    Medications:  Scheduled:     . albuterol-ipratropium  2 puff Inhalation Q4H  . amiodarone  200 mg Oral BID  . antiseptic oral rinse  15 mL Mouth Rinse QID  . chlorhexidine  15 mL Mouth Rinse BID  . dextrose      . DULoxetine  30 mg Oral BID  . heparin subcutaneous  5,000 Units Subcutaneous Q8H  . hydrocortisone sodium succinate  50 mg Intravenous Q6H  .  insulin aspart  0-9 Units Subcutaneous Q6H  . pantoprazole (PROTONIX) IV  40 mg Intravenous Q24H  . piperacillin-tazobactam (ZOSYN)  IV  3.375 g Intravenous Q8H  . DISCONTD: albuterol-ipratropium  6 puff Inhalation Q4H  . DISCONTD: levalbuterol  0.63 mg Nebulization Q6H    Insulin Requirements in the past 24 hours:  CBG's 67-130 Sensitive SSI - 3 units   Current Nutrition:  NPO, no nutrition since admit 5/24  Assessment:  78 yo Female with perforated diverticular abscess; resection, abscess drainage 5/25. Underwent wound closure/mesh 6/2.   At risk for re-feeding syndrome with prolonged NPO.  Avoid volume overload with HF.  No history of DM, on Hydrocortisone IV, and chronic steroid use PTA for COPD.  Nutritional Goals:  Re-Estimated Nutritional Needs per RD 6/2 note: 1400-1500 Kcal and 75-85 grams protein/day.  TPN to meet > 90% estimated needs.   When current TNA at goal  rate 70 ml/hr will provide on average 1398 Kcal and 84 grams protein/day (1672 kcal/day with lipids, 1193 kcal/day without)  Electrolytes/Labs:  Sodium and Potassium WNL  Phos low 2.0  Magnesium WNL 1.5  AST/ALT WNL  Cholesterol and TG WNL  Pre-Albumin Pending  CBG's WNL   Plan: At 1800 today  Advance Clinimix E 5/15 to 55 ml/hr  Replace phos with Sodium Phosphate 10 mmol x 1 dose  Lipids 20% on MWF, MVI/Trace elements MWF; due to national back order.  Continue D5 1/2 NS with 20 KCL @ KVO  Continue sensitive slide scale  Follow-up pending Pre-Albumin  TNA labs Mon/Thurs  Jamie Hafford, Loma Messing PharmD 9:10 AM 11/21/2011 [phone]

## 2011-11-21 NOTE — Progress Notes (Signed)
Red seal from foley broken to irrigate catheter. Pt felt bloated and distended and UOP was less.  Urine seen in Foley tubing but would not drain.  Foley flushed with 50cc NS and urine returned. No further problems from Foley and pt now comfortable.

## 2011-11-21 NOTE — Progress Notes (Signed)
2 Days Post-Op  Subjective: Still extremely sore and anxious, can't even sit up by herself, having a fair amount of discomfort.  Objective: Vital signs in last 24 hours: Temp:  [97.6 F (36.4 C)-99.1 F (37.3 C)] 98.2 F (36.8 C) (06/03 1200) Pulse Rate:  [25-98] 88  (06/03 1400) Resp:  [15-25] 17  (06/03 1400) BP: (121-162)/(48-77) 140/61 mmHg (06/03 1400) SpO2:  [87 %-100 %] 98 % (06/03 1400) Last BM Date: 11/11/11  Intake/Output from previous day: 06/02 0701 - 06/03 0700 In: 1192.5 [I.V.:535; IV Piggyback:137.5; TPN:520] Out: 1050 [Urine:1050] Intake/Output this shift: Total I/O In: 625.8 [I.V.:100; IV Piggyback:285.8; TPN:240] Out: 500 [Urine:500]  General appearance: alert, cooperative and mild distress Resp: wheezes some mild wheezing GI: dressing intact, with Wound Vac and retention surtures, nothing thru the gastrostomy currently.  Still distended and very tender., no Bowel sounds  Lab Results:   Basename 11/21/11 0415 11/20/11 1040  WBC 21.4* 25.1*  HGB 9.0* 9.7*  HCT 28.6* 31.4*  PLT 296 326    BMET  Basename 11/21/11 0415 11/20/11 0425  NA 135 132*  K 3.9 4.5  CL 102 100  CO2 25 25  GLUCOSE 144* 141*  BUN 24* 30*  CREATININE 1.03 1.22*  CALCIUM 8.6 8.0*   PT/INR No results found for this basename: LABPROT:2,INR:2 in the last 72 hours   Lab 11/21/11 0415 11/19/11 1625  AST 12 18  ALT 12 15  ALKPHOS 48 40  BILITOT 0.3 0.5  PROT 4.9* 4.5*  ALBUMIN 1.8* 2.0*     Lipase  No results found for this basename: lipase     Studies/Results: Dg Chest Port 1 View  11/21/2011  *RADIOLOGY REPORT*  Clinical Data: Evaluate pulmonary edema.  COPD.  PORTABLE CHEST - 1 VIEW  Comparison: 11/19/2011  Findings: Right IJ central line unchanged. Pacer with leads at right atrium and right ventricle.  No lead discontinuity.  Numerous leads and wires project over the chest.  Normal heart size.  No pleural effusion or pneumothorax.  Mild subsegmental atelectasis at  the lung bases.  This is improved. Improved inspiratory effort.  Resolved pulmonary venous congestion. The chin overlies left apex. Interval extubation.  IMPRESSION: Improved aeration with decreased bibasilar subsegmental atelectasis and resolved pulmonary venous congestion.  Extubation.  Original Report Authenticated By: Consuello Bossier, M.D.   Dg Chest Port 1 View  11/19/2011  *RADIOLOGY REPORT*  Clinical Data: Status post intubation  PORTABLE CHEST - 1 VIEW  Comparison: 11/18/2011  Findings: Left chest wall pacer device is noted with lead in the right atrial appendage and right ventricle.  ET tube tip is above the carina.  There is a right IJ catheter with tip in the SVC.  Heart size is normal.  There are low lung volumes. Bibasilar opacities are unchanged from previous exam.  IMPRESSION:  1.  Satisfactory position of the endotracheal tube with tip just above the carina.  Original Report Authenticated By: Rosealee Albee, M.D.    Medications:    . albuterol-ipratropium  2 puff Inhalation Q4H  . amiodarone  200 mg Oral BID  . antiseptic oral rinse  15 mL Mouth Rinse QID  . chlorhexidine  15 mL Mouth Rinse BID  . DULoxetine  30 mg Oral BID  . furosemide  40 mg Intravenous Daily  . heparin subcutaneous  5,000 Units Subcutaneous Q8H  . hydrocortisone sodium succinate  50 mg Intravenous Q12H  . insulin aspart  0-9 Units Subcutaneous Q6H  . levothyroxine  26  mcg Intravenous QAC breakfast  . pantoprazole (PROTONIX) IV  40 mg Intravenous Q24H  . sodium phosphate  Dextrose 5% IVPB  10 mmol Intravenous Once  . DISCONTD: hydrocortisone sodium succinate  50 mg Intravenous Q6H  . DISCONTD: piperacillin-tazobactam (ZOSYN)  IV  3.375 g Intravenous Q8H    Assessment/Plan EXPLORATORY LAPAROTOMY, COLON RESECTION SIGMOID INCISION AND DRAINAGE ABSCESS for perforated diverticulitis - 11/12/2011 - Dr. Trude Mcburney Wound dehiscence - Closed with Strattice mesh and VAC - D. Newman - 11/19/2011 Atrial fibrillation -  seen by Dr. Burna Forts  On amiodarone, and chronic anticoagulation History of HEART FAILURE  EF 30-35% Chronic obstructive asthma, steroid dependant CCM/Medicine following HYPOTHYROIDISM On isolation for MRSA\ On TNA  For malnutrition  Plan:  I am going to do dressing tomorrow with wound nurse.  Continue foley for now she cannot move well enough to get up on Bed pan. LFT's OK add IV tylenol.   Call Tuesday when Wound vac nurse is available to do dressing.    LOS: 10 days    [name] 11/21/2011

## 2011-11-21 NOTE — Progress Notes (Addendum)
Name: [name] MRN: [id number] DOB: 01-09-1934    LOS: 10  Referring Provider:  EDP Reason for Referral:  shock  PULMONARY / CRITICAL CARE MEDICINE  BRIEF PROFILE:  62 yowf with steroid dep obstructive lung disease, CHF, CAF, divverticulitis adm by PCCM via [facility] am 5/24 with increased abd pain and hypotension. CT abd c/w free air and divertular abscess. Ex-lap 5/24 PM revealed perforated sigmoid diverticulitis. Underwent sigmoid colon resection and abscess drainage (Dr Biagio Quint). Remained intubated post op  SUBJECTIVE:  6/1- wound dehiscence, back to OR, post op resp failure  Vital Signs: Temp:  [97.6 F (36.4 C)-99.1 F (37.3 C)] 97.6 F (36.4 C) (06/03 0800) Pulse Rate:  [25-129] 79  (06/03 0900) Resp:  [15-25] 15  (06/03 0700) BP: (116-162)/(39-77) 162/68 mmHg (06/03 0900) SpO2:  [80 %-100 %] 100 % (06/03 0807) FiO2 (%):  [30 %] 30 % (06/02 1100) 4 liters  Physical Examination: HEENT: WNL, jvd, ETT Chest: coarse Heart: s1s2 no murmur Abd: No BS, wound vac, with open abd wound Ext: warm, no edema Neuro: no focal deficits, follows commands  Active Problems:  HYPOTHYROIDISM  Atrial fibrillation  HEART FAILURE  History of hypertension  Respiratory failure following trauma and surgery  Chronic obstructive asthma  Perforated diverticulitis  Peritonitis  Hypotension  Acute respiratory failure   ASSESSMENT AND PLAN  PULMONARY Acute respiratory Failure, post op 6/1 COPD/fixed asthma, (Xolair, Singulair, Pred (7.5) are chronic meds). plan Dg Chest Port 1 View  11/21/2011  *RADIOLOGY REPORT*  Clinical Data: Evaluate pulmonary edema.  COPD.  PORTABLE CHEST - 1 VIEW  Comparison: 11/19/2011  Findings: Right IJ central line unchanged. Pacer with leads at right atrium and right ventricle.  No lead discontinuity.  Numerous leads and wires project over the chest.  Normal heart size.  No pleural effusion or pneumothorax.  Mild subsegmental atelectasis at the lung bases.  This  is improved. Improved inspiratory effort.  Resolved pulmonary venous congestion. The chin overlies left apex. Interval extubation.  IMPRESSION: Improved aeration with decreased bibasilar subsegmental atelectasis and resolved pulmonary venous congestion.  Extubation.  Original Report Authenticated By: Consuello Bossier, M.D.   Dg Chest Port 1 View  11/19/2011  *RADIOLOGY REPORT*  Clinical Data: Status post intubation  PORTABLE CHEST - 1 VIEW  Comparison: 11/18/2011  Findings: Left chest wall pacer device is noted with lead in the right atrial appendage and right ventricle.  ET tube tip is above the carina.  There is a right IJ catheter with tip in the SVC.  Heart size is normal.  There are low lung volumes. Bibasilar opacities are unchanged from previous exam.  IMPRESSION:  1.  Satisfactory position of the endotracheal tube with tip just above the carina.  Original Report Authenticated By: Rosealee Albee, M.D.   -wean O2 as tolerated -pulmonary toilet  CARDIOVASCULAR Cardiology consult 5/24   A: Afib w/ RVR, chronic systolic CHF (EF 40-98%) P:  -Per cards -Amiodarone remain oral but npo due to gut. No hurry to start amio unless tachycardia an issue. Check with CCS about gut being available. Stress steroids remain, lower 6/3 , need to reduce when able with wound healing  RENAL  Lab 11/21/11 0415 11/20/11 0425 11/19/11 1625 11/19/11 0405 11/18/11 0427  NA 135 132* 134* 134* 134*  K 3.9 4.5 -- -- --  CL 102 100 100 96 95*  CO2 25 25 27 28 29   BUN 24* 30* 37* 40* 37*  CREATININE 1.03 1.22* 1.37* 1.59* 1.40*  CALCIUM 8.6 8.0* 8.3* 9.0 9.1  MG 2.1 -- 1.4* -- 1.6  PHOS 2.0* -- 2.8 -- 3.8   Intake/Output      06/02 0701 - 06/03 0700 06/03 0701 - 06/04 0700   I.V. (mL/kg) 535 (6.4) 40 (0.5)   IV Piggyback 137.5 32.5   TPN 520 80   Total Intake(mL/kg) 1192.5 (14.3) 152.5 (1.8)   Urine (mL/kg/hr) 1050 (0.5) 100   Drains 0    Blood     Total Output 1050 100   Net +142.5 +52.5           Intake/Output Summary (Last 24 hours) at 11/21/11 1125 Last data filed at 11/21/11 0900  Gross per 24 hour  Intake   1185 ml  Output   1050 ml  Net    135 ml   Foley:  In ER 5/24  A:  AKI / ATN, resolved P:   -CBC was hemoconcentration -Assess cvp, Q shift 6/3 -pcxr in am Even balance goal, reduction slight  GASTROINTESTINAL  Lab 11/21/11 0415 11/19/11 1625  AST 12 18  ALT 12 15  ALKPHOS 48 40  BILITOT 0.3 0.5  PROT 4.9* 4.5*  ALBUMIN 1.8* 2.0*   Diet : NPO  A:  Post op ileus - nutritional status an issue, tpn started -lft ppi  HEMATOLOGIC  Lab 11/21/11 0415 11/20/11 1040 11/20/11 0425 11/19/11 1625 11/17/11 1400  HGB 9.0* 9.7* 10.0* 9.8* 9.7*  HCT 28.6* 31.4* 32.5* 30.7* 30.1*  PLT 296 326 314 267 330  INR -- -- -- -- --  APTT -- -- -- -- --   A:  Coagulopathic from coumadin With no ostomy output unsure if I would start coumadin now  INFECTIOUS  Lab 11/21/11 0415 11/20/11 1040 11/20/11 0425 11/19/11 1625 11/17/11 1400 11/16/11 0605  WBC 21.4* 25.1* 25.5* 14.8* 17.0* --  PROCALCITON -- -- -- -- -- 1.73   Cultures: UC 5/24 >>15K mult orgs.  BC x2 5/24 >>neg  Antibiotics: Vancomycin  5/24 only Zosyn 5/24 >>  A:  Diverticular abscess due to perforated divertic/ low grade sepsis  6/1- wound dehiscence P:  Zosyn to add stop date June 2, may consider extension after events day prior. DC 6/3  ENDOCRINE  A:  Hypothyroidism Plan: -synthroid iv  added 6/3 and check tsh NEUROLOGIC  A:  Dementia dx pre op>>normal mental status 5/27, now reporting hallucinations at HS.   P:  -prn fent, versed Upright important, now abdo muscle issue  BEST PRACTICE / DISPOSITION - Level of Care: SDU, consider to floor if to oral amio - Primary Service:  PCCM - Consultants:  CCS/ gen surgery - Code Status:  Full - Diet:  See GI - DVT Px: PAS - GI Px:  PPI  Brett Canales Minor ACNP Adolph Pollack PCCM Pager [phone] till 3 pm If no answer page [phone] 11/21/2011, 10:05  AM  Patient seen and examined, agree with above note.  I dictated the care and orders written for this patient under my direction.  Koren Bound, M.D. [phone]

## 2011-11-21 NOTE — Progress Notes (Signed)
    Subjective:  The patient is feeling better. She still having some difficulty breathing because of pain with inspiration. She denies chest pain or pressure.  Objective:  Vital Signs in the last 24 hours: Temp:  [97.6 F (36.4 C)-99.1 F (37.3 C)] 97.6 F (36.4 C) (06/03 0800) Pulse Rate:  [25-129] 79  (06/03 0900) Resp:  [15-25] 15  (06/03 0700) BP: (116-162)/(39-77) 162/68 mmHg (06/03 0900) SpO2:  [80 %-100 %] 100 % (06/03 0807) FiO2 (%):  [30 %] 30 % (06/02 1100)  Intake/Output from previous day: 06/02 0701 - 06/03 0700 In: 1192.5 [I.V.:535; IV Piggyback:137.5; TPN:520] Out: 1050 [Urine:1050]  Physical Exam: Pt is alert and oriented, somewhat ill-appearing woman in NAD HEENT: normal Neck: JVP - mildly elevated Lungs: Clear anteriorly CV: RRR with grade 2/6 systolic murmur at the left sternal border Abd: soft, NT Ext: 1+ diffuse edema. Skin: warm/dry no rash   Lab Results:  Basename 11/21/11 0415 11/20/11 1040  WBC 21.4* 25.1*  HGB 9.0* 9.7*  PLT 296 326    Basename 11/21/11 0415 11/20/11 0425  NA 135 132*  K 3.9 4.5  CL 102 100  CO2 25 25  GLUCOSE 144* 141*  BUN 24* 30*  CREATININE 1.03 1.22*   No results found for this basename: TROPONINI:2,CK,MB:2 in the last 72 hours  Tele: Ventricular pacing  Assessment/Plan:  1. Acute on chronic systolic heart failure. The patient has known LV dysfunction with an ejection fraction in the range of 30-35%. She has required volume resuscitation and now has evidence of volume overload on exam. Would recommend gentle initiation of IV furosemide. Otherwise continue current medical program.  2. Atrial fibrillation. The patient will need to be resumed on warfarin when it is safe from a postoperative perspective.  [name], M.D. 11/21/2011, 10:36 AM

## 2011-11-21 NOTE — Progress Notes (Signed)
Patient interviewed and examined, agree with PA note above.  Mariella Saa MD, FACS  11/21/2011 7:55 PM

## 2011-11-21 NOTE — Progress Notes (Signed)
Physical Therapy Treatment Patient Details Name: [name] MRN: [id number] DOB: Mar 01, 1934 Today's Date: 11/21/2011 Time: 1000-1041 PT Time Calculation (min): 41 min  PT Assessment / Plan / Recommendation Comments on Treatment Session  77 yowf with steroid dep obstructive lung disease, CHF, CAF, divverticulitis adm by PCCM via [facility] am 5/24 with increased abd pain and hypotension. CT abd c/w free air and divertular abscess. Ex-lap 5/24 PM revealed perforated sigmoid diverticulitis. Underwent sigmoid colon resection and abscess drainage (Dr Biagio Quint). Remained intubated post op  6/1- wound dehiscence, back to OR, post op resp failure; pt requires much encouragement to participate with therapy    Follow Up Recommendations  Skilled nursing facility    Barriers to Discharge  decreased motivation      Equipment Recommendations       Recommendations for Other Services    Frequency Min 3X/week   Plan Discharge plan remains appropriate;Frequency remains appropriate    Precautions / Restrictions Precautions Precautions: Fall   Pertinent Vitals/Pain VSS; pt on 2L O2 C/o RLQ abdominal pain RN aware    Mobility  Bed Mobility Bed Mobility: Rolling Right Rolling Right: 4: Min assist Right Sidelying to Sit: 3: Mod assist Details for Bed Mobility Assistance: cues for sequence, technique; 2nd person to assist in managing lines Transfers Transfers: Sit to Stand;Stand to Sit Sit to Stand: 1: +2 Total assist;From bed Sit to Stand: Patient Percentage: 50% Stand to Sit: 1: +2 Total assist Stand to Sit: Patient Percentage: 50% Details for Transfer Assistance: verbal cues for self assist; 1 3/4stand ; 1 1/2 stand; pt refused OOB to chair; refused to use RW for assistance Ambulation/Gait Ambulation/Gait Assistance: Not tested (comment)    Exercises     PT Diagnosis:    PT Problem List:   PT Treatment Interventions:     PT Goals Acute Rehab PT Goals Time For Goal Achievement:  12/02/11 Potential to Achieve Goals: Fair Pt will go Supine/Side to Sit: with supervision PT Goal: Supine/Side to Sit - Progress: Progressing toward goal Pt will go Sit to Stand: with supervision PT Goal: Sit to Stand - Progress: Progressing toward goal Pt will go Stand to Sit: with supervision PT Goal: Stand to Sit - Progress: Progressing toward goal  Visit Information  Last PT Received On: 11/21/11 Assistance Needed: +2    Subjective Data  Subjective: I don't want to Patient Stated Goal: does not state   Cognition  Overall Cognitive Status: Appears within functional limits for tasks assessed/performed Arousal/Alertness: Awake/alert Orientation Level: Appears intact for tasks assessed Behavior During Session: [facility] for tasks performed    Balance  Static Sitting Balance Static Sitting - Balance Support: Right upper extremity supported;Feet supported Static Sitting - Level of Assistance: 5: Stand by assistance Static Sitting - Comment/# of Minutes: 7  End of Session PT - End of Session Activity Tolerance: Patient limited by fatigue;Patient limited by pain Patient left: in bed;with call bell/phone within reach;with family/visitor present Nurse Communication: Mobility status;Patient requests pain meds    [facility] 11/21/2011, 1:59 PM

## 2011-11-21 NOTE — Progress Notes (Signed)
Utilization review completed.  

## 2011-11-22 ENCOUNTER — Inpatient Hospital Stay (HOSPITAL_COMMUNITY): Payer: Medicare Other

## 2011-11-22 DIAGNOSIS — R1032 Left lower quadrant pain: Secondary | ICD-10-CM

## 2011-11-22 LAB — CBC
HCT: 27.7 % — ABNORMAL LOW (ref 36.0–46.0)
Hemoglobin: 8.7 g/dL — ABNORMAL LOW (ref 12.0–15.0)
MCV: 90.8 fL (ref 78.0–100.0)
RBC: 3.05 MIL/uL — ABNORMAL LOW (ref 3.87–5.11)
WBC: 19.2 10*3/uL — ABNORMAL HIGH (ref 4.0–10.5)

## 2011-11-22 LAB — BASIC METABOLIC PANEL
CO2: 27 mEq/L (ref 19–32)
Chloride: 101 mEq/L (ref 96–112)
Sodium: 136 mEq/L (ref 135–145)

## 2011-11-22 LAB — GLUCOSE, CAPILLARY
Glucose-Capillary: 138 mg/dL — ABNORMAL HIGH (ref 70–99)
Glucose-Capillary: 111 mg/dL — ABNORMAL HIGH (ref 70–99)

## 2011-11-22 MED ORDER — DIPHENHYDRAMINE HCL 50 MG/ML IJ SOLN
25.0000 mg | Freq: Once | INTRAMUSCULAR | Status: AC
Start: 2011-11-22 — End: 2011-11-23
  Administered 2011-11-23: 25 mg via INTRAVENOUS
  Filled 2011-11-22: qty 1

## 2011-11-22 MED ORDER — POTASSIUM CHLORIDE 10 MEQ/100ML IV SOLN
10.0000 meq | INTRAVENOUS | Status: AC
  Administered 2011-11-22 (×6): 10 meq via INTRAVENOUS
  Filled 2011-11-22 (×6): qty 100

## 2011-11-22 MED ORDER — HYDROCORTISONE SOD SUCCINATE 100 MG IJ SOLR
25.0000 mg | Freq: Two times a day (BID) | INTRAMUSCULAR | Status: DC
  Administered 2011-11-22 – 2011-11-28 (×12): 25 mg via INTRAVENOUS
  Filled 2011-11-22 (×12): qty 0.5

## 2011-11-22 MED ORDER — FUROSEMIDE 10 MG/ML IJ SOLN
40.0000 mg | Freq: Two times a day (BID) | INTRAMUSCULAR | Status: DC
  Administered 2011-11-22 – 2011-11-28 (×12): 40 mg via INTRAVENOUS
  Filled 2011-11-22 (×14): qty 4

## 2011-11-22 MED ORDER — DEXTROSE 10 % IV SOLN
INTRAVENOUS | Status: DC
  Administered 2011-11-22: 12:00:00 via INTRAVENOUS

## 2011-11-22 MED ORDER — CLINIMIX E/DEXTROSE (5/15) 5 % IV SOLN
INTRAVENOUS | Status: AC
  Administered 2011-11-22: 17:00:00 via INTRAVENOUS
  Filled 2011-11-22: qty 2000

## 2011-11-22 NOTE — Consult Note (Signed)
WOC consult Note Reason for Consult:wound care to open abdominal wound with NPWT. Dressing changed with ICU Staff RN and Zola Button, PA Wound type:dehisced surgical wound repaired with biologic mesh Pressure Ulcer POA: No Measurement: 26cm x 9cm x 4cm with proximal area of exposed biologic mesh measuring 10cm x 7cm .  Distal portion of wound with 3 retention sutures. Wound bed: Biologic mesh is white and textured in appearance (consistent with product), distal portion of wound is red, moist Drainage (amount, consistency, odor) scant amount of serosanguinous material in cannister Periwound:intact, small serum-filled blisters noted near retention sutures Dressing procedure/placement/frequency:NPWT ([employer] V.A.C) at 125 mmHg.  Noted that original order is for  100 mmHg, but settings have been 125 mmHg since OR.  Will is to contact surgery for clarification.  White foam placed over mesh and topped with black GranuFoam, black Granufoam fed beneath retention sutures.  Retention sutures covered with folded 4x4 gauze.  Drape applied and seal achieved without difficulty.  Patient tolerated procedure well with premedication and support from all staff as well as spouse. Plan:  Change NPWT on T-TH-Sat schedule at this time. I will follow with you and assist with NPWT dressing changes as able. Note:  No activity from LLQ Colostomy at this time. Thanks, Ladona Mow, MSN, RN, [facility], CWOCN [phone])

## 2011-11-22 NOTE — Progress Notes (Signed)
Name: [name] MRN: [id number] DOB: [birth year]    LOS: 11  Referring Provider:  EDP Reason for Referral:  shock  PULMONARY / CRITICAL CARE MEDICINE  BRIEF PROFILE:  89 yowf with steroid dep obstructive lung disease, CHF, CAF, divverticulitis adm by PCCM via [facility] am 5/24 with increased abd pain and hypotension. CT abd c/w free air and divertular abscess. Ex-lap 5/24 PM revealed perforated sigmoid diverticulitis. Underwent sigmoid colon resection and abscess drainage (Dr Biagio Quint). Remained intubated post op  SUBJECTIVE:  6/1- wound dehiscence, back to OR, post op resp failure 6/4 wants to remain in bed for feeling bad.  Vital Signs: Temp:  [97.5 F (36.4 C)-98.6 F (37 C)] 98.3 F (36.8 C) (06/04 0800) Pulse Rate:  [76-101] 80  (06/04 0600) Resp:  [15-23] 16  (06/04 0600) BP: (136-162)/(52-73) 158/58 mmHg (06/04 0600) SpO2:  [96 %-100 %] 97 % (06/04 0600) Weight:  [183 lb 13.8 oz (83.4 kg)] 183 lb 13.8 oz (83.4 kg) (06/04 0500) 4 liters  Physical Examination: HEENT: WNL, jvd, ETT Chest: coarse. +congested cough Heart: s1s2 no murmur Abd: No BS, wound vac, with open abd wound, colostomy with scant drainage Ext: warm, no edema Neuro: no focal deficits, follows commands  Active Problems:  HYPOTHYROIDISM  Atrial fibrillation  HEART FAILURE  History of hypertension  Respiratory failure following trauma and surgery  Chronic obstructive asthma  Perforated diverticulitis  Peritonitis  Hypotension  Acute respiratory failure   ASSESSMENT AND PLAN  PULMONARY Acute respiratory Failure, post op 6/1 COPD/fixed asthma, (Xolair, Singulair, Pred (7.5) are chronic meds). plan Dg Chest Port 1 View  11/22/2011  *RADIOLOGY REPORT*  Clinical Data: Respiratory distress.  PORTABLE CHEST - 1 VIEW  Comparison: 11/21/2011.  Findings: The pacer wires and right IJ catheter are stable.  The heart is normal in size.  Prominent mediastinal and hilar contours are stable.  Slightly lower lung  volumes with increase and vascular congestion and atelectasis.  No edema or pneumothorax.  IMPRESSION: Slightly lower lung volumes but otherwise stable chest x-ray.  Original Report Authenticated By: P. Loralie Champagne, M.D.   Dg Chest Port 1 View  11/21/2011  *RADIOLOGY REPORT*  Clinical Data: Evaluate pulmonary edema.  COPD.  PORTABLE CHEST - 1 VIEW  Comparison: 11/19/2011  Findings: Right IJ central line unchanged. Pacer with leads at right atrium and right ventricle.  No lead discontinuity.  Numerous leads and wires project over the chest.  Normal heart size.  No pleural effusion or pneumothorax.  Mild subsegmental atelectasis at the lung bases.  This is improved. Improved inspiratory effort.  Resolved pulmonary venous congestion. The chin overlies left apex. Interval extubation.  IMPRESSION: Improved aeration with decreased bibasilar subsegmental atelectasis and resolved pulmonary venous congestion.  Extubation.  Original Report Authenticated By: Consuello Bossier, M.D.   -wean O2 as tolerated -pulmonary toilet -solu cortef decreased 6/3  CARDIOVASCULAR Cardiology consult 5/24   A: Afib w/ RVR, chronic systolic CHF (EF 40-98%) P:  -Per cards -Amiodarone remain oral but npo due to gut. No hurry to start amio unless tachycardia an issue. Check with [facility] about gut being available. Not available on 6/4 Stress steroids remain, lower 6/3 , need to reduce when able with wound healing but patient is chronically on steroids.  Already at 50 q12 of hydrocortisone, will decrease to 25 q 12.  RENAL  Lab 11/22/11 0510 11/21/11 0415 11/20/11 0425 11/19/11 1625 11/19/11 0405 11/18/11 0427  NA 136 135 132* 134* 134* --  K 3.1* 3.9 -- -- -- --  CL 101 102 100 100 96 --  CO2 27 25 25 27 28  --  BUN 25* 24* 30* 37* 40* --  CREATININE 0.91 1.03 1.22* 1.37* 1.59* --  CALCIUM 8.6 8.6 8.0* 8.3* 9.0 --  MG -- 2.1 -- 1.4* -- 1.6  PHOS -- 2.0* -- 2.8 -- 3.8   Intake/Output      06/03 0701 - 06/04 0700 06/04 0701  - 06/05 0700   I.V. (mL/kg) 480 (5.8)    IV Piggyback 585.8    TPN 1252.1    Total Intake(mL/kg) 2317.9 (27.8)    Urine (mL/kg/hr) 2600 (1.3)    Drains 300    Stool 30    Total Output 2930    Net -612.1           Intake/Output Summary (Last 24 hours) at 11/22/11 0836 Last data filed at 11/22/11 0700  Gross per 24 hour  Intake 2245.41 ml  Output   2930 ml  Net -684.59 ml   Foley:  In ER 5/24  A:  AKI / ATN, resolved P:   -CBC was hemoconcentration -Assess cvp, Q shift 6/3 Even balance goal, reduction slight  GASTROINTESTINAL  Lab 11/21/11 0415 11/19/11 1625  AST 12 18  ALT 12 15  ALKPHOS 48 40  BILITOT 0.3 0.5  PROT 4.9* 4.5*  ALBUMIN 1.8* 2.0*   Diet : NPO  A:  Post op ileus - nutritional status an issue, tpn started -lft ppi  HEMATOLOGIC  Lab 11/22/11 0510 11/21/11 0415 11/20/11 1040 11/20/11 0425 11/19/11 1625  HGB 8.7* 9.0* 9.7* 10.0* 9.8*  HCT 27.7* 28.6* 31.4* 32.5* 30.7*  PLT 262 296 326 314 267  INR -- -- -- -- --  APTT -- -- -- -- --   A:  Coagulopathic from coumadin With no ostomy output unsure if I would start coumadin now  INFECTIOUS  Lab 11/22/11 0510 11/21/11 0415 11/20/11 1040 11/20/11 0425 11/19/11 1625 11/16/11 0605  WBC 19.2* 21.4* 25.1* 25.5* 14.8* --  PROCALCITON -- -- -- -- -- 1.73   Cultures: UC 5/24 >>15K mult orgs.  BC x2 5/24 >>neg  Antibiotics: Vancomycin  5/24 only Zosyn 5/24 >>6/3  A:  Diverticular abscess due to perforated divertic/ low grade sepsis  6/1- wound dehiscence P:  Zosyn to add stop date June 2, may consider extension after events day prior. DC 6/3  ENDOCRINE  A:  Hypothyroidism Plan: -synthroid iv  added 6/3 and check tsh(1.017) NEUROLOGIC  A:  Dementia dx pre op>>normal mental status 5/27. Alert and coperative 6/4  P:  -prn fent, versed Upright important, now abdo muscle issue  Brett Canales Minor ACNP Adolph Pollack PCCM Pager [phone] till 3 pm If no answer page [phone] 11/22/2011, 8:36  AM  Decrease hydrocortisone to 25 q12 but would not drop any further.  Keep fluid as even as possible to avoid tissue edema that can impair wound healing.  PT/OT and monitoring for now.  Awaiting SNF/LTAC placement, CSW is working on that.  Will not D/C until ok with surgery.  Patient seen and examined, agree with above note.  I dictated the care and orders written for this patient under my direction.  Koren Bound, M.D. [phone]

## 2011-11-22 NOTE — Progress Notes (Signed)
Patient ID: [name], female   DOB: 04-20-1934, 78 y.o.   MRN: [id number] 3 Days Post-Op  Subjective: Feels a little better today.  Rested well last night.   Mild nausea on occasion.  Good pain control with IV Tylenol.  No ostomy output  Objective: Vital signs in last 24 hours: Temp:  [97.5 F (36.4 C)-98.6 F (37 C)] 98.3 F (36.8 C) (06/04 0800) Pulse Rate:  [76-101] 77  (06/04 0830) Resp:  [15-23] 19  (06/04 0830) BP: (136-158)/(52-73) 152/57 mmHg (06/04 0830) SpO2:  [96 %-100 %] 98 % (06/04 0930) Weight:  [183 lb 13.8 oz (83.4 kg)] 183 lb 13.8 oz (83.4 kg) (06/04 0500) Last BM Date: 11/11/11  Intake/Output from previous day: 06/03 0701 - 06/04 0700 In: 2317.9 [I.V.:480; IV Piggyback:585.8; TPN:1252.1] Out: 2930 [Urine:2600; Drains:300; Stool:30] Intake/Output this shift: Total I/O In: 455 [I.V.:60; IV Piggyback:200; TPN:195] Out: 250 [Urine:250]  General appearance: alert and no distress GI: abnormal findings:  distended and mild tenderness in the entire abdomen Incision/Wound: VAC inplace and intact  Lab Results:   Basename 11/22/11 0510 11/21/11 0415  WBC 19.2* 21.4*  HGB 8.7* 9.0*  HCT 27.7* 28.6*  PLT 262 296   BMET  Basename 11/22/11 0510 11/21/11 0415  NA 136 135  K 3.1* 3.9  CL 101 102  CO2 27 25  GLUCOSE 135* 144*  BUN 25* 24*  CREATININE 0.91 1.03  CALCIUM 8.6 8.6     Studies/Results: Dg Chest Port 1 View  11/22/2011  *RADIOLOGY REPORT*  Clinical Data: Respiratory distress.  PORTABLE CHEST - 1 VIEW  Comparison: 11/21/2011.  Findings: The pacer wires and right IJ catheter are stable.  The heart is normal in size.  Prominent mediastinal and hilar contours are stable.  Slightly lower lung volumes with increase and vascular congestion and atelectasis.  No edema or pneumothorax.  IMPRESSION: Slightly lower lung volumes but otherwise stable chest x-ray.  Original Report Authenticated By: P. Loralie Champagne, M.D.   Dg Chest Port 1 View  11/21/2011   *RADIOLOGY REPORT*  Clinical Data: Evaluate pulmonary edema.  COPD.  PORTABLE CHEST - 1 VIEW  Comparison: 11/19/2011  Findings: Right IJ central line unchanged. Pacer with leads at right atrium and right ventricle.  No lead discontinuity.  Numerous leads and wires project over the chest.  Normal heart size.  No pleural effusion or pneumothorax.  Mild subsegmental atelectasis at the lung bases.  This is improved. Improved inspiratory effort.  Resolved pulmonary venous congestion. The chin overlies left apex. Interval extubation.  IMPRESSION: Improved aeration with decreased bibasilar subsegmental atelectasis and resolved pulmonary venous congestion.  Extubation.  Original Report Authenticated By: Consuello Bossier, M.D.    Anti-infectives: Anti-infectives     Start     Dose/Rate Route Frequency Ordered Stop   11/11/11 2000   piperacillin-tazobactam (ZOSYN) IVPB 3.375 g  Status:  Discontinued        3.375 g 12.5 mL/hr over 240 Minutes Intravenous Every 8 hours 11/11/11 1928 11/21/11 1017   11/11/11 1130  piperacillin-tazobactam (ZOSYN) IVPB 3.375 g       3.375 g 100 mL/hr over 30 Minutes Intravenous  Once 11/11/11 1050 11/11/11 1200   11/11/11 1130   vancomycin (VANCOCIN) IVPB 1000 mg/200 mL premix        1,000 mg 200 mL/hr over 60 Minutes Intravenous  Once 11/11/11 1050 11/11/11 1341          Assessment/Plan: s/p Procedure(s): EXPLORATORY LAPAROTOMY GASTROSTOMY INSERTION OF MESH APPLICATION OF  WOUND VAC Stable post op.  Continue current Rx. For VAC change today. OOB   LOS: 11 days    [name] T 11/22/2011

## 2011-11-22 NOTE — Progress Notes (Signed)
    Subjective:  No chest pain. Breathing a little better today. Feels she is unable to get up out of bed even with assistance.  Objective:  Vital Signs in the last 24 hours: Temp:  [97.5 F (36.4 C)-98.6 F (37 C)] 98.6 F (37 C) (06/04 0400) Pulse Rate:  [76-101] 80  (06/04 0600) Resp:  [15-23] 16  (06/04 0600) BP: (136-162)/(52-73) 158/58 mmHg (06/04 0600) SpO2:  [96 %-100 %] 97 % (06/04 0600) Weight:  [83.4 kg (183 lb 13.8 oz)] 83.4 kg (183 lb 13.8 oz) (06/04 0500)  Intake/Output from previous day: 06/03 0701 - 06/04 0700 In: 2317.9 [I.V.:480; IV Piggyback:585.8; TPN:1252.1] Out: 2930 [Urine:2600; Drains:300; Stool:30]  Physical Exam: Pt is alert and oriented, chronically-ill appearing woman in NAD HEENT: normal Neck: JVP - normal Lungs: diffuse rhonchi CV: irregular without murmur Abd: soft, NT Ext: left hand swelling noted, minimal leg edema Skin: warm/dry no rash  Lab Results:  Basename 11/22/11 0510 11/21/11 0415  WBC 19.2* 21.4*  HGB 8.7* 9.0*  PLT 262 296    Basename 11/22/11 0510 11/21/11 0415  NA 136 135  K 3.1* 3.9  CL 101 102  CO2 27 25  GLUCOSE 135* 144*  BUN 25* 24*  CREATININE 0.91 1.03   No results found for this basename: TROPONINI:2,CK,MB:2 in the last 72 hours  Tele: V-paced  Assessment/Plan:  1. Atrial fibrillation - on oral amiodarone for rate-control. IV amio was started preoperatively when she was acutely ill and I would not anticipate this to be a long-term drug for her. I am hesitant to restart her beta-blocker with her current lung exam, but she has been on ToprolXL in the past. Resume when ok with pulmonary team. Resume warfarin when safe from post-op perspective.  2. Acute on chronic systolic heart failure - required aggressive volume resuscitation in setting of acute illness. Needs diuresis. Increase furosemide to 40 mg IV bid and follow lytes and renal function closely.  3. Hypokalemia - replete K.  [name],  M.D. 11/22/2011, 7:56 AM

## 2011-11-22 NOTE — Clinical Social Work Note (Signed)
CSW met with Pt and her son, Ethelene Browns to offer support and check in. Pt discouraged and anxious about her condition. She has much hesitation to attempt to move. She feels getting OOB last time resulted in her having the second surgery. Pt has h/o anxiety and depression per her report and can't take meds for that currently. CSW discussed some coping techniques and things to focus on currently. Pt agreed to try to consider these. CSW inquired about visit from therapy dog, but Pt declined due to her asthma.  Pt is agreeable to SNF, but would benefit more from LTAC. RN CM aware and will discuss with the team.  CSW will follow.  Vennie Homans, [place] 11/22/2011 12:06 PM [phone]

## 2011-11-22 NOTE — Progress Notes (Signed)
OT Note:  Pt was agreeable to getting OOB with Kandee Keen but was trying to coordinate after dressing change.  Will reattempt as schedule permits.  [place],  [phone] 11/22/2011

## 2011-11-22 NOTE — Progress Notes (Signed)
PARENTERAL NUTRITION CONSULT NOTE - INITIAL  Pharmacy Consult for TNA Indication: Sigmoid rupture/Colostomy  Allergies  Allergen Reactions  . Aspirin   . Ibuprofen   . Iron Dextran     REACTION: itching after test dose  . Metoclopramide Hcl     REACTION: chomping mouth  . Sulfonamide Derivatives    Patient Measurements: Height: 5\' 3"  (160 cm) Weight: 183 lb 13.8 oz (83.4 kg) IBW/kg (Calculated) : 52.4  Adjusted Body Weight: 62kg Usual Weight: 74kg  Vital Signs: Temp: 98.3 F (36.8 C) (06/04 0800) Temp src: Oral (06/04 0800) BP: 158/58 mmHg (06/04 0600) Pulse Rate: 80  (06/04 0600) Intake/Output from previous day: 06/03 0701 - 06/04 0700 In: 2317.9 [I.V.:480; IV Piggyback:585.8; TPN:1252.1] Out: 2930 [Urine:2600; Drains:300; Stool:30] Intake/Output from this shift:    Labs:  [facility] 11/22/11 0510 11/21/11 0415 11/20/11 1040  WBC 19.2* 21.4* 25.1*  HGB 8.7* 9.0* 9.7*  HCT 27.7* 28.6* 31.4*  PLT 262 296 326  APTT -- -- --  INR -- -- --     Basename 11/22/11 0510 11/21/11 0415 11/20/11 0425 11/19/11 1625  NA 136 135 132* --  K 3.1* 3.9 4.5 --  CL 101 102 100 --  CO2 27 25 25  --  GLUCOSE 135* 144* 141* --  BUN 25* 24* 30* --  CREATININE 0.91 1.03 1.22* --  LABCREA -- -- -- --  CREAT24HRUR -- -- -- --  CALCIUM 8.6 8.6 8.0* --  MG -- 2.1 -- 1.4*  PHOS -- 2.0* -- 2.8  PROT -- 4.9* -- 4.5*  ALBUMIN -- 1.8* -- 2.0*  AST -- 12 -- 18  ALT -- 12 -- 15  ALKPHOS -- 48 -- 40  BILITOT -- 0.3 -- 0.5  BILIDIR -- -- -- --  IBILI -- -- -- --  PREALBUMIN -- 10.4* -- --  TRIG -- 108 -- --  CHOLHDL -- -- -- --  CHOL -- 132 -- --   Estimated Creatinine Clearance: 54.6 ml/min (by C-G formula based on Cr of 0.91).    Basename 11/22/11 0742 11/22/11 0458 11/21/11 2329  GLUCAP 111* 139* 103*    Medical History: Past Medical History  Diagnosis Date  . Insomnia, unspecified   . Osteoarthrosis, unspecified whether generalized or localized, unspecified site   .  Unspecified hypothyroidism   . Unspecified essential hypertension   . Atrial fibrillation   . Sciatica   . Anemia   . COPD (chronic obstructive pulmonary disease)   . UTI (urinary tract infection)   . GERD (gastroesophageal reflux disease)   . Pneumonia   . Asthma   . Clostridium difficile colitis   . Diverticulitis of sigmoid colon 11/2010    with abscess/contained perforation  . Dementia     mild  . Dehydration with hyponatremia 12/2010    hospitalized  . Angiodysplasia of stomach     ablated 2010  . Angiodysplasia of colon     ablated 2010  . Vitamin B12 deficiency (dietary) anemia 04/12/2011  . Shingles   . Sarcoma     right hand  . Malignant neoplasm of breast (female), unspecified site     left  . Dupuytren's disease     left hand    Medications:  Scheduled:     . acetaminophen  1,000 mg Intravenous Q6H  . albuterol-ipratropium  2 puff Inhalation Q4H  . amiodarone  200 mg Oral BID  . antiseptic oral rinse  15 mL Mouth Rinse QID  . chlorhexidine  15 mL Mouth  Rinse BID  . DULoxetine  30 mg Oral BID  . furosemide  40 mg Intravenous BID  . heparin subcutaneous  5,000 Units Subcutaneous Q8H  . hydrocortisone sodium succinate  50 mg Intravenous Q12H  . insulin aspart  0-9 Units Subcutaneous Q6H  . levothyroxine  26 mcg Intravenous QAC breakfast  . pantoprazole (PROTONIX) IV  40 mg Intravenous Q24H  . potassium chloride  10 mEq Intravenous Q1 Hr x 6  . sodium phosphate  Dextrose 5% IVPB  10 mmol Intravenous Once  . DISCONTD: furosemide  40 mg Intravenous Daily  . DISCONTD: hydrocortisone sodium succinate  50 mg Intravenous Q6H  . DISCONTD: piperacillin-tazobactam (ZOSYN)  IV  3.375 g Intravenous Q8H    Insulin Requirements in the past 24 hours:  CBG's 93-139 Sensitive SSI - 1 units   Current Nutrition:  NPO, no nutrition since admit 5/24  Assessment:  78 yo Female with perforated diverticular abscess; resection, abscess drainage 5/25. Underwent wound  closure/mesh 6/2.   At risk for re-feeding syndrome with prolonged NPO.  Avoid volume overload with HF.  No history of DM, on Hydrocortisone IV, and chronic steroid use PTA for COPD.  Nutritional Goals:  Re-Estimated Nutritional Needs per RD 6/2 note: 1400-1500 Kcal and 75-85 grams protein/day.  TPN to meet > 90% estimated needs.   When current TNA at goal rate 70 ml/hr will provide on average 1398 Kcal and 84 grams protein/day (1672 kcal/day with lipids, 1193 kcal/day without)  Electrolytes/Labs:  Sodium WNL  Potassium low 3.1, supplement ordered by MD  Magnesium WNL  Phosphorus WNL  AST/ALT WNL on 6/3  Cholesterol and TG WNL on 6/3  Pre-Albumin 10.4 on 6/3  CBG's WNL   Plan: At 1800 today  Advance Clinimix E 5/15 to goal rate 70 ml/hr  KCL 60 mEq IV ordered by MD  Lipids 20% on MWF, MVI/Trace elements MWF; due to national back order.  Continue D5 1/2 NS with 20 KCL @ KVO  Continue sensitive slide scale  TNA labs Mon/Thurs  Clydene Fake PharmD 8:44 AM 11/22/2011 Pager: [phone]

## 2011-11-23 ENCOUNTER — Inpatient Hospital Stay (HOSPITAL_COMMUNITY): Payer: Medicare Other

## 2011-11-23 DIAGNOSIS — I5023 Acute on chronic systolic (congestive) heart failure: Secondary | ICD-10-CM

## 2011-11-23 DIAGNOSIS — I4891 Unspecified atrial fibrillation: Secondary | ICD-10-CM

## 2011-11-23 LAB — GLUCOSE, CAPILLARY
Glucose-Capillary: 105 mg/dL — ABNORMAL HIGH (ref 70–99)
Glucose-Capillary: 99 mg/dL (ref 70–99)

## 2011-11-23 LAB — BASIC METABOLIC PANEL
BUN: 24 mg/dL — ABNORMAL HIGH (ref 6–23)
CO2: 28 mEq/L (ref 19–32)
Calcium: 8.8 mg/dL (ref 8.4–10.5)
Chloride: 98 mEq/L (ref 96–112)
Creatinine, Ser: 0.81 mg/dL (ref 0.50–1.10)
Glucose, Bld: 119 mg/dL — ABNORMAL HIGH (ref 70–99)

## 2011-11-23 LAB — CBC
HCT: 27.9 % — ABNORMAL LOW (ref 36.0–46.0)
MCH: 28.6 pg (ref 26.0–34.0)
MCHC: 31.5 g/dL (ref 30.0–36.0)
MCV: 90.6 fL (ref 78.0–100.0)
Platelets: 279 10*3/uL (ref 150–400)
RDW: 14.5 % (ref 11.5–15.5)

## 2011-11-23 MED ORDER — METOPROLOL TARTRATE 1 MG/ML IV SOLN
5.0000 mg | Freq: Four times a day (QID) | INTRAVENOUS | Status: DC
  Administered 2011-11-23 – 2011-11-28 (×21): 5 mg via INTRAVENOUS
  Filled 2011-11-23 (×26): qty 5

## 2011-11-23 MED ORDER — FAT EMULSION 20 % IV EMUL
240.0000 mL | INTRAVENOUS | Status: AC
  Administered 2011-11-23: 240 mL via INTRAVENOUS
  Filled 2011-11-23: qty 250

## 2011-11-23 MED ORDER — DIPHENHYDRAMINE HCL 25 MG PO CAPS: 25.0000 mg | ORAL_CAPSULE | Freq: Every evening | ORAL | Status: DC | PRN

## 2011-11-23 MED ORDER — POTASSIUM CHLORIDE 10 MEQ/100ML IV SOLN: 10.0000 meq | INTRAVENOUS | Status: DC

## 2011-11-23 MED ORDER — POTASSIUM CHLORIDE 10 MEQ/100ML IV SOLN
10.0000 meq | INTRAVENOUS | Status: DC
  Filled 2011-11-23: qty 100

## 2011-11-23 MED ORDER — ZINC TRACE METAL 1 MG/ML IV SOLN
INTRAVENOUS | Status: DC
  Filled 2011-11-23: qty 2000

## 2011-11-23 MED ORDER — MAGNESIUM SULFATE 40 MG/ML IJ SOLN
2.0000 g | Freq: Once | INTRAMUSCULAR | Status: AC
  Administered 2011-11-23: 2 g via INTRAVENOUS
  Filled 2011-11-23: qty 50

## 2011-11-23 MED ORDER — DIPHENHYDRAMINE HCL 50 MG/ML IJ SOLN
INTRAMUSCULAR | Status: AC
  Administered 2011-11-23: 25 mg via INTRAVENOUS
  Filled 2011-11-23: qty 1

## 2011-11-23 MED ORDER — POTASSIUM CHLORIDE 10 MEQ/50ML IV SOLN
10.0000 meq | INTRAVENOUS | Status: DC
  Administered 2011-11-23: 10 meq via INTRAVENOUS

## 2011-11-23 MED ORDER — FAT EMULSION 20 % IV EMUL
240.0000 mL | INTRAVENOUS | Status: DC
  Filled 2011-11-23: qty 250

## 2011-11-23 MED ORDER — POTASSIUM CHLORIDE 10 MEQ/50ML IV SOLN
INTRAVENOUS | Status: AC
  Filled 2011-11-23: qty 50

## 2011-11-23 MED ORDER — POTASSIUM CHLORIDE 10 MEQ/50ML IV SOLN
10.0000 meq | INTRAVENOUS | Status: AC
  Administered 2011-11-23 (×3): 10 meq via INTRAVENOUS
  Filled 2011-11-23 (×3): qty 50

## 2011-11-23 MED ORDER — SODIUM CHLORIDE 0.9 % IJ SOLN
10.0000 mL | Freq: Two times a day (BID) | INTRAMUSCULAR | Status: DC
  Administered 2011-11-23 – 2011-11-30 (×11): 10 mL

## 2011-11-23 MED ORDER — POTASSIUM CHLORIDE 10 MEQ/50ML IV SOLN
INTRAVENOUS | Status: AC
  Administered 2011-11-23: 10 meq
  Filled 2011-11-23: qty 50

## 2011-11-23 MED ORDER — ZINC TRACE METAL 1 MG/ML IV SOLN
INTRAVENOUS | Status: AC
  Administered 2011-11-23: 17:00:00 via INTRAVENOUS
  Filled 2011-11-23: qty 2000

## 2011-11-23 MED ORDER — SODIUM PHOSPHATE 3 MMOLE/ML IV SOLN
10.0000 mmol | Freq: Once | INTRAVENOUS | Status: AC
  Administered 2011-11-23: 10 mmol via INTRAVENOUS
  Filled 2011-11-23: qty 3.33

## 2011-11-23 MED ORDER — DIPHENHYDRAMINE HCL 50 MG/ML IJ SOLN
25.0000 mg | Freq: Once | INTRAMUSCULAR | Status: AC
  Administered 2011-11-23: 25 mg via INTRAVENOUS

## 2011-11-23 NOTE — Progress Notes (Signed)
Peripherally Inserted Central Catheter/Midline Placement  The IV Nurse has discussed with the patient and/or persons authorized to consent for the patient, the purpose of this procedure and the potential benefits and risks involved with this procedure.  The benefits include less needle sticks, lab draws from the catheter and patient may be discharged home with the catheter.  Risks include, but not limited to, infection, bleeding, blood clot (thrombus formation), and puncture of an artery; nerve damage and irregular heat beat.  Alternatives to this procedure were also discussed.  PICC/Midline Placement Documentation        [name] 11/23/2011, 2:42 PM

## 2011-11-23 NOTE — Progress Notes (Signed)
    Subjective:  No chest pain or dyspnea. Reports anxiety last night.  Objective:  Vital Signs in the last 24 hours: Temp:  [97.8 F (36.6 C)-98.7 F (37.1 C)] 98.7 F (37.1 C) (06/05 0000) Pulse Rate:  [77-90] 87  (06/05 0600) Resp:  [16-29] 18  (06/05 0600) BP: (141-166)/(57-90) 141/57 mmHg (06/05 0600) SpO2:  [93 %-100 %] 100 % (06/05 0600) Weight:  [82.3 kg (181 lb 7 oz)] 82.3 kg (181 lb 7 oz) (06/05 0400)  Intake/Output from previous day: 06/04 0701 - 06/05 0700 In: 2485 [I.V.:790; IV Piggyback:450; TPN:1245] Out: 2145 [Urine:2050; Drains:65; Stool:30]  Physical Exam: Pt is alert and oriented, NAD HEENT: normal Neck: JVP - normal Lungs: CTA bilaterally CV: RRR without murmur or gallop Abd: soft Ext: no C/C/E, distal pulses intact and equal Skin: warm/dry no rash   Lab Results:  Basename 11/23/11 0500 11/22/11 0510  WBC 18.0* 19.2*  HGB 8.8* 8.7*  PLT 279 262    Basename 11/23/11 0500 11/22/11 0510  NA 134* 136  K 3.3* 3.1*  CL 98 101  CO2 28 27  GLUCOSE 119* 135*  BUN 24* 25*  CREATININE 0.81 0.91   No results found for this basename: TROPONINI:2,CK,MB:2 in the last 72 hours  Tele: paced hr 80's  Assessment/Plan:  1. Atrial fibrillation. Heart rate controlled. She is NPO and can't take oral amiodarone. Will discontinue - this was started for rate control when she was acutely ill. Resume warfarin when taking PO. 2. Acute on chronic systolic heart failure. I/O's even on twice daily IV lasix. Start IV metoprolol. Resume ACE when she is taking PO. 3. Anxiety - consider anxiolytic - will defer to primary team. She has tolerated benzos well in the past.  [name], M.D. 11/23/2011, 7:30 AM

## 2011-11-23 NOTE — Progress Notes (Signed)
Patient interviewed and examined, agree with PA note above. Would not transfer to LTAC until wound is more stable. Mariella Saa MD, FACS  11/23/2011 8:01 PM

## 2011-11-23 NOTE — Progress Notes (Signed)
PT Cancellation Note  Treatment cancelled today due to patient receiving procedure or test.  Pt receiving PICC line and checked back an hour later and pt getting xray.  Will check back as schedule permits.  [name],KATHrine E 11/23/2011, 3:58 PM Pager: [phone]

## 2011-11-23 NOTE — Progress Notes (Signed)
PARENTERAL NUTRITION CONSULT NOTE  Pharmacy Consult for TNA Indication: Sigmoid rupture/Colostomy  Allergies  Allergen Reactions  . Aspirin   . Ibuprofen   . Iron Dextran     REACTION: itching after test dose  . Metoclopramide Hcl     REACTION: chomping mouth  . Sulfonamide Derivatives    Patient Measurements: Height: 5\' 3"  (160 cm) Weight: 181 lb 7 oz (82.3 kg) IBW/kg (Calculated) : 52.4  Adjusted Body Weight: 62kg Usual Weight: 74kg  Vital Signs: Temp: 97.9 F (36.6 C) (06/05 0800) Temp src: Oral (06/05 0800) BP: 141/57 mmHg (06/05 0600) Pulse Rate: 87  (06/05 0600) Intake/Output from previous day: 06/04 0701 - 06/05 0700 In: 2485 [I.V.:790; IV Piggyback:450; TPN:1245] Out: 2145 [Urine:2050; Drains:65; Stool:30]  Labs:  [facility] 11/23/11 0500 11/22/11 0510 11/21/11 0415  WBC 18.0* 19.2* 21.4*  HGB 8.8* 8.7* 9.0*  HCT 27.9* 27.7* 28.6*  PLT 279 262 296  APTT -- -- --  INR -- -- --     Basename 11/23/11 0500 11/22/11 0510 11/21/11 0415  NA 134* 136 135  K 3.3* 3.1* 3.9  CL 98 101 102  CO2 28 27 25   GLUCOSE 119* 135* 144*  BUN 24* 25* 24*  CREATININE 0.81 0.91 1.03  LABCREA -- -- --  CREAT24HRUR -- -- --  CALCIUM 8.8 8.6 8.6  MG 1.9 2.0 2.1  PHOS 2.2* 2.5 2.0*  PROT -- -- 4.9*  ALBUMIN -- -- 1.8*  AST -- -- 12  ALT -- -- 12  ALKPHOS -- -- 48  BILITOT -- -- 0.3  BILIDIR -- -- --  IBILI -- -- --  PREALBUMIN -- -- 10.4*  TRIG -- -- 108  CHOLHDL -- -- --  CHOL -- -- 132   Estimated Creatinine Clearance: 61 ml/min (by C-G formula based on Cr of 0.81).    Basename 11/23/11 0617 11/22/11 2329 11/22/11 1756  GLUCAP 106* 105* 99    Medications:  Scheduled:     . acetaminophen  1,000 mg Intravenous Q6H  . albuterol-ipratropium  2 puff Inhalation Q4H  . antiseptic oral rinse  15 mL Mouth Rinse QID  . chlorhexidine  15 mL Mouth Rinse BID  . diphenhydrAMINE  25 mg Intravenous Once  . DULoxetine  30 mg Oral BID  . furosemide  40 mg Intravenous  BID  . heparin subcutaneous  5,000 Units Subcutaneous Q8H  . hydrocortisone sodium succinate  25 mg Intravenous Q12H  . insulin aspart  0-9 Units Subcutaneous Q6H  . levothyroxine  26 mcg Intravenous QAC breakfast  . metoprolol  5 mg Intravenous Q6H  . pantoprazole (PROTONIX) IV  40 mg Intravenous Q24H  . potassium chloride  10 mEq Intravenous Q1 Hr x 6  . potassium chloride  10 mEq Intravenous Q1 Hr x 3  . potassium chloride      . DISCONTD: amiodarone  200 mg Oral BID  . DISCONTD: hydrocortisone sodium succinate  50 mg Intravenous Q12H  . DISCONTD: potassium chloride  10 mEq Intravenous Q1 Hr x 4  . DISCONTD: potassium chloride  10 mEq Intravenous Q1 Hr x 4  . DISCONTD: potassium chloride  10 mEq Intravenous Q1 Hr x 4  . DISCONTD: potassium chloride      . DISCONTD: potassium chloride      . DISCONTD: potassium chloride        Insulin Requirements in the past 24 hours:  CBG's 99-111 Sensitive SSI - none given   Current Nutrition:  NPO Clinimix 5/15 at 70  ml/hr + Lipids MWF IVF: D5 0.45NaCl 20KCl at [facility]  Assessment:  78 yo Female with perforated diverticular abscess; resection, abscess drainage 5/25. Underwent wound closure/mesh 6/2.   At risk for re-feeding syndrome with prolonged NPO.  Avoid volume overload with HF.  No history of DM, on Hydrocortisone IV, and chronic steroid use PTA for COPD.  Only 30mL output from colostomy documented on 6/4  Nutritional Goals:  Per RD 6/2 note: 1400-1500 Kcal and 75-85 grams protein/day.  TPN to meet > 90% estimated needs.    Clinimix 5/15 at goal rate 70 ml/hr will provide on average 1398 Kcal and 84 grams protein/day (1672 kcal/day with lipids, 1193 kcal/day without)  Electrolytes/Labs:  Na just below normal (unable to adjust in Clinimix product), K low and supplements ordered already, Mag WNL, Phosphorus low  AST/ALT WNL on 6/3  Cholesterol and TG WNL on 6/3  Pre-Albumin 10.4 on 6/3  CBG's WNL   Plan: At 1800  today  Continue Clinimix E 5/15 at goal rate 70 ml/hr  Fat emulsion at 10 ml/hr (MWF only due to ongoing shortage).  TNA to contain standard multivitamins and trace elements(MWF only due to ongoing shortage).  Continue KCl IV as ordered  NaPhos IV x1 dose   Continue IVF @ KVO  Continue sensitive slide scale insulin  TNA lab panels on Mondays & Thursdays.  F/u daily.   Lynann Beaver PharmD, BCPS Pager [phone] 11/23/2011 8:45 AM

## 2011-11-23 NOTE — Progress Notes (Signed)
4 Days Post-Op  Subjective: No major complaints,   Objective: Vital signs in last 24 hours: Temp:  [97.8 F (36.6 C)-98.7 F (37.1 C)] 97.9 F (36.6 C) (06/05 0800) Pulse Rate:  [69-90] 86  (06/05 0900) Resp:  [16-29] 16  (06/05 0900) BP: (141-166)/(57-90) 153/65 mmHg (06/05 0805) SpO2:  [93 %-100 %] 100 % (06/05 0900) Weight:  [82.3 kg (181 lb 7 oz)] 82.3 kg (181 lb 7 oz) (06/05 0400) Last BM Date: 11/11/11  Some colostomy output, 50 ml thru drain, 15 ml thru gastrostomy, afebrile, VSS, k+ still low, CXR looks better.  Intake/Output from previous day: 06/04 0701 - 06/05 0700 In: 2505 [I.V.:810; IV Piggyback:450; TPN:1245] Out: 2145 [Urine:2050; Drains:65; Stool:30] Intake/Output this shift: Total I/O In: 544 [I.V.:80; IV Piggyback:184; TPN:280] Out: 1400 [Urine:1400]  General appearance: alert, cooperative, fatigued, no distress and respiratory rate stays up. Resp: clear anterior exam GI: she remains very distended, hypoactive BS.  some flatus in ostomy, and smears of stool.  Lab Results:   Basename 11/23/11 0500 11/22/11 0510  WBC 18.0* 19.2*  HGB 8.8* 8.7*  HCT 27.9* 27.7*  PLT 279 262    BMET  Basename 11/23/11 0500 11/22/11 0510  NA 134* 136  K 3.3* 3.1*  CL 98 101  CO2 28 27  GLUCOSE 119* 135*  BUN 24* 25*  CREATININE 0.81 0.91  CALCIUM 8.8 8.6   PT/INR No results found for this basename: LABPROT:2,INR:2 in the last 72 hours   Lab 11/21/11 0415 11/19/11 1625  AST 12 18  ALT 12 15  ALKPHOS 48 40  BILITOT 0.3 0.5  PROT 4.9* 4.5*  ALBUMIN 1.8* 2.0*     Lipase  No results found for this basename: lipase     Studies/Results: Dg Chest Port 1 View  11/23/2011  *RADIOLOGY REPORT*  Clinical Data: Asthma.  PORTABLE CHEST - 1 VIEW  Comparison: 11/22/2011.  Findings: The right IJ catheter and pacer wires are stable.  The lungs show improved aeration with better lung volumes and resolving areas of atelectasis.  Stable left upper lobe calcified  granuloma.  IMPRESSION: Improved aeration with better lung volumes and resolving atelectasis.  Original Report Authenticated By: P. Loralie Champagne, M.D.   Dg Chest Port 1 View  11/22/2011  *RADIOLOGY REPORT*  Clinical Data: Respiratory distress.  PORTABLE CHEST - 1 VIEW  Comparison: 11/21/2011.  Findings: The pacer wires and right IJ catheter are stable.  The heart is normal in size.  Prominent mediastinal and hilar contours are stable.  Slightly lower lung volumes with increase and vascular congestion and atelectasis.  No edema or pneumothorax.  IMPRESSION: Slightly lower lung volumes but otherwise stable chest x-ray.  Original Report Authenticated By: P. Loralie Champagne, M.D.    Medications:    . albuterol-ipratropium  2 puff Inhalation Q4H  . antiseptic oral rinse  15 mL Mouth Rinse QID  . chlorhexidine  15 mL Mouth Rinse BID  . diphenhydrAMINE  25 mg Intravenous Once  . DULoxetine  30 mg Oral BID  . furosemide  40 mg Intravenous BID  . heparin subcutaneous  5,000 Units Subcutaneous Q8H  . hydrocortisone sodium succinate  25 mg Intravenous Q12H  . insulin aspart  0-9 Units Subcutaneous Q6H  . levothyroxine  26 mcg Intravenous QAC breakfast  . magnesium sulfate 1 - 4 g bolus IVPB  2 g Intravenous Once  . metoprolol  5 mg Intravenous Q6H  . pantoprazole (PROTONIX) IV  40 mg Intravenous Q24H  . potassium chloride  10 mEq Intravenous Q1 Hr x 6  . potassium chloride  10 mEq Intravenous Q1 Hr x 3  . potassium chloride      . sodium phosphate  Dextrose 5% IVPB  10 mmol Intravenous Once  . DISCONTD: amiodarone  200 mg Oral BID  . DISCONTD: potassium chloride  10 mEq Intravenous Q1 Hr x 4  . DISCONTD: potassium chloride  10 mEq Intravenous Q1 Hr x 4  . DISCONTD: potassium chloride  10 mEq Intravenous Q1 Hr x 4  . DISCONTD: potassium chloride  10 mEq Intravenous Q1 Hr x 4  . DISCONTD: potassium chloride      . DISCONTD: potassium chloride      . DISCONTD: potassium chloride         Assessment/Plan EXPLORATORY LAPAROTOMY, COLON RESECTION SIGMOID INCISION AND DRAINAGE ABSCESS for perforated diverticulitis - 11/12/2011 - Dr. Trude Mcburney  Wound dehiscence - Closed with Strattice mesh and VAC - D. Newman - 11/19/2011  Atrial fibrillation - seen by Dr. Burna Forts  On amiodarone, and chronic anticoagulation History of HEART FAILURE EF 30-35%  Chronic obstructive asthma, steroid dependant CCM/Medicine following  HYPOTHYROIDISM  On isolation for MRSA\  On TNA For malnutrition   She is stable, she will require long term hospitalization in an acute setting.  CCM ask if we would be agreeable to an LTAC. Will discuss with DR. Hoxworth.   Continue current Med RX. Wound dressing was changed yesterday.  The biologic Mesh is visible with removal of the VAC.  We placed her on White foam, and had the suction decreased to 100, as directed.  Wound looked quite good yesterday.     LOS: 12 days    [name] 11/23/2011

## 2011-11-23 NOTE — Progress Notes (Signed)
Name: [name] MRN: [id number] DOB: [birth year]    LOS: 12  Referring Provider:  EDP Reason for Referral:  shock  PULMONARY / CRITICAL CARE MEDICINE  BRIEF PROFILE:  7 yowf with steroid dep obstructive lung disease, CHF, CAF, divverticulitis adm by PCCM via [facility] am 5/24 with increased abd pain and hypotension. CT abd c/w free air and divertular abscess. Ex-lap 5/24 PM revealed perforated sigmoid diverticulitis. Underwent sigmoid colon resection and abscess drainage (Dr Biagio Quint). Remained intubated post op  SUBJECTIVE:  6/1- wound dehiscence, back to OR, post op resp failure 6/4 wants to remain in bed for feeling bad. 6/5 wants to get up  Vital Signs: Temp:  [97.8 F (36.6 C)-98.7 F (37.1 C)] 97.9 F (36.6 C) (06/05 0800) Pulse Rate:  [81-90] 87  (06/05 0600) Resp:  [16-29] 18  (06/05 0600) BP: (141-166)/(57-90) 141/57 mmHg (06/05 0600) SpO2:  [93 %-100 %] 99 % (06/05 0807) Weight:  [181 lb 7 oz (82.3 kg)] 181 lb 7 oz (82.3 kg) (06/05 0400) 4 liters  Physical Examination: HEENT: WNL, jvd, ETT Chest: coarse. +congested cough Heart: s1s2 no murmur Abd: No BS, wound vac, with open abd wound, colostomy with scant drainage Ext: warm, no edema Neuro: no focal deficits, follows commands  Active Problems:  HYPOTHYROIDISM  Atrial fibrillation  HEART FAILURE  History of hypertension  Respiratory failure following trauma and surgery  Chronic obstructive asthma  Perforated diverticulitis  Peritonitis  Hypotension  Acute respiratory failure   ASSESSMENT AND PLAN  PULMONARY Acute respiratory Failure, post op 6/1 COPD/fixed asthma, (Xolair, Singulair, Pred (7.5) are chronic meds). plan Dg Chest Port 1 View  11/23/2011  *RADIOLOGY REPORT*  Clinical Data: Asthma.  PORTABLE CHEST - 1 VIEW  Comparison: 11/22/2011.  Findings: The right IJ catheter and pacer wires are stable.  The lungs show improved aeration with better lung volumes and resolving areas of atelectasis.  Stable  left upper lobe calcified granuloma.  IMPRESSION: Improved aeration with better lung volumes and resolving atelectasis.  Original Report Authenticated By: P. Loralie Champagne, M.D.   Dg Chest Port 1 View  11/22/2011  *RADIOLOGY REPORT*  Clinical Data: Respiratory distress.  PORTABLE CHEST - 1 VIEW  Comparison: 11/21/2011.  Findings: The pacer wires and right IJ catheter are stable.  The heart is normal in size.  Prominent mediastinal and hilar contours are stable.  Slightly lower lung volumes with increase and vascular congestion and atelectasis.  No edema or pneumothorax.  IMPRESSION: Slightly lower lung volumes but otherwise stable chest x-ray.  Original Report Authenticated By: P. Loralie Champagne, M.D.   -wean O2 as tolerated -pulmonary toilet -solu cortef decreased 6/3 , would not go lower than 25 mg Q 12 h  CARDIOVASCULAR Cardiology consult 5/24   A: Afib w/ RVR, chronic systolic CHF (EF 40-98%) P:  -Per cards -Amiodarone remain oral but npo due to gut. No hurry to start amio unless tachycardia an issue. Check with [facility] about gut being available. Not available on 6/4 Stress steroids remain, lower 6/3 , need to reduce when able with wound healing but patient is chronically on steroids.  Already at 50 q12 of hydrocortisone, will decrease to 25 q 12. 6/4  RENAL  Lab 11/23/11 0500 11/22/11 0510 11/21/11 0415 11/20/11 0425 11/19/11 1625 11/18/11 0427  NA 134* 136 135 132* 134* --  K 3.3* 3.1* -- -- -- --  CL 98 101 102 100 100 --  CO2 28 27 25 25 27  --  BUN 24* 25* 24*  30* 37* --  CREATININE 0.81 0.91 1.03 1.22* 1.37* --  CALCIUM 8.8 8.6 8.6 8.0* 8.3* --  MG 1.9 2.0 2.1 -- 1.4* 1.6  PHOS 2.2* 2.5 2.0* -- 2.8 3.8   Intake/Output      06/04 0701 - 06/05 0700 06/05 0701 - 06/06 0700   I.V. (mL/kg) 790 (9.6)    IV Piggyback 450    TPN 1245    Total Intake(mL/kg) 2485 (30.2)    Urine (mL/kg/hr) 2050 (1)    Drains 65    Stool 30    Total Output 2145    Net +340            Intake/Output Summary (Last 24 hours) at 11/23/11 0842 Last data filed at 11/23/11 0700  Gross per 24 hour  Intake   2400 ml  Output   2045 ml  Net    355 ml   Foley:  In ER 5/24  A:  AKI / ATN, resolved P:   -CBC was hemoconcentration -Assess cvp, Q shift 6/3 -Even balance goal, reduction slight  GASTROINTESTINAL  Lab 11/21/11 0415 11/19/11 1625  AST 12 18  ALT 12 15  ALKPHOS 48 40  BILITOT 0.3 0.5  PROT 4.9* 4.5*  ALBUMIN 1.8* 2.0*   Diet : NPO  A:  Post op ileus - nutritional status an issue, tpn started -lft ppi  HEMATOLOGIC  Lab 11/23/11 0500 11/22/11 0510 11/21/11 0415 11/20/11 1040 11/20/11 0425  HGB 8.8* 8.7* 9.0* 9.7* 10.0*  HCT 27.9* 27.7* 28.6* 31.4* 32.5*  PLT 279 262 296 326 314  INR -- -- -- -- --  APTT -- -- -- -- --   A:  Coagulopathic from coumadin With no ostomy output unsure if I would start coumadin now  INFECTIOUS  Lab 11/23/11 0500 11/22/11 0510 11/21/11 0415 11/20/11 1040 11/20/11 0425  WBC 18.0* 19.2* 21.4* 25.1* 25.5*  PROCALCITON -- -- -- -- --   Cultures: UC 5/24 >>15K mult orgs.  BC x2 5/24 >>neg  Antibiotics: Vancomycin  5/24 only Zosyn 5/24 >>6/3  A:  Diverticular abscess due to perforated divertic/ low grade sepsis  6/1- wound dehiscence P:  Zosyn . DC 6/3  ENDOCRINE  A:  Hypothyroidism Plan: -synthroid iv  added 6/3 and check tsh(1.017) NEUROLOGIC  A:  Dementia dx pre op>>normal mental status 5/27. Alert and coperative 6/4  P:  -prn fent, versed Upright important, now abdo muscle issue  Global: 6/5 working on on LTAC placement  Redgranite Minor ACNP Adolph Pollack PCCM Pager [phone] till 3 pm If no answer page [phone] 11/23/2011, 8:42 AM  Will replace Mg and Phos, recheck in AM, continue PT/OT, if ok with surgery will transfer patient to LTAC.  Patient seen and examined, agree with above note.  I dictated the care and orders written for this patient under my direction.  Koren Bound, M.D. [phone]

## 2011-11-24 DIAGNOSIS — J45901 Unspecified asthma with (acute) exacerbation: Secondary | ICD-10-CM

## 2011-11-24 LAB — COMPREHENSIVE METABOLIC PANEL
ALT: 11 U/L (ref 0–35)
Albumin: 1.9 g/dL — ABNORMAL LOW (ref 3.5–5.2)
Alkaline Phosphatase: 55 U/L (ref 39–117)
BUN: 28 mg/dL — ABNORMAL HIGH (ref 6–23)
Chloride: 98 mEq/L (ref 96–112)
GFR calc Af Amer: 79 mL/min — ABNORMAL LOW (ref >90)
Glucose, Bld: 104 mg/dL — ABNORMAL HIGH (ref 70–99)
Potassium: 3.6 mEq/L (ref 3.5–5.1)
Sodium: 133 mEq/L — ABNORMAL LOW (ref 135–145)
Total Bilirubin: 0.2 mg/dL — ABNORMAL LOW (ref 0.3–1.2)
Total Protein: 5.2 g/dL — ABNORMAL LOW (ref 6.0–8.3)

## 2011-11-24 LAB — MAGNESIUM: Magnesium: 2.1 mg/dL (ref 1.5–2.5)

## 2011-11-24 LAB — CBC
Hemoglobin: 9 g/dL — ABNORMAL LOW (ref 12.0–15.0)
MCH: 28.3 pg (ref 26.0–34.0)
MCHC: 31.4 g/dL (ref 30.0–36.0)
MCV: 90.3 fL (ref 78.0–100.0)
RBC: 3.18 MIL/uL — ABNORMAL LOW (ref 3.87–5.11)

## 2011-11-24 LAB — GLUCOSE, CAPILLARY: Glucose-Capillary: 123 mg/dL — ABNORMAL HIGH (ref 70–99)

## 2011-11-24 MED ORDER — DIPHENHYDRAMINE HCL 50 MG/ML IJ SOLN
INTRAMUSCULAR | Status: AC
  Filled 2011-11-24: qty 1

## 2011-11-24 MED ORDER — CLINIMIX E/DEXTROSE (5/15) 5 % IV SOLN
INTRAVENOUS | Status: AC
  Administered 2011-11-24: 18:00:00 via INTRAVENOUS
  Filled 2011-11-24: qty 2000

## 2011-11-24 MED ORDER — DULOXETINE HCL 30 MG PO CPEP
30.0000 mg | ORAL_CAPSULE | Freq: Two times a day (BID) | ORAL | Status: DC
  Administered 2011-11-24 – 2011-12-02 (×17): 30 mg via ORAL
  Filled 2011-11-24 (×18): qty 1

## 2011-11-24 MED ORDER — DIPHENHYDRAMINE HCL 50 MG/ML IJ SOLN
25.0000 mg | Freq: Every evening | INTRAMUSCULAR | Status: DC | PRN
  Administered 2011-11-24 – 2011-12-01 (×8): 25 mg via INTRAVENOUS
  Filled 2011-11-24 (×8): qty 1

## 2011-11-24 MED ORDER — INSULIN ASPART 100 UNIT/ML ~~LOC~~ SOLN
0.0000 [IU] | Freq: Three times a day (TID) | SUBCUTANEOUS | Status: DC
  Administered 2011-11-24 – 2011-11-26 (×2): 1 [IU] via SUBCUTANEOUS
  Administered 2011-11-27: 2 [IU] via SUBCUTANEOUS
  Administered 2011-11-27 – 2011-12-01 (×2): 1 [IU] via SUBCUTANEOUS

## 2011-11-24 NOTE — Clinical Social Work Note (Signed)
CSW continues to follow for placement and support. CSW worked on SNF placement, but Pt would do much better at [facility]. Pt from independent living at [facility]. CSW completed FL2 and faxed out, if LTAC falls through. Son agrees to [facility] when appropriate.  CSW to follow.  Vennie Homans 11/24/2011 12:51 PM #[phone]

## 2011-11-24 NOTE — Progress Notes (Signed)
Physical Therapy Treatment Patient Details Name: [name] MRN: [id number] DOB: [birth year] Today's Date: 11/24/2011 Time: 4098-1191 PT Time Calculation (min): 35 min  PT Assessment / Plan / Recommendation Comments on Treatment Session  Pt pleasant and motivated to participate in PT for bed to chair with Huntley Dec lift. Pt fatigues quickly while in supported standing on Sara lift.     Follow Up Recommendations  Skilled nursing facility    Barriers to Discharge        Equipment Recommendations  Defer to next venue    Recommendations for Other Services    Frequency Min 3X/week   Plan Discharge plan remains appropriate;Frequency remains appropriate    Precautions / Restrictions Precautions Precautions: Fall Restrictions Weight Bearing Restrictions: No   Pertinent Vitals/Pain *3/10 lower abdomen @ incision site Pt premedicated**    Mobility  Bed Mobility Bed Mobility: Rolling Right Rolling Right: 4: Min assist Right Sidelying to Sit: 3: Mod assist Details for Bed Mobility Assistance: cues for sequence, technique; 2nd person to assist in managing lines Transfers Transfers: Sit to Stand;Stand to Sit Sit to Stand: 1: +2 Total assist;From bed (using Sara mechanical lift) Stand to Sit: 1: +2 Total assist;To chair/3-in-1 (with Water engineer) Transfer via Lift Equipment: Hydrographic surveyor Ambulation/Gait Ambulation/Gait Assistance: Not tested (comment)    Exercises     PT Diagnosis:    PT Problem List:   PT Treatment Interventions:     PT Goals Acute Rehab PT Goals Time For Goal Achievement: 12/02/11 Potential to Achieve Goals: Fair Pt will go Supine/Side to Sit: with supervision PT Goal: Supine/Side to Sit - Progress: Progressing toward goal Pt will go Sit to Stand: with supervision PT Goal: Sit to Stand - Progress: Progressing toward goal Pt will go Stand to Sit: with supervision PT Goal: Stand to Sit - Progress: Progressing toward goal  Visit Information  Last PT  Received On: 11/24/11 Assistance Needed: +2 PT/OT Co-Evaluation/Treatment: Yes    Subjective Data  Subjective: I feel so weak. I can't stand for long. Patient Stated Goal: to get stronger   Cognition  Overall Cognitive Status: Appears within functional limits for tasks assessed/performed Arousal/Alertness: Awake/alert Orientation Level: Appears intact for tasks assessed Behavior During Session: [facility] for tasks performed    Balance  Static Sitting Balance Static Sitting - Balance Support: Right upper extremity supported;Feet supported Static Sitting - Level of Assistance: 5: Stand by assistance Static Sitting - Comment/# of Minutes: 7  End of Session PT - End of Session Equipment Utilized During Treatment: Other (comment) [place] Dec Lift) Activity Tolerance: Patient tolerated treatment well Patient left: in chair;with call bell/phone within reach Nurse Communication: Need for lift equipment;Mobility status    Ralene Bathe Kistler 11/24/2011, 12:04 PM [phone]

## 2011-11-24 NOTE — Progress Notes (Signed)
Patient ID: [name], female   DOB: 08/02/1933, 78 y.o.   MRN: [id number] 5 Days Post-Op  Subjective: No major C/O. Some chest conjestion. No nausea. Has some stool per colostomy  Objective: Vital signs in last 24 hours: Temp:  [97.8 F (36.6 C)-99.1 F (37.3 C)] 98.7 F (37.1 C) (06/06 0400) Pulse Rate:  [79-87] 79  (06/06 0428) Resp:  [16-24] 16  (06/06 0428) BP: (129-158)/(58-70) 130/60 mmHg (06/06 0625) SpO2:  [94 %-100 %] 99 % (06/06 0428) Weight:  [173 lb 4.5 oz (78.6 kg)] 173 lb 4.5 oz (78.6 kg) (06/06 0000) Last BM Date:  (colostomy)  Intake/Output from previous day: 06/05 0701 - 06/06 0700 In: 2562 [I.V.:480; IV Piggyback:402; TPN:1680] Out: 4715 [Urine:4415; Drains:100; Stool:200] Intake/Output this shift: Total I/O In: 100 [I.V.:20; TPN:80] Out: -   General appearance: alert and no distress GI: normal findings: soft, non-tender, stoma OK Incision/Wound: VAC in place  Lab Results:   Basename 11/24/11 0400 11/23/11 0500  WBC 17.0* 18.0*  HGB 9.0* 8.8*  HCT 28.7* 27.9*  PLT 275 279   BMET  Basename 11/24/11 0400 11/23/11 0500  NA 133* 134*  K 3.6 3.3*  CL 98 98  CO2 30 28  GLUCOSE 104* 119*  BUN 28* 24*  CREATININE 0.82 0.81  CALCIUM 8.6 8.8     Studies/Results: Dg Chest Port 1 View  11/23/2011  *RADIOLOGY REPORT*  Clinical Data: Line placement.  PORTABLE CHEST - 1 VIEW  Comparison: 11/23/2011 and 11/22/2011.  Findings: 1503 hours. Interval right arm PICC placement.  The tip of the line overlaps the spine and is not well seen, but appears to be in the lower SVC.  The preexisting right IJ central venous catheter and left subclavian pacemaker leads are unchanged.  Heart size and mediastinal contours are stable.  There is stable patchy airspace disease at the left lung base and a stable calcified left upper lobe granuloma.  IMPRESSION: PICC placement as described without demonstrated complication.  Original Report Authenticated By: Gerrianne Scale, M.D.     Dg Chest Port 1 View  11/23/2011  *RADIOLOGY REPORT*  Clinical Data: Asthma.  PORTABLE CHEST - 1 VIEW  Comparison: 11/22/2011.  Findings: The right IJ catheter and pacer wires are stable.  The lungs show improved aeration with better lung volumes and resolving areas of atelectasis.  Stable left upper lobe calcified granuloma.  IMPRESSION: Improved aeration with better lung volumes and resolving atelectasis.  Original Report Authenticated By: P. Loralie Champagne, M.D.    Anti-infectives: Anti-infectives     Start     Dose/Rate Route Frequency Ordered Stop   11/11/11 2000   piperacillin-tazobactam (ZOSYN) IVPB 3.375 g  Status:  Discontinued        3.375 g 12.5 mL/hr over 240 Minutes Intravenous Every 8 hours 11/11/11 1928 11/21/11 1017   11/11/11 1130  piperacillin-tazobactam (ZOSYN) IVPB 3.375 g       3.375 g 100 mL/hr over 30 Minutes Intravenous  Once 11/11/11 1050 11/11/11 1200   11/11/11 1130   vancomycin (VANCOCIN) IVPB 1000 mg/200 mL premix        1,000 mg 200 mL/hr over 60 Minutes Intravenous  Once 11/11/11 1050 11/11/11 1341          Assessment/Plan: s/p Procedure(s): EXPLORATORY LAPAROTOMY GASTROSTOMY INSERTION OF MESH APPLICATION OF WOUND VAC Slow improvement.  Start CL diet   LOS: 13 days    Maybelline Kolarik T 11/24/2011

## 2011-11-24 NOTE — Consult Note (Signed)
WOC ostomy consult  Stoma type/location: LLQ COlostomy Stomal assessment/size: 1 and 3/4 inches round, budded Peristomal assessment: Intact, small blisters at periphery Treatment options for stomal/peristomal skin: none at this time Output small amount of brown stool Ostomy pouching: 1pc. Convex pouch [id number]   WOC consult Note Wound type:surgical wound with exposed biologic mesh Dressing procedure/placement/frequency: Wound V.A.C. changed with Dr. Johna Sheriff in attendance.  Plan on next V.A.C. change Saturday, June  8th.  WOC nurse will be in attendance.  Thanks, Ladona Mow, MSN, RN, [facility], CWOCN [phone])

## 2011-11-24 NOTE — Progress Notes (Signed)
Name: [name] MRN: [id number] DOB: August 30, 1933    LOS: 13  Referring Provider:  EDP Reason for Referral:  shock  PULMONARY / CRITICAL CARE MEDICINE  BRIEF PROFILE:  48 yowf with steroid dep obstructive lung disease, CHF, CAF, divverticulitis adm by PCCM via [facility] am 5/24 with increased abd pain and hypotension. CT abd c/w free air and divertular abscess. Ex-lap 5/24 PM revealed perforated sigmoid diverticulitis. Underwent sigmoid colon resection and abscess drainage (Dr Biagio Quint). Remained intubated post op  SUBJECTIVE:  6/1- wound dehiscence, back to OR, post op resp failure 6/4 wants to remain in bed for feeling bad. 6/5 wants to get up 6/5 rt picc line placed. 6/6 more awake.  Vital Signs: Temp:  [97.8 F (36.6 C)-99.1 F (37.3 C)] 98.7 F (37.1 C) (06/06 0400) Pulse Rate:  [79-87] 79  (06/06 0428) Resp:  [16-24] 16  (06/06 0428) BP: (129-158)/(58-70) 130/60 mmHg (06/06 0625) SpO2:  [94 %-100 %] 99 % (06/06 0428) Weight:  [173 lb 4.5 oz (78.6 kg)] 173 lb 4.5 oz (78.6 kg) (06/06 0000) 4 liters  Physical Examination: HEENT: WNL, jvd, ETT Chest: coarse. +congested cough Heart: s1s2 no murmur Abd: No BS, wound vac, with open abd wound, colostomy with scant drainage Ext: warm, no edema Neuro: no focal deficits, follows commands  Active Problems:  HYPOTHYROIDISM  Atrial fibrillation  HEART FAILURE  History of hypertension  Respiratory failure following trauma and surgery  Chronic obstructive asthma  Perforated diverticulitis  Peritonitis  Hypotension  Acute respiratory failure   ASSESSMENT AND PLAN  PULMONARY Acute respiratory Failure, post op 6/1 COPD/fixed asthma, (Xolair, Singulair, Pred (7.5) are chronic meds). plan Dg Chest Port 1 View  11/23/2011  *RADIOLOGY REPORT*  Clinical Data: Line placement.  PORTABLE CHEST - 1 VIEW  Comparison: 11/23/2011 and 11/22/2011.  Findings: 1503 hours. Interval right arm PICC placement.  The tip of the line overlaps the  spine and is not well seen, but appears to be in the lower SVC.  The preexisting right IJ central venous catheter and left subclavian pacemaker leads are unchanged.  Heart size and mediastinal contours are stable.  There is stable patchy airspace disease at the left lung base and a stable calcified left upper lobe granuloma.  IMPRESSION: PICC placement as described without demonstrated complication.  Original Report Authenticated By: Gerrianne Scale, M.D.   Dg Chest Port 1 View  11/23/2011  *RADIOLOGY REPORT*  Clinical Data: Asthma.  PORTABLE CHEST - 1 VIEW  Comparison: 11/22/2011.  Findings: The right IJ catheter and pacer wires are stable.  The lungs show improved aeration with better lung volumes and resolving areas of atelectasis.  Stable left upper lobe calcified granuloma.  IMPRESSION: Improved aeration with better lung volumes and resolving atelectasis.  Original Report Authenticated By: P. Loralie Champagne, M.D.   -wean O2 as tolerated -pulmonary toilet -solu cortef decreased 6/3 , would not go lower than 25 mg Q 12 h -pt as tolerated  CARDIOVASCULAR Cardiology consult 5/24   A: Afib w/ RVR, chronic systolic CHF (EF 64-40%) P:  -Per cards -Amiodarone remain oral but npo due to gut. No hurry to start amio unless tachycardia an issue. Check with CCS about gut being available. Not available on 6/4 Stress steroids remain, lower 6/3 , need to reduce when able with wound healing but patient is chronically on steroids.  Already at 50 q12 of hydrocortisone, will decrease to 25 q 12. 6/4  RENAL  Lab 11/24/11 0400 11/23/11 0500 11/22/11 0510 11/21/11  1610 11/20/11 0425 11/19/11 1625  NA 133* 134* 136 135 132* --  K 3.6 3.3* -- -- -- --  CL 98 98 101 102 100 --  CO2 30 28 27 25 25  --  BUN 28* 24* 25* 24* 30* --  CREATININE 0.82 0.81 0.91 1.03 1.22* --  CALCIUM 8.6 8.8 8.6 8.6 8.0* --  MG 2.1 1.9 2.0 2.1 -- 1.4*  PHOS 2.5 2.2* 2.5 2.0* -- 2.8   Intake/Output      06/05 0701 - 06/06 0700  06/06 0701 - 06/07 0700   I.V. (mL/kg) 480 (6.1) 20 (0.3)   IV Piggyback 402    TPN 1680 80   Total Intake(mL/kg) 2562 (32.6) 100 (1.3)   Urine (mL/kg/hr) 4415 (2.3)    Drains 100    Stool 200    Total Output 4715    Net -2153 +100          Intake/Output Summary (Last 24 hours) at 11/24/11 0853 Last data filed at 11/24/11 0800  Gross per 24 hour  Intake   2572 ml  Output   4515 ml  Net  -1943 ml   Foley:  In ER 5/24  A:  AKI / ATN, resolved P:   -CBC was hemoconcentration -Assess cvp, Q shift 6/3 -Even balance goal, reduction slight  GASTROINTESTINAL  Lab 11/24/11 0400 11/21/11 0415 11/19/11 1625  AST 14 12 18   ALT 11 12 15   ALKPHOS 55 48 40  BILITOT 0.2* 0.3 0.5  PROT 5.2* 4.9* 4.5*  ALBUMIN 1.9* 1.8* 2.0*   Diet : NPO  A:  Post op ileus - nutritional status an issue, tpn started -lft ppi  HEMATOLOGIC  Lab 11/24/11 0400 11/23/11 0500 11/22/11 0510 11/21/11 0415 11/20/11 1040  HGB 9.0* 8.8* 8.7* 9.0* 9.7*  HCT 28.7* 27.9* 27.7* 28.6* 31.4*  PLT 275 279 262 296 326  INR -- -- -- -- --  APTT -- -- -- -- --   A:  Coagulopathic from coumadin With no ostomy output unsure if I would start coumadin now  INFECTIOUS  Lab 11/24/11 0400 11/23/11 0500 11/22/11 0510 11/21/11 0415 11/20/11 1040  WBC 17.0* 18.0* 19.2* 21.4* 25.1*  PROCALCITON -- -- -- -- --   Cultures: UC 5/24 >>15K mult orgs.  BC x2 5/24 >>neg  Antibiotics: Vancomycin  5/24 only Zosyn 5/24 >>6/3  A:  Diverticular abscess due to perforated divertic/ low grade sepsis  6/1- wound dehiscence P:  Zosyn . DC 6/3  ENDOCRINE  A:  Hypothyroidism Plan: -synthroid iv  added 6/3 and check tsh(1.017) NEUROLOGIC  A:  Dementia dx pre op>>normal mental status 5/27. Alert and coperative 6/4  P:  -prn fent, versed Upright important, now abdo muscle issue  Global: 6/5 working on on LTAC placement. 6/6 spoke with Dr Johna Sheriff 6/6 . No Ltac until wound vac has been changed and stable. CCS will let  PCCM know when that is.  Brett Canales Minor ACNP [facility] PCCM Pager [phone] till 3 pm If no answer page [phone] 11/24/2011, 8:53 AM  Will hold in SDU overnight then likely transfer in AM.  Patient seen and examined, agree with above note.  I dictated the care and orders written for this patient under my direction.  Koren Bound, M.D. [phone]

## 2011-11-24 NOTE — Progress Notes (Signed)
PARENTERAL NUTRITION CONSULT NOTE  Pharmacy Consult for TNA Indication: Sigmoid rupture/Colostomy  Allergies  Allergen Reactions  . Aspirin   . Ibuprofen   . Iron Dextran     REACTION: itching after test dose  . Metoclopramide Hcl     REACTION: chomping mouth  . Sulfonamide Derivatives    Patient Measurements: Height: 5\' 3"  (160 cm) Weight: 173 lb 4.5 oz (78.6 kg) IBW/kg (Calculated) : 52.4  Adjusted Body Weight: 62kg Usual Weight: 74kg  Vital Signs: Temp: 98.7 F (37.1 C) (06/06 0400) Temp src: Oral (06/06 0400) BP: 130/60 mmHg (06/06 0625) Pulse Rate: 79  (06/06 0428) Intake/Output from previous day: 06/05 0701 - 06/06 0700 In: 2562 [I.V.:480; IV Piggyback:402; TPN:1680] Out: 4715 [Urine:4415; Drains:100; Stool:200]  Labs:  Basename 11/24/11 0400 11/23/11 0500 11/22/11 0510  WBC 17.0* 18.0* 19.2*  HGB 9.0* 8.8* 8.7*  HCT 28.7* 27.9* 27.7*  PLT 275 279 262  APTT -- -- --  INR -- -- --     Basename 11/24/11 0400 11/23/11 0500 11/22/11 0510  NA 133* 134* 136  K 3.6 3.3* 3.1*  CL 98 98 101  CO2 30 28 27   GLUCOSE 104* 119* 135*  BUN 28* 24* 25*  CREATININE 0.82 0.81 0.91  LABCREA -- -- --  CREAT24HRUR -- -- --  CALCIUM 8.6 8.8 8.6  MG 2.1 1.9 2.0  PHOS 2.5 2.2* 2.5  PROT 5.2* -- --  ALBUMIN 1.9* -- --  AST 14 -- --  ALT 11 -- --  ALKPHOS 55 -- --  BILITOT 0.2* -- --  BILIDIR -- -- --  IBILI -- -- --  PREALBUMIN -- -- --  TRIG -- -- --  CHOLHDL -- -- --  CHOL -- -- --   Estimated Creatinine Clearance: 58.9 ml/min (by C-G formula based on Cr of 0.82).    Basename 11/24/11 0615 11/23/11 2343 11/23/11 1743  GLUCAP 81 95 99    Medications:  Scheduled:    . albuterol-ipratropium  2 puff Inhalation Q4H  . antiseptic oral rinse  15 mL Mouth Rinse QID  . chlorhexidine  15 mL Mouth Rinse BID  . diphenhydrAMINE  25 mg Intravenous Once  . DULoxetine  30 mg Oral BID  . furosemide  40 mg Intravenous BID  . heparin subcutaneous  5,000 Units  Subcutaneous Q8H  . hydrocortisone sodium succinate  25 mg Intravenous Q12H  . insulin aspart  0-9 Units Subcutaneous Q6H  . levothyroxine  26 mcg Intravenous QAC breakfast  . magnesium sulfate 1 - 4 g bolus IVPB  2 g Intravenous Once  . metoprolol  5 mg Intravenous Q6H  . pantoprazole (PROTONIX) IV  40 mg Intravenous Q24H  . potassium chloride  10 mEq Intravenous Q1 Hr x 3  . sodium chloride  10-40 mL Intracatheter Q12H  . sodium phosphate  Dextrose 5% IVPB  10 mmol Intravenous Once  . DISCONTD: DULoxetine  30 mg Oral BID    Insulin Requirements in the past 24 hours:  CBG's 81-110 Sensitive SSI - none given   Current Nutrition:  Clear Liquid Diet (started 6/6) Clinimix 5/15 at 70 ml/hr + Lipids MWF IVF: D5 0.45NaCl 20KCl at [facility]  Nutritional Goals:  Per RD 6/2 note: 1400-1500 Kcal and 75-85 grams protein/day.  TPN to meet > 90% estimated needs.    Clinimix 5/15 at goal rate 70 ml/hr will provide on average 1398 Kcal and 84 grams protein/day (1672 kcal/day with lipids, 1193 kcal/day without)  Assessment:  78 yo Female  with perforated diverticular abscess; resection, abscess drainage 5/25. Underwent wound closure/mesh 6/2.   At risk for re-feeding syndrome with prolonged NPO.  Avoid volume overload with HF.  No history of DM, on Hydrocortisone IV, and chronic steroid use PTA for COPD.  output from colostomy documented on 6/5 Electrolytes/Labs:  Na just below normal (unable to adjust in Clinimix product), other electrolytes wnl  AST/ALT WNL (6/6)  Cholesterol and TG WNL on 6/3  Pre-Albumin 10.4 on 6/3, Albumin low at 1.9 (6/6)  CBG's WNL  Plan: At 1800 today  Continue Clinimix E 5/15 at goal rate 70 ml/hr  Fat emulsion at 10 ml/hr (MWF only due to ongoing shortage).  TNA to contain standard multivitamins and trace elements(MWF only due to ongoing shortage).  Continue IVF @ KVO  Decrease CBGs and sensitive slide scale insulin to q8h  Follow up diet and  PO intake  TNA lab panels on Mondays & Thursdays.  F/u daily.   Lynann Beaver PharmD, BCPS Pager [phone] 11/24/2011 9:47 AM

## 2011-11-24 NOTE — Progress Notes (Signed)
Occupational Therapy Treatment and Goal Update Patient Details Name: [name] MRN: [id number] DOB: [birth year] Today's Date: 11/24/2011 Time: 4782-9562 OT Time Calculation (min): 33 min  OT Assessment / Plan / Recommendation Comments on Treatment Session Pt's status similar but she had further sx on abdomen.  Not comfortable moving without Sara Plus lift.  Goals revised today.      Follow Up Recommendations  Skilled nursing facility    Barriers to Discharge       Equipment Recommendations  Defer to next venue    Recommendations for Other Services    Frequency Min 2X/week   Plan Discharge plan remains appropriate    Precautions / Restrictions Precautions Precautions: Fall Restrictions Weight Bearing Restrictions: No   Pertinent Vitals/Pain VSS    ADL  Transfers/Ambulation Related to ADLs: cotx with PT.  Used Huntley Dec Plus for transfer at [employer] request.  Will try to increase standing tolerance:  pt very anxious standing, even with lift ADL Comments: agreeable to theraband exercises:  doesn't feel comfortable doing on her own.  Will review later, if schedule permits    OT Diagnosis:    OT Problem List:   OT Treatment Interventions:     OT Goals Acute Rehab OT Goals Time For Goal Achievement: 12/08/11 Potential to Achieve Goals: Good ADL Goals ADL Goal: Lower Body Bathing - Progress: Discontinued (comment) ADL Goal: Lower Body Dressing - Progress: Discontinued (comment) ADL Goal: Toilet Transfer - Progress: Discontinued (comment) ADL Goal: Toileting - Hygiene - Progress: Discontinued (comment) Arm Goals Pt Will Complete Theraband Exer: to increase strength;Bilateral upper extremities;2 sets;5 reps;Other (comment) Arm Goal: Theraband Exercises - Progress: Goal set today Miscellaneous OT Goals Miscellaneous OT Goal #1: Pt will verbalize 3 energy conservation techniques OT Goal: Miscellaneous Goal #1 - Progress: Goal set today Miscellaneous OT Goal #2: Pt will stand in Sara  Plus for 2 minutes, performing grooming activity to increase standing tolerance OT Goal: Miscellaneous Goal #2 - Progress: Goal set today Miscellaneous OT Goal #3: Pt will demonstrate use of AE from seated level with supervision OT Goal: Miscellaneous Goal #3 - Progress: Goal set today  Visit Information  Last OT Received On: 11/24/11 Assistance Needed: +2 PT/OT Co-Evaluation/Treatment: Yes    Subjective Data  Subjective: "I can't stand too long"   Prior Functioning       Cognition  Overall Cognitive Status: Appears within functional limits for tasks assessed/performed Arousal/Alertness: Awake/alert Orientation Level: Appears intact for tasks assessed Behavior During Session: [facility] for tasks performed    Mobility Bed Mobility Bed Mobility: Rolling Right Rolling Right: 4: Min assist Right Sidelying to Sit: 3: Mod assist Details for Bed Mobility Assistance: cues for sequence, technique; 2nd person to assist in managing lines Transfers Sit to Stand:  (used Huntley Dec plus) Stand to Sit: 1: +2 Total assist;To chair/3-in-1 (with Water engineer) Transfer via Financial trader: Chiropractor Sitting - Balance Support: Right upper extremity supported;Feet supported Static Sitting - Level of Assistance: 5: Stand by assistance Static Sitting - Comment/# of Minutes: 7  End of Session OT - End of Session Activity Tolerance: Patient tolerated treatment well Patient left: in chair;with call bell/phone within reach   [facility] 11/24/2011, 12:26 PM

## 2011-11-25 DIAGNOSIS — Z8619 Personal history of other infectious and parasitic diseases: Secondary | ICD-10-CM

## 2011-11-25 DIAGNOSIS — Z8679 Personal history of other diseases of the circulatory system: Secondary | ICD-10-CM

## 2011-11-25 LAB — BASIC METABOLIC PANEL
BUN: 28 mg/dL — ABNORMAL HIGH (ref 6–23)
CO2: 32 mEq/L (ref 19–32)
Calcium: 8.6 mg/dL (ref 8.4–10.5)
Chloride: 94 mEq/L — ABNORMAL LOW (ref 96–112)
Creatinine, Ser: 0.84 mg/dL (ref 0.50–1.10)
GFR calc Af Amer: 77 mL/min — ABNORMAL LOW (ref >90)

## 2011-11-25 LAB — CBC
HCT: 26.6 % — ABNORMAL LOW (ref 36.0–46.0)
MCH: 28.8 pg (ref 26.0–34.0)
MCV: 90.2 fL (ref 78.0–100.0)
RDW: 14.5 % (ref 11.5–15.5)
WBC: 15.8 10*3/uL — ABNORMAL HIGH (ref 4.0–10.5)

## 2011-11-25 LAB — GLUCOSE, CAPILLARY
Glucose-Capillary: 104 mg/dL — ABNORMAL HIGH (ref 70–99)
Glucose-Capillary: 98 mg/dL (ref 70–99)
Glucose-Capillary: 105 mg/dL — ABNORMAL HIGH (ref 70–99)

## 2011-11-25 MED ORDER — POTASSIUM CHLORIDE 10 MEQ/50ML IV SOLN
10.0000 meq | INTRAVENOUS | Status: AC
  Administered 2011-11-25 (×5): 10 meq via INTRAVENOUS
  Filled 2011-11-25 (×2): qty 50
  Filled 2011-11-25: qty 150

## 2011-11-25 MED ORDER — ZINC TRACE METAL 1 MG/ML IV SOLN
INTRAVENOUS | Status: AC
  Administered 2011-11-25: 18:00:00 via INTRAVENOUS
  Filled 2011-11-25: qty 2000

## 2011-11-25 MED ORDER — ALPRAZOLAM 0.25 MG PO TABS
0.2500 mg | ORAL_TABLET | Freq: Three times a day (TID) | ORAL | Status: DC | PRN
  Administered 2011-11-25 – 2011-12-02 (×12): 0.25 mg via ORAL
  Filled 2011-11-25 (×13): qty 1

## 2011-11-25 MED ORDER — FAT EMULSION 20 % IV EMUL
240.0000 mL | INTRAVENOUS | Status: AC
  Administered 2011-11-25: 240 mL via INTRAVENOUS
  Filled 2011-11-25: qty 250

## 2011-11-25 NOTE — Progress Notes (Signed)
Name: [name] MRN: [id number] DOB: 04/15/1934    LOS: 14  Referring Provider:  EDP Reason for Referral:  shock  PULMONARY / CRITICAL CARE MEDICINE  BRIEF PROFILE:  40 yowf with steroid dep obstructive lung disease, CHF, CAF, divverticulitis adm by PCCM via [facility] am 5/24 with increased abd pain and hypotension. CT abd c/w free air and divertular abscess. Ex-lap 5/24 PM revealed perforated sigmoid diverticulitis. Underwent sigmoid colon resection and abscess drainage (Dr Biagio Quint). Remained intubated post op  SUBJECTIVE:  6/1- wound dehiscence, back to OR, post op resp failure 6/4 wants to remain in bed for feeling bad. 6/5 wants to get up 6/5 rt picc line placed. 6/6 more awake. 6/7 adamant about not going to floor.  Vital Signs: Temp:  [97.9 F (36.6 C)-99.1 F (37.3 C)] 97.9 F (36.6 C) (06/07 0831) Pulse Rate:  [77-93] 77  (06/07 0400) Resp:  [17-21] 17  (06/07 0400) BP: (124-133)/(50-61) 124/50 mmHg (06/07 0400) SpO2:  [93 %-100 %] 96 % (06/07 0400) FiO2 (%):  [21 %] 21 % (06/07 0017) Weight:  [176 lb 9.4 oz (80.1 kg)] 176 lb 9.4 oz (80.1 kg) (06/07 0006) 4 liters  Physical Examination: HEENT: WNL Chest: coarse. +congested cough Heart: s1s2 no murmur Abd: No BS, wound vac, with open abd wound, colostomy with scant drainage +gas Ext: warm, no edema Neuro: no focal deficits, follows commands  Active Problems:  HYPOTHYROIDISM  Atrial fibrillation  HEART FAILURE  History of hypertension  Respiratory failure following trauma and surgery  Chronic obstructive asthma  Perforated diverticulitis  Peritonitis  Hypotension  Acute respiratory failure   ASSESSMENT AND PLAN  PULMONARY Acute respiratory Failure, post op 6/1 COPD/fixed asthma, (Xolair, Singulair, Pred (7.5) are chronic meds). plan Dg Chest Port 1 View  11/23/2011  *RADIOLOGY REPORT*  Clinical Data: Line placement.  PORTABLE CHEST - 1 VIEW  Comparison: 11/23/2011 and 11/22/2011.  Findings: 1503 hours.  Interval right arm PICC placement.  The tip of the line overlaps the spine and is not well seen, but appears to be in the lower SVC.  The preexisting right IJ central venous catheter and left subclavian pacemaker leads are unchanged.  Heart size and mediastinal contours are stable.  There is stable patchy airspace disease at the left lung base and a stable calcified left upper lobe granuloma.  IMPRESSION: PICC placement as described without demonstrated complication.  Original Report Authenticated By: Gerrianne Scale, M.D.   -wean O2 as tolerated -pulmonary toilet -solu cortef decreased 6/3 , would not go lower than 25 mg Q 12 h -pt as tolerated  CARDIOVASCULAR Cardiology consult 5/24   A: Afib w/ RVR, chronic systolic CHF (EF 08-65%) P:  -Per cards -Amiodarone remains on hold as of 6/7. Gut now availabe. Cards to decide if Amio to be restarted Stress steroids remain, lower 6/3 , need to reduce when able with wound healing but patient is chronically on steroids.  Already at 50 q12 of hydrocortisone, will decrease to 25 q 12. 6/4  RENAL  Lab 11/25/11 0421 11/24/11 0400 11/23/11 0500 11/22/11 0510 11/21/11 0415  NA 131* 133* 134* 136 135  K 3.1* 3.6 -- -- --  CL 94* 98 98 101 102  CO2 32 30 28 27 25   BUN 28* 28* 24* 25* 24*  CREATININE 0.84 0.82 0.81 0.91 1.03  CALCIUM 8.6 8.6 8.8 8.6 8.6  MG 1.9 2.1 1.9 2.0 2.1  PHOS 2.7 2.5 2.2* 2.5 2.0*   Intake/Output  06/06 0701 - 06/07 0700 06/07 0701 - 06/08 0700   P.O. 360    I.V. (mL/kg) 460 (5.7)    IV Piggyback 68    TPN 1840    Total Intake(mL/kg) 2728 (34.1)    Urine (mL/kg/hr) 3750 (2)    Drains     Stool     Total Output 3750    Net -1022           Intake/Output Summary (Last 24 hours) at 11/25/11 0941 Last data filed at 11/25/11 0614  Gross per 24 hour  Intake   2524 ml  Output   3600 ml  Net  -1076 ml   Foley:  In ER 5/24  A:  AKI / ATN, resolved. Hypokalemia P:   -CBC was hemoconcentration -Assess cvp, Q  shift 6/3 -Even balance goal, reduction slight -replete K+  GASTROINTESTINAL  Lab 11/24/11 0400 11/21/11 0415 11/19/11 1625  AST 14 12 18   ALT 11 12 15   ALKPHOS 55 48 40  BILITOT 0.2* 0.3 0.5  PROT 5.2* 4.9* 4.5*  ALBUMIN 1.9* 1.8* 2.0*   Diet : full liquids 6/6  A:  Post op ileus, resolving 6/7 - nutritional status an issue, tpn started -lft ppi  HEMATOLOGIC  Lab 11/25/11 0421 11/24/11 0400 11/23/11 0500 11/22/11 0510 11/21/11 0415  HGB 8.5* 9.0* 8.8* 8.7* 9.0*  HCT 26.6* 28.7* 27.9* 27.7* 28.6*  PLT 246 275 279 262 296  INR -- -- -- -- --  APTT -- -- -- -- --   A:  Coagulopathic from coumadin With lowostomy output unsure if I would start coumadin now as of 6/7  INFECTIOUS  Lab 11/25/11 0421 11/24/11 0400 11/23/11 0500 11/22/11 0510 11/21/11 0415  WBC 15.8* 17.0* 18.0* 19.2* 21.4*  PROCALCITON -- -- -- -- --   Cultures: UC 5/24 >>15K mult orgs.  BC x2 5/24 >>neg  Antibiotics: Vancomycin  5/24 only Zosyn 5/24 >>6/3  A:  Diverticular abscess due to perforated divertic/ low grade sepsis  6/1- wound dehiscence P:  Zosyn . DC 6/3  ENDOCRINE  A:  Hypothyroidism Plan: -synthroid iv  added 6/3 and check tsh(1.017) NEUROLOGIC  A:  Dementia dx pre op>>normal mental status 5/27. Alert and coperative 6/4  P:  -prn fent, versed Upright important, now abdo muscle issue  Global: 6/5 working on on LTAC placement. 6/6 spoke with Dr [name] 6/6 . No Ltac until wound vac has been changed and stable. CCS will let PCCM know when that is. 6/7 pt does not want to go to floor, therefore remain in SDU.  [name] ACNP [name] PCCM Pager [phone] till 3 pm If no answer page [phone] 11/25/2011, 9:41 AM  Hold in SDU until Monday per surgery's recommendations, continue to look for LTAC after wound vac is changed.  Patient seen and examined, agree with above note.  I dictated the care and orders written for this patient under my direction.  [name],  M.D. [phone]

## 2011-11-25 NOTE — Progress Notes (Signed)
PARENTERAL NUTRITION CONSULT NOTE  Pharmacy Consult for TNA Indication: Sigmoid rupture/Colostomy  Allergies  Allergen Reactions  . Aspirin   . Ibuprofen   . Iron Dextran     REACTION: itching after test dose  . Metoclopramide Hcl     REACTION: chomping mouth  . Sulfonamide Derivatives    Patient Measurements: Height: 5\' 3"  (160 cm) Weight: 176 lb 9.4 oz (80.1 kg) IBW/kg (Calculated) : 52.4  Adjusted Body Weight: 62kg Usual Weight: 74kg  Vital Signs: Temp: 97.9 F (36.6 C) (06/07 0831) Temp src: Oral (06/07 0831) BP: 124/50 mmHg (06/07 0400) Pulse Rate: 77  (06/07 0400) Intake/Output from previous day: 06/06 0701 - 06/07 0700 In: 2728 [P.O.:360; I.V.:460; IV Piggyback:68; TPN:1840] Out: 3750 [Urine:3750]  Labs:  [facility] 11/25/11 0421 11/24/11 0400 11/23/11 0500  WBC 15.8* 17.0* 18.0*  HGB 8.5* 9.0* 8.8*  HCT 26.6* 28.7* 27.9*  PLT 246 275 279  APTT -- -- --  INR -- -- --     Basename 11/25/11 0421 11/24/11 0400 11/23/11 0500  NA 131* 133* 134*  K 3.1* 3.6 3.3*  CL 94* 98 98  CO2 32 30 28  GLUCOSE 116* 104* 119*  BUN 28* 28* 24*  CREATININE 0.84 0.82 0.81  LABCREA -- -- --  CREAT24HRUR -- -- --  CALCIUM 8.6 8.6 8.8  MG 1.9 2.1 1.9  PHOS 2.7 2.5 2.2*  PROT -- 5.2* --  ALBUMIN -- 1.9* --  AST -- 14 --  ALT -- 11 --  ALKPHOS -- 55 --  BILITOT -- 0.2* --  BILIDIR -- -- --  IBILI -- -- --  PREALBUMIN -- -- --  TRIG -- -- --  CHOLHDL -- -- --  CHOL -- -- --   Estimated Creatinine Clearance: 58 ml/min (by C-G formula based on Cr of 0.84).    Basename 11/25/11 0542 11/24/11 2146 11/24/11 1211  GLUCAP 104* 89 123*    Medications:  Scheduled:     . albuterol-ipratropium  2 puff Inhalation Q4H  . antiseptic oral rinse  15 mL Mouth Rinse QID  . chlorhexidine  15 mL Mouth Rinse BID  . diphenhydrAMINE      . DULoxetine  30 mg Oral BID  . furosemide  40 mg Intravenous BID  . heparin subcutaneous  5,000 Units Subcutaneous Q8H  . hydrocortisone  sodium succinate  25 mg Intravenous Q12H  . insulin aspart  0-9 Units Subcutaneous Q8H  . levothyroxine  26 mcg Intravenous QAC breakfast  . metoprolol  5 mg Intravenous Q6H  . pantoprazole (PROTONIX) IV  40 mg Intravenous Q24H  . potassium chloride  10 mEq Intravenous Q1 Hr x 5  . sodium chloride  10-40 mL Intracatheter Q12H  . DISCONTD: DULoxetine  30 mg Oral BID  . DISCONTD: insulin aspart  0-9 Units Subcutaneous Q6H    Insulin Requirements in the past 24 hours:  CBG's 81-123 Sensitive SSI - 1 unit   Current Nutrition:  Clear Liquid Diet (started 6/6) Clinimix 5/15 at 70 ml/hr + Lipids MWF IVF: D5 0.45NaCl 20KCl at [facility]  Nutritional Goals:  Per RD 6/2 note: 1400-1500 Kcal and 75-85 grams protein/day.  TPN to meet > 90% estimated needs.    Clinimix 5/15 at goal rate 70 ml/hr will provide on average 1398 Kcal and 84 grams protein/day (1672 kcal/day with lipids, 1193 kcal/day without)  Assessment:  78 yo Female with perforated diverticular abscess; resection, abscess drainage 5/25. Underwent wound closure/mesh 6/2.   At risk for re-feeding syndrome  with prolonged NPO.  Avoid volume overload with HF.  No history of DM, on Hydrocortisone IV, and chronic steroid use PTA for COPD.  PO intake (CLD) - 360 ml on 6/6  GI PPx: Protonix IV Electrolytes/Labs:  Na low and trending down, but unable to adjust Na-content in Clinimix product.  K low - KCl IV x5 runs already ordered this am  AST/ALT WNL (6/6)  Cholesterol and TG WNL on 6/3  Pre-Albumin 10.4 on 6/3, Albumin low at 1.9 (6/6)  CBG's WNL  Plan: At 1800 today  Continue Clinimix E 5/15 at goal rate 70 ml/hr  Fat emulsion at 10 ml/hr (MWF only due to ongoing shortage).  TNA to contain standard multivitamins and trace elements(MWF only due to ongoing shortage).  Continue IVF @ KVO  Continue CBGs and sensitive slide scale insulin at q8h  Follow up diet and PO intake - hopefully can begin to wean TNA  soon.  TNA lab panels on Mondays & Thursdays.  F/u daily.  Darrol Angel, PharmD Pager: [phone] 11/25/2011 8:48 AM

## 2011-11-25 NOTE — Progress Notes (Signed)
OT Note:  Attempted tx twice today.  Pt was fatiqued earlier and is now in the process of moving rooms.  Will reattempt next week.  Benton, Kevin [phone] 11/25/2011

## 2011-11-25 NOTE — Progress Notes (Signed)
Patient ID: [name], female   DOB: November 21, 1933, 78 y.o.   MRN: [id number] 6 Days Post-Op  Subjective: Doing pretty well today, up out of bed.  No specific complaints. Tolerating clear liquids Objective: Vital signs in last 24 hours: Temp:  [97.9 F (36.6 C)-99.1 F (37.3 C)] 97.9 F (36.6 C) (06/07 0831) Pulse Rate:  [77-93] 77  (06/07 0400) Resp:  [17-21] 17  (06/07 0400) BP: (124-133)/(50-61) 124/50 mmHg (06/07 0400) SpO2:  [93 %-100 %] 96 % (06/07 0400) FiO2 (%):  [21 %] 21 % (06/07 0017) Weight:  [176 lb 9.4 oz (80.1 kg)] 176 lb 9.4 oz (80.1 kg) (06/07 0006) Last BM Date: 11/24/11 (small amt in bag earlier today, gas now)  Intake/Output from previous day: 06/06 0701 - 06/07 0700 In: 2728 [P.O.:360; I.V.:460; IV Piggyback:68; TPN:1840] Out: 3750 [Urine:3750] Intake/Output this shift:    General appearance: alert and no distress Resp: clear to auscultation bilaterally GI: normal findings: soft, non-tender, colostomy functioning Incision/Wound: VAC in place, intact,  Wound examined during VAC change yesterday, intact but not much granulation yet   Lab Results:   Basename 11/25/11 0421 11/24/11 0400  WBC 15.8* 17.0*  HGB 8.5* 9.0*  HCT 26.6* 28.7*  PLT 246 275   BMET  Basename 11/25/11 0421 11/24/11 0400  NA 131* 133*  K 3.1* 3.6  CL 94* 98  CO2 32 30  GLUCOSE 116* 104*  BUN 28* 28*  CREATININE 0.84 0.82  CALCIUM 8.6 8.6     Studies/Results: Dg Chest Port 1 View  11/23/2011  *RADIOLOGY REPORT*  Clinical Data: Line placement.  PORTABLE CHEST - 1 VIEW  Comparison: 11/23/2011 and 11/22/2011.  Findings: 1503 hours. Interval right arm PICC placement.  The tip of the line overlaps the spine and is not well seen, but appears to be in the lower SVC.  The preexisting right IJ central venous catheter and left subclavian pacemaker leads are unchanged.  Heart size and mediastinal contours are stable.  There is stable patchy airspace disease at the left lung base and a  stable calcified left upper lobe granuloma.  IMPRESSION: PICC placement as described without demonstrated complication.  Original Report Authenticated By: Gerrianne Scale, M.D.    Anti-infectives: Anti-infectives     Start     Dose/Rate Route Frequency Ordered Stop   11/11/11 2000   piperacillin-tazobactam (ZOSYN) IVPB 3.375 g  Status:  Discontinued        3.375 g 12.5 mL/hr over 240 Minutes Intravenous Every 8 hours 11/11/11 1928 11/21/11 1017   11/11/11 1130  piperacillin-tazobactam (ZOSYN) IVPB 3.375 g       3.375 g 100 mL/hr over 30 Minutes Intravenous  Once 11/11/11 1050 11/11/11 1200   11/11/11 1130   vancomycin (VANCOCIN) IVPB 1000 mg/200 mL premix        1,000 mg 200 mL/hr over 60 Minutes Intravenous  Once 11/11/11 1050 11/11/11 1341          Assessment/Plan: s/p Procedure(s): EXPLORATORY LAPAROTOMY GASTROSTOMY INSERTION OF MESH APPLICATION OF WOUND VAC Appears very stable.  Continue current care. OK to transfer to floor if CCM agrees. Continue TNA and go slow on diet.     LOS: 14 days    Abir Eroh T 11/25/2011

## 2011-11-25 NOTE — Progress Notes (Signed)
Received patient from Stepdown, alert and oriented, no distress noted, Midline incision to woundvac intact.PICC line on R arm TNA infusing .Husband at bedside. Spoke to wound nurse Jacki Cones stated she will be coming tomorrow to change the woundvac dressing.

## 2011-11-25 NOTE — Progress Notes (Addendum)
Physical Therapy Treatment Patient Details Name: [name] MRN: [id number] DOB: December 30, 1933 Today's Date: 11/25/2011 Time: 1020-1101 PT Time Calculation (min): 41 min  PT Assessment / Plan / Recommendation Comments on Treatment Session  pt is progressing well with PT, does not want to go to medical floor and leave SDU; recommend STSNF vs LTACH  pt on air cushion in chair  Follow Up Recommendations  Skilled nursing facility;LTACH    Barriers to Discharge        Equipment Recommendations  Defer to next venue    Recommendations for Other Services    Frequency Min 3X/week   Plan Discharge plan remains appropriate;Frequency remains appropriate    Precautions / Restrictions Precautions Precautions: Fall Precaution Comments: pt extremely fixated on the SARA lift and using that to get OOB eventhough she was able to stand today   Pertinent Vitals/Pain VSS Pt on RA     Mobility  Bed Mobility Bed Mobility: Rolling Left;Left Sidelying to Sit Rolling Left: 4: Min assist Left Sidelying to Sit: 4: Min assist Details for Bed Mobility Assistance: cues for sequence, technique; 2nd person to assist in managing lines Transfers Transfers: Sit to Stand;Stand to Sit Sit to Stand: 1: +2 Total assist;From bed;From elevated surface;With upper extremity assist Sit to Stand: Patient Percentage: 60% Stand to Sit: 1: +2 Total assist;To chair/3-in-1 Stand to Sit: Patient Percentage: 60% Stand Pivot Transfers: 1: +2 Total assist Stand Pivot Transfers: Patient Percentage: 70% Details for Transfer Assistance: pt displeased that PT would not use SARA today; encouraged pt that pivoting with RW would be more beneficial for strengthening and  reassured her regarding VAC and secureness of wound closure with that in place; pt able to stand support wt on LEs wtih less assist today Ambulation/Gait General Gait Details: stand pivot with Rw bed to chair    Exercises     PT Diagnosis:    PT Problem List:   PT  Treatment Interventions:     PT Goals Acute Rehab PT Goals Time For Goal Achievement: 12/02/11 Potential to Achieve Goals: Fair Pt will go Sit to Stand: with supervision PT Goal: Sit to Stand - Progress: Progressing toward goal Pt will go Stand to Sit: with supervision PT Goal: Stand to Sit - Progress: Progressing toward goal  Visit Information  Last PT Received On: 11/25/11 Assistance Needed: +2    Subjective Data  Subjective: I can't do it. I need the SARA! Patient Stated Goal: to not die   Cognition  Overall Cognitive Status: Appears within functional limits for tasks assessed/performed Arousal/Alertness: Awake/alert Orientation Level: Appears intact for tasks assessed;Oriented X4 / Intact Behavior During Session: [facility] for tasks performed    Balance     End of Session PT - End of Session Activity Tolerance: Patient tolerated treatment well Patient left: in chair;with call bell/phone within reach Nurse Communication: Mobility status    [facility] 11/25/2011, 11:36 AM

## 2011-11-25 NOTE — Progress Notes (Signed)
    Subjective:  Lots of anxiety and difficulty breathing. No chest pain. Concerned about changing hospital units.  Objective:  Vital Signs in the last 24 hours: Temp:  [97.9 F (36.6 C)-99.1 F (37.3 C)] 97.9 F (36.6 C) (06/07 0831) Pulse Rate:  [77-93] 77  (06/07 0400) Resp:  [17-21] 17  (06/07 0400) BP: (124-134)/(50-66) 124/50 mmHg (06/07 0400) SpO2:  [93 %-100 %] 96 % (06/07 0400) FiO2 (%):  [21 %] 21 % (06/07 0017) Weight:  [80.1 kg (176 lb 9.4 oz)] 80.1 kg (176 lb 9.4 oz) (06/07 0006)  Intake/Output from previous day: 06/06 0701 - 06/07 0700 In: 2728 [P.O.:360; I.V.:460; IV Piggyback:68; TPN:1840] Out: 3750 [Urine:3750]  Physical Exam: Pt is alert and oriented, chronically ill-appearing woman in NAD HEENT: normal Neck: JVP - normal Lungs: coarse bilateral with diffuse rhonchi CV: RRR without murmur or gallop Abd: soft Ext: no edema Skin: warm/dry no rash   Lab Results:  Basename 11/25/11 0421 11/24/11 0400  WBC 15.8* 17.0*  HGB 8.5* 9.0*  PLT 246 275    Basename 11/25/11 0421 11/24/11 0400  NA 131* 133*  K 3.1* 3.6  CL 94* 98  CO2 32 30  GLUCOSE 116* 104*  BUN 28* 28*  CREATININE 0.84 0.82   No results found for this basename: TROPONINI:2,CK,MB:2 in the last 72 hours  Tele: sinus rhythm  Assessment/Plan:  1. PAF - she is maintaining sinus rhythm and is paced. Stable on tele, resume warfarin when taking PO.  2. Acute on chronic systolic heart failure. Has diuresed well over the past 48 hours. Would aim for even I/O's at this point. Also on IV metoprolol. Switch back to Toprol XL when taking PO. I would hold on ACE/ARB in setting of this hospitalization as I think risk of acute renal insult outweighs benefit.  3. Hypokalemia - replete K  Will continue to follow from a distance as overall cardiac situation seems stable.  [name], M.D. 11/25/2011, 9:00 AM

## 2011-11-26 LAB — GLUCOSE, CAPILLARY: Glucose-Capillary: 76 mg/dL (ref 70–99)

## 2011-11-26 LAB — CBC
MCHC: 31.5 g/dL (ref 30.0–36.0)
RDW: 14.4 % (ref 11.5–15.5)
WBC: 15 10*3/uL — ABNORMAL HIGH (ref 4.0–10.5)

## 2011-11-26 LAB — BASIC METABOLIC PANEL
GFR calc Af Amer: 79 mL/min — ABNORMAL LOW (ref >90)
GFR calc non Af Amer: 68 mL/min — ABNORMAL LOW (ref >90)
Potassium: 3.4 mEq/L — ABNORMAL LOW (ref 3.5–5.1)
Sodium: 131 mEq/L — ABNORMAL LOW (ref 135–145)

## 2011-11-26 LAB — MAGNESIUM: Magnesium: 1.9 mg/dL (ref 1.5–2.5)

## 2011-11-26 LAB — PHOSPHORUS: Phosphorus: 2.3 mg/dL (ref 2.3–4.6)

## 2011-11-26 MED ORDER — FLUCONAZOLE 200 MG PO TABS
200.0000 mg | ORAL_TABLET | Freq: Once | ORAL | Status: AC
  Administered 2011-11-26: 200 mg via ORAL
  Filled 2011-11-26: qty 1

## 2011-11-26 MED ORDER — POTASSIUM CHLORIDE 10 MEQ/100ML IV SOLN
10.0000 meq | INTRAVENOUS | Status: AC
  Administered 2011-11-26 (×5): 10 meq via INTRAVENOUS
  Filled 2011-11-26 (×5): qty 100

## 2011-11-26 MED ORDER — CLINIMIX E/DEXTROSE (5/15) 5 % IV SOLN
INTRAVENOUS | Status: AC
  Administered 2011-11-26: 18:00:00 via INTRAVENOUS
  Filled 2011-11-26: qty 2000

## 2011-11-26 NOTE — Progress Notes (Signed)
PARENTERAL NUTRITION CONSULT NOTE  Pharmacy Consult for TNA Indication: Sigmoid Rupture/Colostomy  Allergies  Allergen Reactions  . Aspirin   . Ibuprofen   . Iron Dextran     REACTION: itching after test dose  . Metoclopramide Hcl     REACTION: chomping mouth  . Sulfonamide Derivatives    Patient Measurements: Height: 5\' 3"  (160 cm) Weight: 176 lb 9.4 oz (80.1 kg) IBW/kg (Calculated) : 52.4  Adjusted Body Weight: 62kg Usual Weight: 74kg  Vital Signs: Temp: 99.4 F (37.4 C) (06/08 0510) Temp src: Oral (06/08 0510) BP: 141/72 mmHg (06/08 0510) Pulse Rate: 84  (06/08 0510) Intake/Output from previous day: 06/07 0701 - 06/08 0700 In: 990 [I.V.:220; TPN:770] Out: 3700 [Urine:3700]  Labs:  [facility] 11/26/11 0416 11/25/11 0421 11/24/11 0400  WBC 15.0* 15.8* 17.0*  HGB 8.5* 8.5* 9.0*  HCT 27.0* 26.6* 28.7*  PLT 255 246 275  APTT -- -- --  INR -- -- --     Basename 11/26/11 0416 11/25/11 0421 11/24/11 0400  NA 131* 131* 133*  K 3.4* 3.1* 3.6  CL 93* 94* 98  CO2 30 32 30  GLUCOSE 139* 116* 104*  BUN 28* 28* 28*  CREATININE 0.82 0.84 0.82  LABCREA -- -- --  CREAT24HRUR -- -- --  CALCIUM 8.7 8.6 8.6  MG 1.9 1.9 2.1  PHOS 2.3 2.7 2.5  PROT -- -- 5.2*  ALBUMIN -- -- 1.9*  AST -- -- 14  ALT -- -- 11  ALKPHOS -- -- 55  BILITOT -- -- 0.2*  BILIDIR -- -- --  IBILI -- -- --  PREALBUMIN -- -- --  TRIG -- -- --  CHOLHDL -- -- --  CHOL -- -- --   Estimated Creatinine Clearance: 59.4 ml/min (by C-G formula based on Cr of 0.82).    Basename 11/26/11 0611 11/25/11 2216 11/25/11 1709  GLUCAP 124* 107* 105*    Medications:  Scheduled:     . albuterol-ipratropium  2 puff Inhalation Q4H  . antiseptic oral rinse  15 mL Mouth Rinse QID  . chlorhexidine  15 mL Mouth Rinse BID  . diphenhydrAMINE      . DULoxetine  30 mg Oral BID  . furosemide  40 mg Intravenous BID  . heparin subcutaneous  5,000 Units Subcutaneous Q8H  . hydrocortisone sodium succinate  25 mg  Intravenous Q12H  . insulin aspart  0-9 Units Subcutaneous Q8H  . levothyroxine  26 mcg Intravenous QAC breakfast  . metoprolol  5 mg Intravenous Q6H  . pantoprazole (PROTONIX) IV  40 mg Intravenous Q24H  . potassium chloride  10 mEq Intravenous Q1 Hr x 5  . sodium chloride  10-40 mL Intracatheter Q12H    Insulin Requirements in the past 24 hours:  CBG's 98-124 Sensitive SSI - 1 unit   Current Nutrition:  Clear Liquid Diet (started 6/6) Clinimix 5/15 at 70 ml/hr + Lipids MWF IVF: D5 0.45NaCl 20KCl at [facility]  Nutritional Goals:  Per RD 6/2 note: 1400-1500 Kcal and 75-85 grams protein/day.  TPN to meet > 90% estimated needs.    Clinimix 5/15 at goal rate 70 ml/hr will provide on average 1398 Kcal and 84 grams protein/day (1672 kcal/day with lipids, 1193 kcal/day without)  Assessment:  78 yo Female with perforated diverticular abscess; resection, abscess drainage 5/25. Underwent wound closure/mesh 6/2.   At risk for re-feeding syndrome with prolonged NPO.  Avoid volume overload with HF.  No history of DM, on Hydrocortisone IV, and chronic steroid use PTA  for COPD.  PO intake (CLD) - no PO intake recorded 6/7  GI PPx: Protonix IV Electrolytes/Labs:  Na low and trending down, but unable to adjust Na-content in Clinimix product.  K low again - will order IV K-runs this am (note pt also on Lasix BID)  Mg and Phos wnl  AST/ALT WNL (6/6)  Cholesterol and TG WNL on 6/3  Pre-Albumin 10.4 on 6/3, Albumin low at 1.9 (6/6)  CBG's WNL  Plan: At 1800 today  KCl IV x 5 runs this am  BMET in am due to refeeding risk - f/u K+  Continue Clinimix E 5/15 at goal rate 70 ml/hr  Fat emulsion at 10 ml/hr (MWF only due to ongoing shortage).  TNA to contain standard multivitamins and trace elements (MWF only due to ongoing shortage).  Continue CBGs and sensitive slide scale insulin at q8h  Follow up diet and PO intake   TNA lab panels on Mondays & Thursdays.  F/u  daily.  Darrol Angel, PharmD Pager: [phone] 11/26/2011 7:44 AM

## 2011-11-26 NOTE — Progress Notes (Signed)
Patient ID: [name], female   DOB: [birth year], 77 y.o.   MRN: [id number] 7 Days Post-Op  Subjective: No major C/O. Some upper airway conjestion  Objective: Vital signs in last 24 hours: Temp:  [98.4 F (36.9 C)-99.4 F (37.4 C)] 99.4 F (37.4 C) (06/08 0510) Pulse Rate:  [81-95] 84  (06/08 0510) Resp:  [18-20] 18  (06/08 0510) BP: (126-146)/(51-72) 141/72 mmHg (06/08 0510) SpO2:  [93 %-97 %] 93 % (06/08 0815) FiO2 (%):  [21 %] 21 % (06/07 1951) Last BM Date: 11/24/11 (small amt in bag earlier today, gas now)  Intake/Output from previous day: 06/07 0701 - 06/08 0700 In: 2070 [I.V.:460; TPN:1610] Out: 3700 [Urine:3700] Intake/Output this shift:    General appearance: alert and mild distress GI: abnormal findings:  mild tenderness in the lower abdomen Incision/Wound: VAC in place and intact Skin: rash perineum and beneath pannus  Lab Results:   Basename 11/26/11 0416 11/25/11 0421  WBC 15.0* 15.8*  HGB 8.5* 8.5*  HCT 27.0* 26.6*  PLT 255 246   BMET  Basename 11/26/11 0416 11/25/11 0421  NA 131* 131*  K 3.4* 3.1*  CL 93* 94*  CO2 30 32  GLUCOSE 139* 116*  BUN 28* 28*  CREATININE 0.82 0.84  CALCIUM 8.7 8.6     Studies/Results: No results found.  Anti-infectives: Anti-infectives     Start     Dose/Rate Route Frequency Ordered Stop   11/11/11 2000   piperacillin-tazobactam (ZOSYN) IVPB 3.375 g  Status:  Discontinued        3.375 g 12.5 mL/hr over 240 Minutes Intravenous Every 8 hours 11/11/11 1928 11/21/11 1017   11/11/11 1130  piperacillin-tazobactam (ZOSYN) IVPB 3.375 g       3.375 g 100 mL/hr over 30 Minutes Intravenous  Once 11/11/11 1050 11/11/11 1200   11/11/11 1130   vancomycin (VANCOCIN) IVPB 1000 mg/200 mL premix        1,000 mg 200 mL/hr over 60 Minutes Intravenous  Once 11/11/11 1050 11/11/11 1341          Assessment/Plan: s/p Procedure(s): EXPLORATORY LAPAROTOMY GASTROSTOMY INSERTION OF MESH APPLICATION OF WOUND VAC Overall slow  continued progress Prob monilial rash-Diflucan Advance to FL diet For VAC change today   LOS: 15 days    Dortha Neighbors T 11/26/2011

## 2011-11-26 NOTE — Consult Note (Addendum)
WOC consult Note WOC Follow up visit for NPWT dressing change:  White foam placed over biologic mesh, black foam (3 pieces) placed over healing surgical wound.  Gauze placed over 3 retention sutures.  Seal achieved, pressure, continuous, without discomfort to patient.  Ostomy pouching system changed. I will follow.  Next dressing change scheduled for Monday,6/10. Thanks, Ladona Mow, MSN, RN, [facility], CWOCN [phone])

## 2011-11-26 NOTE — Progress Notes (Signed)
Name: [name] MRN: [id number] DOB: [birth year]    LOS: 15  Referring Provider:  EDP Reason for Referral:  shock  PULMONARY / CRITICAL CARE MEDICINE  BRIEF PROFILE:  84 yowf with steroid dep obstructive lung disease, CHF, CAF, divverticulitis adm by PCCM via [facility] am 5/24 with increased abd pain and hypotension. CT abd c/w free air and divertular abscess. Ex-lap 5/24 PM revealed perforated sigmoid diverticulitis. Underwent sigmoid colon resection and abscess drainage (Dr Biagio Quint). Remained intubated post op  SUBJECTIVE:  6/1- wound dehiscence, back to OR, post op resp failure 6/4 wants to remain in bed for feeling bad. 6/5 wants to get up 6/5 rt picc line placed. 6/6 more awake. 6/7 adamant about not going to floor.  Subjective/ overnight: Bothered by harsh cough, rarely productive. Similar to home pattern. Discussed options. Nurse here, confirms ok now with po meds/ liquids, but we agreed to avoid narcotics. Appreciate Dr Jamse Mead notes.  Vital Signs: Temp:  [98.4 F (36.9 C)-99.4 F (37.4 C)] 99.4 F (37.4 C) (06/08 0510) Pulse Rate:  [81-95] 88  (06/08 1200) Resp:  [18-20] 18  (06/08 0510) BP: (126-150)/(51-79) 150/79 mmHg (06/08 1200) SpO2:  [93 %-97 %] 93 % (06/08 1157) FiO2 (%):  [21 %] 21 % (06/07 1951) 4 liters  Physical Examination: HEENT: WNL Chest: coarse. +congested cough Heart: s1s2 no murmur Abd: No BS, wound vac, with open abd wound, colostomy with scant drainage, more +gas Ext: warm, no edema Neuro: no focal deficits, follows commands  Active Problems:  HYPOTHYROIDISM  Atrial fibrillation  HEART FAILURE  History of hypertension  Respiratory failure following trauma and surgery  Chronic obstructive asthma  Perforated diverticulitis  Peritonitis  Hypotension  Acute respiratory failure   ASSESSMENT AND PLAN  PULMONARY Acute respiratory Failure, post op 6/1 COPD/fixed asthma, (Xolair, Singulair, Pred (7.5) are chronic meds). plan No results  found. -wean O2 as tolerated -pulmonary toilet -solu cortef decreased 6/3 , would not go lower than 25 mg Q 12 h -pt as tolerated  CARDIOVASCULAR Cardiology consult 5/24   A: Afib w/ RVR, chronic systolic CHF (EF 95-62%) P:  -Per cards -Amiodarone remains on hold as of 6/7. Gut now availabe. Cards to decide if Amio to be restarted Stress steroids remain, lower 6/3 , need to reduce when able with wound healing but patient is chronically on steroids.  Already at 50 q12 of hydrocortisone, will decrease to 25 q 12. 6/4  RENAL  Lab 11/26/11 0416 11/25/11 0421 11/24/11 0400 11/23/11 0500 11/22/11 0510  NA 131* 131* 133* 134* 136  K 3.4* 3.1* -- -- --  CL 93* 94* 98 98 101  CO2 30 32 30 28 27   BUN 28* 28* 28* 24* 25*  CREATININE 0.82 0.84 0.82 0.81 0.91  CALCIUM 8.7 8.6 8.6 8.8 8.6  MG 1.9 1.9 2.1 1.9 2.0  PHOS 2.3 2.7 2.5 2.2* 2.5   Intake/Output      06/07 0701 - 06/08 0700 06/08 0701 - 06/09 0700   P.O.     I.V. (mL/kg) 460 (5.7)    IV Piggyback     TPN 1610    Total Intake(mL/kg) 2070 (25.8)    Urine (mL/kg/hr) 3700 (1.9) 1200 (2.9)   Total Output 3700 1200   Net -1630 -1200          Intake/Output Summary (Last 24 hours) at 11/26/11 1214 Last data filed at 11/26/11 1200  Gross per 24 hour  Intake   1530 ml  Output  4900 ml  Net  -3370 ml   Foley:  In ER 5/24  A:  AKI / ATN, resolved. Hypokalemia P:   -CBC was hemoconcentration -Assess cvp, Q shift 6/3 -Even balance goal, reduction slight -replete K+  GASTROINTESTINAL  Lab 11/24/11 0400 11/21/11 0415 11/19/11 1625  AST 14 12 18   ALT 11 12 15   ALKPHOS 55 48 40  BILITOT 0.2* 0.3 0.5  PROT 5.2* 4.9* 4.5*  ALBUMIN 1.9* 1.8* 2.0*   Diet : full liquids 6/6  A:  Post op ileus, resolving 6/7 - nutritional status an issue, tpn started -lft ppi  HEMATOLOGIC  Lab 11/26/11 0416 11/25/11 0421 11/24/11 0400 11/23/11 0500 11/22/11 0510  HGB 8.5* 8.5* 9.0* 8.8* 8.7*  HCT 27.0* 26.6* 28.7* 27.9* 27.7*  PLT  255 246 275 279 262  INR -- -- -- -- --  APTT -- -- -- -- --   A:  Coagulopathic from coumadin With lowostomy output unsure if I would start coumadin now as of 6/7  INFECTIOUS  Lab 11/26/11 0416 11/25/11 0421 11/24/11 0400 11/23/11 0500 11/22/11 0510  WBC 15.0* 15.8* 17.0* 18.0* 19.2*  PROCALCITON -- -- -- -- --   Cultures: UC 5/24 >>15K mult orgs.  BC x2 5/24 >>neg  Antibiotics: Vancomycin  5/24 only Zosyn 5/24 >>6/3  A:  Diverticular abscess due to perforated divertic/ low grade sepsis  6/1- wound dehiscence P:  Zosyn . DC 6/3  ENDOCRINE  A:  Hypothyroidism Plan: -synthroid iv  added 6/3 and check tsh(1.017) NEUROLOGIC  A:  Dementia dx pre op>>normal mental status 5/27. Alert and coperative 6/4  P:  -prn fent, versed Upright important, now abdo muscle issue  Global: 6/5 working on on LTAC placement. 6/6 spoke with Dr Johna Sheriff 6/6 . No Ltac until wound vac has been changed and stable. CCS will let PCCM know when that is. 6/7 pt does not want to go to floor, therefore remain in SDU.   Continue to look for LTAC after wound vac is changed.    Waymon Budge, M.D. [phone]

## 2011-11-27 LAB — BASIC METABOLIC PANEL
BUN: 31 mg/dL — ABNORMAL HIGH (ref 6–23)
Chloride: 94 mEq/L — ABNORMAL LOW (ref 96–112)
GFR calc non Af Amer: 61 mL/min — ABNORMAL LOW (ref >90)
Glucose, Bld: 135 mg/dL — ABNORMAL HIGH (ref 70–99)
Potassium: 4 mEq/L (ref 3.5–5.1)
Sodium: 132 mEq/L — ABNORMAL LOW (ref 135–145)

## 2011-11-27 LAB — GLUCOSE, CAPILLARY
Glucose-Capillary: 121 mg/dL — ABNORMAL HIGH (ref 70–99)
Glucose-Capillary: 151 mg/dL — ABNORMAL HIGH (ref 70–99)

## 2011-11-27 MED ORDER — GUAIFENESIN ER 600 MG PO TB12
600.0000 mg | ORAL_TABLET | Freq: Two times a day (BID) | ORAL | Status: DC
  Administered 2011-11-27 – 2011-12-02 (×11): 600 mg via ORAL
  Filled 2011-11-27 (×12): qty 1

## 2011-11-27 MED ORDER — CLINIMIX E/DEXTROSE (5/15) 5 % IV SOLN
INTRAVENOUS | Status: AC
  Administered 2011-11-27: 18:00:00 via INTRAVENOUS
  Filled 2011-11-27: qty 2000

## 2011-11-27 NOTE — Progress Notes (Signed)
Name: [name] MRN: [id number] DOB: [birth year]    LOS: 16  Referring Provider:  EDP Reason for Referral:  shock  PULMONARY / CRITICAL CARE MEDICINE  BRIEF PROFILE:  13 yowf with steroid dep obstructive lung disease, CHF, CAF, divverticulitis adm by PCCM via [facility] am 5/24 with increased abd pain and hypotension. CT abd c/w free air and divertular abscess. Ex-lap 5/24 PM revealed perforated sigmoid diverticulitis. Underwent sigmoid colon resection and abscess drainage (Dr Biagio Quint). Remained intubated post op  SUBJECTIVE:  6/1- wound dehiscence, back to OR, post op resp failure 6/4 wants to remain in bed for feeling bad. 6/5 wants to get up 6/5 rt picc line placed. 6/6 more awake. 6/7 adamant about not going to floor.  Subjective/ overnight: Bothered by harsh cough, rarely productive. Similar to home pattern. Discussed options.  Discussed her Raynaud's. Unusual in that she prefers cool room to reduce diaphoresis. Vital Signs: Temp:  [95.8 F (35.4 C)-98.5 F (36.9 C)] 98.3 F (36.8 C) (06/09 0710) Pulse Rate:  [82-89] 82  (06/09 0710) Resp:  [20] 20  (06/09 0710) BP: (113-150)/(71-83) 124/72 mmHg (06/09 0710) SpO2:  [93 %-98 %] 98 % (06/09 0755) Weight:  [81.285 kg (179 lb 3.2 oz)] 81.285 kg (179 lb 3.2 oz) (06/09 0559) 4 liters  Physical Examination: HEENT: WNL Chest: coarse. +congested cough Heart: s1s2 no murmur Abd: No BS, wound vac, with open abd wound, colostomy with scant drainage, more +gas Ext: warm, no edema. Surgical absence right ring finger. Neuro: no focal deficits, follows commands  Active Problems:  HYPOTHYROIDISM  Atrial fibrillation  HEART FAILURE  History of hypertension  Respiratory failure following trauma and surgery  Chronic obstructive asthma  Perforated diverticulitis  Peritonitis  Hypotension  Acute respiratory failure   ASSESSMENT AND PLAN  PULMONARY Acute respiratory Failure, post op 6/1 COPD/fixed asthma, (Xolair, Singulair, Pred  (7.5) are chronic meds). plan No results found. -wean O2 as tolerated- staying most;ly on room air. Cool hands read low on oximeter. -pulmonary toilet -solu cortef decreased 6/3 , would not go lower than 25 mg Q 12 h -pt as tolerated -adding mucinex. Has Flutter and Incentive spirometer- not very effective  CARDIOVASCULAR Cardiology consult 5/24   A: Afib w/ RVR, chronic systolic CHF (EF 82-95%) P:  -Per cards- Dr Marvel Plan note appreciated. -Amiodarone remains on hold as of 6/7. Gut now availabe. Cards to decide if Amio to be restarted Stress steroids remain, lower 6/3 , need to reduce when able with wound healing but patient is chronically on steroids.  Already at 50 q12 of hydrocortisone, will decrease to 25 q 12. 6/4  RENAL  Lab 11/27/11 0400 11/26/11 0416 11/25/11 0421 11/24/11 0400 11/23/11 0500 11/22/11 0510  NA 132* 131* 131* 133* 134* --  K 4.0 3.4* -- -- -- --  CL 94* 93* 94* 98 98 --  CO2 30 30 32 30 28 --  BUN 31* 28* 28* 28* 24* --  CREATININE 0.90 0.82 0.84 0.82 0.81 --  CALCIUM 8.9 8.7 8.6 8.6 8.8 --  MG -- 1.9 1.9 2.1 1.9 2.0  PHOS -- 2.3 2.7 2.5 2.2* 2.5   Intake/Output      06/08 0701 - 06/09 0700 06/09 0701 - 06/10 0700   P.O. 90    I.V. (mL/kg) 240 (3)    TPN 840    Total Intake(mL/kg) 1170 (14.4)    Urine (mL/kg/hr) 2950 (1.5) 200 (0.5)   Stool  500   Total Output 2950 700  Net -1780 -700          Intake/Output Summary (Last 24 hours) at 11/27/11 1132 Last data filed at 11/27/11 0711  Gross per 24 hour  Intake   1170 ml  Output   3650 ml  Net  -2480 ml   Foley:  In ER 5/24  A:  AKI / ATN, resolved. Hypokalemia P:   -CBC was hemoconcentration -Assess cvp, Q shift 6/3 -Even balance goal, reduction slight -replete K+  GASTROINTESTINAL  Lab 11/24/11 0400 11/21/11 0415  AST 14 12  ALT 11 12  ALKPHOS 55 48  BILITOT 0.2* 0.3  PROT 5.2* 4.9*  ALBUMIN 1.9* 1.8*   Diet : full liquids 6/6  A:  Post op ileus, resolving 6/7 -  nutritional status an issue, tpn started -lft ppi  HEMATOLOGIC  Lab 11/26/11 0416 11/25/11 0421 11/24/11 0400 11/23/11 0500 11/22/11 0510  HGB 8.5* 8.5* 9.0* 8.8* 8.7*  HCT 27.0* 26.6* 28.7* 27.9* 27.7*  PLT 255 246 275 279 262  INR -- -- -- -- --  APTT -- -- -- -- --   A:  Coagulopathic from coumadin With lowostomy output unsure if I would start coumadin now as of 6/7  INFECTIOUS  Lab 11/26/11 0416 11/25/11 0421 11/24/11 0400 11/23/11 0500 11/22/11 0510  WBC 15.0* 15.8* 17.0* 18.0* 19.2*  PROCALCITON -- -- -- -- --   Cultures: UC 5/24 >>15K mult orgs.  BC x2 5/24 >>neg  Antibiotics: Vancomycin  5/24 only Zosyn 5/24 >>6/3  A:  Diverticular abscess due to perforated divertic/ low grade sepsis  6/1- wound dehiscence P:  Zosyn . DC 6/3  ENDOCRINE  A:  Hypothyroidism Plan: -synthroid iv  added 6/3 and check tsh(1.017) NEUROLOGIC  A:  Dementia dx pre op>>normal mental status 5/27. Alert and coperative 6/4  P:  -prn fent, versed Upright important, now abdo muscle issue  Global: 6/5 working on on LTAC placement. 6/6 spoke with Dr Johna Sheriff 6/6 . No Ltac until wound vac has been changed and stable. CCS will let PCCM know when that is. 6/7 pt does not want to go to floor, therefore remain in SDU.   Continue to look for LTAC after wound vac is changed.    Waymon Budge, M.D. [phone]

## 2011-11-27 NOTE — Progress Notes (Signed)
PARENTERAL NUTRITION CONSULT NOTE  Pharmacy Consult for TNA Indication: Sigmoid Rupture/Colostomy  Allergies  Allergen Reactions  . Aspirin   . Ibuprofen   . Iron Dextran     REACTION: itching after test dose  . Metoclopramide Hcl     REACTION: chomping mouth  . Sulfonamide Derivatives    Patient Measurements: Height: 5\' 3"  (160 cm) Weight: 179 lb 3.2 oz (81.285 kg) IBW/kg (Calculated) : 52.4  Adjusted Body Weight: 62kg Usual Weight: 74kg  Vital Signs: Temp: 98.3 F (36.8 C) (06/09 0710) Temp src: Oral (06/09 0710) BP: 124/72 mmHg (06/09 0710) Pulse Rate: 82  (06/09 0710) Intake/Output from previous day: 06/08 0701 - 06/09 0700 In: 90 [P.O.:90] Out: 2950 [Urine:2950]  Labs:  [facility] 11/26/11 0416 11/25/11 0421  WBC 15.0* 15.8*  HGB 8.5* 8.5*  HCT 27.0* 26.6*  PLT 255 246  APTT -- --  INR -- --     Basename 11/27/11 0400 11/26/11 0416 11/25/11 0421  NA 132* 131* 131*  K 4.0 3.4* 3.1*  CL 94* 93* 94*  CO2 30 30 32  GLUCOSE 135* 139* 116*  BUN 31* 28* 28*  CREATININE 0.90 0.82 0.84  LABCREA -- -- --  CREAT24HRUR -- -- --  CALCIUM 8.9 8.7 8.6  MG -- 1.9 1.9  PHOS -- 2.3 2.7  PROT -- -- --  ALBUMIN -- -- --  AST -- -- --  ALT -- -- --  ALKPHOS -- -- --  BILITOT -- -- --  BILIDIR -- -- --  IBILI -- -- --  PREALBUMIN -- -- --  TRIG -- -- --  CHOLHDL -- -- --  CHOL -- -- --   Estimated Creatinine Clearance: 54.6 ml/min (by C-G formula based on Cr of 0.9).    Basename 11/27/11 0609 11/26/11 2213 11/26/11 1417  GLUCAP 121* 94 76    Medications:  Scheduled:     . albuterol-ipratropium  2 puff Inhalation Q4H  . antiseptic oral rinse  15 mL Mouth Rinse QID  . chlorhexidine  15 mL Mouth Rinse BID  . DULoxetine  30 mg Oral BID  . fluconazole  200 mg Oral Once  . furosemide  40 mg Intravenous BID  . heparin subcutaneous  5,000 Units Subcutaneous Q8H  . hydrocortisone sodium succinate  25 mg Intravenous Q12H  . insulin aspart  0-9 Units  Subcutaneous Q8H  . levothyroxine  26 mcg Intravenous QAC breakfast  . metoprolol  5 mg Intravenous Q6H  . pantoprazole (PROTONIX) IV  40 mg Intravenous Q24H  . potassium chloride  10 mEq Intravenous Q1 Hr x 5  . sodium chloride  10-40 mL Intracatheter Q12H    Insulin Requirements in the past 24 hours:  CBG's 76-124 Sensitive SSI - 1 unit   Current Nutrition:  Clear Liquid Diet (started 6/6) --> advanced to FLD 6/8 Clinimix 5/15 at 70 ml/hr + Lipids MWF IVF: D5 0.45NaCl 20KCl at [facility]  Nutritional Goals:  Per RD 6/2 note: 1400-1500 Kcal and 75-85 grams protein/day.  TPN to meet > 90% estimated needs.    Clinimix 5/15 at goal rate 70 ml/hr will provide on average 1398 Kcal and 84 grams protein/day (1672 kcal/day with lipids, 1193 kcal/day without)  Assessment:  78 yo Female with perforated diverticular abscess; resection, abscess drainage 5/25. Underwent wound closure/mesh 6/2.   At risk for re-feeding syndrome with prolonged NPO.  Avoid volume overload with HF.  No history of DM, on Hydrocortisone IV, and chronic steroid use PTA for COPD.  PO intake (advanced FLD) - 90 ml recorded 6/8  GI PPx: Protonix IV Electrolytes/Labs:  Na low - unable to adjust Na-content in Clinimix product.  K wnl now  Mg and Phos wnl on 6/8  AST/ALT WNL (6/6)  Cholesterol and TG WNL on 6/3  Pre-Albumin 10.4 on 6/3, Albumin low at 1.9 (6/6)  CBG's WNL  Plan: At 1800 today  Continue Clinimix E 5/15 at goal rate 70 ml/hr  Fat emulsion at 10 ml/hr (MWF only due to ongoing shortage).  TNA to contain standard multivitamins and trace elements (MWF only due to ongoing shortage).  Continue CBGs and sensitive slide scale insulin at q8h  Follow up diet and PO intake   TNA lab panels on Mondays & Thursdays.  F/u daily.  Darrol Angel, PharmD Pager: [phone] 11/27/2011 7:47 AM

## 2011-11-27 NOTE — Progress Notes (Signed)
Patient ID: [name], female   DOB: October 18, 1933, 78 y.o.   MRN: [id number] 8 Days Post-Op  Subjective: No C/O today.  Tol FL well. Feels stronger.  Still some upper chest conjestion, improving  Objective: Vital signs in last 24 hours: Temp:  [95.8 F (35.4 C)-98.5 F (36.9 C)] 98.3 F (36.8 C) (06/09 0710) Pulse Rate:  [82-89] 82  (06/09 0710) Resp:  [20] 20  (06/09 0710) BP: (113-150)/(71-83) 124/72 mmHg (06/09 0710) SpO2:  [93 %-98 %] 98 % (06/09 0755) Weight:  [179 lb 3.2 oz (81.285 kg)] 179 lb 3.2 oz (81.285 kg) (06/09 0559) Last BM Date:  (colostomy)  Intake/Output from previous day: 06/08 0701 - 06/09 0700 In: 1170 [P.O.:90; I.V.:240; TPN:840] Out: 2950 [Urine:2950] Intake/Output this shift: Total I/O In: -  Out: 700 [Urine:200; Stool:500]  General appearance: alert and no distress GI: normal findings: soft, non-tender, mild distion Incision/Wound: VAC in place  Lab Results:   Basename 11/26/11 0416 11/25/11 0421  WBC 15.0* 15.8*  HGB 8.5* 8.5*  HCT 27.0* 26.6*  PLT 255 246   BMET  Basename 11/27/11 0400 11/26/11 0416  NA 132* 131*  K 4.0 3.4*  CL 94* 93*  CO2 30 30  GLUCOSE 135* 139*  BUN 31* 28*  CREATININE 0.90 0.82  CALCIUM 8.9 8.7     Studies/Results: No results found.  Anti-infectives: Anti-infectives     Start     Dose/Rate Route Frequency Ordered Stop   11/26/11 1130   fluconazole (DIFLUCAN) tablet 200 mg        200 mg Oral  Once 11/26/11 1129 11/26/11 1200   11/11/11 2000   piperacillin-tazobactam (ZOSYN) IVPB 3.375 g  Status:  Discontinued        3.375 g 12.5 mL/hr over 240 Minutes Intravenous Every 8 hours 11/11/11 1928 11/21/11 1017   11/11/11 1130  piperacillin-tazobactam (ZOSYN) IVPB 3.375 g       3.375 g 100 mL/hr over 30 Minutes Intravenous  Once 11/11/11 1050 11/11/11 1200   11/11/11 1130   vancomycin (VANCOCIN) IVPB 1000 mg/200 mL premix        1,000 mg 200 mL/hr over 60 Minutes Intravenous  Once 11/11/11 1050 11/11/11  1341          Assessment/Plan: s/p Procedure(s): EXPLORATORY LAPAROTOMY GASTROSTOMY INSERTION OF MESH APPLICATION OF WOUND VAC Continued progress.  Wants to stay on FL for now.  Cont IV abx for now VAC change tomorrow   LOS: 16 days    Auren Valdes T 11/27/2011

## 2011-11-27 NOTE — Progress Notes (Signed)
[name]  78 y.o.  female  Subjective: She continues to experience congestion and some dyspnea. Denies chest discomfort or sputum production. She has a history of asthma and is experiencing some bronchospasm. She remains weak with difficulty moving about. She is tolerating a full liquid diet.  Allergy: Aspirin; Ibuprofen; Iron dextran; Metoclopramide hcl; and Sulfonamide derivatives  Objective: Vital signs in last 24 hours: Temp:  [95.8 F (35.4 C)-98.5 F (36.9 C)] 98.3 F (36.8 C) (06/09 0710) Pulse Rate:  [82-89] 82  (06/09 0710) Resp:  [20] 20  (06/09 0710) BP: (113-150)/(71-83) 124/72 mmHg (06/09 0710) SpO2:  [93 %-98 %] 98 % (06/09 0755) Weight:  [81.285 kg (179 lb 3.2 oz)] 81.285 kg (179 lb 3.2 oz) (06/09 0559)  81.285 kg (179 lb 3.2 oz) Body mass index is 31.74 kg/(m^2).  Weight change:  Last BM Date:  (colostomy)  Intake/Output from previous day: 06/08 0701 - 06/09 0700 In: 1170 [P.O.:90; I.V.:240; TPN:840] Out: 2950 [Urine:2950]  Total I&O:  Essentially 0 after a 3 L diuresis over the past 3 days; Weight: Uncertain; values of 53 and 78 kg recorded 5/31 with values in the 50s prior to that and subsequent values in the 80s; weight is down 2 kg from peak.   General- Well developed; no acute distress Neck- No JVD, no carotid bruits Lungs- inspiratory and expiratory rhonchi; mildly prolonged expiratory phase Cardiovascular- normal PMI; normal S1 and S2; regular rhythm; modest systolic ejection murmur Abdomen- decreased bowel sounds; soft and non-tender without masses or organomegaly Skin- Warm, no significant lesions; multiple ecchymoses Extremities- Nl distal pulses; trace edema  CBC:   Basename 11/26/11 0416 11/25/11 0421  WBC 15.0* 15.8*  HGB 8.5* 8.5*  HCT 27.0* 26.6*  PLT 255 246   BMET:  Basename 11/27/11 0400 11/26/11 0416  NA 132* 131*  K 4.0 3.4*  CL 94* 93*  CO2 30 30  GLUCOSE 135* 139*  BUN 31* 28*  CREATININE 0.90 0.82  CALCIUM 8.9 8.7    Imaging Studies/Results: No results found.  Imaging: Imaging results have been reviewed.  6/5-Heart size and mediastinal contours are stable. There is stable patchy airspace disease at the left lung base and a stable calcified left upper lobe granuloma.  Medications:  I have reviewed the patient's current medications. Scheduled: Infusions:     . dextrose 5 % and 0.45 % NaCl with KCl 20 mEq/L 20 mL/hr at 11/25/11 2002  . fat emulsion 240 mL (11/25/11 1732)  . TPN (CLINIMIX) +/- additives 70 mL/hr at 11/25/11 1731  . TPN (CLINIMIX) +/- additives 70 mL/hr at 11/26/11 1802  . TPN (CLINIMIX) +/- additives      Assessment/Plan:  Congestive heart failure: Patient has improved with diuresis.  No convincing evidence for CHF either radiographically or by exam, but continued gentle diuresis as tolerated by renal function is appropriate.  Most recent echocardiogram available is from 2011 at which time EF was 35% with a large apical wall motion abnormality and posterior wall dyskinesis.  No significant valvular abnormalities; no significant elevation of right-sided pressures.  Atrial fibrillation:  Telemetry shows sinus rhythm with PAC s.  Continue to defer anticoagulation or antiarrhythmic Rx.  COPD:  Improved; current regimen appears adequate.   LOS: 16 days   Oneida Castle Bing 11/27/2011, 10:27 AM

## 2011-11-28 LAB — DIFFERENTIAL
Lymphocytes Relative: 5 % — ABNORMAL LOW (ref 12–46)
Lymphs Abs: 0.7 10*3/uL (ref 0.7–4.0)
Monocytes Relative: 6 % (ref 3–12)
Neutrophils Relative %: 89 % — ABNORMAL HIGH (ref 43–77)

## 2011-11-28 LAB — COMPREHENSIVE METABOLIC PANEL
ALT: 30 U/L (ref 0–35)
Albumin: 2 g/dL — ABNORMAL LOW (ref 3.5–5.2)
BUN: 35 mg/dL — ABNORMAL HIGH (ref 6–23)
Calcium: 9.2 mg/dL (ref 8.4–10.5)
GFR calc Af Amer: 68 mL/min — ABNORMAL LOW (ref >90)
Glucose, Bld: 131 mg/dL — ABNORMAL HIGH (ref 70–99)
Potassium: 3.5 mEq/L (ref 3.5–5.1)
Sodium: 132 mEq/L — ABNORMAL LOW (ref 135–145)
Total Protein: 5.4 g/dL — ABNORMAL LOW (ref 6.0–8.3)

## 2011-11-28 LAB — TRIGLYCERIDES: Triglycerides: 122 mg/dL (ref <150)

## 2011-11-28 LAB — MAGNESIUM: Magnesium: 1.8 mg/dL (ref 1.5–2.5)

## 2011-11-28 LAB — CBC
Hemoglobin: 8 g/dL — ABNORMAL LOW (ref 12.0–15.0)
Platelets: 263 10*3/uL (ref 150–400)
RBC: 2.85 MIL/uL — ABNORMAL LOW (ref 3.87–5.11)
WBC: 13.9 10*3/uL — ABNORMAL HIGH (ref 4.0–10.5)

## 2011-11-28 LAB — GLUCOSE, CAPILLARY
Glucose-Capillary: 78 mg/dL (ref 70–99)
Glucose-Capillary: 117 mg/dL — ABNORMAL HIGH (ref 70–99)
Glucose-Capillary: 127 mg/dL — ABNORMAL HIGH (ref 70–99)
Glucose-Capillary: 113 mg/dL — ABNORMAL HIGH (ref 70–99)

## 2011-11-28 LAB — PREALBUMIN: Prealbumin: 20.6 mg/dL (ref 17.0–34.0)

## 2011-11-28 LAB — CHOLESTEROL, TOTAL: Cholesterol: 132 mg/dL (ref 0–200)

## 2011-11-28 MED ORDER — FAMOTIDINE 20 MG PO TABS
20.0000 mg | ORAL_TABLET | Freq: Every day | ORAL | Status: DC
  Administered 2011-11-28 – 2011-12-01 (×4): 20 mg via ORAL
  Filled 2011-11-28 (×5): qty 1

## 2011-11-28 MED ORDER — FUROSEMIDE 40 MG PO TABS
40.0000 mg | ORAL_TABLET | Freq: Two times a day (BID) | ORAL | Status: DC
  Administered 2011-11-28 – 2011-11-30 (×4): 40 mg via ORAL
  Filled 2011-11-28 (×6): qty 1

## 2011-11-28 MED ORDER — ENSURE COMPLETE PO LIQD
237.0000 mL | Freq: Two times a day (BID) | ORAL | Status: DC
  Administered 2011-11-29 – 2011-12-02 (×7): 237 mL via ORAL

## 2011-11-28 MED ORDER — METOPROLOL TARTRATE 25 MG PO TABS
25.0000 mg | ORAL_TABLET | Freq: Two times a day (BID) | ORAL | Status: DC
  Administered 2011-11-28 – 2011-12-02 (×9): 25 mg via ORAL
  Filled 2011-11-28 (×10): qty 1

## 2011-11-28 MED ORDER — PREDNISONE 5 MG PO TABS
7.5000 mg | ORAL_TABLET | Freq: Every day | ORAL | Status: DC
  Administered 2011-11-29 – 2011-12-02 (×4): 7.5 mg via ORAL
  Filled 2011-11-28 (×5): qty 1

## 2011-11-28 MED ORDER — ALBUTEROL SULFATE (5 MG/ML) 0.5% IN NEBU
2.5000 mg | INHALATION_SOLUTION | RESPIRATORY_TRACT | Status: DC
  Administered 2011-11-28 – 2011-11-30 (×9): 2.5 mg via RESPIRATORY_TRACT
  Filled 2011-11-28 (×10): qty 0.5

## 2011-11-28 MED ORDER — FAT EMULSION 20 % IV EMUL
240.0000 mL | INTRAVENOUS | Status: AC
  Administered 2011-11-28: 240 mL via INTRAVENOUS
  Filled 2011-11-28: qty 250

## 2011-11-28 MED ORDER — ALBUTEROL SULFATE (5 MG/ML) 0.5% IN NEBU
2.5000 mg | INHALATION_SOLUTION | RESPIRATORY_TRACT | Status: DC | PRN
  Filled 2011-11-28: qty 0.5

## 2011-11-28 MED ORDER — ZINC TRACE METAL 1 MG/ML IV SOLN
INTRAVENOUS | Status: AC
  Administered 2011-11-28: 18:00:00 via INTRAVENOUS
  Filled 2011-11-28: qty 2000

## 2011-11-28 MED ORDER — IPRATROPIUM BROMIDE 0.02 % IN SOLN
0.5000 mg | RESPIRATORY_TRACT | Status: DC | PRN
  Filled 2011-11-28: qty 2.5

## 2011-11-28 MED ORDER — IPRATROPIUM BROMIDE 0.02 % IN SOLN
0.5000 mg | RESPIRATORY_TRACT | Status: DC
  Administered 2011-11-28 – 2011-11-30 (×9): 0.5 mg via RESPIRATORY_TRACT
  Filled 2011-11-28 (×11): qty 2.5

## 2011-11-28 MED ORDER — LEVOTHYROXINE SODIUM 50 MCG PO TABS
50.0000 ug | ORAL_TABLET | Freq: Every day | ORAL | Status: DC
  Administered 2011-11-29 – 2011-12-02 (×4): 50 ug via ORAL
  Filled 2011-11-28 (×5): qty 1

## 2011-11-28 NOTE — Consult Note (Signed)
WOC consult Note Reason for Consult: NPWT dressing change; ostomy pouch change Wound type:Surgical Wound bed: Wound observed with Zola Button, PA.  First indication of cell migration noted at 5 o'clock. Drainage (amount, consistency, odor) scant amount of serous drainage in tubing.   Periwound: Intact Dressing procedure/placement/frequency: V.A.C. Dressing change using 1 pc of white foam over biologic mesh, three pieces of black foam (2 beneath retention sutures, one over white foam).  Seal achieved immediately and without discomfort expressed by patient.  Husband present.  continuous pressure.  LLQ Colostomy pouching system changed.  Supplies in room (1 pc white foam, 1 pc black medium foam, 1 colostomy pouching system). Next scheduled dressing change is on Wednesday, 11/30/11. WOC team will follow.  Please re-consult if needed prior to next scheduled change. Thanks, Ladona Mow, MSN, RN, [facility], CWOCN [phone])

## 2011-11-28 NOTE — Progress Notes (Signed)
Nutrition Follow-up  Anticipated d/c date 6/14 to SNF.  New colostomy.    Diet Order:  Full liquid- tolerating well but does not like many options.  Eating 100%.  TPN:  Clinimix E 5/15 at 70 ml/hr + 20% lipids at 10 ml/hr MWF providing:  1399 kcal/84 gr protein (daily average).  Meds: Scheduled Meds:   . ipratropium  0.5 mg Nebulization Q4H   And  . albuterol  2.5 mg Nebulization Q4H  . antiseptic oral rinse  15 mL Mouth Rinse QID  . chlorhexidine  15 mL Mouth Rinse BID  . DULoxetine  30 mg Oral BID  . famotidine  20 mg Oral QHS  . furosemide  40 mg Oral BID  . guaiFENesin  600 mg Oral BID  . heparin subcutaneous  5,000 Units Subcutaneous Q8H  . insulin aspart  0-9 Units Subcutaneous Q8H  . levothyroxine  50 mcg Oral QAC breakfast  . metoprolol tartrate  25 mg Oral BID  . predniSONE  7.5 mg Oral Q breakfast  . sodium chloride  10-40 mL Intracatheter Q12H  . DISCONTD: albuterol-ipratropium  2 puff Inhalation Q4H  . DISCONTD: furosemide  40 mg Intravenous BID  . DISCONTD: hydrocortisone sodium succinate  25 mg Intravenous Q12H  . DISCONTD: levothyroxine  26 mcg Intravenous QAC breakfast  . DISCONTD: metoprolol  5 mg Intravenous Q6H  . DISCONTD: pantoprazole (PROTONIX) IV  40 mg Intravenous Q24H   Continuous Infusions:   . dextrose 5 % and 0.45 % NaCl with KCl 20 mEq/L 20 mL/hr at 11/25/11 2002  . fat emulsion    . TPN (CLINIMIX) +/- additives 70 mL/hr at 11/26/11 1802  . TPN (CLINIMIX) +/- additives 70 mL/hr at 11/27/11 1753  . TPN (CLINIMIX) +/- additives     PRN Meds:.acetaminophen, albuterol, ALPRAZolam, diphenhydrAMINE, fentaNYL, ipratropium, sodium chloride  Labs:  CMP     Component Value Date/Time   NA 132* 11/28/2011 0326   K 3.5 11/28/2011 0326   CL 94* 11/28/2011 0326   CO2 31 11/28/2011 0326   GLUCOSE 131* 11/28/2011 0326   BUN 35* 11/28/2011 0326   CREATININE 0.93 11/28/2011 0326   CALCIUM 9.2 11/28/2011 0326   PROT 5.4* 11/28/2011 0326   ALBUMIN 2.0* 11/28/2011  0326   AST 26 11/28/2011 0326   ALT 30 11/28/2011 0326   ALKPHOS 92 11/28/2011 0326   BILITOT 0.1* 11/28/2011 0326   GFRNONAA 59* 11/28/2011 0326   GFRAA 68* 11/28/2011 0326     Intake/Output Summary (Last 24 hours) at 11/28/11 1642 Last data filed at 11/28/11 1454  Gross per 24 hour  Intake    730 ml  Output   4150 ml  Net  -3420 ml    Weight Status:  82.4 kg stable  Re-estimated needs:  1400-1500 kcal, 75-85 gm protein per day  Nutrition Dx:  Inadequate oral intake--progressing  Goal:  Diet advancement as able.  Wean tpn.  Intervention:   Diet advancement to solids per MD Will Add  Ensure bid Continue [employer].  Pt to have a choice of supplement.  Monitor:  Tpn, labs, weight, trend, plan of care, diet advancement   Jeoffrey Massed Pager #:  [phone]

## 2011-11-28 NOTE — Progress Notes (Signed)
CBG: 67  Treatment: 15 GM carbohydrate snack  Symptoms: None  Follow-up CBG: Time:1520  CBG Result:127  Possible Reasons for Event: Unknown  Comments/MD notified    Abby Stines, Yancey Flemings

## 2011-11-28 NOTE — Care Management Note (Signed)
    Page 1 of 2   12/02/2011     2:40:40 PM   CARE MANAGEMENT NOTE 12/02/2011  Patient:  [name],[name]   Account Number:  [id number]  Date Initiated:  11/14/2011  Documentation initiated by:  DAVIS,RHONDA  Subjective/Objective Assessment:   Patient with perforated diverticlum required open lap post op resp failure and a.fib requiring iv intervention     Action/Plan:   lives at home/new ostomy pouch. being seen by the [facility] RN, may need hhc   Anticipated DC Date:  12/02/2011   Anticipated DC Plan:  LONG TERM ACUTE CARE (LTAC)  In-house referral  Clinical Social Worker      DC Planning Services  CM consult      [facility] Choice  NA   Choice offered to / List presented to:  NA   DME arranged  NA      DME agency  NA     HH arranged  NA      HH agency  NA   Status of service:  Completed, signed off Medicare Important Message given?  NA - LOS <3 / Initial given by admissions (If response is "NO", the following Medicare IM given date fields will be blank) Date Medicare IM given:   Date Additional Medicare IM given:    Discharge Disposition:  LONG TERM ACUTE CARE (LTAC)  Per UR Regulation:  Reviewed for med. necessity/level of care/duration of stay  If discussed at Long Length of Stay Meetings, dates discussed:   11/22/2011  11/30/2011    Comments:  12/02/11 Miranda Robak RN,BSN NCM [phone] RECEIVED CALL FROM [facility]-[name] W/RECEIVING MD-DR. FORTKORT,RM [id number],NURSE UPDATED TO CALL FOR REPORT TEL#[phone],CALL [facility] FOR TRANSPORTATION.D/C SUMMARY FAXED W/CONFIRMATION.MD UPDATED. PER [facility]-[name], NO BEDS AVAILABLE,WILL LIKELY HAVE A BED ON MONDAY.MD/NSG STAFF/PATIENT UPDATED.D/C SUMMARY-UPDATED,ALL PAPERWORK ON CHART IF BED BECOMES AVAILABLE OVER WEEKEND.AWAITING MD,BED,& RM,WILL TRANSPORT VIA [facility].  12/01/11 Miranda Stevens RN,BSN NCM [phone] UPDATE 2:40P [facility][name]([facility])-NO BED AVAILABLE TODAY.PATIENT/SPOUSE/MD/NURSE UPDATED.ALL FORMS ON  CHART-D/C SUMMARY,COBRA FORM.STILL AWAITING RECEIVING MD,BED,& RM. COBRA FORM SIGNED,AWAITING RECEIVING MD,RM,BED FROM [name].WILL FAX D/C SUMMARY,NURSE TO CALL REPORT,& THEN [facility] ONCE NOTIFIED OF BED.PATIENT/SPOUSE IN RM & UPDATED.  11/30/11 Miranda Welp RN,BSN NCM [phone] LTAC APPROPRIATE.PATIENT AGREED TO LTAC(FORM ON CHART).COBRA FORM SIGNED BY PATIENT,AWAIT MD SIGNATURE.WILL AWAIT RECEIVING PHYSICIAN,BED,& RM(LIKELY IN AM).SPOUSE [name] WILL HAVE AN APPT @ [facility],BUT WOULD LIKE TO BE HERE FOR TRANSFER,CALL HIM ONCE BED,& RM GIVEN.C#[phone].MD UPDATED.  11/28/11 Miranda Bakken RN,BSN NCM [phone] [facility] FOLLOIWNG,WOC FOLLOWING.PCCM FOLLOWING.OSTOMY-VAC.PT/OT-SNF.  [id number] Miranda Plater, RN, BSN, CCM No discharge needs present at time of this review at the sdu/icu level. patient has new colostmy may need snf at [facility] for the intial home phase.  Patient does also use [facility] for her husband who recently has had and IVAD placement done.  Children and family are also very involoved in parents care. Case Management [phone]   [id number] Miranda Smiling, RN,BSN,CCM No discharge needs present at time of this review Case Management [phone]

## 2011-11-28 NOTE — Progress Notes (Signed)
General Surgery [facility] - Attending  Patient seen and examined.  Dressings changed today by [facility] nurse.  Taking some FL diet.  Will plan to discontinue TNA soon.  Velora Heckler, MD, [facility] Office: [phone]

## 2011-11-28 NOTE — Progress Notes (Addendum)
Name: [name] MRN: [id number] DOB: 1933/07/31    LOS: 17  Referring Provider:  EDP Reason for Referral:  shock  PULMONARY / CRITICAL CARE MEDICINE  BRIEF PROFILE:  12 yowf with steroid dep obstructive lung disease, CHF, CAF, divverticulitis adm by PCCM via [facility] am 5/24 with increased abd pain and hypotension. CT abd c/w free air and divertular abscess. Ex-lap 5/24 PM revealed perforated sigmoid diverticulitis. Underwent sigmoid colon resection and abscess drainage (Dr Biagio Quint). Remained intubated post op  SUBJECTIVE:  6/1- wound dehiscence, back to OR, post op resp failure 6/4 wants to remain in bed for feeling bad. 6/5 wants to get up 6/5 rt picc line placed. 6/6 more awake. 6/7 adamant about not going to floor.  Subjective/ overnight:  Still has cough, but difficult to expectorate.  Vital Signs: Temp:  [97.4 F (36.3 C)-99.2 F (37.3 C)] 97.4 F (36.3 C) (06/10 0413) Pulse Rate:  [76-86] 76  (06/10 0413) Resp:  [18-20] 18  (06/10 0413) BP: (119-147)/(70-84) 147/73 mmHg (06/10 0413) SpO2:  [96 %-97 %] 96 % (06/10 1154) Weight:  [181 lb 9.5 oz (82.37 kg)] 181 lb 9.5 oz (82.37 kg) (06/10 0454) 4 liters  Physical Examination: HEENT: WNL Chest: coarse. +congested cough Heart: s1s2 no murmur Abd: No BS, wound vac, with open abd wound, colostomy with scant drainage, more +gas Ext: warm, no edema. Surgical absence right ring finger. Neuro: no focal deficits, follows commands  No results found.  Lab Results  Component Value Date   WBC 13.9* 11/28/2011   HGB 8.0* 11/28/2011   HCT 25.8* 11/28/2011   MCV 90.5 11/28/2011   PLT 263 11/28/2011   Lab Results  Component Value Date   CREATININE 0.93 11/28/2011   BUN 35* 11/28/2011   NA 132* 11/28/2011   K 3.5 11/28/2011   CL 94* 11/28/2011   CO2 31 11/28/2011      ASSESSMENT AND PLAN  PULMONARY  Acute respiratory Failure, post op 6/1>>resolved  COPD/fixed asthma, (Xolair, Singulair, Pred are chronic meds). plan Oxygen  as needed to keep SpO2 > 92% >>Raynaud's phenomena makes pulse ox readings difficult Pulmonary hygiene Transition back to outpt dose of prednisone (7.5 mg daily) Change BD's to nebulizer therapy Add montelukast F/u CXR 6/11   CARDIOVASCULAR Cardiology consult 5/24   Afib w/ RVR, chronic systolic CHF (EF 40-98%) plan Cardiology following>>defer anticoagulation, antiarrhythmic tx for now  RENAL  AKI / ATN, resolved.   Hypokalemia Plan F/u and replace as needed  GASTROINTESTINAL  Diet : full liquids 6/6 + TNA  Perforated diverticulitis s/p sigmoid colectomy complicated by wound dehisence, and post op ileus. Plan Advance diet per [facility]>>continue TNA until able to have adequate oral intake Wound vac per [facility]  HEMATOLOGIC  Anemia of critical illness, and chronic disease Plan F/u CBC Transfuse for Hb < 7  INFECTIOUS  Cultures: UC 5/24 >>15K mult orgs.  BC x2 5/24 >>neg  Antibiotics: Vancomycin  5/24 only Zosyn 5/24 >>6/3  Diverticular abscess due to perforated divertic/ low grade sepsis  Plan Completed course of Abx>>monitor clinically  ENDOCRINE  Hypothyroidism plan Change synthroid to po  NEUROLOGIC  Pain control Plan Prn tylenol, fentanyl  DISPOSITION  Continue hospital stay until abdominal process more stable>>defer timing for LTAC evaluation to [facility].  Followed by Dr. Felipa Eth as outpt for primary care.  Per Dr. Vicente Males office stay, Dr. Vicente Males pts are now followed by [facility].  Will ask [facility] to assume primary care duties from 6/11.  PCCM will continue to  follow as pulmonary consultant.  Brett Canales Minor ACNP [facility] PCCM Pager [phone] till 3 pm If no answer page [phone] 11/28/2011, 12:10 PM  Coralyn Helling, MD [facility] Pulmonary/Critical Care 11/28/2011, 1:19 PM Pager:  [phone] After 3pm call: [phone]

## 2011-11-28 NOTE — Progress Notes (Signed)
Occupational Therapy Treatment Patient Details Name: [name] Syfert MRN: [id number] DOB: 1934-01-24 Today's Date: 11/28/2011 Time: 4098-1191 OT Time Calculation (min): 18 min  OT Assessment / Plan / Recommendation Comments on Treatment Session      Follow Up Recommendations  Skilled nursing facility    Barriers to Discharge       Equipment Recommendations  Defer to next venue    Recommendations for Other Services    Frequency Min 2X/week   Plan Discharge plan remains appropriate    Precautions / Restrictions Precautions Precautions: Fall Precaution Comments: pt with VAC on abdominal wound Restrictions Weight Bearing Restrictions: No   Pertinent Vitals/Pain     ADL  Transfers/Ambulation Related to ADLs: overlapped with PT.  Worked on sit to stand ADL Comments: Transferred to chair.  Educated on Sports administrator.  Pt does not want to do own ADLs until catheter is removed; she hopes it remains for as long as possible as she frequently has to urinate.      OT Diagnosis:    OT Problem List:   OT Treatment Interventions:     OT Goals Acute Rehab OT Goals Time For Goal Achievement: 12/08/11 Potential to Achieve Goals: Good Miscellaneous OT Goals Miscellaneous OT Goal #2: Pt will stand with RW x 30 seconds for ADLs with min guard A OT Goal: Miscellaneous Goal #2 - Progress: Other (comment) Miscellaneous OT Goal #3: Pt will demonstrate use of AE from seated level with supervision (modified ) OT Goal: Miscellaneous Goal #3 - Progress: Progressing toward goals  Visit Information  Last OT Received On: 11/28/11 Assistance Needed: +2 (safety) PT/OT Co-Evaluation/Treatment: Yes (overlapped session)    Subjective Data  Subjective: "Not today--theraband exercise"   Prior Functioning       Cognition  Overall Cognitive Status: Appears within functional limits for tasks assessed/performed Arousal/Alertness: Awake/alert Orientation Level: Appears intact for tasks assessed;Oriented X4 /  Intact Behavior During Session: [facility] for tasks performed    Mobility Bed Mobility Left Sidelying to Sit: 4: Min assist;With rails;HOB elevated;Other (comment) (air mattress deflated) Details for Bed Mobility Assistance: cues for sequence, technique; 2nd person to assist in managing lines Transfers Sit to Stand: 1: +2 Total assist;From bed;From elevated surface;With upper extremity assist;From chair/3-in-1 Sit to Stand: Patient Percentage: 90% Stand to Sit: 1: +2 Total assist;To chair/3-in-1 Stand to Sit: Patient Percentage: 90% Details for Transfer Assistance: pt needs encouragement, but is able to follow cues to weight shift and move to chair. Reapeated sit to stand 3 more times for strengthening.   Exercises    Balance Balance Balance Assessed: No  End of Session OT - End of Session Activity Tolerance: Patient limited by fatigue;Patient limited by pain Patient left: in chair;with call bell/phone within reach   [facility] 11/28/2011, 2:28 PM

## 2011-11-28 NOTE — Progress Notes (Signed)
Physical Therapy Treatment Patient Details Name: Rosemary Mossbarger Hicks MRN: [id number] DOB: 12-Dec-1933 Today's Date: 11/28/2011 Time: 1345-1406 PT Time Calculation (min): 21 min  PT Assessment / Plan / Recommendation Comments on Treatment Session  pt continues to progress in mobility and general strength    Follow Up Recommendations  Skilled nursing facility;LTACH    Barriers to Discharge        Equipment Recommendations  Defer to next venue    Recommendations for Other Services    Frequency Min 3X/week   Plan Discharge plan remains appropriate;Frequency remains appropriate    Precautions / Restrictions Precautions Precautions: Fall Precaution Comments: pt with VAC on abdominal wound Restrictions Weight Bearing Restrictions: No   Pertinent Vitals/Pain C/o small amount of abdominal pain when up in chair    Mobility  Bed Mobility Left Sidelying to Sit: 4: Min assist;With rails;HOB elevated;Other (comment) (air mattress deflated) Details for Bed Mobility Assistance: cues for sequence, technique; 2nd person to assist in managing lines Transfers Transfers: Sit to Stand;Stand to Sit Sit to Stand: 1: +2 Total assist;From bed;From elevated surface;With upper extremity assist;From chair/3-in-1 Sit to Stand: Patient Percentage: 90% Stand to Sit: 1: +2 Total assist;To chair/3-in-1 Stand to Sit: Patient Percentage: 90% Stand Pivot Transfers: 1: +2 Total assist Stand Pivot Transfers: Patient Percentage: 90% Details for Transfer Assistance: pt needs encouragement, but is able to follow cues to weight shift and move to chair. Reapeated sit to stand 3 more times for strengthening. Ambulation/Gait Ambulation/Gait Assistance: Not tested (comment) Assistive device: Rolling walker Ambulation/Gait Assistance Details: pt used RW to step pivot to chair Gait Pattern: Step-to pattern General Gait Details: stand pivot with Rw bed to chair Stairs: No Wheelchair Mobility Wheelchair Mobility: No      Exercises     PT Diagnosis:    PT Problem List:   PT Treatment Interventions:     PT Goals Acute Rehab PT Goals Time For Goal Achievement: 12/02/11 Potential to Achieve Goals: Fair PT Goal: Supine/Side to Sit - Progress: Progressing toward goal Pt will go Sit to Stand: with supervision PT Goal: Sit to Stand - Progress: Progressing toward goal Pt will go Stand to Sit: with supervision PT Goal: Stand to Sit - Progress: Progressing toward goal PT Goal: Ambulate - Progress: Progressing toward goal  Visit Information  Last PT Received On: 11/28/11 Assistance Needed: +2    Subjective Data  Subjective: My spine is like a noodle Patient Stated Goal: to get to chair   Cognition  Overall Cognitive Status: Appears within functional limits for tasks assessed/performed Arousal/Alertness: Awake/alert Orientation Level: Appears intact for tasks assessed;Oriented X4 / Intact Behavior During Session: [facility] for tasks performed    Balance  Balance Balance Assessed: No  End of Session PT - End of Session Activity Tolerance: Patient tolerated treatment well Patient left: in chair;with call bell/phone within reach Nurse Communication: Mobility status   Bayard Hugger. [place], [place] [phone] 11/28/2011, 2:19 PM

## 2011-11-28 NOTE — Progress Notes (Signed)
PARENTERAL NUTRITION CONSULT NOTE  Pharmacy Consult for TNA Indication: Sigmoid Rupture/Colostomy  Allergies  Allergen Reactions  . Aspirin   . Ibuprofen   . Iron Dextran     REACTION: itching after test dose  . Metoclopramide Hcl     REACTION: chomping mouth  . Sulfonamide Derivatives    Patient Measurements: Height: 5\' 3"  (160 cm) Weight: 181 lb 9.5 oz (82.37 kg) IBW/kg (Calculated) : 52.4  Adjusted Body Weight: 62kg Usual Weight: 74kg  Vital Signs: Temp: 97.4 F (36.3 C) (06/10 0413) Temp src: Oral (06/10 0413) BP: 147/73 mmHg (06/10 0413) Pulse Rate: 76  (06/10 0413) Intake/Output from previous day: 06/09 0701 - 06/10 0700 In: 970 [P.O.:960; I.V.:10] Out: 4450 [Urine:3650; Stool:800]  Labs:  [facility] 11/28/11 0326 11/26/11 0416  WBC 13.9* 15.0*  HGB 8.0* 8.5*  HCT 25.8* 27.0*  PLT 263 255  APTT -- --  INR -- --     Basename 11/28/11 0326 11/27/11 0400 11/26/11 0416  NA 132* 132* 131*  K 3.5 4.0 3.4*  CL 94* 94* 93*  CO2 31 30 30   GLUCOSE 131* 135* 139*  BUN 35* 31* 28*  CREATININE 0.93 0.90 0.82  LABCREA -- -- --  CREAT24HRUR -- -- --  CALCIUM 9.2 8.9 8.7  MG 1.8 -- 1.9  PHOS 3.0 -- 2.3  PROT 5.4* -- --  ALBUMIN 2.0* -- --  AST 26 -- --  ALT 30 -- --  ALKPHOS 92 -- --  BILITOT 0.1* -- --  BILIDIR -- -- --  IBILI -- -- --  PREALBUMIN -- -- --  TRIG 122 -- --  CHOLHDL -- -- --  CHOL 132 -- --   Estimated Creatinine Clearance: 53.1 ml/min (by C-G formula based on Cr of 0.93).    Basename 11/28/11 0621 11/27/11 2158 11/27/11 1425  GLUCAP 117* 78 151*    Medications:  Scheduled:     . albuterol-ipratropium  2 puff Inhalation Q4H  . antiseptic oral rinse  15 mL Mouth Rinse QID  . chlorhexidine  15 mL Mouth Rinse BID  . DULoxetine  30 mg Oral BID  . furosemide  40 mg Intravenous BID  . guaiFENesin  600 mg Oral BID  . heparin subcutaneous  5,000 Units Subcutaneous Q8H  . hydrocortisone sodium succinate  25 mg Intravenous Q12H  .  insulin aspart  0-9 Units Subcutaneous Q8H  . levothyroxine  26 mcg Intravenous QAC breakfast  . metoprolol  5 mg Intravenous Q6H  . pantoprazole (PROTONIX) IV  40 mg Intravenous Q24H  . sodium chloride  10-40 mL Intracatheter Q12H    Insulin Requirements in the past 24 hours:  CBG's 78-151 Sensitive SSI - 3 unit   Current Nutrition:  FLD (started 6/8) --> 960 mL intake recorded for yesterday Clinimix 5/15 at 70 ml/hr + Lipids MWF IVF: D5 0.45NaCl 20KCl at [facility]  Nutritional Goals:  Per RD 6/2 note: 1400-1500 Kcal and 75-85 grams protein/day.  TPN to meet > 90% estimated needs.    Clinimix 5/15 at goal rate 70 ml/hr will provide on average 1398 Kcal and 84 grams protein/day (1672 kcal/day with lipids, 1193 kcal/day without)  Assessment:  78 yo Female with perforated diverticular abscess; resection, abscess drainage 5/25. Underwent wound closure/mesh 6/2.   At risk for re-feeding syndrome with prolonged NPO.  Has been tolerating TNA at goal rate.  PO intake improving - f/u plans for tapering off TNA.  Avoid volume overload with HF.  No history of DM, on Hydrocortisone  IV, and chronic steroid use PTA for COPD.  GI PPx: Protonix IV Electrolytes/Labs:  Na low - unable to adjust Na-content in Clinimix product.  K, Mg, and Phos WNL  AST/ALT WNL  Cholesterol and TG WNL  Pre-Albumin 10.4 on 6/3, Albumin low  CBG's WNL  Plan: At 1800 today  Continue Clinimix E 5/15 at goal rate 70 ml/hr  Fat emulsion at 10 ml/hr (MWF only due to ongoing shortage).  TNA to contain standard multivitamins and trace elements (MWF only due to ongoing shortage).  Continue CBGs and sensitive slide scale insulin at q8h  Follow up diet and PO intake and plan for tapering off TNA.  TNA lab panels on Mondays & Thursdays.  F/u daily.  Clance Boll, PharmD, BCPS Pager: [phone] 11/28/2011 10:07 AM

## 2011-11-28 NOTE — Progress Notes (Signed)
9 Days Post-Op  Subjective: Up to full liquids, colostomy working well.  Dressing changed and she has some granulation going over the Mesh that is was not present on Saturday.  She had ongoing discomfort with the retention sutures.    Objective: Vital signs in last 24 hours: Temp:  [97.4 F (36.3 C)-99.2 F (37.3 C)] 97.4 F (36.3 C) (06/10 0413) Pulse Rate:  [76-86] 76  (06/10 0413) Resp:  [18-20] 18  (06/10 0413) BP: (119-147)/(70-84) 147/73 mmHg (06/10 0413) SpO2:  [96 %-97 %] 96 % (06/10 1154) Weight:  [82.37 kg (181 lb 9.5 oz)] 82.37 kg (181 lb 9.5 oz) (06/10 0454) Last BM Date:  (colostomy)  800 ml thru colostomy, VSS, CMP, is Ok , WBC is 13.9 better than last 11/26/11, on steroids for pulmonary asthma issues,TNA, Diet is full liquids, Intake/Output from previous day: 06/09 0701 - 06/10 0700 In: 970 [P.O.:960; I.V.:10] Out: 4450 [Urine:3650; Stool:800] Intake/Output this shift:    General appearance: alert, cooperative and no distress GI: abd soft, skin is tender, some erthema around retention sutrure sites.  the middle retention suture is very loose.The wound is really very clean.    Lab Results:   Basename 11/28/11 0326 11/26/11 0416  WBC 13.9* 15.0*  HGB 8.0* 8.5*  HCT 25.8* 27.0*  PLT 263 255    BMET  Basename 11/28/11 0326 11/27/11 0400  NA 132* 132*  K 3.5 4.0  CL 94* 94*  CO2 31 30  GLUCOSE 131* 135*  BUN 35* 31*  CREATININE 0.93 0.90  CALCIUM 9.2 8.9   PT/INR No results found for this basename: LABPROT:2,INR:2 in the last 72 hours   Lab 11/28/11 0326 11/24/11 0400  AST 26 14  ALT 30 11  ALKPHOS 92 55  BILITOT 0.1* 0.2*  PROT 5.4* 5.2*  ALBUMIN 2.0* 1.9*     Lipase  No results found for this basename: lipase     Studies/Results: No results found.  Medications:    . albuterol-ipratropium  2 puff Inhalation Q4H  . antiseptic oral rinse  15 mL Mouth Rinse QID  . chlorhexidine  15 mL Mouth Rinse BID  . DULoxetine  30 mg Oral BID  .  furosemide  40 mg Intravenous BID  . guaiFENesin  600 mg Oral BID  . heparin subcutaneous  5,000 Units Subcutaneous Q8H  . hydrocortisone sodium succinate  25 mg Intravenous Q12H  . insulin aspart  0-9 Units Subcutaneous Q8H  . levothyroxine  26 mcg Intravenous QAC breakfast  . metoprolol  5 mg Intravenous Q6H  . pantoprazole (PROTONIX) IV  40 mg Intravenous Q24H  . sodium chloride  10-40 mL Intracatheter Q12H    Assessment/Plan EXPLORATORY LAPAROTOMY, COLON RESECTION SIGMOID INCISION AND DRAINAGE ABSCESS for perforated diverticulitis - 11/12/2011 - Dr. Trude Mcburney  Wound dehiscence - Closed with Strattice mesh and VAC - D. Newman - 11/19/2011  Atrial fibrillation - seen by Dr. Burna Forts  On amiodarone, and chronic anticoagulation History of HEART FAILURE EF 30-35%  Chronic obstructive asthma, steroid dependant CCM/Medicine following  HYPOTHYROIDISM  On isolation for MRSA\  On TNA For malnutrition CCM, is interested in LTAC,    Plan:  She is actually doing very well.  She is getting up to the chair.  She is stable from a Pulmonary standpoint.  DR. Hoxworth did not want her to go to LTAC last week.  I will review with DR. Gerkin and see how he feels, how much activity we can allow, and if we  can advance diet. She is still on TNA.  LOS: 17 days    [name] 11/28/2011

## 2011-11-29 ENCOUNTER — Inpatient Hospital Stay (HOSPITAL_COMMUNITY): Payer: Medicare Other

## 2011-11-29 DIAGNOSIS — J438 Other emphysema: Secondary | ICD-10-CM

## 2011-11-29 DIAGNOSIS — J45909 Unspecified asthma, uncomplicated: Secondary | ICD-10-CM

## 2011-11-29 DIAGNOSIS — E876 Hypokalemia: Secondary | ICD-10-CM

## 2011-11-29 DIAGNOSIS — K631 Perforation of intestine (nontraumatic): Secondary | ICD-10-CM

## 2011-11-29 LAB — URINALYSIS, ROUTINE W REFLEX MICROSCOPIC
Protein, ur: NEGATIVE mg/dL
Urobilinogen, UA: 0.2 mg/dL (ref 0.0–1.0)

## 2011-11-29 LAB — URINE MICROSCOPIC-ADD ON

## 2011-11-29 LAB — GLUCOSE, CAPILLARY
Glucose-Capillary: 101 mg/dL — ABNORMAL HIGH (ref 70–99)
Glucose-Capillary: 97 mg/dL (ref 70–99)

## 2011-11-29 LAB — BASIC METABOLIC PANEL
Chloride: 95 mEq/L — ABNORMAL LOW (ref 96–112)
GFR calc Af Amer: 65 mL/min — ABNORMAL LOW (ref >90)
Potassium: 3.1 mEq/L — ABNORMAL LOW (ref 3.5–5.1)

## 2011-11-29 LAB — CBC
Platelets: 294 10*3/uL (ref 150–400)
RDW: 14.9 % (ref 11.5–15.5)
WBC: 15.3 10*3/uL — ABNORMAL HIGH (ref 4.0–10.5)

## 2011-11-29 MED ORDER — ONDANSETRON HCL 4 MG/2ML IJ SOLN: 4.0000 mg | Freq: Four times a day (QID) | INTRAMUSCULAR | Status: DC | PRN

## 2011-11-29 MED ORDER — CLINIMIX E/DEXTROSE (5/15) 5 % IV SOLN
INTRAVENOUS | Status: AC
  Administered 2011-11-29: 17:00:00 via INTRAVENOUS
  Filled 2011-11-29: qty 2000

## 2011-11-29 MED ORDER — MONTELUKAST SODIUM 10 MG PO TABS
10.0000 mg | ORAL_TABLET | Freq: Every day | ORAL | Status: DC
  Administered 2011-11-29 – 2011-12-01 (×3): 10 mg via ORAL
  Filled 2011-11-29 (×4): qty 1

## 2011-11-29 MED ORDER — ONDANSETRON HCL 4 MG PO TABS
4.0000 mg | ORAL_TABLET | Freq: Three times a day (TID) | ORAL | Status: DC | PRN
  Administered 2011-11-29: 4 mg via ORAL
  Filled 2011-11-29: qty 1

## 2011-11-29 MED ORDER — POTASSIUM CHLORIDE CRYS ER 20 MEQ PO TBCR
40.0000 meq | EXTENDED_RELEASE_TABLET | ORAL | Status: AC
  Administered 2011-11-29 (×2): 40 meq via ORAL
  Filled 2011-11-29 (×2): qty 2

## 2011-11-29 NOTE — Progress Notes (Signed)
I have directly reviewed the clinical findings, lab, imaging studies and management of this patient in detail. I have interviewed and examined the patient and agree with the documentation,  as recorded by the Physician extender.   Patient was admitted about 2 weeks ago due to perforated viscus do to diverticulitis, diverticular abscess, she was operated by general surgery and was admitted under pulmonary critical care service, patient has significant past medical history of asthma and is steroid dependent on 7.5 mg of prednisone daily for the last 10-12 years, patient's during the initial surgery patient received colostomy, postop course was complicated by dehiscence of abdominal wound, this required repeat surgery with wound VAC placement, PEG tube placement, she has been on TPN for the last several days, currently taking clear liquids per surgery.  She does have coarse breath sounds and a very congested cough, her chest x-ray today is stable, we'll request pulmonary to provide her with flutter valve and do every 8 hour manual percussion and pulmonary suction and toiletry.   This morning patient complains of slight abdominal distention and some nausea after her oral diet which is liquid, she does not complain of abdominal pain, she has received some Zofran with benefit, we'll check KUB to rule out any obstruction or ileus, surgery following the patient will see the patient later today.  Morning patient has hypokalemia which has been replaced, she has persistent leukocytosis, she is on chronic steroids, currently afebrile, chest x-ray stable, she does have indwelling Foley catheter for the last several days since her admission which will be removed today, will repeat UA culture and sensitivity. Will check was were bladder scans to document bladder emptying.   Case was discussed with general surgeon Dr. Georgana Curio & pulmonary critical care physician Dr. Craige Cotta.   Leroy Sea M.D on 11/29/2011 at 9:48  AM  [facility] Office  [phone]

## 2011-11-29 NOTE — Progress Notes (Signed)
Attempted to implement order to discontinue pts. Foley catheter. Pt. Became very anxious and refused to have foley discontinued. Pt. Stated that it hurts for her to move and is unable to ambulate to the restroom. MD notified/made aware. Orders received to allow pt. To keep foley catheter in place. Shaeley Segall, Cheryll Dessert

## 2011-11-29 NOTE — Progress Notes (Signed)
Name: [name] MRN: [id number] DOB: 11-03-1933    LOS: 18  Referring Provider:  EDP Reason for Referral:  shock  PULMONARY / CRITICAL CARE MEDICINE  BRIEF PROFILE:  76 yowf with steroid dep obstructive lung disease, CHF, CAF, diverticulitis adm by PCCM via [facility] am 5/24 with increased abd pain and hypotension. CT abd c/w free air and divertular abscess. Ex-lap 5/24 PM revealed perforated sigmoid diverticulitis. Underwent sigmoid colon resection and abscess drainage (Dr Biagio Quint). Remained intubated post op  SUBJECTIVE:  6/1- wound dehiscence, back to OR, post op resp failure 6/4 wants to remain in bed for feeling bad. 6/5 wants to get up 6/5 rt picc line placed. 6/6 more awake. 6/7 adamant about not going to floor. 6/11 on floor NAD  Subjective/ overnight:  Cough and sputum production improved.  Vital Signs: Temp:  [97.7 F (36.5 C)-98.6 F (37 C)] 97.7 F (36.5 C) (06/11 0520) Pulse Rate:  [77-89] 77  (06/11 0520) Resp:  [18] 18  (06/11 0520) BP: (113-128)/(67-70) 113/67 mmHg (06/11 0520) SpO2:  [95 %-98 %] 98 % (06/11 0743) FiO2 (%):  [21 %] 21 % (06/10 1613) Weight:  [172 lb 6.4 oz (78.2 kg)] 172 lb 6.4 oz (78.2 kg) (06/11 0500) 4 liters  Physical Examination: HEENT: WNL Chest: coarse. +congested cough Heart: s1s2 no murmur Abd: No BS, wound vac, with open abd wound, colostomy with scant drainage, more +gas Ext: warm, no edema. Surgical absence right ring finger. Neuro: no focal deficits, follows commands  Dg Abd 1 View  11/29/2011  *RADIOLOGY REPORT*  Clinical Data: Abdominal pain and nausea.  Recent surgery for perforated diverticulitis.  ABDOMEN - 1 VIEW  Comparison: 11/11/2011  Findings: A percutaneous gastrostomy tube is seen overlying the stomach.  A small amount of contrast is seen in the rectosigmoid colon.  Both small bowel and colonic gas is seen, without evidence of significant dilatation.  Severe lumbar spondylosis and dextroscoliosis again noted.   IMPRESSION: Nonspecific, nonobstructive bowel gas pattern.  Original Report Authenticated By: Danae Orleans, M.D.   Dg Chest Port 1 View  11/29/2011  *RADIOLOGY REPORT*  Clinical Data: Shortness of breath, cough  PORTABLE CHEST - 1 VIEW  Comparison: 11/23/2011  Findings: Dual lead left pacer noted.  Left upper lobe pulmonary nodule is stable with density compatible with granuloma.  Left breast and axillary clips again noted.  Heart size is normal.  The lungs are grossly clear otherwise.  No pleural effusion.  Right- sided PICC line tip over the mid SVC.  Right IJ line has been removed.  No pneumothorax.  IMPRESSION: No new acute abnormality.  Original Report Authenticated By: Harrel Lemon, M.D.    Lab Results  Component Value Date   WBC 15.3* 11/29/2011   HGB 8.5* 11/29/2011   HCT 27.5* 11/29/2011   MCV 91.7 11/29/2011   PLT 294 11/29/2011   Lab Results  Component Value Date   CREATININE 0.97 11/29/2011   BUN 35* 11/29/2011   NA 134* 11/29/2011   K 3.1* 11/29/2011   CL 95* 11/29/2011   CO2 31 11/29/2011      ASSESSMENT AND PLAN  PULMONARY  Acute respiratory Failure, post op 6/1>>resolved  COPD/fixed asthma, (Xolair, Singulair, Pred are chronic meds).  Previously followed by Dr. Delton Coombes.  Followed by Dr. Lucie Leather as outpt for xolair. plan Oxygen as needed to keep SpO2 > 92% >>Raynaud's phenomena makes pulse ox readings difficult Pulmonary hygiene Transitioned back to outpt dose of prednisone (7.5 mg daily) Change  BD's to nebulizer therapy Add montelukast 6/11 F/u CXR intermittenly   CARDIOVASCULAR Cardiology consult 5/24   Afib w/ RVR, chronic systolic CHF (EF 78-29%) plan Cardiology following>>defer anticoagulation, antiarrhythmic tx for now  RENAL  AKI / ATN, resolved.   Hypokalemia Plan F/u and replace as needed  GASTROINTESTINAL  Diet : full liquids 6/6 + TNA  Perforated diverticulitis s/p sigmoid colectomy complicated by wound dehisence, and post op  ileus. Plan Advance diet per [facility]>>continue TNA until able to have adequate oral intake Wound vac per [facility]  HEMATOLOGIC  Anemia of critical illness, and chronic disease Plan F/u CBC Transfuse for Hb < 7  INFECTIOUS  Cultures: UC 5/24 >>15K mult orgs.  BC x2 5/24 >>neg  Antibiotics: Vancomycin  5/24 only Zosyn 5/24 >>6/3  Diverticular abscess due to perforated divertic/ low grade sepsis.  Low grade temperature spike 6/11. Plan Completed course of Abx>>monitor clinically  ENDOCRINE  Hypothyroidism plan Change synthroid to po  NEUROLOGIC  Pain control Plan Prn tylenol, fentanyl  DISPOSITION  Continue hospital stay until abdominal process more stable>>defer timing for LTAC evaluation to [facility].  Followed by Dr. Felipa Eth as outpt for primary care.  Per Dr. Vicente Males office stay, Dr. Vicente Males pts are now followed by [facility].  [facility] assumed primary care duties from 6/11.  PCCM will continue to follow as pulmonary consultant.  Brett Canales Minor ACNP [facility] P[facility]M Pager [phone] till 3 pm If no answer page [phone] 11/29/2011, 12:09 PM  Coralyn Helling, MD [facility] Pulmonary/Critical Care 11/29/2011, 2:59 PM Pager:  [phone] After 3pm call: [phone]

## 2011-11-29 NOTE — Progress Notes (Signed)
Subjective: Lying in bed awake, alert reports abdominal discomfort described as "feeling like i need to have BM and a little nauseated". Denied pain. NAD  Objective: Vital signs Filed Vitals:   11/28/11 2102 11/29/11 0500 11/29/11 0520 11/29/11 0743  BP: 115/67  113/67   Pulse: 89  77   Temp: 98.6 F (37 C)  97.7 F (36.5 C)   TempSrc: Oral  Oral   Resp: 18  18   Height:      Weight:  78.2 kg (172 lb 6.4 oz)    SpO2: 95%  96% 98%   Weight change: -4.17 kg (-9 lb 3.1 oz) Last BM Date:  (colostomy)  Intake/Output from previous day: 06/10 0701 - 06/11 0700 In: 2090 [P.O.:600; I.V.:320; TPN:1170] Out: 3675 [Urine:3650; Stool:25]     Physical Exam: General: Alert, awake, oriented x3, in no acute distress. HEENT: No bruits, no goiter. PERRL. Mucus membranes of mouth pink slightly dry, lips peeling/dry. Heart: Regular rate and rhythm, without murmurs, rubs, gallops. Lungs:Normal effort. Breath sounds coarse/rhonchus otherwise clear to auscultation bilaterally. Poor cough effort.  Abdomen: Soft, tender to slight palpation, wound vac with open wound intact, colostomy with large amount gas and moderate amount liquid stool. BS sluggish.  Extremities: No clubbing cyanosis or edema with positive pedal pulses. Neuro: Grossly intact, nonfocal. Speech clear. Facial symmetry.     Lab Results: Basic Metabolic Panel:  Basename 11/29/11 0330 11/28/11 0326  NA 134* 132*  K 3.1* 3.5  CL 95* 94*  CO2 31 31  GLUCOSE 102* 131*  BUN 35* 35*  CREATININE 0.97 0.93  CALCIUM 9.2 9.2  MG -- 1.8  PHOS -- 3.0   Liver Function Tests:  Basename 11/28/11 0326  AST 26  ALT 30  ALKPHOS 92  BILITOT 0.1*  PROT 5.4*  ALBUMIN 2.0*   No results found for this basename: LIPASE:2,AMYLASE:2 in the last 72 hours No results found for this basename: AMMONIA:2 in the last 72 hours CBC:  Basename 11/29/11 0330 11/28/11 0326  WBC 15.3* 13.9*  NEUTROABS -- 12.4*  HGB 8.5* 8.0*  HCT 27.5* 25.8*    MCV 91.7 90.5  PLT 294 263   Cardiac Enzymes: No results found for this basename: CKTOTAL:3,CKMB:3,CKMBINDEX:3,TROPONINI:3 in the last 72 hours BNP: No results found for this basename: PROBNP:3 in the last 72 hours D-Dimer: No results found for this basename: DDIMER:2 in the last 72 hours CBG:  Basename 11/29/11 0533 11/28/11 2058 11/28/11 1521 11/28/11 1446 11/28/11 0621 11/27/11 2158  GLUCAP 102* 113* 127* 67* 117* 78   Hemoglobin A1C: No results found for this basename: HGBA1C in the last 72 hours Fasting Lipid Panel:  Basename 11/28/11 0326  CHOL 132  HDL --  LDLCALC --  TRIG 122  CHOLHDL --  LDLDIRECT --   Thyroid Function Tests: No results found for this basename: TSH,T4TOTAL,FREET4,T3FREE,THYROIDAB in the last 72 hours Anemia Panel: No results found for this basename: VITAMINB12,FOLATE,FERRITIN,TIBC,IRON,RETICCTPCT in the last 72 hours Coagulation: No results found for this basename: LABPROT:2,INR:2 in the last 72 hours Urine Drug Screen: Drugs of Abuse  No results found for this basename: labopia, cocainscrnur, labbenz, amphetmu, thcu, labbarb    Alcohol Level: No results found for this basename: ETH:2 in the last 72 hours Urinalysis: No results found for this basename: COLORURINE:2,APPERANCEUR:2,LABSPEC:2,PHURINE:2,GLUCOSEU:2,HGBUR:2,BILIRUBINUR:2,KETONESUR:2,PROTEINUR:2,UROBILINOGEN:2,NITRITE:2,LEUKOCYTESUR:2 in the last 72 hours Misc. Labs:  No results found for this or any previous visit (from the past 240 hour(s)).  Studies/Results: No results found.  Medications: Scheduled Meds:   . ipratropium  0.5 mg Nebulization Q4H   And  . albuterol  2.5 mg Nebulization Q4H  . antiseptic oral rinse  15 mL Mouth Rinse QID  . chlorhexidine  15 mL Mouth Rinse BID  . DULoxetine  30 mg Oral BID  . famotidine  20 mg Oral QHS  . feeding supplement  237 mL Oral BID BM  . furosemide  40 mg Oral BID  . guaiFENesin  600 mg Oral BID  . heparin subcutaneous  5,000  Units Subcutaneous Q8H  . insulin aspart  0-9 Units Subcutaneous Q8H  . levothyroxine  50 mcg Oral QAC breakfast  . metoprolol tartrate  25 mg Oral BID  . potassium chloride  40 mEq Oral Q4H  . predniSONE  7.5 mg Oral Q breakfast  . sodium chloride  10-40 mL Intracatheter Q12H  . DISCONTD: albuterol-ipratropium  2 puff Inhalation Q4H  . DISCONTD: furosemide  40 mg Intravenous BID  . DISCONTD: hydrocortisone sodium succinate  25 mg Intravenous Q12H  . DISCONTD: levothyroxine  26 mcg Intravenous QAC breakfast  . DISCONTD: metoprolol  5 mg Intravenous Q6H  . DISCONTD: pantoprazole (PROTONIX) IV  40 mg Intravenous Q24H   Continuous Infusions:   . dextrose 5 % and 0.45 % NaCl with KCl 20 mEq/L 20 mL/hr at 11/28/11 1500  . fat emulsion 240 mL (11/28/11 1743)  . TPN (CLINIMIX) +/- additives 70 mL/hr at 11/27/11 1753  . TPN (CLINIMIX) +/- additives 70 mL/hr at 11/28/11 1742   PRN Meds:.acetaminophen, albuterol, ALPRAZolam, diphenhydrAMINE, fentaNYL, ipratropium, ondansetron (ZOFRAN) IV, ondansetron, sodium chloride  Assessment/Plan:  Active Problems:  HYPOTHYROIDISM  Atrial fibrillation  HEART FAILURE  History of hypertension  Respiratory failure following trauma and surgery  Chronic obstructive asthma  Perforated diverticulitis  Peritonitis  Hypotension  Acute respiratory failure  Acute respiratory Failure, post op 6/1>>resolved   COPD/fixed asthma, (Xolair, Singulair, Pred are chronic meds).  Continue oxygen as needed to keep SpO2 > 92% >>Raynaud's phenomena makes pulse ox readings difficult . Continue home dose of prednisone (7.5 mg daily) . nebulizer therapy . Continue montelukast . Results chest xray pending. Will follow.    Afib w/ RVR, chronic systolic CHF (EF 16-10%)  Cardiology following>>defer anticoagulation, antiarrhythmic tx for now . Continue lasix, BB. Strict I & O's. Volume status -4.4L.    AKI / ATN, resolved.   Hypokalemia  Likely related to gi losses. Will  replete and recheck in am.  Mag level WNL  Hyponatremia: mild , improving. Monitor.      Perforated diverticulitis s/p sigmoid colectomy complicated by wound dehisence, and post op ileus.  Advance diet per [facility]>>continue TNA until able to have adequate oral intake .Diet : full liquids 6/6 + TNA . Continue wound vac per [facility].   Anemia of critical illness, and chronic disease  Hg stable in range 8-9. Will monitor closely. Transfuse for Hb < 7   Diverticular abscess due to perforated divertic/ low grade sepsis  Urine culture 5/24 w/15K mult organism. Blood culture x2 5/24 neg. Pt received  Vancomycin 5/24 only then Zosyn 5/24 >>6/3 . Completed course of Abx>>monitor clinically    Hypothyroidism  Continue synthroid.   Pain control  Prn tylenol, fentanyl   DISPOSITION  Continue hospital stay until abdominal process more stable>>defer timing for LTAC evaluation to [facility].    LOS: 18 days   [facility] M 11/29/2011, 8:11 AM

## 2011-11-29 NOTE — Progress Notes (Signed)
10 Days Post-Op  Subjective: Having some nausea today, she has had something.  OK last PM  Objective: Vital signs in last 24 hours: Temp:  [97.7 F (36.5 C)-98.6 F (37 C)] 97.7 F (36.5 C) (06/11 0520) Pulse Rate:  [77-89] 77  (06/11 0520) Resp:  [18] 18  (06/11 0520) BP: (113-128)/(67-70) 113/67 mmHg (06/11 0520) SpO2:  [95 %-98 %] 98 % (06/11 0743) FiO2 (%):  [21 %] 21 % (06/10 1613) Weight:  [78.2 kg (172 lb 6.4 oz)] 78.2 kg (172 lb 6.4 oz) (06/11 0500) Last BM Date:  (Colostomy in place)  800 recorded from colostomy Sunday, only 25 ml recorded yesterday. 3 liter urine output. Afebrile, VSS, K+ 3/1, WBC trending up K+ ordered. Intake/Output from previous day: 06/10 0701 - 06/11 0700 In: 2090 [P.O.:600; I.V.:320; TPN:1170] Out: 3675 [Urine:3650; Stool:25] Intake/Output this shift:    General appearance: alert, cooperative, no distress and still sick at her stomach. Resp: ronchi, some wheezing bilat. GI: still very tender and sore.  distended some.  Vac is in place.Ostomy appears to be working nicely. She just took a few steps with PT  Lab Results:   Basename 11/29/11 0330 11/28/11 0326  WBC 15.3* 13.9*  HGB 8.5* 8.0*  HCT 27.5* 25.8*  PLT 294 263    BMET  Basename 11/29/11 0330 11/28/11 0326  NA 134* 132*  K 3.1* 3.5  CL 95* 94*  CO2 31 31  GLUCOSE 102* 131*  BUN 35* 35*  CREATININE 0.97 0.93  CALCIUM 9.2 9.2   PT/INR No results found for this basename: LABPROT:2,INR:2 in the last 72 hours   Lab 11/28/11 0326 11/24/11 0400  AST 26 14  ALT 30 11  ALKPHOS 92 55  BILITOT 0.1* 0.2*  PROT 5.4* 5.2*  ALBUMIN 2.0* 1.9*     Lipase  No results found for this basename: lipase     Studies/Results: Dg Abd 1 View  11/29/2011  *RADIOLOGY REPORT*  Clinical Data: Abdominal pain and nausea.  Recent surgery for perforated diverticulitis.  ABDOMEN - 1 VIEW  Comparison: 11/11/2011  Findings: A percutaneous gastrostomy tube is seen overlying the stomach.  A small  amount of contrast is seen in the rectosigmoid colon.  Both small bowel and colonic gas is seen, without evidence of significant dilatation.  Severe lumbar spondylosis and dextroscoliosis again noted.  IMPRESSION: Nonspecific, nonobstructive bowel gas pattern.  Original Report Authenticated By: Danae Orleans, M.D.   Dg Chest Port 1 View  11/29/2011  *RADIOLOGY REPORT*  Clinical Data: Shortness of breath, cough  PORTABLE CHEST - 1 VIEW  Comparison: 11/23/2011  Findings: Dual lead left pacer noted.  Left upper lobe pulmonary nodule is stable with density compatible with granuloma.  Left breast and axillary clips again noted.  Heart size is normal.  The lungs are grossly clear otherwise.  No pleural effusion.  Right- sided PICC line tip over the mid SVC.  Right IJ line has been removed.  No pneumothorax.  IMPRESSION: No new acute abnormality.  Original Report Authenticated By: Harrel Lemon, M.D.    Medications:    . ipratropium  0.5 mg Nebulization Q4H   And  . albuterol  2.5 mg Nebulization Q4H  . antiseptic oral rinse  15 mL Mouth Rinse QID  . chlorhexidine  15 mL Mouth Rinse BID  . DULoxetine  30 mg Oral BID  . famotidine  20 mg Oral QHS  . feeding supplement  237 mL Oral BID BM  . furosemide  40 mg Oral BID  . guaiFENesin  600 mg Oral BID  . heparin subcutaneous  5,000 Units Subcutaneous Q8H  . insulin aspart  0-9 Units Subcutaneous Q8H  . levothyroxine  50 mcg Oral QAC breakfast  . metoprolol tartrate  25 mg Oral BID  . potassium chloride  40 mEq Oral Q4H  . predniSONE  7.5 mg Oral Q breakfast  . sodium chloride  10-40 mL Intracatheter Q12H  . DISCONTD: albuterol-ipratropium  2 puff Inhalation Q4H  . DISCONTD: furosemide  40 mg Intravenous BID  . DISCONTD: hydrocortisone sodium succinate  25 mg Intravenous Q12H  . DISCONTD: levothyroxine  26 mcg Intravenous QAC breakfast  . DISCONTD: metoprolol  5 mg Intravenous Q6H  . DISCONTD: pantoprazole (PROTONIX) IV  40 mg Intravenous Q24H     Assessment/Plan EXPLORATORY LAPAROTOMY, COLON RESECTION SIGMOID INCISION AND DRAINAGE ABSCESS for perforated diverticulitis - 11/12/2011 - Dr. Trude Mcburney  Wound dehiscence - Closed with Strattice mesh and VAC - D. Newman - 11/19/2011  Atrial fibrillation - seen by Dr. Burna Forts  On amiodarone, and chronic anticoagulation History of HEART FAILURE EF 30-35%  Chronic obstructive asthma, steroid dependant CCM/Medicine following  HYPOTHYROIDISM  On isolation for MRSA\  On TNA For malnutrition  CCM, is interested in LTAC,    Plan:  She  Doesn't really want anything now, but we will advance her to a low residual diet, as tolerated.  If she does well we can plan to wean TNA. Prealbumin 20.6  LOS: 18 days    JENNINGS,WILLARD 11/29/2011    General surgery attending note:  Patient interviewed and examined. Hospital course to date reviewed. I agree with assessment and treatment plan as above.  Patient is alert but profoundly deconditioneddue to her numerous medical and surgical problems as well as her steroid  dependence.  Her abdomen is actually quite soft and benign appearing, although protruberant.    Her wound looks good.   she has no appetite and  is still dependent on TNA. Hopefully her appetite will improve and she can be weaned off of TNA in the next few days.  Continue discharge planning for LTAC, probably.   Angelia Mould. Derrell Lolling, M.D., [facility] General and Minimally invasive Surgery Breast and Colorectal Surgery Office:   [phone] Pager:   [phone]

## 2011-11-29 NOTE — Progress Notes (Signed)
Physical Therapy Treatment Patient Details Name: [name] Standing MRN: [id number] DOB: January 18, 1934 Today's Date: 11/29/2011 Time: 1100-1120 PT Time Calculation (min): 20 min  PT Assessment / Plan / Recommendation Comments on Treatment Session  Spouse in room during session eager to see pt get OOB and walk.  Pt however required MAX encouragement to participate with mild c/o nausea.  Assisted pt OOB to amb limited distance, then to recliner. Pt plans to D/C to LTAC or SNF.    Follow Up Recommendations  Skilled nursing facility;LTACH    Barriers to Discharge        Equipment Recommendations  Defer to next venue    Recommendations for Other Services    Frequency Min 3X/week   Plan Discharge plan remains appropriate    Precautions / Restrictions   wound vac  Pertinent Vitals/Pain C/o mild nausea with activity    Mobility  Bed Mobility Bed Mobility: Supine to Sit Supine to Sit: 3: Mod assist Details for Bed Mobility Assistance: HOB increased 50' and increased time Transfers Transfers: Sit to Stand;Stand to Sit Sit to Stand: 1: +2 Total assist;From elevated surface;From bed Sit to Stand: Patient Percentage: 50% Stand to Sit: 1: +2 Total assist;To chair/3-in-1 Stand to Sit: Patient Percentage: 60% Details for Transfer Assistance: MAX encouragement and increased time.  Used EVA walker for increased support as pt c/o max weakness/fatique Ambulation/Gait Ambulation/Gait Assistance: 1: +2 Total assist Ambulation/Gait: Patient Percentage: 50% Ambulation Distance (Feet): 6 Feet Assistive device: Carley Hammed walker Ambulation/Gait Assistance Details: Used Fara Boros for increased support.  Very limited activity tolerance.  B knees buckling. Chair following closely behind. Gait Pattern: Step-to pattern;Trunk flexed Stairs: No Wheelchair Mobility Wheelchair Mobility: No    :     PT Goals   progressing    Visit Information  Last PT Received On: 11/29/11 Assistance Needed: +2                   End of Session PT - End of Session Equipment Utilized During Treatment: Gait belt Activity Tolerance: Patient limited by fatigue;Treatment limited secondary to medical complications (Comment) (very deconditioned with increased length of stay) Patient left: in chair;with call bell/phone within reach;with family/visitor present Nurse Communication: Mobility status;Need for lift equipment Michiel Sites Pad under pt)   Felecia Shelling  PTA [facility]  Acute  Rehab Pager     [phone]

## 2011-11-29 NOTE — Progress Notes (Signed)
PARENTERAL NUTRITION CONSULT NOTE  Pharmacy Consult for TNA Indication: Sigmoid Rupture/Colostomy  Allergies  Allergen Reactions  . Aspirin   . Ibuprofen   . Iron Dextran     REACTION: itching after test dose  . Metoclopramide Hcl     REACTION: chomping mouth  . Sulfonamide Derivatives    Patient Measurements: Height: 5\' 3"  (160 cm) Weight: 172 lb 6.4 oz (78.2 kg) IBW/kg (Calculated) : 52.4  Adjusted Body Weight: 62kg Usual Weight: 74kg  Vital Signs: Temp: 97.7 F (36.5 C) (06/11 0520) Temp src: Oral (06/11 0520) BP: 113/67 mmHg (06/11 0520) Pulse Rate: 77  (06/11 0520) Intake/Output from previous day: 06/10 0701 - 06/11 0700 In: 2090 [P.O.:600; I.V.:320; TPN:1170] Out: 3675 [Urine:3650; Stool:25]  Labs:  [facility] 11/29/11 0330 11/28/11 0326  WBC 15.3* 13.9*  HGB 8.5* 8.0*  HCT 27.5* 25.8*  PLT 294 263  APTT -- --  INR -- --     Basename 11/29/11 0330 11/28/11 0326 11/27/11 0400  NA 134* 132* 132*  K 3.1* 3.5 4.0  CL 95* 94* 94*  CO2 31 31 30   GLUCOSE 102* 131* 135*  BUN 35* 35* 31*  CREATININE 0.97 0.93 0.90  LABCREA -- -- --  CREAT24HRUR -- -- --  CALCIUM 9.2 9.2 8.9  MG -- 1.8 --  PHOS -- 3.0 --  PROT -- 5.4* --  ALBUMIN -- 2.0* --  AST -- 26 --  ALT -- 30 --  ALKPHOS -- 92 --  BILITOT -- 0.1* --  BILIDIR -- -- --  IBILI -- -- --  PREALBUMIN -- 20.6 --  TRIG -- 122 --  CHOLHDL -- -- --  CHOL -- 132 --   Estimated Creatinine Clearance: 49.6 ml/min (by C-G formula based on Cr of 0.97).    Basename 11/29/11 0533 11/28/11 2058 11/28/11 1521  GLUCAP 102* 113* 127*    Medications:  Scheduled:     . ipratropium  0.5 mg Nebulization Q4H   And  . albuterol  2.5 mg Nebulization Q4H  . antiseptic oral rinse  15 mL Mouth Rinse QID  . chlorhexidine  15 mL Mouth Rinse BID  . DULoxetine  30 mg Oral BID  . famotidine  20 mg Oral QHS  . feeding supplement  237 mL Oral BID BM  . furosemide  40 mg Oral BID  . guaiFENesin  600 mg Oral BID  .  heparin subcutaneous  5,000 Units Subcutaneous Q8H  . insulin aspart  0-9 Units Subcutaneous Q8H  . levothyroxine  50 mcg Oral QAC breakfast  . metoprolol tartrate  25 mg Oral BID  . potassium chloride  40 mEq Oral Q4H  . predniSONE  7.5 mg Oral Q breakfast  . sodium chloride  10-40 mL Intracatheter Q12H  . DISCONTD: albuterol-ipratropium  2 puff Inhalation Q4H  . DISCONTD: furosemide  40 mg Intravenous BID  . DISCONTD: hydrocortisone sodium succinate  25 mg Intravenous Q12H  . DISCONTD: levothyroxine  26 mcg Intravenous QAC breakfast  . DISCONTD: metoprolol  5 mg Intravenous Q6H  . DISCONTD: pantoprazole (PROTONIX) IV  40 mg Intravenous Q24H    Insulin Requirements in the past 24 hours:  CBG's 67-127 Sensitive SSI - 0 units given  Current Nutrition:  FLD (started 6/8) --> 600 mL intake recorded for yesterday Clinimix 5/15 at 70 ml/hr + Lipids MWF IVF: D5 0.45NaCl 20KCl at [facility]  Nutritional Goals:  Per RD 6/2 note: 1400-1500 Kcal and 75-85 grams protein/day.  TPN to meet > 90% estimated  needs.    Clinimix 5/15 at goal rate 70 ml/hr will provide on average 1398 Kcal and 84 grams protein/day (1672 kcal/day with lipids, 1193 kcal/day without)  Assessment:  78 yo Female with perforated diverticular abscess; resection, abscess drainage 5/25. Underwent wound closure/mesh 6/2.   At risk for re-feeding syndrome with prolonged NPO.  Has been tolerating TNA at goal rate and pre-albumin has improved (now WNL).  PO intake improving - f/u plans for weaning off TNA.  Avoid volume overload with HF.  No history of DM, on Hydrocortisone IV, and chronic steroid use PTA for COPD.  GI PPx: Protonix IV Electrolytes/Labs:  Na low - unable to adjust Na-content in Clinimix product.  K+ 3.1 this AM, already has been supplemented  Mg and Phos WNL  AST/ALT WNL  Cholesterol and TG WNL  Pre-Albumin 20.6 (6/10), 10.4 (6/3), Albumin low  CBG's WNL  Plan: At 1800 today  Continue Clinimix E  5/15 at goal rate 70 ml/hr until MD order to start wean.  Fat emulsion at 10 ml/hr (MWF only due to ongoing shortage).  TNA to contain standard multivitamins and trace elements (MWF only due to ongoing shortage).  Continue CBGs and sensitive slide scale insulin at q8h  Follow up diet and PO intake and plan for weaning off TNA.  TNA lab panels on Mondays & Thursdays.  F/u daily.  Clance Boll, PharmD, BCPS Pager: [phone] 11/29/2011 10:50 AM

## 2011-11-30 DIAGNOSIS — E876 Hypokalemia: Secondary | ICD-10-CM

## 2011-11-30 DIAGNOSIS — J45909 Unspecified asthma, uncomplicated: Secondary | ICD-10-CM

## 2011-11-30 DIAGNOSIS — J438 Other emphysema: Secondary | ICD-10-CM

## 2011-11-30 DIAGNOSIS — K631 Perforation of intestine (nontraumatic): Secondary | ICD-10-CM

## 2011-11-30 LAB — CBC
MCH: 29.3 pg (ref 26.0–34.0)
MCHC: 32.2 g/dL (ref 30.0–36.0)
Platelets: 256 10*3/uL (ref 150–400)
RBC: 2.94 MIL/uL — ABNORMAL LOW (ref 3.87–5.11)
RDW: 15 % (ref 11.5–15.5)

## 2011-11-30 LAB — BASIC METABOLIC PANEL
Calcium: 9.5 mg/dL (ref 8.4–10.5)
GFR calc non Af Amer: 55 mL/min — ABNORMAL LOW (ref >90)
Glucose, Bld: 111 mg/dL — ABNORMAL HIGH (ref 70–99)
Sodium: 129 mEq/L — ABNORMAL LOW (ref 135–145)

## 2011-11-30 LAB — GLUCOSE, CAPILLARY: Glucose-Capillary: 101 mg/dL — ABNORMAL HIGH (ref 70–99)

## 2011-11-30 MED ORDER — ZINC TRACE METAL 1 MG/ML IV SOLN
INTRAVENOUS | Status: DC
  Administered 2011-11-30: 18:00:00 via INTRAVENOUS
  Filled 2011-11-30: qty 2000

## 2011-11-30 MED ORDER — FAT EMULSION 20 % IV EMUL
240.0000 mL | INTRAVENOUS | Status: DC
  Administered 2011-11-30: 240 mL via INTRAVENOUS
  Filled 2011-11-30: qty 250

## 2011-11-30 MED ORDER — IPRATROPIUM BROMIDE 0.02 % IN SOLN
0.5000 mg | Freq: Three times a day (TID) | RESPIRATORY_TRACT | Status: DC
  Administered 2011-11-30 – 2011-12-02 (×6): 0.5 mg via RESPIRATORY_TRACT
  Filled 2011-11-30 (×4): qty 2.5

## 2011-11-30 MED ORDER — IPRATROPIUM BROMIDE 0.02 % IN SOLN: 0.5000 mg | Freq: Three times a day (TID) | RESPIRATORY_TRACT | Status: DC

## 2011-11-30 MED ORDER — ALBUTEROL SULFATE (5 MG/ML) 0.5% IN NEBU: 2.5000 mg | INHALATION_SOLUTION | RESPIRATORY_TRACT | Status: DC

## 2011-11-30 MED ORDER — ALBUTEROL SULFATE (5 MG/ML) 0.5% IN NEBU
2.5000 mg | INHALATION_SOLUTION | Freq: Three times a day (TID) | RESPIRATORY_TRACT | Status: DC
  Administered 2011-11-30 – 2011-12-02 (×6): 2.5 mg via RESPIRATORY_TRACT
  Filled 2011-11-30 (×5): qty 0.5

## 2011-11-30 MED ORDER — CEFTRIAXONE SODIUM 2 G IJ SOLR
2.0000 g | INTRAMUSCULAR | Status: DC
  Administered 2011-11-30 – 2011-12-01 (×2): 2 g via INTRAVENOUS
  Filled 2011-11-30 (×2): qty 2

## 2011-11-30 NOTE — Consult Note (Signed)
WOC follow up Note Follow up for NPWT dressing change.  LLQ colostomy bag currently intact with a good seal, no change required. Wound type:  Full thickness surgical incision.  Measurement: 26 x 9.0 x 4.0 cm, area mesh measures 10.0 x 7.0 cm.   Wound bed: Moist wound bed, serosanguinous drainage of small amount in suction tubing. No odor noted.   Periwound: intact Dressing procedure/placement/frequency: One piece of white foam placed over mesh, 6 piece of black foam used.  1 place over white foam, 1 place above 1st rentention suture.  2 black pieces placed between 2nd and 3rd retention sutures.  2 placed below last retention sutures\  Plan to change next wound vac on Friday 12/02/11.  Supplies at bedside for nurse to used.  Tolerated dressing change well.  Suction at 100 mmHG without air leaks.   Reynold Bowen RN, BSN, WOC Nurse  Cammie Mcgee, RN, MSN, [employer]  [phone]

## 2011-11-30 NOTE — Progress Notes (Signed)
11 Days Post-Op  Subjective: She is feeling better, no nausea, wanted some real food, but kitchen says all they have is full liquid diet. Dressing change, shows very slow improvement and granulation over the graft, some in the base of the graft at the top of the mesh.  Objective: Vital signs in last 24 hours: Temp:  [97.9 F (36.6 C)-100 F (37.8 C)] 97.9 F (36.6 C) (06/12 0548) Pulse Rate:  [77-90] 81  (06/12 1015) Resp:  [18-20] 18  (06/12 0548) BP: (106-132)/(61-71) 123/71 mmHg (06/12 1015) SpO2:  [95 %] 95 % (06/12 0548) FiO2 (%):  [21 %] 21 % (06/12 0837) Weight:  [75.5 kg (166 lb 7.2 oz)] 75.5 kg (166 lb 7.2 oz) (06/12 0500) Last BM Date: 11/29/11  Intake/Output from previous day: 06/11 0701 - 06/12 0700 In: 3211 [P.O.:340; I.V.:638; ZOX:0960] Out: 4601 [Urine:4575; Stool:26] Intake/Output this shift:    General appearance: alert, cooperative and no distress GI: Open wound, looks pretty good.  the retention sutures, are starting to develop red areas.  I'm told that is considered a hospital issue, so we need to decide on removing them.  Over all the wound looks good, and the colostomy is functioning well.  Lab Results:   Basename 11/30/11 0250 11/29/11 0330  WBC 17.7* 15.3*  HGB 8.6* 8.5*  HCT 26.7* 27.5*  PLT 256 294    BMET  Basename 11/30/11 0250 11/29/11 0330  NA 129* 134*  K 4.1 3.1*  CL 90* 95*  CO2 33* 31  GLUCOSE 111* 102*  BUN 37* 35*  CREATININE 0.98 0.97  CALCIUM 9.5 9.2   PT/INR No results found for this basename: LABPROT:2,INR:2 in the last 72 hours   Lab 11/28/11 0326 11/24/11 0400  AST 26 14  ALT 30 11  ALKPHOS 92 55  BILITOT 0.1* 0.2*  PROT 5.4* 5.2*  ALBUMIN 2.0* 1.9*     Lipase  No results found for this basename: lipase     Studies/Results: Dg Abd 1 View  11/29/2011  *RADIOLOGY REPORT*  Clinical Data: Abdominal pain and nausea.  Recent surgery for perforated diverticulitis.  ABDOMEN - 1 VIEW  Comparison: 11/11/2011   Findings: A percutaneous gastrostomy tube is seen overlying the stomach.  A small amount of contrast is seen in the rectosigmoid colon.  Both small bowel and colonic gas is seen, without evidence of significant dilatation.  Severe lumbar spondylosis and dextroscoliosis again noted.  IMPRESSION: Nonspecific, nonobstructive bowel gas pattern.  Original Report Authenticated By: Danae Orleans, M.D.   Dg Chest Port 1 View  11/29/2011  *RADIOLOGY REPORT*  Clinical Data: Shortness of breath, cough  PORTABLE CHEST - 1 VIEW  Comparison: 11/23/2011  Findings: Dual lead left pacer noted.  Left upper lobe pulmonary nodule is stable with density compatible with granuloma.  Left breast and axillary clips again noted.  Heart size is normal.  The lungs are grossly clear otherwise.  No pleural effusion.  Right- sided PICC line tip over the mid SVC.  Right IJ line has been removed.  No pneumothorax.  IMPRESSION: No new acute abnormality.  Original Report Authenticated By: Harrel Lemon, M.D.    Medications:    . albuterol  2.5 mg Nebulization TID   And  . ipratropium  0.5 mg Nebulization TID  . antiseptic oral rinse  15 mL Mouth Rinse QID  . cefTRIAXone (ROCEPHIN)  IV  2 g Intravenous Q24H  . chlorhexidine  15 mL Mouth Rinse BID  . DULoxetine  30  mg Oral BID  . famotidine  20 mg Oral QHS  . feeding supplement  237 mL Oral BID BM  . guaiFENesin  600 mg Oral BID  . heparin subcutaneous  5,000 Units Subcutaneous Q8H  . insulin aspart  0-9 Units Subcutaneous Q8H  . levothyroxine  50 mcg Oral QAC breakfast  . metoprolol tartrate  25 mg Oral BID  . montelukast  10 mg Oral QHS  . potassium chloride  40 mEq Oral Q4H  . predniSONE  7.5 mg Oral Q breakfast  . sodium chloride  10-40 mL Intracatheter Q12H  . DISCONTD: albuterol  2.5 mg Nebulization Q4H  . DISCONTD: albuterol  2.5 mg Nebulization Q4H  . DISCONTD: furosemide  40 mg Oral BID  . DISCONTD: ipratropium  0.5 mg Nebulization Q4H  . DISCONTD:  ipratropium  0.5 mg Nebulization TID    Assessment/Plan EXPLORATORY LAPAROTOMY, COLON RESECTION SIGMOID INCISION AND DRAINAGE ABSCESS for perforated diverticulitis - 11/12/2011 - Dr. Trude Mcburney  Wound dehiscence - Closed with Strattice mesh and VAC - D. Newman - 11/19/2011  Atrial fibrillation - seen by Dr. Burna Forts  On amiodarone, and chronic anticoagulation History of HEART FAILURE EF 30-35%  Chronic obstructive asthma, steroid dependant CCM/Medicine following  Raynaud syndrome HYPOTHYROIDISM  On isolation for MRSA\  On TNA For malnutrition  CCMis interested in LTAC, Medicine awaiting our OK to go to [facility].   Plan:  Slow progress, advance diet, see if we can wean TNA.  I will discuss with DR. Gerkin.    LOS: 19 days    Maylani Embree 11/30/2011

## 2011-11-30 NOTE — Progress Notes (Signed)
Subjective: Eyes closed lying in bed. Easily aroused. No complaints. Reports nausea improved. NAD  Objective: Vital signs Filed Vitals:   11/29/11 1445 11/29/11 2057 11/30/11 0500 11/30/11 0548  BP: 106/68 132/63    Pulse: 90 83  77  Temp: 100 F (37.8 C) 99.3 F (37.4 C)  97.9 F (36.6 C)  TempSrc: Oral Oral  Oral  Resp: 20 18  18   Height:      Weight:   75.5 kg (166 lb 7.2 oz)   SpO2: 95%   95%   Weight change: -2.7 kg (-5 lb 15.2 oz) Last BM Date: 11/29/11 (colostomy)  Intake/Output from previous day: 06/11 0701 - 06/12 0700 In: 3211 [P.O.:340; I.V.:638; ZOX:0960] Out: 4601 [Urine:4575; Stool:26]     Physical Exam: General: Alert, awake, oriented x3, in no acute distress. HEENT: No bruits, no goiter. PERRL mucus membranes mouth moist/pink.  Heart: Regular rate and rhythm, without murmurs, rubs, gallops. Lungs:Normal effort. Breath sounds with rhonchi. No wheeze. Abdomen: Soft,  Mild tenderness, wound vac with open wound intact, colostomy bag sm amount brown liquid stool.  positive bowel sounds. Extremities: No clubbing cyanosis or edema with positive pedal pulses. Neuro: Grossly intact, nonfocal. Speech clear.     Lab Results: Basic Metabolic Panel:  Basename 11/30/11 0250 11/29/11 0330 11/28/11 0326  NA 129* 134* --  K 4.1 3.1* --  CL 90* 95* --  CO2 33* 31 --  GLUCOSE 111* 102* --  BUN 37* 35* --  CREATININE 0.98 0.97 --  CALCIUM 9.5 9.2 --  MG -- -- 1.8  PHOS -- -- 3.0   Liver Function Tests:  Basename 11/28/11 0326  AST 26  ALT 30  ALKPHOS 92  BILITOT 0.1*  PROT 5.4*  ALBUMIN 2.0*   No results found for this basename: LIPASE:2,AMYLASE:2 in the last 72 hours No results found for this basename: AMMONIA:2 in the last 72 hours CBC:  Basename 11/30/11 0250 11/29/11 0330 11/28/11 0326  WBC 17.7* 15.3* --  NEUTROABS -- -- 12.4*  HGB 8.6* 8.5* --  HCT 26.7* 27.5* --  MCV 90.8 91.7 --  PLT 256 294 --   Cardiac Enzymes: No results found for  this basename: CKTOTAL:3,CKMB:3,CKMBINDEX:3,TROPONINI:3 in the last 72 hours BNP: No results found for this basename: PROBNP:3 in the last 72 hours D-Dimer: No results found for this basename: DDIMER:2 in the last 72 hours CBG:  Basename 11/30/11 0546 11/29/11 2152 11/29/11 1632 11/29/11 0533 11/28/11 2058 11/28/11 1521  GLUCAP 73 97 101* 102* 113* 127*   Hemoglobin A1C: No results found for this basename: HGBA1C in the last 72 hours Fasting Lipid Panel:  Basename 11/28/11 0326  CHOL 132  HDL --  LDLCALC --  TRIG 122  CHOLHDL --  LDLDIRECT --   Thyroid Function Tests: No results found for this basename: TSH,T4TOTAL,FREET4,T3FREE,THYROIDAB in the last 72 hours Anemia Panel: No results found for this basename: VITAMINB12,FOLATE,FERRITIN,TIBC,IRON,RETICCTPCT in the last 72 hours Coagulation: No results found for this basename: LABPROT:2,INR:2 in the last 72 hours Urine Drug Screen: Drugs of Abuse  No results found for this basename: labopia, cocainscrnur, labbenz, amphetmu, thcu, labbarb    Alcohol Level: No results found for this basename: ETH:2 in the last 72 hours Urinalysis:  Basename 11/29/11 1207  COLORURINE YELLOW  LABSPEC 1.013  PHURINE 7.0  GLUCOSEU NEGATIVE  HGBUR NEGATIVE  BILIRUBINUR NEGATIVE  KETONESUR NEGATIVE  PROTEINUR NEGATIVE  UROBILINOGEN 0.2  NITRITE NEGATIVE  LEUKOCYTESUR LARGE*   Misc. Labs:  No results found for  this or any previous visit (from the past 240 hour(s)).  Studies/Results: Dg Abd 1 View  11/29/2011  *RADIOLOGY REPORT*  Clinical Data: Abdominal pain and nausea.  Recent surgery for perforated diverticulitis.  ABDOMEN - 1 VIEW  Comparison: 11/11/2011  Findings: A percutaneous gastrostomy tube is seen overlying the stomach.  A small amount of contrast is seen in the rectosigmoid colon.  Both small bowel and colonic gas is seen, without evidence of significant dilatation.  Severe lumbar spondylosis and dextroscoliosis again noted.   IMPRESSION: Nonspecific, nonobstructive bowel gas pattern.  Original Report Authenticated By: Danae Orleans, M.D.   Dg Chest Port 1 View  11/29/2011  *RADIOLOGY REPORT*  Clinical Data: Shortness of breath, cough  PORTABLE CHEST - 1 VIEW  Comparison: 11/23/2011  Findings: Dual lead left pacer noted.  Left upper lobe pulmonary nodule is stable with density compatible with granuloma.  Left breast and axillary clips again noted.  Heart size is normal.  The lungs are grossly clear otherwise.  No pleural effusion.  Right- sided PICC line tip over the mid SVC.  Right IJ line has been removed.  No pneumothorax.  IMPRESSION: No new acute abnormality.  Original Report Authenticated By: Harrel Lemon, M.D.    Medications: Scheduled Meds:   . ipratropium  0.5 mg Nebulization Q4H   And  . albuterol  2.5 mg Nebulization Q4H  . antiseptic oral rinse  15 mL Mouth Rinse QID  . cefTRIAXone (ROCEPHIN)  IV  2 g Intravenous Q24H  . chlorhexidine  15 mL Mouth Rinse BID  . DULoxetine  30 mg Oral BID  . famotidine  20 mg Oral QHS  . feeding supplement  237 mL Oral BID BM  . furosemide  40 mg Oral BID  . guaiFENesin  600 mg Oral BID  . heparin subcutaneous  5,000 Units Subcutaneous Q8H  . insulin aspart  0-9 Units Subcutaneous Q8H  . levothyroxine  50 mcg Oral QAC breakfast  . metoprolol tartrate  25 mg Oral BID  . montelukast  10 mg Oral QHS  . potassium chloride  40 mEq Oral Q4H  . predniSONE  7.5 mg Oral Q breakfast  . sodium chloride  10-40 mL Intracatheter Q12H   Continuous Infusions:   . dextrose 5 % and 0.45 % NaCl with KCl 20 mEq/L 20 mL/hr at 11/29/11 1618  . fat emulsion 240 mL (11/28/11 1743)  . TPN (CLINIMIX) +/- additives 70 mL/hr at 11/28/11 1742  . TPN (CLINIMIX) +/- additives 70 mL/hr at 11/29/11 1703   PRN Meds:.acetaminophen, albuterol, ALPRAZolam, diphenhydrAMINE, fentaNYL, ipratropium, ondansetron (ZOFRAN) IV, ondansetron, sodium chloride  Assessment/Plan:  Principal Problem:   *Perforated diverticulitis Active Problems:  HYPOTHYROIDISM  Atrial fibrillation  HEART FAILURE  History of hypertension  Respiratory failure following trauma and surgery  Chronic obstructive asthma  Peritonitis  Hypotension  Acute respiratory failure Acute respiratory Failure, post op 6/1>>resolved   COPD/fixed asthma, (Xolair, Singulair, Pred are chronic meds). Continue oxygen as needed to keep SpO2 > 92% >>Raynaud's phenomena makes pulse ox readings difficult . Continue home dose of prednisone (7.5 mg daily) . nebulizer therapy . Continue montelukast . Results chest xray 11/29/11 neg.  Afib w/ RVR, chronic systolic CHF (EF 16-10%)  Cardiology following>>defer anticoagulation, antiarrhythmic tx for now . Continue lasix, BB. Strict I & O's. Volume status -5.8L.   AKI / ATN, resolved.   Hypokalemia  Likely related to gi losses. Repleted and resoved. Will monitor. Mag level WNL   Hyponatremia: Worsening today.  Likely related to lasix. May be a little too dry. Volume -5.6L. Will hold lasix today.  Will check urine sodium, osmolality.  Monitor.   Perforated diverticulitis s/p sigmoid colectomy complicated by wound dehisence, and post op ileus.  Advance diet per CCS>>continue TNA until able to have adequate oral intake .Diet : full liquids 6/6 + TNA . Continue wound vac per CCS.   Anemia of critical illness, and chronic disease  Hg stable in range 8-9. Will monitor closely. Transfuse for Hb < 7  Diverticular abscess due to perforated divertic/ low grade sepsis  Urine culture 5/24 w/15K mult organism. Blood culture x2 5/24 neg. Pt received Vancomycin 5/24 only then Zosyn 5/24 >>6/3 . Completed course of Abx>>monitor clinically   Leukocytosis: afebrile, non-toxic appearing, chest xray yesterday without acute changes urine with likely UTI. Pt refusing to have foley removed. Will start Rocephin. Urine culture pending. Monitor  Hypothyroidism  Continue synthroid.  Pain control  Prn  tylenol, fentanyl  DISPOSITION  Continue hospital stay until abdominal process more stable>>defer timing for LTAC evaluation to [facility].  LOS: 18 days     LOS: 19 days   [facility] 11/30/2011, 8:18 AM

## 2011-11-30 NOTE — Progress Notes (Signed)
General Surgery [facility] - Attending  Patient seen and dicussed with her family at bedside.  Anticipate transfer to LTAC at [facility] tomorrow.  May remove retention sutures with next VAC change.  Velora Heckler, MD, [facility] Office: [phone]

## 2011-11-30 NOTE — Progress Notes (Signed)
PARENTERAL NUTRITION CONSULT NOTE  Pharmacy Consult for TNA Indication: Sigmoid Rupture/Colostomy  Allergies  Allergen Reactions  . Aspirin   . Ibuprofen   . Iron Dextran     REACTION: itching after test dose  . Metoclopramide Hcl     REACTION: chomping mouth  . Sulfonamide Derivatives    Patient Measurements: Height: 5\' 3"  (160 cm) Weight: 166 lb 7.2 oz (75.5 kg) IBW/kg (Calculated) : 52.4  Adjusted Body Weight: 62kg Usual Weight: 74kg  Vital Signs: Temp: 97.9 F (36.6 C) (06/12 0548) Temp src: Oral (06/12 0548) BP: 132/63 mmHg (06/11 2057) Pulse Rate: 77  (06/12 0548) Intake/Output from previous day: 06/11 0701 - 06/12 0700 In: 3211 [P.O.:340; I.V.:638; OZH:0865] Out: 4601 [Urine:4575; Stool:26]  Labs:  [facility] 11/30/11 0250 11/29/11 0330 11/28/11 0326  WBC 17.7* 15.3* 13.9*  HGB 8.6* 8.5* 8.0*  HCT 26.7* 27.5* 25.8*  PLT 256 294 263  APTT -- -- --  INR -- -- --     Basename 11/30/11 0250 11/29/11 0330 11/28/11 0326  NA 129* 134* 132*  K 4.1 3.1* 3.5  CL 90* 95* 94*  CO2 33* 31 31  GLUCOSE 111* 102* 131*  BUN 37* 35* 35*  CREATININE 0.98 0.97 0.93  LABCREA -- -- --  CREAT24HRUR -- -- --  CALCIUM 9.5 9.2 9.2  MG -- -- 1.8  PHOS -- -- 3.0  PROT -- -- 5.4*  ALBUMIN -- -- 2.0*  AST -- -- 26  ALT -- -- 30  ALKPHOS -- -- 92  BILITOT -- -- 0.1*  BILIDIR -- -- --  IBILI -- -- --  PREALBUMIN -- -- 20.6  TRIG -- -- 122  CHOLHDL -- -- --  CHOL -- -- 132   Estimated Creatinine Clearance: 48.2 ml/min (by C-G formula based on Cr of 0.98).    Basename 11/30/11 0546 11/29/11 2152 11/29/11 1632  GLUCAP 73 97 101*    Medications:  Scheduled:     . ipratropium  0.5 mg Nebulization Q4H   And  . albuterol  2.5 mg Nebulization Q4H  . antiseptic oral rinse  15 mL Mouth Rinse QID  . cefTRIAXone (ROCEPHIN)  IV  2 g Intravenous Q24H  . chlorhexidine  15 mL Mouth Rinse BID  . DULoxetine  30 mg Oral BID  . famotidine  20 mg Oral QHS  . feeding  supplement  237 mL Oral BID BM  . furosemide  40 mg Oral BID  . guaiFENesin  600 mg Oral BID  . heparin subcutaneous  5,000 Units Subcutaneous Q8H  . insulin aspart  0-9 Units Subcutaneous Q8H  . levothyroxine  50 mcg Oral QAC breakfast  . metoprolol tartrate  25 mg Oral BID  . montelukast  10 mg Oral QHS  . potassium chloride  40 mEq Oral Q4H  . predniSONE  7.5 mg Oral Q breakfast  . sodium chloride  10-40 mL Intracatheter Q12H    Insulin Requirements in the past 24 hours:  CBG's 73-102 Sensitive SSI - 0 units given  Current Nutrition:  FLD (started 6/8) --> 340 mL intake recorded for yesterday, decreased from 600 mL on 6/10 Clinimix 5/15 at 70 ml/hr + Lipids MWF IVF: D5 0.45NaCl 20KCl at [facility]  Nutritional Goals:  Per RD 6/2 note: 1400-1500 Kcal and 75-85 grams protein/day.  TPN to meet > 90% estimated needs.    Clinimix 5/15 at goal rate 70 ml/hr will provide on average 1398 Kcal and 84 grams protein/day (1672 kcal/day with lipids, 1193  kcal/day without)  Assessment:  78 yo Female with perforated diverticular abscess; resection, abscess drainage 5/25. Underwent wound closure/mesh 6/2.   Has been tolerating TNA at goal rate and pre-albumin has improved (now WNL).  Per MD progress notes, patient's appetite is poor.  Decreasing TNA from goal rate may help improve appetite.  Avoid volume overload with HF.  No history of DM, on Hydrocortisone IV, and chronic steroid use PTA for COPD.  GI PPx: Protonix IV Electrolytes/Labs:  Na low - unable to adjust Na-content in Clinimix product.  K+ WNL after supplementation yesterday  Mg and Phos WNL  AST/ALT WNL  Cholesterol and TG WNL  Pre-Albumin 20.6 (6/10), 10.4 (6/3), Albumin low  CBG's WNL  Plan: At 1800 today  Continue Clinimix E 5/15 at goal rate 70 ml/hr until MD order to start wean.  Fat emulsion at 10 ml/hr (MWF only due to ongoing shortage).  TNA to contain standard multivitamins and trace elements (MWF only due  to ongoing shortage).  Continue CBGs and sensitive slide scale insulin at q8h  Follow up diet and PO intake and plan for weaning off TNA.  TNA lab panels on Mondays & Thursdays.  F/u daily.  Clance Boll, PharmD, BCPS Pager: [phone] 11/30/2011 8:18 AM

## 2011-11-30 NOTE — Progress Notes (Signed)
Pt was seen and examined at bedside and is hemodynamically stable. On examination, pt still has significant rhonchi bilaterally. Denies any abdominal pain at this time. I agree with the above's assessment and plan. Pt is eligible for LTAC placement and will likely be able to proceed with placement in AM. Blood work is reviewed, consults appreciated.   Debbora Presto [facility], pager #: [phone] Main office number: [phone]

## 2011-11-30 NOTE — Progress Notes (Signed)
Name: [name] MRN: [id number] DOB: June 26, 1933    LOS: 19  Referring Provider:  EDP Reason for Referral:  shock  PULMONARY / CRITICAL CARE MEDICINE  BRIEF PROFILE:  32 yowf with steroid dep obstructive lung disease, CHF, CAF, diverticulitis adm by PCCM via [facility] am 5/24 with increased abd pain and hypotension. CT abd c/w free air and divertular abscess. Ex-lap 5/24 PM revealed perforated sigmoid diverticulitis. Underwent sigmoid colon resection and abscess drainage (Dr Biagio Quint). Remained intubated post op  SUBJECTIVE:  6/1- wound dehiscence, back to OR, post op resp failure 6/4 wants to remain in bed for feeling bad. 6/5 wants to get up 6/5 rt picc line placed. 6/6 more awake. 6/7 adamant about not going to floor. 6/11 on floor NAD 6/12 nebs decreased. Off O2  Subjective/ overnight:  Feels better, congested cough but no sob, using flutter valve  Vital Signs: Temp:  [97.9 F (36.6 C)-100 F (37.8 C)] 97.9 F (36.6 C) (06/12 0548) Pulse Rate:  [77-90] 81  (06/12 1015) Resp:  [18-20] 18  (06/12 0548) BP: (106-132)/(61-71) 123/71 mmHg (06/12 1015) SpO2:  [95 %] 95 % (06/12 0548) FiO2 (%):  [21 %] 21 % (06/12 0837) Weight:  [166 lb 7.2 oz (75.5 kg)] 166 lb 7.2 oz (75.5 kg) (06/12 0500)    Physical Examination: HEENT: WNL Chest: coarse. +congested cough Heart: s1s2 no murmur Abd: No BS, wound vac, with open abd wound, colostomy with scant drainage, more +gas Ext: warm, no edema. Surgical absence right ring finger. Neuro: no focal deficits, follows commands  Dg Abd 1 View  11/29/2011  *RADIOLOGY REPORT*  Clinical Data: Abdominal pain and nausea.  Recent surgery for perforated diverticulitis.  ABDOMEN - 1 VIEW  Comparison: 11/11/2011  Findings: A percutaneous gastrostomy tube is seen overlying the stomach.  A small amount of contrast is seen in the rectosigmoid colon.  Both small bowel and colonic gas is seen, without evidence of significant dilatation.  Severe lumbar  spondylosis and dextroscoliosis again noted.  IMPRESSION: Nonspecific, nonobstructive bowel gas pattern.  Original Report Authenticated By: Danae Orleans, M.D.   Dg Chest Port 1 View  11/29/2011  *RADIOLOGY REPORT*  Clinical Data: Shortness of breath, cough  PORTABLE CHEST - 1 VIEW  Comparison: 11/23/2011  Findings: Dual lead left pacer noted.  Left upper lobe pulmonary nodule is stable with density compatible with granuloma.  Left breast and axillary clips again noted.  Heart size is normal.  The lungs are grossly clear otherwise.  No pleural effusion.  Right- sided PICC line tip over the mid SVC.  Right IJ line has been removed.  No pneumothorax.  IMPRESSION: No new acute abnormality.  Original Report Authenticated By: Harrel Lemon, M.D.    Lab Results  Component Value Date   WBC 17.7* 11/30/2011   HGB 8.6* 11/30/2011   HCT 26.7* 11/30/2011   MCV 90.8 11/30/2011   PLT 256 11/30/2011   Lab Results  Component Value Date   CREATININE 0.98 11/30/2011   BUN 37* 11/30/2011   NA 129* 11/30/2011   K 4.1 11/30/2011   CL 90* 11/30/2011   CO2 33* 11/30/2011      ASSESSMENT AND PLAN  PULMONARY  Acute respiratory Failure, post op 6/1>>resolved  COPD/fixed asthma, (Xolair, Singulair, Pred are chronic meds).  Previously followed by Dr. Delton Coombes.  Followed by Dr. Lucie Leather as outpt for xolair. plan Oxygen as needed to keep SpO2 > 92% >>Raynaud's phenomena makes pulse ox readings difficult Pulmonary hygiene Transitioned back  to outpt dose of prednisone (7.5 mg daily) Change BD's to nebulizer therapy and decreased 6/11 Added montelukast 6/11 F/u CXR intermittently    CARDIOVASCULAR Cardiology consult 5/24   Afib w/ RVR, chronic systolic CHF (EF 16-10%) plan Cardiology following>>defer anticoagulation, antiarrhythmic tx for now  RENAL  AKI / ATN, resolved.   Hypokalemia Plan F/u and replace as needed  GASTROINTESTINAL  Diet : full liquids 6/6 + TNA  Perforated diverticulitis s/p  sigmoid colectomy complicated by wound dehisence, and post op ileus. Plan Advance diet per [facility]CS>>continue TNA until able to have adequate oral intake Wound vac per [facility]  HEMATOLOGIC  Anemia of critical illness, and chronic disease Plan F/u CBC Transfuse for Hb < 7  INFECTIOUS  Cultures: UC 5/24 >>15K mult orgs.  BC x2 5/24 >>neg  Antibiotics: Vancomycin  5/24 only Zosyn 5/24 >>6/3  Diverticular abscess due to perforated divertic/ low grade sepsis.  Low grade temperature spike 6/11. Plan Completed course of Abx>>monitor clinically  ENDOCRINE  Hypothyroidism plan Continue  synthroid  po  NEUROLOGIC  Pain control Plan Prn tylenol, fentanyl  DISPOSITION  Continue hospital stay until abdominal process more stable>>defer timing for LTAC evaluation to [facility].  Followed by Dr. Felipa Eth as outpt for primary care.  Per Dr. Vicente Males office   Dr. Vicente Males pts are now followed by [facility].  [facility] assumed primary care duties from 6/11.   PCCM will continue to follow as pulmonary consultant prn  Call if needed.  Brett Canales Minor ACNP Adolph Pollack PCCM Pager [phone] till 3 pm If no answer page [phone] 11/30/2011, 11:00 AM  Pt independently  seen and examined and available cxr's reviewed and I agree with above findings/ imp/ plan  Sandrea Hughs, MD Pulmonary and Critical Care Medicine [facility] [phone]

## 2011-11-30 NOTE — Progress Notes (Signed)
Occupational Therapy Treatment Patient Details Name: [name] MRN: [id number] DOB: [birth year] Today's Date: 11/30/2011 Time: 4098-1191 OT Time Calculation (min): 44 min  OT Assessment / Plan / Recommendation Comments on Treatment Session      Follow Up Recommendations  LTACH    Barriers to Discharge       Equipment Recommendations  Defer to next venue    Recommendations for Other Services    Frequency Min 2X/week   Plan Other (comment) (plans select specialty/LTAC)    Precautions / Restrictions Precautions Precautions: Fall Restrictions Weight Bearing Restrictions: No   Pertinent Vitals/Pain No pain    ADL  Upper Body Bathing: Performed;Minimal assistance;Other (comment) (due to not being able to see vac/colostomy.  ) Where Assessed - Upper Body Bathing: Supported sitting Lower Body Bathing: Simulated;+2 Total assistance Lower Body Bathing: Patient Percentage: 30% Where Assessed - Lower Body Bathing:  (with AE) Toilet Transfer: Simulated;+2 Total assistance Toilet Transfer: Patient Percentage: 60% Transfers/Ambulation Related to ADLs: Pt feels weak today but agreeable to OOB.  Min guard sitting EOB ADL Comments: Wanted to bathe: decreased activity tolerance...had showed pt reacher on last visit but she felt too tired to try it today.  NT assisted with sit to stand and transfer (for safety)    OT Diagnosis:    OT Problem List:   OT Treatment Interventions:     OT Goals Acute Rehab OT Goals Time For Goal Achievement: 12/08/11 Potential to Achieve Goals: Good ADL Goals Pt Will Perform Upper Body Bathing: with supervision;Sitting, edge of bed ADL Goal: Upper Body Bathing - Progress: Goal set today Pt Will Perform Lower Body Bathing: Sit to stand from chair;with adaptive equipment;with mod assist ADL Goal: Lower Body Bathing - Progress: Goal set today Pt Will Perform Lower Body Dressing:  (pt had dehiscence--discontinued goal) Pt Will Transfer to Toilet: with min  assist;Stand pivot transfer;3-in-1 ADL Goal: Toilet Transfer - Progress: Progressing toward goals Arm Goals Pt Will Complete Theraband Exer: to increase strength;Bilateral upper extremities;2 sets;5 reps;Other (comment) Arm Goal: Theraband Exercises - Progress: Discontinued (comment) Miscellaneous OT Goals Miscellaneous OT Goal #1: Pt will verbalize 3 energy conservation techniques OT Goal: Miscellaneous Goal #1 - Progress: Progressing toward goals Miscellaneous OT Goal #2: Pt will stand with RW x 30 seconds for ADLs with min guard A OT Goal: Miscellaneous Goal #2 - Progress: Progressing toward goals Miscellaneous OT Goal #3: Pt will demonstrate use of AE from seated level with supervision OT Goal: Miscellaneous Goal #3 - Progress: Progressing toward goals  Visit Information  Last OT Received On: 11/30/11 Assistance Needed: +2    Subjective Data  Subjective: I'll do whatever you want.  I feel weak today.  I haven't had a bath yet.   Prior Functioning       Cognition  Overall Cognitive Status: Appears within functional limits for tasks assessed/performed Behavior During Session:  (fatiqued but motivated)    Mobility Bed Mobility Bed Mobility: Rolling Right Rolling Right: 4: Min assist Right Sidelying to Sit: 4: Min assist Transfers Sit to Stand: 1: +2 Total assist;From elevated surface;From bed Sit to Stand: Patient Percentage: 60%   Exercises    Balance    End of Session OT - End of Session Activity Tolerance: Patient limited by fatigue Patient left: in chair;with call bell/phone within reach;with family/visitor present   [name] 11/30/2011, 3:01 PM [name], OTR/L [phone] 11/30/2011

## 2011-12-01 DIAGNOSIS — E876 Hypokalemia: Secondary | ICD-10-CM

## 2011-12-01 DIAGNOSIS — J45909 Unspecified asthma, uncomplicated: Secondary | ICD-10-CM

## 2011-12-01 DIAGNOSIS — J438 Other emphysema: Secondary | ICD-10-CM

## 2011-12-01 DIAGNOSIS — K631 Perforation of intestine (nontraumatic): Secondary | ICD-10-CM

## 2011-12-01 LAB — MAGNESIUM: Magnesium: 2 mg/dL (ref 1.5–2.5)

## 2011-12-01 LAB — COMPREHENSIVE METABOLIC PANEL
Albumin: 2.3 g/dL — ABNORMAL LOW (ref 3.5–5.2)
Alkaline Phosphatase: 89 U/L (ref 39–117)
BUN: 40 mg/dL — ABNORMAL HIGH (ref 6–23)
Chloride: 89 mEq/L — ABNORMAL LOW (ref 96–112)
GFR calc Af Amer: 65 mL/min — ABNORMAL LOW (ref >90)
Glucose, Bld: 113 mg/dL — ABNORMAL HIGH (ref 70–99)
Potassium: 3.9 mEq/L (ref 3.5–5.1)
Total Bilirubin: 0.2 mg/dL — ABNORMAL LOW (ref 0.3–1.2)

## 2011-12-01 LAB — CBC
Hemoglobin: 8.1 g/dL — ABNORMAL LOW (ref 12.0–15.0)
MCH: 28.4 pg (ref 26.0–34.0)
MCHC: 30.9 g/dL (ref 30.0–36.0)

## 2011-12-01 LAB — URINE CULTURE: Colony Count: >=100000

## 2011-12-01 LAB — GLUCOSE, CAPILLARY: Glucose-Capillary: 107 mg/dL — ABNORMAL HIGH (ref 70–99)

## 2011-12-01 MED ORDER — MONTELUKAST SODIUM 10 MG PO TABS: 10.0000 mg | ORAL_TABLET | Freq: Every day | ORAL | Status: DC

## 2011-12-01 MED ORDER — HEPARIN SODIUM (PORCINE) 5000 UNIT/ML IJ SOLN: 5000.0000 [IU] | Freq: Three times a day (TID) | INTRAMUSCULAR | Status: DC

## 2011-12-01 MED ORDER — IPRATROPIUM BROMIDE 0.02 % IN SOLN: 0.5000 mg | RESPIRATORY_TRACT | Status: DC | PRN

## 2011-12-01 MED ORDER — ALBUTEROL SULFATE (5 MG/ML) 0.5% IN NEBU: 2.5000 mg | INHALATION_SOLUTION | RESPIRATORY_TRACT | Status: DC | PRN

## 2011-12-01 MED ORDER — DEXTROSE 5 % IV SOLN: 2.0000 g | INTRAVENOUS | Status: DC

## 2011-12-01 MED ORDER — ONDANSETRON HCL 4 MG PO TABS: 4.0000 mg | ORAL_TABLET | Freq: Three times a day (TID) | ORAL | Status: AC | PRN

## 2011-12-01 MED ORDER — LEVOTHYROXINE SODIUM 50 MCG PO TABS: 50.0000 ug | ORAL_TABLET | Freq: Every day | ORAL | Status: DC

## 2011-12-01 MED ORDER — FAMOTIDINE 20 MG PO TABS: 20.0000 mg | ORAL_TABLET | Freq: Every day | ORAL | Status: DC

## 2011-12-01 MED ORDER — ONDANSETRON HCL 4 MG/2ML IJ SOLN: 4.0000 mg | Freq: Four times a day (QID) | INTRAMUSCULAR | Status: DC | PRN

## 2011-12-01 MED ORDER — BIOTENE DRY MOUTH MT LIQD: 15.0000 mL | Freq: Four times a day (QID) | OROMUCOSAL | Status: DC

## 2011-12-01 MED ORDER — ALPRAZOLAM 0.25 MG PO TABS: 0.2500 mg | ORAL_TABLET | Freq: Three times a day (TID) | ORAL | Status: AC | PRN

## 2011-12-01 MED ORDER — DEXTROSE 5 % IV SOLN
2.0000 g | INTRAVENOUS | Status: DC
  Administered 2011-12-01: 2 g via INTRAVENOUS
  Filled 2011-12-01 (×2): qty 2

## 2011-12-01 MED ORDER — ACETAMINOPHEN 650 MG RE SUPP: 650.0000 mg | Freq: Four times a day (QID) | RECTAL | Status: AC | PRN

## 2011-12-01 MED ORDER — SULFAMETHOXAZOLE-TMP DS 800-160 MG PO TABS: 1.0000 | ORAL_TABLET | Freq: Two times a day (BID) | ORAL | Status: DC

## 2011-12-01 MED ORDER — INSULIN ASPART 100 UNIT/ML ~~LOC~~ SOLN: 0.0000 [IU] | Freq: Three times a day (TID) | SUBCUTANEOUS | Status: DC

## 2011-12-01 MED ORDER — METOPROLOL TARTRATE 25 MG PO TABS: 25.0000 mg | ORAL_TABLET | Freq: Two times a day (BID) | ORAL | Status: DC

## 2011-12-01 MED ORDER — GUAIFENESIN ER 600 MG PO TB12: 600.0000 mg | ORAL_TABLET | Freq: Two times a day (BID) | ORAL | Status: DC

## 2011-12-01 NOTE — Progress Notes (Signed)
PARENTERAL NUTRITION CONSULT NOTE  Pharmacy Consult for TNA Indication: Sigmoid Rupture/Colostomy  Allergies  Allergen Reactions  . Aspirin   . Ibuprofen   . Iron Dextran     REACTION: itching after test dose  . Metoclopramide Hcl     REACTION: chomping mouth  . Sulfonamide Derivatives    Patient Measurements: Height: 5\' 3"  (160 cm) Weight: 171 lb 8.3 oz (77.8 kg) (bed scale) IBW/kg (Calculated) : 52.4  Adjusted Body Weight: 62kg Usual Weight: 74kg  Vital Signs: Temp: 97.8 F (36.6 C) (06/13 0622) Temp src: Oral (06/13 0622) BP: 134/73 mmHg (06/13 0622) Pulse Rate: 78  (06/13 0622) Intake/Output from previous day: 06/12 0701 - 06/13 0700 In: 720 [P.O.:720] Out: 1933 [Urine:1925; Stool:10]  Labs:  [facility] 12/01/11 0334 11/30/11 0250 11/29/11 0330  WBC 15.8* 17.7* 15.3*  HGB 8.1* 8.6* 8.5*  HCT 26.2* 26.7* 27.5*  PLT 254 256 294  APTT -- -- --  INR -- -- --     Basename 12/01/11 0334 11/30/11 0250 11/29/11 0330  NA 130* 129* 134*  K 3.9 4.1 3.1*  CL 89* 90* 95*  CO2 33* 33* 31  GLUCOSE 113* 111* 102*  BUN 40* 37* 35*  CREATININE 0.97 0.98 0.97  LABCREA -- -- --  CREAT24HRUR -- -- --  CALCIUM 9.8 9.5 9.2  MG 2.0 -- --  PHOS 3.8 -- --  PROT 5.7* -- --  ALBUMIN 2.3* -- --  AST 29 -- --  ALT 32 -- --  ALKPHOS 89 -- --  BILITOT 0.2* -- --  BILIDIR -- -- --  IBILI -- -- --  PREALBUMIN -- -- --  TRIG -- -- --  CHOLHDL -- -- --  CHOL -- -- --   Estimated Creatinine Clearance: 49.5 ml/min (by C-G formula based on Cr of 0.97).    Basename 12/01/11 0626 11/30/11 2206 11/30/11 1521  GLUCAP 98 101* 116*    Medications:  Scheduled:     . albuterol  2.5 mg Nebulization TID   And  . ipratropium  0.5 mg Nebulization TID  . antiseptic oral rinse  15 mL Mouth Rinse QID  . cefTRIAXone (ROCEPHIN)  IV  2 g Intravenous Q24H  . chlorhexidine  15 mL Mouth Rinse BID  . DULoxetine  30 mg Oral BID  . famotidine  20 mg Oral QHS  . feeding supplement  237 mL  Oral BID BM  . guaiFENesin  600 mg Oral BID  . heparin subcutaneous  5,000 Units Subcutaneous Q8H  . insulin aspart  0-9 Units Subcutaneous Q8H  . levothyroxine  50 mcg Oral QAC breakfast  . metoprolol tartrate  25 mg Oral BID  . montelukast  10 mg Oral QHS  . predniSONE  7.5 mg Oral Q breakfast  . sodium chloride  10-40 mL Intracatheter Q12H  . DISCONTD: albuterol  2.5 mg Nebulization Q4H  . DISCONTD: albuterol  2.5 mg Nebulization Q4H  . DISCONTD: ipratropium  0.5 mg Nebulization Q4H  . DISCONTD: ipratropium  0.5 mg Nebulization TID    Insulin Requirements in the past 24 hours:  CBG's 73-116 Sensitive SSI - 0 units given  Current Nutrition:  Diet: FLD advanced to low-fiber diet 6/12 --> intake recorded 6/12.   Ensure Complete BID ordered Clinimix 5/15 at 70 ml/hr + Lipids MWF IVF: D5 0.45NaCl 20KCl at [facility]  Nutritional Goals:  Per RD 6/2 note: 1400-1500 Kcal and 75-85 grams protein/day.  TPN to meet > 90% estimated needs.    Clinimix  5/15 at goal rate 70 ml/hr will provide on average 1398 Kcal and 84 grams protein/day (1672 kcal/day with lipids, 1193 kcal/day without)  Assessment:  78 yo Female with perforated diverticular abscess; resection, abscess drainage 5/25. Underwent wound closure/mesh 6/2.   Has been tolerating TNA at goal rate and pre-albumin has improved (now WNL).  Per MD progress notes, patient's appetite is poor.  Decreasing TNA from goal rate may help improve appetite.  Avoid volume overload with HF.  No history of DM, on Hydrocortisone IV, and chronic steroid use PTA for COPD.  GI PPx: Protonix IV Electrolytes/Labs:  Na low - unable to adjust Na-content in Clinimix product.  K+, Mag and Phos WNL  AST/ALT WNL  Cholesterol and TG WNL  Pre-Albumin 20.6 (6/10), 10.4 (6/3), Albumin low  CBG's WNL  Plan: At 1800 today  Per PA, ok to start weaning TNA today.  Tolerating PO intake of liquids and some low-fiber diet.  Planning discharge to [facility]  6/13.  Wean Clinimix E 5/15 at half rate, 30 ml/hr, for at least 2 hours, then d/c.  Continue CBGs and sensitive slide scale insulin at q8h  Follow up diet and PO intake and plan for weaning off TNA.  TNA lab panels on Mondays & Thursdays.  F/u daily.   Lynann Beaver PharmD, BCPS Pager [phone] 12/01/2011 10:20 AM

## 2011-12-01 NOTE — Progress Notes (Signed)
Name: [name] MRN: [id number] DOB: 1933/07/17    LOS: 20  Referring Provider:  EDP Reason for Referral:  shock  PULMONARY / CRITICAL CARE MEDICINE  BRIEF PROFILE:  46 yowf with steroid dep obstructive lung disease, CHF, CAF, diverticulitis adm by PCCM via [facility] am 5/24 with increased abd pain and hypotension. CT abd c/w free air and divertular abscess. Ex-lap 5/24 PM revealed perforated sigmoid diverticulitis. Underwent sigmoid colon resection and abscess drainage (Dr Biagio Quint). Remained intubated post op  SUBJECTIVE:  6/1- wound dehiscence, back to OR, post op resp failure 6/4 wants to remain in bed for feeling bad. 6/5 wants to get up 6/5 rt picc line placed. 6/6 more awake. 6/7 adamant about not going to floor. 6/11 on floor NAD 6/12 nebs decreased. Off O2 6/13 plan for dc to LTAC  Subjective/ overnight:  Feels better.  Vital Signs: Temp:  [97.8 F (36.6 C)-98.5 F (36.9 C)] 97.8 F (36.6 C) (06/13 0622) Pulse Rate:  [78-90] 84  (06/13 1013) Resp:  [18-20] 19  (06/13 0622) BP: (104-134)/(59-73) 120/70 mmHg (06/13 1013) SpO2:  [93 %-100 %] 99 % (06/13 0945) FiO2 (%):  [21 %] 21 % (06/12 1533) Weight:  [171 lb 8.3 oz (77.8 kg)] 171 lb 8.3 oz (77.8 kg) (06/13 0500)    Physical Examination: HEENT: WNL Chest: coarse. +congested cough Heart: s1s2 no murmur Abd: No BS, wound vac, with open abd wound, colostomy with scant drainage, more +gas Ext: warm, no edema. Surgical absence right ring finger. Neuro: no focal deficits, follows commands   Lab 12/01/11 0334 11/30/11 0250 11/29/11 0330  NA 130* 129* 134*  K 3.9 4.1 3.1*  CL 89* 90* 95*  CO2 33* 33* 31  BUN 40* 37* 35*  CREATININE 0.97 0.98 0.97  GLUCOSE 113* 111* 102*    Lab 12/01/11 0334 11/30/11 0250 11/29/11 0330  HGB 8.1* 8.6* 8.5*  HCT 26.2* 26.7* 27.5*  WBC 15.8* 17.7* 15.3*  PLT 254 256 294      ASSESSMENT AND PLAN  PULMONARY  Acute respiratory Failure, post op 6/1>>resolved  COPD/fixed  asthma, (Xolair, Singulair, Pred are chronic meds).  Previously followed by Dr. Delton Coombes.  Followed by Dr. Lucie Leather as outpt for xolair. plan Oxygen as needed to keep SpO2 > 92% >>Raynaud's phenomena makes pulse ox readings difficult Pulmonary hygiene Transitioned back to outpt dose of prednisone (7.5 mg daily) Change BD's to nebulizer therapy and decreased 6/11 Added montelukast 6/11 F/u CXR intermittently    CARDIOVASCULAR Cardiology consult 5/24   Afib w/ RVR, chronic systolic CHF (EF 40-98%) plan Cardiology following>>defer anticoagulation, antiarrhythmic tx for now  RENAL  AKI / ATN, resolved.   Hypokalemia Plan F/u and replace as needed  GASTROINTESTINAL  Diet : full liquids 6/6 + TNA  Perforated diverticulitis s/p sigmoid colectomy complicated by wound dehisence, and post op ileus. Plan Advance diet per [facility]>>continue TNA until able to have adequate oral intake Wound vac per [facility]  HEMATOLOGIC  Anemia of critical illness, and chronic disease Plan F/u CBC Transfuse for Hb < 7  INFECTIOUS  Cultures: UC 5/24 >>15K mult orgs.  BC x2 5/24 >>neg  Antibiotics: Vancomycin  5/24 only Zosyn 5/24 >>6/3  Diverticular abscess due to perforated divertic/ low grade sepsis.  Low grade temperature spike 6/11. Plan Completed course of Abx>>monitor clinically  ENDOCRINE  Hypothyroidism plan Continue  synthroid  po  NEUROLOGIC  Pain control Plan Prn tylenol, fentanyl  DISPOSITION  Continue hospital stay until abdominal process more stable>>defer timing  for LTAC evaluation to [facility].  Followed by Dr. Felipa Eth as outpt for primary care.  Per Dr. Vicente Males office   Dr. Vicente Males pts are now followed by [facility].  [facility] assumed primary care duties from 6/11.   PCCM will continue to follow as pulmonary consultant prn  Call if needed.  Brett Canales Minor ACNP [facility] Pollack PCCM Pager [phone] till 3 pm If no answer page [phone] 12/01/2011, 10:44 AM  Pt independently  seen and examined and  available cxr's reviewed and I agree with above findings/ imp/ plan  Sandrea Hughs, MD Pulmonary and Critical Care Medicine [facility] Cell [phone]

## 2011-12-01 NOTE — Discharge Summary (Signed)
Physician Discharge Summary  Patient ID: [name] MRN: [id number] DOB/AGE: 76/05/1934 76 y.o.  Admit date: 11/11/2011 Discharge date: 12/01/2011  Primary Care Physician:  [name], MD   Discharge Diagnoses:    Principal Problem:  *Perforated diverticulitis Active Problems:  HYPOTHYROIDISM  Atrial fibrillation  HEART FAILURE  History of hypertension  Respiratory failure following trauma and surgery  Chronic obstructive asthma  Peritonitis  Hypotension  Acute respiratory failure   Medication List  As of 12/01/2011  1:56 PM   STOP taking these medications         benzonatate 200 MG capsule      cyclobenzaprine 10 MG tablet      docusate sodium 100 MG capsule      EPIPEN 0.3 mg/0.3 mL Devi      eszopiclone 3 MG Tabs      lansoprazole 30 MG capsule      Loperamide HCl 2 MG Chew      meloxicam 15 MG tablet      metoprolol succinate 50 MG 24 hr tablet      nystatin 100000 UNIT/ML suspension      omalizumab 150 MG injection      ondansetron 4 MG disintegrating tablet      SYNTHROID 75 MCG tablet      warfarin 3 MG tablet      zafirlukast 20 MG tablet         TAKE these medications         acetaminophen 650 MG suppository   Commonly known as: TYLENOL   Place 1 suppository (650 mg total) rectally every 6 (six) hours as needed for fever.      PROVENTIL HFA 108 (90 BASE) MCG/ACT inhaler   Generic drug: albuterol   Inhale 2 puffs into the lungs every 6 (six) hours as needed. For shortness of breath.      albuterol (5 MG/ML) 0.5% nebulizer solution   Commonly known as: PROVENTIL   Take 0.5 mLs (2.5 mg total) by nebulization every 2 (two) hours as needed for wheezing or shortness of breath.      ALPRAZolam 0.25 MG tablet   Commonly known as: XANAX   Take 1 tablet (0.25 mg total) by mouth 3 (three) times daily as needed for anxiety.      antiseptic oral rinse Liqd   15 mLs by Mouth Rinse route QID.      azelastine 137 MCG/SPRAY nasal spray   Commonly known as: ASTELIN   Place 2 sprays into the nose at bedtime. Use in each nostril as directed      beclomethasone 80 MCG/ACT inhaler   Commonly known as: QVAR   Inhale 2 puffs into the lungs 2 (two) times daily.      candesartan 16 MG tablet   Commonly known as: ATACAND   Take 16 mg by mouth daily.      dextrose 5 % SOLN 50 mL with cefTRIAXone 2 G SOLR 2 g   Inject 2 g into the vein daily.      donepezil 10 MG tablet   Commonly known as: ARICEPT   Take 1 tablet (10 mg total) by mouth at bedtime.      DULoxetine 30 MG capsule   Commonly known as: CYMBALTA   Take 30 mg by mouth 2 (two) times daily.      famotidine 20 MG tablet   Commonly known as: PEPCID   Take 1 tablet (20 mg total) by mouth at bedtime.  furosemide 20 MG tablet   Commonly known as: LASIX   Take 20 mg by mouth daily as needed. For fluid retention.      guaiFENesin 600 MG 12 hr tablet   Commonly known as: MUCINEX   Take 1 tablet (600 mg total) by mouth 2 (two) times daily.      heparin 5000 UNIT/ML injection   Inject 1 mL (5,000 Units total) into the skin every 8 (eight) hours.      HYDROcodone-acetaminophen 10-325 MG per tablet   Commonly known as: NORCO   Take 1 tablet by mouth every 6 (six) hours as needed. For pain.      insulin aspart 100 UNIT/ML injection   Commonly known as: novoLOG   Inject 0-9 Units into the skin every 8 (eight) hours.      ipratropium 0.02 % nebulizer solution   Commonly known as: ATROVENT   Take 2.5 mLs (0.5 mg total) by nebulization every 2 (two) hours as needed.      levothyroxine 50 MCG tablet   Commonly known as: SYNTHROID, LEVOTHROID   Take 1 tablet (50 mcg total) by mouth daily before breakfast.      metoprolol tartrate 25 MG tablet   Commonly known as: LOPRESSOR   Take 1 tablet (25 mg total) by mouth 2 (two) times daily.      montelukast 10 MG tablet   Commonly known as: SINGULAIR   Take 1 tablet (10 mg total) by mouth at bedtime.       ondansetron 4 MG/2ML Soln injection   Commonly known as: ZOFRAN   Inject 2 mLs (4 mg total) into the vein every 6 (six) hours as needed for nausea or vomiting.      ondansetron 4 MG tablet   Commonly known as: ZOFRAN   Take 1 tablet (4 mg total) by mouth every 8 (eight) hours as needed.      predniSONE 5 MG tablet   Commonly known as: DELTASONE   Take 7.5 mg by mouth daily.      ranitidine 150 MG tablet   Commonly known as: ZANTAC   Take 300 mg by mouth daily.      RHINOCORT AQUA 32 MCG/ACT nasal spray   Generic drug: budesonide   Place 1 spray into the nose daily.      simvastatin 10 MG tablet   Commonly known as: ZOCOR   Take 10 mg by mouth at bedtime.      traZODone 100 MG tablet   Commonly known as: DESYREL   Take 100 mg by mouth at bedtime. For sleep.      Vitamin D3 50000 UNITS Caps   Take 1 tablet by mouth every 7 (seven) days. Taken on Sundays.           Disposition and Follow-up: Pt is medically stable and ready for discharge to LTAC  Consults:  Surgery, Critical Care, Wound consult, cardiology  Physical Exam:  General: Alert, awake, oriented x3, in no acute distress.  HEENT: No bruits, no goiter. PERRL mucus membranes mouth moist/pink.  Heart: Regular rate and rhythm, without murmurs, rubs, gallops.  Lungs:Normal effort. Breath sounds with rhonchi. No wheeze.  Abdomen: Soft, Mild tenderness, wound vac with open wound intact, colostomy bag sm amount brown liquid stool. positive bowel sounds.  Extremities: No clubbing cyanosis or edema with positive pedal pulses.  Neuro: Grossly intact, nonfocal. Speech clear.   Significant Diagnostic Studies:   BMET    Component Value Date/Time   NA  130* 12/01/2011 0334   K 3.9 12/01/2011 0334   CL 89* 12/01/2011 0334   CO2 33* 12/01/2011 0334   GLUCOSE 113* 12/01/2011 0334   BUN 40* 12/01/2011 0334   CREATININE 0.97 12/01/2011 0334   CALCIUM 9.8 12/01/2011 0334   GFRNONAA 56* 12/01/2011 0334   GFRAA 65* 12/01/2011 0334    CBC    Component Value Date/Time   WBC 15.8* 12/01/2011 0334   WBC 16.7* 04/01/2011 1502   RBC 2.85* 12/01/2011 0334   RBC 4.06 04/01/2011 1502   HGB 8.1* 12/01/2011 0334   HGB 11.2* 04/01/2011 1502   HCT 26.2* 12/01/2011 0334   HCT 35.7 04/01/2011 1502   PLT 254 12/01/2011 0334   PLT 229 04/01/2011 1502   MCV 91.9 12/01/2011 0334   MCV 87.9 04/01/2011 1502   MCH 28.4 12/01/2011 0334   MCH 27.6 04/01/2011 1502   MCHC 30.9 12/01/2011 0334   MCHC 31.4* 04/01/2011 1502   RDW 15.1 12/01/2011 0334   RDW 22.5* 04/01/2011 1502   LYMPHSABS 0.7 11/28/2011 0326   LYMPHSABS 0.9 04/01/2011 1502   MONOABS 0.8 11/28/2011 0326   MONOABS 1.1* 04/01/2011 1502   EOSABS 0.0 11/28/2011 0326   EOSABS 0.1 04/01/2011 1502   BASOSABS 0.0 11/28/2011 0326   BASOSABS 0.0 04/01/2011 1502    Procedure(s):  EXPLORATORY LAPAROTOMY GASTROSTOMY INSERTION OF MESH APPLICATION OF WOUND VAC   Dg Chest 1 View 11/15/2011   IMPRESSION:   1.  New focal airspace opacity in the left upper lung field, peripherally.  This is new compared to 11/12/2011.  Cannot exclude pneumonia or pulmonary infarction. 2.  Low lung volumes with bibasilar atelectasis and small left pleural effusion. 3.  Subacute versus remote left sixth rib fracture and remote right rib fractures.   Dg Abd 1 View 11/29/2011   IMPRESSION:  Nonspecific, nonobstructive bowel gas pattern.   Ct Abdomen Pelvis W Contrast 11/11/2011   IMPRESSION:  The study is positive for free intraperitoneal air.  Large amount of inflammation in the pelvis with extraluminal air-fluid collections.  The inflammation appears to be associated with the sigmoid colon and related to acute sigmoid diverticulitis.  There are complex abscess collections in the anterior pelvis and suspect early abscess formation in the posterior pelvis.  Multiple low-density structures in the liver are suggestive for cysts and possibly hemangiomas.  Stable hyperdense left renal cyst.  Suspect that there  is a small right angiomyolipoma.    Dg Chest Port 1 View 11/29/2011   IMPRESSION:  No new acute abnormality.   Dg Chest Port 1 View 11/23/2011  *RADIOLOGY REPORT*  Clinical Data: Line placement.  PORTABLE CHEST - 1 VIEW  Comparison: 11/23/2011 and 11/22/2011.  Findings: 1503 hours. Interval right arm PICC placement.  The tip of the line overlaps the spine and is not well seen, but appears to be in the lower SVC.  The preexisting right IJ central venous catheter and left subclavian pacemaker leads are unchanged.  Heart size and mediastinal contours are stable.  There is stable patchy airspace disease at the left lung base and a stable calcified left upper lobe granuloma.  IMPRESSION: PICC placement as described without demonstrated complication.  Original Report Authenticated By: Gerrianne Scale, M.D.   Dg Chest Port 1 View 11/23/2011  *RADIOLOGY REPORT*  Clinical Data: Asthma.  PORTABLE CHEST - 1 VIEW  Comparison: 11/22/2011.  Findings: The right IJ catheter and pacer wires are stable.  The lungs show improved aeration with better lung volumes and  resolving areas of atelectasis.  Stable left upper lobe calcified granuloma.  IMPRESSION: Improved aeration with better lung volumes and resolving atelectasis.  Original Report Authenticated By: P. Loralie Champagne, M.D.   Dg Chest Port 1 View 11/22/2011  *RADIOLOGY REPORT*  Clinical Data: Respiratory distress.  PORTABLE CHEST - 1 VIEW  Comparison: 11/21/2011.  Findings: The pacer wires and right IJ catheter are stable.  The heart is normal in size.  Prominent mediastinal and hilar contours are stable.  Slightly lower lung volumes with increase and vascular congestion and atelectasis.  No edema or pneumothorax.  IMPRESSION: Slightly lower lung volumes but otherwise stable chest x-ray.  Original Report Authenticated By: P. Loralie Champagne, M.D.   Dg [facility] 1 View 11/21/2011  *RADIOLOGY REPORT*  Clinical Data: Evaluate pulmonary edema.  COPD.  PORTABLE CHEST - 1  VIEW  Comparison: 11/19/2011  Findings: Right IJ central line unchanged. Pacer with leads at right atrium and right ventricle.  No lead discontinuity.  Numerous leads and wires project over the chest.  Normal heart size.  No pleural effusion or pneumothorax.  Mild subsegmental atelectasis at the lung bases.  This is improved. Improved inspiratory effort.  Resolved pulmonary venous congestion. The chin overlies left apex. Interval extubation.  IMPRESSION: Improved aeration with decreased bibasilar subsegmental atelectasis and resolved pulmonary venous congestion.  Extubation.  Original Report Authenticated By: Consuello Bossier, M.D.   Dg Chest Port 1 View 11/19/2011  *RADIOLOGY REPORT*  Clinical Data: Status post intubation  PORTABLE CHEST - 1 VIEW  Comparison: 11/18/2011  Findings: Left chest wall pacer device is noted with lead in the right atrial appendage and right ventricle.  ET tube tip is above the carina.  There is a right IJ catheter with tip in the SVC.  Heart size is normal.  There are low lung volumes. Bibasilar opacities are unchanged from previous exam.  IMPRESSION:  1.  Satisfactory position of the endotracheal tube with tip just above the carina.  Original Report Authenticated By: Rosealee Albee, M.D.   Dg Chest Port 1 View 11/18/2011  *RADIOLOGY REPORT*  Clinical Data: Assess pulmonary edema.  PORTABLE CHEST - 1 VIEW  Comparison: Chest x-ray of 11/16/2011.  Findings: There is a a right-sided internal jugular central venous catheter with tip terminating in the distal superior vena cava. There is a left-sided pacemaker device in place with lead tips projecting over the expected location of the right atrium and right ventricle.  Compared to the recent prior examinations, the focal airspace opacity in the lateral aspect of the left upper lobe is no longer is clearly visualized.  There continue to be some bibasilar patchy linear areas and regions of interstitial prominence (left greater than right)  which may reflect areas of atelectasis and/or sequelae of aspiration.  No definite pleural effusions.  No evidence of pulmonary edema.  Heart size is borderline enlarged. The patient is rotated to the left on today's exam, resulting in distortion of the mediastinal contours and reduced diagnostic sensitivity and specificity for mediastinal pathology. Atherosclerotic calcifications within the arch of the aorta. Surgical clips project over the left axillary region. A large calcified granuloma is again noted in the periphery of the left apex.  IMPRESSION: 1.  Support apparatus, as above. 2.  Low lung volumes with bibasilar linear opacities and interstitial prominence which may reflect a combination of atelectasis and/or aspiration pneumonitis. 3.  No evidence of pulmonary edema at this time. 4.  Atherosclerosis.  Original Report Authenticated By: Haze Boyden.  Llana Aliment, M.D.   Dg Chest Port 1 View 11/16/2011  *RADIOLOGY REPORT*  Clinical Data: Evaluate endotracheal tube, pulmonary edema  PORTABLE CHEST - 1 VIEW  Comparison: 11/15/2011; 11/12/2011; 01/12/2011  Findings: Grossly unchanged cardiac silhouette and mediastinal contours.  Stable position of support apparatus.  No definite pneumothorax, though evaluation is somewhat obscured secondary to overlying chin.  Lung volumes remain persistently reduced.  There is mild elevation of the right hemidiaphragm.  Blunting of the bilateral costophrenic angles may suggest small bilateral effusions.  Grossly unchanged peripheral heterogeneous air space opacity within the left upper lung.  Grossly unchanged bones.  Left breast surgical clips.  IMPRESSION: 1.  Stable positioning of support apparatus.  No pneumothorax. 2.  Grossly unchanged peripheral heterogeneous air space opacity within the left upper lung worrisome for infection. Continued attention on follow-up is recommended. 3.  Persistently reduced lung volumes with small bilateral effusions and basilar opacities.  Original  Report Authenticated By: Waynard Reeds, M.D.   Dg Chest Port 1 View 11/12/2011  *RADIOLOGY REPORT*  Clinical Data: Tube placement.  Line placement.  PORTABLE CHEST - 1 VIEW  Comparison: 01/12/2011  Findings: The patient's right IJ line, tip superior vena cava. Endotracheal tube is in place, tip approximately 3 cm above carina. Nasogastric tube is in place, tip off the film but beyond the esophagogastric junction.  A left-sided pacemaker has leads overlying the right atrium and right ventricle.  Film is very shallow inflation, accentuating cardiac size which is upper limits normal.  There is minimal perihilar atelectasis but no overt edema.  No focal consolidations.  There are old rib fractures.  Degenerative changes are seen in the thoracic spine. Surgical clips overlie the left axillary region.  IMPRESSION:  1.  Cardiomegaly without overt edema. 2.  Bibasilar atelectasis. 3.  Lines and tubes as described.  Original Report Authenticated By: Patterson Hammersmith, M.D.   Dg Abd Acute W/chest 11/11/2011  *RADIOLOGY REPORT*  Clinical Data: Left lower quadrant pain.  Nausea and vomiting.  ACUTE ABDOMEN SERIES (ABDOMEN 2 VIEW & CHEST 1 VIEW)  Comparison: Chest radiograph on 01/12/2011  Findings: Several nondilated small bowel loops are seen which contain air fluid levels in the pelvis.  No dilated bowel loops identified.  No evidence of free air.  Low lung volumes are again seen, however both lungs are clear. Heart size is within normal limits.  Dual lead transvenous pacemaker remains in appropriate position.  Surgical clips again noted in the left chest wall.  IMPRESSION:  1.  Nonspecific, nonobstructive bowel gas pattern. 2.  No active cardiopulmonary disease.  Original Report Authenticated By: Danae Orleans, M.D.     Brief H and P: For complete details please refer to admission H and P, but in brief   77 yowf with apparent steroid dep copd/ab s/p remote smoking cessation with baseline doe x across a room with  h/o chf/ caf adn diverticulitis to [facility] am 5/24 with 24 h increased abd pain and low bp responding to fluids but ct abd c/w free air and divertular abscess > to OR pm 5/24 planned and ccm asked to admit. Ex-lap 5/24 PM revealed perforated sigmoid diverticulitis. Underwent sigmoid colon resection and abscess drainage (Dr Biagio Quint). Remained intubated post op   Hospital Course:   Principal Problem:  *Perforated diverticulitis Active Problems:  HYPOTHYROIDISM  Atrial fibrillation  HEART FAILURE  History of hypertension  Respiratory failure following trauma and surgery  Chronic obstructive asthma  Peritonitis  Hypotension  Acute respiratory  failure  Acute respiratory Failure, post op 6/1>>resolved   Chronic respiratory failure secondary to COPD/fixed asthma (Xolair, Singulair, Pred are chronic meds). Does have coarse BS, chest xray 6/11 stable. Continue oxygen as needed to keep SpO2 > 92% >>Raynaud's phenomena makes pulse ox readings difficult . Continue home dose of prednisone (7.5 mg daily) . nebulizer therapy . Continue montelukast . At discharge sats >90% on 2L  Afib w/ RVR, chronic systolic CHF (EF 40-98%)  Seen by Cardiology to assist with management. Will continue to defer anticoagulation, antiarrhythmic tx for now .  Will continue lasix at home dose, BB at discharge. Volume status -7.3L at discharge. Wt at discharge 77.8 from 78.2. Rhythm remained controlled with occ PAC.  AKI / ATN, resolved.   Hypokalemia  Likely related to gi losses. Repleted and resoved.  Mag level WNL   Hyponatremia: . Improving at 130 today. Held lasix 11/30/11.  Likely related to lasix.  Urine osmolality 294, urine sodium 66. Would monitor at [facility]  Perforated diverticulitis s/p sigmoid colectomy complicated by wound dehisence, and post op ileus.  Patient was admitted  due to perforated viscus do to diverticulitis, diverticular abscess, she was operated by general surgery and was admitted under pulmonary  critical care service, patient has significant past medical history of asthma and is steroid dependent on 7.5 mg of prednisone daily for the last 10-12 years, patient's during the initial surgery patient received colostomy, postop course was complicated by dehiscence of abdominal wound, this required repeat surgery with wound VAC placement, PEG tube placement, she has been on TPN for the last several days, until 12/01/11. At discharge pt with colostomy, wound vac and tolerating low residue diet.   Continue wound vac per CCS. Plan to change next wound vac on Friday 12/02/11. Supplies at bedside for nurse to used.  Suction at 100 mmHG without air leaks.   Anemia of critical illness, and chronic disease  Hg stable in range 8-9. Will monitor closely. Transfuse for Hb < 7   Diverticular abscess due to perforated divertic/ low grade sepsis  Urine culture 5/24 w/15K mult organism. Blood culture x2 5/24 neg. Pt received Vancomycin 5/24 only then Zosyn 5/24 >>6/3 . Completed course of Abx>>monitor clinically   Leukocytosis: afebrile, non-toxic appearing, chest xray yesterday without acute changes urine with likely UTI. Pt refusing to have foley removed. Urine culture with staph aureus. Will continue rocephin day #3 until sensitivities back.   Hypothyroidism  Continue synthroid.   Pain control  Prn tylenol, fentanyl   Time spent on Discharge: 45 minutes  Signed: Gwenyth Bender 12/01/2011, 1:56 PM  I have seen and examined pt at bedside. Pt is hemodynamically stable. I have discussed the plan with pt and her husband, > 30 minutes.  [name]  [facility] [phone]

## 2011-12-01 NOTE — Progress Notes (Signed)
Patient ID: [name], female    DOB: 15-May-1934, 78 y.o.   MRN: [id number] 12 Days Post-Op  Subjective: Pt currently without complaints, denies abd pain, n/v, tolerating diet but unable to eat much.  Reports dietary is weaning her TNA.  Objective: Vital signs in last 24 hours: Temp:  [97.8 F (36.6 C)-98.5 F (36.9 C)] 97.8 F (36.6 C) (06/13 0622) Pulse Rate:  [78-90] 84  (06/13 1013) Resp:  [18-20] 19  (06/13 0622) BP: (104-134)/(59-73) 120/70 mmHg (06/13 1013) SpO2:  [93 %-100 %] 99 % (06/13 0945) FiO2 (%):  [21 %] 21 % (06/12 1533) Weight:  [171 lb 8.3 oz (77.8 kg)] 171 lb 8.3 oz (77.8 kg) (06/13 0500) Last BM Date: 11/30/11  Intake/Output from previous day: 06/12 0701 - 06/13 0700 In: 720 [P.O.:720] Out: 1935 [Urine:1925; Stool:10] Intake/Output this shift: Total I/O In: 240 [P.O.:240] Out: 475 [Urine:475]  PE: General appearance: alert, cooperative and no distress GI: wound vac in place, abd nontender, bs present, colostomy is pink with output.  Lab Results:   Basename 12/01/11 0334 11/30/11 0250  WBC 15.8* 17.7*  HGB 8.1* 8.6*  HCT 26.2* 26.7*  PLT 254 256   BMET  Basename 12/01/11 0334 11/30/11 0250  NA 130* 129*  K 3.9 4.1  CL 89* 90*  CO2 33* 33*  GLUCOSE 113* 111*  BUN 40* 37*  CREATININE 0.97 0.98  CALCIUM 9.8 9.5   PT/INR No results found for this basename: LABPROT:2,INR:2 in the last 72 hours CMP     Component Value Date/Time   NA 130* 12/01/2011 0334   K 3.9 12/01/2011 0334   CL 89* 12/01/2011 0334   CO2 33* 12/01/2011 0334   GLUCOSE 113* 12/01/2011 0334   BUN 40* 12/01/2011 0334   CREATININE 0.97 12/01/2011 0334   CALCIUM 9.8 12/01/2011 0334   PROT 5.7* 12/01/2011 0334   ALBUMIN 2.3* 12/01/2011 0334   AST 29 12/01/2011 0334   ALT 32 12/01/2011 0334   ALKPHOS 89 12/01/2011 0334   BILITOT 0.2* 12/01/2011 0334   GFRNONAA 56* 12/01/2011 0334   GFRAA 65* 12/01/2011 0334   Lipase  No results found for this basename: lipase        Studies/Results: No results found.  Anti-infectives: Anti-infectives     Start     Dose/Rate Route Frequency Ordered Stop   11/30/11 1000   cefTRIAXone (ROCEPHIN) 2 g in dextrose 5 % 50 mL IVPB        2 g 100 mL/hr over 30 Minutes Intravenous Every 24 hours 11/30/11 0817     11/26/11 1130   fluconazole (DIFLUCAN) tablet 200 mg        200 mg Oral  Once 11/26/11 1129 11/26/11 1200   11/11/11 2000   piperacillin-tazobactam (ZOSYN) IVPB 3.375 g  Status:  Discontinued        3.375 g 12.5 mL/hr over 240 Minutes Intravenous Every 8 hours 11/11/11 1928 11/21/11 1017   11/11/11 1130  piperacillin-tazobactam (ZOSYN) IVPB 3.375 g       3.375 g 100 mL/hr over 30 Minutes Intravenous  Once 11/11/11 1050 11/11/11 1200   11/11/11 1130   vancomycin (VANCOCIN) IVPB 1000 mg/200 mL premix        1,000 mg 200 mL/hr over 60 Minutes Intravenous  Once 11/11/11 1050 11/11/11 1341           Assessment/Plan EXPLORATORY LAPAROTOMY, COLON RESECTION SIGMOID INCISION AND DRAINAGE ABSCESS for perforated diverticulitis - 11/12/2011 - Dr. Trude Mcburney  Wound  dehiscence - Closed with Strattice mesh and VAC - D. Newman - 11/19/2011  Atrial fibrillation - seen by Dr. Burna Forts  On amiodarone, and chronic anticoagulation History of HEART FAILURE EF 30-35%  Chronic obstructive asthma, steroid dependant CCM/Medicine following  Raynaud syndrome HYPOTHYROIDISM  On isolation for MRSA\  On TNA For malnutrition  [facility]is interested in LTAC, Medicine awaiting our OK to go to [facility].   Plan:  Slow progress, advancing diet and dietary is weaning TNA, OK from surgical standpoint to go to [facility].  LOS: 20 days    [name] 12/01/2011      Please disregard the comments below: Fever    Review of Systems  Constitutional: Positive for fever.    Physical Exam

## 2011-12-01 NOTE — Progress Notes (Signed)
General Surgery [facility] - Attending  Stable at present.  Awaiting transfer to [facility] ([facility]  Velora Heckler, MD, [facility] Office: [phone]

## 2011-12-02 ENCOUNTER — Inpatient Hospital Stay
Admission: AD | Admit: 2011-12-02 | Discharge: 2011-12-21 | Disposition: A | Discharge status: Skilled Nursing Facility | Payer: Medicare Other | Source: Ambulatory Visit | Attending: Internal Medicine | Admitting: Internal Medicine

## 2011-12-02 DIAGNOSIS — J45909 Unspecified asthma, uncomplicated: Secondary | ICD-10-CM

## 2011-12-02 DIAGNOSIS — E876 Hypokalemia: Secondary | ICD-10-CM

## 2011-12-02 DIAGNOSIS — J438 Other emphysema: Secondary | ICD-10-CM

## 2011-12-02 DIAGNOSIS — K631 Perforation of intestine (nontraumatic): Secondary | ICD-10-CM

## 2011-12-02 LAB — CBC
HCT: 26 % — ABNORMAL LOW (ref 36.0–46.0)
MCH: 28.3 pg (ref 26.0–34.0)
MCHC: 30.8 g/dL (ref 30.0–36.0)
MCV: 91.9 fL (ref 78.0–100.0)
Platelets: 222 10*3/uL (ref 150–400)
RDW: 15.5 % (ref 11.5–15.5)
WBC: 13 10*3/uL — ABNORMAL HIGH (ref 4.0–10.5)

## 2011-12-02 LAB — BASIC METABOLIC PANEL
BUN: 34 mg/dL — ABNORMAL HIGH (ref 6–23)
Calcium: 9.9 mg/dL (ref 8.4–10.5)
Chloride: 91 mEq/L — ABNORMAL LOW (ref 96–112)
Creatinine, Ser: 0.98 mg/dL (ref 0.50–1.10)
GFR calc Af Amer: 64 mL/min — ABNORMAL LOW (ref >90)

## 2011-12-02 MED ORDER — DOXYCYCLINE HYCLATE 100 MG PO TABS
100.0000 mg | ORAL_TABLET | Freq: Two times a day (BID) | ORAL | Status: DC
  Administered 2011-12-02: 100 mg via ORAL
  Filled 2011-12-02 (×2): qty 1

## 2011-12-02 NOTE — Progress Notes (Signed)
I have seen and examined the patient at bedside. Please see dictated discharge summary from 12/01/2011 Debbora Presto [facility] [phone]

## 2011-12-02 NOTE — Progress Notes (Signed)
13 Days Post-Op  Subjective: She feels very sleepy today, not any better or worse.  He husband is at [facility] with his LV[facility] today.  Objective: Vital signs in last 24 hours: Temp:  [97.7 F (36.5 C)-99.1 F (37.3 C)] 98.5 F (36.9 C) (06/14 0909) Pulse Rate:  [81-89] 86  (06/14 0909) Resp:  [18-19] 18  (06/14 0518) BP: (106-117)/(66-73) 116/66 mmHg (06/14 0909) SpO2:  [94 %-97 %] 94 % (06/14 0518) Weight:  [77.6 kg (171 lb 1.2 oz)] 77.6 kg (171 lb 1.2 oz) (06/14 0518) Last BM Date: 11/30/11  Afebrile, VSS, labs stable  Intake/Output from previous day: 06/13 0701 - 06/14 0700 In: 1530 [P.O.:600; IV Piggyback:50; TPN:880] Out: 1575 [Urine:1575] Intake/Output this shift: Total I/O In: 120 [P.O.:120] Out: -   General appearance: alert, cooperative and no distress GI: soft, tender, +BS, wound dressing for change later today.  Ostomy working.  Lab Results:   Basename 12/02/11 0410 12/01/11 0334  WBC 13.0* 15.8*  HGB 8.0* 8.1*  HCT 26.0* 26.2*  PLT 222 254    BMET  Basename 12/02/11 0410 12/01/11 0334  NA 130* 130*  K 4.3 3.9  CL 91* 89*  CO2 33* 33*  GLUCOSE 76 113*  BUN 34* 40*  CREATININE 0.98 0.97  CALCIUM 9.9 9.8   PT/INR No results found for this basename: LABPROT:2,INR:2 in the last 72 hours   Lab 12/01/11 0334 11/28/11 0326  AST 29 26  ALT 32 30  ALKPHOS 89 92  BILITOT 0.2* 0.1*  PROT 5.7* 5.4*  ALBUMIN 2.3* 2.0*     Lipase  No results found for this basename: lipase     Studies/Results: No results found.  Medications:    . albuterol  2.5 mg Nebulization TID   And  . ipratropium  0.5 mg Nebulization TID  . antiseptic oral rinse  15 mL Mouth Rinse QID  . chlorhexidine  15 mL Mouth Rinse BID  . doxycycline  100 mg Oral Q12H  . DULoxetine  30 mg Oral BID  . famotidine  20 mg Oral QHS  . feeding supplement  237 mL Oral BID BM  . guaiFENesin  600 mg Oral BID  . heparin subcutaneous  5,000 Units Subcutaneous Q8H  . insulin aspart  0-9 Units  Subcutaneous Q8H  . levothyroxine  50 mcg Oral QAC breakfast  . metoprolol tartrate  25 mg Oral BID  . montelukast  10 mg Oral QHS  . predniSONE  7.5 mg Oral Q breakfast  . sodium chloride  10-40 mL Intracatheter Q12H  . DISCONTD: cefTRIAXone (ROCEPHIN)  IV  2 g Intravenous Q24H  . DISCONTD: cefTRIAXone (ROCEPHIN)  IV  2 g Intravenous Q24H  . DISCONTD: sulfamethoxazole-trimethoprim  1 tablet Oral Q12H    Assessment/Plan EXPLORATORY LAPAROTOMY, COLON RESECTION SIGMOID INCISION AND DRAINAGE ABSCESS for perforated diverticulitis - 11/12/2011 - Dr. Trude Mcburney  Wound dehiscence - Closed with Strattice mesh and VAC - D. Newman - 11/19/2011  Atrial fibrillation - seen by Dr. Burna Forts  On amiodarone, and chronic anticoagulation History of HEART FAILURE EF 30-35%  Chronic obstructive asthma, steroid dependant CCM/Medicine following  Raynaud syndrome  HYPOTHYROIDISM  On isolation for MRSA\  On TNA For malnutrition   OK to go to LTAC.   Plan:  OK for transfer to LTAC, the retention sutures can be removed with next dressing change.  LOS: 21 days    Jamarrius Salay 12/02/2011

## 2011-12-02 NOTE — Discharge Summary (Signed)
For full details of discharge see discharge summary dated 12/01/11.  Pt is medically stable and ready for discharge to LTAC.  Physical exam General: Alert, awake, oriented x3, in no acute distress.  HEENT: No bruits, no goiter. PERRL mucus membranes mouth moist/pink.  Heart: Regular rate and rhythm, without murmurs, rubs, gallops.  Lungs:Normal effort. Breath sounds with rhonchi. No wheeze.  Abdomen: Soft, Mild tenderness, wound vac with open wound intact, colostomy bag sm amount brown liquid stool. positive bowel sounds.  Extremities: No clubbing cyanosis or edema with positive pedal pulses.  Neuro: Grossly intact, nonfocal. Speech clear   The only change from discharge summary dated 12/01/11 is as follows:  Urine sensitivities back on culture that yielded staph aur. Rocephin discontinued and doxy 100mg  Bid started.  I have seen and examined the patient at bedside and blood work has been reviewed. Pt is stable for discharge to LTAC today. I have spent > 30 minutes discussing this with pt and husband at bedside and I have answered all their questions and concerns.  Debbora Presto [facility] [phone] Cell phone [phone]

## 2011-12-02 NOTE — Progress Notes (Signed)
Physical Therapy Treatment Patient Details Name: Terrence Pizana Mixson MRN: [id number] DOB: [birth year] Today's Date: 12/02/2011 Time: 4098-1191 PT Time Calculation (min): 24 min  PT Assessment / Plan / Recommendation Comments on Treatment Session  Notified pt was going to d/c today to [facility] (during PT session). Able to encourage pt to participate as tolerated. Pt continues to demonstrate deconditioning. Will need continued rehab.     Follow Up Recommendations  LTACH    Barriers to Discharge        Equipment Recommendations  Defer to next venue    Recommendations for Other Services    Frequency Min 3X/week   Plan Discharge plan remains appropriate    Precautions / Restrictions Precautions Precautions: Fall Precaution Comments: wound vac Restrictions Weight Bearing Restrictions: No   Pertinent Vitals/Pain     Mobility  Bed Mobility Bed Mobility: Not assessed Transfers Transfers: Stand to Sit;Sit to Stand Sit to Stand: 1: +2 Total assist;From chair/3-in-1;With upper extremity assist;With armrests Sit to Stand: Patient Percentage: 60% Stand to Sit: 1: +2 Total assist;To chair/3-in-1;With upper extremity assist;With armrests Stand to Sit: Patient Percentage: 60% Details for Transfer Assistance: x 2. VCs safety, technique, hand placement. Assist to rise, stabilize, control descent. Fatigues easily.  Ambulation/Gait Ambulation/Gait Assistance: 1: +2 Total assist Ambulation/Gait: Patient Percentage: 50% Ambulation/Gait Assistance Details: Pre-gait activitiy: forward stepping, backwards stepping x 4 (2 steps forwards/backwards) with seated rest break. Pt c/o feeling weak and sits quickly.  Gait Pattern: Step-to pattern;Wide base of support    Exercises General Exercises - Lower Extremity Long Arc Quad: AROM;Both;20 reps;Seated Hip Flexion/Marching: AROM;Both;20 reps;Seated   PT Diagnosis:    PT Problem List:   PT Treatment Interventions:     PT Goals Acute Rehab PT Goals PT Goal:  Sit to Stand - Progress: Progressing toward goal PT Goal: Stand to Sit - Progress: Progressing toward goal PT Goal: Ambulate - Progress: Progressing toward goal  Visit Information  Last PT Received On: 12/02/11 Assistance Needed: +2    Subjective Data  Subjective: "It's ususally the R leg thats weaker" Patient Stated Goal: Get stronger   Cognition  Overall Cognitive Status: Appears within functional limits for tasks assessed/performed Arousal/Alertness: Awake/alert Orientation Level: Appears intact for tasks assessed Behavior During Session: [facility] for tasks performed    Balance     End of Session PT - End of Session Activity Tolerance: Patient limited by fatigue Patient left: in chair;with call bell/phone within reach;with family/visitor present    Rebeca Alert [facility] 12/02/2011, 2:45 PM [phone]

## 2011-12-02 NOTE — Progress Notes (Signed)
Subjective: Lying in bed. No events during night.   Objective: Vital signs Filed Vitals:   12/01/11 2147 12/01/11 2229 12/02/11 0518 12/02/11 0909  BP:  106/66 117/73 116/66  Pulse:  89 81 86  Temp:  99.1 F (37.3 C) 98.1 F (36.7 C) 98.5 F (36.9 C)  TempSrc:  Oral Oral Oral  Resp:  19 18   Height:      Weight:   77.6 kg (171 lb 1.2 oz)   SpO2: 96% 97% 94%    Weight change: -0.2 kg (-7.1 oz) Last BM Date: 11/30/11  Intake/Output from previous day: 06/13 0701 - 06/14 0700 In: 1530 [P.O.:600; IV Piggyback:50; TPN:880] Out: 1575 [Urine:1575] Total I/O In: 120 [P.O.:120] Out: 330 [Urine:325; Stool:5]   Physical Exam: General: Alert, awake, oriented x3, in no acute distress. HEENT: No bruits, no goiter. Heart: Regular rate and rhythm, without murmurs, rubs, gallops. Lungs: Normal effort. Breath sounds coarse.  Abdomen:  Obese, Soft, nontender, nondistended, positive bowel sounds. Wound vac with open wound intact, colostomy bag pink stoma.  Extremities: No clubbing cyanosis or edema with positive pedal pulses. Neuro: Grossly intact, nonfocal.    Lab Results: Basic Metabolic Panel:  Basename 12/02/11 0410 12/01/11 0334  NA 130* 130*  K 4.3 3.9  CL 91* 89*  CO2 33* 33*  GLUCOSE 76 113*  BUN 34* 40*  CREATININE 0.98 0.97  CALCIUM 9.9 9.8  MG -- 2.0  PHOS -- 3.8   Liver Function Tests:  Basename 12/01/11 0334  AST 29  ALT 32  ALKPHOS 89  BILITOT 0.2*  PROT 5.7*  ALBUMIN 2.3*   No results found for this basename: LIPASE:2,AMYLASE:2 in the last 72 hours No results found for this basename: AMMONIA:2 in the last 72 hours CBC:  Basename 12/02/11 0410 12/01/11 0334  WBC 13.0* 15.8*  NEUTROABS -- --  HGB 8.0* 8.1*  HCT 26.0* 26.2*  MCV 91.9 91.9  PLT 222 254   Cardiac Enzymes: No results found for this basename: CKTOTAL:3,CKMB:3,CKMBINDEX:3,TROPONINI:3 in the last 72 hours BNP: No results found for this basename: PROBNP:3 in the last 72  hours D-Dimer: No results found for this basename: DDIMER:2 in the last 72 hours CBG:  Basename 12/02/11 0608 12/01/11 2226 12/01/11 1401 12/01/11 0626 11/30/11 2206 11/30/11 1521  GLUCAP 71 107* 124* 98 101* 116*   Hemoglobin A1C: No results found for this basename: HGBA1C in the last 72 hours Fasting Lipid Panel: No results found for this basename: CHOL,HDL,LDLCALC,TRIG,CHOLHDL,LDLDIRECT in the last 72 hours Thyroid Function Tests: No results found for this basename: TSH,T4TOTAL,FREET4,T3FREE,THYROIDAB in the last 72 hours Anemia Panel: No results found for this basename: VITAMINB12,FOLATE,FERRITIN,TIBC,IRON,RETICCTPCT in the last 72 hours Coagulation: No results found for this basename: LABPROT:2,INR:2 in the last 72 hours Urine Drug Screen: Drugs of Abuse  No results found for this basename: labopia, cocainscrnur, labbenz, amphetmu, thcu, labbarb    Alcohol Level: No results found for this basename: ETH:2 in the last 72 hours Urinalysis: No results found for this basename: COLORURINE:2,APPERANCEUR:2,LABSPEC:2,PHURINE:2,GLUCOSEU:2,HGBUR:2,BILIRUBINUR:2,KETONESUR:2,PROTEINUR:2,UROBILINOGEN:2,NITRITE:2,LEUKOCYTESUR:2 in the last 72 hours Misc. Labs:  Recent Results (from the past 240 hour(s))  URINE CULTURE     Status: Normal   Collection Time   11/29/11 12:07 PM      Component Value Range Status Comment   Specimen Description URINE, CATHETERIZED   Final    Special Requests NONE   Final    Culture  Setup Time [id number]   Final    Colony Count >=100,000 COLONIES/ML   Final  Culture     Final    Value: METHICILLIN RESISTANT STAPHYLOCOCCUS AUREUS     Note: RIFAMPIN AND GENTAMICIN SHOULD NOT BE USED AS SINGLE DRUGS FOR TREATMENT OF STAPH INFECTIONS. CRITICAL RESULT CALLED TO, READ BACK BY AND VERIFIED WITH: JOANNE SCOTTON @ 2346 ON 12/01/2011 HAJAM   Report Status 12/01/2011 FINAL   Final    Organism ID, Bacteria METHICILLIN RESISTANT STAPHYLOCOCCUS AUREUS   Final      Studies/Results: No results found.  Medications: Scheduled Meds:   . albuterol  2.5 mg Nebulization TID   And  . ipratropium  0.5 mg Nebulization TID  . antiseptic oral rinse  15 mL Mouth Rinse QID  . chlorhexidine  15 mL Mouth Rinse BID  . doxycycline  100 mg Oral Q12H  . DULoxetine  30 mg Oral BID  . famotidine  20 mg Oral QHS  . feeding supplement  237 mL Oral BID BM  . guaiFENesin  600 mg Oral BID  . heparin subcutaneous  5,000 Units Subcutaneous Q8H  . insulin aspart  0-9 Units Subcutaneous Q8H  . levothyroxine  50 mcg Oral QAC breakfast  . metoprolol tartrate  25 mg Oral BID  . montelukast  10 mg Oral QHS  . predniSONE  7.5 mg Oral Q breakfast  . sodium chloride  10-40 mL Intracatheter Q12H  . DISCONTD: cefTRIAXone (ROCEPHIN)  IV  2 g Intravenous Q24H   Continuous Infusions:  PRN Meds:.acetaminophen, albuterol, ALPRAZolam, diphenhydrAMINE, fentaNYL, ipratropium, ondansetron (ZOFRAN) IV, ondansetron, sodium chloride  Assessment/Plan:  Principal Problem:  *Perforated diverticulitis Active Problems:  HYPOTHYROIDISM  Atrial fibrillation  HEART FAILURE  History of hypertension  Respiratory failure following trauma and surgery  Chronic obstructive asthma  Peritonitis  Hypotension  Acute respiratory failure Acute respiratory Failure, post op 6/1>>resolved   COPD/fixed asthma, (Xolair, Singulair, Pred are chronic meds). Continue oxygen as needed to keep SpO2 > 92% >>Raynaud's phenomena makes pulse ox readings difficult . Continue home dose of prednisone (7.5 mg daily) . nebulizer therapy . Continue montelukast . Results chest xray 11/29/11 neg.  Afib w/ RVR, chronic systolic CHF (EF 16-10%)  Cardiology following>>defer anticoagulation, antiarrhythmic tx for now . Continue lasix, BB. Strict I & O's. Volume status -7.1L.   AKI / ATN, resolved.   Hypokalemia  Likely related to gi losses. Repleted and resoved. Will monitor. Mag level WNL   Hyponatremia:  Stable at  130. Likely related to lasix. May be a little too dry. Volume status -7.1L Lasix held for 2 days.   Monitor.   Perforated diverticulitis s/p sigmoid colectomy complicated by wound dehisence, and post op ileus.  Advance diet per CCS>> on 6/13. TNA discontinued 6/13. Continue wound vac per [facility].   Anemia of critical illness, and chronic disease  Hg stable in range 8-9. Will monitor closely. Transfuse for Hb < 7   Diverticular abscess due to perforated divertic/ low grade sepsis  Urine culture 5/24 w/15K mult organism. Blood culture x2 5/24 neg. Pt received Vancomycin 5/24 only then Zosyn 5/24 >>6/3 . Completed course of Abx>>monitor clinically   Leukocytosis: afebrile, non-toxic appearing, chest xray yesterday without acute changes urine with likely UTI. Pt refusing to have foley removed. Cultures with staph aur. Rocephin changed to Doxy based on sensitivities on 12/02/11   Hypothyroidism  Continue synthroid.   Pain control  Prn tylenol, fentanyl   DISPOSITION Medically ready for discharge to LTAC. Will discharge to LTAC when bed available.     LOS: 21 days   [facility]  M 12/02/2011, 1:07 PM

## 2011-12-02 NOTE — Progress Notes (Signed)
Lab. Notified RN that the urine that was catheterized had >= 100,00 colonies/ml of MRSA.  MD was notified of this lab. result.

## 2011-12-02 NOTE — Consult Note (Addendum)
WOC consult Note Reason for Consult: V.A.C. Dressing change and ostomy pouch change.  PA Will Marlyne Beards gave order for WOC nurse to remove three (3) retention sutures today during V.A.C. Dressing change. Wound type:Surgical Pressure Ulcer POA: No Measurement: 22cm x 6.5cm x .4cm  Wound bed: proximal third is covered with biologic mesh (evidence of cell migration noted at 5 o'clock).  Distal 2/3 of wound is open and granulating. There is no necrotic tissue. Drainage (amount, consistency, odor) Small amount of serosanguinous drainage in tubing. Periwound: intact with retention suture sites moist, but clean Dressing procedure/placement/frequency:Continue NPWT (white foam over biologic mesh, black foam over distal portion of wound), 100 mmHg, continuous suction. [facility] for [facility].  We will remain available as needed to support this patient post acute care discharge. Patient established with secure start ostomy discharge program.  Stoma measures 1 and 5/8 inches round and is budded, slightly moist with soft brown stool evident. Ladona Mow, MSN, RN, GNP, CWOCN [phone])

## 2011-12-02 NOTE — Progress Notes (Signed)
General Surgery [facility] - Attending  Stable.  To [facility] when bed available.  Continue VAC wound care - may remove retention sutures.  Velora Heckler, MD, [facility] Office: [phone]

## 2011-12-03 LAB — COMPREHENSIVE METABOLIC PANEL
BUN: 29 mg/dL — ABNORMAL HIGH (ref 6–23)
CO2: 33 mEq/L — ABNORMAL HIGH (ref 19–32)
Calcium: 9.6 mg/dL (ref 8.4–10.5)
Creatinine, Ser: 1.04 mg/dL (ref 0.50–1.10)
GFR calc Af Amer: 59 mL/min — ABNORMAL LOW (ref >90)
GFR calc non Af Amer: 51 mL/min — ABNORMAL LOW (ref >90)
Glucose, Bld: 81 mg/dL (ref 70–99)
Total Protein: 5.2 g/dL — ABNORMAL LOW (ref 6.0–8.3)

## 2011-12-03 LAB — PRO B NATRIURETIC PEPTIDE: Pro B Natriuretic peptide (BNP): 2410 pg/mL — ABNORMAL HIGH (ref 0–450)

## 2011-12-03 LAB — CBC
HCT: 23.3 % — ABNORMAL LOW (ref 36.0–46.0)
Hemoglobin: 7.3 g/dL — ABNORMAL LOW (ref 12.0–15.0)
WBC: 8.5 10*3/uL (ref 4.0–10.5)

## 2011-12-03 LAB — HIGH SENSITIVITY CRP: CRP, High Sensitivity: 88.6 mg/L — ABNORMAL HIGH

## 2011-12-03 LAB — ABO/RH: ABO/RH(D): A POS

## 2011-12-03 LAB — SEDIMENTATION RATE: Sed Rate: 70 mm/hr — ABNORMAL HIGH (ref 0–22)

## 2011-12-04 ENCOUNTER — Other Ambulatory Visit (HOSPITAL_COMMUNITY): Payer: Medicare Other

## 2011-12-04 ENCOUNTER — Institutional Professional Consult (permissible substitution) (HOSPITAL_COMMUNITY): Payer: Medicare Other

## 2011-12-04 LAB — CBC
HCT: 31.9 % — ABNORMAL LOW (ref 36.0–46.0)
Hemoglobin: 10.4 g/dL — ABNORMAL LOW (ref 12.0–15.0)
MCV: 86.7 fL (ref 78.0–100.0)
RDW: 16.6 % — ABNORMAL HIGH (ref 11.5–15.5)
WBC: 9.1 10*3/uL (ref 4.0–10.5)

## 2011-12-04 LAB — BASIC METABOLIC PANEL
BUN: 31 mg/dL — ABNORMAL HIGH (ref 6–23)
CO2: 36 mEq/L — ABNORMAL HIGH (ref 19–32)
Calcium: 9.2 mg/dL (ref 8.4–10.5)
Chloride: 92 mEq/L — ABNORMAL LOW (ref 96–112)
Creatinine, Ser: 1.08 mg/dL (ref 0.50–1.10)
GFR calc Af Amer: 57 mL/min — ABNORMAL LOW (ref >90)
Glucose, Bld: 89 mg/dL (ref 70–99)
Potassium: 3.5 mEq/L (ref 3.5–5.1)

## 2011-12-04 MED ORDER — IOHEXOL 300 MG/ML  SOLN
100.0000 mL | Freq: Once | INTRAMUSCULAR | Status: AC | PRN
  Administered 2011-12-04: 100 mL via INTRAVENOUS

## 2011-12-05 DIAGNOSIS — K9420 Gastrostomy complication, unspecified: Secondary | ICD-10-CM

## 2011-12-05 LAB — TYPE AND SCREEN
Antibody Screen: NEGATIVE
Unit division: 0

## 2011-12-05 LAB — BASIC METABOLIC PANEL
BUN: 30 mg/dL — ABNORMAL HIGH (ref 6–23)
CO2: 35 mEq/L — ABNORMAL HIGH (ref 19–32)
Chloride: 89 mEq/L — ABNORMAL LOW (ref 96–112)
GFR calc non Af Amer: 43 mL/min — ABNORMAL LOW (ref >90)
Glucose, Bld: 90 mg/dL (ref 70–99)
Potassium: 3.8 mEq/L (ref 3.5–5.1)
Sodium: 132 mEq/L — ABNORMAL LOW (ref 135–145)

## 2011-12-05 LAB — PROCALCITONIN: Procalcitonin: 0.24 ng/mL

## 2011-12-05 LAB — PROTIME-INR
INR: 1.01 (ref 0.00–1.49)
Prothrombin Time: 13.5 seconds (ref 11.6–15.2)

## 2011-12-05 LAB — MAGNESIUM: Magnesium: 1.9 mg/dL (ref 1.5–2.5)

## 2011-12-05 LAB — HEMOGLOBIN AND HEMATOCRIT, BLOOD: HCT: 33.6 % — ABNORMAL LOW (ref 36.0–46.0)

## 2011-12-05 NOTE — Consult Note (Signed)
Reason for Consult:PEG tube dysfunction Referring Physician: Glenette Bookwalter Greeson is an 78 y.o. female.  HPI: 78 yr old female who was transferred to [facility] from [facility] after undergoing Exploratory laparotomy with sigmoid colectomy with end-colostomy and drainage of intra-abdominal abscesses on 11/12/11 with 2nd surgery on 11/19/11 Exploratory laparotomy, placement of #24 mushroom gastrostomy tube, insertion of biologics mesh (Strattice 16 x 20 cm), closure of dehiscence, application of wound vacuum-assisted closure.  She has been tolerating a diet and has not reguired use of her PEG tube.  She has been having abdominal pain and nausea which prompted a CT last night.  This showed possible malposition of the PEG tube outside the stomach.    Past Medical History  Diagnosis Date  . Insomnia, unspecified   . Osteoarthrosis, unspecified whether generalized or localized, unspecified site   . Unspecified hypothyroidism   . Unspecified essential hypertension   . Atrial fibrillation   . Sciatica   . Anemia   . COPD (chronic obstructive pulmonary disease)   . UTI (urinary tract infection)   . GERD (gastroesophageal reflux disease)   . Pneumonia   . Asthma   . Clostridium difficile colitis   . Diverticulitis of sigmoid colon 11/2010    with abscess/contained perforation  . Dementia     mild  . Dehydration with hyponatremia 12/2010    hospitalized  . Angiodysplasia of stomach     ablated 2010  . Angiodysplasia of colon     ablated 2010  . Vitamin B12 deficiency (dietary) anemia 04/12/2011  . Shingles   . Sarcoma     right hand  . Malignant neoplasm of breast (female), unspecified site     left  . Dupuytren's disease     left hand    Past Surgical History  Procedure Date  . Nasal sinus surgery     x 3  . Appendectomy   . Hernia repair   . Abdominal hysterectomy   . Pacemaker insertion   . Eye surgery     x6-bilateral  . Finger amputation     right  . Foot surgery     right  toes x 3  . Tonsillectomy   . Colonoscopy 01/2009    w/ablation of 3 mm AVM, diverticulosis, external hemorrhoids  . Upper gastrointestinal endoscopy 01/2009    w/polypectomy, AVM, hyperplastic polyp  . Abdominal hysterectomy   . Laparotomy 11/12/2011    Procedure: EXPLORATORY LAPAROTOMY;  Surgeon: Lodema Pilot, DO;  Location: [facility];  Service: General;  Laterality: N/A;  . Colostomy revision 11/12/2011    Procedure: COLON RESECTION SIGMOID;  Surgeon: Lodema Pilot, DO;  Location: [facility];  Service: General;  Laterality: N/A;  . Colostomy 11/12/2011    Procedure: COLOSTOMY;  Surgeon: Lodema Pilot, DO;  Location: [facility];  Service: General;  Laterality: Left;  End colostomy  . Laparotomy 11/19/2011    Procedure: EXPLORATORY LAPAROTOMY;  Surgeon: Kandis Cocking, MD;  Location: [facility];  Service: General;  Laterality: N/A;  . Gastrostomy 11/19/2011    Procedure: GASTROSTOMY;  Surgeon: Kandis Cocking, MD;  Location: [facility];  Service: General;;  INSERTION GASTROSTOMY TUBE  . Application of wound vac 11/19/2011    Procedure: APPLICATION OF WOUND VAC;  Surgeon: Kandis Cocking, MD;  Location: [facility];  Service: General;  Laterality: N/A;    Family History  Problem Relation Age of Onset  . Emphysema Paternal Uncle   . Heart disease Mother   . Colon cancer Mother   .  Pancreatic cancer Father   . Tuberculosis Paternal Uncle   . Breast cancer Maternal Aunt   . Breast cancer Cousin   . Prostate cancer Cousin   . Colon cancer Cousin   . Uterine cancer Cousin     Social History:  reports that she quit smoking about 34 years ago. She has never used smokeless tobacco. She reports that she drinks alcohol. She reports that she does not use illicit drugs.  Allergies:  Allergies  Allergen Reactions  . Aspirin   . Ibuprofen   . Iron Dextran     REACTION: itching after test dose  . Metoclopramide Hcl     REACTION: chomping mouth  . Sulfonamide Derivatives     Medications: I have reviewed the patient's  current medications.  Results for orders placed during the hospital encounter of 12/02/11 (from the past 48 hour(s))  TYPE AND SCREEN     Status: Normal (Preliminary result)   Collection Time   12/03/11 10:55 AM      Component Value Range Comment   ABO/RH(D) A POS      Antibody Screen NEG      Sample Expiration 12/06/2011      Unit Number [id number]      Blood Component Type RED CELLS,LR      Unit division 00      Status of Unit ISSUED,FINAL      Transfusion Status OK TO TRANSFUSE      Crossmatch Result Compatible      Unit Number [id number]      Blood Component Type RED CELLS,LR      Unit division 00      Status of Unit ISSUED      Transfusion Status OK TO TRANSFUSE      Crossmatch Result Compatible     PREPARE RBC (CROSSMATCH)     Status: Normal   Collection Time   12/03/11 10:55 AM      Component Value Range Comment   Order Confirmation ORDER PROCESSED BY BLOOD BANK     ABO/RH     Status: Normal   Collection Time   12/03/11 10:55 AM      Component Value Range Comment   ABO/RH(D) A POS     OCCULT BLOOD X 1 CARD TO LAB, STOOL     Status: Normal   Collection Time   12/04/11  4:00 AM      Component Value Range Comment   Fecal Occult Bld NEGATIVE     CBC     Status: Abnormal   Collection Time   12/04/11  6:10 AM      Component Value Range Comment   WBC 9.1  4.0 - 10.5 K/uL    RBC 3.68 (*) 3.87 - 5.11 MIL/uL    Hemoglobin 10.4 (*) 12.0 - 15.0 g/dL    HCT 57.8 (*) 46.9 - 46.0 %    MCV 86.7  78.0 - 100.0 fL    MCH 28.3  26.0 - 34.0 pg    MCHC 32.6  30.0 - 36.0 g/dL    RDW 62.9 (*) 52.8 - 15.5 %    Platelets 178  150 - 400 K/uL   BASIC METABOLIC PANEL     Status: Abnormal   Collection Time   12/04/11  6:10 AM      Component Value Range Comment   Sodium 136  135 - 145 mEq/L    Potassium 3.5  3.5 - 5.1 mEq/L    Chloride 92 (*) 96 - 112  mEq/L    CO2 36 (*) 19 - 32 mEq/L    Glucose, Bld 89  70 - 99 mg/dL    BUN 31 (*) 6 - 23 mg/dL    Creatinine, Ser 1.61  0.50 - 1.10 mg/dL      Calcium 9.2  8.4 - 10.5 mg/dL    GFR calc non Af Amer 49 (*) >90 mL/min    GFR calc Af Amer 57 (*) >90 mL/min   OCCULT BLOOD X 1 CARD TO LAB, STOOL     Status: Normal   Collection Time   12/04/11  3:15 PM      Component Value Range Comment   Fecal Occult Bld NEGATIVE     BASIC METABOLIC PANEL     Status: Abnormal   Collection Time   12/05/11  5:25 AM      Component Value Range Comment   Sodium 132 (*) 135 - 145 mEq/L    Potassium 3.8  3.5 - 5.1 mEq/L    Chloride 89 (*) 96 - 112 mEq/L    CO2 35 (*) 19 - 32 mEq/L    Glucose, Bld 90  70 - 99 mg/dL    BUN 30 (*) 6 - 23 mg/dL    Creatinine, Ser 0.96 (*) 0.50 - 1.10 mg/dL    Calcium 9.1  8.4 - 04.5 mg/dL    GFR calc non Af Amer 43 (*) >90 mL/min    GFR calc Af Amer 49 (*) >90 mL/min   PHOSPHORUS     Status: Normal   Collection Time   12/05/11  5:25 AM      Component Value Range Comment   Phosphorus 3.3  2.3 - 4.6 mg/dL   MAGNESIUM     Status: Normal   Collection Time   12/05/11  5:25 AM      Component Value Range Comment   Magnesium 1.9  1.5 - 2.5 mg/dL   PROCALCITONIN     Status: Normal   Collection Time   12/05/11  5:25 AM      Component Value Range Comment   Procalcitonin 0.24       Ct Abdomen W Contrast  12/04/2011  *RADIOLOGY REPORT*  Clinical Data:  Chronic abdominal pain.  GI bleeding.  CT ABDOMEN WITH CONTRAST  Technique:  Multidetector CT imaging of the abdomen was performed using the standard protocol following bolus administration of intravenous contrast.  Contrast: OMNIPAQUE IOHEXOL 300 MG/ML  SOLN  Comparison:  11/11/2011  Findings:  Increased left lower lobe atelectasis seen since previous study.  A percutaneous gastrostomy tube is now seen in place, however the tube traverses through the gastric lumen with the tip located external to the gastric lumen along the inferior aspect of the gastric body.  A small amount of extraluminal gas is seen adjacent to the stomach but no extraluminal fluid collections are seen.   Several tiny hepatic cysts are again noted which remains stable. No definite liver masses are identified.  The spleen, pancreas, and adrenal glands are normal appearance.  Left renal cysts and a tiny less than 1 cm benign angiomyolipoma in the lower pole of the right kidney remain stable.  No lymphadenopathy identified.  No acute inflammatory process or abscess identified within the abdomen.  IMPRESSION:  1. Abnormal extraluminal position of distal tip of percutaneous gastrostomy tube, with small amount of extraluminal gas adjacent to the stomach.  No abnormal extraluminal fluid collection/abscess identified. 2.  Increased left lower lobe atelectasis.  Critical Value/emergent results  were called by telephone at the time of interpretation on 12/04/2011  at 2310 hours  to  Dr. Sharyon Medicus in [facility], who verbally acknowledged these results.  Original Report Authenticated By: Danae Orleans, M.D.   Dg Chest Port 1 View  12/04/2011  *RADIOLOGY REPORT*  Clinical Data: Hypoxia  PORTABLE CHEST - 1 VIEW  Comparison: 11/29/2011; 12/03/2011; 11/22/2011; 05/12/2010  Findings: Grossly unchanged cardiac silhouette and mediastinal contours.  Stable position of support apparatus. Unchanged mild elevation of the right hemidiaphragm.  Unchanged small left-sided effusion and left basilar opacities.  There is mild cephalization of flow without frank evidence of pulmonary edema.  Unchanged granuloma overlying the left upper lung.  Unchanged deformity of the posterior lateral aspect of several right-sided ribs.  IMPRESSION: Grossly unchanged small left-sided effusion and left basilar opacities, atelectasis versus infiltrate.  Original Report Authenticated By: Waynard Reeds, M.D.    Review of Systems  Constitutional: Positive for malaise/fatigue. Negative for fever and chills.  HENT: Negative for hearing loss and neck pain.   Eyes: Negative for blurred vision and double vision.  Respiratory: Positive for shortness of breath and  wheezing. Negative for cough.   Cardiovascular: Positive for orthopnea. Negative for chest pain and palpitations.  Gastrointestinal: Positive for nausea and abdominal pain. Negative for heartburn and vomiting.  Genitourinary: Negative for dysuria and urgency.  Musculoskeletal: Negative for myalgias.  Skin: Negative for rash.  Neurological: Negative for dizziness, tingling, focal weakness and headaches.  Endo/Heme/Allergies: Does not bruise/bleed easily.  Psychiatric/Behavioral: Negative for depression. The patient is nervous/anxious (about further surgery).    There were no vitals taken for this visit. Physical Exam  Constitutional: She is oriented to person, place, and time. No distress.       Chronically ill appearing  HENT:  Head: Normocephalic and atraumatic.  Eyes: EOM are normal. Pupils are equal, round, and reactive to light.  Neck: Normal range of motion. Neck supple.  Cardiovascular: Normal rate and regular rhythm.   Respiratory: Effort normal.       Decreased bs in bases   GI: Bowel sounds are normal. She exhibits distension (mildly). There is tenderness (mildly).  Genitourinary:       Deferred   Musculoskeletal: Normal range of motion. She exhibits no edema.  Neurological: She is alert and oriented to person, place, and time.  Skin: Skin is warm and dry.  Psychiatric: She has a normal mood and affect. Her behavior is normal.    Assessment/Plan: PEG malposition: Dr. Jamey Ripa has seen the patient and will discuss her CT scan with the interventional radiologist as to whether or not this is truly outside the stomach and whether it can be managed by IR or if surgical intervention will be needed.  Patient is currently NPO and we will maintain that for now.  Nothing via PEG.  Waldine Zenz 12/05/2011, 9:45 AM

## 2011-12-05 NOTE — Consult Note (Signed)
Agree wit note per EW. PA. I reveiwed the xrays with radiologist and discuss it with Dr. Ezzard Standing who placed a gastrostomy tube. It appears gastrostomy tube enters the stomach and exits but there does appear to be a gross leak. If not 100% clear that the tube as it simply backed out and is lying along the anterior stomach. As she does not have peritonitis and she is going to do very poorly if we were to operate on her again, I think the best thing to do here is to tube on drainage and if she is stable leave it on drainage for several weeks before trying to remove it or manipulate it.

## 2011-12-06 ENCOUNTER — Encounter: Payer: Self-pay | Admitting: Internal Medicine

## 2011-12-06 LAB — CBC
MCH: 28.8 pg (ref 26.0–34.0)
MCHC: 32.6 g/dL (ref 30.0–36.0)
Platelets: 147 10*3/uL — ABNORMAL LOW (ref 150–400)
RDW: 16.2 % — ABNORMAL HIGH (ref 11.5–15.5)

## 2011-12-06 LAB — BASIC METABOLIC PANEL
Calcium: 8.7 mg/dL (ref 8.4–10.5)
GFR calc Af Amer: 48 mL/min — ABNORMAL LOW (ref >90)
GFR calc non Af Amer: 41 mL/min — ABNORMAL LOW (ref >90)
Sodium: 133 mEq/L — ABNORMAL LOW (ref 135–145)

## 2011-12-06 LAB — OCCULT BLOOD X 1 CARD TO LAB, STOOL: Fecal Occult Bld: NEGATIVE

## 2011-12-06 NOTE — Progress Notes (Signed)
Patient ID: [name], female   DOB: [birth year], 78 y.o.   MRN: [id number]    Subjective: Pt reports feeling much better now.  Denies abd pain, tolerating diet, no complaints about PEG  Objective: Vital signs in last 24 hours:      Intake/Output from previous day:   Intake/Output this shift:    PE: Abd: soft, mildly tender with deep palp, vac in place, ostomy with op and pink, PEG tube with min drainage to bag  Lab Results:   Basename 12/06/11 0515 12/05/11 1006 12/04/11 0610  WBC 7.7 -- 9.1  HGB 10.0* 10.7* --  HCT 30.7* 33.6* --  PLT 147* -- 178   BMET  Basename 12/06/11 0515 12/05/11 0525  NA 133* 132*  K 3.6 3.8  CL 90* 89*  CO2 35* 35*  GLUCOSE 79 90  BUN 31* 30*  CREATININE 1.24* 1.21*  CALCIUM 8.7 9.1   PT/INR  Basename 12/05/11 1006  LABPROT 13.5  INR 1.01   CMP     Component Value Date/Time   NA 133* 12/06/2011 0515   K 3.6 12/06/2011 0515   CL 90* 12/06/2011 0515   CO2 35* 12/06/2011 0515   GLUCOSE 79 12/06/2011 0515   BUN 31* 12/06/2011 0515   CREATININE 1.24* 12/06/2011 0515   CALCIUM 8.7 12/06/2011 0515   PROT 5.2* 12/03/2011 0435   ALBUMIN 2.1* 12/03/2011 0435   AST 23 12/03/2011 0435   ALT 28 12/03/2011 0435   ALKPHOS 88 12/03/2011 0435   BILITOT 0.2* 12/03/2011 0435   GFRNONAA 41* 12/06/2011 0515   GFRAA 48* 12/06/2011 0515   Lipase  No results found for this basename: lipase       Studies/Results: Ct Abdomen W Contrast  12/04/2011  *RADIOLOGY REPORT*  Clinical Data:  Chronic abdominal pain.  GI bleeding.  CT ABDOMEN WITH CONTRAST  Technique:  Multidetector CT imaging of the abdomen was performed using the standard protocol following bolus administration of intravenous contrast.  Contrast: OMNIPAQUE IOHEXOL 300 MG/ML  SOLN  Comparison:  11/11/2011  Findings:  Increased left lower lobe atelectasis seen since previous study.  A percutaneous gastrostomy tube is now seen in place, however the tube traverses through the gastric lumen with the  tip located external to the gastric lumen along the inferior aspect of the gastric body.  A small amount of extraluminal gas is seen adjacent to the stomach but no extraluminal fluid collections are seen.  Several tiny hepatic cysts are again noted which remains stable. No definite liver masses are identified.  The spleen, pancreas, and adrenal glands are normal appearance.  Left renal cysts and a tiny less than 1 cm benign angiomyolipoma in the lower pole of the right kidney remain stable.  No lymphadenopathy identified.  No acute inflammatory process or abscess identified within the abdomen.  IMPRESSION:  1. Abnormal extraluminal position of distal tip of percutaneous gastrostomy tube, with small amount of extraluminal gas adjacent to the stomach.  No abnormal extraluminal fluid collection/abscess identified. 2.  Increased left lower lobe atelectasis.  Critical Value/emergent results were called by telephone at the time of interpretation on 12/04/2011  at 2310 hours  to  Dr. Sharyon Medicus in [facility], who verbally acknowledged these results.  Original Report Authenticated By: Danae Orleans, M.D.    Anti-infectives: Anti-infectives    None       Assessment/Plan  1.  PEG tube dysfunction: unsure if PEG tube is truly misplaced or not, pt had significant pain with injection  of contrast which does suggest mis-position, at this time will leave PEG tube to gravity drainage and NOT USE it.  Will likely need re-evaluation in 6-8 weeks to decide if PEG tube can be removed.  If needed may need to do a swallow gastrograffin study to verify if there is any leak around the tube.   LOS: 4 days    [name] 12/06/2011

## 2011-12-06 NOTE — Progress Notes (Signed)
Agree with A&P per EW,PA. No additional thoughts. Will defer further eval of gastrostomy tube for a few days

## 2011-12-07 LAB — BASIC METABOLIC PANEL
Calcium: 8.5 mg/dL (ref 8.4–10.5)
GFR calc Af Amer: 51 mL/min — ABNORMAL LOW (ref >90)
GFR calc non Af Amer: 44 mL/min — ABNORMAL LOW (ref >90)
Glucose, Bld: 87 mg/dL (ref 70–99)
Sodium: 130 mEq/L — ABNORMAL LOW (ref 135–145)

## 2011-12-07 LAB — TSH: TSH: 8.838 u[IU]/mL — ABNORMAL HIGH (ref 0.350–4.500)

## 2011-12-07 NOTE — Progress Notes (Signed)
Abdomen is still soft and she is tolerating diet. Anticipate leaving g tube in for six weeks or more

## 2011-12-07 NOTE — Progress Notes (Signed)
Patient ID: [name], female   DOB: 1933-11-21, 78 y.o.   MRN: [id number] Patient ID: [name], female   DOB: [birth year], 78 y.o.   MRN: [id number]    Subjective: No complaints, resting comfortably, Denies abd pain, tolerating diet, no complaints about PEG  Objective: Vital signs in last 24 hours:      Intake/Output from previous day:   Intake/Output this shift:    PE: Abd: soft, mildly tender with deep palp, vac in place, ostomy with op and pink, PEG tube with min drainage to bag  Lab Results:   Basename 12/06/11 0515 12/05/11 1006  WBC 7.7 --  HGB 10.0* 10.7*  HCT 30.7* 33.6*  PLT 147* --   BMET  Basename 12/07/11 0430 12/06/11 0515  NA 130* 133*  K 3.3* 3.6  CL 89* 90*  CO2 33* 35*  GLUCOSE 87 79  BUN 30* 31*  CREATININE 1.17* 1.24*  CALCIUM 8.5 8.7   PT/INR  Basename 12/05/11 1006  LABPROT 13.5  INR 1.01   CMP     Component Value Date/Time   NA 130* 12/07/2011 0430   K 3.3* 12/07/2011 0430   CL 89* 12/07/2011 0430   CO2 33* 12/07/2011 0430   GLUCOSE 87 12/07/2011 0430   BUN 30* 12/07/2011 0430   CREATININE 1.17* 12/07/2011 0430   CALCIUM 8.5 12/07/2011 0430   PROT 5.2* 12/03/2011 0435   ALBUMIN 2.1* 12/03/2011 0435   AST 23 12/03/2011 0435   ALT 28 12/03/2011 0435   ALKPHOS 88 12/03/2011 0435   BILITOT 0.2* 12/03/2011 0435   GFRNONAA 44* 12/07/2011 0430   GFRAA 51* 12/07/2011 0430   Lipase  No results found for this basename: lipase       Studies/Results: No results found.  Anti-infectives: Anti-infectives    None       Assessment/Plan  1.  PEG tube dysfunction: unsure if PEG tube is truly misplaced or not, pt had significant pain with injection of contrast which does suggest mis-position, at this time will leave PEG tube to gravity drainage and NOT USE it. If needed may need to do a swallow gastrograffin study to verify if there is any leak around the tube.   LOS: 5 days    Sayer Masini 12/07/2011

## 2011-12-08 LAB — BASIC METABOLIC PANEL
BUN: 30 mg/dL — ABNORMAL HIGH (ref 6–23)
Chloride: 92 mEq/L — ABNORMAL LOW (ref 96–112)
Creatinine, Ser: 1.22 mg/dL — ABNORMAL HIGH (ref 0.50–1.10)
GFR calc Af Amer: 49 mL/min — ABNORMAL LOW (ref >90)

## 2011-12-08 NOTE — Progress Notes (Signed)
Abdomen is still benign so will continue watchful waiting

## 2011-12-08 NOTE — Progress Notes (Signed)
Patient ID: Shirla Hodgkiss Devoto, female   DOB: [birth year], 78 y.o.   MRN: [id number]    Subjective: Had some surface pain around PEG from a stitch but no problems since dressing changed,  resting comfortably, Denies abd pain, tolerating diet, denies back pain  Objective: Vital signs in last 24 hours:      Intake/Output from previous day:   Intake/Output this shift:    PE: Abd: soft, mildly tender with deep palp, vac in place, ostomy with op and pink, PEG tube with min drainage to bag  Lab Results:   Basename 12/06/11 0515 12/05/11 1006  WBC 7.7 --  HGB 10.0* 10.7*  HCT 30.7* 33.6*  PLT 147* --   BMET  Basename 12/08/11 0410 12/07/11 0430  NA 132* 130*  K 3.5 3.3*  CL 92* 89*  CO2 32 33*  GLUCOSE 86 87  BUN 30* 30*  CREATININE 1.22* 1.17*  CALCIUM 9.0 8.5   PT/INR  Basename 12/05/11 1006  LABPROT 13.5  INR 1.01   CMP     Component Value Date/Time   NA 132* 12/08/2011 0410   K 3.5 12/08/2011 0410   CL 92* 12/08/2011 0410   CO2 32 12/08/2011 0410   GLUCOSE 86 12/08/2011 0410   BUN 30* 12/08/2011 0410   CREATININE 1.22* 12/08/2011 0410   CALCIUM 9.0 12/08/2011 0410   PROT 5.2* 12/03/2011 0435   ALBUMIN 2.1* 12/03/2011 0435   AST 23 12/03/2011 0435   ALT 28 12/03/2011 0435   ALKPHOS 88 12/03/2011 0435   BILITOT 0.2* 12/03/2011 0435   GFRNONAA 42* 12/08/2011 0410   GFRAA 49* 12/08/2011 0410   Lipase  No results found for this basename: lipase       Studies/Results: No results found.  Anti-infectives: Anti-infectives    None       Assessment/Plan  1.  PEG tube dysfunction: unsure if PEG tube is truly misplaced or not, pt had significant pain with injection of contrast which does suggest mis-position, at this time will leave PEG tube to gravity drainage and NOT USE it.  No changes from surgical standpoint today    LOS: 6 days    Verne Lanuza 12/08/2011

## 2011-12-09 NOTE — Progress Notes (Signed)
Patient ID: [name], female   DOB: [birth year], 78 y.o.   MRN: [id number] Patient ID: [name], female   DOB: Nov 24, 1933, 78 y.o.   MRN: [id number]    Subjective: No complaints today,  resting comfortably, Denies abd pain, tolerating diet, denies back pain  Objective: Vital signs in last 24 hours:      Intake/Output from previous day:   Intake/Output this shift:    PE: Abd: soft, mildly tender with deep palp, vac in place, ostomy with op and pink, PEG tube with min drainage to bag  Lab Results:  No results found for this basename: WBC:2,HGB:2,HCT:2,PLT:2 in the last 72 hours BMET  Basename 12/08/11 0410 12/07/11 0430  NA 132* 130*  K 3.5 3.3*  CL 92* 89*  CO2 32 33*  GLUCOSE 86 87  BUN 30* 30*  CREATININE 1.22* 1.17*  CALCIUM 9.0 8.5   PT/INR No results found for this basename: LABPROT:2,INR:2 in the last 72 hours CMP     Component Value Date/Time   NA 132* 12/08/2011 0410   K 3.5 12/08/2011 0410   CL 92* 12/08/2011 0410   CO2 32 12/08/2011 0410   GLUCOSE 86 12/08/2011 0410   BUN 30* 12/08/2011 0410   CREATININE 1.22* 12/08/2011 0410   CALCIUM 9.0 12/08/2011 0410   PROT 5.2* 12/03/2011 0435   ALBUMIN 2.1* 12/03/2011 0435   AST 23 12/03/2011 0435   ALT 28 12/03/2011 0435   ALKPHOS 88 12/03/2011 0435   BILITOT 0.2* 12/03/2011 0435   GFRNONAA 42* 12/08/2011 0410   GFRAA 49* 12/08/2011 0410   Lipase  No results found for this basename: lipase       Studies/Results: No results found.  Anti-infectives: Anti-infectives    None       Assessment/Plan  1.  PEG tube dysfunction: at this time will leave PEG tube to gravity drainage and NOT USE it.  No changes from surgical standpoint today    LOS: 7 days    Tavares Levinson 12/09/2011

## 2011-12-09 NOTE — Progress Notes (Signed)
Stable and no apparent issues with G-tube. Will see again Monday

## 2011-12-12 LAB — BASIC METABOLIC PANEL
BUN: 24 mg/dL — ABNORMAL HIGH (ref 6–23)
Creatinine, Ser: 1.14 mg/dL — ABNORMAL HIGH (ref 0.50–1.10)
GFR calc Af Amer: 53 mL/min — ABNORMAL LOW (ref >90)
GFR calc non Af Amer: 46 mL/min — ABNORMAL LOW (ref >90)
Potassium: 4.1 mEq/L (ref 3.5–5.1)

## 2011-12-12 LAB — CBC
HCT: 30.3 % — ABNORMAL LOW (ref 36.0–46.0)
MCHC: 31.7 g/dL (ref 30.0–36.0)
MCV: 89.6 fL (ref 78.0–100.0)
RDW: 15.9 % — ABNORMAL HIGH (ref 11.5–15.5)

## 2011-12-12 NOTE — Progress Notes (Signed)
Leave PEG in place to drainage.  Wilmon Arms. [name], MD, [facility]  12/12/2011 3:37 PM

## 2011-12-12 NOTE — Progress Notes (Signed)
Patient ID: [name], female   DOB: May 09, 1934, 78 y.o.   MRN: [id number]    Subjective: No complaints today,  Up eating tray, Denies abd pain, denies back pain  Objective: Vital signs in last 24 hours:      Intake/Output from previous day:   Intake/Output this shift:    PE: Abd: soft, mildly tender with deep palp, vac in place, ostomy with op and pink, PEG tube with min drainage to bag, some irritation around PEG tube, some skin blistering from tape  Lab Results:   Basename 12/12/11 0400  WBC 8.9  HGB 9.6*  HCT 30.3*  PLT 235   BMET  Basename 12/12/11 0400  NA 138  K 4.1  CL 96  CO2 36*  GLUCOSE 86  BUN 24*  CREATININE 1.14*  CALCIUM 9.7   PT/INR No results found for this basename: LABPROT:2,INR:2 in the last 72 hours CMP     Component Value Date/Time   NA 138 12/12/2011 0400   K 4.1 12/12/2011 0400   CL 96 12/12/2011 0400   CO2 36* 12/12/2011 0400   GLUCOSE 86 12/12/2011 0400   BUN 24* 12/12/2011 0400   CREATININE 1.14* 12/12/2011 0400   CALCIUM 9.7 12/12/2011 0400   PROT 5.2* 12/03/2011 0435   ALBUMIN 2.1* 12/03/2011 0435   AST 23 12/03/2011 0435   ALT 28 12/03/2011 0435   ALKPHOS 88 12/03/2011 0435   BILITOT 0.2* 12/03/2011 0435   GFRNONAA 46* 12/12/2011 0400   GFRAA 53* 12/12/2011 0400   Lipase  No results found for this basename: lipase       Studies/Results: No results found.  Anti-infectives: Anti-infectives    None       Assessment/Plan  1.  PEG tube dysfunction: at this time will leave PEG tube to gravity drainage and NOT USE it.  No changes from surgical standpoint today, will have them begin dressing changes daily on PEG tube site and use neosporin around irritated areas,     LOS: 10 days    Branston Halsted 12/12/2011

## 2011-12-14 NOTE — Progress Notes (Signed)
Patient ID: [name], female   DOB: 01/08/1934, 78 y.o.   MRN: [id number] Patient ID: [name], female   DOB: 12/02/1933, 78 y.o.   MRN: [id number]    Subjective: No complaints today,  Up eating tray, Denies abd pain, denies back pain  Objective: Vital signs in last 24 hours:      Intake/Output from previous day:   Intake/Output this shift:    PE: Abd: soft, mildly tender with deep palp, vac in place, ostomy with op and pink, PEG tube with min drainage to bag, some irritation around PEG tube but better with regular dressing changes.  Lab Results:   Basename 12/12/11 0400  WBC 8.9  HGB 9.6*  HCT 30.3*  PLT 235   BMET  Basename 12/12/11 0400  NA 138  K 4.1  CL 96  CO2 36*  GLUCOSE 86  BUN 24*  CREATININE 1.14*  CALCIUM 9.7   PT/INR No results found for this basename: LABPROT:2,INR:2 in the last 72 hours CMP     Component Value Date/Time   NA 138 12/12/2011 0400   K 4.1 12/12/2011 0400   CL 96 12/12/2011 0400   CO2 36* 12/12/2011 0400   GLUCOSE 86 12/12/2011 0400   BUN 24* 12/12/2011 0400   CREATININE 1.14* 12/12/2011 0400   CALCIUM 9.7 12/12/2011 0400   PROT 5.2* 12/03/2011 0435   ALBUMIN 2.1* 12/03/2011 0435   AST 23 12/03/2011 0435   ALT 28 12/03/2011 0435   ALKPHOS 88 12/03/2011 0435   BILITOT 0.2* 12/03/2011 0435   GFRNONAA 46* 12/12/2011 0400   GFRAA 53* 12/12/2011 0400   Lipase  No results found for this basename: lipase       Studies/Results: No results found.  Anti-infectives: Anti-infectives    None       Assessment/Plan  1.  PEG tube dysfunction: at this time will leave PEG tube to gravity drainage and NOT USE it.  No changes from surgical standpoint today,     LOS: 12 days    [name] 12/14/2011

## 2011-12-14 NOTE — Progress Notes (Signed)
Agree with above. [name]. [name], MD, [facility] Surgery  12/14/2011 4:42 PM

## 2011-12-16 NOTE — Progress Notes (Signed)
Agree with above. G-tube site OK.  Leave to drainage Will see on Monday.  Wilmon Arms. [name], MD, [facility] Surgery  12/16/2011 8:43 AM

## 2011-12-16 NOTE — Progress Notes (Signed)
Patient ID: [name], female   DOB: December 14, 1933, 78 y.o.   MRN: [id number]    Subjective: No complaints today,  Denies abd pain, denies back pain, tolerating diet well  Objective: Vital signs in last 24 hours:      Intake/Output from previous day:   Intake/Output this shift:    PE: Abd: soft, mildly tender with deep palp, vac in place, ostomy with op and pink, PEG tube with min drainage to bag, some irritation around PEG tube but better with regular dressing changes.  Lab Results:  No results found for this basename: WBC:2,HGB:2,HCT:2,PLT:2 in the last 72 hours BMET No results found for this basename: NA:2,K:2,CL:2,CO2:2,GLUCOSE:2,BUN:2,CREATININE:2,CALCIUM:2 in the last 72 hours PT/INR No results found for this basename: LABPROT:2,INR:2 in the last 72 hours CMP     Component Value Date/Time   NA 138 12/12/2011 0400   K 4.1 12/12/2011 0400   CL 96 12/12/2011 0400   CO2 36* 12/12/2011 0400   GLUCOSE 86 12/12/2011 0400   BUN 24* 12/12/2011 0400   CREATININE 1.14* 12/12/2011 0400   CALCIUM 9.7 12/12/2011 0400   PROT 5.2* 12/03/2011 0435   ALBUMIN 2.1* 12/03/2011 0435   AST 23 12/03/2011 0435   ALT 28 12/03/2011 0435   ALKPHOS 88 12/03/2011 0435   BILITOT 0.2* 12/03/2011 0435   GFRNONAA 46* 12/12/2011 0400   GFRAA 53* 12/12/2011 0400   Lipase  No results found for this basename: lipase       Studies/Results: No results found.  Anti-infectives: Anti-infectives    None       Assessment/Plan  1.  PEG tube dysfunction: at this time will leave PEG tube to gravity drainage and NOT USE it.  No changes from surgical standpoint today,     LOS: 14 days    Dominque Levandowski 12/16/2011

## 2011-12-19 LAB — CBC
MCH: 28.7 pg (ref 26.0–34.0)
MCHC: 31.8 g/dL (ref 30.0–36.0)
Platelets: 206 10*3/uL (ref 150–400)

## 2011-12-19 LAB — BASIC METABOLIC PANEL
BUN: 23 mg/dL (ref 6–23)
Calcium: 9.3 mg/dL (ref 8.4–10.5)
Creatinine, Ser: 1.13 mg/dL — ABNORMAL HIGH (ref 0.50–1.10)
GFR calc non Af Amer: 46 mL/min — ABNORMAL LOW (ref >90)
Glucose, Bld: 80 mg/dL (ref 70–99)

## 2011-12-20 NOTE — Progress Notes (Signed)
Patient ID: [name], female   DOB: July 25, 1933, 78 y.o.   MRN: [id number] Patient ID: [name], female   DOB: July 05, 1933, 78 y.o.   MRN: [id number]    Subjective: No complaints today,  Denies abd pain, denies back pain, tolerating diet well  Objective: Vital signs in last 24 hours:      Intake/Output from previous day:   Intake/Output this shift:    PE: Abd: soft, non tender, vac in place, ostomy with op and pink, PEG tube with min drainage to bag, some irritation around PEG tube but still improving.  Lab Results:   Basename 12/19/11 0409  WBC 9.2  HGB 9.6*  HCT 30.2*  PLT 206   BMET  Basename 12/19/11 0409  NA 135  K 3.9  CL 96  CO2 33*  GLUCOSE 80  BUN 23  CREATININE 1.13*  CALCIUM 9.3   PT/INR No results found for this basename: LABPROT:2,INR:2 in the last 72 hours CMP     Component Value Date/Time   NA 135 12/19/2011 0409   K 3.9 12/19/2011 0409   CL 96 12/19/2011 0409   CO2 33* 12/19/2011 0409   GLUCOSE 80 12/19/2011 0409   BUN 23 12/19/2011 0409   CREATININE 1.13* 12/19/2011 0409   CALCIUM 9.3 12/19/2011 0409   PROT 5.2* 12/03/2011 0435   ALBUMIN 2.1* 12/03/2011 0435   AST 23 12/03/2011 0435   ALT 28 12/03/2011 0435   ALKPHOS 88 12/03/2011 0435   BILITOT 0.2* 12/03/2011 0435   GFRNONAA 46* 12/19/2011 0409   GFRAA 54* 12/19/2011 0409   Lipase  No results found for this basename: lipase       Studies/Results: No results found.  Anti-infectives: Anti-infectives    None       Assessment/Plan  1.  PEG tube dysfunction: at this time will leave PEG tube to gravity drainage and NOT USE it.  No changes from surgical standpoint today, plan considering removing in 6-8 weeks.    LOS: 18 days    [name] 12/20/2011

## 2011-12-26 ENCOUNTER — Telehealth (INDEPENDENT_AMBULATORY_CARE_PROVIDER_SITE_OTHER): Payer: Self-pay | Admitting: General Surgery

## 2011-12-26 NOTE — Telephone Encounter (Signed)
Patient spouse called in stating that his wife is in [facility] nursing facility and that her peg site is infected. He stated she has a wound vac and that the "PA" applied a topical ointment to the site but that it was infected at that time. He stated she would have to be brought to our facility by ambulance. I advised that her doctor and/or nurse at the facility has to contact our office and advise of her current condition, send [place] progress notes as well before an appointment can be made. Also advised that the nursing facility is responsible for patient transport.  Patient spouse was under the impression that because he is a doctor that he can direct her care and talk to Dr. Ezzard Standing or Dr. Biagio Quint directly to have her wound cared for. Advised that is the responsibility of the nursing facility.

## 2011-12-29 ENCOUNTER — Ambulatory Visit (HOSPITAL_COMMUNITY)
Admission: RE | Admit: 2011-12-29 | Discharge: 2011-12-29 | Disposition: A | Discharge status: Home / Self Care | Payer: Medicare Other | Source: Ambulatory Visit | Attending: General Surgery | Admitting: General Surgery

## 2011-12-29 ENCOUNTER — Encounter (INDEPENDENT_AMBULATORY_CARE_PROVIDER_SITE_OTHER): Payer: Self-pay | Admitting: General Surgery

## 2011-12-29 ENCOUNTER — Ambulatory Visit (INDEPENDENT_AMBULATORY_CARE_PROVIDER_SITE_OTHER): Payer: Medicare Other | Admitting: General Surgery

## 2011-12-29 VITALS — BP 98/68 | HR 68 | Temp 97.3°F | Resp 16

## 2011-12-29 DIAGNOSIS — Z4889 Encounter for other specified surgical aftercare: Secondary | ICD-10-CM

## 2011-12-29 DIAGNOSIS — Z5189 Encounter for other specified aftercare: Secondary | ICD-10-CM

## 2011-12-29 DIAGNOSIS — Z431 Encounter for attention to gastrostomy: Secondary | ICD-10-CM | POA: Insufficient documentation

## 2011-12-29 MED ORDER — IOHEXOL 300 MG/ML  SOLN
50.0000 mL | Freq: Once | INTRAMUSCULAR | Status: AC | PRN
  Administered 2011-12-29: 20 mL

## 2011-12-29 NOTE — Procedures (Signed)
Contrast injection of gastrostomy tube confirms malpositioning of the tip deep to the posterior wall of the stomach as was demonstrates on prior abdominal CT.  Successful bedside removal of the gastrostomy tube.  Patient tolerated procedure well without immediate post procedural complication.

## 2011-12-29 NOTE — Progress Notes (Signed)
Subjective:     Patient ID: [name], female   DOB: 24-Oct-1933, 78 y.o.   MRN: [id number]  HPI This patient follows up proximally 5-1/2 weeks status post [facility] colectomy for perforated diverticulitis. Approximately one week after surgery she had a wound dehiscence and she was taken back for nominal closure and wound VAC placement and at that time she had a an open gastrostomy tube placed. She is not use the tube since placement and she reports that she is eating fine.  She had a CT scan which showed concern for a possible extraluminal gastrostomy tube. It has not been draining anything and the only time that she has had pain is when they attempted to use it immedially after placement. She is currently on antibiotics for concerns of some erythema around the tube. She does have some local redness around her tubes and some drainage and some tenderness focally around her tube. Otherwise her ostomy is working fine and her wound seems to be healing well with the wound VAC in place.  Review of Systems     Objective:   Physical Exam She has a left upper quadrant gastrostomy tube with some excoriation on the skin and some irritation from the suture but no evidence of cellulitis or abscess. There is no significant drainage from the tube and I did not flush the tube today. Her ostomy is functioning well without apparent complications. Her midline wound has a wound VAC in place.    Assessment:     Status post Hartmann colectomy for perforated diverticulitis in wound dehiscence postoperatively. She's history couldn't be recovering okay from her procedure although her rehabilitation is still fairly slow. She is interested in tube removal and we would like to remove this tube since she is not using it and it seems to be irritating the skin although I do not see any sign of infection. The concern is that she is not quite 6 weeks out from her surgery and given her steroid use of, if this is intraluminal it would  certainly be better to give it a couple more weeks to allow this to heal prior to removal. We will check a tube study today and if this is extraluminal and we will go ahead and pull this and if it is intraluminal and we will allow another 2 weeks or so for this to heal prior to removal.    Plan:     We will go ahead and get a tube study and if her tube is extraluminal, we will go ahead and pull this. If it is intraluminal we will go ahead and leave this for another 2 or 3 weeks and we will see her back for removal at that time and we will recheck her wound as well.

## 2012-01-05 ENCOUNTER — Ambulatory Visit: Payer: Medicare Other | Admitting: Emergency Medicine

## 2012-01-13 ENCOUNTER — Ambulatory Visit (INDEPENDENT_AMBULATORY_CARE_PROVIDER_SITE_OTHER): Payer: Medicare Other | Admitting: General Surgery

## 2012-01-13 ENCOUNTER — Encounter (INDEPENDENT_AMBULATORY_CARE_PROVIDER_SITE_OTHER): Payer: Self-pay | Admitting: General Surgery

## 2012-01-13 VITALS — BP 128/78 | HR 70 | Temp 96.9°F | Resp 16 | Ht 64.0 in | Wt 161.2 lb

## 2012-01-13 DIAGNOSIS — Z4889 Encounter for other specified surgical aftercare: Secondary | ICD-10-CM

## 2012-01-13 DIAGNOSIS — Z5189 Encounter for other specified aftercare: Secondary | ICD-10-CM

## 2012-01-13 NOTE — Progress Notes (Signed)
Subjective:     Patient ID: [name], female   DOB: April 09, 1934, 78 y.o.   MRN: [id number]  HPI This patient follows up status post [facility] colectomy for perforated diverticulitis. Last time I saw her we set her up for a tube study for removal of her gastrostomy tube and this was removed because it of its extraluminal placement. She has not had any problems from this and she is doing well and maintaining her diet and her weight. She has a wound VAC in place on her abdomen for wound care. She is doing physical therapy and has been strengthening and improving with her mobility and she is scheduled to go home from the skilled nursing facility next week.  Review of Systems     Objective:   Physical Exam Her abdomen is soft and nontender on exam her incision is healing nicely with healthy granulation tissue. She had two 1 cm patches of necrotic tissue at the upper portion of her wound and this was debrided . Her wound is healing nicely and is very shallow. This should start to epithelialize soon. This may be too shallow for the wound VAC and wet to dry dressings may be needed.    Assessment:     Status post Hartmann colectomy and wound dehiscence She seems to be improving. Her wound is healing nicely and is clean. I did debris some of the tissue at the bedside and she can continue current care with the wound VAC or she can transition to wet to dry dressings. We are waiting for the wound to epithelialize. This may need a skin graft or consult from the wound care center if this does not heal. She is doing her physical therapy and improving with this she should continue to work with PT for strengthening. I think that it's okay to remove her PICC line.     Plan:     continue current wound care and physical therapy and I will see her back in about one month

## 2012-01-23 ENCOUNTER — Emergency Department (HOSPITAL_COMMUNITY)
Admission: EM | Admit: 2012-01-23 | Discharge: 2012-01-23 | Disposition: A | Discharge status: Home / Self Care | Payer: Medicare Other | Attending: Emergency Medicine | Admitting: Emergency Medicine

## 2012-01-23 ENCOUNTER — Encounter (HOSPITAL_COMMUNITY): Payer: Self-pay | Admitting: Family Medicine

## 2012-01-23 DIAGNOSIS — Z79899 Other long term (current) drug therapy: Secondary | ICD-10-CM | POA: Insufficient documentation

## 2012-01-23 DIAGNOSIS — T8131XA Disruption of external operation (surgical) wound, not elsewhere classified, initial encounter: Secondary | ICD-10-CM | POA: Insufficient documentation

## 2012-01-23 DIAGNOSIS — E039 Hypothyroidism, unspecified: Secondary | ICD-10-CM | POA: Insufficient documentation

## 2012-01-23 DIAGNOSIS — Z933 Colostomy status: Secondary | ICD-10-CM | POA: Insufficient documentation

## 2012-01-23 DIAGNOSIS — I10 Essential (primary) hypertension: Secondary | ICD-10-CM | POA: Insufficient documentation

## 2012-01-23 DIAGNOSIS — T8130XA Disruption of wound, unspecified, initial encounter: Secondary | ICD-10-CM

## 2012-01-23 DIAGNOSIS — Y838 Other surgical procedures as the cause of abnormal reaction of the patient, or of later complication, without mention of misadventure at the time of the procedure: Secondary | ICD-10-CM | POA: Insufficient documentation

## 2012-01-23 MED ORDER — IPRATROPIUM BROMIDE 0.02 % IN SOLN
0.5000 mg | Freq: Once | RESPIRATORY_TRACT | Status: AC
  Administered 2012-01-23: 0.5 mg via RESPIRATORY_TRACT
  Filled 2012-01-23: qty 2.5

## 2012-01-23 MED ORDER — ALBUTEROL SULFATE (5 MG/ML) 0.5% IN NEBU
2.5000 mg | INHALATION_SOLUTION | Freq: Once | RESPIRATORY_TRACT | Status: AC
  Administered 2012-01-23: 2.5 mg via RESPIRATORY_TRACT
  Filled 2012-01-23: qty 0.5

## 2012-01-23 MED ORDER — ALBUTEROL SULFATE (5 MG/ML) 0.5% IN NEBU
INHALATION_SOLUTION | RESPIRATORY_TRACT | Status: AC
  Administered 2012-01-23: 20:00:00
  Filled 2012-01-23: qty 1

## 2012-01-23 NOTE — ED Notes (Signed)
Pt reports need to colostomy care.  Pt and family is ok to wait to change colostomy at home. Will continue to evaluate.

## 2012-01-23 NOTE — ED Notes (Signed)
Respiratory made aware of breathing tx

## 2012-01-23 NOTE — ED Notes (Signed)
MD at bedside. 

## 2012-01-23 NOTE — ED Notes (Signed)
EAV:[id number]> Expected date:01/23/12<BR> Expected time: 3:41 PM<BR> Means of arrival:Ambulance<BR> Comments:<BR> 78yoM, n/v

## 2012-01-23 NOTE — ED Notes (Signed)
Per EMS: Pt from [facility]. Reports wound vac to abdomen stopped working and is now leaking.  Pt has hx of asthma with mild wheezing and requested albuterol from EMS. Pt given 5 mg albuterol pta.

## 2012-01-23 NOTE — ED Provider Notes (Signed)
History     CSN: [id number]  Arrival date & time 01/23/12  1604   First MD Initiated Contact with Patient 01/23/12 1656      Chief Complaint  Patient presents with  . Wound Check    (Consider location/radiation/quality/duration/timing/severity/associated sxs/prior treatment) Patient is a 79 y.o. female presenting with wound check. The history is provided by the patient.  Wound Check   She had surgery for perforated diverticulitis with a colostomy having been done. The postoperative course was complicated by wound to his symptoms. She has a drain in place with a pump. 2 days ago, the pump stopped working. Today, and she has noticed that she is having significant drainage from the wound. She denies fever or chills and denies abdominal pain. She did vomit several times a day but denies nausea.  Past Medical History  Diagnosis Date  . Insomnia, unspecified   . Osteoarthrosis, unspecified whether generalized or localized, unspecified site   . Unspecified hypothyroidism   . Unspecified essential hypertension   . Atrial fibrillation   . Sciatica   . Anemia   . COPD (chronic obstructive pulmonary disease)   . UTI (urinary tract infection)   . GERD (gastroesophageal reflux disease)   . Pneumonia   . Asthma   . Clostridium difficile colitis   . Diverticulitis of sigmoid colon 11/2010    with abscess/contained perforation  . Dementia     mild  . Dehydration with hyponatremia 12/2010    hospitalized  . Angiodysplasia of stomach     ablated 2010  . Angiodysplasia of colon     ablated 2010  . Vitamin B12 deficiency (dietary) anemia 04/12/2011  . Shingles   . Sarcoma     right hand  . Malignant neoplasm of breast (female), unspecified site     left  . Dupuytren's disease     left hand    Past Surgical History  Procedure Date  . Nasal sinus surgery     x 3  . Appendectomy   . Hernia repair   . Abdominal hysterectomy   . Pacemaker insertion   . Eye surgery     x6-bilateral   . Finger amputation     right  . Foot surgery     right toes x 3  . Tonsillectomy   . Colonoscopy 01/2009    w/ablation of 3 mm AVM, diverticulosis, external hemorrhoids  . Upper gastrointestinal endoscopy 01/2009    w/polypectomy, AVM, hyperplastic polyp  . Abdominal hysterectomy   . Laparotomy 11/12/2011    Procedure: EXPLORATORY LAPAROTOMY;  Surgeon: Lodema Pilot, DO;  Location: [facility];  Service: General;  Laterality: N/A;  . Colostomy revision 11/12/2011    Procedure: COLON RESECTION SIGMOID;  Surgeon: Lodema Pilot, DO;  Location: [facility];  Service: General;  Laterality: N/A;  . Colostomy 11/12/2011    Procedure: COLOSTOMY;  Surgeon: Lodema Pilot, DO;  Location: [facility];  Service: General;  Laterality: Left;  End colostomy  . Laparotomy 11/19/2011    Procedure: EXPLORATORY LAPAROTOMY;  Surgeon: Kandis Cocking, MD;  Location: [facility];  Service: General;  Laterality: N/A;  . Gastrostomy 11/19/2011    Procedure: GASTROSTOMY;  Surgeon: Kandis Cocking, MD;  Location: [facility];  Service: General;;  INSERTION GASTROSTOMY TUBE  . Application of wound vac 11/19/2011    Procedure: APPLICATION OF WOUND VAC;  Surgeon: Kandis Cocking, MD;  Location: [facility];  Service: General;  Laterality: N/A;    Family History  Problem Relation Age of  Onset  . Emphysema Paternal Uncle   . Heart disease Mother   . Colon cancer Mother   . Pancreatic cancer Father   . Cancer Father 6    pancreatic cancer  . Tuberculosis Paternal Uncle   . Breast cancer Maternal Aunt   . Breast cancer Cousin   . Prostate cancer Cousin   . Colon cancer Cousin   . Uterine cancer Cousin     History  Substance Use Topics  . Smoking status: Former Smoker -- 0.5 packs/day for 21 years    Quit date: 06/20/1977  . Smokeless tobacco: Never Used  . Alcohol Use: Yes     1 glass wine/night    OB History    Grav Para Term Preterm Abortions TAB SAB Ect Mult Living                  Review of Systems  All other systems reviewed and  are negative.    Allergies  Aspirin; Ibuprofen; Iron dextran; Metoclopramide hcl; and Sulfonamide derivatives  Home Medications   Current Outpatient Rx  Name Route Sig Dispense Refill  . ACETAMINOPHEN 325 MG PO TABS Oral Take 650 mg by mouth every 6 (six) hours as needed.    . ALPRAZOLAM 0.25 MG PO TABS Oral Take 0.25 mg by mouth at bedtime as needed.    . AZELASTINE HCL 137 MCG/SPRAY NA SOLN Nasal Place 2 sprays into the nose at bedtime. Use in each nostril as directed    . CARVEDILOL 3.125 MG PO TABS Oral Take 3.125 mg by mouth 2 (two) times daily with a meal.    . DIPHENHYDRAMINE HCL 25 MG PO CAPS Oral Take 25 mg by mouth at bedtime as needed.    . ENSURE PLUS PO LIQD Oral Take 237 mLs by mouth 2 (two) times daily between meals. Prefers chocolate    . FENTANYL 25 MCG/HR TD PT72 Transdermal Place 1 patch onto the skin every 3 (three) days.    [name] FLUTICASONE-SALMETEROL 500-50 MCG/DOSE IN AEPB Inhalation Inhale 1 puff into the lungs every 12 (twelve) hours.    . FUROSEMIDE 20 MG PO TABS Oral Take 20 mg by mouth daily as needed. For fluid retention.    . GUAIFENESIN ER 600 MG PO TB12 Oral Take 600 mg by mouth 2 (two) times daily.    . IPRATROPIUM-ALBUTEROL 0.5-2.5 (3) MG/3ML IN SOLN Nebulization Take 3 mLs by nebulization 2 (two) times daily as needed.    [name] LEVOTHYROXINE SODIUM 50 MCG PO TABS Oral Take 50 mcg by mouth daily before breakfast.    . LOSARTAN POTASSIUM 50 MG PO TABS Oral Take 50 mg by mouth daily.    [name] MAGNESIUM OXIDE 400 MG PO TABS Oral Take 400 mg by mouth daily.    [name] METOPROLOL TARTRATE 25 MG PO TABS Oral Take 25 mg by mouth 2 (two) times daily.    [name] MIRTAZAPINE 30 MG PO TABS Oral Take 30 mg by mouth at bedtime.    . ADULT MULTIVITAMIN W/MINERALS CH Oral Take 1 tablet by mouth daily.    [name] JUVEN PO PACK Oral Take 1 packet by mouth 2 (two) times daily between meals.    . OXYCODONE HCL 5 MG PO TABS Oral Take 5 mg by mouth every 4 (four) hours as needed.    [name] PANTOPRAZOLE  SODIUM 40 MG PO TBEC Oral Take 40 mg by mouth daily.    [name] POLYETHYLENE GLYCOL 3350 PO PACK Oral Take 17 g by mouth  daily.    [name] PRAVASTATIN SODIUM 40 MG PO TABS Oral Take 40 mg by mouth daily.    [name] PREDNISONE 5 MG PO TABS Oral Take 7.5 mg by mouth daily.     [name] PROMETHAZINE HCL 25 MG PO TABS Oral Take 25 mg by mouth every 6 (six) hours as needed.    [name] VITAMIN D (ERGOCALCIFEROL) 50000 UNITS PO CAPS Oral Take 50,000 Units by mouth every 7 (seven) days. On Sundays    . ZAFIRLUKAST 20 MG PO TABS Oral Take 20 mg by mouth 2 (two) times daily.    [name] ZOLPIDEM TARTRATE 10 MG PO TABS Oral Take 10 mg by mouth at bedtime as needed.      BP 127/62  Pulse 105  Temp 100 F (37.8 C) (Oral)  Resp 20  SpO2 97%  Physical Exam  Nursing note and vitals reviewed. 78year old female, resting comfortably and in no acute distress. Vital signs are significant for mild tachycardia with heart rate of 105. Oxygen saturation is 97%, which is normal. Head is normocephalic and atraumatic. PERRLA, EOMI. Oropharynx is clear. Neck is nontender and supple without adenopathy or JVD. Back is nontender and there is no CVA tenderness. Lungs are clear without rales, wheezes, or rhonchi. Chest is nontender. Heart has regular rate and rhythm without murmur. Abdomen is soft, flat, nontender without masses or hepatosplenomegaly and peristalsis is normoactive. Wound drain hardware is present on midline lower abdominal incision. This is not removed. Colostomy is present in the left upper quadrant and is draining appropriately. Extremities have no cyanosis or edema, full range of motion is present. Skin is warm and dry without rash. Neurologic: Mental status is normal, cranial nerves are intact, there are no motor or sensory deficits.   ED Course  Procedures (including critical care time)    1. Wound dehiscence       MDM  Failure of wound drainage system. She shows no evidence of infection. Consultation will be obtained with  general surgery for evaluation of her wound drainage system. Prior records are reviewed, and she had a Hartmann hemicolectomy for perforated sigmoid diverticulitis.   Consultations been obtained with Dr. Suzzanne Cloud who has come in and removed her drain and applied a wet-to-dry dressing. Patient developed acute bronchospasm which was treated with an albuterol nebulizer treatment and she is now stable for discharge. She is to followup with her surgeon.     Dione Booze, MD 01/23/12 [phone]

## 2012-01-23 NOTE — Consult Note (Signed)
  I was asked to see her by Dr. Preston Fleeting because of problems with the [facility] for her open wound. She had an exploratory laparotomy for perforated diverticulitis 10/2011 and had wound complications.  She has a VAC on the wound has been leaking from the inferior aspect. She went home from rehabilitation on Friday and began having problems with the VAC over the weekend. The home health nurses did not come out to change the [facility] today. She presented to the emergency department because of problems with the [facility] leaking.  The biggest complaint now however is some shortness of breath related to her asthma.  Physical exam: Abdomen-there is a colostomy draining brown stool in the left abdomen. A VAC is present and the dressing is loose inferiorly allowing for leak of fluid. I removed the VAC and there is a superficial wound with granulation tissue present. I applied a normal saline damp to dry dressing to this.  Assessment: Leaking from inferior aspect of the VAC dressing.  I have removed the VAC and applied a damp to dry dressing.  Plan: We'll call [facility] for home health tomorrow and see taking go out and place a VAC. If not we can always do damp to dry dressing changes. From my standpoint, she can be discharged from the emergency room. I did talk with the emergency department physician about her asthma issues.

## 2012-01-24 ENCOUNTER — Telehealth (INDEPENDENT_AMBULATORY_CARE_PROVIDER_SITE_OTHER): Payer: Self-pay

## 2012-01-24 NOTE — Telephone Encounter (Signed)
Follow up appointment scheduled for 02/03/12 @ 11:55 am w/Dr. Biagio Quint.

## 2012-01-24 NOTE — Telephone Encounter (Signed)
Spoke with intake nurse at [place] this morning.  They were unable to contact the pt yesterday, but will go out today to put on another VAC.  F/u with Dr. Biagio Quint will be scheduled for next week.  Dr. Delice Lesch nurse is aware.

## 2012-02-03 ENCOUNTER — Ambulatory Visit (INDEPENDENT_AMBULATORY_CARE_PROVIDER_SITE_OTHER): Payer: Medicare Other | Admitting: General Surgery

## 2012-02-03 ENCOUNTER — Encounter (INDEPENDENT_AMBULATORY_CARE_PROVIDER_SITE_OTHER): Payer: Self-pay | Admitting: General Surgery

## 2012-02-03 VITALS — BP 114/78 | HR 92 | Temp 97.6°F | Resp 20 | Ht 64.0 in | Wt 158.6 lb

## 2012-02-03 DIAGNOSIS — Z5189 Encounter for other specified aftercare: Secondary | ICD-10-CM

## 2012-02-03 DIAGNOSIS — Z4889 Encounter for other specified surgical aftercare: Secondary | ICD-10-CM

## 2012-02-03 NOTE — Patient Instructions (Addendum)
Change dressing at least once daily.

## 2012-02-03 NOTE — Progress Notes (Signed)
Subjective:     Patient ID: [name], female   DOB: [birth year], 78 y.o.   MRN: [id number]  HPI This patient follows up status post [facility] colectomy for perforated diverticulitis. She had a wound dehiscence and and has been dealing with a chronic wound center procedure. She is still steroid-dependent for her COPD and asthma and at this slow time healing. She no longer is doing wound care with the wound VAC but is doing hydrogel and twice daily dressing changes. She has no issues with her ostomy and this seems to be functioning well. Her main complaint is tenderness in her skini from the tape and dressing changes. Review of Systems     Objective:   Physical Exam Her wound seems to be well healed and is now very shallow. It is healed up to essentially skin level. It is still fairly wide of the 6-7 cm wide with pink and healthy granulation tissue.    Assessment:     Status post sigmoid colectomy for perforated diverticulitis and wound dehiscence Her wound is healing very well. She has a very shallow wound and at this point really just need continued wound care or consideration for possible skin graft. I recommended that she just this daily and recommended that she be evaluated at the wound care center for consideration for other wound care options such as skin grafting.    Plan:     Referral to the wound care clinic I will see her back in about one month otherwise.

## 2012-02-06 ENCOUNTER — Telehealth (INDEPENDENT_AMBULATORY_CARE_PROVIDER_SITE_OTHER): Payer: Self-pay | Admitting: General Surgery

## 2012-02-06 ENCOUNTER — Ambulatory Visit: Payer: Self-pay | Admitting: Cardiovascular Disease

## 2012-02-06 ENCOUNTER — Telehealth (INDEPENDENT_AMBULATORY_CARE_PROVIDER_SITE_OTHER): Payer: Self-pay

## 2012-02-06 DIAGNOSIS — I4891 Unspecified atrial fibrillation: Secondary | ICD-10-CM

## 2012-02-06 NOTE — Telephone Encounter (Signed)
[name] called to get a diagnosis code for the patient's colostomy supplies.  She has Medicare.  I checked with our coders and got a code for perforated diverticulitis which is 562.11.  That is why the pt had a sigmoid colectomy with end colostomy.  There was only a cpt code for sigmoid colectomy.

## 2012-02-06 NOTE — Telephone Encounter (Signed)
Spoke with pt's husband and informed him that his wife's appt with Dr. Kelly Splinter at the wound clinic will be 9/9 at 12:45.  They were fine with this.

## 2012-02-07 ENCOUNTER — Encounter: Payer: Self-pay | Admitting: Emergency Medicine

## 2012-02-07 ENCOUNTER — Ambulatory Visit (INDEPENDENT_AMBULATORY_CARE_PROVIDER_SITE_OTHER): Payer: Medicare Other | Admitting: Emergency Medicine

## 2012-02-07 DIAGNOSIS — R05 Cough: Secondary | ICD-10-CM

## 2012-02-07 DIAGNOSIS — J45909 Unspecified asthma, uncomplicated: Secondary | ICD-10-CM

## 2012-02-07 NOTE — Patient Instructions (Signed)
Please stop your DuoNebs Restart your QVAR 2 puffs twice a day Take albuterol 2 puffs up to every 4 hours if needed for shortness of breath.  Arrange follow up with Dr Lucie Leather to restart your Xolair Continue your prednisone 7.5mg  daily Continue astelin nasal spray, 2 sprays each nostril twice a day Restart your rhinocort nasal spray Follow with Dr Delton Coombes in 4 months or sooner if you have any problems.

## 2012-02-07 NOTE — Assessment & Plan Note (Signed)
astelin spray may be helping, but she wants to add back rhinocort, needs [place] to call in the astelin.

## 2012-02-07 NOTE — Progress Notes (Signed)
Subjective:    Patient ID: [name], female    DOB: 06-27-1933, 78 y.o.   MRN: [id number] HPI 78 yo woman, former smoker, carries hx of asthma dx in 1989 at [facility]. In retrospect probably had childhood asthma as well. Also with allergies, reported VCD (with speech rx in the past). She has been treated with bronchodilators, ICS, systemic steroids x 27 years (c/b adrenal insuff when she has stopped), on Xolair per Dr Lucie Leather - feels better on the xolair. Not on immunotherapy, has been in distant past. Currently on Spiriva + QVAR. 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 Seen at [facility] April '11 for her cough. CT scan perfomed there to eval bronchiectasis. Then referred to speech therapy [facility] , received botox UA injection with improvement in symptoms.  Has been rx agressively for GERD. She keeps a hoarse voice, a chronic cough. Presents today regarding the cough.   ROV 05/04/10 -- Hx VCD, asthma, allergies. She had Botox injections 6 weeks ago, may have been beneficial but it causes some swallowing difficulties. Still w cough, voice hoarse. Her nasal drainage has worsened since last visit, as has the cough. Started the loratadine last time, is on rhinocort, never started the NSWs, taking pantoprazole. Was never referred to speech therapy. We stopped Spiriva and she doesn't miss it (but cough remained).   ROV 12/29/10 -- returns for VCD, asthma, allergies. Tells me that she was admitted last November for ? Bronchitis, treated with abx and steroids. Her baseline pred dose is 7.5mg  qd. Stable xolair dosing w Dr Lucie Leather. Takes QVAR bid. Tells me that she has ben evaluated by Dr Felipa Eth and Dr Leone Payor for abd pain, ? Diverticular dz - course then c/b C. Diff colitis. CT scan showed a small abscess assoc with diverticular dz - currently on vanco PO + cipro/flagyl.   Acute OV  11/04/11 -  Complains of increased SOB, prod cough with clear mucus primarily at night x2months  - finished zithromycin and cipro for diverticulitis 3 days ago. Not using anything for cough. Some post nasal drainage., no overt reflux, no fever or discolored mucus.  On Prevacid daily  And zantac At bedtime   Has hx of C. Diff.  Currently on Xolair but missed last month due to family illness.  Prednisone dose is 7.5mg   Not taking Spiriva any longer  Remains on QVAR Twice daily   No longer on Botox injection at voice center.  Has a lot of post nasal drip esp at night.  No fever , hemoptysis or edema.   ROV 02/07/12 -- returns for VCD, asthma, allergies.  She is s/p surgery for diverticular abscess, complicated by dehiscence and repeat surgery. She was at [facility] and then [facility]. Her pred was temporarily increased. Hasn't had QVAR since May. Her Rhinocort was changed to Astelin. She is still on Advair. Prevacid changed to Protonix. No Xolair since march, or botox any time recently, she believes she misses the xolair.      Objective:   Physical Exam There were no vitals filed for this visit.  Gen: Pleasant,   in no distress,  normal affect  ENT: No lesions,  mouth clear,  oropharynx clear, no postnasal drip  Neck: No JVD, no TMG, no carotid bruits  Lungs: coarse BS   Cardiovascular: RRR, heart sounds normal, no murmur or gallops, no peripheral edema  Musculoskeletal: No deformities, no cyanosis or clubbing  Neuro: alert, non focal  Skin: Warm, no lesions or rashes       Assessment & Plan:  ASTHMA - will change duonebs back to qvar + albuterol - she will contact Dr Lucie Leather about restarting xolair - accolate  Cough astelin spray may be helping, but she wants to add back rhinocort, needs [place] to call in the astelin.

## 2012-02-07 NOTE — Assessment & Plan Note (Signed)
-   will change duonebs back to qvar + albuterol - she will contact Dr Lucie Leather about restarting xolair - accolate

## 2012-02-09 ENCOUNTER — Telehealth (INDEPENDENT_AMBULATORY_CARE_PROVIDER_SITE_OTHER): Payer: Self-pay | Admitting: General Surgery

## 2012-02-09 NOTE — Telephone Encounter (Signed)
Pt's husband reports his wife is home now from the nursing home and he is doing her dressing changes.  He cannot locate (plain) hydrogel, only hydrogel with silver.  They have been referred to the wound care center but don't have appt for another 2 weeks.  Paged and updated Dr. Biagio Quint; said okay to use wet-to-dry dressings until then.  Husband understands.

## 2012-02-23 ENCOUNTER — Telehealth (INDEPENDENT_AMBULATORY_CARE_PROVIDER_SITE_OTHER): Payer: Self-pay

## 2012-02-23 NOTE — Telephone Encounter (Signed)
Patient calling into office to report nausea after taking her am medications, patient reports she's having severe nausea and a warm sensation come across her abdomen with all bowel movements.  She was taking Juven Pack for Nutritional Supplement and would like to know if she should continue taking , she continues to regain some of her appetite and eat as much as she can.

## 2012-02-27 ENCOUNTER — Encounter (HOSPITAL_BASED_OUTPATIENT_CLINIC_OR_DEPARTMENT_OTHER): Payer: Medicare Other | Attending: Plastic Surgery

## 2012-02-27 DIAGNOSIS — I10 Essential (primary) hypertension: Secondary | ICD-10-CM | POA: Insufficient documentation

## 2012-02-27 DIAGNOSIS — Z9221 Personal history of antineoplastic chemotherapy: Secondary | ICD-10-CM | POA: Insufficient documentation

## 2012-02-27 DIAGNOSIS — L98499 Non-pressure chronic ulcer of skin of other sites with unspecified severity: Secondary | ICD-10-CM | POA: Insufficient documentation

## 2012-02-27 DIAGNOSIS — Z923 Personal history of irradiation: Secondary | ICD-10-CM | POA: Insufficient documentation

## 2012-02-27 DIAGNOSIS — E039 Hypothyroidism, unspecified: Secondary | ICD-10-CM | POA: Insufficient documentation

## 2012-02-27 DIAGNOSIS — J4489 Other specified chronic obstructive pulmonary disease: Secondary | ICD-10-CM | POA: Insufficient documentation

## 2012-02-27 DIAGNOSIS — Z853 Personal history of malignant neoplasm of breast: Secondary | ICD-10-CM | POA: Insufficient documentation

## 2012-02-27 DIAGNOSIS — J449 Chronic obstructive pulmonary disease, unspecified: Secondary | ICD-10-CM | POA: Insufficient documentation

## 2012-02-28 NOTE — Progress Notes (Signed)
[facility]  NAME:  [name], [name]                  ACCOUNT NO.:  [id number]  MEDICAL RECORD NO.:  [id number]      DATE OF BIRTH:  [birth year]  PHYSICIAN:  Wayland Denis, DO       VISIT DATE:  02/27/2012                                  OFFICE VISIT   The patient is here for an abdominal ulcer.  HISTORY OF PRESENT ILLNESS:  The patient is a 78 year old female, who is here for evaluation of her abdominal ulcer.  She had diverticulitis that ended up with needing emergent surgery.  She unfortunately dehisced a week later and ended up with a colectomy.  She has a colectomy on the left and she has the ulceration present in the midline area.  The wound size is 9.3 x 5.8 x 0.1.  It does appear to be clean.  No sign of infection.  Minimal drainage in the periwound area is intact.  It does not seem to have gotten better over the last couple of weeks and she is concerned about the healing process.  PAST MEDICAL HISTORY:  CHF, hypertension, hypothyroidism, colitis, asthma, chronic obstructive pulmonary disease, osteoarthritis, Raynaud syndrome, and a history of chemo and radiation for treatment of breast cancer.  She also has history of breast surgery.  ALLERGIES:  ASPIRIN, SULFA, and METOPROLOL.  MEDICATIONS:  Prednisone.  REVIEW OF SYSTEMS:  Otherwise negative.  FAMILY HISTORY:  Lives with her husband who is a former [facility] physician. She has also had some issues with speech and is being treated at [facility] for this with Botox injections.  PHYSICAL EXAMINATION:  GENERAL:  She is alert, oriented, cooperative, not in any acute distress.  She is pleasant. HEENT:  Pupils are equal.  Extraocular muscles are intact. NECK:  No cervical lymphadenopathy. LUNGS:  Breathing at this point unlabored.  She has been sitting in the chair for several minutes. HEART: Her heart rate is regular.  Her pulses are regular.  The wound is described above.  It does appear to be clean,  none infected.  She has good granulation tissue at the wound bed and she is showing signs of healing by epithelializing at the edges.  ASSESSMENT:  Abdominal ulcer.  PLAN:  For checking her pre-albumin to see what her nutritional status is.  Recommend vitamin C, zinc, multivitamin.  We will do Endoform this week and apply for Oasis.  Also we will increase her protein level, absolutely no smoking or exposure to tobacco and we may need to add vitamin A to counteract the effects of the prednisone.     Wayland Denis, DO     CS/MEDQ  D:  02/27/2012  T:  02/28/2012  Job:  [id number]

## 2012-03-06 NOTE — Progress Notes (Signed)
[facility]  NAME:  [name], [name]                  ACCOUNT NO.:  [id number]  MEDICAL RECORD NO.:  [id number]      DATE OF BIRTH:  April 11, 1934  PHYSICIAN:  Wayland Denis, DO       VISIT DATE:  03/05/2012                                  OFFICE VISIT   The patient is a 78 year old female who is here for followup on her abdominal ulcer.  She had Endoform placed last week.  She has actually done extremely well and the ulcer is epithelializing from the size. There has been no change in her medication or social history.  On exam, she is alert, oriented, and cooperative, not in any acute distress.  She is pleasant.  Pupils are equal.  Extraocular muscles are intact.  No cervical lymphadenopathy.  Her breathing is unlabored.  Her heart is regular.  Her abdomen is soft.  We will continue with Endoform and see her back in a week.     Wayland Denis, DO     CS/MEDQ  D:  03/05/2012  T:  03/06/2012  Job:  [id number]

## 2012-03-07 ENCOUNTER — Encounter: Payer: Self-pay | Admitting: *Deleted

## 2012-03-13 NOTE — Progress Notes (Signed)
[facility]  NAME:  JACQUALINE, WEICHEL                  ACCOUNT NO.:  [id number]  MEDICAL RECORD NO.:  [id number]      DATE OF BIRTH:  03/21/1934  PHYSICIAN:  Wayland Denis, DO       VISIT DATE:  03/12/2012                                  OFFICE VISIT   The patient is a 78 year old female who is here for followup on her abdominal ulcer.  She is healing and looking much better with granulation tissue that is nice and pink and epithelialization from the edges.  There has been no change in her medications or social history.  On exam, she is alert, oriented, cooperative, not in any acute distress. She is pleasant.  Pupils are equal.  Extraocular muscles are intact.  No cervical lymphadenopathy.  Her breathing is unlabored.  Her heart is regular.  Her abdomen is soft.  There is a marked improvement.  No sign of infection, a little bit of drainage, but not much.  We will continue with the Endoform and apply for Oasis and we will see her back in a week.     Wayland Denis, DO     CS/MEDQ  D:  03/12/2012  T:  03/13/2012  Job:  [id number]

## 2012-03-19 ENCOUNTER — Encounter: Payer: Self-pay | Admitting: Internal Medicine

## 2012-03-19 ENCOUNTER — Ambulatory Visit (INDEPENDENT_AMBULATORY_CARE_PROVIDER_SITE_OTHER): Payer: Medicare Other | Admitting: Internal Medicine

## 2012-03-19 VITALS — BP 110/66 | HR 67 | Ht 64.5 in | Wt 156.0 lb

## 2012-03-19 DIAGNOSIS — Z950 Presence of cardiac pacemaker: Secondary | ICD-10-CM

## 2012-03-19 DIAGNOSIS — I4891 Unspecified atrial fibrillation: Secondary | ICD-10-CM

## 2012-03-19 LAB — PACEMAKER DEVICE OBSERVATION
AL IMPEDENCE PM: 512 Ohm
ATRIAL PACING PM: 4
BAMS-0001: 175 {beats}/min
RV LEAD IMPEDENCE PM: 611 Ohm
VENTRICULAR PACING PM: 100

## 2012-03-19 NOTE — Progress Notes (Signed)
HPI [name] returns today for followup. She is a very pleasant 78 year old woman with complete heart block, COPD, status post pacemaker insertion, and hypertension. She has diastolic heart failure. The patient was hospitalized several weeks ago with abdominal hernia repair and ultimately had to have her wound packed and heal by secondary intention. She was several weeks in the hospital. She feels better. She denies fevers or chills. No chest pain or shortness of breath. No syncope. Allergies  Allergen Reactions  . Aspirin   . Ibuprofen   . Iron Dextran     REACTION: itching after test dose  . Metoclopramide Hcl     REACTION: chomping mouth  . Sulfonamide Derivatives      Current Outpatient Prescriptions  Medication Sig Dispense Refill  . acetaminophen (TYLENOL) 325 MG tablet Take 650 mg by mouth every 6 (six) hours as needed.      . ALPRAZolam (XANAX) 0.25 MG tablet Take 0.25 mg by mouth at bedtime as needed.      [name] azelastine (ASTELIN) 137 MCG/SPRAY nasal spray Place 2 sprays into the nose at bedtime. Use in each nostril as directed      . Beclomethasone Dipropionate (QVAR IN) Inhale 2 puffs into the lungs 2 (two) times daily.      . Budesonide (RHINOCORT AQUA NA) Place into the nose daily.      . carvedilol (COREG) 3.125 MG tablet Take 3.125 mg by mouth 2 (two) times daily with a meal.      . Casanthranol-Docusate Sodium 30-100 MG CAPS Take by mouth daily.      . diphenhydrAMINE (BENADRYL) 25 mg capsule Take 25 mg by mouth at bedtime as needed.      . furosemide (LASIX) 20 MG tablet Take 20 mg by mouth daily as needed. For fluid retention.      [name] guaiFENesin (MUCINEX) 600 MG 12 hr tablet Take 600 mg by mouth as needed.       [name] ipratropium-albuterol (DUONEB) 0.5-2.5 (3) MG/3ML SOLN Take 3 mLs by nebulization 2 (two) times daily as needed.      [name] levothyroxine (SYNTHROID, LEVOTHROID) 50 MCG tablet Take 50 mcg by mouth daily before breakfast.       . losartan (COZAAR) 50 MG tablet Take  50 mg by mouth daily.      . magnesium oxide (MAG-OX) 400 MG tablet Take 400 mg by mouth daily.      . metoprolol tartrate (LOPRESSOR) 25 MG tablet Take 25 mg by mouth 2 (two) times daily.      . mirtazapine (REMERON) 30 MG tablet Take 30 mg by mouth at bedtime.      . Multiple Vitamin (MULTIVITAMIN WITH MINERALS) TABS Take 1 tablet by mouth daily.      [name] oxyCODONE (OXY IR/ROXICODONE) 5 MG immediate release tablet Take 5 mg by mouth every 4 (four) hours as needed.      . pantoprazole (PROTONIX) 40 MG tablet Take 40 mg by mouth daily.      . pravastatin (PRAVACHOL) 40 MG tablet Take 40 mg by mouth daily.      . predniSONE (DELTASONE) 5 MG tablet Take 7.5 mg by mouth daily.       . promethazine (PHENERGAN) 25 MG tablet Take 25 mg by mouth every 6 (six) hours as needed.      . Vitamin D, Ergocalciferol, (DRISDOL) 50000 UNITS CAPS Take 50,000 Units by mouth every 7 (seven) days. On Sundays      . zafirlukast (ACCOLATE) 20  MG tablet Take 20 mg by mouth 2 (two) times daily.      [name] zolpidem (AMBIEN) 10 MG tablet Take 10 mg by mouth at bedtime as needed.         Past Medical History  Diagnosis Date  . Insomnia, unspecified   . Osteoarthrosis, unspecified whether generalized or localized, unspecified site   . Unspecified hypothyroidism   . Unspecified essential hypertension   . Atrial fibrillation   . Sciatica   . Anemia   . COPD (chronic obstructive pulmonary disease)   . UTI (urinary tract infection)   . GERD (gastroesophageal reflux disease)   . Pneumonia   . Asthma   . Clostridium difficile colitis   . Diverticulitis of sigmoid colon 11/2010    with abscess/contained perforation  . Dementia     mild  . Dehydration with hyponatremia 12/2010    hospitalized  . Angiodysplasia of stomach     ablated 2010  . Angiodysplasia of colon     ablated 2010  . Vitamin B12 deficiency (dietary) anemia 04/12/2011  . Shingles   . Sarcoma     right hand  . Malignant neoplasm of breast (female),  unspecified site     left  . Dupuytren's disease     left hand    ROS:   All systems reviewed and negative except as noted in the HPI.   Past Surgical History  Procedure Date  . Nasal sinus surgery     x 3  . Appendectomy   . Hernia repair   . Abdominal hysterectomy   . Pacemaker insertion   . Eye surgery     x6-bilateral  . Finger amputation     right  . Foot surgery     right toes x 3  . Tonsillectomy   . Colonoscopy 01/2009    w/ablation of 3 mm AVM, diverticulosis, external hemorrhoids  . Upper gastrointestinal endoscopy 01/2009    w/polypectomy, AVM, hyperplastic polyp  . Abdominal hysterectomy   . Laparotomy 11/12/2011    Procedure: EXPLORATORY LAPAROTOMY;  Surgeon: Lodema Pilot, DO;  Location: [facility];  Service: General;  Laterality: N/A;  . Colostomy revision 11/12/2011    Procedure: COLON RESECTION SIGMOID;  Surgeon: Lodema Pilot, DO;  Location: [facility];  Service: General;  Laterality: N/A;  . Colostomy 11/12/2011    Procedure: COLOSTOMY;  Surgeon: Lodema Pilot, DO;  Location: [facility];  Service: General;  Laterality: Left;  End colostomy  . Laparotomy 11/19/2011    Procedure: EXPLORATORY LAPAROTOMY;  Surgeon: Kandis Cocking, MD;  Location: [facility];  Service: General;  Laterality: N/A;  . Gastrostomy 11/19/2011    Procedure: GASTROSTOMY;  Surgeon: Kandis Cocking, MD;  Location: [facility];  Service: General;;  INSERTION GASTROSTOMY TUBE  . Application of wound vac 11/19/2011    Procedure: APPLICATION OF WOUND VAC;  Surgeon: Kandis Cocking, MD;  Location: [facility];  Service: General;  Laterality: N/A;     Family History  Problem Relation Age of Onset  . Emphysema Paternal Uncle   . Heart disease Mother   . Colon cancer Mother   . Pancreatic cancer Father   . Cancer Father 54    pancreatic cancer  . Tuberculosis Paternal Uncle   . Breast cancer Maternal Aunt   . Breast cancer Cousin   . Prostate cancer Cousin   . Colon cancer Cousin   . Uterine cancer Cousin       History   Social History  . Marital  Status: Married    Spouse Name: N/A    Number of Children: 3  . Years of Education: N/A   Occupational History  . Home Maker    Social History Main Topics  . Smoking status: Former Smoker -- 0.5 packs/day for 21 years    Types: Cigarettes    Quit date: 06/20/1977  . Smokeless tobacco: Never Used  . Alcohol Use: Yes     1 glass wine/night  . Drug Use: No  . Sexually Active: Not on file   Other Topics Concern  . Not on file   Social History Narrative  . No narrative on file     BP 110/66  Pulse 67  Ht 5' 4.5" (1.638 m)  Wt 156 lb (70.761 kg)  BMI 26.36 kg/m2  SpO2 96%  Physical Exam:  Well appearing 78 year old woman, NAD HEENT: Unremarkable Neck:  7 cm JVD, no thyromegally Lungs:  Clear with decreased breath sounds. There are scattered rhonchi. No increased work of breathing. HEART:  Regular rate rhythm, no murmurs, no rubs, no clicks Abd:  soft, positive bowel sounds, no organomegally, no rebound, no guarding Ext:  2 plus pulses, no edema, no cyanosis, no clubbing Skin:  No rashes no nodules Neuro:  CN II through XII intact, motor grossly intact  DEVICE  Normal device function.  See PaceArt for details.   Assess/Plan:

## 2012-03-19 NOTE — Patient Instructions (Addendum)
Your physician wants you to follow-up in: DR. Ladona Ridgel IN ONE YEAR. You will receive a reminder letter in the mail two months in advance. If you don't receive a letter, please call our office to schedule the follow-up appointment.   Your physician recommends that you continue on your current medications as directed. Please refer to the Current Medication list given to you today.

## 2012-03-19 NOTE — Assessment & Plan Note (Signed)
Interrogation of her Medtronic pacemaker demonstrates underlying complete heart block. Her battery is approximately 2 and half years to elective replacement. We'll plan to recheck her device in several months.

## 2012-03-19 NOTE — Assessment & Plan Note (Signed)
She appears to be maintaining sinus rhythm. She is mode switch to less than 0.1% of the time.

## 2012-03-26 ENCOUNTER — Encounter (HOSPITAL_BASED_OUTPATIENT_CLINIC_OR_DEPARTMENT_OTHER): Payer: Medicare Other | Attending: Plastic Surgery

## 2012-03-26 DIAGNOSIS — L98499 Non-pressure chronic ulcer of skin of other sites with unspecified severity: Secondary | ICD-10-CM | POA: Insufficient documentation

## 2012-03-27 NOTE — Progress Notes (Signed)
[facility]  NAME:  [name] NO.:  [id number]  MEDICAL RECORD NO.:  [id number]      DATE OF BIRTH:  03-Apr-1934  PHYSICIAN:  Wayland Denis, DO       VISIT DATE:  03/26/2012                                  OFFICE VISIT   The patient is a 78 year old female who is here for followup on her abdominal ulcer.  She has been using Endoform over the last several weeks and done extremely well with healing it in both from depth and from the size.  There has been no change in her medication or social history.  PHYSICAL EXAMINATION:  GENERAL:  She is alert, oriented, cooperative, not in any acute distress.  She is very pleasant. HEENT:  Pupils are equal.  Extraocular muscles are intact. NECK:  No cervical lymphadenopathy. ABDOMEN:  She does have a large hernia but her abdomen is otherwise soft.  The wound sizes are noted in the nurse's note and markedly improved. The periwound area looks good.  No sign of infection.  We will continue with the Endoform.  She also mentioned intermittent hives on the bottom of her feet and she is going to check to be sure she has not changed anything as far as lotions, detergents, or cleaning supplies for her shower.  We will see her back in 1 week.     Wayland Denis, DO     CS/MEDQ  D:  03/26/2012  T:  03/27/2012  Job:  [id number]

## 2012-03-28 ENCOUNTER — Encounter (INDEPENDENT_AMBULATORY_CARE_PROVIDER_SITE_OTHER): Payer: Self-pay | Admitting: General Surgery

## 2012-03-28 ENCOUNTER — Ambulatory Visit (INDEPENDENT_AMBULATORY_CARE_PROVIDER_SITE_OTHER): Payer: Medicare Other | Admitting: General Surgery

## 2012-03-28 VITALS — BP 128/84 | HR 92 | Temp 98.3°F | Resp 16 | Ht 64.5 in | Wt 153.2 lb

## 2012-03-28 DIAGNOSIS — S31109A Unspecified open wound of abdominal wall, unspecified quadrant without penetration into peritoneal cavity, initial encounter: Secondary | ICD-10-CM

## 2012-03-28 NOTE — Progress Notes (Signed)
Subjective:     Patient ID: [name], female   DOB: October 01, 1933, 78 y.o.   MRN: [id number]  HPI This patient follows up status post open sigmoid colectomy with end colostomy for perforated diverticulitis which was complicated by abdominal wound dehiscence and reoperation. She is, long way since her original procedure and seems to be getting much stronger and is no longer [place] dependent although she is walking with a walker today. She has no double pain or abdominal complaints she says that her wound has been healing dramatically. She is currently followed in the wound care clinic with Dr. Kelly Splinter and she is using she can test and matrix. She says that her bowels are functioning and she is taking good care of her ostomy but she did say that she's had some "tarry" stools. She says that they are not black or bloody and there still brown but of a consistency which is sticky. She does take a stool softener over-the-counter. She says that she had a colonoscopy about 3 years ago which was normal  Review of Systems     Objective:   Physical Exam Her abdomen is nontender she has a dressing in place from the wound care clinic and she says it is not due to be removed until Monday. Her ostomy looks fine there is no evidence of any melena or blood    Assessment:     Status post Hartmann colectomy with abdominal wound She says that her wound is healing remarkably well and she is very pleased with the status of this. She is currently receiving care for this at the wound care clinic. We discussed good bowel hygiene and fiber supplementation and I recommended that she increase her fluid intake and fiber supplementation.  She seems to be doing very well and it doesn't sound like she will need skin grafting for closure of this wound.    Plan:     I recommended that she continue with current treatment at the wound care clinic and I will see her back on an as-needed basis.

## 2012-04-02 ENCOUNTER — Encounter (HOSPITAL_BASED_OUTPATIENT_CLINIC_OR_DEPARTMENT_OTHER): Payer: Medicare Other

## 2012-04-02 ENCOUNTER — Telehealth: Payer: Self-pay | Admitting: Oncology

## 2012-04-02 ENCOUNTER — Encounter: Payer: Self-pay | Admitting: Oncology

## 2012-04-02 ENCOUNTER — Ambulatory Visit (HOSPITAL_BASED_OUTPATIENT_CLINIC_OR_DEPARTMENT_OTHER): Payer: Medicare Other | Admitting: Oncology

## 2012-04-02 VITALS — BP 141/82 | HR 87 | Temp 98.0°F | Resp 18 | Ht 64.5 in | Wt 154.1 lb

## 2012-04-02 DIAGNOSIS — K922 Gastrointestinal hemorrhage, unspecified: Secondary | ICD-10-CM

## 2012-04-02 DIAGNOSIS — D500 Iron deficiency anemia secondary to blood loss (chronic): Secondary | ICD-10-CM

## 2012-04-02 DIAGNOSIS — D518 Other vitamin B12 deficiency anemias: Secondary | ICD-10-CM

## 2012-04-02 DIAGNOSIS — E538 Deficiency of other specified B group vitamins: Secondary | ICD-10-CM

## 2012-04-02 DIAGNOSIS — I4891 Unspecified atrial fibrillation: Secondary | ICD-10-CM

## 2012-04-02 HISTORY — DX: Deficiency of other specified B group vitamins: E53.8

## 2012-04-02 NOTE — Patient Instructions (Addendum)
1.  Anemia of iron deficiency from chronic blood loss from diverticulosis.   Will continue to monitor to see if IV iron is needed.  Patient could not tolerate oral iron. 2.  Vitamin B12 deficiency:  Continue monthly VitB12 injection per primary care doctor.   3.  Follow up:  Lab only appointment in about 3 and 6 months.  Return visit in about 9 months.

## 2012-04-02 NOTE — Telephone Encounter (Signed)
gv and printed appt schedule for July 2014 for pt.

## 2012-04-02 NOTE — Progress Notes (Signed)
[facility]  [phone] [phone]   OFFICE PROGRESS NOTE   Cc:  [name], MD  DIAGNOSIS: Iron-deficiency anemia presumably from diverculosis and VitB12 deficiency.   PAST THERAPY: IV iron.   CURRENT THERAPY: Watchful observation and repeat IV iron prn severe iron deficiency; VitB12 injection per PCP.  INTERVAL HISTORY: [name] 78 y.o. female returns for regular follow up.  She had diverticular abscess and had resection with permanent colostomy in 10/2011 with prolonged recovery course.  She is now staying at [facility].  She has not had VitB12 injection for a long time.  She has generalized fatigue.  She spends a lot of awake time at rest.  She has mild abdominal/pelvic discomfort from the recent surgery.  She still has an open wound and has wound care.  The wound is closing up slowly but surely.  She has mild to moderate SOB and DOE.  She denied chest pain, pedal edema, PND, orthopnea.   Patient denies headache, visual changes, confusion, drenching night sweats, palpable lymph node swelling, mucositis, odynophagia, dysphagia, nausea vomiting, jaundice, productive cough, gum bleeding, epistaxis, hematemesis, hemoptysis, abdominal pain, abdominal swelling, early satiety, melena, hematochezia, hematuria, skin rash, spontaneous bleeding, joint swelling, joint pain, heat or cold intolerance, bowel bladder incontinence, back pain, focal motor weakness, paresthesia, depression, suicidal or homicidal ideation, feeling hopelessness.   Past Medical History  Diagnosis Date  . Insomnia, unspecified   . Osteoarthrosis, unspecified whether generalized or localized, unspecified site   . Unspecified hypothyroidism   . Unspecified essential hypertension   . Atrial fibrillation   . Sciatica   . Anemia   . COPD (chronic obstructive pulmonary disease)   . UTI (urinary tract infection)   . GERD (gastroesophageal reflux disease)   . Pneumonia   .  Asthma   . Clostridium difficile colitis   . Diverticulitis of sigmoid colon 11/2010    with abscess/contained perforation  . Dementia     mild  . Dehydration with hyponatremia 12/2010    hospitalized  . Angiodysplasia of stomach     ablated 2010  . Angiodysplasia of colon     ablated 2010  . Vitamin B12 deficiency (dietary) anemia 04/12/2011  . Shingles   . Sarcoma     right hand  . Malignant neoplasm of breast (female), unspecified site     left  . Dupuytren's disease     left hand  . Vitamin B12 deficiency 04/02/2012    Past Surgical History  Procedure Date  . Nasal sinus surgery     x 3  . Appendectomy   . Hernia repair   . Abdominal hysterectomy   . Pacemaker insertion   . Eye surgery     x6-bilateral  . Finger amputation     right  . Foot surgery     right toes x 3  . Tonsillectomy   . Colonoscopy 01/2009    w/ablation of 3 mm AVM, diverticulosis, external hemorrhoids  . Upper gastrointestinal endoscopy 01/2009    w/polypectomy, AVM, hyperplastic polyp  . Abdominal hysterectomy   . Laparotomy 11/12/2011    Procedure: EXPLORATORY LAPAROTOMY;  Surgeon: Lodema Pilot, DO;  Location: [facility];  Service: General;  Laterality: N/A;  . Colostomy revision 11/12/2011    Procedure: COLON RESECTION SIGMOID;  Surgeon: Lodema Pilot, DO;  Location: [facility];  Service: General;  Laterality: N/A;  . Colostomy 11/12/2011    Procedure: COLOSTOMY;  Surgeon: Lodema Pilot, DO;  Location: [facility];  Service:  General;  Laterality: Left;  End colostomy  . Laparotomy 11/19/2011    Procedure: EXPLORATORY LAPAROTOMY;  Surgeon: Kandis Cocking, MD;  Location: [facility];  Service: General;  Laterality: N/A;  . Gastrostomy 11/19/2011    Procedure: GASTROSTOMY;  Surgeon: Kandis Cocking, MD;  Location: [facility];  Service: General;;  INSERTION GASTROSTOMY TUBE  . Application of wound vac 11/19/2011    Procedure: APPLICATION OF WOUND VAC;  Surgeon: Kandis Cocking, MD;  Location: [facility];  Service: General;   Laterality: N/A;    Current Outpatient Prescriptions  Medication Sig Dispense Refill  . acetaminophen (TYLENOL) 325 MG tablet Take 650 mg by mouth every 6 (six) hours as needed.      . ALPRAZolam (XANAX) 0.25 MG tablet Take 0.25 mg by mouth at bedtime as needed.      [name] azelastine (ASTELIN) 137 MCG/SPRAY nasal spray Place 2 sprays into the nose at bedtime. Use in each nostril as directed      . Beclomethasone Dipropionate (QVAR IN) Inhale 2 puffs into the lungs 2 (two) times daily.      . Budesonide (RHINOCORT AQUA NA) Place into the nose daily.      . carvedilol (COREG) 3.125 MG tablet Take 3.125 mg by mouth 2 (two) times daily with a meal.      . Casanthranol-Docusate Sodium 30-100 MG CAPS Take by mouth daily.      . diphenhydrAMINE (BENADRYL) 25 mg capsule Take 25 mg by mouth at bedtime as needed.      . furosemide (LASIX) 20 MG tablet Take 20 mg by mouth daily as needed. For fluid retention.      [name] guaiFENesin (MUCINEX) 600 MG 12 hr tablet Take 600 mg by mouth as needed.       [name] ipratropium-albuterol (DUONEB) 0.5-2.5 (3) MG/3ML SOLN Take 3 mLs by nebulization 2 (two) times daily as needed.      [name] levothyroxine (SYNTHROID, LEVOTHROID) 50 MCG tablet Take 50 mcg by mouth daily before breakfast.       . losartan (COZAAR) 50 MG tablet Take 50 mg by mouth daily.      . magnesium oxide (MAG-OX) 400 MG tablet Take 400 mg by mouth daily.      . metoprolol tartrate (LOPRESSOR) 25 MG tablet Take 25 mg by mouth 2 (two) times daily.      . mirtazapine (REMERON) 30 MG tablet Take 30 mg by mouth at bedtime.      . Multiple Vitamin (MULTIVITAMIN WITH MINERALS) TABS Take 1 tablet by mouth daily.      [name] oxyCODONE (OXY IR/ROXICODONE) 5 MG immediate release tablet Take 5 mg by mouth every 4 (four) hours as needed.      . pantoprazole (PROTONIX) 40 MG tablet Take 40 mg by mouth daily.      . pravastatin (PRAVACHOL) 40 MG tablet Take 40 mg by mouth daily.      . predniSONE (DELTASONE) 5 MG tablet Take 7.5 mg  by mouth daily.       . promethazine (PHENERGAN) 25 MG tablet Take 25 mg by mouth every 6 (six) hours as needed.      . Vitamin D, Ergocalciferol, (DRISDOL) 50000 UNITS CAPS Take 50,000 Units by mouth every 7 (seven) days. On Sundays      . zafirlukast (ACCOLATE) 20 MG tablet Take 20 mg by mouth 2 (two) times daily.      [name] zolpidem (AMBIEN) 10 MG tablet Take 10 mg by mouth at bedtime  as needed.        ALLERGIES:  is allergic to aspirin; ibuprofen; iron dextran; metoclopramide hcl; and sulfonamide derivatives.  REVIEW OF SYSTEMS:  The rest of the 14-point review of system was negative.   Filed Vitals:   04/02/12 1206  BP: 141/82  Pulse: 87  Temp: 98 F (36.7 C)  Resp: 18   Wt Readings from Last 3 Encounters:  04/02/12 154 lb 1.6 oz (69.899 kg)  03/28/12 153 lb 3.2 oz (69.491 kg)  03/19/12 156 lb (70.761 kg)   ECOG Performance status:  2  PHYSICAL EXAMINATION:   General:  well-nourished woman, in no acute distress.  Eyes:  no scleral icterus.  ENT:  There were no oropharyngeal lesions.  Neck was without thyromegaly.  Lymphatics:  Negative cervical, supraclavicular or axillary adenopathy.  Respiratory: lungs were clear bilaterally without wheezing or crackles.  Cardiovascular:  Regular rate and rhythm, S1/S2, without murmur, rub or gallop.  There was no pedal edema.  GI:  abdomen was soft, flat, nontender, nondistended, without organomegaly.  Colostomy in place.  There was mid vertical abdominal wound in dressing.  She preferred that I did not open the packing.  Muscoloskeletal:  no spinal tenderness of palpation of vertebral spine.  Skin exam was without echymosis, petichae.  Neuro exam was nonfocal.  Patient was able to get on and off exam table with minimal assistance.  Gait was normal using a walker.  Patient was alerted and oriented.  Attention was good.   Language was appropriate.  Mood was normal without depression.  Speech was not pressured.  Thought content was not tangential.        LABORATORY/RADIOLOGY DATA:  Lab Results  Component Value Date   WBC 9.2 12/19/2011   HGB 9.6* 12/19/2011   HCT 30.2* 12/19/2011   PLT 206 12/19/2011   GLUCOSE 80 12/19/2011   CHOL 132 11/28/2011   TRIG 122 11/28/2011   ALKPHOS 88 12/03/2011   ALT 28 12/03/2011   AST 23 12/03/2011   NA 135 12/19/2011   K 3.9 12/19/2011   CL 96 12/19/2011   CREATININE 1.13* 12/19/2011   BUN 23 12/19/2011   CO2 33* 12/19/2011   INR 1.01 12/05/2011     ASSESSMENT AND PLAN:  1. Anemia: from VitB12 deficiency and iron deficiency from chronic diverticular bleed. For unknown reason, her VitB12 injection was dropped off the list after discharge from hospital.  I again requested Vit B12 at 1,000 mcg IM once a month at [facility].  I requested CBC and iron panel to see how her anemia is today compared to 3 months ago.  If her ferritin is low again, I will consider repeating IV Feraheme.  She could not tolerate oral iron in the past due to constipation, abdominal pain and would not try it again.   2. History of breast cancer. She was supposed to have repeat mammogram by 02/2012.  She would like to arrange this herself.  3. History of sarcoma. In remission at this time.  4. Chronic obstructive pulmonary disease. She is on inhalers and O2.  5. Allergies. She is on prednisone and montelukast.  6. Congestive heart failure. She is on Lasix, losartan, and metoprolol per cardiologist. She is euvolumic on exam today.  7. Atrial fibrillation. She is on Coumadin per cardiologist, and rate control with metoprolol.  8. Hyperlipidemia. She is on pravastatin. 9. Hypothyroidism. She is on levothyroxine per PCP.  10. Chronic low-back pain from degenerative joint disease on chronic Vicodin.  11. Diverticulosis with recent abscess s/p colectomy.   12. Flu shot:  Already with nursing home.  13. Follow up: Lab in 3 and 6 months. Return visit in about 9 months.     The length of time of the face-to-face encounter was 15  minutes. More than  50% of time was spent counseling and coordination of care.

## 2012-04-03 NOTE — Progress Notes (Signed)
Received lab results from [facility]; forwarded to Dr. Ha. 

## 2012-04-04 ENCOUNTER — Encounter: Payer: Self-pay | Admitting: *Deleted

## 2012-04-04 ENCOUNTER — Other Ambulatory Visit: Payer: Self-pay | Admitting: Oncology

## 2012-04-04 NOTE — Progress Notes (Signed)
CBC rec'd from [facility].  Hgb 13.2.  Ferritin 27.  Forwarded to Dr. Gaylyn Rong for review.

## 2012-04-19 ENCOUNTER — Inpatient Hospital Stay (HOSPITAL_COMMUNITY)
Admission: EM | Admit: 2012-04-19 | Discharge: 2012-04-23 | DRG: 536 | Disposition: A | Discharge status: Skilled Nursing Facility | Payer: Medicare Other | Attending: Internal Medicine | Admitting: Internal Medicine

## 2012-04-19 ENCOUNTER — Encounter (HOSPITAL_COMMUNITY): Payer: Self-pay | Admitting: *Deleted

## 2012-04-19 ENCOUNTER — Emergency Department (HOSPITAL_COMMUNITY): Payer: Medicare Other

## 2012-04-19 DIAGNOSIS — S6990XA Unspecified injury of unspecified wrist, hand and finger(s), initial encounter: Secondary | ICD-10-CM | POA: Diagnosis present

## 2012-04-19 DIAGNOSIS — F0390 Unspecified dementia without behavioral disturbance: Secondary | ICD-10-CM | POA: Diagnosis present

## 2012-04-19 DIAGNOSIS — D638 Anemia in other chronic diseases classified elsewhere: Secondary | ICD-10-CM | POA: Diagnosis present

## 2012-04-19 DIAGNOSIS — K659 Peritonitis, unspecified: Secondary | ICD-10-CM

## 2012-04-19 DIAGNOSIS — R296 Repeated falls: Secondary | ICD-10-CM | POA: Diagnosis present

## 2012-04-19 DIAGNOSIS — Y833 Surgical operation with formation of external stoma as the cause of abnormal reaction of the patient, or of later complication, without mention of misadventure at the time of the procedure: Secondary | ICD-10-CM | POA: Diagnosis present

## 2012-04-19 DIAGNOSIS — I5022 Chronic systolic (congestive) heart failure: Secondary | ICD-10-CM | POA: Diagnosis present

## 2012-04-19 DIAGNOSIS — IMO0002 Reserved for concepts with insufficient information to code with codable children: Secondary | ICD-10-CM

## 2012-04-19 DIAGNOSIS — Z8719 Personal history of other diseases of the digestive system: Secondary | ICD-10-CM

## 2012-04-19 DIAGNOSIS — I509 Heart failure, unspecified: Secondary | ICD-10-CM | POA: Diagnosis present

## 2012-04-19 DIAGNOSIS — T8130XA Disruption of wound, unspecified, initial encounter: Secondary | ICD-10-CM | POA: Diagnosis present

## 2012-04-19 DIAGNOSIS — Z66 Do not resuscitate: Secondary | ICD-10-CM | POA: Diagnosis present

## 2012-04-19 DIAGNOSIS — Z90710 Acquired absence of both cervix and uterus: Secondary | ICD-10-CM

## 2012-04-19 DIAGNOSIS — Z8619 Personal history of other infectious and parasitic diseases: Secondary | ICD-10-CM

## 2012-04-19 DIAGNOSIS — Z931 Gastrostomy status: Secondary | ICD-10-CM

## 2012-04-19 DIAGNOSIS — J383 Other diseases of vocal cords: Secondary | ICD-10-CM

## 2012-04-19 DIAGNOSIS — Z853 Personal history of malignant neoplasm of breast: Secondary | ICD-10-CM

## 2012-04-19 DIAGNOSIS — S59909A Unspecified injury of unspecified elbow, initial encounter: Secondary | ICD-10-CM | POA: Diagnosis present

## 2012-04-19 DIAGNOSIS — I10 Essential (primary) hypertension: Secondary | ICD-10-CM | POA: Diagnosis present

## 2012-04-19 DIAGNOSIS — Z7901 Long term (current) use of anticoagulants: Secondary | ICD-10-CM

## 2012-04-19 DIAGNOSIS — E538 Deficiency of other specified B group vitamins: Secondary | ICD-10-CM | POA: Diagnosis present

## 2012-04-19 DIAGNOSIS — K5900 Constipation, unspecified: Secondary | ICD-10-CM | POA: Diagnosis not present

## 2012-04-19 DIAGNOSIS — S32509A Unspecified fracture of unspecified pubis, initial encounter for closed fracture: Principal | ICD-10-CM | POA: Diagnosis present

## 2012-04-19 DIAGNOSIS — J9600 Acute respiratory failure, unspecified whether with hypoxia or hypercapnia: Secondary | ICD-10-CM

## 2012-04-19 DIAGNOSIS — J95821 Acute postprocedural respiratory failure: Secondary | ICD-10-CM

## 2012-04-19 DIAGNOSIS — Z882 Allergy status to sulfonamides status: Secondary | ICD-10-CM

## 2012-04-19 DIAGNOSIS — G8929 Other chronic pain: Secondary | ICD-10-CM

## 2012-04-19 DIAGNOSIS — J45909 Unspecified asthma, uncomplicated: Secondary | ICD-10-CM

## 2012-04-19 DIAGNOSIS — Z9089 Acquired absence of other organs: Secondary | ICD-10-CM

## 2012-04-19 DIAGNOSIS — E039 Hypothyroidism, unspecified: Secondary | ICD-10-CM | POA: Diagnosis present

## 2012-04-19 DIAGNOSIS — S32599A Other specified fracture of unspecified pubis, initial encounter for closed fracture: Secondary | ICD-10-CM | POA: Diagnosis present

## 2012-04-19 DIAGNOSIS — Z886 Allergy status to analgesic agent status: Secondary | ICD-10-CM

## 2012-04-19 DIAGNOSIS — K5780 Diverticulitis of intestine, part unspecified, with perforation and abscess without bleeding: Secondary | ICD-10-CM

## 2012-04-19 DIAGNOSIS — Z87891 Personal history of nicotine dependence: Secondary | ICD-10-CM

## 2012-04-19 DIAGNOSIS — K219 Gastro-esophageal reflux disease without esophagitis: Secondary | ICD-10-CM | POA: Diagnosis present

## 2012-04-19 DIAGNOSIS — S32810A Multiple fractures of pelvis with stable disruption of pelvic ring, initial encounter for closed fracture: Secondary | ICD-10-CM

## 2012-04-19 DIAGNOSIS — Z8679 Personal history of other diseases of the circulatory system: Secondary | ICD-10-CM

## 2012-04-19 DIAGNOSIS — I4891 Unspecified atrial fibrillation: Secondary | ICD-10-CM | POA: Diagnosis present

## 2012-04-19 DIAGNOSIS — Z933 Colostomy status: Secondary | ICD-10-CM

## 2012-04-19 DIAGNOSIS — R5381 Other malaise: Secondary | ICD-10-CM | POA: Diagnosis present

## 2012-04-19 DIAGNOSIS — R195 Other fecal abnormalities: Secondary | ICD-10-CM

## 2012-04-19 DIAGNOSIS — J309 Allergic rhinitis, unspecified: Secondary | ICD-10-CM

## 2012-04-19 DIAGNOSIS — S68118A Complete traumatic metacarpophalangeal amputation of other finger, initial encounter: Secondary | ICD-10-CM

## 2012-04-19 DIAGNOSIS — M1990 Unspecified osteoarthritis, unspecified site: Secondary | ICD-10-CM

## 2012-04-19 DIAGNOSIS — J4489 Other specified chronic obstructive pulmonary disease: Secondary | ICD-10-CM | POA: Diagnosis present

## 2012-04-19 DIAGNOSIS — I498 Other specified cardiac arrhythmias: Secondary | ICD-10-CM

## 2012-04-19 DIAGNOSIS — Z888 Allergy status to other drugs, medicaments and biological substances status: Secondary | ICD-10-CM

## 2012-04-19 DIAGNOSIS — D518 Other vitamin B12 deficiency anemias: Secondary | ICD-10-CM

## 2012-04-19 DIAGNOSIS — Z950 Presence of cardiac pacemaker: Secondary | ICD-10-CM

## 2012-04-19 DIAGNOSIS — G4700 Insomnia, unspecified: Secondary | ICD-10-CM

## 2012-04-19 DIAGNOSIS — C801 Malignant (primary) neoplasm, unspecified: Secondary | ICD-10-CM

## 2012-04-19 DIAGNOSIS — Z9981 Dependence on supplemental oxygen: Secondary | ICD-10-CM

## 2012-04-19 DIAGNOSIS — D500 Iron deficiency anemia secondary to blood loss (chronic): Secondary | ICD-10-CM | POA: Diagnosis present

## 2012-04-19 DIAGNOSIS — Y92009 Unspecified place in unspecified non-institutional (private) residence as the place of occurrence of the external cause: Secondary | ICD-10-CM

## 2012-04-19 DIAGNOSIS — J449 Chronic obstructive pulmonary disease, unspecified: Secondary | ICD-10-CM | POA: Diagnosis present

## 2012-04-19 DIAGNOSIS — M720 Palmar fascial fibromatosis [Dupuytren]: Secondary | ICD-10-CM | POA: Diagnosis present

## 2012-04-19 DIAGNOSIS — C50919 Malignant neoplasm of unspecified site of unspecified female breast: Secondary | ICD-10-CM

## 2012-04-19 DIAGNOSIS — Z79899 Other long term (current) drug therapy: Secondary | ICD-10-CM

## 2012-04-19 HISTORY — DX: Other complications of anesthesia, initial encounter: T88.59XA

## 2012-04-19 HISTORY — DX: Adverse effect of unspecified anesthetic, initial encounter: T41.45XA

## 2012-04-19 HISTORY — DX: Presence of cardiac pacemaker: Z95.0

## 2012-04-19 LAB — COMPREHENSIVE METABOLIC PANEL
AST: 32 U/L (ref 0–37)
Albumin: 3.3 g/dL — ABNORMAL LOW (ref 3.5–5.2)
Alkaline Phosphatase: 60 U/L (ref 39–117)
BUN: 16 mg/dL (ref 6–23)
Chloride: 102 mEq/L (ref 96–112)
Potassium: 4 mEq/L (ref 3.5–5.1)
Total Bilirubin: 0.3 mg/dL (ref 0.3–1.2)

## 2012-04-19 LAB — CBC
MCH: 29.8 pg (ref 26.0–34.0)
MCHC: 32.6 g/dL (ref 30.0–36.0)
MCV: 91.3 fL (ref 78.0–100.0)
Platelets: 157 10*3/uL (ref 150–400)
RDW: 13.9 % (ref 11.5–15.5)

## 2012-04-19 MED ORDER — IPRATROPIUM-ALBUTEROL 0.5-2.5 (3) MG/3ML IN SOLN: 3.0000 mL | RESPIRATORY_TRACT | Status: DC | PRN

## 2012-04-19 MED ORDER — ZOLPIDEM TARTRATE 5 MG PO TABS
5.0000 mg | ORAL_TABLET | Freq: Every evening | ORAL | Status: DC | PRN
  Administered 2012-04-19 – 2012-04-22 (×4): 5 mg via ORAL
  Filled 2012-04-19 (×4): qty 1

## 2012-04-19 MED ORDER — ALBUTEROL SULFATE (5 MG/ML) 0.5% IN NEBU: 2.5000 mg | INHALATION_SOLUTION | RESPIRATORY_TRACT | Status: DC | PRN

## 2012-04-19 MED ORDER — FLUTICASONE PROPIONATE 50 MCG/ACT NA SUSP
1.0000 | Freq: Every day | NASAL | Status: DC
  Administered 2012-04-20 – 2012-04-23 (×4): 1 via NASAL
  Filled 2012-04-19: qty 16

## 2012-04-19 MED ORDER — FENTANYL CITRATE 0.05 MG/ML IJ SOLN: 100.0000 ug | Freq: Once | INTRAMUSCULAR | Status: DC

## 2012-04-19 MED ORDER — MONTELUKAST SODIUM 10 MG PO TABS
10.0000 mg | ORAL_TABLET | Freq: Every day | ORAL | Status: DC
  Administered 2012-04-19 – 2012-04-22 (×3): 10 mg via ORAL
  Filled 2012-04-19 (×6): qty 1

## 2012-04-19 MED ORDER — GUAIFENESIN ER 600 MG PO TB12
600.0000 mg | ORAL_TABLET | Freq: Two times a day (BID) | ORAL | Status: DC | PRN
  Filled 2012-04-19: qty 1

## 2012-04-19 MED ORDER — SENNOSIDES-DOCUSATE SODIUM 8.6-50 MG PO TABS: 1.0000 | ORAL_TABLET | Freq: Every evening | ORAL | Status: DC | PRN

## 2012-04-19 MED ORDER — METOPROLOL SUCCINATE ER 25 MG PO TB24
25.0000 mg | ORAL_TABLET | Freq: Every day | ORAL | Status: DC
  Administered 2012-04-19 – 2012-04-23 (×5): 25 mg via ORAL
  Filled 2012-04-19 (×5): qty 1

## 2012-04-19 MED ORDER — PANTOPRAZOLE SODIUM 40 MG PO TBEC
40.0000 mg | DELAYED_RELEASE_TABLET | Freq: Every day | ORAL | Status: DC
  Administered 2012-04-20 – 2012-04-23 (×4): 40 mg via ORAL
  Filled 2012-04-19 (×4): qty 1

## 2012-04-19 MED ORDER — HYPROMELLOSE (GONIOSCOPIC) 2.5 % OP SOLN: 2.0000 [drp] | Freq: Two times a day (BID) | OPHTHALMIC | Status: DC

## 2012-04-19 MED ORDER — ACETAMINOPHEN 650 MG RE SUPP: 650.0000 mg | Freq: Four times a day (QID) | RECTAL | Status: DC | PRN

## 2012-04-19 MED ORDER — ONDANSETRON HCL 4 MG PO TABS: 4.0000 mg | ORAL_TABLET | Freq: Four times a day (QID) | ORAL | Status: DC | PRN

## 2012-04-19 MED ORDER — DULOXETINE HCL 30 MG PO CPEP
30.0000 mg | ORAL_CAPSULE | Freq: Every day | ORAL | Status: DC
  Administered 2012-04-19 – 2012-04-23 (×5): 30 mg via ORAL
  Filled 2012-04-19 (×5): qty 1

## 2012-04-19 MED ORDER — SIMVASTATIN 20 MG PO TABS
20.0000 mg | ORAL_TABLET | Freq: Every day | ORAL | Status: DC
  Administered 2012-04-19 – 2012-04-22 (×4): 20 mg via ORAL
  Filled 2012-04-19 (×5): qty 1

## 2012-04-19 MED ORDER — IPRATROPIUM BROMIDE 0.02 % IN SOLN: 0.5000 mg | RESPIRATORY_TRACT | Status: DC | PRN

## 2012-04-19 MED ORDER — ONDANSETRON HCL 4 MG/2ML IJ SOLN
4.0000 mg | Freq: Four times a day (QID) | INTRAMUSCULAR | Status: DC | PRN
  Administered 2012-04-19 – 2012-04-20 (×2): 4 mg via INTRAVENOUS
  Filled 2012-04-19 (×2): qty 2

## 2012-04-19 MED ORDER — CARVEDILOL 3.125 MG PO TABS
3.1250 mg | ORAL_TABLET | Freq: Two times a day (BID) | ORAL | Status: DC
Start: 2012-04-19 — End: 2012-04-19
  Filled 2012-04-19 (×2): qty 1

## 2012-04-19 MED ORDER — LOSARTAN POTASSIUM 50 MG PO TABS
50.0000 mg | ORAL_TABLET | Freq: Every day | ORAL | Status: DC
  Administered 2012-04-20 – 2012-04-23 (×4): 50 mg via ORAL
  Filled 2012-04-19 (×4): qty 1

## 2012-04-19 MED ORDER — ONDANSETRON 4 MG PO TBDP
ORAL_TABLET | ORAL | Status: AC
  Administered 2012-04-19: 4 mg via ORAL
  Filled 2012-04-19: qty 1

## 2012-04-19 MED ORDER — MIRTAZAPINE 30 MG PO TABS
30.0000 mg | ORAL_TABLET | Freq: Every day | ORAL | Status: DC
  Filled 2012-04-19: qty 1

## 2012-04-19 MED ORDER — MORPHINE SULFATE 2 MG/ML IJ SOLN
1.0000 mg | INTRAMUSCULAR | Status: DC | PRN
  Administered 2012-04-19 – 2012-04-23 (×15): 1 mg via INTRAVENOUS
  Filled 2012-04-19 (×14): qty 1

## 2012-04-19 MED ORDER — POLYVINYL ALCOHOL 1.4 % OP SOLN
2.0000 [drp] | Freq: Two times a day (BID) | OPHTHALMIC | Status: DC
  Administered 2012-04-19 – 2012-04-23 (×8): 2 [drp] via OPHTHALMIC
  Filled 2012-04-19: qty 15

## 2012-04-19 MED ORDER — MAGNESIUM OXIDE 400 MG PO TABS
400.0000 mg | ORAL_TABLET | Freq: Every day | ORAL | Status: DC
Start: 2012-04-19 — End: 2012-04-19

## 2012-04-19 MED ORDER — HYDROMORPHONE HCL PF 1 MG/ML IJ SOLN
1.0000 mg | Freq: Once | INTRAMUSCULAR | Status: AC
  Administered 2012-04-19: 1 mg via INTRAMUSCULAR
  Filled 2012-04-19: qty 1

## 2012-04-19 MED ORDER — ALPRAZOLAM 0.25 MG PO TABS
0.2500 mg | ORAL_TABLET | Freq: Every evening | ORAL | Status: DC | PRN
  Administered 2012-04-20 – 2012-04-22 (×3): 0.25 mg via ORAL
  Filled 2012-04-19 (×3): qty 1

## 2012-04-19 MED ORDER — PROMETHAZINE HCL 25 MG PO TABS
25.0000 mg | ORAL_TABLET | Freq: Four times a day (QID) | ORAL | Status: DC | PRN
  Administered 2012-04-19 – 2012-04-20 (×2): 25 mg via ORAL
  Filled 2012-04-19 (×2): qty 1

## 2012-04-19 MED ORDER — MAGNESIUM OXIDE 400 (241.3 MG) MG PO TABS
400.0000 mg | ORAL_TABLET | Freq: Every day | ORAL | Status: DC
  Administered 2012-04-20 – 2012-04-23 (×4): 400 mg via ORAL
  Filled 2012-04-19 (×4): qty 1

## 2012-04-19 MED ORDER — ACETAMINOPHEN 325 MG PO TABS
650.0000 mg | ORAL_TABLET | Freq: Four times a day (QID) | ORAL | Status: DC | PRN
  Administered 2012-04-20 – 2012-04-22 (×4): 650 mg via ORAL
  Filled 2012-04-19 (×4): qty 2

## 2012-04-19 MED ORDER — OXYCODONE-ACETAMINOPHEN 5-325 MG PO TABS
1.0000 | ORAL_TABLET | Freq: Four times a day (QID) | ORAL | Status: DC | PRN
  Administered 2012-04-19: 2 via ORAL
  Filled 2012-04-19: qty 2

## 2012-04-19 MED ORDER — FLUTICASONE PROPIONATE HFA 44 MCG/ACT IN AERO
2.0000 | INHALATION_SPRAY | Freq: Two times a day (BID) | RESPIRATORY_TRACT | Status: DC
  Administered 2012-04-20 – 2012-04-22 (×6): 2 via RESPIRATORY_TRACT
  Filled 2012-04-19: qty 10.6

## 2012-04-19 MED ORDER — DIPHENHYDRAMINE HCL 25 MG PO CAPS: 25.0000 mg | ORAL_CAPSULE | Freq: Every evening | ORAL | Status: DC | PRN

## 2012-04-19 MED ORDER — FUROSEMIDE 20 MG PO TABS
20.0000 mg | ORAL_TABLET | Freq: Every day | ORAL | Status: DC | PRN
  Filled 2012-04-19: qty 1

## 2012-04-19 MED ORDER — PREDNISONE 5 MG PO TABS
7.5000 mg | ORAL_TABLET | Freq: Every day | ORAL | Status: DC
  Administered 2012-04-20 – 2012-04-23 (×4): 7.5 mg via ORAL
  Filled 2012-04-19 (×5): qty 1

## 2012-04-19 MED ORDER — ONDANSETRON 4 MG PO TBDP
4.0000 mg | ORAL_TABLET | Freq: Once | ORAL | Status: AC
  Administered 2012-04-19: 4 mg via ORAL

## 2012-04-19 MED ORDER — ENOXAPARIN SODIUM 40 MG/0.4ML ~~LOC~~ SOLN
40.0000 mg | SUBCUTANEOUS | Status: DC
  Administered 2012-04-19 – 2012-04-22 (×4): 40 mg via SUBCUTANEOUS
  Filled 2012-04-19 (×6): qty 0.4

## 2012-04-19 MED ORDER — ONDANSETRON HCL 4 MG/2ML IJ SOLN: 4.0000 mg | Freq: Three times a day (TID) | INTRAMUSCULAR | Status: DC | PRN

## 2012-04-19 MED ORDER — ADULT MULTIVITAMIN W/MINERALS CH
1.0000 | ORAL_TABLET | Freq: Every day | ORAL | Status: DC
  Administered 2012-04-20 – 2012-04-23 (×4): 1 via ORAL
  Filled 2012-04-19 (×4): qty 1

## 2012-04-19 MED ORDER — SODIUM CHLORIDE 0.9 % IV SOLN
INTRAVENOUS | Status: AC
  Administered 2012-04-20: 09:00:00 via INTRAVENOUS

## 2012-04-19 MED ORDER — METOPROLOL TARTRATE 25 MG PO TABS
25.0000 mg | ORAL_TABLET | Freq: Two times a day (BID) | ORAL | Status: DC
  Filled 2012-04-19: qty 1

## 2012-04-19 MED ORDER — HYDROMORPHONE HCL PF 1 MG/ML IJ SOLN: 1.0000 mg | INTRAMUSCULAR | Status: DC | PRN

## 2012-04-19 MED ORDER — ALBUTEROL SULFATE HFA 108 (90 BASE) MCG/ACT IN AERS
2.0000 | INHALATION_SPRAY | Freq: Two times a day (BID) | RESPIRATORY_TRACT | Status: DC
  Administered 2012-04-20 – 2012-04-22 (×6): 2 via RESPIRATORY_TRACT
  Filled 2012-04-19: qty 6.7

## 2012-04-19 MED ORDER — LEVOTHYROXINE SODIUM 50 MCG PO TABS
50.0000 ug | ORAL_TABLET | Freq: Every day | ORAL | Status: DC
  Administered 2012-04-20 – 2012-04-23 (×4): 50 ug via ORAL
  Filled 2012-04-19 (×5): qty 1

## 2012-04-19 NOTE — H&P (Signed)
[facility] History and Physical  [name] ZOX:[id number] DOB: Aug 21, 1933 DOA: 04/19/2012  Referring physician: Holli Humbles PCP: [name], MD  Specialists:   Chief Complaint: fall/fx pubic rame  HPI: [name] is a 78 y.o. female  With past medical hx including but not limited to COPD, afib nit on coumadin, HTN, anemia, diverticulitis with abscess/contained perforation, breast cancer presents to ED cc fall. Information obtained from patient. Reports that she was "racing" to answer the door and her foot caught on her 02 tubing and she fell straight down. She did not lose consciousness, did not hit head. Reports feeling pain in right inguinal area. Describes pain as constant worse with moving and better if lying still. Pain medicine also helps pain. Characterizes pain as moderate to severe.  She states she has been in her usual state of health. No recent illnesses, no chest pain, no fever, chills. Work up in the ED yields fx pubic rami. We are asked to admit for further eval and treatment.   Review of Systems: The patient denies anorexia, fever, weight loss,, vision loss, decreased hearing, hoarseness, chest pain, syncope, dyspnea on exertion, peripheral edema, balance deficits, hemoptysis, abdominal pain, melena, hematochezia, severe indigestion/heartburn, hematuria, incontinence, genital sores, muscle weakness, suspicious skin lesions, transient blindness, difficulty walking, depression, unusual weight change, abnormal bleeding, enlarged lymph nodes, angioedema, and breast masses.    Past Medical History  Diagnosis Date  . Insomnia, unspecified   . Osteoarthrosis, unspecified whether generalized or localized, unspecified site   . Unspecified hypothyroidism   . Unspecified essential hypertension   . Atrial fibrillation   . Sciatica   . Anemia   . COPD (chronic obstructive pulmonary disease)   . UTI (urinary tract infection)   . GERD (gastroesophageal reflux disease)   .  Pneumonia   . Asthma   . Clostridium difficile colitis   . Diverticulitis of sigmoid colon 11/2010    with abscess/contained perforation  . Dementia     mild  . Dehydration with hyponatremia 12/2010    hospitalized  . Angiodysplasia of stomach     ablated 2010  . Angiodysplasia of colon     ablated 2010  . Vitamin B12 deficiency (dietary) anemia 04/12/2011  . Shingles   . Sarcoma     right hand  . Malignant neoplasm of breast (female), unspecified site     left  . Dupuytren's disease     left hand  . Vitamin B12 deficiency 04/02/2012   Past Surgical History  Procedure Date  . Nasal sinus surgery     x 3  . Appendectomy   . Hernia repair   . Abdominal hysterectomy   . Pacemaker insertion   . Eye surgery     x6-bilateral  . Finger amputation     right  . Foot surgery     right toes x 3  . Tonsillectomy   . Colonoscopy 01/2009    w/ablation of 3 mm AVM, diverticulosis, external hemorrhoids  . Upper gastrointestinal endoscopy 01/2009    w/polypectomy, AVM, hyperplastic polyp  . Abdominal hysterectomy   . Laparotomy 11/12/2011    Procedure: EXPLORATORY LAPAROTOMY;  Surgeon: Lodema Pilot, DO;  Location: [facility];  Service: General;  Laterality: N/A;  . Colostomy revision 11/12/2011    Procedure: COLON RESECTION SIGMOID;  Surgeon: Lodema Pilot, DO;  Location: [facility];  Service: General;  Laterality: N/A;  . Colostomy 11/12/2011    Procedure: COLOSTOMY;  Surgeon: Lodema Pilot, DO;  Location: [facility];  Service: General;  Laterality: Left;  End colostomy  . Laparotomy 11/19/2011    Procedure: EXPLORATORY LAPAROTOMY;  Surgeon: Kandis Cocking, MD;  Location: [facility];  Service: General;  Laterality: N/A;  . Gastrostomy 11/19/2011    Procedure: GASTROSTOMY;  Surgeon: Kandis Cocking, MD;  Location: [facility];  Service: General;;  INSERTION GASTROSTOMY TUBE  . Application of wound vac 11/19/2011    Procedure: APPLICATION OF WOUND VAC;  Surgeon: Kandis Cocking, MD;  Location: [facility];  Service:  General;  Laterality: N/A;   Social History:  reports that she quit smoking about 34 years ago. Her smoking use included Cigarettes. She has a 10.5 pack-year smoking history. She has never used smokeless tobacco. She reports that she drinks alcohol. She reports that she does not use illicit drugs. Pt lives in independent living facility. Independent ADLS  Allergies  Allergen Reactions  . Aspirin   . Ibuprofen   . Iron Dextran     REACTION: itching after test dose  . Metoclopramide Hcl     REACTION: chomping mouth  . Sulfonamide Derivatives     Family History  Problem Relation Age of Onset  . Emphysema Paternal Uncle   . Heart disease Mother   . Colon cancer Mother   . Pancreatic cancer Father   . Cancer Father 85    pancreatic cancer  . Tuberculosis Paternal Uncle   . Breast cancer Maternal Aunt   . Breast cancer Cousin   . Prostate cancer Cousin   . Colon cancer Cousin   . Uterine cancer Cousin      Prior to Admission medications   Medication Sig Start Date End Date Taking? Authorizing Provider  acetaminophen (TYLENOL) 325 MG tablet Take 650 mg by mouth every 6 (six) hours as needed.    Historical Provider, MD  ALPRAZolam Prudy Feeler) 0.25 MG tablet Take 0.25 mg by mouth at bedtime as needed.    Historical Provider, MD  azelastine (ASTELIN) 137 MCG/SPRAY nasal spray Place 2 sprays into the nose at bedtime. Use in each nostril as directed 11/04/11 11/03/12  Tammy S Parrett, NP  Beclomethasone Dipropionate (QVAR IN) Inhale 2 puffs into the lungs 2 (two) times daily.    Historical Provider, MD  Budesonide (RHINOCORT AQUA NA) Place into the nose daily.    Historical Provider, MD  carvedilol (COREG) 3.125 MG tablet Take 3.125 mg by mouth 2 (two) times daily with a meal.    Historical Provider, MD  Casanthranol-Docusate Sodium 30-100 MG CAPS Take by mouth daily.    Historical Provider, MD  diphenhydrAMINE (BENADRYL) 25 mg capsule Take 25 mg by mouth at bedtime as needed.    Historical  Provider, MD  furosemide (LASIX) 20 MG tablet Take 20 mg by mouth daily as needed. For fluid retention.    Historical Provider, MD  guaiFENesin (MUCINEX) 600 MG 12 hr tablet Take 600 mg by mouth as needed.  12/01/11 11/30/12  Gwenyth Bender, NP  ipratropium-albuterol (DUONEB) 0.5-2.5 (3) MG/3ML SOLN Take 3 mLs by nebulization 2 (two) times daily as needed.    Historical Provider, MD  levothyroxine (SYNTHROID, LEVOTHROID) 50 MCG tablet Take 50 mcg by mouth daily before breakfast.  12/01/11 11/30/12  Gwenyth Bender, NP  losartan (COZAAR) 50 MG tablet Take 50 mg by mouth daily.    Historical Provider, MD  magnesium oxide (MAG-OX) 400 MG tablet Take 400 mg by mouth daily.    Historical Provider, MD  metoprolol tartrate (LOPRESSOR) 25 MG tablet Take 25 mg by mouth  2 (two) times daily. 12/01/11 11/30/12  Gwenyth Bender, NP  mirtazapine (REMERON) 30 MG tablet Take 30 mg by mouth at bedtime.    Historical Provider, MD  Multiple Vitamin (MULTIVITAMIN WITH MINERALS) TABS Take 1 tablet by mouth daily.    Historical Provider, MD  oxyCODONE (OXY IR/ROXICODONE) 5 MG immediate release tablet Take 5 mg by mouth every 4 (four) hours as needed.    Historical Provider, MD  pantoprazole (PROTONIX) 40 MG tablet Take 40 mg by mouth daily.    Historical Provider, MD  pravastatin (PRAVACHOL) 40 MG tablet Take 40 mg by mouth daily.    Historical Provider, MD  predniSONE (DELTASONE) 5 MG tablet Take 7.5 mg by mouth daily.     Historical Provider, MD  promethazine (PHENERGAN) 25 MG tablet Take 25 mg by mouth every 6 (six) hours as needed.    Historical Provider, MD  Vitamin D, Ergocalciferol, (DRISDOL) 50000 UNITS CAPS Take 50,000 Units by mouth every 7 (seven) days. On Sundays    Historical Provider, MD  zafirlukast (ACCOLATE) 20 MG tablet Take 20 mg by mouth 2 (two) times daily.    Historical Provider, MD  zolpidem (AMBIEN) 10 MG tablet Take 10 mg by mouth at bedtime as needed.    Historical Provider, MD   Physical Exam: Filed  Vitals:   04/19/12 1105 04/19/12 1115 04/19/12 1130  BP: 121/65 132/81 132/61  Pulse: 74 111 77  Temp: 98.1 F (36.7 C)    TempSrc: Oral    Resp: 14 20 25   SpO2: 99% 100% 98%     General:  Awake alert oriented x3  Eyes: glasses, PERRL EOMI  ENT: mucus membranes of nose/mouth pink/moist  Neck: supple No JVD No lymphadenopathy  Cardiovascular: RRR No MGR, No LEE PPP  Respiratory: normal effort. BSCTAB No wheeze/rhonchi  Abdomen: obese, soft, left lower quadrant colostomy with small amount light brown soft stool. Midline abdominal dressing dry and intact.   Skin: warm and dry. Dressing to  R elbow dry and intact covering abrasion  Musculoskeletal: MOE albeit slowly with LE due to pain. No joint swelling/erythema  Psychiatric: appropriate cooperative  Neurologic: cranial nerve II-XII intact speech clear facial symmetry  Labs on Admission:  Basic Metabolic Panel: No results found for this basename: NA:5,K:5,CL:5,CO2:5,GLUCOSE:5,BUN:5,CREATININE:5,CALCIUM:5,MG:5,PHOS:5 in the last 168 hours Liver Function Tests: No results found for this basename: AST:5,ALT:5,ALKPHOS:5,BILITOT:5,PROT:5,ALBUMIN:5 in the last 168 hours No results found for this basename: LIPASE:5,AMYLASE:5 in the last 168 hours No results found for this basename: AMMONIA:5 in the last 168 hours CBC: No results found for this basename: WBC:5,NEUTROABS:5,HGB:5,HCT:5,MCV:5,PLT:5 in the last 168 hours Cardiac Enzymes: No results found for this basename: CKTOTAL:5,CKMB:5,CKMBINDEX:5,TROPONINI:5 in the last 168 hours  BNP (last 3 results)  Basename 12/03/11 0435 11/16/11 0605 11/14/11 0500  PROBNP 2410.0* >70000.0* 11055.0*   CBG: No results found for this basename: GLUCAP:5 in the last 168 hours  Radiological Exams on Admission: Dg Chest 2 View  04/19/2012  *RADIOLOGY REPORT*  Clinical Data: Fall, shortness of breath  CHEST - 2 VIEW  Comparison: 09/03/2011  Findings: Left upper lobe calcified granuloma.   Chronic interstitial markings.  No pleural effusion or pneumothorax.  Cardiomediastinal silhouette is within normal limits.  Left subclavian pacemaker.  Surgical clips overlying the left chest wall.  Right lateral rib fracture deformities.  IMPRESSION: No evidence of acute cardiopulmonary disease.   Original Report Authenticated By: Charline Bills, M.D.    Dg Elbow Complete Right  04/19/2012  *RADIOLOGY REPORT*  Clinical Data: History  of injury from fall with posterior elbow the soft tissue laceration.  RIGHT ELBOW - COMPLETE 3+ VIEW  Comparison: None.  Findings: There is no positive fat pad sign to suggest joint effusion.  Alignment is normal.  No fracture or bony destruction is evident.  There is minimal spurring of the coronoid process of the proximal ulna.  There is slight degenerative roughening of the lateral epicondyle of the distal humerus.  A tiny calcific density is seen adjacent to the radial head.  This may reflect previous trauma.  A tiny loose body or previous chip fracture cannot be excluded.  IMPRESSION: History given of injury of the posterior aspect of the elbow with soft tissue laceration.  No fracture of the distal humerus or olecranon is seen.  No dislocation is evident.  There is no evidence of joint effusion.  There is slight degenerative roughening of the lateral epicondyle of the distal humerus. There is minimal beginning spurring of the coronoid process of the proximal ulna.  A tiny calcific density is seen adjacent to the radial head.  This may reflect previous trauma.  A tiny loose body or previous chip fracture cannot be excluded.   Original Report Authenticated By: Onalee Hua Call    Dg Hip Complete Right  04/19/2012  *RADIOLOGY REPORT*  Clinical Data: History of injury from fall with pain in the right inguinal region.  RIGHT HIP - COMPLETE 2+ VIEW  Comparison: CT 12/03/2010.  Findings: There is acute fracture of the medial aspect of the right pubic bone with inferior displacement  of the medial distal fracture fragment.  There is a hairline fracture through the inferior aspect of the right ischial ramus.  No dislocation is evident. No hip fracture or dislocation is evident.  There is chronic enthesophyte spurring of the greater trochanter area bilaterally which is chronic.  There is scoliosis.  There is osteopenic appearance of bones.  There is degenerative spondylosis.  Pelvic phleboliths are seen.  Surgical staple projects inferior to the right SI joint.  IMPRESSION: Acute fractures of the right pubic ramus and right ischial ramus.  No dislocation.  No hip fracture or dislocation is evident. Osteopenic appearance of bones.  Other stable chronic findings are detailed above.   Original Report Authenticated By: Onalee Hua Call     EKG: Independently reviewed.  Assessment/Plan Principal Problem:  *Fracture of multiple pubic rami: secondary to mechanical fall. Admit to med/surg. Provide pain med and anti-emetic as needed. Orthopedics consulted by ED MD recommending non-wt bearing and likely rehab. Appreciate ortho following.  Active Problems:  HYPOTHYROIDISM: TSH 8.8 in 6/13. Will check TSH and free T4. Of note in June of this year pt hospitalized. Will continue home meds  ANEMIA, likely due chronic disease. Hg 9.6. Chart review indicates baseline 9-10. No s/sx bleeding. Will monitor.   Atrial fibrillation: rate controlled. Not on coumadin. Will continue home meds  HEART FAILURE: appears compensated. Will monitor intake and output. Will get daily wts. Continue home meds. Echo in 2011 yields EF 30-35% with grade 1 diastolic dysfunction  GERD: at baseline. Continue PPI  Chronic obstructive asthma: at baseline. On home 02. Monitor sats. Continue home meds.   Code Status: DNR Family Communication: husband at bedside Disposition Plan: will likely need rehab before returning to independent living facility  Time spent: 45 minutes  Gwenyth Bender NP [facility]   If 7PM-7AM,  please contact night-coverage [email or web address] Password [email or web address] 04/19/2012, 1:56 PM

## 2012-04-19 NOTE — ED Notes (Signed)
Per EMS pt from home, was walking to open front door and tripped over nasal cannula. Pt c/o right sided groin pain. No LOC. No neck/back pain. Pt has rug burn to right elbow. VSS.

## 2012-04-19 NOTE — H&P (Signed)
Patient seen and examined by me.  Agree with plan. Patient requesting a PICC Line- spoke with her about risks including clots.  Await ortho recommendations.  Wound care consult for stable abdominal wound  Marlin Canary DO

## 2012-04-19 NOTE — ED Notes (Signed)
MD at bedside. 

## 2012-04-19 NOTE — ED Provider Notes (Signed)
History     CSN: [id number]  Arrival date & time 04/19/12  1100   First MD Initiated Contact with Patient 04/19/12 1119      Chief Complaint  Patient presents with  . Fall    (Consider location/radiation/quality/duration/timing/severity/associated sxs/prior treatment) HPI Comments: Patient presents via EMS after mechanical fall. She was getting out of bed and tripped on her oxygen tubing and landed on the floor on her right side. She denies hitting her head or losing consciousness. He complains of pain in her right hip and groin. She denies any chest pain, shortness of breath, headache or neck pain. She denies any back pain. She complains of pain in her right inguinal area. She has a history of a colostomy and a nonhealing abdominal wound that is followed by the wound Center.  The history is provided by the patient and the EMS personnel.    Past Medical History  Diagnosis Date  . Insomnia, unspecified   . Osteoarthrosis, unspecified whether generalized or localized, unspecified site   . Unspecified hypothyroidism   . Unspecified essential hypertension   . Atrial fibrillation   . Sciatica   . Anemia   . COPD (chronic obstructive pulmonary disease)   . UTI (urinary tract infection)   . GERD (gastroesophageal reflux disease)   . Pneumonia   . Asthma   . Clostridium difficile colitis   . Diverticulitis of sigmoid colon 11/2010    with abscess/contained perforation  . Dementia     mild  . Dehydration with hyponatremia 12/2010    hospitalized  . Angiodysplasia of stomach     ablated 2010  . Angiodysplasia of colon     ablated 2010  . Vitamin B12 deficiency (dietary) anemia 04/12/2011  . Shingles   . Sarcoma     right hand  . Malignant neoplasm of breast (female), unspecified site     left  . Dupuytren's disease     left hand  . Vitamin B12 deficiency 04/02/2012    Past Surgical History  Procedure Date  . Nasal sinus surgery     x 3  . Appendectomy   . Hernia  repair   . Abdominal hysterectomy   . Pacemaker insertion   . Eye surgery     x6-bilateral  . Finger amputation     right  . Foot surgery     right toes x 3  . Tonsillectomy   . Colonoscopy 01/2009    w/ablation of 3 mm AVM, diverticulosis, external hemorrhoids  . Upper gastrointestinal endoscopy 01/2009    w/polypectomy, AVM, hyperplastic polyp  . Abdominal hysterectomy   . Laparotomy 11/12/2011    Procedure: EXPLORATORY LAPAROTOMY;  Surgeon: Lodema Pilot, DO;  Location: [facility];  Service: General;  Laterality: N/A;  . Colostomy revision 11/12/2011    Procedure: COLON RESECTION SIGMOID;  Surgeon: Lodema Pilot, DO;  Location: [facility];  Service: General;  Laterality: N/A;  . Colostomy 11/12/2011    Procedure: COLOSTOMY;  Surgeon: Lodema Pilot, DO;  Location: [facility];  Service: General;  Laterality: Left;  End colostomy  . Laparotomy 11/19/2011    Procedure: EXPLORATORY LAPAROTOMY;  Surgeon: Kandis Cocking, MD;  Location: [facility];  Service: General;  Laterality: N/A;  . Gastrostomy 11/19/2011    Procedure: GASTROSTOMY;  Surgeon: Kandis Cocking, MD;  Location: [facility];  Service: General;;  INSERTION GASTROSTOMY TUBE  . Application of wound vac 11/19/2011    Procedure: APPLICATION OF WOUND VAC;  Surgeon: Kandis Cocking, MD;  Location: [facility];  Service: General;  Laterality: N/A;    Family History  Problem Relation Age of Onset  . Emphysema Paternal Uncle   . Heart disease Mother   . Colon cancer Mother   . Pancreatic cancer Father   . Cancer Father 44    pancreatic cancer  . Tuberculosis Paternal Uncle   . Breast cancer Maternal Aunt   . Breast cancer Cousin   . Prostate cancer Cousin   . Colon cancer Cousin   . Uterine cancer Cousin     History  Substance Use Topics  . Smoking status: Former Smoker -- 0.5 packs/day for 21 years    Types: Cigarettes    Quit date: 06/20/1977  . Smokeless tobacco: Never Used  . Alcohol Use: Yes     1 glass wine/night    OB History    Grav Para Term  Preterm Abortions TAB SAB Ect Mult Living                  Review of Systems  Constitutional: Negative for fever.  HENT: Negative for congestion, rhinorrhea and neck pain.   Respiratory: Negative for cough, chest tightness and shortness of breath.   Cardiovascular: Negative for chest pain.  Gastrointestinal: Negative for nausea, vomiting and abdominal pain.  Genitourinary: Negative for dysuria.  Musculoskeletal: Positive for myalgias and arthralgias. Negative for back pain.  Skin: Positive for wound.  Neurological: Negative for headaches.    Allergies  Aspirin; Ibuprofen; Iron dextran; Metoclopramide hcl; and Sulfonamide derivatives  Home Medications   No current outpatient prescriptions on file.  BP 118/59  Pulse 86  Temp 98.5 F (36.9 C) (Oral)  Resp 15  SpO2 100%  Physical Exam  Constitutional: She is oriented to person, place, and time. She appears well-developed and well-nourished. No distress.  HENT:  Head: Normocephalic and atraumatic.  Mouth/Throat: Oropharynx is clear and moist. No oropharyngeal exudate.  Eyes: Conjunctivae normal and EOM are normal. Pupils are equal, round, and reactive to light.  Neck: Normal range of motion. Neck supple.  Cardiovascular: Normal rate, regular rhythm and normal heart sounds.   No murmur heard. Pulmonary/Chest: Effort normal and breath sounds normal. No respiratory distress.  Abdominal: Soft. There is no tenderness. There is no rebound and no guarding.       Left lower quadrant colostomy with gas and stool. Midline open abdominal wound with dressing in place  Musculoskeletal: Normal range of motion. She exhibits tenderness.       TTP R lateral hip.  Pain with external rotation. No lumbar, sa acral or thoracic tenderness. Equal grip strentgth. Skin tear R elbow.  No bony tenderness  Neurological: She is alert and oriented to person, place, and time. No cranial nerve deficit.  Skin: Skin is warm.    ED Course  Procedures  (including critical care time)   Labs Reviewed  TSH  T4, FREE  COMPREHENSIVE METABOLIC PANEL  CBC   Dg Chest 2 View  04/19/2012  *RADIOLOGY REPORT*  Clinical Data: Fall, shortness of breath  CHEST - 2 VIEW  Comparison: 09/03/2011  Findings: Left upper lobe calcified granuloma.  Chronic interstitial markings.  No pleural effusion or pneumothorax.  Cardiomediastinal silhouette is within normal limits.  Left subclavian pacemaker.  Surgical clips overlying the left chest wall.  Right lateral rib fracture deformities.  IMPRESSION: No evidence of acute cardiopulmonary disease.   Original Report Authenticated By: Charline Bills, M.D.    Dg Elbow Complete Right  04/19/2012  *RADIOLOGY REPORT*  Clinical Data: History of injury from fall with posterior elbow the soft tissue laceration.  RIGHT ELBOW - COMPLETE 3+ VIEW  Comparison: None.  Findings: There is no positive fat pad sign to suggest joint effusion.  Alignment is normal.  No fracture or bony destruction is evident.  There is minimal spurring of the coronoid process of the proximal ulna.  There is slight degenerative roughening of the lateral epicondyle of the distal humerus.  A tiny calcific density is seen adjacent to the radial head.  This may reflect previous trauma.  A tiny loose body or previous chip fracture cannot be excluded.  IMPRESSION: History given of injury of the posterior aspect of the elbow with soft tissue laceration.  No fracture of the distal humerus or olecranon is seen.  No dislocation is evident.  There is no evidence of joint effusion.  There is slight degenerative roughening of the lateral epicondyle of the distal humerus. There is minimal beginning spurring of the coronoid process of the proximal ulna.  A tiny calcific density is seen adjacent to the radial head.  This may reflect previous trauma.  A tiny loose body or previous chip fracture cannot be excluded.   Original Report Authenticated By: Onalee Hua Call    Dg Hip Complete  Right  04/19/2012  *RADIOLOGY REPORT*  Clinical Data: History of injury from fall with pain in the right inguinal region.  RIGHT HIP - COMPLETE 2+ VIEW  Comparison: CT 12/03/2010.  Findings: There is acute fracture of the medial aspect of the right pubic bone with inferior displacement of the medial distal fracture fragment.  There is a hairline fracture through the inferior aspect of the right ischial ramus.  No dislocation is evident. No hip fracture or dislocation is evident.  There is chronic enthesophyte spurring of the greater trochanter area bilaterally which is chronic.  There is scoliosis.  There is osteopenic appearance of bones.  There is degenerative spondylosis.  Pelvic phleboliths are seen.  Surgical staple projects inferior to the right SI joint.  IMPRESSION: Acute fractures of the right pubic ramus and right ischial ramus.  No dislocation.  No hip fracture or dislocation is evident. Osteopenic appearance of bones.  Other stable chronic findings are detailed above.   Original Report Authenticated By: Onalee Hua Call      1. Multiple pelvic fractures   2. Chronic obstructive asthma   3. History of hypertension   4. Unspecified hypothyroidism       MDM  Mechanical fall with right hip and elbow pain. Did not hit head or lose consciousness. No use of anticoagulants.  X-ray remarkable for pelvic fractures. Discussed with patient. Discussed with Dr. August Saucer orthopedics who knows patient well. He recommends nonweightbearing and admission given her multiple medical issues  Patient will be admitted given her pelvic fractures, need for oxygen, colostomy and wound dehiscence issues. Her husband cannot care for her at home. Her pain used to control the hospital. Dr. August Saucer will consult on patient.    Glynn Octave, MD 04/19/12 [phone]

## 2012-04-19 NOTE — ED Notes (Signed)
Report given to 5N 

## 2012-04-19 NOTE — ED Notes (Signed)
Patient transported to X-ray 

## 2012-04-19 NOTE — Consult Note (Signed)
Reason for Consult: Hip pain right Referring Physician: Dr. Arthur [name] is an 78 y.o. female.  HPI: [name] hip is a 78 year old female who is granulating in her house today with a walker when she tripped over the oxygen cord that she was trying to get the door. She fell on the right side. She had immediate onset of right hip pain and was unable to ambulate or weight-bear on the right-hand side. She denies loss of consciousness and denies any other orthopedic complaints. She is currently nonpigmented but is in process of being treated for wound dehiscence of abdominal wound from diverticulitis surgery in April. She denies any numbness or tingling in the leg.  Past Medical History  Diagnosis Date  . Insomnia, unspecified   . Osteoarthrosis, unspecified whether generalized or localized, unspecified site   . Unspecified hypothyroidism   . Unspecified essential hypertension   . Atrial fibrillation   . Sciatica   . Anemia   . COPD (chronic obstructive pulmonary disease)   . UTI (urinary tract infection)   . GERD (gastroesophageal reflux disease)   . Pneumonia   . Asthma   . Clostridium difficile colitis   . Diverticulitis of sigmoid colon 11/2010    with abscess/contained perforation  . Dementia     mild  . Dehydration with hyponatremia 12/2010    hospitalized  . Angiodysplasia of stomach     ablated 2010  . Angiodysplasia of colon     ablated 2010  . Vitamin B12 deficiency (dietary) anemia 04/12/2011  . Shingles   . Sarcoma     right hand  . Malignant neoplasm of breast (female), unspecified site     left  . Dupuytren's disease     left hand  . Vitamin B12 deficiency 04/02/2012    Past Surgical History  Procedure Date  . Nasal sinus surgery     x 3  . Appendectomy   . Hernia repair   . Abdominal hysterectomy   . Pacemaker insertion   . Eye surgery     x6-bilateral  . Finger amputation     right  . Foot surgery     right toes x 3  . Tonsillectomy   . Colonoscopy  01/2009    w/ablation of 3 mm AVM, diverticulosis, external hemorrhoids  . Upper gastrointestinal endoscopy 01/2009    w/polypectomy, AVM, hyperplastic polyp  . Abdominal hysterectomy   . Laparotomy 11/12/2011    Procedure: EXPLORATORY LAPAROTOMY;  Surgeon: Lodema Pilot, DO;  Location: [facility];  Service: General;  Laterality: N/A;  . Colostomy revision 11/12/2011    Procedure: COLON RESECTION SIGMOID;  Surgeon: Lodema Pilot, DO;  Location: [facility];  Service: General;  Laterality: N/A;  . Colostomy 11/12/2011    Procedure: COLOSTOMY;  Surgeon: Lodema Pilot, DO;  Location: [facility];  Service: General;  Laterality: Left;  End colostomy  . Laparotomy 11/19/2011    Procedure: EXPLORATORY LAPAROTOMY;  Surgeon: Kandis Cocking, MD;  Location: [facility];  Service: General;  Laterality: N/A;  . Gastrostomy 11/19/2011    Procedure: GASTROSTOMY;  Surgeon: Kandis Cocking, MD;  Location: [facility];  Service: General;;  INSERTION GASTROSTOMY TUBE  . Application of wound vac 11/19/2011    Procedure: APPLICATION OF WOUND VAC;  Surgeon: Kandis Cocking, MD;  Location: [facility];  Service: General;  Laterality: N/A;    Family History  Problem Relation Age of Onset  . Emphysema Paternal Uncle   . Heart disease Mother   . Colon cancer  Mother   . Pancreatic cancer Father   . Cancer Father 27    pancreatic cancer  . Tuberculosis Paternal Uncle   . Breast cancer Maternal Aunt   . Breast cancer Cousin   . Prostate cancer Cousin   . Colon cancer Cousin   . Uterine cancer Cousin     Social History:  reports that she quit smoking about 34 years ago. Her smoking use included Cigarettes. She has a 10.5 pack-year smoking history. She has never used smokeless tobacco. She reports that she drinks alcohol. She reports that she does not use illicit drugs.  Allergies:  Allergies  Allergen Reactions  . Aspirin     "put me in the hospital"  . Ibuprofen   . Iron Dextran     REACTION: itching after test dose  . Metoclopramide Hcl      REACTION: chomping mouth  . Sulfonamide Derivatives Hives    Medications: I have reviewed the patient's current medications.  Results for orders placed during the hospital encounter of 04/19/12 (from the past 48 hour(s))  COMPREHENSIVE METABOLIC PANEL     Status: Abnormal   Collection Time   04/19/12  3:51 PM      Component Value Range Comment   Sodium 138  135 - 145 mEq/L    Potassium 4.0  3.5 - 5.1 mEq/L    Chloride 102  96 - 112 mEq/L    CO2 28  19 - 32 mEq/L    Glucose, Bld 92  70 - 99 mg/dL    BUN 16  6 - 23 mg/dL    Creatinine, Ser 1.61  0.50 - 1.10 mg/dL    Calcium 9.5  8.4 - 09.6 mg/dL    Total Protein 6.0  6.0 - 8.3 g/dL    Albumin 3.3 (*) 3.5 - 5.2 g/dL    AST 32  0 - 37 U/L    ALT 22  0 - 35 U/L    Alkaline Phosphatase 60  39 - 117 U/L    Total Bilirubin 0.3  0.3 - 1.2 mg/dL    GFR calc non Af Amer 61 (*) >90 mL/min    GFR calc Af Amer 70 (*) >90 mL/min   CBC     Status: Abnormal   Collection Time   04/19/12  3:51 PM      Component Value Range Comment   WBC 11.8 (*) 4.0 - 10.5 K/uL    RBC 4.16  3.87 - 5.11 MIL/uL    Hemoglobin 12.4  12.0 - 15.0 g/dL    HCT 04.5  40.9 - 81.1 %    MCV 91.3  78.0 - 100.0 fL    MCH 29.8  26.0 - 34.0 pg    MCHC 32.6  30.0 - 36.0 g/dL    RDW 91.4  78.2 - 95.6 %    Platelets 157  150 - 400 K/uL     Dg Chest 2 View  04/19/2012  *RADIOLOGY REPORT*  Clinical Data: Fall, shortness of breath  CHEST - 2 VIEW  Comparison: 09/03/2011  Findings: Left upper lobe calcified granuloma.  Chronic interstitial markings.  No pleural effusion or pneumothorax.  Cardiomediastinal silhouette is within normal limits.  Left subclavian pacemaker.  Surgical clips overlying the left chest wall.  Right lateral rib fracture deformities.  IMPRESSION: No evidence of acute cardiopulmonary disease.   Original Report Authenticated By: Charline Bills, M.D.    Dg Elbow Complete Right  04/19/2012  *RADIOLOGY REPORT*  Clinical Data: History of  injury from fall with  posterior elbow the soft tissue laceration.  RIGHT ELBOW - COMPLETE 3+ VIEW  Comparison: None.  Findings: There is no positive fat pad sign to suggest joint effusion.  Alignment is normal.  No fracture or bony destruction is evident.  There is minimal spurring of the coronoid process of the proximal ulna.  There is slight degenerative roughening of the lateral epicondyle of the distal humerus.  A tiny calcific density is seen adjacent to the radial head.  This may reflect previous trauma.  A tiny loose body or previous chip fracture cannot be excluded.  IMPRESSION: History given of injury of the posterior aspect of the elbow with soft tissue laceration.  No fracture of the distal humerus or olecranon is seen.  No dislocation is evident.  There is no evidence of joint effusion.  There is slight degenerative roughening of the lateral epicondyle of the distal humerus. There is minimal beginning spurring of the coronoid process of the proximal ulna.  A tiny calcific density is seen adjacent to the radial head.  This may reflect previous trauma.  A tiny loose body or previous chip fracture cannot be excluded.   Original Report Authenticated By: Onalee Hua Call    Dg Hip Complete Right  04/19/2012  *RADIOLOGY REPORT*  Clinical Data: History of injury from fall with pain in the right inguinal region.  RIGHT HIP - COMPLETE 2+ VIEW  Comparison: CT 12/03/2010.  Findings: There is acute fracture of the medial aspect of the right pubic bone with inferior displacement of the medial distal fracture fragment.  There is a hairline fracture through the inferior aspect of the right ischial ramus.  No dislocation is evident. No hip fracture or dislocation is evident.  There is chronic enthesophyte spurring of the greater trochanter area bilaterally which is chronic.  There is scoliosis.  There is osteopenic appearance of bones.  There is degenerative spondylosis.  Pelvic phleboliths are seen.  Surgical staple projects inferior to the  right SI joint.  IMPRESSION: Acute fractures of the right pubic ramus and right ischial ramus.  No dislocation.  No hip fracture or dislocation is evident. Osteopenic appearance of bones.  Other stable chronic findings are detailed above.   Original Report Authenticated By: Onalee Hua Call     Review of Systems  Constitutional: Negative.   HENT: Negative.   Eyes: Negative.   Respiratory: Positive for wheezing.   Cardiovascular: Negative.   Gastrointestinal: Positive for abdominal pain.  Genitourinary: Negative.   Musculoskeletal: Positive for joint pain.  Skin: Negative.   Neurological: Negative.   Endo/Heme/Allergies: Negative.   Psychiatric/Behavioral: Negative.    Blood pressure 136/58, pulse 73, temperature 98.7 F (37.1 C), temperature source Oral, resp. rate 18, SpO2 95.00%. Physical Exam  Constitutional: She appears well-developed.  HENT:  Head: Normocephalic.  Eyes: Pupils are equal, round, and reactive to light.  Neck: Normal range of motion.  Cardiovascular: Normal rate.   Respiratory: Effort normal.  GI: Soft.   examination of the lower extremities demonstrates no crepitus or effusion the ankle her knees bilaterally chest bilateral pedal pulses palpable for dorsalis and plantarflexion quadrant entering strength mild pain with right hip range of motion of pain with left hip range of motion no bruising noted yet in the hip region she has an abrasion over the right elbow but full range of motion of the elbow wrist and shoulders bilaterally active and passive  Assessment/Plan: Radiographs reviewed show right-sided ramus initial tuberosity fracture impression right pelvic fracture  the patient is notably debilitated he has a husband who just underwent LVAD surgery. She can make weightbearing as tolerated on the left-hand side touch down weightbearing as tolerated on the right-hand side as pain allows. Apparently the fracture is stable but she will not be able to walk or ambulate for  approximately 2-3 weeks. Mobilization in the bed will also be painful for several days until some fracture consolidation occurs. Percocet has made her dizzy this afternoon morphine IV is working better. She may require one to 2-3 days of pain control before she is able to before she is able to go to a skilled nursing facility anticipate skilled nursing facility stay of approximately 2-3 week to 3 weeks following discharge all questions answered.   [name] 04/19/2012, 10:21 PM

## 2012-04-20 ENCOUNTER — Inpatient Hospital Stay (HOSPITAL_COMMUNITY): Payer: Medicare Other

## 2012-04-20 ENCOUNTER — Encounter (HOSPITAL_COMMUNITY): Payer: Self-pay | Admitting: General Practice

## 2012-04-20 LAB — TSH: TSH: 2.93 u[IU]/mL (ref 0.350–4.500)

## 2012-04-20 LAB — URINALYSIS, ROUTINE W REFLEX MICROSCOPIC
Hgb urine dipstick: NEGATIVE
Leukocytes, UA: NEGATIVE
Nitrite: NEGATIVE
Protein, ur: NEGATIVE mg/dL
Specific Gravity, Urine: 1.023 (ref 1.005–1.030)
Urobilinogen, UA: 0.2 mg/dL (ref 0.0–1.0)

## 2012-04-20 LAB — BASIC METABOLIC PANEL
Calcium: 9.3 mg/dL (ref 8.4–10.5)
Creatinine, Ser: 0.92 mg/dL (ref 0.50–1.10)
GFR calc Af Amer: 68 mL/min — ABNORMAL LOW (ref >90)

## 2012-04-20 LAB — CBC
Platelets: 144 10*3/uL — ABNORMAL LOW (ref 150–400)
RDW: 14 % (ref 11.5–15.5)
WBC: 8.3 10*3/uL (ref 4.0–10.5)

## 2012-04-20 LAB — T4, FREE: Free T4: 1.15 ng/dL (ref 0.80–1.80)

## 2012-04-20 LAB — MRSA PCR SCREENING: MRSA by PCR: NEGATIVE

## 2012-04-20 LAB — TROPONIN I: Troponin I: <0.3 ng/mL (ref <0.30)

## 2012-04-20 MED ORDER — POLYETHYLENE GLYCOL 3350 17 G PO PACK
17.0000 g | PACK | Freq: Every day | ORAL | Status: DC
  Administered 2012-04-20 – 2012-04-23 (×4): 17 g via ORAL
  Filled 2012-04-20 (×4): qty 1

## 2012-04-20 MED ORDER — SENNOSIDES-DOCUSATE SODIUM 8.6-50 MG PO TABS
1.0000 | ORAL_TABLET | Freq: Two times a day (BID) | ORAL | Status: DC
  Administered 2012-04-20 – 2012-04-23 (×6): 1 via ORAL
  Filled 2012-04-20 (×5): qty 1

## 2012-04-20 NOTE — Evaluation (Signed)
Physical Therapy Evaluation Patient Details Name: [name] MRN: [id number] DOB: Sep 20, 1933 Today's Date: 04/20/2012 Time: 4098-1191 PT Time Calculation (min): 28 min  PT Assessment / Plan / Recommendation Clinical Impression  Pt admitted s/p fall with bilateral pubic ramii fxs. Pt extremely limited by pain with all movements. No WB orders although according to Dr. Diamantina Providence note pt is WBAT on LLE and TDWB on RLE, clarification needed prior to weightbearing. Pt will benefit from skilled PT in the acute care setting in order to maximize functional mobility prior to d/c    PT Assessment  Patient needs continued PT services    Follow Up Recommendations  Post acute inpatient    Does the patient have the potential to tolerate intense rehabilitation   No, Recommend SNF  Barriers to Discharge        Equipment Recommendations  None recommended by PT    Recommendations for Other Services     Frequency Min 4X/week    Precautions / Restrictions Precautions Precautions: None Restrictions Weight Bearing Restrictions: Yes RLE Weight Bearing: Touchdown weight bearing LLE Weight Bearing: Weight bearing as tolerated   Pertinent Vitals/Pain Pain 7/10      Mobility  Bed Mobility Bed Mobility: Rolling Left;Scooting to HOB Rolling Left: 5: Supervision Scooting to [facility]: 7: Independent Details for Bed Mobility Assistance: Pt resistant to movements secondary to pain. Able to roll majority of the way left holding rail. Pt able to go from supine to long sitting independently Transfers Transfers: Not assessed Ambulation/Gait Ambulation/Gait Assistance: Not tested (comment)    Shoulder Instructions     Exercises General Exercises - Lower Extremity Heel Slides: AROM;Strengthening;Both;10 reps;Seated   PT Diagnosis: Acute pain;Difficulty walking  PT Problem List: Decreased strength;Decreased activity tolerance;Decreased mobility;Decreased safety awareness;Decreased knowledge of  precautions;Pain PT Treatment Interventions: Functional mobility training;Therapeutic activities;Therapeutic exercise;Gait training;DME instruction;Patient/family education   PT Goals Acute Rehab PT Goals PT Goal Formulation: With patient Time For Goal Achievement: 04/27/12 Potential to Achieve Goals: Fair Pt will go Supine/Side to Sit: with modified independence PT Goal: Supine/Side to Sit - Progress: Goal set today Pt will go Sit to Supine/Side: with modified independence PT Goal: Sit to Supine/Side - Progress: Goal set today Pt will go Sit to Stand: with supervision PT Goal: Sit to Stand - Progress: Goal set today Pt will go Stand to Sit: with supervision PT Goal: Stand to Sit - Progress: Goal set today Pt will Transfer Bed to Chair/Chair to Bed: with min assist PT Transfer Goal: Bed to Chair/Chair to Bed - Progress: Goal set today  Visit Information  Last PT Received On: 04/20/12 Assistance Needed: +2 PT/OT Co-Evaluation/Treatment: Yes    Subjective Data  Subjective: "It hurts to move every position" Patient Stated Goal: to be able to walk again   Prior Functioning  Home Living Lives With: Spouse Available Help at Discharge: Family;Available 24 hours/day (husband recently had LVAD surgery) Type of Home: House Home Access: Level entry Home Layout: One level Bathroom Shower/Tub: Walk-in shower;Door [employer] Toilet: Handicapped height Bathroom Accessibility: Yes How Accessible: Accessible via walker Home Adaptive Equipment: Shower chair with back;Bedside commode/3-in-1;Grab bars around toilet;Grab bars in shower;Walker - rolling;Straight cane Prior Function Level of Independence: Independent with assistive device(s) Able to Take Stairs?: No Driving: Yes Comments: uses RW at all times Communication Communication: No difficulties Dominant Hand: Right    Cognition  Overall Cognitive Status: Appears within functional limits for tasks  assessed/performed Arousal/Alertness: Awake/alert Orientation Level: Appears intact for tasks assessed Behavior During Session: [facility] for tasks  performed    Extremity/Trunk Assessment Right Upper Extremity Assessment RUE ROM/Strength/Tone: [facility] for tasks assessed Left Upper Extremity Assessment LUE ROM/Strength/Tone: [facility] for tasks assessed Right Lower Extremity Assessment RLE ROM/Strength/Tone: Deficits;Unable to fully assess;Due to pain RLE ROM/Strength/Tone Deficits: Knees and Ankle WFL. Unable to assess hip secondary to pain Left Lower Extremity Assessment LLE ROM/Strength/Tone: Unable to fully assess;Due to pain;Deficits LLE ROM/Strength/Tone Deficits: Knees and Ankle WFL. Unable to assess hip secondary to pain   Balance    End of Session PT - End of Session Equipment Utilized During Treatment: Oxygen Activity Tolerance: Patient limited by pain Patient left: in bed;with call bell/phone within reach;with bed alarm set Nurse Communication: Mobility status;Weight bearing status  GP     Milana Kidney 04/20/2012, 6:19 PM  04/20/2012 Milana Kidney DPT PAGER: [phone] OFFICE: [phone]

## 2012-04-20 NOTE — Progress Notes (Signed)
Pt stated that she was NWB and supposed to be on bed rest for "weeks" before beginning to walk again. Spoke with MD August Saucer about weight bearing status. Pt is WBAT on left side and PWB on right side per MD.

## 2012-04-20 NOTE — Progress Notes (Signed)
[facility] PROGRESS NOTE  Maddy Stapel WJX:[id number] DOB: [birth year] DOA: 04/19/2012 PCP: [name], MD  Assessment/Plan: Principal Problem:  *Fracture of multiple pubic rami Active Problems:  HYPOTHYROIDISM  ANEMIA, SECONDARY TO CHRONIC BLOOD LOSS  Atrial fibrillation  HEART FAILURE  GERD  Chronic obstructive asthma    Fracture multiple pubic Rami, orthopedics recommends pain control all surgery at this point, orthopedics recommends SNF  HYPOTHYROIDISM: TSH 8.8 in 6/13. Will check TSH and free T4. Of note in June of this year pt hospitalized. Will continue home meds  ANEMIA, likely due chronic disease. Hg 9.6. Chart review indicates baseline 9-10. No s/sx bleeding. Will monitor.  Atrial fibrillation: rate controlled. Not on coumadin. Will continue home meds  HEART FAILURE: appears compensated. Will monitor intake and output. Will get daily wts. Continue home meds. Echo in 2011 yields EF 30-35% with grade 1 diastolic dysfunction  GERD: at baseline. Continue PPI  Chronic obstructive asthma: at baseline. On home 02. Monitor sats. Continue home meds   Code Status: full Family Communication: family updated about patient's clinical progress Disposition Plan:  As above    Brief narrative: [name] is a 78 y.o. female With past medical hx including but not limited to COPD, afib nit on coumadin, HTN, anemia, diverticulitis with abscess/contained perforation, breast cancer presents to ED cc fall. Information obtained from patient. Reports that she was "racing" to answer the door and her foot caught on her 02 tubing and she fell straight down. She did not lose consciousness, did not hit head. Reports feeling pain in right inguinal area. Describes pain as constant worse with moving and better if lying still. Pain medicine also helps pain. Characterizes pain as moderate to severe. She states she has been in her usual state of health. No recent illnesses, no chest pain, no fever,  chills. Work up in the ED yields fx pubic rami. We are asked to admit for further eval and treatment.   Consultants:  Orthopedics  Procedures:  None  Antibiotics:  None  HPI/Subjective: Stable overnight  Objective: Filed Vitals:   04/19/12 1130 04/19/12 1426 04/19/12 2213 04/20/12 0519  BP: 132/61 118/59 136/58 157/69  Pulse: 77 86 73 83  Temp:  98.5 F (36.9 C) 98.7 F (37.1 C) 99.3 F (37.4 C)  TempSrc:      Resp: 25 15 18 18   SpO2: 98% 100% 95% 100%    Intake/Output Summary (Last 24 hours) at 04/20/12 0844 Last data filed at 04/19/12 1700  Gross per 24 hour  Intake      0 ml  Output    300 ml  Net   -300 ml    Exam:  General: Awake alert oriented x3  Eyes: glasses, PERRL EOMI  ENT: mucus membranes of nose/mouth pink/moist  Neck: supple No JVD No lymphadenopathy  Cardiovascular: RRR No MGR, No LEE PPP  Respiratory: normal effort. BSCTAB No wheeze/rhonchi  Abdomen: obese, soft, left lower quadrant colostomy with small amount light brown soft stool. Midline abdominal dressing dry and intact.  Skin: warm and dry. Dressing to R elbow dry and intact covering abrasion  Musculoskeletal: MOE albeit slowly with LE due to pain. No joint swelling/erythema  Psychiatric: appropriate cooperative  Neurologic: cranial nerve II-XII intact speech clear facial symmetry    Data Reviewed: Basic Metabolic Panel:  Lab 04/20/12 2130 04/19/12 1551  NA 135 138  K 4.0 4.0  CL 101 102  CO2 26 28  GLUCOSE 101* 92  BUN 13 16  CREATININE 0.92  0.90  CALCIUM 9.3 9.5  MG -- --  PHOS -- --    Liver Function Tests:  Lab 04/19/12 1551  AST 32  ALT 22  ALKPHOS 60  BILITOT 0.3  PROT 6.0  ALBUMIN 3.3*   No results found for this basename: LIPASE:5,AMYLASE:5 in the last 168 hours No results found for this basename: AMMONIA:5 in the last 168 hours  CBC:  Lab 04/20/12 0655 04/19/12 1551  WBC 8.3 11.8*  NEUTROABS -- --  HGB 10.9* 12.4  HCT 34.1* 38.0  MCV 91.4 91.3    PLT 144* 157    Cardiac Enzymes: No results found for this basename: CKTOTAL:5,CKMB:5,CKMBINDEX:5,TROPONINI:5 in the last 168 hours BNP (last 3 results)  Basename 12/03/11 0435 11/16/11 0605 11/14/11 0500  PROBNP 2410.0* >70000.0* 11055.0*     CBG: No results found for this basename: GLUCAP:5 in the last 168 hours  No results found for this or any previous visit (from the past 240 hour(s)).   Studies: Dg Chest 2 View  04/19/2012  *RADIOLOGY REPORT*  Clinical Data: Fall, shortness of breath  CHEST - 2 VIEW  Comparison: 09/03/2011  Findings: Left upper lobe calcified granuloma.  Chronic interstitial markings.  No pleural effusion or pneumothorax.  Cardiomediastinal silhouette is within normal limits.  Left subclavian pacemaker.  Surgical clips overlying the left chest wall.  Right lateral rib fracture deformities.  IMPRESSION: No evidence of acute cardiopulmonary disease.   Original Report Authenticated By: Charline Bills, M.D.    Dg Elbow Complete Right  04/19/2012  *RADIOLOGY REPORT*  Clinical Data: History of injury from fall with posterior elbow the soft tissue laceration.  RIGHT ELBOW - COMPLETE 3+ VIEW  Comparison: None.  Findings: There is no positive fat pad sign to suggest joint effusion.  Alignment is normal.  No fracture or bony destruction is evident.  There is minimal spurring of the coronoid process of the proximal ulna.  There is slight degenerative roughening of the lateral epicondyle of the distal humerus.  A tiny calcific density is seen adjacent to the radial head.  This may reflect previous trauma.  A tiny loose body or previous chip fracture cannot be excluded.  IMPRESSION: History given of injury of the posterior aspect of the elbow with soft tissue laceration.  No fracture of the distal humerus or olecranon is seen.  No dislocation is evident.  There is no evidence of joint effusion.  There is slight degenerative roughening of the lateral epicondyle of the distal  humerus. There is minimal beginning spurring of the coronoid process of the proximal ulna.  A tiny calcific density is seen adjacent to the radial head.  This may reflect previous trauma.  A tiny loose body or previous chip fracture cannot be excluded.   Original Report Authenticated By: Onalee Hua Call    Dg Hip Complete Right  04/19/2012  *RADIOLOGY REPORT*  Clinical Data: History of injury from fall with pain in the right inguinal region.  RIGHT HIP - COMPLETE 2+ VIEW  Comparison: CT 12/03/2010.  Findings: There is acute fracture of the medial aspect of the right pubic bone with inferior displacement of the medial distal fracture fragment.  There is a hairline fracture through the inferior aspect of the right ischial ramus.  No dislocation is evident. No hip fracture or dislocation is evident.  There is chronic enthesophyte spurring of the greater trochanter area bilaterally which is chronic.  There is scoliosis.  There is osteopenic appearance of bones.  There is degenerative spondylosis.  Pelvic phleboliths are seen.  Surgical staple projects inferior to the right SI joint.  IMPRESSION: Acute fractures of the right pubic ramus and right ischial ramus.  No dislocation.  No hip fracture or dislocation is evident. Osteopenic appearance of bones.  Other stable chronic findings are detailed above.   Original Report Authenticated By: Onalee Hua Call     Scheduled Meds:   . sodium chloride   Intravenous STAT  . albuterol  2 puff Inhalation BID  . DULoxetine  30 mg Oral Daily  . enoxaparin (LOVENOX) injection  40 mg Subcutaneous Q24H  . fluticasone  1 spray Each Nare Daily  . fluticasone  2 puff Inhalation BID  .  HYDROmorphone (DILAUDID) injection  1 mg Intramuscular Once  . levothyroxine  50 mcg Oral QAC breakfast  . losartan  50 mg Oral Daily  . magnesium oxide  400 mg Oral Daily  . metoprolol succinate  25 mg Oral Daily  . montelukast  10 mg Oral QHS  . multivitamin with minerals  1 tablet Oral Daily  .  ondansetron  4 mg Oral Once  . pantoprazole  40 mg Oral Daily  . polyvinyl alcohol  2 drop Both Eyes BID  . predniSONE  7.5 mg Oral Q breakfast  . simvastatin  20 mg Oral q1800  . DISCONTD: carvedilol  3.125 mg Oral BID WC  . DISCONTD: fentaNYL  100 mcg Intravenous Once  . DISCONTD: hydroxypropyl methylcellulose  2 drop Both Eyes BID  . DISCONTD: magnesium oxide  400 mg Oral Daily  . DISCONTD: metoprolol tartrate  25 mg Oral BID  . DISCONTD: mirtazapine  30 mg Oral QHS   Continuous Infusions:   Principal Problem:  *Fracture of multiple pubic rami Active Problems:  HYPOTHYROIDISM  ANEMIA, SECONDARY TO CHRONIC BLOOD LOSS  Atrial fibrillation  HEART FAILURE  GERD  Chronic obstructive asthma    Time spent: 40 minutes   [facility]  [facility] Pager [phone]. If 8PM-8AM, please contact night-coverage at [email or web address], password [facility] 04/20/2012, 8:44 AM  LOS: 1 day

## 2012-04-20 NOTE — Consult Note (Signed)
WOC consult Note Reason for Consult: Consult requested for chronic abd full thickness wound and colostomy.  Pt states her wound is followed by the outpatient wound care center and she has received some special dressing which is "made from sheep stomach" and should not be disturbed.  This sounds like a skin substitute to assist with wound closure, and it is not due to be changed until Monday by the outpatient wound center.  This dressing should remain undisturbed at this time, and if patient's length of stay is prolonged next week, then wound care issue can be addressed. WOC ostomy consult  Stoma type/location: colostomy to left lower quad, pt has had since 6/13 and states her husband is familiar with pouching routines and ordering supplies.  She is well-informed on emptying and changing routines. Stomal assessment/size: 11/4 inch, slightly above skin level, stoma red and viable. Peristomal assessment: Intact skin surrounding, slight peristomal hernia noted to area. Output  50cc semi formed brown stool. Ostomy pouching: 2 piece Education provided: Applied new 2 piece pouch and supplies ordered to bedside for staff use.  Pt denies further questions at this time regarding ostomy. Will not plan to follow further unless re-consulted.  [address], RN, MSN, [employer]  [phone]

## 2012-04-20 NOTE — Clinical Social Work Psychosocial (Signed)
     Clinical Social Work Department BRIEF PSYCHOSOCIAL ASSESSMENT 04/20/2012  Patient:  [name],[name]     Account Number:  [id number]     Admit date:  04/19/2012  Clinical Social Worker:  Tiburcio Pea  Date/Time:  04/20/2012 06:02 PM  Referred by:  Physician  Date Referred:  04/20/2012 Referred for  SNF Placement   Other Referral:   Return to [facility]-- ?Increased level of care   Interview type:  Patient Other interview type:    PSYCHOSOCIAL DATA Living Status:  FACILITY Admitted from facility:  [facility] Level of care:  Independent Living Primary support name:  Peyton Najjar Hip Primary support relationship to patient:  SPOUSE Degree of support available:   Good support    CURRENT CONCERNS Current Concerns  Post-Acute Placement   Other Concerns:    SOCIAL WORK ASSESSMENT / PLAN Patient resides at [facility] where she lives with her husband Peyton Najjar.  Patient may require short term rehab in either the SNF or the Assisted Living. CSW- Unk Lightning, LCSW spoke with staff at [facility]- they will accept patient back when medically stable.  Fl2 placed on chart for MD's signature. Will need to determine best possible level of care.   Assessment/plan status:  Psychosocial Support/Ongoing Assessment of Needs Other assessment/ plan:   Information/referral to community resources:   None at this time    PATIENTS/FAMILYS RESPONSE TO PLAN OF CARE: Patient is alert and oriented, very pleasant lady. She wants to return to [facility] and is aware of above information regarding possible need for short term SNF. There is a bed available at [facility] when stable.  CSW will monitor and assist. ? DC back on Monday.

## 2012-04-20 NOTE — Evaluation (Signed)
Occupational Therapy Evaluation Patient Details Name: [name] MRN: [id number] DOB: 10-11-1933 Today's Date: 04/20/2012 Time: 4098-1191 OT Time Calculation (min): 28 min  OT Assessment / Plan / Recommendation Clinical Impression    Pt admitted s/p fall with bilateral pubic ramii fxs. No WB orders although according to Dr. Diamantina Providence note pt is WBAT on LLE and TDWB on RLE, clarification needed prior to weightbearing.  Pt will need SNF before eventual discharge home.     OT Assessment  Patient needs continued OT Services    Follow Up Recommendations  Skilled nursing facility    Barriers to Discharge Decreased caregiver support husband cannot physically assist  Equipment Recommendations  None recommended by OT    Recommendations for Other Services    Frequency  Min 2X/week    Precautions / Restrictions Precautions Precautions: None Restrictions Weight Bearing Restrictions: Yes RLE Weight Bearing: Touchdown weight bearing LLE Weight Bearing: Weight bearing as tolerated   Pertinent Vitals/Pain See vitals    ADL  Eating/Feeding: Performed;Independent Where Assessed - Eating/Feeding: Bed level Grooming: Performed;Wash/dry face;Set up Where Assessed - Grooming: Supine, head of bed up Equipment Used:  (none) Transfers/Ambulation Related to ADLs: Pt limited by pain. ADL Comments: Pt required significant amount of encouragement to participate in bed moblity during eval session and increased time to perform bed mobility tasks. Pt refusing to sit EOB for further ADL assessment due to pain.  RN reporting that pt was not due for any more pain meds.    OT Diagnosis: Generalized weakness;Acute pain  OT Problem List: Decreased strength;Decreased activity tolerance;Decreased knowledge of use of DME or AE;Decreased knowledge of precautions;Pain;Impaired balance (sitting and/or standing) OT Treatment Interventions: Self-care/ADL training;DME and/or AE instruction;Therapeutic  activities;Patient/family education;Balance training   OT Goals Acute Rehab OT Goals OT Goal Formulation: With patient Time For Goal Achievement: 05/04/12 Potential to Achieve Goals: Good ADL Goals Pt Will Perform Upper Body Bathing: with set-up;Sitting, chair;Sitting, edge of bed ADL Goal: Upper Body Bathing - Progress: Goal set today Pt Will Perform Upper Body Dressing: with set-up;Sitting, chair;Sitting, bed ADL Goal: Upper Body Dressing - Progress: Goal set today Pt Will Transfer to Toilet: with min assist;Stand pivot transfer;3-in-1;with DME;Maintaining weight bearing status ADL Goal: Toilet Transfer - Progress: Goal set today Miscellaneous OT Goals Miscellaneous OT Goal #1: Pt will perform bed mobility with supervision in prep for EOB ADLs. OT Goal: Miscellaneous Goal #1 - Progress: Goal set today Miscellaneous OT Goal #2: Pt will tolerate dynamic sitting balance tasks EOB with supervision >5 min. OT Goal: Miscellaneous Goal #2 - Progress: Goal set today  Visit Information  Last OT Received On: 04/20/12 Assistance Needed: +2 PT/OT Co-Evaluation/Treatment: Yes    Subjective Data      Prior Functioning     Home Living Lives With: Spouse Available Help at Discharge: Family;Available 24 hours/day (husband recently had LVAD surgery) Type of Home: House Home Access: Level entry Home Layout: One level Bathroom Shower/Tub: Walk-in shower;Door [employer] Toilet: Handicapped height Bathroom Accessibility: Yes How Accessible: Accessible via walker Home Adaptive Equipment: Shower chair with back;Bedside commode/3-in-1;Grab bars around toilet;Grab bars in shower;Walker - rolling;Straight cane Prior Function Level of Independence: Independent with assistive device(s) Able to Take Stairs?: No Driving: Yes Comments: uses RW at all times. Had ostomy in April 2013 and has been sponge bathing since then.  Husband unable to physically assist as he had LVAD surgery in  April. Communication Communication: No difficulties Dominant Hand: Right         Vision/Perception  Cognition  Overall Cognitive Status: Appears within functional limits for tasks assessed/performed Arousal/Alertness: Awake/alert Orientation Level: Appears intact for tasks assessed Behavior During Session: [facility] for tasks performed    Extremity/Trunk Assessment Right Upper Extremity Assessment RUE ROM/Strength/Tone: [facility] for tasks assessed Left Upper Extremity Assessment LUE ROM/Strength/Tone: [facility] for tasks assessed Right Lower Extremity Assessment RLE ROM/Strength/Tone: Deficits;Unable to fully assess;Due to pain RLE ROM/Strength/Tone Deficits: Knees and Ankle WFL. Unable to assess hip secondary to pain Left Lower Extremity Assessment LLE ROM/Strength/Tone: Unable to fully assess;Due to pain;Deficits LLE ROM/Strength/Tone Deficits: Knees and Ankle WFL. Unable to assess hip secondary to pain     Mobility Bed Mobility Bed Mobility: Rolling Left;Scooting to [facility] Rolling Left: 5: Supervision Scooting to [facility]: 7: Independent Details for Bed Mobility Assistance: Pt resistant to movements secondary to pain. Able to roll majority of the way left holding rail. Pt able to go from supine to long sitting independently Transfers Transfers: Not assessed     Shoulder Instructions     Exercise General Exercises - Lower Extremity Heel Slides: AROM;Strengthening;Both;10 reps;Seated   Balance     End of Session OT - End of Session Equipment Utilized During Treatment:  (none) Activity Tolerance: Patient limited by pain Patient left: in bed;with call bell/phone within reach Nurse Communication: Patient requests pain meds;Other (comment) (bed mobility)  GO    04/20/2012 Cipriano Mile OTR/L Pager [phone] Office [phone]  Cipriano Mile 04/20/2012, 6:26 PM

## 2012-04-20 NOTE — Progress Notes (Signed)
Utilization review completed. Charlis Harner, RN, BSN. 

## 2012-04-21 LAB — CBC
HCT: 33 % — ABNORMAL LOW (ref 36.0–46.0)
MCHC: 32.1 g/dL (ref 30.0–36.0)
Platelets: 146 10*3/uL — ABNORMAL LOW (ref 150–400)
RDW: 13.8 % (ref 11.5–15.5)
WBC: 7.5 10*3/uL (ref 4.0–10.5)

## 2012-04-21 MED ORDER — BISACODYL 5 MG PO TBEC
10.0000 mg | DELAYED_RELEASE_TABLET | Freq: Once | ORAL | Status: AC
  Administered 2012-04-21: 10 mg via ORAL
  Filled 2012-04-21: qty 2

## 2012-04-21 MED ORDER — LACTULOSE 10 GM/15ML PO SOLN
10.0000 g | Freq: Two times a day (BID) | ORAL | Status: DC
  Administered 2012-04-21 – 2012-04-23 (×3): 10 g via ORAL
  Filled 2012-04-21 (×7): qty 15

## 2012-04-21 NOTE — Progress Notes (Signed)
Physical Therapy Treatment Patient Details Name: [name] MRN: [id number] DOB: 1934/03/22 Today's Date: 04/21/2012 Time: 4098-1191 PT Time Calculation (min): 30 min  PT Assessment / Plan / Recommendation Comments on Treatment Session  good progress today with increased activity tolerance.    Follow Up Recommendations  Post acute inpatient     Does the patient have the potential to tolerate intense rehabilitation  No, Recommend SNF           Frequency Min 4X/week   Plan Discharge plan remains appropriate;Frequency remains appropriate    Precautions / Restrictions Precautions Precautions: Fall Restrictions RLE Weight Bearing: Weight bearing as tolerated LLE Weight Bearing: Weight bearing as tolerated Other Position/Activity Restrictions: Recieved clarification of pt's wt bearing status from Ortho MD prior to session: Rt WBAT, Lt FWB       Mobility  Bed Mobility Bed Mobility: Supine to Sit;Sitting - Scoot to Edge of Bed Supine to Sit: 4: Min assist;HOB flat;With rails;HOB elevated (bed elevated intially to get edge of bed, lowered for scoot ) Sitting - Scoot to Edge of Bed: 5: Supervision Details for Bed Mobility Assistance: cues for technique and hand postion to assist with mobility to get to edge of bed. Transfers Transfers: Sit to Stand;Stand to [employer] Transfers Sit to Stand: 4: Min assist;3: Mod assist;From elevated surface;From chair/3-in-1;With upper extremity assist;With armrests;From bed Stand to Sit: 4: Min assist;To chair/3-in-1;With upper extremity assist Stand Pivot Transfers: 3: Mod assist Details for Transfer Assistance: cues for hand placement with transfers sit to/from standing. mod assit for balance with stand pivot transfer with cues for posture, use of arms to off load legs due to pain and to adavance left leg with pivot/shuffled hop steps. assist needed to advance right leg. pt able to move left leg without assistance. performed bed to 3n1 toward  right and 3n1 to chair toward left. does better with transfer to left.                PT Goals Acute Rehab PT Goals PT Goal: Supine/Side to Sit - Progress: Progressing toward goal PT Goal: Sit to Stand - Progress: Progressing toward goal PT Goal: Stand to Sit - Progress: Progressing toward goal PT Transfer Goal: Bed to Chair/Chair to Bed - Progress: Progressing toward goal  Visit Information  Last PT Received On: 04/21/12 Assistance Needed: +2    Subjective Data  Subjective: Reports pelvic pain on the right when she moves, 4-5/10. RN aware and pt premedicated.   Cognition  Overall Cognitive Status: Appears within functional limits for tasks assessed/performed Arousal/Alertness: Awake/alert Orientation Level: Appears intact for tasks assessed Behavior During Session: Anxious       End of Session PT - End of Session Equipment Utilized During Treatment: Gait belt Activity Tolerance: Patient tolerated treatment well;Patient limited by pain Patient left: in chair;with call bell/phone within reach;with nursing in room Nurse Communication: Mobility status;Patient requests pain meds;Weight bearing status   GP     Sallyanne Kuster 04/21/2012, 1:22 PM  Sallyanne Kuster, PTA Office- [phone]

## 2012-04-21 NOTE — Progress Notes (Signed)
[facility] PROGRESS NOTE  Jenah Noorani VHQ:[id number] DOB: 1934-04-30 DOA: 04/19/2012 PCP: [name], MD  Assessment/Plan: Principal Problem:  *Fracture of multiple pubic rami Active Problems:  HYPOTHYROIDISM  ANEMIA, SECONDARY TO CHRONIC BLOOD LOSS  Atrial fibrillation  HEART FAILURE  GERD  Chronic obstructive asthma    Fracture multiple pubic Rami, orthopedics recommends pain control all surgery at this point, orthopedics recommends SNF .Pt is WBAT on left side and PWB on right side per MD.   HYPOTHYROIDISM: TSH 8.8 in 6/13. Will check TSH and free T4. Of note in June of this year pt hospitalized. Will continue home meds  ANEMIA, likely due chronic disease. Hg 10.6. Chart review indicates baseline 9-10. No s/sx bleeding. Will monitor.  Atrial fibrillation: rate controlled. Not on coumadin. Will continue home meds  Chronic systolic HEART FAILURE: appears compensated. Will monitor intake and output. Will get daily wts. Continue home meds. Echo in 2011 yields EF 30-35% with grade 1 diastolic dysfunction  GERD: at baseline. Continue PPI  Chronic obstructive asthma: at baseline. On home 02. Monitor sats. Continue home meds  Constipation started on MiraLAX and aggressive constipation regimen     Code Status: full  Family Communication: family updated about patient's clinical progress  Disposition Plan: SNF on Monday   Brief narrative:  [name] is a 78 y.o. female With past medical hx including but not limited to COPD, afib nit on coumadin, HTN, anemia, diverticulitis with abscess/contained perforation, breast cancer presents to ED cc fall. Information obtained from patient. Reports that she was "racing" to answer the door and her foot caught on her 02 tubing and she fell straight down. She did not lose consciousness, did not hit head. Reports feeling pain in right inguinal area. Describes pain as constant worse with moving and better if lying still. Pain medicine also  helps pain. Characterizes pain as moderate to severe. She states she has been in her usual state of health. No recent illnesses, no chest pain, no fever, chills. Work up in the ED yields fx pubic rami. We are asked to admit for further eval and treatment.   Consultants:  Orthopedics Procedures:  None Antibiotics:  None HPI/Subjective:  Stable overnight  Objective: Filed Vitals:   04/20/12 1300 04/20/12 2224 04/20/12 2334 04/21/12 0552  BP: 150/72  149/62 133/59  Pulse: 75  83 75  Temp: 99.4 F (37.4 C)  101.3 F (38.5 C) 98.5 F (36.9 C)  TempSrc:      Resp: 18  16 16   SpO2: 100% 98% 98% 98%   No intake or output data in the 24 hours ending 04/21/12 0827  Exam:  HENT:  Head: Atraumatic.  Nose: Nose normal.  Mouth/Throat: Oropharynx is clear and moist.  Eyes: Conjunctivae are normal. Pupils are equal, round, and reactive to light. No scleral icterus.  Neck: Neck supple. No tracheal deviation present.  Cardiovascular: Normal rate, regular rhythm, normal heart sounds and intact distal pulses.  Pulmonary/Chest: Effort normal and breath sounds normal. No respiratory distress.  Abdominal: Soft. Normal appearance and bowel sounds are normal. She exhibits no distension. There is no tenderness.  Musculoskeletal: She exhibits no edema and no tenderness.  Neurological: She is alert. No cranial nerve deficit.    Data Reviewed: Basic Metabolic Panel:  Lab 04/20/12 4132 04/19/12 1551  NA 135 138  K 4.0 4.0  CL 101 102  CO2 26 28  GLUCOSE 101* 92  BUN 13 16  CREATININE 0.92 0.90  CALCIUM 9.3 9.5  MG -- --  PHOS -- --    Liver Function Tests:  Lab 04/19/12 1551  AST 32  ALT 22  ALKPHOS 60  BILITOT 0.3  PROT 6.0  ALBUMIN 3.3*   No results found for this basename: LIPASE:5,AMYLASE:5 in the last 168 hours No results found for this basename: AMMONIA:5 in the last 168 hours  CBC:  Lab 04/21/12 0450 04/20/12 0655 04/19/12 1551  WBC 7.5 8.3 11.8*  NEUTROABS -- -- --   HGB 10.6* 10.9* 12.4  HCT 33.0* 34.1* 38.0  MCV 91.2 91.4 91.3  PLT 146* 144* 157    Cardiac Enzymes:  Lab 04/20/12 2010 04/20/12 1448 04/20/12 0918  CKTOTAL -- -- --  CKMB -- -- --  CKMBINDEX -- -- --  TROPONINI <0.30 <0.30 <0.30   BNP (last 3 results)  Basename 12/03/11 0435 11/16/11 0605 11/14/11 0500  PROBNP 2410.0* >70000.0* 11055.0*     CBG: No results found for this basename: GLUCAP:5 in the last 168 hours  Recent Results (from the past 240 hour(s))  MRSA PCR SCREENING     Status: Normal   Collection Time   04/20/12 10:00 AM      Component Value Range Status Comment   MRSA by PCR NEGATIVE  NEGATIVE Final      Studies: Dg Chest 2 View  04/19/2012  *RADIOLOGY REPORT*  Clinical Data: Fall, shortness of breath  CHEST - 2 VIEW  Comparison: 09/03/2011  Findings: Left upper lobe calcified granuloma.  Chronic interstitial markings.  No pleural effusion or pneumothorax.  Cardiomediastinal silhouette is within normal limits.  Left subclavian pacemaker.  Surgical clips overlying the left chest wall.  Right lateral rib fracture deformities.  IMPRESSION: No evidence of acute cardiopulmonary disease.   Original Report Authenticated By: Charline Bills, M.D.    Dg Elbow Complete Right  04/19/2012  *RADIOLOGY REPORT*  Clinical Data: History of injury from fall with posterior elbow the soft tissue laceration.  RIGHT ELBOW - COMPLETE 3+ VIEW  Comparison: None.  Findings: There is no positive fat pad sign to suggest joint effusion.  Alignment is normal.  No fracture or bony destruction is evident.  There is minimal spurring of the coronoid process of the proximal ulna.  There is slight degenerative roughening of the lateral epicondyle of the distal humerus.  A tiny calcific density is seen adjacent to the radial head.  This may reflect previous trauma.  A tiny loose body or previous chip fracture cannot be excluded.  IMPRESSION: History given of injury of the posterior aspect of the  elbow with soft tissue laceration.  No fracture of the distal humerus or olecranon is seen.  No dislocation is evident.  There is no evidence of joint effusion.  There is slight degenerative roughening of the lateral epicondyle of the distal humerus. There is minimal beginning spurring of the coronoid process of the proximal ulna.  A tiny calcific density is seen adjacent to the radial head.  This may reflect previous trauma.  A tiny loose body or previous chip fracture cannot be excluded.   Original Report Authenticated By: Onalee Hua Call    Dg Hip Complete Right  04/19/2012  *RADIOLOGY REPORT*  Clinical Data: History of injury from fall with pain in the right inguinal region.  RIGHT HIP - COMPLETE 2+ VIEW  Comparison: CT 12/03/2010.  Findings: There is acute fracture of the medial aspect of the right pubic bone with inferior displacement of the medial distal fracture fragment.  There is a hairline fracture through the inferior aspect  of the right ischial ramus.  No dislocation is evident. No hip fracture or dislocation is evident.  There is chronic enthesophyte spurring of the greater trochanter area bilaterally which is chronic.  There is scoliosis.  There is osteopenic appearance of bones.  There is degenerative spondylosis.  Pelvic phleboliths are seen.  Surgical staple projects inferior to the right SI joint.  IMPRESSION: Acute fractures of the right pubic ramus and right ischial ramus.  No dislocation.  No hip fracture or dislocation is evident. Osteopenic appearance of bones.  Other stable chronic findings are detailed above.   Original Report Authenticated By: Onalee Hua Call    Dg Abd Portable 1v  04/20/2012  *RADIOLOGY REPORT*  Clinical Data: Evaluate for ileus.  Pelvic fractures.  PORTABLE ABDOMEN - 1 VIEW  Comparison: 04/19/2012 and 11/11/2011 and abdominal CT 12/04/2011  Findings: There is rightward scoliosis of the lumbar spine.  Marked disc space disease at L1-L2.  There is calcification in the right  upper abdomen probably associated with the anterior ribs.  There is a gas filled dilated loop of bowel in the lower mid abdomen which may represent colon.  Small amount of bowel gas throughout the pelvic region.  IMPRESSION: Nonspecific bowel gas pattern.  Scoliosis.   Original Report Authenticated By: Richarda Overlie, M.D.     Scheduled Meds:   . sodium chloride   Intravenous STAT  . albuterol  2 puff Inhalation BID  . DULoxetine  30 mg Oral Daily  . enoxaparin (LOVENOX) injection  40 mg Subcutaneous Q24H  . fluticasone  1 spray Each Nare Daily  . fluticasone  2 puff Inhalation BID  . levothyroxine  50 mcg Oral QAC breakfast  . losartan  50 mg Oral Daily  . magnesium oxide  400 mg Oral Daily  . metoprolol succinate  25 mg Oral Daily  . montelukast  10 mg Oral QHS  . multivitamin with minerals  1 tablet Oral Daily  . pantoprazole  40 mg Oral Daily  . polyethylene glycol  17 g Oral Daily  . polyvinyl alcohol  2 drop Both Eyes BID  . predniSONE  7.5 mg Oral Q breakfast  . senna-docusate  1 tablet Oral BID  . simvastatin  20 mg Oral q1800   Continuous Infusions:   Principal Problem:  *Fracture of multiple pubic rami Active Problems:  HYPOTHYROIDISM  ANEMIA, SECONDARY TO CHRONIC BLOOD LOSS  Atrial fibrillation  HEART FAILURE  GERD  Chronic obstructive asthma    Time spent: 40 minutes   [facility]  [facility] Pager [phone]. If 8PM-8AM, please contact night-coverage at [email or web address], password [facility] 04/21/2012, 8:27 AM  LOS: 2 days

## 2012-04-22 MED ORDER — HYDROCODONE-ACETAMINOPHEN 10-325 MG PO TABS
1.0000 | ORAL_TABLET | Freq: Four times a day (QID) | ORAL | Status: DC | PRN
  Administered 2012-04-22 – 2012-04-23 (×4): 1 via ORAL
  Filled 2012-04-22 (×4): qty 1

## 2012-04-22 NOTE — Progress Notes (Signed)
Subjective:     Patient reports pain as moderate.  pain worse when ambulation and when she gets up  Objective: Vital signs in last 24 hours: Temp:  [98.1 F (36.7 C)-99.4 F (37.4 C)] 98.6 F (37 C) (11/03 1556) Pulse Rate:  [78-96] 96  (11/03 1556) Resp:  [18] 18  (11/03 1556) BP: (128-141)/(62-70) 140/62 mmHg (11/03 1556) SpO2:  [98 %-100 %] 100 % (11/03 1556)  Intake/Output from previous day: 11/02 0701 - 11/03 0700 In: 480 [P.O.:480] Out: 700 [Stool:700] Intake/Output this shift:     Basename 04/21/12 0450 04/20/12 0655  HGB 10.6* 10.9*    Basename 04/21/12 0450 04/20/12 0655  WBC 7.5 8.3  RBC 3.62* 3.73*  HCT 33.0* 34.1*  PLT 146* 144*    Basename 04/20/12 0655  NA 135  K 4.0  CL 101  CO2 26  BUN 13  CREATININE 0.92  GLUCOSE 101*  CALCIUM 9.3   No results found for this basename: LABPT:2,INR:2 in the last 72 hours  Neurologically intact  Assessment/Plan:     Up with therapy ,  Pain from Fx will get a lot better in a couple weeks.   [name] C 04/22/2012, 4:52 PM

## 2012-04-22 NOTE — Progress Notes (Addendum)
[facility] PROGRESS NOTE  [name] ZOX:[id number] DOB: [birth year] DOA: 04/19/2012 PCP: [name], MD  Assessment/Plan: Principal Problem:  *Fracture of multiple pubic rami Active Problems:  HYPOTHYROIDISM  ANEMIA, SECONDARY TO CHRONIC BLOOD LOSS  Atrial fibrillation  HEART FAILURE  GERD  Chronic obstructive asthma    *Fracture multiple pubic Rami, orthopedics recommends pain control all surgery at this point, orthopedics recommends SNF .Pt is WBAT on left side and PWB on right side per MD. very hesitant to start physical therapy. Requires a lot of reassurance.  HYPOTHYROIDISM: TSH 8.8 in 6/13. Will check TSH and free T4. Of note in June of this year pt hospitalized. Will continue home meds  ANEMIA, likely due chronic disease. Hg 10.6. Chart review indicates baseline 9-10. No s/sx bleeding. Will monitor.   Atrial fibrillation: rate controlled. Not on coumadin. Will continue home meds   Chronic systolic HEART FAILURE: appears compensated. Will monitor intake and output. Will get daily wts. Continue home meds. Echo in 2011 yields EF 30-35% with grade 1 diastolic dysfunction   GERD: at baseline. Continue PPI   Chronic obstructive asthma: at baseline. On home 02. Monitor sats. Continue home meds   Constipation , patient has a colostomy bag, concerned about low colostomy output, abdominal KUB negative, started on MiraLAX and aggressive constipation regimen   Abdominal wound dehiscence Wound care consult     Code Status: full  Family Communication: family updated about patient's clinical progress  Disposition Plan: SNF on Monday    Brief narrative:  [name] is a 78 y.o. female With past medical hx including but not limited to COPD, afib nit on coumadin, HTN, anemia, diverticulitis with abscess/contained perforation, breast cancer presents to ED cc fall. Information obtained from patient. Reports that she was "racing" to answer the door and her foot caught on  her 02 tubing and she fell straight down. She did not lose consciousness, did not hit head. Reports feeling pain in right inguinal area. Describes pain as constant worse with moving and better if lying still. Pain medicine also helps pain. Characterizes pain as moderate to severe. She states she has been in her usual state of health. No recent illnesses, no chest pain, no fever, chills. Work up in the ED yields fx pubic rami. We are asked to admit for further eval and treatment.  Consultants:  Orthopedics Procedures:  None Antibiotics:  None HPI/Subjective:  Stable overnight  Objective: Filed Vitals:   04/21/12 0552 04/21/12 0907 04/21/12 2228 04/22/12 0604  BP: 133/59  141/70 128/63  Pulse: 75  78 87  Temp: 98.5 F (36.9 C)  99.4 F (37.4 C) 98.1 F (36.7 C)  TempSrc:      Resp: 16  18 18   SpO2: 98% 99% 98% 99%    Intake/Output Summary (Last 24 hours) at 04/22/12 0858 Last data filed at 04/22/12 0700  Gross per 24 hour  Intake    480 ml  Output    700 ml  Net   -220 ml    Exam: General: Awake alert oriented x3  Eyes: glasses, PERRL EOMI  ENT: mucus membranes of nose/mouth pink/moist  Neck: supple No JVD No lymphadenopathy  Cardiovascular: RRR No MGR, No LEE PPP  Respiratory: normal effort. BSCTAB No wheeze/rhonchi  Abdomen: obese, soft, left lower quadrant colostomy with small amount light brown soft stool. Midline abdominal dressing dry and intact.  Skin: warm and dry. Dressing to R elbow dry and intact covering abrasion  Musculoskeletal: MOE albeit slowly with LE  due to pain. No joint swelling/erythema  Psychiatric: appropriate cooperative  Neurologic: cranial nerve II-XII intact speech clear facial symmetry     Data Reviewed: Basic Metabolic Panel:  Lab 04/20/12 9147 04/19/12 1551  NA 135 138  K 4.0 4.0  CL 101 102  CO2 26 28  GLUCOSE 101* 92  BUN 13 16  CREATININE 0.92 0.90  CALCIUM 9.3 9.5  MG -- --  PHOS -- --    Liver Function Tests:  Lab  04/19/12 1551  AST 32  ALT 22  ALKPHOS 60  BILITOT 0.3  PROT 6.0  ALBUMIN 3.3*   No results found for this basename: LIPASE:5,AMYLASE:5 in the last 168 hours No results found for this basename: AMMONIA:5 in the last 168 hours  CBC:  Lab 04/21/12 0450 04/20/12 0655 04/19/12 1551  WBC 7.5 8.3 11.8*  NEUTROABS -- -- --  HGB 10.6* 10.9* 12.4  HCT 33.0* 34.1* 38.0  MCV 91.2 91.4 91.3  PLT 146* 144* 157    Cardiac Enzymes:  Lab 04/20/12 2010 04/20/12 1448 04/20/12 0918  CKTOTAL -- -- --  CKMB -- -- --  CKMBINDEX -- -- --  TROPONINI <0.30 <0.30 <0.30   BNP (last 3 results)  Basename 12/03/11 0435 11/16/11 0605 11/14/11 0500  PROBNP 2410.0* >70000.0* 11055.0*     CBG: No results found for this basename: GLUCAP:5 in the last 168 hours  Recent Results (from the past 240 hour(s))  MRSA PCR SCREENING     Status: Normal   Collection Time   04/20/12 10:00 AM      Component Value Range Status Comment   MRSA by PCR NEGATIVE  NEGATIVE Final      Studies: Dg Chest 2 View  04/19/2012  *RADIOLOGY REPORT*  Clinical Data: Fall, shortness of breath  CHEST - 2 VIEW  Comparison: 09/03/2011  Findings: Left upper lobe calcified granuloma.  Chronic interstitial markings.  No pleural effusion or pneumothorax.  Cardiomediastinal silhouette is within normal limits.  Left subclavian pacemaker.  Surgical clips overlying the left chest wall.  Right lateral rib fracture deformities.  IMPRESSION: No evidence of acute cardiopulmonary disease.   Original Report Authenticated By: Charline Bills, M.D.    Dg Elbow Complete Right  04/19/2012  *RADIOLOGY REPORT*  Clinical Data: History of injury from fall with posterior elbow the soft tissue laceration.  RIGHT ELBOW - COMPLETE 3+ VIEW  Comparison: None.  Findings: There is no positive fat pad sign to suggest joint effusion.  Alignment is normal.  No fracture or bony destruction is evident.  There is minimal spurring of the coronoid process of the  proximal ulna.  There is slight degenerative roughening of the lateral epicondyle of the distal humerus.  A tiny calcific density is seen adjacent to the radial head.  This may reflect previous trauma.  A tiny loose body or previous chip fracture cannot be excluded.  IMPRESSION: History given of injury of the posterior aspect of the elbow with soft tissue laceration.  No fracture of the distal humerus or olecranon is seen.  No dislocation is evident.  There is no evidence of joint effusion.  There is slight degenerative roughening of the lateral epicondyle of the distal humerus. There is minimal beginning spurring of the coronoid process of the proximal ulna.  A tiny calcific density is seen adjacent to the radial head.  This may reflect previous trauma.  A tiny loose body or previous chip fracture cannot be excluded.   Original Report Authenticated By: Onalee Hua Call  Dg Hip Complete Right  04/19/2012  *RADIOLOGY REPORT*  Clinical Data: History of injury from fall with pain in the right inguinal region.  RIGHT HIP - COMPLETE 2+ VIEW  Comparison: CT 12/03/2010.  Findings: There is acute fracture of the medial aspect of the right pubic bone with inferior displacement of the medial distal fracture fragment.  There is a hairline fracture through the inferior aspect of the right ischial ramus.  No dislocation is evident. No hip fracture or dislocation is evident.  There is chronic enthesophyte spurring of the greater trochanter area bilaterally which is chronic.  There is scoliosis.  There is osteopenic appearance of bones.  There is degenerative spondylosis.  Pelvic phleboliths are seen.  Surgical staple projects inferior to the right SI joint.  IMPRESSION: Acute fractures of the right pubic ramus and right ischial ramus.  No dislocation.  No hip fracture or dislocation is evident. Osteopenic appearance of bones.  Other stable chronic findings are detailed above.   Original Report Authenticated By: Onalee Hua Call    Dg  Abd Portable 1v  04/20/2012  *RADIOLOGY REPORT*  Clinical Data: Evaluate for ileus.  Pelvic fractures.  PORTABLE ABDOMEN - 1 VIEW  Comparison: 04/19/2012 and 11/11/2011 and abdominal CT 12/04/2011  Findings: There is rightward scoliosis of the lumbar spine.  Marked disc space disease at L1-L2.  There is calcification in the right upper abdomen probably associated with the anterior ribs.  There is a gas filled dilated loop of bowel in the lower mid abdomen which may represent colon.  Small amount of bowel gas throughout the pelvic region.  IMPRESSION: Nonspecific bowel gas pattern.  Scoliosis.   Original Report Authenticated By: Richarda Overlie, M.D.     Scheduled Meds:   . albuterol  2 puff Inhalation BID  . [COMPLETED] bisacodyl  10 mg Oral Once  . DULoxetine  30 mg Oral Daily  . enoxaparin (LOVENOX) injection  40 mg Subcutaneous Q24H  . fluticasone  1 spray Each Nare Daily  . fluticasone  2 puff Inhalation BID  . lactulose  10 g Oral BID  . levothyroxine  50 mcg Oral QAC breakfast  . losartan  50 mg Oral Daily  . magnesium oxide  400 mg Oral Daily  . metoprolol succinate  25 mg Oral Daily  . montelukast  10 mg Oral QHS  . multivitamin with minerals  1 tablet Oral Daily  . pantoprazole  40 mg Oral Daily  . polyethylene glycol  17 g Oral Daily  . polyvinyl alcohol  2 drop Both Eyes BID  . predniSONE  7.5 mg Oral Q breakfast  . senna-docusate  1 tablet Oral BID  . simvastatin  20 mg Oral q1800   Continuous Infusions:   Principal Problem:  *Fracture of multiple pubic rami Active Problems:  HYPOTHYROIDISM  ANEMIA, SECONDARY TO CHRONIC BLOOD LOSS  Atrial fibrillation  HEART FAILURE  GERD  Chronic obstructive asthma    Time spent: 40 minutes   [facility]  [facility] Pager [phone]. If 8PM-8AM, please contact night-coverage at [email or web address], password [facility] 04/22/2012, 8:58 AM  LOS: 3 days

## 2012-04-23 ENCOUNTER — Encounter (HOSPITAL_BASED_OUTPATIENT_CLINIC_OR_DEPARTMENT_OTHER): Payer: Medicare Other | Attending: General Surgery

## 2012-04-23 DIAGNOSIS — J9600 Acute respiratory failure, unspecified whether with hypoxia or hypercapnia: Secondary | ICD-10-CM

## 2012-04-23 DIAGNOSIS — J309 Allergic rhinitis, unspecified: Secondary | ICD-10-CM

## 2012-04-23 DIAGNOSIS — I4891 Unspecified atrial fibrillation: Secondary | ICD-10-CM

## 2012-04-23 DIAGNOSIS — Y838 Other surgical procedures as the cause of abnormal reaction of the patient, or of later complication, without mention of misadventure at the time of the procedure: Secondary | ICD-10-CM | POA: Insufficient documentation

## 2012-04-23 DIAGNOSIS — L98499 Non-pressure chronic ulcer of skin of other sites with unspecified severity: Secondary | ICD-10-CM | POA: Insufficient documentation

## 2012-04-23 DIAGNOSIS — T8189XA Other complications of procedures, not elsewhere classified, initial encounter: Secondary | ICD-10-CM | POA: Insufficient documentation

## 2012-04-23 MED ORDER — HYDROCODONE-ACETAMINOPHEN 5-500 MG PO TABS: 1.0000 | ORAL_TABLET | ORAL | Status: DC | PRN

## 2012-04-23 MED ORDER — OXYCODONE HCL 5 MG PO TABS: 5.0000 mg | ORAL_TABLET | Freq: Three times a day (TID) | ORAL | Status: DC | PRN

## 2012-04-23 NOTE — Progress Notes (Signed)
OK per MD for d/c of patient today back to [facility]- at SNF level of care for short term stay. She will then return to independent living with her husband. PASARR in place. EMS transported. Patient and husband were pleased with d/c plan. Notified SNF and pt's nurse Meriam Sprague of d/c plan. No further CSW needs identifed.  Lorri Frederick. [place]  [phone]

## 2012-04-23 NOTE — Consult Note (Signed)
Wound care follow-up:  Pt has chronic full thickness abd wound which is followed by outpatient wound care center.  She has had some type of skin substitute applied to promote healing and it should be changed once a week.  We do not have this product in our [facility] formulary.  Pt will need to follow-up with outpatient wound center after discharge for further plan of care.  Wound 4X2X.1cm, 100% red, yellow dry wound edges, no odor, mod tan drainage.  Pt plans to D/C to SNF today. Applied Mepitel silicone non-adherent contact layer and hydrogel to protect site with abd pad.  This should be changed once a week on Monday. Ostomy pouch changed.  Stoma red and viable, slightly above skin level, 11/2 inches, peristomal hernia surrounding. Applied 2 piece pouching system and ordered supplies for transfer to SNF.  Pt states her husband is familiar with pouching routines and ordering supplies.  Provided information regarding peri-stomal hernias.   Will not plan to follow further unless re-consulted.  [address] RN, MSN, [employer]  [phone]

## 2012-04-23 NOTE — Discharge Summary (Signed)
[facility]                                                                                   [name], is a 78 y.o. female  DOB Apr 02, 1934  MRN [id number].  Admission date:  04/19/2012  Discharge Date:  04/23/2012  Primary MD  [name], MD  Admitting Physician  Joseph Art, DO  Admission Diagnosis  Unspecified hypothyroidism [244.9] History of hypertension [V12.59] Multiple pelvic fractures [808.43] Chronic obstructive asthma [493.20] FALL  Discharge Diagnosis     Principal Problem:  *Fracture of multiple pubic rami Active Problems:  HYPOTHYROIDISM  ANEMIA, SECONDARY TO CHRONIC BLOOD LOSS  Atrial fibrillation  HEART FAILURE  GERD  Chronic obstructive asthma     Past Medical History  Diagnosis Date  . Insomnia, unspecified   . Osteoarthrosis, unspecified whether generalized or localized, unspecified site   . Unspecified hypothyroidism   . Unspecified essential hypertension   . Atrial fibrillation   . Sciatica   . Anemia   . COPD (chronic obstructive pulmonary disease)   . UTI (urinary tract infection)   . GERD (gastroesophageal reflux disease)   . Pneumonia   . Asthma   . Clostridium difficile colitis   . Diverticulitis of sigmoid colon 11/2010    with abscess/contained perforation  . Dementia     mild  . Dehydration with hyponatremia 12/2010    hospitalized  . Angiodysplasia of stomach     ablated 2010  . Angiodysplasia of colon     ablated 2010  . Vitamin B12 deficiency (dietary) anemia 04/12/2011  . Shingles   . Sarcoma     right hand  . Malignant neoplasm of breast (female), unspecified site     left  . Dupuytren's disease     left hand  . Vitamin B12 deficiency 04/02/2012  . Complication of anesthesia     " very cautious because of my COPD"  . Pacemaker     Past Surgical History  Procedure Date  . Nasal sinus surgery     x 3  . Appendectomy   . Hernia repair   . Abdominal hysterectomy   . Pacemaker  insertion   . Eye surgery     x6-bilateral  . Finger amputation     right  . Foot surgery     right toes x 3  . Tonsillectomy   . Colonoscopy 01/2009    w/ablation of 3 mm AVM, diverticulosis, external hemorrhoids  . Upper gastrointestinal endoscopy 01/2009    w/polypectomy, AVM, hyperplastic polyp  . Abdominal hysterectomy   . Laparotomy 11/12/2011    Procedure: EXPLORATORY LAPAROTOMY;  Surgeon: Lodema Pilot, DO;  Location: [facility];  Service: General;  Laterality: N/A;  . Colostomy revision 11/12/2011    Procedure: COLON RESECTION SIGMOID;  Surgeon: Lodema Pilot, DO;  Location: [facility];  Service: General;  Laterality: N/A;  . Colostomy 11/12/2011    Procedure: COLOSTOMY;  Surgeon: Lodema Pilot, DO;  Location: [facility];  Service: General;  Laterality: Left;  End colostomy  . Laparotomy 11/19/2011    Procedure: EXPLORATORY LAPAROTOMY;  Surgeon: Kandis Cocking, MD;  Location: [facility];  Service: General;  Laterality: N/A;  . Gastrostomy 11/19/2011    Procedure: GASTROSTOMY;  Surgeon: Kandis Cocking, MD;  Location: [facility];  Service: General;;  INSERTION GASTROSTOMY TUBE  . Application of wound vac 11/19/2011    Procedure: APPLICATION OF WOUND VAC;  Surgeon: Kandis Cocking, MD;  Location: [facility];  Service: General;  Laterality: N/A;     Recommendations for primary care physician for things to follow:   Please monitor patient's abdominal wound and colostomy closely, she is weightbearing as tolerated with walker and assistance along with fall precautions. She must wear SCDs at night. She must follow with Dr. August Saucer her orthopedic physician within a week.   Discharge Diagnoses: Pubic rami fracture     Discharge Condition: stable   Diet recommendation: See Discharge Instructions below   Consults orthopedics Dr. August Saucer, discussed with Dr. Ophelia Charter prior to discharge he agrees with present plan   History of present illness and  Hospital Course:  See H&P, Labs, Consult and Test reports for all details in  brief, patient was admitted for fall causing pubic rami fracture and soft tissue right elbow injury, patient was seen by orthopedics physician Dr. August Saucer followed by Dr. Ophelia Charter, I discussed her case with Dr. Ophelia Charter prior to her discharge, at this point patient will be treated conservatively, weight bearing as tolerated with the help of walker assistance and full fall precautions. Patient should wear SCDs at night. And followup with Dr. August Saucer in a week in his office. Clinically no fracture in the right elbow.   She has history of chronic compensated systolic heart failure last EF 30-35% along with chronic atrial fibrillation. She is allergic to aspirin and is not a Coumadin candidate, she is currently compensated beta blocker ACE inhibitor will be continued, continue 1.8 L fluid restriction keep I.'s and O.'s even, check weight daily, written instructions below. She should follow with a cardiologist in one to 2 weeks. She is currently in rate control.    History of hypothyroidism TSH this admission was 2.9 continue present dose Synthroid   History of abdominal wound status post surgery and incision site dehiscence in the past, patient has colostomy, colostomy and abdominal wound packing was changed today, continue outpatient wound care clinic followup along with her general surgery followup.   Street of GERD continue PPI.    Today   Subjective:   Darlinda Legner today has no headache,no chest abdominal pain,no new weakness tingling or numbness, feels much better wants to go home today.    Objective:   Blood pressure 124/53, pulse 78, temperature 98 F (36.7 C), temperature source Oral, resp. rate 18, height 5\' 3"  (1.6 m), weight 68.947 kg (152 lb), SpO2 99.00%.   Intake/Output Summary (Last 24 hours) at 04/23/12 1130 Last data filed at 04/23/12 0700  Gross per 24 hour  Intake    960 ml  Output      0 ml  Net    960 ml    Exam Awake Alert, Oriented *3, No new F.N deficits, Normal  affect [place].AT,PERRAL Supple Neck,No JVD, No cervical lymphadenopathy appriciated.  Symmetrical Chest wall movement, Good air movement bilaterally, CTAB RRR,No Gallops,Rubs or new Murmurs, No Parasternal Heave +ve B.Sounds, Abd Soft, Non tender, No organomegaly appriciated, No rebound -guarding or rigidity. No Cyanosis, Clubbing or edema, No new Rash or bruise, abdominal wound packed colostomy site stable    Data Review   Major procedures and Radiology Reports - PLEASE review detailed and final reports for all details in brief -  Dg Chest 2 View  04/19/2012  *RADIOLOGY REPORT*  Clinical Data: Fall, shortness of breath  CHEST - 2 VIEW  Comparison: 09/03/2011  Findings: Left upper lobe calcified granuloma.  Chronic interstitial markings.  No pleural effusion or pneumothorax.  Cardiomediastinal silhouette is within normal limits.  Left subclavian pacemaker.  Surgical clips overlying the left chest wall.  Right lateral rib fracture deformities.  IMPRESSION: No evidence of acute cardiopulmonary disease.   Original Report Authenticated By: Charline Bills, M.D.    Dg Elbow Complete Right  04/19/2012  *RADIOLOGY REPORT*  Clinical Data: History of injury from fall with posterior elbow the soft tissue laceration.  RIGHT ELBOW - COMPLETE 3+ VIEW  Comparison: None.  Findings: There is no positive fat pad sign to suggest joint effusion.  Alignment is normal.  No fracture or bony destruction is evident.  There is minimal spurring of the coronoid process of the proximal ulna.  There is slight degenerative roughening of the lateral epicondyle of the distal humerus.  A tiny calcific density is seen adjacent to the radial head.  This may reflect previous trauma.  A tiny loose body or previous chip fracture cannot be excluded.  IMPRESSION: History given of injury of the posterior aspect of the elbow with soft tissue laceration.  No fracture of the distal humerus or olecranon is seen.  No dislocation is evident.   There is no evidence of joint effusion.  There is slight degenerative roughening of the lateral epicondyle of the distal humerus. There is minimal beginning spurring of the coronoid process of the proximal ulna.  A tiny calcific density is seen adjacent to the radial head.  This may reflect previous trauma.  A tiny loose body or previous chip fracture cannot be excluded.   Original Report Authenticated By: Onalee Hua Call    Dg Hip Complete Right  04/19/2012  *RADIOLOGY REPORT*  Clinical Data: History of injury from fall with pain in the right inguinal region.  RIGHT HIP - COMPLETE 2+ VIEW  Comparison: CT 12/03/2010.  Findings: There is acute fracture of the medial aspect of the right pubic bone with inferior displacement of the medial distal fracture fragment.  There is a hairline fracture through the inferior aspect of the right ischial ramus.  No dislocation is evident. No hip fracture or dislocation is evident.  There is chronic enthesophyte spurring of the greater trochanter area bilaterally which is chronic.  There is scoliosis.  There is osteopenic appearance of bones.  There is degenerative spondylosis.  Pelvic phleboliths are seen.  Surgical staple projects inferior to the right SI joint.  IMPRESSION: Acute fractures of the right pubic ramus and right ischial ramus.  No dislocation.  No hip fracture or dislocation is evident. Osteopenic appearance of bones.  Other stable chronic findings are detailed above.   Original Report Authenticated By: Onalee Hua Call    Dg Abd Portable 1v  04/20/2012  *RADIOLOGY REPORT*  Clinical Data: Evaluate for ileus.  Pelvic fractures.  PORTABLE ABDOMEN - 1 VIEW  Comparison: 04/19/2012 and 11/11/2011 and abdominal CT 12/04/2011  Findings: There is rightward scoliosis of the lumbar spine.  Marked disc space disease at L1-L2.  There is calcification in the right upper abdomen probably associated with the anterior ribs.  There is a gas filled dilated loop of bowel in the lower mid  abdomen which may represent colon.  Small amount of bowel gas throughout the pelvic region.  IMPRESSION: Nonspecific bowel gas pattern.  Scoliosis.   Original Report Authenticated By:  Richarda Overlie, M.D.     Micro Results      Recent Results (from the past 240 hour(s))  MRSA PCR SCREENING     Status: Normal   Collection Time   04/20/12 10:00 AM      Component Value Range Status Comment   MRSA by PCR NEGATIVE  NEGATIVE Final      CBC w Diff: Lab Results  Component Value Date   WBC 7.5 04/21/2012   WBC 16.7* 04/01/2011   HGB 10.6* 04/21/2012   HGB 11.2* 04/01/2011   HCT 33.0* 04/21/2012   HCT 35.7 04/01/2011   PLT 146* 04/21/2012   PLT 229 04/01/2011   LYMPHOPCT 5* 11/28/2011   LYMPHOPCT 5.5* 04/01/2011   MONOPCT 6 11/28/2011   MONOPCT 6.7 04/01/2011   EOSPCT 0 11/28/2011   EOSPCT 0.5 04/01/2011   BASOPCT 0 11/28/2011   BASOPCT 0.2 04/01/2011    CMP: Lab Results  Component Value Date   NA 135 04/20/2012   K 4.0 04/20/2012   CL 101 04/20/2012   CO2 26 04/20/2012   BUN 13 04/20/2012   CREATININE 0.92 04/20/2012   PROT 6.0 04/19/2012   ALBUMIN 3.3* 04/19/2012   BILITOT 0.3 04/19/2012   ALKPHOS 60 04/19/2012   AST 32 04/19/2012   ALT 22 04/19/2012  .   Discharge Instructions     Follow with Primary MD [name], MD in 3 days   Get CBC, CMP, checked 3 days by Primary MD and again as instructed by your Primary MD.    Get Medicines reviewed and adjusted.  Please request your Prim.MD to go over all Hospital Tests and Procedure/Radiological results at the follow up, please get all Hospital records sent to your Prim MD by signing hospital release before you go home.  Activity: Weight bearing As tolerated with Full fall precautions use walker/cane & assistance as needed   Diet:  Heart Healthy  For Heart failure patients - Check your Weight same time everyday, if you gain over 2 pounds, or you develop in leg swelling, experience more shortness of breath or chest pain, call  your Primary MD immediately. Follow Cardiac Low Salt Diet and 1.8 lit/day fluid restriction.  Disposition SNF  If you experience worsening of your admission symptoms, develop shortness of breath, life threatening emergency, suicidal or homicidal thoughts you must seek medical attention immediately by calling 911 or calling your MD immediately  if symptoms less severe.  You Must read complete instructions/literature along with all the possible adverse reactions/side effects for all the Medicines you take and that have been prescribed to you. Take any new Medicines after you have completely understood and accpet all the possible adverse reactions/side effects.   Do not drive and provide baby sitting services if your were admitted for syncope or siezures until you have seen by Primary MD or a Neurologist and advised to do so again.  Do not drive when taking Pain medications.    Do not take more than prescribed Pain, Sleep and Anxiety Medications  Special Instructions: If you have smoked or chewed Tobacco  in the last 2 yrs please stop smoking, stop any regular Alcohol  and or any Recreational drug use.  Wear Seat belts while driving.  Follow-up Information    Follow up with [name], MD. Schedule an appointment as soon as possible for a visit in 3 days.   Contact information:   [address] [facility] [place] [address] [phone]  Follow up with Cammy Copa, MD. Schedule an appointment as soon as possible for a visit in 1 week.   Contact information:   [address] [place] [address] [phone]       Follow up with your wound care clinic and Physician. Schedule an appointment as soon as possible for a visit in 3 days.           Discharge Medications     Medication List     As of 04/23/2012 11:30 AM    CHANGE how you take these medications         HYDROcodone-acetaminophen 5-500 MG per tablet   Commonly  known as: VICODIN   Take 1 tablet by mouth every 4 (four) hours as needed for pain. For break thru pain   What changed: - how often to take the med - reasons to take the med      oxyCODONE 5 MG immediate release tablet   Commonly known as: Oxy IR/ROXICODONE   Take 1 tablet (5 mg total) by mouth every 8 (eight) hours as needed. For pain   What changed: how often to take the med      CONTINUE taking these medications         acetaminophen 325 MG tablet   Commonly known as: TYLENOL      * albuterol 108 (90 BASE) MCG/ACT inhaler   Commonly known as: PROVENTIL HFA;VENTOLIN HFA      * albuterol (2.5 MG/3ML) 0.083% nebulizer solution   Commonly known as: PROVENTIL      ALPRAZolam 0.25 MG tablet   Commonly known as: XANAX      azelastine 137 MCG/SPRAY nasal spray   Commonly known as: ASTELIN      beclomethasone 80 MCG/ACT inhaler   Commonly known as: QVAR      diphenhydrAMINE 25 mg capsule   Commonly known as: BENADRYL      DULoxetine 30 MG capsule   Commonly known as: CYMBALTA      furosemide 20 MG tablet   Commonly known as: LASIX      guaiFENesin 600 MG 12 hr tablet   Commonly known as: MUCINEX      hydroxypropyl methylcellulose 2.5 % ophthalmic solution   Commonly known as: ISOPTO TEARS      levothyroxine 50 MCG tablet   Commonly known as: SYNTHROID, LEVOTHROID      losartan 50 MG tablet   Commonly known as: COZAAR      magnesium oxide 400 MG tablet   Commonly known as: MAG-OX      metoprolol succinate 25 MG 24 hr tablet   Commonly known as: TOPROL-XL      mirtazapine 30 MG tablet   Commonly known as: REMERON      multivitamin with minerals Tabs      pravastatin 40 MG tablet   Commonly known as: PRAVACHOL      predniSONE 5 MG tablet   Commonly known as: DELTASONE      promethazine 25 MG tablet   Commonly known as: PHENERGAN      RHINOCORT AQUA NA      senna-docusate 8.6-50 MG per tablet   Commonly known as: Senokot-S      Vitamin D  (Ergocalciferol) 50000 UNITS Caps   Commonly known as: DRISDOL      zafirlukast 20 MG tablet   Commonly known as: ACCOLATE      zolpidem 10 MG tablet   Commonly known as: AMBIEN     *  Notice: This list has 2 medication(s) that are the same as other medications prescribed for you. Read the directions carefully, and ask your doctor or other care provider to review them with you.    STOP taking these medications         pantoprazole 40 MG tablet   Commonly known as: PROTONIX          Where to get your medications    These are the prescriptions that you need to pick up.   You may get these medications from any pharmacy.         HYDROcodone-acetaminophen 5-500 MG per tablet   oxyCODONE 5 MG immediate release tablet               Total Time in preparing paper work, data evaluation and todays exam - 35 minutes  Leroy Sea M.D on 04/23/2012 at 11:30 AM  [facility] Office  [phone]

## 2012-04-23 NOTE — Progress Notes (Signed)
Occupational Therapy Treatment Patient Details Name: [name] MRN: [id number] DOB: 1934/01/12 Today's Date: 04/23/2012 Time: 5956-3875 OT Time Calculation (min): 40 min  OT Assessment / Plan / Recommendation Comments on Treatment Session Pt progressing well. Performed stand pivot transfer x2 to bedside commode. Able to dress UB with supervision sitting in chair.     Follow Up Recommendations  Skilled nursing facility    Barriers to Discharge       Equipment Recommendations  None recommended by PT;None recommended by OT    Recommendations for Other Services    Frequency Min 2X/week   Plan Discharge plan remains appropriate    Precautions / Restrictions Precautions Precautions: Fall Restrictions Weight Bearing Restrictions: Yes RLE Weight Bearing: Weight bearing as tolerated LLE Weight Bearing:  (full weight bearing)   Pertinent Vitals/Pain Only pain with movement, had received pain meds prior to session     ADL  Eating/Feeding: Performed;Independent Where Assessed - Eating/Feeding: Bed level Upper Body Bathing: Simulated;Set up Where Assessed - Upper Body Bathing: Supported sitting Upper Body Dressing: Performed;Set up Where Assessed - Upper Body Dressing: Supported sitting Toilet Transfer: Performed;Minimal assistance Toilet Transfer Method: Surveyor, minerals: Materials engineer and Hygiene: Performed;Minimal assistance Where Assessed - Engineer, mining and Hygiene: Sit to stand from 3-in-1 or toilet Equipment Used: Gait belt;Rolling walker Transfers/Ambulation Related to ADLs: Performed stand pivot transfer x2 with min (A)  ADL Comments: Completed UB dressing with supervision. Completed stand pivot transfer to bedside commode with min (A) and vc's for sequencing then performed stand pivot transfer back to bed. Completes bed mobility with min (A) for RLE onto bed     OT Diagnosis:    OT Problem List:     OT Treatment Interventions:     OT Goals Acute Rehab OT Goals OT Goal Formulation: With patient Time For Goal Achievement: 05/04/12 Potential to Achieve Goals: Good ADL Goals Pt Will Perform Upper Body Bathing: with set-up;Sitting, chair;Sitting, edge of bed ADL Goal: Upper Body Bathing - Progress: Progressing toward goals Pt Will Perform Upper Body Dressing: with set-up;Sitting, chair;Sitting, bed ADL Goal: Upper Body Dressing - Progress: Met Pt Will Transfer to Toilet: with min assist;Stand pivot transfer;3-in-1;with DME;Maintaining weight bearing status ADL Goal: Toilet Transfer - Progress: Met Miscellaneous OT Goals Miscellaneous OT Goal #1: Pt will perform bed mobility with supervision in prep for EOB ADLs. OT Goal: Miscellaneous Goal #1 - Progress: Progressing toward goals Miscellaneous OT Goal #2: Pt will tolerate dynamic sitting balance tasks EOB with supervision >5 min. OT Goal: Miscellaneous Goal #2 - Progress: Progressing toward goals  Visit Information  Last OT Received On: 04/23/12 Assistance Needed: +1               Cognition  Overall Cognitive Status: Appears within functional limits for tasks assessed/performed Arousal/Alertness: Awake/alert Orientation Level: Appears intact for tasks assessed Behavior During Session: [facility] for tasks performed    Mobility  Shoulder Instructions Bed Mobility Bed Mobility: Sit to Supine;Scooting to HOB Supine to Sit: 5: Supervision Sitting - Scoot to Edge of Bed: 6: Modified independent (Device/Increase time) Sit to Supine: 4: Min assist;HOB flat Scooting to HOB: 6: Modified independent (Device/Increase time) Details for Bed Mobility Assistance: required (A) with supporting RLE while getting into bed. Able to scoot to [facility] with increased time.  Transfers Transfers: Sit to Stand;Stand to Sit Sit to Stand: 4: Min assist;With upper extremity assist;From chair/3-in-1;With armrests Stand to Sit: 4: Min assist;With upper extremity  assist;To bed;To  chair/3-in-1 Details for Transfer Assistance: Required min vc's for hand placement and increased time for sit<>stand.                  End of Session OT - End of Session Equipment Utilized During Treatment: Gait belt Activity Tolerance: Patient tolerated treatment well Patient left: in bed;with call bell/phone within reach;with family/visitor present Nurse Communication: Mobility status  GO     Cleora Fleet 04/23/2012, 12:00 PM

## 2012-04-23 NOTE — Progress Notes (Signed)
I agree with the following treatment note after reviewing documentation.   Johnston, Jeremiah Curci Brynn   OTR/L Pager: [phone] Office: [phone] .   

## 2012-04-23 NOTE — Progress Notes (Signed)
Physical Therapy Treatment Patient Details Name: [name] MRN: [id number] DOB: [birth year] Today's Date: 04/23/2012 Time: 4098-1191 PT Time Calculation (min): 32 min  PT Assessment / Plan / Recommendation Comments on Treatment Session  Patient able to transfer twice today with increased actvity tolerance and less assistance with transfer. Patient very motivated and very pleased with her progress. Anticipates DC to [facility] this afternoon.    Follow Up Recommendations  Post acute inpatient     Does the patient have the potential to tolerate intense rehabilitation  No, Recommend SNF  Barriers to Discharge        Equipment Recommendations  None recommended by PT;None recommended by OT    Recommendations for Other Services    Frequency Min 5X/week   Plan Discharge plan remains appropriate;Frequency remains appropriate    Precautions / Restrictions Precautions Precautions: Fall Restrictions Weight Bearing Restrictions: Yes RLE Weight Bearing: Weight bearing as tolerated LLE Weight Bearing:  (Full WB)   Pertinent Vitals/Pain 6/10 pelvic pain    Mobility  Bed Mobility Supine to Sit: 5: Supervision Sitting - Scoot to Edge of Bed: 6: Modified independent (Device/Increase time) Details for Bed Mobility Assistance: Patient able to long sit and manuever RLE with her hands. Still unable to move R LE on its own Transfers Sit to Stand: 4: Min assist;With upper extremity assist;From bed;From chair/3-in-1;With armrests Stand to Sit: 4: Min assist;With upper extremity assist;To chair/3-in-1;With armrests Stand Pivot Transfers: 4: Min assist Details for Transfer Assistance: A for balance and UE support. Cues for hand placement. Patient able to slide LEs with SPT this session. Cues for offsetting weigfht by using RW.  Ambulation/Gait Ambulation/Gait Assistance: Not tested (comment)    Exercises     PT Diagnosis:    PT Problem List:   PT Treatment Interventions:     PT  Goals Acute Rehab PT Goals PT Goal: Supine/Side to Sit - Progress: Progressing toward goal PT Goal: Sit to Stand - Progress: Progressing toward goal PT Goal: Stand to Sit - Progress: Progressing toward goal PT Transfer Goal: Bed to Chair/Chair to Bed - Progress: Met  Visit Information  Last PT Received On: 04/23/12 Assistance Needed: +1    Subjective Data      Cognition  Overall Cognitive Status: Appears within functional limits for tasks assessed/performed Arousal/Alertness: Awake/alert Orientation Level: Appears intact for tasks assessed Behavior During Session: [facility] for tasks performed    Balance     End of Session PT - End of Session Equipment Utilized During Treatment: Gait belt Activity Tolerance: Patient tolerated treatment well Patient left: in chair;with call bell/phone within reach Nurse Communication: Mobility status   GP     Lemoyne, Scarpati 04/23/2012, 10:46 AM 04/23/2012 Fredrich Birks PTA [phone] pager [phone] office

## 2012-04-23 NOTE — Progress Notes (Signed)
CARE MANAGEMENT NOTE 04/23/2012  Patient:  [name],[name]   Account Number:  [id number]  Date Initiated:  04/23/2012  Documentation initiated by:  Vance Peper  Subjective/Objective Assessment:   78 yr old female s/p fall with right sided pelvic fracture.     Action/Plan:   Patient will go to SNF at [facility].  Social Worker is aware.   Anticipated DC Date:  04/23/2012   Anticipated DC Plan:  SKILLED NURSING FACILITY  In-house referral  Clinical Social Worker      DC Planning Services  CM consult      Choice offered to / List presented to:             Status of service:  Completed, signed off Medicare Important Message given?   (If response is "NO", the following Medicare IM given date fields will be blank) Date Medicare IM given:   Date Additional Medicare IM given:    Discharge Disposition:  SKILLED NURSING FACILITY  Per UR Regulation:    If discussed at Long Length of Stay Meetings, dates discussed:    Comments:

## 2012-04-23 NOTE — Progress Notes (Signed)
Pain improved For tx today F/u 2 weeks

## 2012-05-14 NOTE — Progress Notes (Cosign Needed)
[facility]  NAME:  RUHEE, TURNQUIST NO.:  [id number]  MEDICAL RECORD NO.:  [id number]      DATE OF BIRTH:  1933-11-15  PHYSICIAN:  Wayland Denis, DO            VISIT DATE:                                  OFFICE VISIT   The patient is a 78 year old female who is here for followup on her abdominal nonhealing surgical ulcer.  She has been using Endoform.  She had fallen and fractured her pelvis, so she was in a nursing facility. Fortunately she has done very well with rehab and is planning on going home on Wednesday.  There has been no change in her medications or social history.  On exam, she is alert, oriented, cooperative, not in any acute distress. She is very pleasant.  Her pupils are equal.  Extraocular muscles are intact.  Her upper and lower extremity pulses are strong and regular. The ulcer is improving.  It is superficial and epithelializing from the edges, overall doing much better.  No foul odor.  No sign of infection. We will continue with the Endoform that she should keep on for the week. I will see her back in 1 week.     Wayland Denis, DO     CS/MEDQ  D:  05/14/2012  T:  05/14/2012  Job:  [id number]

## 2012-05-21 ENCOUNTER — Encounter (HOSPITAL_BASED_OUTPATIENT_CLINIC_OR_DEPARTMENT_OTHER): Payer: Medicare Other | Attending: Plastic Surgery

## 2012-05-21 ENCOUNTER — Encounter (HOSPITAL_BASED_OUTPATIENT_CLINIC_OR_DEPARTMENT_OTHER): Payer: Medicare Other

## 2012-05-21 DIAGNOSIS — K439 Ventral hernia without obstruction or gangrene: Secondary | ICD-10-CM | POA: Insufficient documentation

## 2012-05-21 DIAGNOSIS — Z9049 Acquired absence of other specified parts of digestive tract: Secondary | ICD-10-CM | POA: Insufficient documentation

## 2012-05-21 DIAGNOSIS — Y838 Other surgical procedures as the cause of abnormal reaction of the patient, or of later complication, without mention of misadventure at the time of the procedure: Secondary | ICD-10-CM | POA: Insufficient documentation

## 2012-05-21 DIAGNOSIS — T8189XA Other complications of procedures, not elsewhere classified, initial encounter: Secondary | ICD-10-CM | POA: Insufficient documentation

## 2012-05-21 DIAGNOSIS — Z933 Colostomy status: Secondary | ICD-10-CM | POA: Insufficient documentation

## 2012-05-21 NOTE — Progress Notes (Signed)
[facility]  NAME:  [name], [name] NO.:  [id number]  MEDICAL RECORD NO.:  [id number]      DATE OF BIRTH:  1933-10-16  PHYSICIAN:  Wayland Denis, DO            VISIT DATE:                                  OFFICE VISIT   The patient is a 78 year old female who is here for follow up on her abdominal nonhealing ulcer from previous surgery.  She is doing extremely well.  She is now at home, and she has been using Endoform on the area with remarkable improvement, superficial.  There is no sign of infection.  There is minimal to no drainage.  It is epithelializing very well and smaller than last week.  The exact sizes are noted in the nurse's notes.  There has been no change in her medications.  Social history has changed now that she is home and not in the nursing facility.  She does have a local family and they do check on her regularly.  REVIEW OF SYSTEMS:  Otherwise negative.  PHYSICAL EXAMINATION:  GENERAL:  She is alert, oriented, cooperative, not in any acute distress.  She is very pleasant. HEENT:  Pupils are equal.  Extraocular muscles are intact. LUNGS:  Breathing is unlabored. HEART:  Regular. ABDOMEN:  Soft.  She does have distention as expected, and the ostomy appears to be working.  The wound is noted in the nurse's note and above.  We will continue with the Endoform and see her back in followup in the next week or 2.     Wayland Denis, DO     CS/MEDQ  D:  05/21/2012  T:  05/21/2012  Job:  [id number]

## 2012-06-01 ENCOUNTER — Other Ambulatory Visit: Payer: Self-pay | Admitting: Family Medicine

## 2012-06-01 ENCOUNTER — Ambulatory Visit
Admission: RE | Admit: 2012-06-01 | Discharge: 2012-06-01 | Disposition: A | Discharge status: Home / Self Care | Payer: Medicare Other | Source: Ambulatory Visit | Attending: Family Medicine | Admitting: Family Medicine

## 2012-06-01 DIAGNOSIS — M25559 Pain in unspecified hip: Secondary | ICD-10-CM

## 2012-06-01 MED ORDER — METHYLPREDNISOLONE ACETATE 40 MG/ML INJ SUSP (RADIOLOG
120.0000 mg | Freq: Once | INTRAMUSCULAR | Status: AC
  Administered 2012-06-01: 120 mg via EPIDURAL

## 2012-06-01 MED ORDER — IOHEXOL 180 MG/ML  SOLN
1.0000 mL | Freq: Once | INTRAMUSCULAR | Status: AC | PRN
  Administered 2012-06-01: 1 mL via EPIDURAL

## 2012-06-04 NOTE — Progress Notes (Signed)
[facility]  NAME:  [name], [name]                  ACCOUNT NO.:  [id number]  MEDICAL RECORD NO.:  [id number]      DATE OF BIRTH:  10/19/1933  PHYSICIAN:  Wayland Denis, DO       VISIT DATE:  06/04/2012                                  OFFICE VISIT   The patient is a 78 year old female who is here for followup on her abdominal ulcer.  She has been using Endoform with very good improvement.  She also has a central and a lateral hernia that seems to be getting a little bit bigger.  This is a little concerning and I do recommend either a binder or a spanks-type garment with the hole cutout for her ostomy to help to decrease that pressure, that is probably also contributing to the pressure that is pushing the ulcer part as well.  On exam, she is alert, oriented, and cooperative, not in any acute distress.  She is very pleasant.  Her pupils are equal.  Extraocular muscles are intact.  No cervical lymphadenopathy.  Her breathing is unlabored.  Her heart is regular.  The wound has improved with granulation tissue at the base and epithelialization from the sides.  Recommend continuing with the Endoform and following up in a week.     Wayland Denis, DO     CS/MEDQ  D:  06/04/2012  T:  06/04/2012  Job:  [id number]

## 2012-06-11 NOTE — Progress Notes (Signed)
[facility]  NAME:  [name], [name]                  ACCOUNT NO.:  [id number]  MEDICAL RECORD NO.:  [id number]      DATE OF BIRTH:  10/26/1933  PHYSICIAN:  Wayland Denis, DO       VISIT DATE:  06/11/2012                                  OFFICE VISIT   The patient is a 78 year old female who is here for followup on her abdominal postsurgical nonhealing wound.  We have been using Endoform with a little improvement particularly in the depth, but not so much in the width.  She still has got significant hernia.  She did not get a binder over the last week and has various reasons for why she was not able to do that.  She is concerned about her ostomy.  On exam, she is alert, oriented, cooperative, not in any acute distress. She is pleasant.  Pupils are equal.  Extraocular muscles are intact.  No cervical lymphadenopathy.  Her breathing is unlabored.  Her heart is regular.  Her wound is described above.  Due to the slow progress, I think she is an excellent candidate for Oasis and we will go ahead and apply that with Adaptic and see her back in follow up.  We will also get an appointment with Dr. Biagio Quint for evaluation of the ostomy.     Wayland Denis, DO     CS/MEDQ  D:  06/11/2012  T:  06/11/2012  Job:  [id number]

## 2012-06-14 ENCOUNTER — Other Ambulatory Visit: Payer: Self-pay | Admitting: Physician Assistant

## 2012-06-15 ENCOUNTER — Ambulatory Visit (INDEPENDENT_AMBULATORY_CARE_PROVIDER_SITE_OTHER): Payer: Medicare Other | Admitting: General Surgery

## 2012-06-15 VITALS — BP 130/78 | HR 80 | Temp 97.4°F | Resp 20 | Ht 64.0 in | Wt 149.0 lb

## 2012-06-15 DIAGNOSIS — J449 Chronic obstructive pulmonary disease, unspecified: Secondary | ICD-10-CM

## 2012-06-15 DIAGNOSIS — K435 Parastomal hernia without obstruction or  gangrene: Secondary | ICD-10-CM

## 2012-06-15 NOTE — Progress Notes (Signed)
Subjective:     Patient ID: [name], female   DOB: Aug 31, 1933, 78 y.o.   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 This was complicated with postoperative wound dehiscence. She has bad COPD and was recently on a steroid pulse and she is currently followed in the wound care center for treatment of her midline wound. She also is in the hospital recently after falling and hurting her pelvis. She comes in today to have some questions about ventral hernia and parastomal hernias. She says that she is not having any difficulty sealing her ostomy although the ostomy has increased in diameter. She does have constipation and takes laxatives for this but she also continues on pain medication to 2 left thigh and back pain. She also has some return of some chills and sweats in the afternoon which were present before her surgery but they recently returned.  Review of Systems     Objective:   Physical Exam Her midline incision still has a healing midline wound with what sounds like a biologic dressing. She has a notable midline ventral hernia as well as a moderate-sized parastomal hernia. There is a good seal around her ostomy and there is no tenderness in her abdomen or around her stoma.    Assessment:     Peristomal hernia She has no evidence of obstruction or strangulation and she is now having difficulty sealing her ostomy. I explained that though we could repair this, since she is not really having any symptoms from this I think that the risk of surgery of for her would be too great. Now she becomes symptomatic from this resetting obstructive symptoms, then we may reconsider. Midline wound Continue current wound care and wound center Thigh and back pain I think that this is musculoskeletal in order and I recommended that she followup withher orthopedic surgeon for evaluation.    Plan:     Overall I think  that she is doing okay. She has had quite a few setbacks recently but she is high-risk for any procedures. I would not plan on doing any elective ventral hernia or parastomal hernia repair on her unless she has any issues. I discussed the possibility of a abdominal binder which may help her peristomal hernia.

## 2012-06-15 NOTE — Telephone Encounter (Signed)
Have Dr. Lubertha Basque RN review for correct dosage. 03/19/12 GT ov tartrate 25 bid; 04/23/12 D/C states XL 25 qd; 06/14/12 rx req states XL 50 qd. Please verify w/DR. What dose pt should be on.

## 2012-06-15 NOTE — Telephone Encounter (Signed)
Spoke with patients husband, she is taking 50mg  daily

## 2012-06-15 NOTE — Addendum Note (Signed)
Addended by: Dennis Bast F on: 06/15/2012 11:27 AM   Modules accepted: Orders

## 2012-06-19 ENCOUNTER — Other Ambulatory Visit: Payer: Self-pay | Admitting: Orthopedic Surgery

## 2012-06-19 DIAGNOSIS — M25559 Pain in unspecified hip: Secondary | ICD-10-CM

## 2012-06-19 DIAGNOSIS — R102 Pelvic and perineal pain: Secondary | ICD-10-CM

## 2012-06-19 DIAGNOSIS — M545 Low back pain: Secondary | ICD-10-CM

## 2012-06-22 ENCOUNTER — Ambulatory Visit
Admission: RE | Admit: 2012-06-22 | Discharge: 2012-06-22 | Disposition: A | Discharge status: Home / Self Care | Payer: Medicare Other | Source: Ambulatory Visit | Attending: Orthopedic Surgery | Admitting: Orthopedic Surgery

## 2012-06-22 DIAGNOSIS — R102 Pelvic and perineal pain: Secondary | ICD-10-CM

## 2012-06-22 DIAGNOSIS — M25559 Pain in unspecified hip: Secondary | ICD-10-CM

## 2012-06-22 DIAGNOSIS — M545 Low back pain: Secondary | ICD-10-CM

## 2012-06-24 ENCOUNTER — Encounter: Payer: Self-pay | Admitting: Internal Medicine

## 2012-06-25 ENCOUNTER — Ambulatory Visit (INDEPENDENT_AMBULATORY_CARE_PROVIDER_SITE_OTHER): Payer: Medicare Other | Admitting: *Deleted

## 2012-06-25 ENCOUNTER — Encounter (HOSPITAL_BASED_OUTPATIENT_CLINIC_OR_DEPARTMENT_OTHER): Payer: Medicare Other | Attending: Plastic Surgery

## 2012-06-25 DIAGNOSIS — I4891 Unspecified atrial fibrillation: Secondary | ICD-10-CM

## 2012-06-25 DIAGNOSIS — K458 Other specified abdominal hernia without obstruction or gangrene: Secondary | ICD-10-CM | POA: Insufficient documentation

## 2012-06-25 DIAGNOSIS — Z950 Presence of cardiac pacemaker: Secondary | ICD-10-CM

## 2012-06-25 DIAGNOSIS — Y838 Other surgical procedures as the cause of abnormal reaction of the patient, or of later complication, without mention of misadventure at the time of the procedure: Secondary | ICD-10-CM | POA: Insufficient documentation

## 2012-06-25 DIAGNOSIS — T8189XA Other complications of procedures, not elsewhere classified, initial encounter: Secondary | ICD-10-CM | POA: Insufficient documentation

## 2012-06-26 NOTE — Progress Notes (Signed)
[facility]  NAME:  [name], [name]                  ACCOUNT NO.:  [id number]  MEDICAL RECORD NO.:  [id number]      DATE OF BIRTH:  02-01-1934  PHYSICIAN:  Wayland Denis, DO       VISIT DATE:  06/25/2012                                  OFFICE VISIT   The patient is a 79 year old female who is here for followup on her abdominal ulcer.  We have been doing Oasis and she is showing some improvement.  There has been no change in her medications or social history.  PHYSICAL EXAMINATION:  GENERAL:  On exam, she is alert, oriented, cooperative, not in any acute distress.  She is pleasant.  HEENT: Pupils are equal.  Extraocular muscles intact.  No cervical lymphadenopathy.  Her wound size is improving.  The surrounding areas does not show any sign of infection.  Oasis was applied according to the manufacture guidelines.  It was hydrated with normal saline and dressed with collagen.  She tolerated the procedure well.  There were no complications.  I will see her back in 1 week.     Wayland Denis, DO     CS/MEDQ  D:  06/25/2012  T:  06/26/2012  Job:  [id number]

## 2012-06-27 LAB — REMOTE PACEMAKER DEVICE
AL AMPLITUDE: 2.8 mv
ATRIAL PACING PM: 9
BAMS-0001: 175 {beats}/min
BATTERY VOLTAGE: 2.73 V
RV LEAD IMPEDENCE PM: 603 Ohm
VENTRICULAR PACING PM: 100

## 2012-07-03 ENCOUNTER — Encounter: Payer: Self-pay | Admitting: *Deleted

## 2012-07-03 NOTE — Progress Notes (Signed)
[facility]  NAME:  [name], [name]                  ACCOUNT NO.:  [id number]  MEDICAL RECORD NO.:  [id number]      DATE OF BIRTH:  09-26-1933  PHYSICIAN:  Wayland Denis, DO       VISIT DATE:  07/02/2012                                  OFFICE VISIT   The patient is a 79 year old female who is here for followup on her abdominal ulcer.  We were using Oasis with some improvement although slow.  There has been no change in her medications or social history.  On exam, she is alert, oriented, cooperative, not in any acute distress. She is pleasant.  Pupils are equal.  Extraocular muscles were intact. No cervical lymphadenopathy.  Her breathing is unlabored.  Her heart is regular.  Her abdomen is soft.  Her abdomen is enlarged and she does have a large hernia.  The wound size is improving.  The depth is essentially unchanged.  We are going to try Silvercel on it for this week, and see her back in followup.     Wayland Denis, DO     CS/MEDQ  D:  07/02/2012  T:  07/03/2012  Job:  [id number]

## 2012-07-04 ENCOUNTER — Telehealth: Payer: Self-pay | Admitting: *Deleted

## 2012-07-04 NOTE — Telephone Encounter (Signed)
Call from nurse at Dr. Threasa Beards office wants to add on lab work to labs already ordered by Dr. Gaylyn Rong, which are being drawn by nurse at [facility].  Suggested she call [facility] to give order for additional labs (TSH, T4).  She will try that.

## 2012-07-05 ENCOUNTER — Encounter: Payer: Self-pay | Admitting: Emergency Medicine

## 2012-07-05 ENCOUNTER — Ambulatory Visit (INDEPENDENT_AMBULATORY_CARE_PROVIDER_SITE_OTHER): Payer: Medicare Other | Admitting: Emergency Medicine

## 2012-07-05 VITALS — BP 120/64 | HR 101 | Temp 98.8°F | Ht 64.0 in | Wt 153.4 lb

## 2012-07-05 DIAGNOSIS — J45909 Unspecified asthma, uncomplicated: Secondary | ICD-10-CM

## 2012-07-05 DIAGNOSIS — J309 Allergic rhinitis, unspecified: Secondary | ICD-10-CM

## 2012-07-05 NOTE — Progress Notes (Signed)
Subjective:    Patient ID: [name], female    DOB: 1933-08-31, 79 y.o.   MRN: [id number] HPI 79 yo woman, former smoker, carries hx of asthma dx in 1989 at [facility]. In retrospect probably had childhood asthma as well. Also with allergies, reported VCD (with speech rx in the past). She has been treated with bronchodilators, ICS, systemic steroids x 27 years (c/b adrenal insuff when she has stopped), on Xolair per Dr Lucie Leather - feels better on the xolair. Not on immunotherapy, has been in distant past. Currently on Spiriva + QVAR. 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 Seen at [facility] April '11 for her cough. CT scan perfomed there to eval bronchiectasis. Then referred to speech therapy [facility] , received botox UA injection with improvement in symptoms.  Has been rx agressively for GERD. She keeps a hoarse voice, a chronic cough. Presents today regarding the cough.   ROV 05/04/10 -- Hx VCD, asthma, allergies. She had Botox injections 6 weeks ago, may have been beneficial but it causes some swallowing difficulties. Still w cough, voice hoarse. Her nasal drainage has worsened since last visit, as has the cough. Started the loratadine last time, is on rhinocort, never started the NSWs, taking pantoprazole. Was never referred to speech therapy. We stopped Spiriva and she doesn't miss it (but cough remained).   ROV 12/29/10 -- returns for VCD, asthma, allergies. Tells me that she was admitted last November for ? Bronchitis, treated with abx and steroids. Her baseline pred dose is 7.5mg  qd. Stable xolair dosing w Dr Lucie Leather. Takes QVAR bid. Tells me that she has ben evaluated by Dr Felipa Eth and Dr Leone Payor for abd pain, ? Diverticular dz - course then c/b C. Diff colitis. CT scan showed a small abscess assoc with diverticular dz - currently on vanco PO + cipro/flagyl.   Acute OV  11/04/11 -  Complains of increased SOB, prod cough with clear mucus primarily at night x58months  - finished zithromycin and cipro for diverticulitis 3 days ago. Not using anything for cough. Some post nasal drainage., no overt reflux, no fever or discolored mucus.  On Prevacid daily  And zantac At bedtime   Has hx of C. Diff.  Currently on Xolair but missed last month due to family illness.  Prednisone dose is 7.5mg   Not taking Spiriva any longer  Remains on QVAR Twice daily   No longer on Botox injection at voice center.  Has a lot of post nasal drip esp at night.  No fever , hemoptysis or edema.   ROV 02/07/12 -- returns for VCD, asthma, allergies.  She is s/p surgery for diverticular abscess, complicated by dehiscence and repeat surgery. She was at [facility] and then [facility]. Her pred was temporarily increased. Hasn't had QVAR since May. Her Rhinocort was changed to Astelin. She is still on Advair. Prevacid changed to Protonix. No Xolair since march, or botox any time recently, she believes she misses the xolair.   ROV 07/05/12 -- returns for VCD, asthma, allergies.  Followed also by Dr Lucie Leather, has been on Xolair before, stopped 3/13. Last time restarted QVAR + albuterol prn + pred 7.5; allergy regimen = rhinocort, astelin, accolate. She describes a terrible year - diverticular sgy, colostomy, etc. Fell and had pelvic fx. Has intermittently had dyspnea/cough/raspy voice.      Objective:   Physical Exam Filed Vitals:   07/05/12 1330  BP: 120/64  Pulse: 101  Temp: 98.8 F (37.1 C)    Gen: Pleasant, ill-appearing, using a walker, in no distress,  normal affect  ENT: No lesions,  mouth clear,  oropharynx clear, no postnasal drip  Neck: No JVD, no TMG, no carotid bruits  Lungs: no wheeze, some very rare referred UA noise  Cardiovascular: RRR, heart sounds normal, no murmur or gallops, no peripheral edema  Musculoskeletal: No deformities, no cyanosis or clubbing  Neuro: alert, non focal  Skin: Warm, no lesions or rashes       Assessment & Plan:  ASTHMA - continue to use QVAR  bid, albuterol prn - O2 at night - appears to be stable off xolair - rov 6 months   ALLERGIC RHINITIS Continue accolate, nasal steroid, astelin

## 2012-07-05 NOTE — Assessment & Plan Note (Signed)
Continue accolate, nasal steroid, astelin

## 2012-07-05 NOTE — Assessment & Plan Note (Signed)
-   continue to use QVAR bid, albuterol prn - O2 at night - appears to be stable off xolair - rov 6 months

## 2012-07-05 NOTE — Patient Instructions (Addendum)
Continue your rhinocort, astelin, accolate Consider restarting nasal saline washes daily Use QVAR twice a day Albuterol as needed Follow with Dr Delton Coombes in 6 months or sooner if you have any problems

## 2012-07-09 NOTE — Progress Notes (Signed)
[facility]  NAME:  [name], [name]                  ACCOUNT NO.:  [id number]  MEDICAL RECORD NO.:  [id number]      DATE OF BIRTH:  15-Apr-1934  PHYSICIAN:  Wayland Denis, DO       VISIT DATE:  07/09/2012                                  OFFICE VISIT   The patient is a 79 year old female that is here for followup on her abdominal ulcer.  We have been using Oasis and then recently Silvercel. She is doing a little bit better.  There seems to be a slight improvement.  There is no sign of infection.  She is alert, oriented, cooperative, not in any acute distress.  She is pleasant.  Pupils are equal.  Extraocular muscles are intact.  No cervical lymphadenopathy. Her breathing is unlabored.  Her heart is regular.  The wound does not show sign of infection, just slow progress.  Recommend continuing with the Silvercel and have her follow up here in the office.  In the meantime, to continue with her protein diet and her binder, and we will see her back in followup.     Wayland Denis, DO     CS/MEDQ  D:  07/09/2012  T:  07/09/2012  Job:  [id number]

## 2012-07-23 ENCOUNTER — Encounter (HOSPITAL_BASED_OUTPATIENT_CLINIC_OR_DEPARTMENT_OTHER): Payer: Medicare Other | Attending: Plastic Surgery

## 2012-08-06 ENCOUNTER — Encounter (HOSPITAL_BASED_OUTPATIENT_CLINIC_OR_DEPARTMENT_OTHER): Payer: Medicare Other | Attending: Plastic Surgery

## 2012-08-06 DIAGNOSIS — L98499 Non-pressure chronic ulcer of skin of other sites with unspecified severity: Secondary | ICD-10-CM | POA: Insufficient documentation

## 2012-08-06 DIAGNOSIS — K469 Unspecified abdominal hernia without obstruction or gangrene: Secondary | ICD-10-CM | POA: Insufficient documentation

## 2012-08-06 NOTE — Progress Notes (Signed)
[facility]  NAME:  RAYLEY, GAO                  ACCOUNT NO.:  [id number]  MEDICAL RECORD NO.:  [id number]      DATE OF BIRTH:  [birth year]  PHYSICIAN:  Wayland Denis, DO       VISIT DATE:  08/06/2012                                  OFFICE VISIT   The patient is a 79 year old female with an abdominal ulcer that is chronic.  She has been using Silvercel.  It had gotten better and then it is unfortunately thickened now.  She has a lot of issues with immobility and unable to lose weight, hernia that apparently is inoperable.  There has been no change in her medications or social history otherwise.  On exam, she is alert, oriented, cooperative, not in any acute distress. When I pinched the area to include all of the thin skin it is very tight.  I do think if she lost 20 pounds we would be able to cut that out, but she still has the hernia and there is a very good chance of breakdown again.  So my recommendation is Nutrition consult, goal of weight loss, protein intake, multivitamin, vitamin C binder, and continue with Silvercel.  We will see her back in follow up in a month.     Wayland Denis, DO     CS/MEDQ  D:  08/06/2012  T:  08/06/2012  Job:  [id number]

## 2012-08-28 ENCOUNTER — Ambulatory Visit: Payer: Medicare Other | Admitting: Dietician

## 2012-09-03 ENCOUNTER — Encounter (HOSPITAL_BASED_OUTPATIENT_CLINIC_OR_DEPARTMENT_OTHER): Payer: Medicare Other

## 2012-09-05 ENCOUNTER — Other Ambulatory Visit: Payer: Self-pay | Admitting: Allergy and Immunology

## 2012-09-05 ENCOUNTER — Ambulatory Visit
Admission: RE | Admit: 2012-09-05 | Discharge: 2012-09-05 | Disposition: A | Discharge status: Home / Self Care | Payer: Medicare Other | Source: Ambulatory Visit | Attending: Allergy and Immunology | Admitting: Allergy and Immunology

## 2012-09-05 DIAGNOSIS — R05 Cough: Secondary | ICD-10-CM

## 2012-09-17 ENCOUNTER — Encounter (HOSPITAL_BASED_OUTPATIENT_CLINIC_OR_DEPARTMENT_OTHER): Payer: Medicare Other | Attending: Plastic Surgery

## 2012-09-17 DIAGNOSIS — L98499 Non-pressure chronic ulcer of skin of other sites with unspecified severity: Secondary | ICD-10-CM | POA: Insufficient documentation

## 2012-09-18 NOTE — Progress Notes (Signed)
[facility]  NAME:  [name], [name]                  ACCOUNT NO.:  [id number]  MEDICAL RECORD NO.:  [id number]      DATE OF BIRTH:  09/26/1933  PHYSICIAN:  Wayland Denis, DO       VISIT DATE:  09/17/2012                                  OFFICE VISIT   The patient is a 79 year old female who is here for chronic abdominal ulcer.  She has been using Silvercel and is at this point completely healed.  She has done extremely well.  She has improved on her appetite as well.  On exam, she is alert, oriented, cooperative, not in any acute distress.  She is pleasant.  Pupils are equal.  Extraocular muscles are intact.  Her abdomen is distended due to the hernia but otherwise nontender.  The area has completely healed.  There has been no change in her medications, social history.  Her husband is with her and is a great support.  We have recommended that she continue to keep it padded and use abdominal support of some sort or here if she needs this but otherwise she has healed.     Wayland Denis, DO     CS/MEDQ  D:  09/17/2012  T:  09/18/2012  Job:  [id number]

## 2012-09-24 ENCOUNTER — Encounter: Payer: Medicare Other | Admitting: *Deleted

## 2012-10-01 ENCOUNTER — Encounter: Payer: Self-pay | Admitting: *Deleted

## 2012-11-02 ENCOUNTER — Encounter: Payer: Self-pay | Admitting: Internal Medicine

## 2012-11-02 ENCOUNTER — Ambulatory Visit (INDEPENDENT_AMBULATORY_CARE_PROVIDER_SITE_OTHER): Payer: Medicare Other | Admitting: Internal Medicine

## 2012-11-02 VITALS — BP 142/86 | HR 86 | Temp 98.9°F

## 2012-11-02 DIAGNOSIS — J45909 Unspecified asthma, uncomplicated: Secondary | ICD-10-CM

## 2012-11-02 MED ORDER — OXYCODONE HCL 5 MG PO TABS: 5.0000 mg | ORAL_TABLET | ORAL | Status: DC | PRN

## 2012-11-02 MED ORDER — FLUTTER DEVI: Status: DC

## 2012-11-02 NOTE — Patient Instructions (Addendum)
Use flutter valve as much as possible  mucinex dm 1200 mg every 12 hours ok to supplement with oxycodone up to  2 every 4 hours and stop tussionex  dexilant 60 mg 30 min before bfast every day and pepcid 20 mg one daily   Stop spiriva as you don't have significant copd and you never will.  GERD (REFLUX)  is an extremely common cause of respiratory symptoms, many times with no significant heartburn at all.    It can be treated with medication, but also with lifestyle changes including avoidance of late meals, excessive alcohol, smoking cessation, and avoid fatty foods, chocolate, peppermint, colas, red wine, and acidic juices such as orange juice.  NO MINT OR MENTHOL PRODUCTS SO NO COUGH DROPS  USE SUGARLESS CANDY INSTEAD (jolley ranchers or Stover's)  NO OIL BASED VITAMINS - use powdered substitutes.

## 2012-11-02 NOTE — Progress Notes (Signed)
Subjective:    Patient ID: [name], female    DOB: [birth year]    MRN: [id number] HPI 79 yowf  quit smoking age 79 with life long cough that did not improve with smoking cessation  carries hx of asthma dx in 1989 at [facility]. In retrospect probably had childhood asthma as well. Also with allergies, reported VCD (with speech rx in the past). She has been treated with bronchodilators, ICS, systemic steroids x 27 years (c/b adrenal insuff when she has stopped), on Xolair per Dr Lucie Leather - feels better on the xolair. Not on immunotherapy, has been in distant past. Currently on Spiriva + QVAR. 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 Seen at [facility] April '11 for her cough. CT scan perfomed there to eval bronchiectasis. Then referred to speech therapy [facility] , received botox UA injection with improvement in symptoms.  Has been rx agressively for GERD. She keeps a hoarse voice, a chronic cough.   ROV 77/15/11 -- Hx VCD, asthma, allergies. She had Botox injections 6 weeks ago, may have been beneficial but it causes some swallowing difficulties. Still w cough, voice hoarse. Her nasal drainage has worsened since last visit, as has the cough. Started the loratadine last time, is on rhinocort, never started the NSWs, taking pantoprazole. Was never referred to speech therapy. We stopped Spiriva and she doesn't miss it (but cough remained).   ROV 12/29/10 -- returns for VCD, asthma, allergies. Tells me that she was admitted last November for ? Bronchitis, treated with abx and steroids. Her baseline pred dose is 7.5mg  qd. Stable xolair dosing w Dr Lucie Leather. Takes QVAR bid. Tells me that she has ben evaluated by Dr Felipa Eth and Dr Leone Payor for abd pain, ? Diverticular dz - course then c/b C. Diff colitis. CT scan showed a small abscess assoc with diverticular dz - currently on vanco PO + cipro/flagyl.   Acute OV  11/04/11 -  Complains of increased SOB, prod cough with clear mucus  primarily at night x80months - finished zithromycin and cipro for diverticulitis 3 days ago. Not using anything for cough. Some post nasal drainage., no overt reflux, no fever or discolored mucus.  On Prevacid daily  And zantac At bedtime   Has hx of C. Diff.  Currently on Xolair but missed last month due to family illness.  Prednisone dose is 7.5mg   Not taking Spiriva any longer  Remains on QVAR Twice daily   No longer on Botox injection at voice center.  Has a lot of post nasal drip esp at night.  No fever , hemoptysis or edema.   ROV 02/07/12 -- returns for VCD, asthma, allergies.  She is s/p surgery for diverticular abscess, complicated by dehiscence and repeat surgery. She was at [facility] and then [facility]. Her pred was temporarily increased. Hasn't had QVAR since May. Her Rhinocort was changed to Astelin. She is still on Advair. Prevacid changed to Protonix. No Xolair since march, or botox any time recently, she believes she misses the xolair.   ROV 07/05/12 -- returns for VCD, asthma, allergies.  Followed also by Dr Lucie Leather, has been on Xolair before, stopped 3/13. Last time restarted QVAR + albuterol prn + pred 7.5; allergy regimen = rhinocort, astelin, accolate. She describes a terrible year - diverticular sgy, colostomy, etc. Fell and had pelvic fx. Has intermittently had dyspnea/cough/raspy voice.  rec Continue your rhinocort, astelin, accolate Consider restarting nasal saline washes daily Use QVAR  twice a day Albuterol as needed  11/02/2012 on maint pred at 7.5 mg pred and noct 02 acute w/in ov/Allaina Brotzman 2 bad spells treated by Kozlow the 1st lasted several weeks then the 2nd started 3 weeks prior to OV  zpak and spiriva got worse rx prednisone 20 mg daily and started augmentin still on spiriva (see above as it was rec she stop) and stopped astelin,  Main issue is cough / choking  Less likely to happen sleeping.  Started on dexilant by [employer] / doesn't use flutter  No obvious daytime variabilty or  assoc   cp or chest tightness, subjective wheeze overt sinus or hb symptoms. No unusual exp hx or h/o childhood pna/ asthma or premature birth to her knowledge.   Sleeping ok without nocturnal  or early am exacerbation  of respiratory  c/o's or need for noct saba. Also denies any obvious fluctuation of symptoms with weather or environmental changes or other aggravating or alleviating factors except as outlined above   Current Medications, Allergies, Past Medical History, Past Surgical History, Family History, and Social History were reviewed in [employer] record.  ROS  The following are not active complaints unless bolded sore throat, dysphagia, dental problems, itching, sneezing,  nasal congestion or excess/ purulent secretions, ear ache,   fever, chills, sweats, unintended wt loss, pleuritic or exertional cp, hemoptysis,  orthopnea pnd or leg swelling, presyncope, palpitations, heartburn, abdominal pain, anorexia, nausea, vomiting, diarrhea  or change in bowel or urinary habits, change in stools or urine, dysuria,hematuria,  rash, arthralgias, visual complaints, headache, numbness weakness or ataxia or problems with walking or coordination,  change in mood/affect or memory.         Objective:   Physical Exam   Gen: cushingnoid  ill-appearing, on 02 in w/c , in no distress,  normal affect audible pseudowheeze  ENT: No lesions,  mouth clear,  oropharynx clear, no postnasal drip  Neck: No JVD, no TMG, no carotid bruits  Lungs: no wheeze, some referred UA noise  Cardiovascular: RRR, heart sounds normal, no murmur or gallops, no peripheral edema  Musculoskeletal: No deformities, no cyanosis or clubbing  Neuro: alert, non focal  Skin: Warm, no lesions or rashes       Assessment & Plan:

## 2012-11-03 ENCOUNTER — Encounter: Payer: Self-pay | Admitting: Internal Medicine

## 2012-11-03 NOTE — Assessment & Plan Note (Signed)
-   PFTs 09/24/07 with mild obst but f/v contour mostly abn in the effort dep portion  Lack of symptom change with smoking cessation, lack of response to spiriva and present exam all strongly asthma over copd and pseudoasthma over asthma as the most pressing issues  I had an extended discussion with the patient today lasting 15 to 20 minutes of a 25 minute visit on the following issues:   Key is stoppping the cycle of coughing >  Upper air way trauma > inflammation > more cough   See instructions for specific recommendations which were reviewed directly with the patient who was given a copy with highlighter outlining the key components.

## 2012-11-08 ENCOUNTER — Encounter: Payer: Self-pay | Admitting: Internal Medicine

## 2012-11-08 ENCOUNTER — Ambulatory Visit (INDEPENDENT_AMBULATORY_CARE_PROVIDER_SITE_OTHER)
Admission: RE | Admit: 2012-11-08 | Discharge: 2012-11-08 | Disposition: A | Discharge status: Home / Self Care | Payer: Medicare Other | Source: Ambulatory Visit | Attending: Internal Medicine | Admitting: Internal Medicine

## 2012-11-08 ENCOUNTER — Ambulatory Visit (INDEPENDENT_AMBULATORY_CARE_PROVIDER_SITE_OTHER): Payer: Medicare Other | Admitting: Internal Medicine

## 2012-11-08 VITALS — BP 182/98 | HR 108 | Temp 98.1°F

## 2012-11-08 DIAGNOSIS — J9600 Acute respiratory failure, unspecified whether with hypoxia or hypercapnia: Secondary | ICD-10-CM

## 2012-11-08 DIAGNOSIS — J45909 Unspecified asthma, uncomplicated: Secondary | ICD-10-CM

## 2012-11-08 DIAGNOSIS — J9610 Chronic respiratory failure, unspecified whether with hypoxia or hypercapnia: Secondary | ICD-10-CM

## 2012-11-08 NOTE — Patient Instructions (Addendum)
Take xanax 0.25 four times daily automatically for now   Add prednisone 20 mg per day to your routine just through the memorial day weekend   Please remember to go to the  x-ray department downstairs for your tests - we will call you with the results when they are available.    For cough as needed  mucinex dm 1200 mg with vicodin up to 2 every  4 hours   For breathing difficulty use the neb albuterol 2.5 mg up to 4 hours   See Tammy NP w/in 2 weeks with all your medications, even over the counter meds, separated in two separate bags, the ones you take no matter what vs the ones you stop once you feel better and take only as needed when you feel you need them.   Tammy  will generate for you a new user friendly medication calendar that will put [place] all on the same page re: your medication use.     Without this process, it simply isn't possible to assure that we are providing  your outpatient care  with  the attention to detail we feel you deserve.   If we cannot assure that you're getting that kind of care,  then we cannot manage your problem effectively from this clinic.  Once you have seen Tammy and we are sure that we're all on the same page with your medication use she will arrange follow up with me.

## 2012-11-08 NOTE — Progress Notes (Signed)
Subjective:    Patient ID: [name], female    DOB: May 31, 1934    MRN: [id number] HPI 79 yowf  quit smoking age 79 with life long cough that did not improve with smoking cessation  carries hx of asthma dx in 1989 at [facility]. In retrospect probably had childhood asthma as well. Also with allergies, reported VCD (with speech rx in the past). She has been treated with bronchodilators, ICS, systemic steroids x 27 years (c/b adrenal insuff when she has stopped), on Xolair per Dr Lucie Leather -  better on the [facility]. Not on immunotherapy, has been in distant past.  seen here by Dr Shelle Iron in 2009, PFT at that time with mild AFL, no BD response. Seen at [facility] April '11 for her cough. CT scan perfomed there to eval bronchiectasis. Then referred to [facility] , received botox UA injection with improvement in symptoms.  Has been rx agressively for GERD. She keeps a hoarse voice, a chronic cough.   ROV 05/04/10 -- Hx VCD, asthma, allergies. She had Botox injections 6 weeks ago, may have been beneficial but it causes some swallowing difficulties. Still w cough, voice hoarse. Her nasal drainage has worsened since last visit, as has the cough. Started the loratadine last time, is on rhinocort, never started the NSWs, taking pantoprazole. Was never referred to speech therapy.   stopped Spiriva and she doesn't miss it (but cough remained).      ROV 07/05/12 -- returns for VCD, asthma, allergies.  Followed also by Dr Lucie Leather, has been on Xolair before, stopped 3/13. Last time restarted QVAR + albuterol prn + pred 7.5; allergy regimen = rhinocort, astelin, accolate. She describes a terrible year - diverticular sgy, colostomy, etc. Fell and had pelvic fx. Has intermittently had dyspnea/cough/raspy voice.  rec Continue your rhinocort, astelin, accolate Consider restarting nasal saline washes daily Use QVAR twice a day Albuterol as needed  11/02/2012 on maint pred at 7.5 mg pred and noct 02 acute w/in ov/[name] 2 bad  spells treated by Kozlow the 1st lasted several weeks then the 2nd started 3 weeks prior to OV  zpak and spiriva got worse rx prednisone 20 mg daily and started augmentin still on spiriva (see above as it was rec she stop) and stopped astelin,  Main issue is cough / choking  Less likely to happen sleeping.  Started on dexilant by [employer] / doesn't use flutter rec Use flutter valve as much as possible mucinex dm 1200 mg every 12 hours ok to supplement with oxycodone up to  2 every 4 hours and stop tussionex dexilant 60 mg 30 min before bfast every day and pepcid 20 mg one daily  Stop spiriva as you don't have significant copd and you never will. GERD diet   11/08/2012 f/u ov/[name] re vcd/ prednisone down maintenance dose 7.5 floor  Chief Complaint  Patient presents with  . Acute Visit    Increased SOB for the past 2-3 days, not able to walk any distance without getting completely out of breath. Her cough is some better since last wk, but still having non prod cough.   better if puts ice on neck, worse day than night p takes vicodin and able to lie in bed rather than sleeping in recliner, xanax helps transiently "but doesn't want to take it unless needed" no excess mucus production.   No obvious daytime variabilty or assoc   cp or chest tightness, subjective wheeze overt sinus or hb symptoms. No unusual exp hx or  h/o childhood pna/ asthma or premature birth to her knowledge.   Sleeping ok without nocturnal  or early am exacerbation  of respiratory  c/o's or need for noct saba. Also denies any obvious fluctuation of symptoms with weather or environmental changes or other aggravating or alleviating factors except as outlined above   Current Medications, Allergies, Past Medical History, Past Surgical History, Family History, and Social History were reviewed in [employer] record.  ROS  The following are not active complaints unless bolded sore throat, dysphagia, dental  problems, itching, sneezing,  nasal congestion or excess/ purulent secretions, ear ache,   fever, chills, sweats, unintended wt loss, pleuritic or exertional cp, hemoptysis,  orthopnea pnd or leg swelling, presyncope, palpitations, heartburn, abdominal pain, anorexia, nausea, vomiting, diarrhea  or change in bowel or urinary habits, change in stools or urine, dysuria,hematuria,  rash, arthralgias, visual complaints, headache, numbness weakness or ataxia or problems with walking or coordination,  change in mood/affect or memory.         Objective:   Physical Exam  Wt Readings from Last 3 Encounters:  07/05/12 153 lb 6.4 oz (69.582 kg)  06/15/12 149 lb (67.586 kg)  04/22/12 152 lb (68.947 kg)     Gen: cushingnoid  ill-appearing, on 02 in w/c ,  audible pseudowheeze  ENT: No lesions,  mouth clear,  oropharynx clear, no postnasal drip  Neck: No JVD, no TMG, no carotid bruits  Lungs: no wheeze, some referred UA noise  Cardiovascular: RRR, heart sounds normal, no murmur or gallops, no peripheral edema  Musculoskeletal: No deformities, no cyanosis or clubbing  Neuro: alert, non focal  Skin: Warm, no lesions or rashes  cxr 11/08/12      Changes of COPD and old granulomatous disease.  Chronic bibasilar atelectasis versus scarring and biapical scarring  stable.  No acute abnormalities    Assessment & Plan:

## 2012-11-08 NOTE — Progress Notes (Signed)
Quick Note:  Spoke with pt and notified of results per Dr. Wert. Pt verbalized understanding and denied any questions.  ______ 

## 2012-11-09 DIAGNOSIS — J9610 Chronic respiratory failure, unspecified whether with hypoxia or hypercapnia: Secondary | ICD-10-CM | POA: Insufficient documentation

## 2012-11-09 NOTE — Assessment & Plan Note (Signed)
On 02 2lpm qhs and daytime prn  Adequate control on present rx, reviewed

## 2012-11-09 NOTE — Assessment & Plan Note (Signed)
-   PFTs 09/24/07 with mild obst but f/v contour mostly abn in the effort dep portion  DDX of  difficult airways managment all start with A and  include Adherence, Ace Inhibitors, Acid Reflux, Active Sinus Disease, Alpha 1 Antitripsin deficiency, Anxiety masquerading as Airways dz,  ABPA,  allergy(esp in young), Aspiration (esp in elderly), Adverse effects of DPI,  Active smokers, plus two Bs  = Bronchiectasis and Beta blocker use..and one C= CHF   Adherence is always the initial "prime suspect" and is a multilayered concern that requires a "trust but verify" approach in every patient - starting with knowing how to use medications, especially inhalers, correctly, keeping up with refills and understanding the fundamental difference between maintenance and prns vs those medications only taken for a very short course and then stopped and not refilled. She is struggling with concept of med reconciliation and suffering from severe polypharmacy.   To keep things simple, I have asked the patient to first separate medicines that are perceived as maintenance, that is to be taken daily "no matter what", from those medicines that are taken on only on an as-needed basis and I have given the patient examples of both, and then return to see our NP to generate a  detailed  medication calendar which should be followed until the next physician sees the patient and updates it.     ? Acid reflux > continue max acid suppression plus diet  ? Allergy > defer rx to Dr Lucie Leather  ? Anxiety usually a dx of exclusion but note xanax is the one drug out of her bucket of drugs that actually helps but "doesn't want to take it" so change it from prn to xanax 0.25 mg qid until return and consider titrating up.

## 2012-11-12 ENCOUNTER — Inpatient Hospital Stay (HOSPITAL_BASED_OUTPATIENT_CLINIC_OR_DEPARTMENT_OTHER)
Admission: EM | Admit: 2012-11-12 | Discharge: 2012-11-16 | DRG: 291 | Disposition: A | Discharge status: Home Health | Payer: Medicare Other | Attending: Internal Medicine | Admitting: Internal Medicine

## 2012-11-12 ENCOUNTER — Encounter (HOSPITAL_BASED_OUTPATIENT_CLINIC_OR_DEPARTMENT_OTHER): Payer: Self-pay | Admitting: *Deleted

## 2012-11-12 DIAGNOSIS — J45901 Unspecified asthma with (acute) exacerbation: Secondary | ICD-10-CM | POA: Diagnosis present

## 2012-11-12 DIAGNOSIS — Z79899 Other long term (current) drug therapy: Secondary | ICD-10-CM

## 2012-11-12 DIAGNOSIS — Z66 Do not resuscitate: Secondary | ICD-10-CM | POA: Diagnosis not present

## 2012-11-12 DIAGNOSIS — K439 Ventral hernia without obstruction or gangrene: Secondary | ICD-10-CM | POA: Diagnosis present

## 2012-11-12 DIAGNOSIS — J449 Chronic obstructive pulmonary disease, unspecified: Secondary | ICD-10-CM

## 2012-11-12 DIAGNOSIS — J45909 Unspecified asthma, uncomplicated: Secondary | ICD-10-CM

## 2012-11-12 DIAGNOSIS — Z8679 Personal history of other diseases of the circulatory system: Secondary | ICD-10-CM

## 2012-11-12 DIAGNOSIS — J189 Pneumonia, unspecified organism: Secondary | ICD-10-CM | POA: Diagnosis present

## 2012-11-12 DIAGNOSIS — J383 Other diseases of vocal cords: Secondary | ICD-10-CM | POA: Diagnosis present

## 2012-11-12 DIAGNOSIS — Z87891 Personal history of nicotine dependence: Secondary | ICD-10-CM

## 2012-11-12 DIAGNOSIS — Z933 Colostomy status: Secondary | ICD-10-CM

## 2012-11-12 DIAGNOSIS — G8929 Other chronic pain: Secondary | ICD-10-CM

## 2012-11-12 DIAGNOSIS — J9600 Acute respiratory failure, unspecified whether with hypoxia or hypercapnia: Secondary | ICD-10-CM

## 2012-11-12 DIAGNOSIS — F0390 Unspecified dementia without behavioral disturbance: Secondary | ICD-10-CM | POA: Diagnosis present

## 2012-11-12 DIAGNOSIS — IMO0002 Reserved for concepts with insufficient information to code with codable children: Secondary | ICD-10-CM

## 2012-11-12 DIAGNOSIS — E785 Hyperlipidemia, unspecified: Secondary | ICD-10-CM | POA: Diagnosis present

## 2012-11-12 DIAGNOSIS — Z7901 Long term (current) use of anticoagulants: Secondary | ICD-10-CM

## 2012-11-12 DIAGNOSIS — J9691 Respiratory failure, unspecified with hypoxia: Secondary | ICD-10-CM

## 2012-11-12 DIAGNOSIS — K219 Gastro-esophageal reflux disease without esophagitis: Secondary | ICD-10-CM | POA: Diagnosis present

## 2012-11-12 DIAGNOSIS — D518 Other vitamin B12 deficiency anemias: Secondary | ICD-10-CM

## 2012-11-12 DIAGNOSIS — G4700 Insomnia, unspecified: Secondary | ICD-10-CM

## 2012-11-12 DIAGNOSIS — F411 Generalized anxiety disorder: Secondary | ICD-10-CM | POA: Diagnosis present

## 2012-11-12 DIAGNOSIS — I10 Essential (primary) hypertension: Secondary | ICD-10-CM | POA: Diagnosis present

## 2012-11-12 DIAGNOSIS — M1990 Unspecified osteoarthritis, unspecified site: Secondary | ICD-10-CM | POA: Diagnosis present

## 2012-11-12 DIAGNOSIS — S68118A Complete traumatic metacarpophalangeal amputation of other finger, initial encounter: Secondary | ICD-10-CM

## 2012-11-12 DIAGNOSIS — I498 Other specified cardiac arrhythmias: Secondary | ICD-10-CM

## 2012-11-12 DIAGNOSIS — C50919 Malignant neoplasm of unspecified site of unspecified female breast: Secondary | ICD-10-CM

## 2012-11-12 DIAGNOSIS — J9610 Chronic respiratory failure, unspecified whether with hypoxia or hypercapnia: Secondary | ICD-10-CM

## 2012-11-12 DIAGNOSIS — C801 Malignant (primary) neoplasm, unspecified: Secondary | ICD-10-CM

## 2012-11-12 DIAGNOSIS — Z853 Personal history of malignant neoplasm of breast: Secondary | ICD-10-CM

## 2012-11-12 DIAGNOSIS — M5430 Sciatica, unspecified side: Secondary | ICD-10-CM | POA: Diagnosis present

## 2012-11-12 DIAGNOSIS — I509 Heart failure, unspecified: Secondary | ICD-10-CM | POA: Diagnosis present

## 2012-11-12 DIAGNOSIS — R195 Other fecal abnormalities: Secondary | ICD-10-CM

## 2012-11-12 DIAGNOSIS — E538 Deficiency of other specified B group vitamins: Secondary | ICD-10-CM | POA: Diagnosis present

## 2012-11-12 DIAGNOSIS — Z950 Presence of cardiac pacemaker: Secondary | ICD-10-CM

## 2012-11-12 DIAGNOSIS — K659 Peritonitis, unspecified: Secondary | ICD-10-CM

## 2012-11-12 DIAGNOSIS — R1032 Left lower quadrant pain: Secondary | ICD-10-CM

## 2012-11-12 DIAGNOSIS — J95821 Acute postprocedural respiratory failure: Secondary | ICD-10-CM

## 2012-11-12 DIAGNOSIS — J309 Allergic rhinitis, unspecified: Secondary | ICD-10-CM

## 2012-11-12 DIAGNOSIS — J4551 Severe persistent asthma with (acute) exacerbation: Secondary | ICD-10-CM

## 2012-11-12 DIAGNOSIS — I4891 Unspecified atrial fibrillation: Secondary | ICD-10-CM | POA: Diagnosis present

## 2012-11-12 DIAGNOSIS — I5021 Acute systolic (congestive) heart failure: Principal | ICD-10-CM

## 2012-11-12 DIAGNOSIS — E039 Hypothyroidism, unspecified: Secondary | ICD-10-CM | POA: Diagnosis present

## 2012-11-12 DIAGNOSIS — K5780 Diverticulitis of intestine, part unspecified, with perforation and abscess without bleeding: Secondary | ICD-10-CM

## 2012-11-12 DIAGNOSIS — I5031 Acute diastolic (congestive) heart failure: Secondary | ICD-10-CM

## 2012-11-12 DIAGNOSIS — D500 Iron deficiency anemia secondary to blood loss (chronic): Secondary | ICD-10-CM

## 2012-11-12 DIAGNOSIS — Z9981 Dependence on supplemental oxygen: Secondary | ICD-10-CM

## 2012-11-12 DIAGNOSIS — R0902 Hypoxemia: Secondary | ICD-10-CM | POA: Diagnosis present

## 2012-11-12 DIAGNOSIS — Z8619 Personal history of other infectious and parasitic diseases: Secondary | ICD-10-CM

## 2012-11-12 LAB — CBC WITH DIFFERENTIAL/PLATELET
HCT: 42.4 % (ref 36.0–46.0)
Hemoglobin: 13.4 g/dL (ref 12.0–15.0)
Lymphs Abs: 0.3 10*3/uL — ABNORMAL LOW (ref 0.7–4.0)
MCHC: 31.6 g/dL (ref 30.0–36.0)
Monocytes Absolute: 0.6 10*3/uL (ref 0.1–1.0)
Monocytes Relative: 3 % (ref 3–12)
Neutro Abs: 15.9 10*3/uL — ABNORMAL HIGH (ref 1.7–7.7)
Neutrophils Relative %: 95 % — ABNORMAL HIGH (ref 43–77)
RBC: 4.7 MIL/uL (ref 3.87–5.11)

## 2012-11-12 LAB — BASIC METABOLIC PANEL
BUN: 29 mg/dL — ABNORMAL HIGH (ref 6–23)
CO2: 28 mEq/L (ref 19–32)
Chloride: 99 mEq/L (ref 96–112)
Creatinine, Ser: 1.1 mg/dL (ref 0.50–1.10)
Glucose, Bld: 131 mg/dL — ABNORMAL HIGH (ref 70–99)
Potassium: 4 mEq/L (ref 3.5–5.1)

## 2012-11-12 LAB — PRO B NATRIURETIC PEPTIDE: Pro B Natriuretic peptide (BNP): 1782 pg/mL — ABNORMAL HIGH (ref 0–450)

## 2012-11-12 MED ORDER — ALBUTEROL (5 MG/ML) CONTINUOUS INHALATION SOLN
10.0000 mg/h | INHALATION_SOLUTION | Freq: Once | RESPIRATORY_TRACT | Status: AC
  Administered 2012-11-13: 10 mg/h via RESPIRATORY_TRACT
  Filled 2012-11-12: qty 20
  Filled 2012-11-12: qty 0.5

## 2012-11-12 MED ORDER — IPRATROPIUM BROMIDE 0.02 % IN SOLN
0.5000 mg | Freq: Once | RESPIRATORY_TRACT | Status: AC
  Administered 2012-11-13: 0.5 mg via RESPIRATORY_TRACT
  Filled 2012-11-12: qty 2.5

## 2012-11-12 NOTE — ED Notes (Signed)
MD at bedside. 

## 2012-11-12 NOTE — ED Provider Notes (Addendum)
History    This chart was scribed for Derwood Kaplan, MD by Donne Anon, ED Scribe. This patient was seen in room MH06/MH06 and the patient's care was started at 2303.   CSN: [id number]  Arrival date & time 11/12/12  2224   First MD Initiated Contact with Patient 11/12/12 2303      Chief Complaint  Patient presents with  . Shortness of Breath     The history is provided by the patient and the spouse. No language interpreter was used.   HPI Comments: [name] is a 79 y.o. female with hx of asthma, who presents to the Emergency Department complaining of 8 hours of gradual onset, gradually worsening, recurrent SOB which began when she woke up from a nap. She reports she has been having an increasing mount of laryngeal spasms over the past 3-4 weeks. The spasms last for 20 minutes, and they are getting increasingly more frequent and severe. She reports associated intermittent productive cough with clear sputum for the past 2 weeks. She reports she coughed up 1/2 cup of sputum this evening. She states laying down makes the SOB and the cough worse. She states 2 weeks ago she began on home oxygen and usually takes 2 liters but began taking 3 liters over the past week. She reports she only occasionally needed oxygen at night before 3 weeks ago. She has tried Prednisone (on daily 20 mg chronically), Pepcid, Amoxicillin (on it at this time), Xanax, Lasix and a nebulizer with little relief. She denies drooling, difficulty swallowing, fever, chills, leg swelling or any other pain.  She has seen a Pulmonologist 2x this week who prescribed her oxycodone for her cough. She currently has a pacemaker. She denies a h/o blood clots. She has a CXR 4 days ago at [facility] which didn't show any pneumonia.  Her PCP Dr. Meliton Rattan in [place].   Past Medical History  Diagnosis Date  . Insomnia, unspecified   . Osteoarthrosis, unspecified whether generalized or localized, unspecified site   . Unspecified  hypothyroidism   . Unspecified essential hypertension   . Atrial fibrillation   . Sciatica   . Anemia   . COPD (chronic obstructive pulmonary disease)   . UTI (urinary tract infection)   . GERD (gastroesophageal reflux disease)   . Pneumonia   . Asthma   . Clostridium difficile colitis   . Diverticulitis of sigmoid colon 11/2010    with abscess/contained perforation  . Dementia     mild  . Dehydration with hyponatremia 12/2010    hospitalized  . Angiodysplasia of stomach     ablated 2010  . Angiodysplasia of colon     ablated 2010  . Vitamin B12 deficiency (dietary) anemia 04/12/2011  . Shingles   . Sarcoma     right hand  . Malignant neoplasm of breast (female), unspecified site     left  . Dupuytren's disease     left hand  . Vitamin B12 deficiency 04/02/2012  . Complication of anesthesia     " very cautious because of my COPD"  . Pacemaker     Past Surgical History  Procedure Laterality Date  . Nasal sinus surgery      x 3  . Appendectomy    . Hernia repair    . Abdominal hysterectomy    . Pacemaker insertion    . Eye surgery      x6-bilateral  . Finger amputation      right  . Foot surgery  right toes x 3  . Tonsillectomy    . Colonoscopy  01/2009    w/ablation of 3 mm AVM, diverticulosis, external hemorrhoids  . Upper gastrointestinal endoscopy  01/2009    w/polypectomy, AVM, hyperplastic polyp  . Abdominal hysterectomy    . Laparotomy  11/12/2011    Procedure: EXPLORATORY LAPAROTOMY;  Surgeon: Lodema Pilot, DO;  Location: [facility];  Service: General;  Laterality: N/A;  . Colostomy revision  11/12/2011    Procedure: COLON RESECTION SIGMOID;  Surgeon: Lodema Pilot, DO;  Location: [facility];  Service: General;  Laterality: N/A;  . Colostomy  11/12/2011    Procedure: COLOSTOMY;  Surgeon: Lodema Pilot, DO;  Location: [facility];  Service: General;  Laterality: Left;  End colostomy  . Laparotomy  11/19/2011    Procedure: EXPLORATORY LAPAROTOMY;  Surgeon: Kandis Cocking, MD;  Location: [facility];  Service: General;  Laterality: N/A;  . Gastrostomy  11/19/2011    Procedure: GASTROSTOMY;  Surgeon: Kandis Cocking, MD;  Location: [facility];  Service: General;;  INSERTION GASTROSTOMY TUBE  . Application of wound vac  11/19/2011    Procedure: APPLICATION OF WOUND VAC;  Surgeon: Kandis Cocking, MD;  Location: [facility];  Service: General;  Laterality: N/A;    Family History  Problem Relation Age of Onset  . Emphysema Paternal Uncle   . Heart disease Mother   . Colon cancer Mother   . Pancreatic cancer Father   . Cancer Father 80    pancreatic cancer  . Tuberculosis Paternal Uncle   . Breast cancer Maternal Aunt   . Breast cancer Cousin   . Prostate cancer Cousin   . Colon cancer Cousin   . Uterine cancer Cousin     History  Substance Use Topics  . Smoking status: Former Smoker -- 0.50 packs/day for 21 years    Types: Cigarettes    Quit date: 06/20/1977  . Smokeless tobacco: Never Used  . Alcohol Use: Yes     Comment: 1 glass wine/night    OB History   Grav Para Term Preterm Abortions TAB SAB Ect Mult Living                  Review of Systems  Constitutional: Negative for fever and chills.  HENT: Negative for trouble swallowing.   Respiratory: Positive for cough and shortness of breath.   Cardiovascular: Negative for leg swelling.  All other systems reviewed and are negative.    Allergies  Aspirin; Sulfonamide derivatives; Ibuprofen; Iron dextran; and Metoclopramide hcl  Home Medications   Current Outpatient Rx  Name  Route  Sig  Dispense  Refill  . predniSONE (DELTASONE) 10 MG tablet   Oral   Take 40 mg by mouth daily.         [name] albuterol (PROVENTIL HFA;VENTOLIN HFA) 108 (90 BASE) MCG/ACT inhaler   Inhalation   Inhale 2 puffs into the lungs 2 (two) times daily.         [name] albuterol (PROVENTIL) (2.5 MG/3ML) 0.083% nebulizer solution   Nebulization   Take 2.5 mg by nebulization every 6 (six) hours as needed. For shortness of  breath         . ALPRAZolam (XANAX) 0.25 MG tablet   Oral   Take 0.25 mg by mouth at bedtime as needed. For sleep         . amoxicillin-clavulanate (AUGMENTIN) 875-125 MG per tablet   Oral   Take 2 tablets by mouth 2 (two) times daily.         [name]  beclomethasone (QVAR) 80 MCG/ACT inhaler   Inhalation   Inhale 3 puffs into the lungs 3 (three) times daily.          . Budesonide (RHINOCORT AQUA NA)   Nasal   Place 1 spray into the nose daily.          [name] dexlansoprazole (DEXILANT) 60 MG capsule   Oral   Take 60 mg by mouth daily before breakfast.         . DULoxetine (CYMBALTA) 30 MG capsule   Oral   Take 30 mg by mouth daily.         . furosemide (LASIX) 20 MG tablet   Oral   Take 20 mg by mouth daily as needed. For fluid retention.         [name] HYDROcodone-acetaminophen (NORCO) 10-325 MG per tablet   Oral   Take 1 tablet by mouth every 6 (six) hours as needed for pain.         . hydroxypropyl methylcellulose (ISOPTO TEARS) 2.5 % ophthalmic solution   Both Eyes   Place 2 drops into both eyes 2 (two) times daily.         [name] levothyroxine (SYNTHROID, LEVOTHROID) 50 MCG tablet   Oral   Take 50 mcg by mouth daily before breakfast.          . losartan (COZAAR) 50 MG tablet   Oral   Take 50 mg by mouth daily.         . metoprolol succinate (TOPROL-XL) 50 MG 24 hr tablet      TAKE 1 TABLET BY MOUTH EVERY DAY   90 tablet   3   . mirtazapine (REMERON) 30 MG tablet   Oral   Take 15 mg by mouth at bedtime.          . Multiple Vitamin (MULTIVITAMIN WITH MINERALS) TABS   Oral   Take 1 tablet by mouth daily.         [name] oxyCODONE (OXY IR/ROXICODONE) 5 MG immediate release tablet   Oral   Take 1-2 tablets (5-10 mg total) by mouth every 4 (four) hours as needed for pain.   40 tablet   0   . polyethylene glycol (MIRALAX / GLYCOLAX) packet   Oral   Take 17 g by mouth daily.         . pravastatin (PRAVACHOL) 40 MG tablet   Oral   Take 40 mg by  mouth daily.         . predniSONE (DELTASONE) 5 MG tablet   Oral   Take 20 mg by mouth daily.          . promethazine (PHENERGAN) 25 MG tablet   Oral   Take 25 mg by mouth every 6 (six) hours as needed. For nausea         . Respiratory Therapy Supplies (FLUTTER) DEVI      Use as directed   1 each   0   . senna-docusate (SENOKOT-S) 8.6-50 MG per tablet   Oral   Take 1 tablet by mouth daily.         . Vitamin D, Ergocalciferol, (DRISDOL) 50000 UNITS CAPS   Oral   Take 50,000 Units by mouth every 7 (seven) days. On Sundays         . zafirlukast (ACCOLATE) 20 MG tablet   Oral   Take 20 mg by mouth 2 (two) times daily.         [name] zolpidem (  AMBIEN) 10 MG tablet   Oral   Take 10 mg by mouth at bedtime.            BP 135/89  Pulse 102  Temp(Src) 99.6 F (37.6 C) (Oral)  Resp 22  SpO2 91%  Physical Exam  Nursing note and vitals reviewed. Constitutional: She is oriented to person, place, and time. She appears well-developed and well-nourished. No distress.  HENT:  Head: Normocephalic and atraumatic.  Eyes: EOM are normal.  Neck: Neck supple. No tracheal deviation present.  Cardiovascular: Normal rate and regular rhythm.   Pulmonary/Chest: Stridor present. She is in respiratory distress.  Diffuse rhonchus breath sounds with increased expiratory time.   Musculoskeletal: Normal range of motion.  No edema or tenderness in lower extremity.  Neurological: She is alert and oriented to person, place, and time.  Skin: Skin is warm and dry.  Psychiatric: She has a normal mood and affect. Her behavior is normal.    ED Course  Procedures (including critical care time) DIAGNOSTIC STUDIES: Oxygen Saturation is 91% on N/C, low by my interpretation.    COORDINATION OF CARE: 11:14 PM Discussed treatment plan which includes CXR and a breathing treatment with pt at bedside and pt agreed to plan.     Labs Reviewed - No data to display No results found.   No  diagnosis found.    MDM  I personally performed the services described in this documentation, which was scribed in my presence. The recorded information has been reviewed and is accurate.   Date: 11/13/2012  Rate: 99  Rhythm: atrial paced  QRS Axis: LEFT  Intervals: normal  ST/T Wave abnormalities: nonspecific ST/T changes  Conduction Disutrbances:none  Narrative Interpretation:   Old EKG Reviewed: unchanged  Pt with hx of asthma, HTN, afib, pacemanker, may be a mild CHF comes in with cc of dib.  Pt has been having increased secretions with coughing over the past few weeks now. She has seen a pulmonologist twice, tried on levaquin and currently on amoxi with no improvement. She reports worsening DIB, and Bronchospasm spells that are severe. She has been taking all her meds as prescribed. She is now on continuous 2 L O2!  No clinical concerns for PE - only because she clearly has heavy secretions. Exam not suggestive of fluid overload - she does have PND like sx.  Her Lung exam is very poor, and she is in resp distress with inability to complete full sentences. Mild stridor appreciated as well, protecting airway without any difficulty.  Will initiate cardiopulm workup.   Derwood Kaplan, MD 11/13/12 0116  1:17 AM Pt has leukoctosis. Suspect PNA based on exam and the clinical picture. Hypoxic at room air in the mid 80s O2 sat. On 2 L goes up to low 90s. CURB 65 score is 2, PORT score is 106 -indicating CAP admission should be beneficial. Will give iv levo and admit.   Derwood Kaplan, MD 11/13/12 0119  2:16 AM Repeat eval - pt feeling better. Still hypoxic on room air. Breathing tx seems to have helped. She still feels that she has some upper airway obstruction intermittently. No sore throat, no deep infection suspected.  Pt was intubated last admission - possible vocal cord injury, new cyst, or some other cause for stenosing of the upper airway. Will continue to monitor  closely for now.  CRITICAL CARE Performed by: Derwood Kaplan   Total critical care time: 35 minutes  Critical care time was exclusive of separately billable procedures  and treating other patients.  Critical care was necessary to treat or prevent imminent or life-threatening deterioration.  Critical care was time spent personally by me on the following activities: development of treatment plan with patient and/or surrogate as well as nursing, discussions with consultants, evaluation of patient's response to treatment, examination of patient, obtaining history from patient or surrogate, ordering and performing treatments and interventions, ordering and review of laboratory studies, ordering and review of radiographic studies, pulse oximetry and re-evaluation of patient's condition.   Derwood Kaplan, MD 11/13/12 [phone]

## 2012-11-12 NOTE — ED Notes (Signed)
Sob x 8 hours. She is on home oxygen at 2-3 liters. Has been taking Prednisone 20mg  /day for the past 5 days but yesterday her dose was increased to 40mg /day.

## 2012-11-13 ENCOUNTER — Emergency Department (HOSPITAL_BASED_OUTPATIENT_CLINIC_OR_DEPARTMENT_OTHER): Payer: Medicare Other

## 2012-11-13 ENCOUNTER — Encounter (HOSPITAL_COMMUNITY): Payer: Self-pay | Admitting: Internal Medicine

## 2012-11-13 ENCOUNTER — Inpatient Hospital Stay (HOSPITAL_COMMUNITY): Payer: Medicare Other

## 2012-11-13 DIAGNOSIS — R0902 Hypoxemia: Secondary | ICD-10-CM

## 2012-11-13 DIAGNOSIS — I509 Heart failure, unspecified: Secondary | ICD-10-CM

## 2012-11-13 DIAGNOSIS — J449 Chronic obstructive pulmonary disease, unspecified: Secondary | ICD-10-CM

## 2012-11-13 DIAGNOSIS — I4891 Unspecified atrial fibrillation: Secondary | ICD-10-CM

## 2012-11-13 DIAGNOSIS — J45909 Unspecified asthma, uncomplicated: Secondary | ICD-10-CM

## 2012-11-13 DIAGNOSIS — I5031 Acute diastolic (congestive) heart failure: Secondary | ICD-10-CM | POA: Diagnosis present

## 2012-11-13 DIAGNOSIS — J45901 Unspecified asthma with (acute) exacerbation: Secondary | ICD-10-CM | POA: Diagnosis present

## 2012-11-13 DIAGNOSIS — J383 Other diseases of vocal cords: Secondary | ICD-10-CM | POA: Diagnosis present

## 2012-11-13 DIAGNOSIS — J9600 Acute respiratory failure, unspecified whether with hypoxia or hypercapnia: Secondary | ICD-10-CM

## 2012-11-13 DIAGNOSIS — J189 Pneumonia, unspecified organism: Secondary | ICD-10-CM

## 2012-11-13 LAB — BLOOD GAS, ARTERIAL
Acid-Base Excess: 5.2 mmol/L — ABNORMAL HIGH (ref 0.0–2.0)
Drawn by: 24485
O2 Content: 5 L/min
O2 Saturation: 93 %
Patient temperature: 98.6
TCO2: 30.3 mmol/L (ref 0–100)

## 2012-11-13 LAB — CBC
MCV: 88.7 fL (ref 78.0–100.0)
Platelets: 216 10*3/uL (ref 150–400)
RBC: 4.33 MIL/uL (ref 3.87–5.11)
RDW: 15.1 % (ref 11.5–15.5)
WBC: 16.2 10*3/uL — ABNORMAL HIGH (ref 4.0–10.5)

## 2012-11-13 LAB — URINALYSIS, ROUTINE W REFLEX MICROSCOPIC
Bilirubin Urine: NEGATIVE
Glucose, UA: NEGATIVE mg/dL
Hgb urine dipstick: NEGATIVE
Specific Gravity, Urine: 1.016 (ref 1.005–1.030)
Urobilinogen, UA: 0.2 mg/dL (ref 0.0–1.0)
pH: 5 (ref 5.0–8.0)

## 2012-11-13 LAB — CREATININE, SERUM
GFR calc Af Amer: 62 mL/min — ABNORMAL LOW (ref >90)
GFR calc non Af Amer: 53 mL/min — ABNORMAL LOW (ref >90)

## 2012-11-13 LAB — MRSA PCR SCREENING: MRSA by PCR: NEGATIVE

## 2012-11-13 MED ORDER — ALBUTEROL SULFATE (5 MG/ML) 0.5% IN NEBU
2.5000 mg | INHALATION_SOLUTION | RESPIRATORY_TRACT | Status: DC | PRN
  Administered 2012-11-13 – 2012-11-15 (×2): 2.5 mg via RESPIRATORY_TRACT
  Filled 2012-11-13 (×3): qty 0.5

## 2012-11-13 MED ORDER — ZAFIRLUKAST 20 MG PO TABS
20.0000 mg | ORAL_TABLET | Freq: Two times a day (BID) | ORAL | Status: DC
  Administered 2012-11-13 – 2012-11-16 (×6): 20 mg via ORAL
  Filled 2012-11-13 (×9): qty 1

## 2012-11-13 MED ORDER — POLYETHYLENE GLYCOL 3350 17 G PO PACK
17.0000 g | PACK | Freq: Every day | ORAL | Status: DC
  Administered 2012-11-13 – 2012-11-16 (×4): 17 g via ORAL
  Filled 2012-11-13 (×4): qty 1

## 2012-11-13 MED ORDER — ALBUTEROL SULFATE (5 MG/ML) 0.5% IN NEBU
2.5000 mg | INHALATION_SOLUTION | RESPIRATORY_TRACT | Status: DC | PRN
  Administered 2012-11-13: 2.5 mg via RESPIRATORY_TRACT
  Filled 2012-11-13: qty 0.5

## 2012-11-13 MED ORDER — METOPROLOL SUCCINATE ER 50 MG PO TB24
50.0000 mg | ORAL_TABLET | Freq: Every day | ORAL | Status: DC
  Filled 2012-11-13: qty 1

## 2012-11-13 MED ORDER — SODIUM CHLORIDE 0.9 % IJ SOLN
3.0000 mL | Freq: Two times a day (BID) | INTRAMUSCULAR | Status: DC
  Administered 2012-11-13: 3 mL via INTRAVENOUS

## 2012-11-13 MED ORDER — VITAMIN D (ERGOCALCIFEROL) 1.25 MG (50000 UNIT) PO CAPS: 50000.0000 [IU] | ORAL_CAPSULE | ORAL | Status: DC

## 2012-11-13 MED ORDER — LEVOTHYROXINE SODIUM 50 MCG PO TABS
50.0000 ug | ORAL_TABLET | Freq: Every day | ORAL | Status: DC
  Administered 2012-11-13 – 2012-11-16 (×4): 50 ug via ORAL
  Filled 2012-11-13 (×5): qty 1

## 2012-11-13 MED ORDER — ACETAMINOPHEN 325 MG PO TABS: 650.0000 mg | ORAL_TABLET | Freq: Four times a day (QID) | ORAL | Status: DC | PRN

## 2012-11-13 MED ORDER — SODIUM CHLORIDE 0.9 % IV SOLN
Freq: Once | INTRAVENOUS | Status: AC
  Administered 2012-11-13: 01:00:00 via INTRAVENOUS

## 2012-11-13 MED ORDER — ONDANSETRON HCL 4 MG PO TABS: 4.0000 mg | ORAL_TABLET | Freq: Four times a day (QID) | ORAL | Status: DC | PRN

## 2012-11-13 MED ORDER — BUDESONIDE 0.25 MG/2ML IN SUSP
0.2500 mg | Freq: Two times a day (BID) | RESPIRATORY_TRACT | Status: DC
  Administered 2012-11-13 – 2012-11-16 (×7): 0.25 mg via RESPIRATORY_TRACT
  Filled 2012-11-13 (×10): qty 2

## 2012-11-13 MED ORDER — ALPRAZOLAM 0.25 MG PO TABS: 0.2500 mg | ORAL_TABLET | Freq: Every evening | ORAL | Status: DC | PRN

## 2012-11-13 MED ORDER — ACETAMINOPHEN 650 MG RE SUPP: 650.0000 mg | Freq: Four times a day (QID) | RECTAL | Status: DC | PRN

## 2012-11-13 MED ORDER — FUROSEMIDE 20 MG PO TABS
20.0000 mg | ORAL_TABLET | Freq: Every day | ORAL | Status: DC
  Administered 2012-11-13 – 2012-11-14 (×2): 20 mg via ORAL
  Filled 2012-11-13 (×2): qty 1

## 2012-11-13 MED ORDER — SIMVASTATIN 20 MG PO TABS
20.0000 mg | ORAL_TABLET | Freq: Every day | ORAL | Status: DC
  Administered 2012-11-13 – 2012-11-15 (×3): 20 mg via ORAL
  Filled 2012-11-13 (×4): qty 1

## 2012-11-13 MED ORDER — ALPRAZOLAM 0.25 MG PO TABS
0.2500 mg | ORAL_TABLET | Freq: Four times a day (QID) | ORAL | Status: DC | PRN
  Administered 2012-11-13 – 2012-11-14 (×3): 0.25 mg via ORAL
  Filled 2012-11-13 (×3): qty 1

## 2012-11-13 MED ORDER — LEVOFLOXACIN IN D5W 750 MG/150ML IV SOLN
750.0000 mg | INTRAVENOUS | Status: DC
  Administered 2012-11-14: 750 mg via INTRAVENOUS
  Filled 2012-11-13: qty 150

## 2012-11-13 MED ORDER — HYPROMELLOSE (GONIOSCOPIC) 2.5 % OP SOLN
2.0000 [drp] | Freq: Two times a day (BID) | OPHTHALMIC | Status: DC
  Administered 2012-11-13: 2 [drp] via OPHTHALMIC
  Filled 2012-11-13: qty 15

## 2012-11-13 MED ORDER — MIRTAZAPINE 15 MG PO TABS
15.0000 mg | ORAL_TABLET | Freq: Every day | ORAL | Status: DC
  Administered 2012-11-13 – 2012-11-15 (×3): 15 mg via ORAL
  Filled 2012-11-13 (×5): qty 1

## 2012-11-13 MED ORDER — ALBUTEROL SULFATE (5 MG/ML) 0.5% IN NEBU
2.5000 mg | INHALATION_SOLUTION | Freq: Four times a day (QID) | RESPIRATORY_TRACT | Status: DC
  Administered 2012-11-13 – 2012-11-14 (×4): 2.5 mg via RESPIRATORY_TRACT
  Filled 2012-11-13 (×5): qty 0.5

## 2012-11-13 MED ORDER — ENOXAPARIN SODIUM 40 MG/0.4ML ~~LOC~~ SOLN
40.0000 mg | SUBCUTANEOUS | Status: DC
  Administered 2012-11-13 – 2012-11-16 (×4): 40 mg via SUBCUTANEOUS
  Filled 2012-11-13 (×4): qty 0.4

## 2012-11-13 MED ORDER — ADULT MULTIVITAMIN W/MINERALS CH
1.0000 | ORAL_TABLET | Freq: Every day | ORAL | Status: DC
  Administered 2012-11-13 – 2012-11-16 (×4): 1 via ORAL
  Filled 2012-11-13 (×4): qty 1

## 2012-11-13 MED ORDER — LEVOFLOXACIN IN D5W 500 MG/100ML IV SOLN
500.0000 mg | Freq: Once | INTRAVENOUS | Status: AC
  Administered 2012-11-13: 500 mg via INTRAVENOUS
  Filled 2012-11-13: qty 100

## 2012-11-13 MED ORDER — FUROSEMIDE 10 MG/ML IJ SOLN
INTRAMUSCULAR | Status: AC
  Filled 2012-11-13: qty 4

## 2012-11-13 MED ORDER — PROMETHAZINE HCL 25 MG PO TABS
25.0000 mg | ORAL_TABLET | Freq: Four times a day (QID) | ORAL | Status: DC | PRN
Start: 2012-11-13 — End: 2012-11-16

## 2012-11-13 MED ORDER — ONDANSETRON HCL 4 MG/2ML IJ SOLN: 4.0000 mg | Freq: Four times a day (QID) | INTRAMUSCULAR | Status: DC | PRN

## 2012-11-13 MED ORDER — METHYLPREDNISOLONE SODIUM SUCC 40 MG IJ SOLR
40.0000 mg | Freq: Every day | INTRAMUSCULAR | Status: DC
  Administered 2012-11-13 – 2012-11-14 (×2): 40 mg via INTRAVENOUS
  Filled 2012-11-13 (×2): qty 1

## 2012-11-13 MED ORDER — SODIUM CHLORIDE 0.9 % IJ SOLN
3.0000 mL | Freq: Two times a day (BID) | INTRAMUSCULAR | Status: DC
  Administered 2012-11-13 – 2012-11-16 (×6): 3 mL via INTRAVENOUS

## 2012-11-13 MED ORDER — SENNOSIDES-DOCUSATE SODIUM 8.6-50 MG PO TABS
1.0000 | ORAL_TABLET | Freq: Every day | ORAL | Status: DC
  Administered 2012-11-13: 1 via ORAL
  Filled 2012-11-13 (×2): qty 1

## 2012-11-13 MED ORDER — DULOXETINE HCL 30 MG PO CPEP
30.0000 mg | ORAL_CAPSULE | Freq: Every day | ORAL | Status: DC
  Administered 2012-11-13 – 2012-11-16 (×4): 30 mg via ORAL
  Filled 2012-11-13 (×4): qty 1

## 2012-11-13 MED ORDER — NEBIVOLOL HCL 10 MG PO TABS
10.0000 mg | ORAL_TABLET | Freq: Every day | ORAL | Status: DC
  Administered 2012-11-14 – 2012-11-16 (×3): 10 mg via ORAL
  Filled 2012-11-13 (×3): qty 1

## 2012-11-13 MED ORDER — PANTOPRAZOLE SODIUM 40 MG PO TBEC
40.0000 mg | DELAYED_RELEASE_TABLET | Freq: Every day | ORAL | Status: DC
  Administered 2012-11-13 – 2012-11-16 (×4): 40 mg via ORAL
  Filled 2012-11-13 (×5): qty 1

## 2012-11-13 MED ORDER — POLYVINYL ALCOHOL 1.4 % OP SOLN
2.0000 [drp] | Freq: Two times a day (BID) | OPHTHALMIC | Status: DC
  Administered 2012-11-13 – 2012-11-16 (×6): 2 [drp] via OPHTHALMIC

## 2012-11-13 MED ORDER — OXYCODONE HCL 5 MG PO TABS
5.0000 mg | ORAL_TABLET | ORAL | Status: DC | PRN
  Administered 2012-11-13 – 2012-11-15 (×4): 5 mg via ORAL
  Filled 2012-11-13: qty 1
  Filled 2012-11-13: qty 2
  Filled 2012-11-13 (×2): qty 1

## 2012-11-13 MED ORDER — LOSARTAN POTASSIUM 50 MG PO TABS
50.0000 mg | ORAL_TABLET | Freq: Every day | ORAL | Status: DC
  Administered 2012-11-13 – 2012-11-16 (×4): 50 mg via ORAL
  Filled 2012-11-13 (×4): qty 1

## 2012-11-13 MED ORDER — FUROSEMIDE 10 MG/ML IJ SOLN
20.0000 mg | Freq: Once | INTRAMUSCULAR | Status: AC
  Administered 2012-11-13: 20 mg via INTRAVENOUS

## 2012-11-13 MED ORDER — ZOLPIDEM TARTRATE 5 MG PO TABS
5.0000 mg | ORAL_TABLET | Freq: Every day | ORAL | Status: DC
  Administered 2012-11-13 – 2012-11-15 (×3): 5 mg via ORAL
  Filled 2012-11-13 (×3): qty 1

## 2012-11-13 MED ORDER — NEBIVOLOL HCL 5 MG PO TABS
5.0000 mg | ORAL_TABLET | Freq: Every day | ORAL | Status: DC
  Administered 2012-11-13: 5 mg via ORAL
  Filled 2012-11-13: qty 1

## 2012-11-13 NOTE — Progress Notes (Signed)
Patient seen and examined. Admitted after midnight secondary to increased SOB. Thoughts are for CAP and exacerbation of COPD. Found on her chart recent diagnosis of Vocal cord dysfunction as well, treated and follow by Dr. Sherene Sires. Per nurse patient with increased work of breathing and use of accessory muscle. On my evaluation, patient O2 sat in mid 80's and unable to speak in full sentences; after receiving nebulizer treatment and calming patient O2 sats improved and her breathing was less labor.  Plan: -continue tx with nebulizer, abx's, oxygen supplementation and steroids -will aslo exchange non selective b-blockers to bystolic -as per patient request and given work of breathing will get ABG and consult PCCM -xanax adjusted for better control of underlying anxiety and VCD.  Gaye Scorza [phone]

## 2012-11-13 NOTE — Progress Notes (Signed)
Utilization Review Completed.   Hildy Nicholl, RN, BSN Nurse Case Manager  [phone]  

## 2012-11-13 NOTE — ED Notes (Signed)
Report given to [facility], RN [facility] Room [phone]

## 2012-11-13 NOTE — Consult Note (Signed)
PULMONARY  / CRITICAL CARE MEDICINE  Name: [name] MRN: [id number] DOB: [birth year]    ADMISSION DATE:  11/12/2012 CONSULTATION DATE:  11/13/2012  REFERRING MD :  Gwenlyn Perking PRIMARY SERVICE:  [facility]  CHIEF COMPLAINT:  SOB and VCD  BRIEF PATIENT DESCRIPTION: 79 year old with history of asthma, chronic prednisone (7.5 mg/day) and O2 dependent at 3L [place] at home presenting to the hospital with 2 wks history of worsening SOB due to "attacks" that take 20 minutes to resolve.  Sees [facility] for VCD and asthma.  Was started on a prednisone dose increase of 20 mg/day and augmentin x14 days that she is almost completed.  PPI has been started years ago and rhinocort for PND.  Interestingly, SOB started 2 years ago after major abdominal surgery for diverticulitis complicated by wound dehiscence and was told at the time that her diaphragm was no longer functional.  Have tried mucinex in the back with little effect.  SIGNIFICANT EVENTS / STUDIES:  5/26 admission for SOB.  LINES / TUBES: PIV  CULTURES: None  ANTIBIOTICS: Levofloxacin 5/26>>>  PAST MEDICAL HISTORY :  Past Medical History  Diagnosis Date  . Insomnia, unspecified   . Osteoarthrosis, unspecified whether generalized or localized, unspecified site   . Unspecified hypothyroidism   . Unspecified essential hypertension   . Atrial fibrillation   . Sciatica   . Anemia   . COPD (chronic obstructive pulmonary disease)   . UTI (urinary tract infection)   . GERD (gastroesophageal reflux disease)   . Pneumonia   . Asthma   . Clostridium difficile colitis   . Diverticulitis of sigmoid colon 11/2010    with abscess/contained perforation  . Dementia     mild  . Dehydration with hyponatremia 12/2010    hospitalized  . Angiodysplasia of stomach     ablated 2010  . Angiodysplasia of colon     ablated 2010  . Vitamin B12 deficiency (dietary) anemia 04/12/2011  . Shingles   . Sarcoma     right hand  . Malignant neoplasm of breast (female),  unspecified site     left  . Dupuytren's disease     left hand  . Vitamin B12 deficiency 04/02/2012  . Complication of anesthesia     " very cautious because of my COPD"  . Pacemaker    Past Surgical History  Procedure Laterality Date  . Nasal sinus surgery      x 3  . Appendectomy    . Hernia repair    . Abdominal hysterectomy    . Pacemaker insertion    . Eye surgery      x6-bilateral  . Finger amputation      right  . Foot surgery      right toes x 3  . Tonsillectomy    . Colonoscopy  01/2009    w/ablation of 3 mm AVM, diverticulosis, external hemorrhoids  . Upper gastrointestinal endoscopy  01/2009    w/polypectomy, AVM, hyperplastic polyp  . Abdominal hysterectomy    . Laparotomy  11/12/2011    Procedure: EXPLORATORY LAPAROTOMY;  Surgeon: Lodema Pilot, DO;  Location: [facility];  Service: General;  Laterality: N/A;  . Colostomy revision  11/12/2011    Procedure: COLON RESECTION SIGMOID;  Surgeon: Lodema Pilot, DO;  Location: [facility];  Service: General;  Laterality: N/A;  . Colostomy  11/12/2011    Procedure: COLOSTOMY;  Surgeon: Lodema Pilot, DO;  Location: [facility];  Service: General;  Laterality: Left;  End colostomy  .  Laparotomy  11/19/2011    Procedure: EXPLORATORY LAPAROTOMY;  Surgeon: Kandis Cocking, MD;  Location: [facility];  Service: General;  Laterality: N/A;  . Gastrostomy  11/19/2011    Procedure: GASTROSTOMY;  Surgeon: Kandis Cocking, MD;  Location: [facility];  Service: General;;  INSERTION GASTROSTOMY TUBE  . Application of wound vac  11/19/2011    Procedure: APPLICATION OF WOUND VAC;  Surgeon: Kandis Cocking, MD;  Location: [facility];  Service: General;  Laterality: N/A;   Prior to Admission medications   Medication Sig Start Date End Date Taking? Authorizing Provider  albuterol (PROVENTIL HFA;VENTOLIN HFA) 108 (90 BASE) MCG/ACT inhaler Inhale 2 puffs into the lungs 2 (two) times daily.   Yes Historical Provider, MD  albuterol (PROVENTIL) (2.5 MG/3ML) 0.083% nebulizer solution  Take 2.5 mg by nebulization every 4 (four) hours as needed for wheezing or shortness of breath.   Yes Historical Provider, MD  ALPRAZolam (XANAX) 0.25 MG tablet Take 0.25 mg by mouth every 4 (four) hours as needed for anxiety.   Yes Historical Provider, MD  amoxicillin-clavulanate (AUGMENTIN) 875-125 MG per tablet Take 2 tablets by mouth 2 (two) times daily. For 10 days last dose on 11/13/12   Yes Historical Provider, MD  beclomethasone (QVAR) 80 MCG/ACT inhaler Inhale 3 puffs into the lungs 3 (three) times daily.    Yes Historical Provider, MD  Budesonide (RHINOCORT AQUA NA) Place 1 spray into the nose daily.    Yes Historical Provider, MD  chlorpheniramine-HYDROcodone (TUSSIONEX) 10-8 MG/5ML LQCR Take 5 mLs by mouth at bedtime as needed (for cough).   Yes Historical Provider, MD  dexlansoprazole (DEXILANT) 60 MG capsule Take 60 mg by mouth daily before breakfast.   Yes Historical Provider, MD  DULoxetine (CYMBALTA) 30 MG capsule Take 30 mg by mouth daily.   Yes Historical Provider, MD  famotidine (PEPCID) 20 MG tablet Take 20 mg by mouth at bedtime as needed for heartburn.   Yes Historical Provider, MD  furosemide (LASIX) 20 MG tablet Take 20 mg by mouth daily as needed. For fluid retention.   Yes Historical Provider, MD  hydroxypropyl methylcellulose (ISOPTO TEARS) 2.5 % ophthalmic solution Place 2 drops into both eyes 2 (two) times daily.   Yes Historical Provider, MD  levothyroxine (SYNTHROID, LEVOTHROID) 50 MCG tablet Take 50 mcg by mouth daily before breakfast.  12/01/11 11/30/12 Yes Lesle Chris Black, NP  losartan (COZAAR) 50 MG tablet Take 50 mg by mouth daily.   Yes Historical Provider, MD  metoprolol succinate (TOPROL-XL) 50 MG 24 hr tablet TAKE 1 TABLET BY MOUTH EVERY DAY 06/14/12  Yes Marinus Maw, MD  mirtazapine (REMERON) 30 MG tablet Take 15 mg by mouth at bedtime.    Yes Historical Provider, MD  Multiple Vitamin (MULTIVITAMIN WITH MINERALS) TABS Take 1 tablet by mouth daily.   Yes  Historical Provider, MD  oxyCODONE (OXY IR/ROXICODONE) 5 MG immediate release tablet Take 5-10 mg by mouth every 4 (four) hours as needed (for cough / pain).    Yes Historical Provider, MD  polyethylene glycol (MIRALAX / GLYCOLAX) packet Take 17 g by mouth daily.   Yes Historical Provider, MD  pravastatin (PRAVACHOL) 40 MG tablet Take 40 mg by mouth daily.   Yes Historical Provider, MD  predniSONE (DELTASONE) 5 MG tablet Take 20 mg by mouth daily.    Yes Historical Provider, MD  promethazine (PHENERGAN) 25 MG tablet Take 25 mg by mouth every 6 (six) hours as needed. For nausea   Yes  Historical Provider, MD  senna-docusate (SENOKOT-S) 8.6-50 MG per tablet Take 1 tablet by mouth daily.   Yes Historical Provider, MD  Vitamin D, Ergocalciferol, (DRISDOL) 50000 UNITS CAPS Take 50,000 Units by mouth every 7 (seven) days. On Sundays   Yes Historical Provider, MD  zafirlukast (ACCOLATE) 20 MG tablet Take 20 mg by mouth 2 (two) times daily.   Yes Historical Provider, MD  zolpidem (AMBIEN) 10 MG tablet Take 10 mg by mouth at bedtime.    Yes Historical Provider, MD   Allergies  Allergen Reactions  . Aspirin Shortness Of Breath    Kicks up your asthma  . Sulfonamide Derivatives Hives  . Ibuprofen Other (See Comments)    Doesn't take because of her aspirin allergy  . Iron Dextran Itching     itching after test dose  . Metoclopramide Hcl Other (See Comments)    REACTION: chomping mouth   FAMILY HISTORY:  Family History  Problem Relation Age of Onset  . Emphysema Paternal Uncle   . Heart disease Mother   . Colon cancer Mother   . Pancreatic cancer Father   . Cancer Father 56    pancreatic cancer  . Tuberculosis Paternal Uncle   . Breast cancer Maternal Aunt   . Breast cancer Cousin   . Prostate cancer Cousin   . Colon cancer Cousin   . Uterine cancer Cousin    SOCIAL HISTORY:  reports that she quit smoking about 35 years ago. Her smoking use included Cigarettes. She has a 10.5 pack-year  smoking history. She has never used smokeless tobacco. She reports that  drinks alcohol. She reports that she does not use illicit drugs.  REVIEW OF SYSTEMS:   Constitutional: Negative for fever, chills, weight loss, malaise/fatigue and diaphoresis.  HENT: Negative for hearing loss, ear pain, nosebleeds, congestion, sore throat, neck pain, tinnitus and ear discharge.   Eyes: Negative for blurred vision, double vision, photophobia, pain, discharge and redness.  Respiratory: Positive for cough, SOB, wheezing and stridor but negative for hemoptysis and sputum production.   Cardiovascular: Negative for chest pain, palpitations, orthopnea, claudication, leg swelling and PND.  Gastrointestinal: Negative for heartburn, nausea, vomiting, abdominal pain, diarrhea, constipation, blood in stool and melena.  Genitourinary: Negative for dysuria, urgency, frequency, hematuria and flank pain.  Musculoskeletal: Negative for myalgias, back pain, joint pain and falls.  Skin: Negative for itching and rash.  Neurological: Negative for dizziness, tingling, tremors, sensory change, speech change, focal weakness, seizures, loss of consciousness, weakness and headaches.  Endo/Heme/Allergies: Negative for environmental allergies and polydipsia. Does not bruise/bleed easily.  SUBJECTIVE: Sporadic attacks of SOB.  VITAL SIGNS: Temp:  [98.4 F (36.9 C)-99.6 F (37.6 C)] 98.4 F (36.9 C) (05/27 0435) Pulse Rate:  [95-115] 115 (05/27 0503) Resp:  [19-27] 27 (05/27 0503) BP: (115-181)/(44-91) 181/91 mmHg (05/27 0503) SpO2:  [90 %-96 %] 90 % (05/27 0503) Weight:  [78.881 kg (173 lb 14.4 oz)] 78.881 kg (173 lb 14.4 oz) (05/27 0435)  PHYSICAL EXAMINATION: General:  Chronically ill appearing, anxious female. Neuro:  Alert and interactive, moving all ext to command. HEENT:  [place]/AT, PERRL, EOM-I and MMM. Neck:  Vibration over the neck with wheezes more audible over the neck, -LAN and -thyromegally. Cardiovascular:  RRR,  Nl S1/S2, -M/R/G. Lungs:  Upper airway wheezes. Abdomen:  Soft, NT, ND and +BS. Musculoskeletal:  -edema and -tenderness, missing finger on the right hand. Skin: Thin but intact.  Recent Labs Lab 11/12/12 2322 11/13/12 0650  NA 142  --  K 4.0  --   CL 99  --   CO2 28  --   BUN 29*  --   CREATININE 1.10 1.00  GLUCOSE 131*  --    Recent Labs Lab 11/12/12 2322 11/13/12 0650  HGB 13.4 12.4  HCT 42.4 38.4  WBC 16.8* 16.2*  PLT 274 216   Dg Chest Port 1 View  11/13/2012   *RADIOLOGY REPORT*  Clinical Data: Shortness of breath and chest pain.  PORTABLE CHEST - 1 VIEW  Comparison: Chest radiograph performed 11/06/2012  Findings: The lungs are well-aerated.  Vascular congestion is noted, without definite pulmonary edema.  Mild left basilar airspace opacity may reflect atelectasis or possibly mild pneumonia.  A small left pleural effusion is suspected.  No pneumothorax is seen.  The cardiomediastinal silhouette is borderline normal in size.  A pacemaker is seen overlying the left chest wall, with leads ending overlying the right atrium right ventricle.  Scattered clips are seen about the left axilla.  No acute osseous abnormalities are seen.  Chronic right-sided rib deformity is noted.  IMPRESSION: Vascular congestion, without definite pulmonary edema.  Mild left basilar airspace opacity may reflect atelectasis or possibly mild pneumonia.  Suspect small left pleural effusion.   Original Report Authenticated By: Tonia Ghent, M.D.   ASSESSMENT / PLAN:  79 year old female with asthma history, ?COPD and VCD.  This is less likely an exacerbation of asthma and more likely a VCD issue.  Patient has been on narcotics and benzos, cough has been controlled for the most part.  GERD and PND has been addressed as well via PPI and nasal steroids.  Anxiety seems to be the major issue here.  I have seen no spirometry studies or true evaluation of her diaphragmatic function.  Plan: - SNIF test. -  Spirometry. - Speech pathology to evaluate. - Levofloxacin as ordered per primary. - No need to increase steroids further. - Continue GERT and PND control. - Agree with current bronchodilator regiment. - Will need PFTs when improved as outpatient. - Will f/u post spirometry in AM.  Alyson Reedy, M.D. [facility] Pulmonary/Critical Care Medicine. Pager: [phone]. After hours pager: [phone].

## 2012-11-13 NOTE — Progress Notes (Signed)
79yo female c/o SOB x8hr despite O2 and prednisone, CXR shows atelectasis vs PNA, to begin IV ABX.  Rec'd Levaquin 500mg  IV in ED; will continue with Levaquin 750mg  IV Q48H for CrCl ~45 ml/min and monitor progression.  Vernard Gambles, PharmD, BCPS  11/13/2012 5:51 AM

## 2012-11-13 NOTE — ED Notes (Signed)
MD at bedside. 

## 2012-11-13 NOTE — ED Notes (Signed)
[facility] at bedside preparing patient for transport.  

## 2012-11-13 NOTE — Progress Notes (Signed)
Patient evaluated for community based chronic disease management services with [facility] Care Management Program as a benefit of patient's [employer].  Spoke with patient at bedside to explain [facility] Care Management services. Services have been declined.  Patient maintains that although her medical needs are complex she and her husband can self manage at this time.  Left contact information and [facility] literature at bedside. Made inpatient Case Manager aware that [facility] Care Management has consulted. Of note, [facility] Care Management services does not replace or interfere with any services that are arranged by inpatient case management or social work.  For additional questions or referrals please contact Anibal Henderson BSN RN [facility] Liaison at [phone].

## 2012-11-13 NOTE — H&P (Signed)
[facility] History and Physical  [name] ZHY:[id number] DOB: 1934/06/20 DOA: 11/12/2012  Referring physician: ER physician. Patient was transferred from [facility] ER. PCP: [name], MD  Specialists: Dr. Sherene Sires.  Chief Complaint: Shortness of breath.  HPI: [name] is a 79 y.o. female with known history of asthma, vocal cord dysfunction, CHF, atrial fibrillation status post pacemaker placement history experiencing episodes where patient gets very short of breath and feels she is choking. This has been ongoing for last 2 weeks. Patient is following with a pulmonologist and asthma specialist. Her pulmonologist had started on oxycodone Ativan for these episodes which per patient was felt to be secondary to vocal cord dysfunction. And her asthma specialist had started her on antibiotics initially Z-Pak and then Augmentin. Patient has been taking a candidate for last 2 weeks. Last 2-3 days her symptoms are worsened and patient also has noticed mild swelling in her lower extremities. In the ER patient had chest x-ray which showed possibility of pneumonia and patient also was mildly hypoxic. Patient has been admitted for further management. Patient denies any chest pain. Has been having some cough which is been chronic. Denies any fever chills. Denies any nausea vomiting abdominal pain or diarrhea.  Review of Systems: As presented in the history of presenting illness, rest negative.  Past Medical History  Diagnosis Date  . Insomnia, unspecified   . Osteoarthrosis, unspecified whether generalized or localized, unspecified site   . Unspecified hypothyroidism   . Unspecified essential hypertension   . Atrial fibrillation   . Sciatica   . Anemia   . COPD (chronic obstructive pulmonary disease)   . UTI (urinary tract infection)   . GERD (gastroesophageal reflux disease)   . Pneumonia   . Asthma   . Clostridium difficile colitis   . Diverticulitis of sigmoid colon 11/2010     with abscess/contained perforation  . Dementia     mild  . Dehydration with hyponatremia 12/2010    hospitalized  . Angiodysplasia of stomach     ablated 2010  . Angiodysplasia of colon     ablated 2010  . Vitamin B12 deficiency (dietary) anemia 04/12/2011  . Shingles   . Sarcoma     right hand  . Malignant neoplasm of breast (female), unspecified site     left  . Dupuytren's disease     left hand  . Vitamin B12 deficiency 04/02/2012  . Complication of anesthesia     " very cautious because of my COPD"  . Pacemaker    Past Surgical History  Procedure Laterality Date  . Nasal sinus surgery      x 3  . Appendectomy    . Hernia repair    . Abdominal hysterectomy    . Pacemaker insertion    . Eye surgery      x6-bilateral  . Finger amputation      right  . Foot surgery      right toes x 3  . Tonsillectomy    . Colonoscopy  01/2009    w/ablation of 3 mm AVM, diverticulosis, external hemorrhoids  . Upper gastrointestinal endoscopy  01/2009    w/polypectomy, AVM, hyperplastic polyp  . Abdominal hysterectomy    . Laparotomy  11/12/2011    Procedure: EXPLORATORY LAPAROTOMY;  Surgeon: Lodema Pilot, DO;  Location: [facility];  Service: General;  Laterality: N/A;  . Colostomy revision  11/12/2011    Procedure: COLON RESECTION SIGMOID;  Surgeon: Lodema Pilot, DO;  Location: [facility];  Service: General;  Laterality: N/A;  . Colostomy  11/12/2011    Procedure: COLOSTOMY;  Surgeon: Lodema Pilot, DO;  Location: [facility];  Service: General;  Laterality: Left;  End colostomy  . Laparotomy  11/19/2011    Procedure: EXPLORATORY LAPAROTOMY;  Surgeon: Kandis Cocking, MD;  Location: [facility];  Service: General;  Laterality: N/A;  . Gastrostomy  11/19/2011    Procedure: GASTROSTOMY;  Surgeon: Kandis Cocking, MD;  Location: [facility];  Service: General;;  INSERTION GASTROSTOMY TUBE  . Application of wound vac  11/19/2011    Procedure: APPLICATION OF WOUND VAC;  Surgeon: Kandis Cocking, MD;  Location: [facility];   Service: General;  Laterality: N/A;   Social History:  reports that she quit smoking about 35 years ago. Her smoking use included Cigarettes. She has a 10.5 pack-year smoking history. She has never used smokeless tobacco. She reports that  drinks alcohol. She reports that she does not use illicit drugs. Lives at home. where does patient live-- Can do ADLs. Can patient participate in ADLs?  Allergies  Allergen Reactions  . Aspirin Shortness Of Breath    Kicks up your asthma  . Sulfonamide Derivatives Hives  . Ibuprofen Other (See Comments)    Doesn't take because of her aspirin allergy  . Iron Dextran Itching     itching after test dose  . Metoclopramide Hcl Other (See Comments)    REACTION: chomping mouth    Family History  Problem Relation Age of Onset  . Emphysema Paternal Uncle   . Heart disease Mother   . Colon cancer Mother   . Pancreatic cancer Father   . Cancer Father 21    pancreatic cancer  . Tuberculosis Paternal Uncle   . Breast cancer Maternal Aunt   . Breast cancer Cousin   . Prostate cancer Cousin   . Colon cancer Cousin   . Uterine cancer Cousin       Prior to Admission medications   Medication Sig Start Date End Date Taking? Authorizing Provider  predniSONE (DELTASONE) 10 MG tablet Take 40 mg by mouth daily.   Yes Historical Provider, MD  albuterol (PROVENTIL HFA;VENTOLIN HFA) 108 (90 BASE) MCG/ACT inhaler Inhale 2 puffs into the lungs 2 (two) times daily.    Historical Provider, MD  albuterol (PROVENTIL) (2.5 MG/3ML) 0.083% nebulizer solution Take 2.5 mg by nebulization every 6 (six) hours as needed. For shortness of breath    Historical Provider, MD  ALPRAZolam (XANAX) 0.25 MG tablet Take 0.25 mg by mouth at bedtime as needed. For sleep    Historical Provider, MD  amoxicillin-clavulanate (AUGMENTIN) 875-125 MG per tablet Take 2 tablets by mouth 2 (two) times daily.    Historical Provider, MD  beclomethasone (QVAR) 80 MCG/ACT inhaler Inhale 3 puffs into the  lungs 3 (three) times daily.     Historical Provider, MD  Budesonide (RHINOCORT AQUA NA) Place 1 spray into the nose daily.     Historical Provider, MD  dexlansoprazole (DEXILANT) 60 MG capsule Take 60 mg by mouth daily before breakfast.    Historical Provider, MD  DULoxetine (CYMBALTA) 30 MG capsule Take 30 mg by mouth daily.    Historical Provider, MD  furosemide (LASIX) 20 MG tablet Take 20 mg by mouth daily as needed. For fluid retention.    Historical Provider, MD  HYDROcodone-acetaminophen (NORCO) 10-325 MG per tablet Take 1 tablet by mouth every 6 (six) hours as needed for pain.    Historical Provider, MD  hydroxypropyl methylcellulose (  ISOPTO TEARS) 2.5 % ophthalmic solution Place 2 drops into both eyes 2 (two) times daily.    Historical Provider, MD  levothyroxine (SYNTHROID, LEVOTHROID) 50 MCG tablet Take 50 mcg by mouth daily before breakfast.  12/01/11 11/30/12  Gwenyth Bender, NP  losartan (COZAAR) 50 MG tablet Take 50 mg by mouth daily.    Historical Provider, MD  metoprolol succinate (TOPROL-XL) 50 MG 24 hr tablet TAKE 1 TABLET BY MOUTH EVERY DAY 06/14/12   Marinus Maw, MD  mirtazapine (REMERON) 30 MG tablet Take 15 mg by mouth at bedtime.     Historical Provider, MD  Multiple Vitamin (MULTIVITAMIN WITH MINERALS) TABS Take 1 tablet by mouth daily.    Historical Provider, MD  oxyCODONE (OXY IR/ROXICODONE) 5 MG immediate release tablet Take 1-2 tablets (5-10 mg total) by mouth every 4 (four) hours as needed for pain. 11/02/12   Nyoka Cowden, MD  polyethylene glycol [facility] Ethelene Hal) packet Take 17 g by mouth daily.    Historical Provider, MD  pravastatin (PRAVACHOL) 40 MG tablet Take 40 mg by mouth daily.    Historical Provider, MD  predniSONE (DELTASONE) 5 MG tablet Take 20 mg by mouth daily.     Historical Provider, MD  promethazine (PHENERGAN) 25 MG tablet Take 25 mg by mouth every 6 (six) hours as needed. For nausea    Historical Provider, MD  Respiratory Therapy Supplies  (FLUTTER) DEVI Use as directed 11/02/12   Nyoka Cowden, MD  senna-docusate (SENOKOT-S) 8.6-50 MG per tablet Take 1 tablet by mouth daily.    Historical Provider, MD  Vitamin D, Ergocalciferol, (DRISDOL) 50000 UNITS CAPS Take 50,000 Units by mouth every 7 (seven) days. On Sundays    Historical Provider, MD  zafirlukast (ACCOLATE) 20 MG tablet Take 20 mg by mouth 2 (two) times daily.    Historical Provider, MD  zolpidem (AMBIEN) 10 MG tablet Take 10 mg by mouth at bedtime.     Historical Provider, MD   Physical Exam: Filed Vitals:   11/13/12 0002 11/13/12 0158 11/13/12 0435 11/13/12 0503  BP:  115/44 135/64 181/91  Pulse:  101 95 115  Temp:  99.3 F (37.4 C) 98.4 F (36.9 C)   TempSrc:   Oral   Resp:  19 19 27   Height:   5\' 4"  (1.626 m)   Weight:   78.881 kg (173 lb 14.4 oz)   SpO2: 96% 90% 96% 90%     General:  Well-developed well-nourished.  Eyes: Anicteric no pallor.  ENT: No discharge from the ears eyes nose or mouth.  Neck: No mass felt.  Cardiovascular: S1-S2 heard.  Respiratory: No rhonchi or crepitations.  Abdomen: Soft nontender bowel sounds present. Colostomy bag.  Skin: No rash.  Musculoskeletal: Mild edema bilaterally lower extremities.  Psychiatric: Appears normal.  Neurologic: Alert awake oriented to time place and person. No lower extremities.  Labs on Admission:  Basic Metabolic Panel:  Recent Labs Lab 11/12/12 2322  NA 142  K 4.0  CL 99  CO2 28  GLUCOSE 131*  BUN 29*  CREATININE 1.10  CALCIUM 10.9*   Liver Function Tests: No results found for this basename: AST, ALT, ALKPHOS, BILITOT, PROT, ALBUMIN,  in the last 168 hours No results found for this basename: LIPASE, AMYLASE,  in the last 168 hours No results found for this basename: AMMONIA,  in the last 168 hours CBC:  Recent Labs Lab 11/12/12 2322  WBC 16.8*  NEUTROABS 15.9*  HGB 13.4  HCT  42.4  MCV 90.2  PLT 274   Cardiac Enzymes:  Recent Labs Lab 11/12/12 2322   TROPONINI <0.30    BNP (last 3 results)  Recent Labs  11/16/11 0605 12/03/11 0435 11/12/12 2322  PROBNP >70000.0* 2410.0* 1782.0*   CBG: No results found for this basename: GLUCAP,  in the last 168 hours  Radiological Exams on Admission: Dg Chest Port 1 View  11/13/2012   *RADIOLOGY REPORT*  Clinical Data: Shortness of breath and chest pain.  PORTABLE CHEST - 1 VIEW  Comparison: Chest radiograph performed 11/06/2012  Findings: The lungs are well-aerated.  Vascular congestion is noted, without definite pulmonary edema.  Mild left basilar airspace opacity may reflect atelectasis or possibly mild pneumonia.  A small left pleural effusion is suspected.  No pneumothorax is seen.  The cardiomediastinal silhouette is borderline normal in size.  A pacemaker is seen overlying the left chest wall, with leads ending overlying the right atrium right ventricle.  Scattered clips are seen about the left axilla.  No acute osseous abnormalities are seen.  Chronic right-sided rib deformity is noted.  IMPRESSION: Vascular congestion, without definite pulmonary edema.  Mild left basilar airspace opacity may reflect atelectasis or possibly mild pneumonia.  Suspect small left pleural effusion.   Original Report Authenticated By: Tonia Ghent, M.D.    EKG: Independently reviewed. Paced rhythm.  Assessment/Plan Principal Problem:   Pneumonia Active Problems:   HYPOTHYROIDISM   Atrial fibrillation   CHF (congestive heart failure)   1. Shortness of breath most likely secondary pneumonia and mild CHF - continue his Levaquin. I have ordered one dose of Lasix 20 mg IV and continue with 20 mg by mouth daily as taken at home. Closely follow intake output and daily weights and metabolic panel. 2. Asthma and vocal cord dysfunction - patient was wheezing in the ER presently on any wheezing. Continue with nebulizer Pulmicort and IV steroids. Patient also is on when necessary oxycodone and Ativan. 3. Atrial  fibrillation - presently rate controlled but rate easily changes from 60-120. Closely monitor in telemetry. Patient is not on Coumadin secondary to falls. 4. Hypothyroidism - continue Synthroid. Check TSH. 5. Hyperlipidemia - continue present medications. 6. History of diverticulitis status post surgery and presently has colostomy bag.    Code Status: Full code.  Family Communication: Patient's husband at the bedside.  Disposition Plan: Admit to inpatient.    Ravina Milner N. [facility] Pager [phone].  If 7PM-7AM, please contact night-coverage [email or web address] Password [facility] 11/13/2012, 5:44 AM

## 2012-11-13 NOTE — Progress Notes (Signed)
Upon admission, patient appeared to be in distress.  RN notified Rapid Response nurse.  Albuterol given x1.  RN will continue to monitor. Louretta Parma, RN

## 2012-11-13 NOTE — ED Notes (Signed)
Brett Canales, RTT at bedside.

## 2012-11-13 NOTE — ED Notes (Signed)
Report given to [facility] RN. ETA 10 minutes

## 2012-11-13 NOTE — Consult Note (Signed)
WOC consult Note Reason for Consult: Consult requested for abd wound.  Pt states she does not have an open wound but has an extensive scar from previous surgical site to abd.  She is preparing to roll downstairs for a procedure at this time and only a brief glimpse of the site was provided.  Purple intact scar tissue without open wound or drainage noted.  Pt appears to wear a dressing to protect scar tissue from abrasion. She provided specific instructions for cleansing and uses an ABD pad and tape changed Q week.  Pt has a colostomy and is independent with pouching.  Pouch intact with good seal and mod amt semi formed stool. 2 piece pouching supplies ordered to bedside for patient use.  She also wears a belt to secure pouch.  Please re-consult if further assistance is needed.  Thank-you,  Cammie Mcgee MSN, RN, CWOCN, [place], CNS [phone]

## 2012-11-14 DIAGNOSIS — J45901 Unspecified asthma with (acute) exacerbation: Secondary | ICD-10-CM

## 2012-11-14 DIAGNOSIS — E039 Hypothyroidism, unspecified: Secondary | ICD-10-CM

## 2012-11-14 DIAGNOSIS — I5021 Acute systolic (congestive) heart failure: Principal | ICD-10-CM

## 2012-11-14 DIAGNOSIS — K439 Ventral hernia without obstruction or gangrene: Secondary | ICD-10-CM

## 2012-11-14 DIAGNOSIS — J9610 Chronic respiratory failure, unspecified whether with hypoxia or hypercapnia: Secondary | ICD-10-CM

## 2012-11-14 DIAGNOSIS — F411 Generalized anxiety disorder: Secondary | ICD-10-CM

## 2012-11-14 LAB — BASIC METABOLIC PANEL
BUN: 28 mg/dL — ABNORMAL HIGH (ref 6–23)
Chloride: 98 mEq/L (ref 96–112)
Creatinine, Ser: 1.09 mg/dL (ref 0.50–1.10)
GFR calc Af Amer: 56 mL/min — ABNORMAL LOW (ref >90)
Glucose, Bld: 87 mg/dL (ref 70–99)
Potassium: 3.7 mEq/L (ref 3.5–5.1)

## 2012-11-14 LAB — CBC
HCT: 34.4 % — ABNORMAL LOW (ref 36.0–46.0)
Hemoglobin: 10.9 g/dL — ABNORMAL LOW (ref 12.0–15.0)
MCH: 28.2 pg (ref 26.0–34.0)
MCHC: 31.7 g/dL (ref 30.0–36.0)
MCV: 88.9 fL (ref 78.0–100.0)
RDW: 15.2 % (ref 11.5–15.5)

## 2012-11-14 MED ORDER — ALBUTEROL SULFATE (5 MG/ML) 0.5% IN NEBU
2.5000 mg | INHALATION_SOLUTION | Freq: Three times a day (TID) | RESPIRATORY_TRACT | Status: DC
  Administered 2012-11-15 – 2012-11-16 (×5): 2.5 mg via RESPIRATORY_TRACT
  Filled 2012-11-14 (×4): qty 0.5

## 2012-11-14 MED ORDER — ALPRAZOLAM 0.25 MG PO TABS
0.2500 mg | ORAL_TABLET | ORAL | Status: DC | PRN
  Administered 2012-11-14 – 2012-11-15 (×3): 0.25 mg via ORAL
  Filled 2012-11-14 (×3): qty 1

## 2012-11-14 MED ORDER — FUROSEMIDE 10 MG/ML IJ SOLN
40.0000 mg | Freq: Two times a day (BID) | INTRAMUSCULAR | Status: DC
  Administered 2012-11-14 – 2012-11-16 (×4): 40 mg via INTRAVENOUS
  Filled 2012-11-14 (×6): qty 4

## 2012-11-14 MED ORDER — SENNOSIDES-DOCUSATE SODIUM 8.6-50 MG PO TABS
2.0000 | ORAL_TABLET | Freq: Every day | ORAL | Status: DC
  Administered 2012-11-15: 1 via ORAL
  Filled 2012-11-14 (×2): qty 2

## 2012-11-14 MED ORDER — PREDNISONE 20 MG PO TABS
40.0000 mg | ORAL_TABLET | Freq: Every day | ORAL | Status: DC
  Administered 2012-11-15: 40 mg via ORAL
  Filled 2012-11-14 (×2): qty 2

## 2012-11-14 NOTE — Evaluation (Signed)
Speech Language Pathology Evaluation Patient Details Name: [name] MRN: [id number] DOB: 01-Apr-1934 Today's Date: 11/14/2012 Time: 4098-1191 SLP Time Calculation (min): 25 min  Problem List:  Patient Active Problem List   Diagnosis Date Noted  . Ventral hernia 11/14/2012  . Anxiety state, unspecified 11/14/2012  . Pneumonia 11/13/2012  . Acute systolic heart failure 11/13/2012  . Vocal cord dysfunction 11/13/2012  . Hypoxemia 11/13/2012  . Asthma with acute exacerbation 11/13/2012  . Chronic respiratory failure 11/09/2012  . Fracture of multiple pubic rami 04/19/2012  . Vitamin B12 deficiency 04/02/2012  . Acute respiratory failure 11/20/2011  . History of hypertension 11/12/2011  . Respiratory failure following trauma and surgery 11/12/2011  . Chronic obstructive asthma 11/12/2011  . Perforated diverticulitis 11/12/2011  . Peritonitis 11/12/2011  . Hypotension 11/12/2011  . Hx of Clostridium difficile infection 07/26/2011  . Chronic LLQ pain and tenderness 04/19/2011  . Loose stools 04/19/2011  . Vitamin B12 deficiency (dietary) anemia 04/12/2011  . Dementia 01/28/2011  . Warfarin anticoagulation 12/21/2010  . MULTIFOCAL ATRIAL TACHYCARDIA 07/28/2010  . ALLERGIC RHINITIS 04/01/2010  . GERD 04/01/2010  . PPM-[employer]tr[employer] 01/23/2009  . ANEMIA, SECONDARY TO CHRONIC BLOOD LOSS 01/05/2009  . Other diseases of vocal cords 10/04/2007  . HISTORY OF SARCOMA RIGHT HAND 09/21/2007  . HEART FAILURE 09/21/2007  . INSOMNIA 09/21/2007  . HISTORY OF BREAST CANCER 09/20/2007  . HYPOTHYROIDISM 09/20/2007  . Atrial fibrillation 09/20/2007  . ASTHMA 09/20/2007  . OSTEOARTHRITIS 09/20/2007   Past Medical History:  Past Medical History  Diagnosis Date  . Insomnia, unspecified   . Osteoarthrosis, unspecified whether generalized or localized, unspecified site   . Unspecified hypothyroidism   . Unspecified essential hypertension   . Atrial fibrillation   . Sciatica   . Anemia   . COPD  (chronic obstructive pulmonary disease)   . UTI (urinary tract infection)   . GERD (gastroesophageal reflux disease)   . Pneumonia   . Asthma   . Clostridium difficile colitis   . Diverticulitis of sigmoid colon 11/2010    with abscess/contained perforation  . Dementia     mild  . Dehydration with hyponatremia 12/2010    hospitalized  . Angiodysplasia of stomach     ablated 2010  . Angiodysplasia of colon     ablated 2010  . Vitamin B12 deficiency (dietary) anemia 04/12/2011  . Shingles   . Sarcoma     right hand  . Malignant neoplasm of breast (female), unspecified site     left  . Dupuytren's disease     left hand  . Vitamin B12 deficiency 04/02/2012  . Complication of anesthesia     " very cautious because of my COPD"  . Pacemaker    Past Surgical History:  Past Surgical History  Procedure Laterality Date  . Nasal sinus surgery      x 3  . Appendectomy    . Hernia repair    . Abdominal hysterectomy    . Pacemaker insertion    . Eye surgery      x6-bilateral  . Finger amputation      right  . Foot surgery      right toes x 3  . Tonsillectomy    . Colonoscopy  01/2009    w/ablation of 3 mm AVM, diverticulosis, external hemorrhoids  . Upper gastrointestinal endoscopy  01/2009    w/polypectomy, AVM, hyperplastic polyp  . Abdominal hysterectomy    . Laparotomy  11/12/2011    Procedure: EXPLORATORY LAPAROTOMY;  Surgeon: Arlys John  Biagio Quint, DO;  Location: [facility];  Service: General;  Laterality: N/A;  . Colostomy revision  11/12/2011    Procedure: COLON RESECTION SIGMOID;  Surgeon: Lodema Pilot, DO;  Location: [facility];  Service: General;  Laterality: N/A;  . Colostomy  11/12/2011    Procedure: COLOSTOMY;  Surgeon: Lodema Pilot, DO;  Location: [facility];  Service: General;  Laterality: Left;  End colostomy  . Laparotomy  11/19/2011    Procedure: EXPLORATORY LAPAROTOMY;  Surgeon: Kandis Cocking, MD;  Location: [facility];  Service: General;  Laterality: N/A;  . Gastrostomy  11/19/2011     Procedure: GASTROSTOMY;  Surgeon: Kandis Cocking, MD;  Location: [facility];  Service: General;;  INSERTION GASTROSTOMY TUBE  . Application of wound vac  11/19/2011    Procedure: APPLICATION OF WOUND VAC;  Surgeon: Kandis Cocking, MD;  Location: [facility];  Service: General;  Laterality: N/A;   HPI:  [name] is a 79 y.o. female with known history of asthma, vocal cord dysfunction (pt. states she was diagnosed with "dysphonia"), CHF, COPD, UTI atrial fibrillation status post pacemaker placement history experiencing episodes where patient gets very short of breath and feels she is choking.  Her pulmonologist had started on oxycodone Ativan for these episodes which per patient was felt to be secondary to vocal cord dysfunction. In the ER patient had chest x-ray which showed possibility of pneumonia and patient also was mildly hypoxic.  Has been having some cough which is been chronic.    Assessment / Plan / Recommendation Clinical Impression  MD requested pt. be seen for VCD.  During interview with pt. and spouse, pt. stated she was told years ago that she was diagnosed with "dysphonia" and has been seen at [facility]. Pulmonary MD's have seen pt. and suspect she has VCD.  She also reports seeing a Doctor, general practice for vocal cord dysfunction in the past.  SLP reviewed several techniques to facilitate pt.'s with PVCD/PVD.  Pt. reported MD, during this admission, recommended she return to [facility] voice center.  SLP agrees that work up for PVCD is warranted.  SLP gave pt. contact information regarding SLP at [facility] outpt clinic who treats PVCD if that is her diagnosis.  Pt. would benefit from SLP at outpatient facility.  No f/u at present.  Please contact if questions arise.      SLP Assessment  All further Speech Lanaguage Pathology  needs can be addressed in the next venue of care    Follow Up Recommendations  Outpatient SLP    Frequency and Duration        Pertinent Vitals/Pain none       SLP  Evaluation Prior Functioning  Cognitive/Linguistic Baseline: Within functional limits (appears functional) Lives With: Spouse   Cognition  Overall Cognitive Status: Within Functional Limits for tasks assessed    Comprehension  Auditory Comprehension Overall Auditory Comprehension: Appears within functional limits for tasks assessed Visual Recognition/Discrimination Discrimination: Not tested Reading Comprehension Reading Status: Not tested    Expression Expression Primary Mode of Expression: Verbal Verbal Expression Overall Verbal Expression: Appears within functional limits for tasks assessed Written Expression Dominant Hand: Left   Oral / Motor Oral Motor/Sensory Function Overall Oral Motor/Sensory Function: Appears within functional limits for tasks assessed Motor Speech Overall Motor Speech: Appears within functional limits for tasks assessed Intelligibility: Intelligible Motor Planning: Witnin functional limits   GO     Breck Coons [employer]Ed [employer] [phone]  11/14/2012

## 2012-11-14 NOTE — Progress Notes (Signed)
Chart  And old records reviewed.  [facility] PROGRESS NOTE  [name] ZOX:[id number] DOB: 09-20-1933 DOA: 11/12/2012 PCP: [name], MD  Assessment/Plan: ]   Pneumonia: on abx    HYPOTHYROIDISM:  Check TSH    Atrial fibrillation    Acute systolic heart failure:  Could be contributing to dyspnea.  Weight up 20 lbs since 12/13. Has edema and elevated pBNP.  No echo since 2011 at which time EF was 35%.  Will repeat and change lasix to 40 IV bid    Vocal cord dysfunction:  On PPI.  outpt f/u with [facility]NT    Hypoxemia: on home O2    Asthma with acute exacerbation:  No wheeze today.  More pseudowheeze.  Change to po steroids and taper rapidly    Ventral hernia, large, likely contributes to respiratory difficulty    Anxiety state, unspecified seems to play a role, I agree.  Code Status: DNR Family Communication: husband Disposition Plan: home   Consultants:  CCM  Procedures:    Antibiotics:  levaquin  HPI/Subjective: Feels better than yesterday.  Multiple questions and concerns.  C/o leg edema. Tried abdominal binder for ventral hernia, but made her claustrophobic.  Usually takes lasix PRN. Weight has increased.  Orthopnea present.  Sleeps upright.   Objective: Filed Vitals:   11/14/12 0530 11/14/12 0937 11/14/12 0954 11/14/12 1300  BP: 147/78  149/79 122/52  Pulse: 98  108 91  Temp: 97.2 F (36.2 C)   97.4 F (36.3 C)  TempSrc: Oral   Oral  Resp: 20   18  Height:      Weight: 77.384 kg (170 lb 9.6 oz)     SpO2: 94% 96%  95%    Intake/Output Summary (Last 24 hours) at 11/14/12 1425 Last data filed at 11/14/12 1300  Gross per 24 hour  Intake    720 ml  Output   1075 ml  Net   -355 ml   Filed Weights   11/13/12 0435 11/14/12 0530  Weight: 78.881 kg (173 lb 14.4 oz) 77.384 kg (170 lb 9.6 oz)    Exam:   General:  Initially, pursed lip breathing, but this resolved spontaneously and patient talked nearly continuously for about 15  minutes.  Cardiovascular: RRR  Respiratory: Good air movement.  Pseudowheeze.  No wheeze or rhonchi or rales  Abdomen: colostomy bag noted.  Large ventral hernia  Ext:  2 + pitting edema.  Diminished pedal pulses  Data Reviewed: Basic Metabolic Panel:  Recent Labs Lab 11/12/12 2322 11/13/12 0650 11/14/12 0510  NA 142  --  140  K 4.0  --  3.7  CL 99  --  98  CO2 28  --  32  GLUCOSE 131*  --  87  BUN 29*  --  28*  CREATININE 1.10 1.00 1.09  CALCIUM 10.9*  --  10.1   Liver Function Tests: No results found for this basename: AST, ALT, ALKPHOS, BILITOT, PROT, ALBUMIN,  in the last 168 hours No results found for this basename: LIPASE, AMYLASE,  in the last 168 hours No results found for this basename: AMMONIA,  in the last 168 hours CBC:  Recent Labs Lab 11/12/12 2322 11/13/12 0650 11/14/12 0510  WBC 16.8* 16.2* 14.1*  NEUTROABS 15.9*  --   --   HGB 13.4 12.4 10.9*  HCT 42.4 38.4 34.4*  MCV 90.2 88.7 88.9  PLT 274 216 219   Cardiac Enzymes:  Recent Labs Lab 11/12/12 2322  TROPONINI <0.30  BNP (last 3 results)  Recent Labs  11/16/11 0605 12/03/11 0435 11/12/12 2322  PROBNP >70000.0* 2410.0* 1782.0*   CBG: No results found for this basename: GLUCAP,  in the last 168 hours  Recent Results (from the past 240 hour(s))  MRSA PCR SCREENING     Status: None   Collection Time    11/13/12  4:40 AM      Result Value Range Status   MRSA by PCR NEGATIVE  NEGATIVE Final   Comment:            The GeneXpert MRSA Assay (FDA     approved for NASAL specimens     only), is one component of a     comprehensive MRSA colonization     surveillance program. It is not     intended to diagnose MRSA     infection nor to guide or     monitor treatment for     MRSA infections.     Studies: Dg Sniff Test  11/13/2012   *RADIOLOGY REPORT*  Clinical Data: Worsening shortness of breath.  SNIFF TEST  Comparison: None.  Findings: 17 seconds of fluoroscopy time were used in  conjunction with this test.  There is bilateral and symmetric diaphragmatic excursion.  IMPRESSION: Negative sniff test.   Original Report Authenticated By: Holley Dexter, M.D.   Dg Chest Port 1 View  11/13/2012   *RADIOLOGY REPORT*  Clinical Data: Shortness of breath and chest pain.  PORTABLE CHEST - 1 VIEW  Comparison: Chest radiograph performed 11/06/2012  Findings: The lungs are well-aerated.  Vascular congestion is noted, without definite pulmonary edema.  Mild left basilar airspace opacity may reflect atelectasis or possibly mild pneumonia.  A small left pleural effusion is suspected.  No pneumothorax is seen.  The cardiomediastinal silhouette is borderline normal in size.  A pacemaker is seen overlying the left chest wall, with leads ending overlying the right atrium right ventricle.  Scattered clips are seen about the left axilla.  No acute osseous abnormalities are seen.  Chronic right-sided rib deformity is noted.  IMPRESSION: Vascular congestion, without definite pulmonary edema.  Mild left basilar airspace opacity may reflect atelectasis or possibly mild pneumonia.  Suspect small left pleural effusion.   Original Report Authenticated By: Tonia Ghent, M.D.    Scheduled Meds: . albuterol  2.5 mg Nebulization Q6H  . budesonide (PULMICORT) nebulizer solution  0.25 mg Nebulization BID  . DULoxetine  30 mg Oral Daily  . enoxaparin (LOVENOX) injection  40 mg Subcutaneous Q24H  . furosemide  40 mg Intravenous BID  . levofloxacin (LEVAQUIN) IV  750 mg Intravenous Q48H  . levothyroxine  50 mcg Oral QAC breakfast  . losartan  50 mg Oral Daily  . mirtazapine  15 mg Oral QHS  . multivitamin with minerals  1 tablet Oral Daily  . nebivolol  10 mg Oral Daily  . pantoprazole  40 mg Oral Daily  . polyethylene glycol  17 g Oral Daily  . polyvinyl alcohol  2 drop Both Eyes BID  . [START ON 11/15/2012] predniSONE  40 mg Oral Q breakfast  . [START ON 11/15/2012] senna-docusate  2 tablet Oral QHS  .  simvastatin  20 mg Oral q1800  . sodium chloride  3 mL Intravenous Q12H  . [START ON 11/18/2012] Vitamin D (Ergocalciferol)  50,000 Units Oral Q7 days  . zafirlukast  20 mg Oral BID  . zolpidem  5 mg Oral QHS   Continuous Infusions:   Time spent: 40  min  Leonette Tischer L  [facility] Pager [phone]. If 7PM-7AM, please contact night-coverage at [email or web address], password [facility] 11/14/2012, 2:25 PM  LOS: 2 days

## 2012-11-14 NOTE — Progress Notes (Addendum)
PULMONARY  / CRITICAL CARE MEDICINE  Name: [name] MRN: [id number] DOB: Feb 06, 1934    ADMISSION DATE:  11/12/2012 CONSULTATION DATE:  11/13/2012  REFERRING MD :  Gwenlyn Perking PRIMARY SERVICE:  [facility]  CHIEF COMPLAINT:  SOB and VCD  BRIEF PATIENT DESCRIPTION: 79 year old with history of asthma, chronic prednisone (7.5 mg/day) and O2 dependent at 3L [place] at home presenting to the hospital with 2 wks history of worsening SOB due to "attacks" that take 20 minutes to resolve.  Sees [facility]  for VCD and asthma.  Was started on a prednisone dose increase of 20 mg/day and augmentin x14 days that she is almost completed.  PPI has been started years ago and rhinocort for PND.  Interestingly, SOB started 2 years ago after major abdominal surgery for diverticulitis complicated by wound dehiscence and was told at the time that her diaphragm was no longer functional.  Have tried mucinex in the back with little effect.  SIGNIFICANT EVENTS / STUDIES:  5/26 admission for SOB.  LINES / TUBES: PIV  CULTURES: None  ANTIBIOTICS: Levofloxacin 5/26>>>  SUBJECTIVE:  Not as spastic,  Has long hx of severe VCD  VITAL SIGNS: Temp:  [97.2 F (36.2 C)-99.1 F (37.3 C)] 97.2 F (36.2 C) (05/28 0530) Pulse Rate:  [92-108] 108 (05/28 0954) Resp:  [20-22] 20 (05/28 0530) BP: (132-152)/(68-79) 149/79 mmHg (05/28 0954) SpO2:  [94 %-96 %] 96 % (05/28 0937) Weight:  [77.384 kg (170 lb 9.6 oz)] 77.384 kg (170 lb 9.6 oz) (05/28 0530)  PHYSICAL EXAMINATION: General:  Chronically ill appearing, anxious female. Neuro:  Alert and interactive, moving all ext to command. HEENT:  [place]/AT, PERRL, EOM-I and MMM. Neck:  Vibration over the neck with wheezes more audible over the neck, -LAN and -thyromegally.  Prom pseudowheeze Cardiovascular:  RRR, Nl S1/S2, -M/R/G. Lungs:  Upper airway wheezes. Abdomen:  Soft, NT, ND and +BS. Musculoskeletal:  -edema and -tenderness, missing finger on the right hand. Skin: Thin but  intact.  Recent Labs Lab 11/12/12 2322 11/13/12 0650 11/14/12 0510  NA 142  --  140  K 4.0  --  3.7  CL 99  --  98  CO2 28  --  32  BUN 29*  --  28*  CREATININE 1.10 1.00 1.09  GLUCOSE 131*  --  87    Recent Labs Lab 11/12/12 2322 11/13/12 0650 11/14/12 0510  HGB 13.4 12.4 10.9*  HCT 42.4 38.4 34.4*  WBC 16.8* 16.2* 14.1*  PLT 274 216 219   Dg Sniff Test  11/13/2012   *RADIOLOGY REPORT*  Clinical Data: Worsening shortness of breath.  SNIFF TEST  Comparison: None.  Findings: 17 seconds of fluoroscopy time were used in conjunction with this test.  There is bilateral and symmetric diaphragmatic excursion.  IMPRESSION: Negative sniff test.   Original Report Authenticated By: Holley Dexter, M.D.   Dg Chest Port 1 View  11/13/2012   *RADIOLOGY REPORT*  Clinical Data: Shortness of breath and chest pain.  PORTABLE CHEST - 1 VIEW  Comparison: Chest radiograph performed 11/06/2012  Findings: The lungs are well-aerated.  Vascular congestion is noted, without definite pulmonary edema.  Mild left basilar airspace opacity may reflect atelectasis or possibly mild pneumonia.  A small left pleural effusion is suspected.  No pneumothorax is seen.  The cardiomediastinal silhouette is borderline normal in size.  A pacemaker is seen overlying the left chest wall, with leads ending overlying the right atrium right ventricle.  Scattered clips are seen about  the left axilla.  No acute osseous abnormalities are seen.  Chronic right-sided rib deformity is noted.  IMPRESSION: Vascular congestion, without definite pulmonary edema.  Mild left basilar airspace opacity may reflect atelectasis or possibly mild pneumonia.  Suspect small left pleural effusion.   Original Report Authenticated By: Tonia Ghent, M.D.   ASSESSMENT / PLAN:  79 year old female with asthma history, ?COPD and VCD.  This is less likely an exacerbation of asthma and more likely a VCD issue.  Patient has been on narcotics and benzos, cough  has been controlled for the most part.  GERD and PND has been addressed as well via PPI and nasal steroids.  Anxiety seems to be the major issue here.  Has been seen [facility] before, last visit 4 yrs ago.  Notes Diaphragm normal on sniff test Note also has R lung airspace dz ?PNA, note WBC and fever curve better   Plan: - f/u spiro -Asthma airway Rx as you are doing -needs ENT reeval as outpt at [facility] I will ask Dr Delton Coombes to f/u this admit 5/29 Agree with ABX as ordered. Note no cultures obtained   Caryl Bis  [phone]  Cell  [phone]  If no response or cell goes to voicemail, call beeper [phone]

## 2012-11-14 NOTE — Plan of Care (Signed)
Problem: Phase I Progression Outcomes Goal: Tolerating diet Outcome: Completed/Met Date Met:  11/14/12 Pt tolerating diet, no c/o n/v

## 2012-11-14 NOTE — Progress Notes (Signed)
New dressing applied to pts abd per wound care instructions, colostomy supplies ordered, Husband stated he would bring in supplies tomorrow

## 2012-11-15 DIAGNOSIS — J309 Allergic rhinitis, unspecified: Secondary | ICD-10-CM

## 2012-11-15 DIAGNOSIS — I517 Cardiomegaly: Secondary | ICD-10-CM

## 2012-11-15 DIAGNOSIS — I5031 Acute diastolic (congestive) heart failure: Secondary | ICD-10-CM

## 2012-11-15 LAB — CBC
HCT: 32.7 % — ABNORMAL LOW (ref 36.0–46.0)
MCH: 27.9 pg (ref 26.0–34.0)
MCHC: 31.5 g/dL (ref 30.0–36.0)
MCV: 88.6 fL (ref 78.0–100.0)
Platelets: 248 10*3/uL (ref 150–400)
RDW: 15.3 % (ref 11.5–15.5)
WBC: 11.7 10*3/uL — ABNORMAL HIGH (ref 4.0–10.5)

## 2012-11-15 LAB — BASIC METABOLIC PANEL
BUN: 35 mg/dL — ABNORMAL HIGH (ref 6–23)
Calcium: 9.9 mg/dL (ref 8.4–10.5)
Chloride: 95 mEq/L — ABNORMAL LOW (ref 96–112)
Creatinine, Ser: 1.2 mg/dL — ABNORMAL HIGH (ref 0.50–1.10)
GFR calc Af Amer: 50 mL/min — ABNORMAL LOW (ref >90)
GFR calc non Af Amer: 43 mL/min — ABNORMAL LOW (ref >90)

## 2012-11-15 MED ORDER — LEVOFLOXACIN 750 MG PO TABS
750.0000 mg | ORAL_TABLET | ORAL | Status: DC
  Administered 2012-11-16: 750 mg via ORAL
  Filled 2012-11-15: qty 1

## 2012-11-15 MED ORDER — PREDNISONE 20 MG PO TABS
30.0000 mg | ORAL_TABLET | Freq: Every day | ORAL | Status: DC
  Administered 2012-11-16: 30 mg via ORAL
  Filled 2012-11-15 (×2): qty 1

## 2012-11-15 NOTE — Progress Notes (Addendum)
D/w Dr. Delton [name]  [facility] PROGRESS NOTE  [name] ZOX:[id number] DOB: [birth year] DOA: 11/12/2012 PCP: Miranda Sauer, MD  Assessment/Plan: ]   Pneumonia: change to PO abx    HYPOTHYROIDISM:  TSH ok    Atrial fibrillation  History systolic heart failure:  Could be contributing to dyspnea.  Weight up 20 lbs since 12/13. Has edema and elevated pBNP.  No echo since 2011 at which time EF was 35%.  Will repeat and change lasix to 40 IV bid. Today's echo shows normal EF. Could be fluid retention from steroids and or diastolic heart failure    Vocal cord dysfunction:  On PPI.  outpt f/u with [facility] ENT.  Patient afraid to go home    Hypoxemia: on home O2    Asthma with acute exacerbation:  No wheeze today.  More pseudowheeze.  Change to po steroids and taper rapidly    Ventral hernia, large, likely contributes to respiratory difficulty    Anxiety state, unspecified seems to play a role, I agree.  Likely discharge in am  Code Status: DNR Family Communication: husband Disposition Plan: home   Consultants:  CCM  Procedures:    Antibiotics:  levaquin  HPI/Subjective: Rough morning, but vague. Feels better.  Multiple questions and concerns.  Orthopnea present.  Sleeps upright. Afraid to go home. C/o congestion  Objective: Filed Vitals:   11/15/12 0908 11/15/12 0932 11/15/12 1451 11/15/12 1534  BP:  122/54 126/57   Pulse:  86 83   Temp:   99 F (37.2 C)   TempSrc:   Oral   Resp:   19   Height:      Weight:      SpO2: 95%   96%    Intake/Output Summary (Last 24 hours) at 11/15/12 1644 Last data filed at 11/15/12 0929  Gross per 24 hour  Intake    483 ml  Output   1100 ml  Net   -617 ml   Filed Weights   11/13/12 0435 11/14/12 0530 11/15/12 0534  Weight: 78.881 kg (173 lb 14.4 oz) 77.384 kg (170 lb 9.6 oz) 77.474 kg (170 lb 12.8 oz)    Exam:   General:   Remains talkative.  Cardiovascular: RRR  Respiratory: Good air movement.  Pseudowheeze.   Some rhonchi  Abdomen: colostomy bag noted.  Large ventral hernia  Ext:  Edema much better, just at ankles  Data Reviewed: Basic Metabolic Panel:  Recent Labs Lab 11/12/12 2322 11/13/12 0650 11/14/12 0510 11/15/12 0822  NA 142  --  140 139  K 4.0  --  3.7 3.6  CL 99  --  98 95*  CO2 28  --  32 31  GLUCOSE 131*  --  87 142*  BUN 29*  --  28* 35*  CREATININE 1.10 1.00 1.09 1.20*  CALCIUM 10.9*  --  10.1 9.9   Liver Function Tests: No results found for this basename: AST, ALT, ALKPHOS, BILITOT, PROT, ALBUMIN,  in the last 168 hours No results found for this basename: LIPASE, AMYLASE,  in the last 168 hours No results found for this basename: AMMONIA,  in the last 168 hours CBC:  Recent Labs Lab 11/12/12 2322 11/13/12 0650 11/14/12 0510 11/15/12 0530  WBC 16.8* 16.2* 14.1* 11.7*  NEUTROABS 15.9*  --   --   --   HGB 13.4 12.4 10.9* 10.3*  HCT 42.4 38.4 34.4* 32.7*  MCV 90.2 88.7 88.9 88.6  PLT 274 216 219 248   Cardiac Enzymes:  Recent Labs Lab 11/12/12 2322  TROPONINI <0.30   BNP (last 3 results)  Recent Labs  12/03/11 0435 11/12/12 2322  PROBNP 2410.0* 1782.0*   Echo Left ventricle: Technically limited study. The cavity size was normal. Wall thickness was increased in a pattern of mild LVH. The estimated ejection fraction was 60%. Regional wall motion abnormalities cannot be excluded. - Aortic valve: Sclerosis without stenosis. No significant regurgitation.  CBG: No results found for this basename: GLUCAP,  in the last 168 hours  Recent Results (from the past 240 hour(s))  MRSA PCR SCREENING     Status: None   Collection Time    11/13/12  4:40 AM      Result Value Range Status   MRSA by PCR NEGATIVE  NEGATIVE Final   Comment:            The GeneXpert MRSA Assay (FDA     approved for NASAL specimens     only), is one component of a     comprehensive MRSA colonization     surveillance program. It is not     intended to diagnose MRSA      infection nor to guide or     monitor treatment for     MRSA infections.     Studies: No results found.  Scheduled Meds: . albuterol  2.5 mg Nebulization TID  . budesonide (PULMICORT) nebulizer solution  0.25 mg Nebulization BID  . DULoxetine  30 mg Oral Daily  . enoxaparin (LOVENOX) injection  40 mg Subcutaneous Q24H  . furosemide  40 mg Intravenous BID  . [START ON 11/16/2012] levofloxacin  500 mg Oral Daily  . levothyroxine  50 mcg Oral QAC breakfast  . losartan  50 mg Oral Daily  . mirtazapine  15 mg Oral QHS  . multivitamin with minerals  1 tablet Oral Daily  . nebivolol  10 mg Oral Daily  . pantoprazole  40 mg Oral Daily  . polyethylene glycol  17 g Oral Daily  . polyvinyl alcohol  2 drop Both Eyes BID  . [START ON 11/16/2012] predniSONE  30 mg Oral Q breakfast  . senna-docusate  2 tablet Oral QHS  . simvastatin  20 mg Oral q1800  . sodium chloride  3 mL Intravenous Q12H  . [START ON 11/18/2012] Vitamin D (Ergocalciferol)  50,000 Units Oral Q7 days  . zafirlukast  20 mg Oral BID  . zolpidem  5 mg Oral QHS   Continuous Infusions:   Time spent: 40 min  [name] L  [facility] Pager [phone]. If 7PM-7AM, please contact night-coverage at [email or web address], password [facility] 11/15/2012, 4:44 PM  LOS: 3 days

## 2012-11-15 NOTE — Progress Notes (Signed)
PULMONARY  / CRITICAL CARE MEDICINE  Name: [name] MRN: [id number] DOB: 1934/03/05    ADMISSION DATE:  11/12/2012 CONSULTATION DATE:  11/13/2012  REFERRING MD :  Gwenlyn Perking PRIMARY SERVICE:  [facility]  CHIEF COMPLAINT:  SOB and VCD  BRIEF PATIENT DESCRIPTION: 79 year old with history of asthma, chronic prednisone (7.5 mg/day) and O2 dependent at 3L [place] at home presenting to the hospital with 2 wks history of worsening SOB due to "attacks" that take 20 minutes to resolve.  Sees [facility]  for VCD and asthma.  Was started on a prednisone dose increase of 20 mg/day and augmentin x14 days that she is almost completed.  PPI has been started years ago and rhinocort for PND.  Interestingly, SOB started 2 years ago after major abdominal surgery for diverticulitis complicated by wound dehiscence and was told at the time that her diaphragm was no longer functional.  Have tried mucinex in the past with little effect.  SIGNIFICANT EVENTS / STUDIES:  5/26 admission for SOB.  LINES / TUBES: PIV  CULTURES: None  ANTIBIOTICS: Levofloxacin 5/26>>>  SUBJECTIVE:  Feels that she is improved  VITAL SIGNS: Temp:  [97.4 F (36.3 C)-98.3 F (36.8 C)] 98.3 F (36.8 C) (05/29 0534) Pulse Rate:  [68-91] 86 (05/29 0932) Resp:  [18-19] 19 (05/29 0534) BP: (122-129)/(52-74) 122/54 mmHg (05/29 0932) SpO2:  [95 %-96 %] 95 % (05/29 0908) FiO2 (%):  [32 %] 32 % (05/28 2043) Weight:  [77.474 kg (170 lb 12.8 oz)] 77.474 kg (170 lb 12.8 oz) (05/29 0534)  PHYSICAL EXAMINATION: General:  Chronically ill appearing, anxious female. Neuro:  Alert and interactive, moving all ext to command. HEENT:  [place]/AT, PERRL, EOM-I and MMM. Neck:  Vibration over the neck with wheezes more audible over the neck, -LAN and -thyromegally.  Prom pseudowheeze Cardiovascular:  RRR, Nl S1/S2, -M/R/G. Lungs:  Upper airway wheezes. Abdomen:  Soft, NT, ND and +BS. Musculoskeletal:  -edema and -tenderness, missing finger on the right  hand. Skin: Thin but intact.  Recent Labs Lab 11/12/12 2322 11/13/12 0650 11/14/12 0510 11/15/12 0822  NA 142  --  140 139  K 4.0  --  3.7 3.6  CL 99  --  98 95*  CO2 28  --  32 31  BUN 29*  --  28* 35*  CREATININE 1.10 1.00 1.09 1.20*  GLUCOSE 131*  --  87 142*    Recent Labs Lab 11/13/12 0650 11/14/12 0510 11/15/12 0530  HGB 12.4 10.9* 10.3*  HCT 38.4 34.4* 32.7*  WBC 16.2* 14.1* 11.7*  PLT 216 219 248   Dg Sniff Test  11/13/2012   *RADIOLOGY REPORT*  Clinical Data: Worsening shortness of breath.  SNIFF TEST  Comparison: None.  Findings: 17 seconds of fluoroscopy time were used in conjunction with this test.  There is bilateral and symmetric diaphragmatic excursion.  IMPRESSION: Negative sniff test.   Original Report Authenticated By: Holley Dexter, M.D.   ASSESSMENT / PLAN:  79 year old female with asthma history, ?COPD and VCD.  This is less likely an exacerbation of asthma and more likely a VCD issue.  Discussion with her today reveals that she hasn't really seen the connection between her current UA problems and her chronic issues (prior botox, etc) although they are clearly different manifestations of the same process. I attempted to explain this today. She needs to be seen by ENT and speech therapy, probably at [facility]. Agree with aggressive treatment of exacerbating factors - GERD and allergic  rhinitis. Agree with BD's and rx for possible asthma component to her presentation.  Consider weaning pred and change abx to PO.  Levy Pupa, MD, PhD 11/15/2012, 12:30 PM [facility] [phone] or if no answer [phone]

## 2012-11-15 NOTE — Evaluation (Signed)
Physical Therapy Evaluation Patient Details Name: [name] MRN: [id number] DOB: [birth year] Today's Date: 11/15/2012 Time: 1308-6578 PT Time Calculation (min): 25 min  PT Assessment / Plan / Recommendation Clinical Impression  [name] is a 79 y/o female with complex medical history admitted with diagnosis of pneumonia.  [place] who is able to assist her with IADLs.  Acute PT to follow pt to promote activity tolerance.  Pt fatigues quickly with miniminal activity.  Suggesting HHPT consult at this time.  Pt may need to consider short term SNF placement if progression is slow developing.  [name]       PT Assessment  Patient needs continued PT services    Follow Up Recommendations  Home health PT;Supervision - Intermittent    Does the patient have the potential to tolerate intense rehabilitation      Barriers to Discharge None      Equipment Recommendations  None recommended by PT    Recommendations for Other Services     Frequency Min 3X/week    Precautions / Restrictions Precautions Precautions: Fall Precaution Comments: Cholostomy, Pt on O2 at home.  Restrictions Weight Bearing Restrictions: No   Pertinent Vitals/Pain No c/o pain.        Mobility  Bed Mobility Bed Mobility: Not assessed Transfers Transfers: Sit to Stand;Stand to Sit Sit to Stand: 5: Supervision;6: Modified independent (Device/Increase time);From chair/3-in-1 Stand to Sit: 5: Supervision;6: Modified independent (Device/Increase time);To chair/3-in-1;With upper extremity assist Details for Transfer Assistance: Supervision for safety first trial . Ambulation/Gait Ambulation/Gait Assistance: 5: Supervision Ambulation Distance (Feet): 15 Feet (15 feet x 2 trials with prolonged rest.) Assistive device: Rolling walker Gait Pattern: Decreased stride length Stairs: No Wheelchair Mobility Wheelchair Mobility: No    Exercises     PT Diagnosis: Generalized weakness  PT  Problem List: Decreased activity tolerance;Decreased mobility;Cardiopulmonary status limiting activity PT Treatment Interventions: Gait training;Therapeutic activities;Functional mobility training;Therapeutic exercise;Neuromuscular re-education;DME instruction;Patient/family education   PT Goals Acute Rehab PT Goals PT Goal Formulation: With patient Time For Goal Achievement: 11/29/12 Potential to Achieve Goals: Good Pt will go Supine/Side to Sit: Independently PT Goal: Supine/Side to Sit - Progress: Goal set today Pt will Ambulate: 51 - 150 feet;with modified independence;with rolling walker PT Goal: Ambulate - Progress: Goal set today  Visit Information  Last PT Received On: 11/15/12    Subjective Data  Subjective: agree to PT eval Patient Stated Goal: I would like to be able to go home.    Prior Functioning  Home Living Lives With: Spouse Available Help at Discharge: Family Type of Home: House Home Access: Level entry Home Layout: One level Bathroom Shower/Tub: Health visitor: Handicapped height Bathroom Accessibility: Yes How Accessible: Accessible via walker Home Adaptive Equipment: Dan Humphreys - four wheeled;Wheelchair - manual;Bedside commode/3-in-1;Raised toilet seat with rails Prior Function Level of Independence: Independent with assistive device(s) Able to Take Stairs?: No Communication Communication: No difficulties Dominant Hand: Left    Cognition  Cognition Arousal/Alertness: Awake/alert Behavior During Therapy: WFL for tasks assessed/performed Overall Cognitive Status: Within Functional Limits for tasks assessed    Extremity/Trunk Assessment Right Lower Extremity Assessment RLE ROM/Strength/Tone: [facility] for tasks assessed Left Lower Extremity Assessment LLE ROM/Strength/Tone: [facility] for tasks assessed Trunk Assessment Trunk Assessment: Normal   Balance Balance Balance Assessed: No  End of Session PT - End of Session Equipment Utilized During  Treatment: Gait belt;Oxygen Activity Tolerance: Patient limited by fatigue Patient left: in chair;with call bell/phone within reach;with family/visitor present Nurse  Communication: Mobility status  GP     [name] 11/15/2012, 3:15 PM [name] L. Chilton Si, DPT   Pager [phone]     Cell [phone]

## 2012-11-15 NOTE — Progress Notes (Signed)
Pt ostomy bag changed today by family member.  New bag is currently clean, dry, and intact. Nino Glow RN

## 2012-11-15 NOTE — Progress Notes (Signed)
*  PRELIMINARY RESULTS* Echocardiogram 2D Echocardiogram has been performed.  Jeryl Columbia 11/15/2012, 11:18 AM

## 2012-11-16 LAB — BASIC METABOLIC PANEL
CO2: 37 mEq/L — ABNORMAL HIGH (ref 19–32)
Calcium: 9.6 mg/dL (ref 8.4–10.5)
Creatinine, Ser: 1.27 mg/dL — ABNORMAL HIGH (ref 0.50–1.10)
GFR calc non Af Amer: 40 mL/min — ABNORMAL LOW (ref >90)
Sodium: 141 mEq/L (ref 135–145)

## 2012-11-16 MED ORDER — POTASSIUM CHLORIDE CRYS ER 10 MEQ PO TBCR: 10.0000 meq | EXTENDED_RELEASE_TABLET | Freq: Every day | ORAL | Status: DC

## 2012-11-16 MED ORDER — PREDNISONE 5 MG PO TABS: ORAL_TABLET | ORAL | Status: DC

## 2012-11-16 MED ORDER — PREDNISONE 5 MG PO TABS: 7.5000 mg | ORAL_TABLET | Freq: Every day | ORAL | Status: DC

## 2012-11-16 MED ORDER — GUAIFENESIN ER 600 MG PO TB12
600.0000 mg | ORAL_TABLET | Freq: Two times a day (BID) | ORAL | Status: DC
  Administered 2012-11-16: 600 mg via ORAL
  Filled 2012-11-16 (×2): qty 1

## 2012-11-16 MED ORDER — FUROSEMIDE 20 MG PO TABS: 20.0000 mg | ORAL_TABLET | Freq: Every day | ORAL | Status: DC

## 2012-11-16 MED ORDER — POTASSIUM CHLORIDE CRYS ER 20 MEQ PO TBCR
40.0000 meq | EXTENDED_RELEASE_TABLET | ORAL | Status: AC
  Administered 2012-11-16 (×2): 40 meq via ORAL
  Filled 2012-11-16 (×3): qty 2

## 2012-11-16 NOTE — Discharge Summary (Signed)
Physician Discharge Summary  [name] ZOX:[id number] DOB: 07-11-1933 DOA: 11/12/2012  PCP: [name], MD  Admit date: 11/12/2012 Discharge date: 11/16/2012  Time spent: greater than 30 min  Recommendations for Outpatient Follow-up:  1. Needs outpatient followup with [facility] ENT for vocal cord dysfunction  Discharge Diagnoses:  Principal Problem:   Pneumonia Active Problems:   HYPOTHYROIDISM   Atrial fibrillation   Acute diastolic heart failure   Vocal cord dysfunction   Hypoxemia   Asthma with acute exacerbation   Ventral hernia   Anxiety state, unspecified  Discharge Condition:  stable  Filed Weights   11/14/12 0530 11/15/12 0534 11/16/12 0615  Weight: 77.384 kg (170 lb 9.6 oz) 77.474 kg (170 lb 12.8 oz) 76.295 kg (168 lb 3.2 oz)    History of present illness:  [name] is a 79 y.o. female with known history of asthma, vocal cord dysfunction, CHF, atrial fibrillation status post pacemaker placement history experiencing episodes where patient gets very short of breath and feels she is choking. This has been ongoing for last 2 weeks. Patient is following with a pulmonologist and asthma specialist. Her pulmonologist had started on oxycodone Ativan for these episodes which per patient was felt to be secondary to vocal cord dysfunction. And her asthma specialist had started her on antibiotics initially Z-Pak and then Augmentin. Patient has been taking a candidate for last 2 weeks. Last 2-3 days her symptoms are worsened and patient also has noticed mild swelling in her lower extremities. In the ER patient had chest x-ray which showed possibility of pneumonia and patient also was mildly hypoxic. Patient has been admitted for further management. Patient denies any chest pain. Has been having some cough which is been chronic. Denies any fever chills. Denies any nausea vomiting abdominal pain or diarrhea.  Hospital Course:  Patient's vital signs were significant for mild  tachycardia on admission. She was comfortable. Lungs clear. Edema noted in both legs. Chest x-ray showed vascular congestion mild left basilar airspace opacity. Patient was started on antibiotics, steroids, oxygen, bronchodilators. Pulmonary was consulted. Patient had a lot of anxiety. She has a history of vocal cord dysfunction and has been followed by various specialists in the past. Her symptoms currently were felt to be due to vocal cord dysfunction, fluid overload and anxiety. She was seen by speech therapy. Patient symptoms improve. She will need followup with ENT a Pap test as well as speech therapy for management of her chronic vocal cord dysfunction. Her weight is up about 20-30 pounds from earlier last year. She was diureses. Echocardiogram showed normal ejection fraction but diastolic dysfunction. By the time of discharge, she had had maximal hospital benefit. Her steroids can be tapered quickly. Total time of day of discharge greater than 30 minutes.  Consultations:  Pulmonary medicine  Discharge Exam: Filed Vitals:   11/15/12 2103 11/16/12 0615 11/16/12 1100 11/16/12 1230  BP:  114/68 139/62   Pulse: 86 70 86   Temp:  98.5 F (36.9 C)    TempSrc:  Oral    Resp: 18 19    Height:      Weight:  76.295 kg (168 lb 3.2 oz)    SpO2:  96%  95%    General: smiling Cardiovascular: RRR without WRR Respiratory: CTA without [facility]  Discharge Instructions  Discharge Orders   Future Appointments Provider Department Dept Phone   11/22/2012 4:15 PM Julio Sicks, NP [facility] [phone]   12/31/2012 1:15 PM Delcie Roch [facility] [phone]   12/31/2012 1:45 PM Myrtis Ser, NP [facility] [phone]   Future Orders Complete By Expires     Activity as tolerated - No restrictions  As directed     Diet - low sodium heart healthy  As directed         Medication List    STOP taking these medications        amoxicillin-clavulanate 875-125 MG per tablet  Commonly known as:  AUGMENTIN     famotidine 20 MG tablet  Commonly known as:  PEPCID      TAKE these medications       albuterol 108 (90 BASE) MCG/ACT inhaler  Commonly known as:  PROVENTIL HFA;VENTOLIN HFA  Inhale 2 puffs into the lungs 2 (two) times daily.     albuterol (2.5 MG/3ML) 0.083% nebulizer solution  Commonly known as:  PROVENTIL  Take 2.5 mg by nebulization every 4 (four) hours as needed for wheezing or shortness of breath.     ALPRAZolam 0.25 MG tablet  Commonly known as:  XANAX  Take 0.25 mg by mouth every 4 (four) hours as needed for anxiety.     beclomethasone 80 MCG/ACT inhaler  Commonly known as:  QVAR  Inhale 3 puffs into the lungs 3 (three) times daily.     chlorpheniramine-HYDROcodone 10-8 MG/5ML Lqcr  Commonly known as:  TUSSIONEX  Take 5 mLs by mouth at bedtime as needed (for cough).     DEXILANT 60 MG capsule  Generic drug:  dexlansoprazole  Take 60 mg by mouth daily before breakfast.     DULoxetine 30 MG capsule  Commonly known as:  CYMBALTA  Take 30 mg by mouth daily.     furosemide 20 MG tablet  Commonly known as:  LASIX  Take 1 tablet (20 mg total) by mouth daily. For fluid retention.     hydroxypropyl methylcellulose 2.5 % ophthalmic solution  Commonly known as:  ISOPTO TEARS  Place 2 drops into both eyes 2 (two) times daily.     levothyroxine 50 MCG tablet  Commonly known as:  SYNTHROID, LEVOTHROID  Take 50 mcg by mouth daily before breakfast.     losartan 50 MG tablet  Commonly known as:  COZAAR  Take 50 mg by mouth daily.     metoprolol succinate 50 MG 24 hr tablet  Commonly known as:  TOPROL-XL  TAKE 1 TABLET BY MOUTH EVERY DAY     mirtazapine 30 MG tablet  Commonly known as:  REMERON  Take 15 mg by mouth at bedtime.     multivitamin with minerals Tabs  Take 1 tablet by mouth daily.     oxyCODONE 5 MG immediate release tablet  Commonly known as:  Oxy IR/ROXICODONE   Take 5-10 mg by mouth every 4 (four) hours as needed (for cough / pain).     polyethylene glycol packet  Commonly known as:  MIRALAX / GLYCOLAX  Take 17 g by mouth daily.     potassium chloride 10 MEQ tablet  Commonly known as:  K-DUR,KLOR-CON  Take 1 tablet (10 mEq total) by mouth daily.     pravastatin 40 MG tablet  Commonly known as:  PRAVACHOL  Take 40 mg by mouth daily.     predniSONE 5 MG tablet  Commonly known as:  DELTASONE  20 mg on 5/31, 10 mg on 6/1, then resume 7.5 mg daily     promethazine 25 MG tablet  Commonly known  as:  PHENERGAN  Take 25 mg by mouth every 6 (six) hours as needed. For nausea     RHINOCORT AQUA NA  Place 1 spray into the nose daily.     senna-docusate 8.6-50 MG per tablet  Commonly known as:  Senokot-S  Take 1 tablet by mouth daily.     Vitamin D (Ergocalciferol) 50000 UNITS Caps  Commonly known as:  DRISDOL  Take 50,000 Units by mouth every 7 (seven) days. On Sundays     zafirlukast 20 MG tablet  Commonly known as:  ACCOLATE  Take 20 mg by mouth 2 (two) times daily.     zolpidem 10 MG tablet  Commonly known as:  AMBIEN  Take 10 mg by mouth at bedtime.       Allergies  Allergen Reactions  . Aspirin Shortness Of Breath    Kicks up your asthma  . Sulfonamide Derivatives Hives  . Ibuprofen Other (See Comments)    Doesn't take because of her aspirin allergy  . Iron Dextran Itching     itching after test dose  . Metoclopramide Hcl Other (See Comments)    REACTION: chomping mouth       Follow-up Information   Follow up with [name], MD. (As needed)    Contact information:   [address] [facility] [place] [address] [phone]       Follow up with [facility]. (Dr. Kavin Leech office will refer you)        The results of significant diagnostics from this hospitalization (including imaging, microbiology, ancillary and laboratory) are listed below for reference.    Significant  Diagnostic Studies: Dg Chest 2 View  11/08/2012   *RADIOLOGY REPORT*  Clinical Data: Shortness of breath, wheezing, cough, hypertension, asthma, COPD, former smoker  CHEST - 2 VIEW  Comparison: 09/06/2038  Findings: Left subclavian transvenous pacemaker leads project at right atrium and right ventricle. Upper normal heart size. Tortuous aorta. Pulmonary vascularity normal. Emphysematous changes consistent with COPD. Calcified granuloma left upper lobe. Pleuroparenchymal thickening at apices stable. Minimal atelectasis versus scarring at bases. No acute infiltrate, pleural effusion, or pneumothorax. Bones appear demineralized with suspect old bilateral rib fractures.  IMPRESSION: Changes of COPD and old granulomatous disease. Chronic bibasilar atelectasis versus scarring and biapical scarring stable. No acute abnormalities.   Original Report Authenticated By: Ulyses Southward, M.D.   Dg Sniff Test  11/13/2012   *RADIOLOGY REPORT*  Clinical Data: Worsening shortness of breath.  SNIFF TEST  Comparison: None.  Findings: 17 seconds of fluoroscopy time were used in conjunction with this test.  There is bilateral and symmetric diaphragmatic excursion.  IMPRESSION: Negative sniff test.   Original Report Authenticated By: Holley Dexter, M.D.   Dg Chest Port 1 View  11/13/2012   *RADIOLOGY REPORT*  Clinical Data: Shortness of breath and chest pain.  PORTABLE CHEST - 1 VIEW  Comparison: Chest radiograph performed 11/06/2012  Findings: The lungs are well-aerated.  Vascular congestion is noted, without definite pulmonary edema.  Mild left basilar airspace opacity may reflect atelectasis or possibly mild pneumonia.  A small left pleural effusion is suspected.  No pneumothorax is seen.  The cardiomediastinal silhouette is borderline normal in size.  A pacemaker is seen overlying the left chest wall, with leads ending overlying the right atrium right ventricle.  Scattered clips are seen about the left axilla.  No acute osseous  abnormalities are seen.  Chronic right-sided rib deformity is noted.  IMPRESSION: Vascular congestion, without definite pulmonary edema.  Mild left basilar airspace opacity may reflect atelectasis or possibly mild pneumonia.  Suspect small left pleural effusion.   Original Report Authenticated By: Tonia Ghent, M.D.    Microbiology: Recent Results (from the past 240 hour(s))  MRSA PCR SCREENING     Status: None   Collection Time    11/13/12  4:40 AM      Result Value Range Status   MRSA by PCR NEGATIVE  NEGATIVE Final   Comment:            The GeneXpert MRSA Assay (FDA     approved for NASAL specimens     only), is one component of a     comprehensive MRSA colonization     surveillance program. It is not     intended to diagnose MRSA     infection nor to guide or     monitor treatment for     MRSA infections.     Labs: Basic Metabolic Panel:  Recent Labs Lab 11/12/12 2322 11/13/12 0650 11/14/12 0510 11/15/12 0822 11/16/12 0645  NA 142  --  140 139 141  K 4.0  --  3.7 3.6 3.1*  CL 99  --  98 95* 96  CO2 28  --  32 31 37*  GLUCOSE 131*  --  87 142* 86  BUN 29*  --  28* 35* 37*  CREATININE 1.10 1.00 1.09 1.20* 1.27*  CALCIUM 10.9*  --  10.1 9.9 9.6   Liver Function Tests: No results found for this basename: AST, ALT, ALKPHOS, BILITOT, PROT, ALBUMIN,  in the last 168 hours No results found for this basename: LIPASE, AMYLASE,  in the last 168 hours No results found for this basename: AMMONIA,  in the last 168 hours CBC:  Recent Labs Lab 11/12/12 2322 11/13/12 0650 11/14/12 0510 11/15/12 0530  WBC 16.8* 16.2* 14.1* 11.7*  NEUTROABS 15.9*  --   --   --   HGB 13.4 12.4 10.9* 10.3*  HCT 42.4 38.4 34.4* 32.7*  MCV 90.2 88.7 88.9 88.6  PLT 274 216 219 248   Cardiac Enzymes:  Recent Labs Lab 11/12/12 2322  TROPONINI <0.30   BNP: BNP (last 3 results)  Recent Labs  12/03/11 0435 11/12/12 2322  PROBNP 2410.0* 1782.0*   CBG: No results found for this  basename: GLUCAP,  in the last 168 hours  Signed:  Annibelle Brazie L  [facility] 11/16/2012, 1:39 PM

## 2012-11-16 NOTE — Progress Notes (Signed)
11/16/12 1430 In to speak with pt. about home health services.  Pt. stated she has had [facility] in past and was satisfied with their services.  In addition, pt. would like a list of private duty providers.  TC to [place], with Genevieve Norlander, to give [facility] RN and [facility] PT.  Gave pt. list of private duty providers.  Pt. to dc home today.  Tera Mater, RN, BSN NCM [phone]

## 2012-11-16 NOTE — Progress Notes (Signed)
Physical Therapy Treatment Patient Details Name: [name] MRN: [id number] DOB: Jan 19, 1934 Today's Date: 11/16/2012 Time: 1200-1229 PT Time Calculation (min): 29 min  PT Assessment / Plan / Recommendation Comments on Treatment Session  Pt is making excellent progress with mobility and Independence. Pt's O2 sats 95% on 2 L o2 after gait. Pt continues to need to focus on endurance with mobility and would benefit from continued PT services.    Follow Up Recommendations  Home health PT;Supervision - Intermittent     Does the patient have the potential to tolerate intense rehabilitation     Barriers to Discharge        Equipment Recommendations  None recommended by PT    Recommendations for Other Services    Frequency     Plan Discharge plan remains appropriate    Precautions / Restrictions Precautions Precautions: Other (comment) Precaution Comments: colostomy bag, SOB with activity   Pertinent Vitals/Pain     Mobility  Bed Mobility Bed Mobility: Not assessed Transfers Transfers: Sit to Stand;Stand to Sit Sit to Stand: 6: Modified independent (Device/Increase time) Stand to Sit: 6: Modified independent (Device/Increase time) Ambulation/Gait Ambulation/Gait Assistance: 5: Supervision Ambulation Distance (Feet): 100 Feet Assistive device: Rolling walker Ambulation/Gait Assistance Details: standing rest breaks, cues to stop talking for energy conservation and breathing, noticably SOB with gait. cues to stay inside RW for improved balance and safety. Gait Pattern: Step-through pattern;Decreased stride length Gait velocity: decreased Stairs: No    Exercises     PT Diagnosis:    PT Problem List:   PT Treatment Interventions:     PT Goals Acute Rehab PT Goals PT Goal: Ambulate - Progress: Progressing toward goal  Visit Information  Last PT Received On: 11/16/12 Assistance Needed: +1    Subjective Data  Subjective: "I had 2 episodes of chocking today"   Cognition  Cognition Arousal/Alertness: Awake/alert Behavior During Therapy: WFL for tasks assessed/performed Overall Cognitive Status: Within Functional Limits for tasks assessed    Balance  Balance Balance Assessed: Yes Static Standing Balance Static Standing - Balance Support: Bilateral upper extremity supported Static Standing - Level of Assistance: 6: Modified independent (Device/Increase time)  End of Session PT - End of Session Equipment Utilized During Treatment: Gait belt;Oxygen Activity Tolerance: Patient limited by fatigue Patient left: in chair;with call bell/phone within reach Nurse Communication: Mobility status   GP     Greggory Stallion 11/16/2012, 12:36 PM

## 2012-11-19 ENCOUNTER — Telehealth: Payer: Self-pay | Admitting: Emergency Medicine

## 2012-11-19 DIAGNOSIS — J383 Other diseases of vocal cords: Secondary | ICD-10-CM

## 2012-11-19 NOTE — Telephone Encounter (Signed)
Referral for [facility]NT was sent to [facility] Spoke with pt's spouse and notified that this was done He verbalized understanding and states nothing further needed

## 2012-11-22 ENCOUNTER — Encounter: Payer: Medicare Other | Admitting: Adult Health

## 2012-12-31 ENCOUNTER — Encounter: Payer: Self-pay | Admitting: Oncology

## 2012-12-31 ENCOUNTER — Other Ambulatory Visit (HOSPITAL_BASED_OUTPATIENT_CLINIC_OR_DEPARTMENT_OTHER): Payer: Medicare Other | Admitting: Lab

## 2012-12-31 ENCOUNTER — Ambulatory Visit (HOSPITAL_BASED_OUTPATIENT_CLINIC_OR_DEPARTMENT_OTHER): Payer: Medicare Other | Admitting: Oncology

## 2012-12-31 VITALS — BP 139/77 | HR 89 | Temp 97.0°F | Resp 18 | Ht 64.0 in | Wt 176.3 lb

## 2012-12-31 DIAGNOSIS — D509 Iron deficiency anemia, unspecified: Secondary | ICD-10-CM

## 2012-12-31 DIAGNOSIS — D500 Iron deficiency anemia secondary to blood loss (chronic): Secondary | ICD-10-CM

## 2012-12-31 DIAGNOSIS — E538 Deficiency of other specified B group vitamins: Secondary | ICD-10-CM

## 2012-12-31 DIAGNOSIS — D518 Other vitamin B12 deficiency anemias: Secondary | ICD-10-CM

## 2012-12-31 LAB — CBC WITH DIFFERENTIAL/PLATELET
BASO%: 0.5 % (ref 0.0–2.0)
Basophils Absolute: 0.1 10e3/uL (ref 0.0–0.1)
EOS%: 4 % (ref 0.0–7.0)
Eosinophils Absolute: 0.6 10e3/uL — ABNORMAL HIGH (ref 0.0–0.5)
HCT: 34.9 % (ref 34.8–46.6)
HGB: 11.4 g/dL — ABNORMAL LOW (ref 11.6–15.9)
LYMPH%: 12.7 % — ABNORMAL LOW (ref 14.0–49.7)
MCH: 27.7 pg (ref 25.1–34.0)
MCHC: 32.7 g/dL (ref 31.5–36.0)
MCV: 84.8 fL (ref 79.5–101.0)
MONO#: 1.5 10e3/uL — ABNORMAL HIGH (ref 0.1–0.9)
MONO%: 9.5 % (ref 0.0–14.0)
NEUT#: 11.5 10e3/uL — ABNORMAL HIGH (ref 1.5–6.5)
NEUT%: 73.3 % (ref 38.4–76.8)
Platelets: 284 10e3/uL (ref 145–400)
RBC: 4.12 10e6/uL (ref 3.70–5.45)
RDW: 15.5 % — ABNORMAL HIGH (ref 11.2–14.5)
WBC: 15.7 10e3/uL — ABNORMAL HIGH (ref 3.9–10.3)
lymph#: 2 10e3/uL (ref 0.9–3.3)

## 2012-12-31 LAB — COMPREHENSIVE METABOLIC PANEL (CC13)
Albumin: 3.7 g/dL (ref 3.5–5.0)
BUN: 23.5 mg/dL (ref 7.0–26.0)
CO2: 25 mEq/L (ref 22–29)
Calcium: 10.3 mg/dL (ref 8.4–10.4)
Chloride: 105 mEq/L (ref 98–109)
Glucose: 99 mg/dl (ref 70–140)
Potassium: 4.7 mEq/L (ref 3.5–5.1)
Total Protein: 6.8 g/dL (ref 6.4–8.3)

## 2012-12-31 LAB — VITAMIN B12: Vitamin B-12: 743 pg/mL (ref 211–911)

## 2012-12-31 LAB — FERRITIN CHCC: Ferritin: 21 ng/ml (ref 9–269)

## 2012-12-31 NOTE — Progress Notes (Signed)
[facility]  [phone] [phone]   OFFICE PROGRESS NOTE   Cc:  [name], MD  DIAGNOSIS: Iron-deficiency anemia presumably from diverculosis and VitB12 deficiency.   PAST THERAPY: IV iron.   CURRENT THERAPY: Watchful observation and repeat IV iron prn severe iron deficiency; VitB12 injection per PCP.  INTERVAL HISTORY: [name] 79 y.o. female returns for regular follow up. She continues to reside at [facility].  She has not had VitB12 injection for a long time.  She has generalized fatigue.  She spends a lot of awake time at rest.  She has mild to moderate SOB and DOE.  She denied chest pain, pedal edema, PND, orthopnea.   Patient denies headache, visual changes, confusion, drenching night sweats, palpable lymph node swelling, mucositis, odynophagia, dysphagia, nausea vomiting, jaundice, productive cough, gum bleeding, epistaxis, hematemesis, hemoptysis, abdominal pain, abdominal swelling, early satiety, melena, hematochezia, hematuria, skin rash, spontaneous bleeding, joint swelling, joint pain, heat or cold intolerance, bowel bladder incontinence, back pain, focal motor weakness, paresthesia, depression, suicidal or homicidal ideation, feeling hopelessness.   Past Medical History  Diagnosis Date  . Insomnia, unspecified   . Osteoarthrosis, unspecified whether generalized or localized, unspecified site   . Unspecified hypothyroidism   . Unspecified essential hypertension   . Atrial fibrillation   . Sciatica   . Anemia   . COPD (chronic obstructive pulmonary disease)   . UTI (urinary tract infection)   . GERD (gastroesophageal reflux disease)   . Pneumonia   . Asthma   . Clostridium difficile colitis   . Diverticulitis of sigmoid colon 11/2010    with abscess/contained perforation  . Dementia     mild  . Dehydration with hyponatremia 12/2010    hospitalized  . Angiodysplasia of stomach     ablated 2010  . Angiodysplasia  of colon     ablated 2010  . Vitamin B12 deficiency (dietary) anemia 04/12/2011  . Shingles   . Sarcoma     right hand  . Malignant neoplasm of breast (female), unspecified site     left  . Dupuytren's disease     left hand  . Vitamin B12 deficiency 04/02/2012  . Complication of anesthesia     " very cautious because of my COPD"  . Pacemaker     Past Surgical History  Procedure Laterality Date  . Nasal sinus surgery      x 3  . Appendectomy    . Hernia repair    . Abdominal hysterectomy    . Pacemaker insertion    . Eye surgery      x6-bilateral  . Finger amputation      right  . Foot surgery      right toes x 3  . Tonsillectomy    . Colonoscopy  01/2009    w/ablation of 3 mm AVM, diverticulosis, external hemorrhoids  . Upper gastrointestinal endoscopy  01/2009    w/polypectomy, AVM, hyperplastic polyp  . Abdominal hysterectomy    . Laparotomy  11/12/2011    Procedure: EXPLORATORY LAPAROTOMY;  Surgeon: Lodema Pilot, DO;  Location: [facility];  Service: General;  Laterality: N/A;  . Colostomy revision  11/12/2011    Procedure: COLON RESECTION SIGMOID;  Surgeon: Lodema Pilot, DO;  Location: [facility];  Service: General;  Laterality: N/A;  . Colostomy  11/12/2011    Procedure: COLOSTOMY;  Surgeon: Lodema Pilot, DO;  Location: [facility];  Service: General;  Laterality: Left;  End colostomy  . Laparotomy  11/19/2011  Procedure: EXPLORATORY LAPAROTOMY;  Surgeon: Kandis Cocking, MD;  Location: [facility];  Service: General;  Laterality: N/A;  . Gastrostomy  11/19/2011    Procedure: GASTROSTOMY;  Surgeon: Kandis Cocking, MD;  Location: [facility];  Service: General;;  INSERTION GASTROSTOMY TUBE  . Application of wound vac  11/19/2011    Procedure: APPLICATION OF WOUND VAC;  Surgeon: Kandis Cocking, MD;  Location: [facility];  Service: General;  Laterality: N/A;    Current Outpatient Prescriptions  Medication Sig Dispense Refill  . baclofen (LIORESAL) 10 MG tablet Take 10 mg by mouth every 8 (eight)  hours as needed.      [name] EPINEPHrine (EPIPEN IJ) Inject as directed.      . ondansetron (ZOFRAN) 4 MG tablet Take 4 mg by mouth every 6 (six) hours as needed for nausea.      [name] albuterol (PROVENTIL HFA;VENTOLIN HFA) 108 (90 BASE) MCG/ACT inhaler Inhale 2 puffs into the lungs 2 (two) times daily.      [name] albuterol (PROVENTIL) (2.5 MG/3ML) 0.083% nebulizer solution Take 2.5 mg by nebulization every 4 (four) hours as needed for wheezing or shortness of breath.      . ALPRAZolam (XANAX) 0.25 MG tablet Take 0.25 mg by mouth every 4 (four) hours as needed for anxiety.      . beclomethasone (QVAR) 80 MCG/ACT inhaler Inhale 3 puffs into the lungs 3 (three) times daily.       . Budesonide (RHINOCORT AQUA NA) Place 1 spray into the nose daily.       . chlorpheniramine-HYDROcodone (TUSSIONEX) 10-8 MG/5ML LQCR Take 5 mLs by mouth at bedtime as needed (for cough).      [name] dexlansoprazole (DEXILANT) 60 MG capsule Take 60 mg by mouth daily before breakfast.      . DULoxetine (CYMBALTA) 30 MG capsule Take 30 mg by mouth daily.      . furosemide (LASIX) 20 MG tablet Take 1 tablet (20 mg total) by mouth daily. For fluid retention.  30 tablet  0  . hydroxypropyl methylcellulose (ISOPTO TEARS) 2.5 % ophthalmic solution Place 2 drops into both eyes 2 (two) times daily.      [name] levothyroxine (SYNTHROID, LEVOTHROID) 50 MCG tablet Take 50 mcg by mouth daily before breakfast.       . losartan (COZAAR) 50 MG tablet Take 50 mg by mouth daily.      . metoprolol succinate (TOPROL-XL) 50 MG 24 hr tablet TAKE 1 TABLET BY MOUTH EVERY DAY  90 tablet  3  . mirtazapine (REMERON) 30 MG tablet Take 15 mg by mouth at bedtime.       . Multiple Vitamin (MULTIVITAMIN WITH MINERALS) TABS Take 1 tablet by mouth daily.      [name] oxyCODONE (OXY IR/ROXICODONE) 5 MG immediate release tablet Take 5-10 mg by mouth every 4 (four) hours as needed (for cough / pain).       . polyethylene glycol (MIRALAX / GLYCOLAX) packet Take 17 g by mouth daily.      .  potassium chloride SA (K-DUR,KLOR-CON) 10 MEQ tablet Take 1 tablet (10 mEq total) by mouth daily.  30 tablet  0  . pravastatin (PRAVACHOL) 40 MG tablet Take 40 mg by mouth daily.      . predniSONE (DELTASONE) 5 MG tablet Take 7.5 mg by mouth daily.      . promethazine (PHENERGAN) 25 MG tablet Take 25 mg by mouth every 6 (six) hours as needed. For nausea      .  senna-docusate (SENOKOT-S) 8.6-50 MG per tablet Take 1 tablet by mouth daily.      . Vitamin D, Ergocalciferol, (DRISDOL) 50000 UNITS CAPS Take 50,000 Units by mouth every 7 (seven) days. On Sundays      . zafirlukast (ACCOLATE) 20 MG tablet Take 20 mg by mouth 2 (two) times daily.      [name] zolpidem (AMBIEN) 10 MG tablet Take 10 mg by mouth at bedtime.        No current facility-administered medications for this visit.    ALLERGIES:  is allergic to aspirin; sulfonamide derivatives; ibuprofen; iron dextran; and metoclopramide hcl.  REVIEW OF SYSTEMS:  The rest of the 14-point review of system was negative.   Filed Vitals:   12/31/12 1357  BP: 139/77  Pulse: 89  Temp: 97 F (36.1 C)  Resp: 18   Wt Readings from Last 3 Encounters:  12/31/12 176 lb 4.8 oz (79.969 kg)  11/16/12 168 lb 3.2 oz (76.295 kg)  07/05/12 153 lb 6.4 oz (69.582 kg)   ECOG Performance status:  2  PHYSICAL EXAMINATION:   General:  well-nourished woman, in no acute distress.  Eyes:  no scleral icterus.  ENT:  There were no oropharyngeal lesions.  Neck was without thyromegaly.  Lymphatics:  Negative cervical, supraclavicular or axillary adenopathy.  Respiratory: lungs were clear bilaterally without wheezing or crackles.  Cardiovascular:  Regular rate and rhythm, S1/S2, without murmur, rub or gallop.  There was no pedal edema.  GI:  abdomen was soft, flat, nontender, nondistended, without organomegaly.  Colostomy in place.  There was mid vertical abdominal wound in dressing.  She preferred that I did not open the packing.  Muscoloskeletal:  no spinal tenderness of  palpation of vertebral spine.  Skin exam was without echymosis, petichae.  Neuro exam was nonfocal.  Patient was able to get on and off exam table with minimal assistance.  Gait was normal using a walker.  Patient was alerted and oriented.  Attention was good.   Language was appropriate.  Mood was normal without depression.  Speech was not pressured.  Thought content was not tangential.     LABORATORY/RADIOLOGY DATA:  Lab Results  Component Value Date   WBC 15.7* 12/31/2012   HGB 11.4* 12/31/2012   HCT 34.9 12/31/2012   PLT 284 12/31/2012   GLUCOSE 99 12/31/2012   CHOL 132 11/28/2011   TRIG 122 11/28/2011   ALKPHOS 84 12/31/2012   ALT 23 12/31/2012   AST 25 12/31/2012   NA 142 12/31/2012   K 4.7 12/31/2012   CL 96 11/16/2012   CREATININE 1.2* 12/31/2012   BUN 23.5 12/31/2012   CO2 25 12/31/2012   INR 1.01 12/05/2011     ASSESSMENT AND PLAN:  1. Anemia: from VitB12 deficiency and iron deficiency from chronic diverticular bleed. Hemoglobin remains stable. Her iron studies and vitamin B12 levels are pending. She is no longer receiving a vitamin B12 injection. We will await her vitamin B12 level is pending today before making further recommendations. If her ferritin is low again, I will consider repeating IV Feraheme.  She could not tolerate oral iron in the past due to constipation, abdominal pain and would not try it again.   2. History of breast cancer. She was supposed to have repeat mammogram by 02/2012.  She would like to arrange this herself.  3. History of sarcoma. In remission at this time.  4. Chronic obstructive pulmonary disease. She is on inhalers and O2.  5. Allergies. She is  on prednisone and montelukast.  6. Congestive heart failure. She is on Lasix, losartan, and metoprolol per cardiologist. She is euvolumic on exam today.  7. Atrial fibrillation. She is on Coumadin per cardiologist, and rate control with metoprolol.  8. Hyperlipidemia. She is on pravastatin. 9. Hypothyroidism. She is  on levothyroxine per PCP.  10. Chronic low-back pain from degenerative joint disease on chronic Vicodin. 11. Diverticulosis with abscess; s/p colectomy.   12. Follow up: Lab in 3 and 6 months. Return visit in about 9 months.     The length of time of the face-to-face encounter was 15  minutes. More than 50% of time was spent counseling and coordination of care.

## 2013-01-01 ENCOUNTER — Telehealth: Payer: Self-pay | Admitting: Oncology

## 2013-01-01 NOTE — Telephone Encounter (Signed)
lvm for pt regarding to all appts....amiled pt appt sched and avs with letter

## 2013-01-23 ENCOUNTER — Other Ambulatory Visit: Payer: Self-pay

## 2013-02-01 ENCOUNTER — Encounter (INDEPENDENT_AMBULATORY_CARE_PROVIDER_SITE_OTHER): Payer: Self-pay | Admitting: General Surgery

## 2013-02-04 ENCOUNTER — Telehealth (INDEPENDENT_AMBULATORY_CARE_PROVIDER_SITE_OTHER): Payer: Self-pay

## 2013-02-04 NOTE — Telephone Encounter (Signed)
Called and spoke to patient regarding recent message rec'd from [place].  Patient reports that her stoma is very red and has been enlarged a great deal.  Patient states in Mychart message that "it appears to be growing to a point almost bigger than available supplies"  Patient offered appointment this week with Dr. Biagio Quint (02/06/13) patient declined and req'd one for next week.  Patient has been scheduled for 02/13/13 @ 2:00 pm w/Dr. Biagio Quint.  Reviewed with Dr. Biagio Quint and was advised that if available the patient can have her Ostomy Nurse assess the are of concern.  Patient states she does not live at home and she lives at [facility].  Patient said that one of the nurses where she's living took at look at her Ostomy and reports that it looks healthy.  Patient advised if she develops nausea/vomiting to contact our office or report to the [facility] emergency department for further assessment.  Patient advised to continue with appointment on 02/13/13 verbalized understanding.

## 2013-02-08 ENCOUNTER — Ambulatory Visit (INDEPENDENT_AMBULATORY_CARE_PROVIDER_SITE_OTHER): Payer: Medicare Other | Admitting: General Surgery

## 2013-02-13 ENCOUNTER — Encounter (INDEPENDENT_AMBULATORY_CARE_PROVIDER_SITE_OTHER): Payer: Self-pay | Admitting: General Surgery

## 2013-02-13 ENCOUNTER — Ambulatory Visit (INDEPENDENT_AMBULATORY_CARE_PROVIDER_SITE_OTHER): Payer: Medicare Other | Admitting: General Surgery

## 2013-02-13 VITALS — BP 128/72 | HR 116 | Temp 100.6°F | Resp 20 | Ht 64.0 in | Wt 167.0 lb

## 2013-02-13 DIAGNOSIS — K435 Parastomal hernia without obstruction or  gangrene: Secondary | ICD-10-CM

## 2013-02-13 DIAGNOSIS — K469 Unspecified abdominal hernia without obstruction or gangrene: Secondary | ICD-10-CM

## 2013-02-13 NOTE — Progress Notes (Signed)
Subjective:     Patient ID: [name], female   DOB: 1933/11/24, 79 y.o.   MRN: [id number]  HPI This patient follows up for evaluation of a bulge around her left lower quadrant colostomy. She has a history of sigmoid colectomy for perforated diverticulitis requiring colostomy. She is steroid dependent and had several complications after her initial surgery with her wound. Her midline wound has since healed although she has a large ventral hernia and she says that she has some prolapse of her colostomy as well as some bulging around her colostomy. She says that she is having to use larger and larger wafers. She is not having any leakage.  Review of Systems     Objective:   Physical Exam She has a large midline ventral hernia with apparent loss of domain. She also has a moderate sized parastomal hernia to the upper and outer aspect of her colostomy. She also has some prolapse of her colostomy but the skin appears healthy and there is a good seal    Assessment:     Parastomal hernia Ventral hernia She has a large ventral hernia with loss of domain I think that this is the likelihood of incarceration or strangulation. She is asymptomatic from this and I would not recommend any surgery for this. Just as a moderate-sized parastomal hernia and some prolapse of her colon but I would also not recommend any surgery for this unless she begins having problems sealing her wafer around her stoma or any leakage or obstruction or pain. She remains on high doses of steroids and I think that any procedure would be very high risk for complication and since she is really asymptomatic from this I think that observation would be the best course.     Plan:     Continue with ostomy care and I would not recommend any surgical treatment for this unless her ostomy care becomes too difficult or she has signs of obstruction.

## 2013-02-14 ENCOUNTER — Telehealth: Payer: Self-pay | Admitting: Internal Medicine

## 2013-02-19 NOTE — Telephone Encounter (Signed)
Spoke w/pt---informed pt that carelink transmitter could be brought back to office or call [employer] and have packing slip sent to mail back.

## 2013-02-19 NOTE — Telephone Encounter (Signed)
New Message   Pt states she still has the metronic care link instrument and wants to know if she should turn in.

## 2013-03-04 ENCOUNTER — Telehealth (INDEPENDENT_AMBULATORY_CARE_PROVIDER_SITE_OTHER): Payer: Self-pay

## 2013-03-04 NOTE — Telephone Encounter (Signed)
Pt called stating that her husband has passed.  Pt's husband use to lift pt's walker.  Pt wanting to know if it is OK for her to lift walker into the trunk of her car.  I left pt know that the general rule is 15-20#.  Pt stated that this is "Not a general surgery" and pt "Wants Dr. Delice Lesch opinion on this".  Let pt know that I would send message to Dr. Biagio Quint and Neysa Bonito.  Please call at [phone]

## 2013-03-07 NOTE — Telephone Encounter (Signed)
Patient called back regarding whether or not she can lift her sit down walker into her car.  Patient states she wants a yes or no directly from Dr. Biagio Quint.  Patient states the walker is light weight but unable to state how much it weighs.  Explained to patient that Dr. Biagio Quint is unavailable today however will hopefully be in the office tomorrow.  Explained that as soon as I can get an answer from him we will give her a call back.  Patient states understanding and agreeable at this time.

## 2013-03-08 NOTE — Telephone Encounter (Signed)
Called and left message for patient to call our office back.  Please make patient aware:   Per Dr. Biagio Quint patient can do whatever she feel's like doing.  She has a hernia but, shouldn't hurt anything. Yes, she can lift her walker into her car

## 2013-03-21 ENCOUNTER — Other Ambulatory Visit: Payer: Self-pay

## 2013-03-21 DIAGNOSIS — Z9889 Other specified postprocedural states: Secondary | ICD-10-CM

## 2013-03-21 DIAGNOSIS — Z1231 Encounter for screening mammogram for malignant neoplasm of breast: Secondary | ICD-10-CM

## 2013-03-21 DIAGNOSIS — Z853 Personal history of malignant neoplasm of breast: Secondary | ICD-10-CM

## 2013-03-26 ENCOUNTER — Other Ambulatory Visit (HOSPITAL_BASED_OUTPATIENT_CLINIC_OR_DEPARTMENT_OTHER): Payer: Medicare Other | Admitting: Lab

## 2013-03-26 DIAGNOSIS — D509 Iron deficiency anemia, unspecified: Secondary | ICD-10-CM

## 2013-03-26 DIAGNOSIS — D518 Other vitamin B12 deficiency anemias: Secondary | ICD-10-CM

## 2013-03-26 LAB — CBC WITH DIFFERENTIAL/PLATELET
BASO%: 0.4 % (ref 0.0–2.0)
EOS%: 3.3 % (ref 0.0–7.0)
LYMPH%: 13.5 % — ABNORMAL LOW (ref 14.0–49.7)
MCH: 25.1 pg (ref 25.1–34.0)
MCHC: 31.6 g/dL (ref 31.5–36.0)
MONO#: 1.1 10*3/uL — ABNORMAL HIGH (ref 0.1–0.9)
RBC: 4.19 10*6/uL (ref 3.70–5.45)
WBC: 12.4 10*3/uL — ABNORMAL HIGH (ref 3.9–10.3)
lymph#: 1.7 10*3/uL (ref 0.9–3.3)

## 2013-03-29 ENCOUNTER — Telehealth: Payer: Self-pay | Admitting: *Deleted

## 2013-03-29 NOTE — Telephone Encounter (Signed)
Message copied by Wende Mott on Fri Mar 29, 2013  5:09 PM ------      Message from: Myrtis Ser      Created: Fri Mar 29, 2013  1:17 PM       Call pt. Hgb down slightly, but overall stable. Continue observation. ------

## 2013-03-29 NOTE — Telephone Encounter (Signed)
Unable to reach pt at home or cell number listed.  No identifying VM, unable to leave message.

## 2013-04-01 ENCOUNTER — Telehealth: Payer: Self-pay | Admitting: *Deleted

## 2013-04-01 NOTE — Telephone Encounter (Signed)
Pt returned call from last week. Informed her that according to Clenton Pare, NP: hgb slightly low, but stable. Continue labs as scheduled

## 2013-04-02 ENCOUNTER — Telehealth: Payer: Self-pay | Admitting: *Deleted

## 2013-04-02 NOTE — Telephone Encounter (Signed)
Labs faxed per patient request to [facility] and her PCP Dr Felipa Eth at [facility]

## 2013-04-03 ENCOUNTER — Encounter: Payer: Self-pay | Admitting: *Deleted

## 2013-04-25 ENCOUNTER — Other Ambulatory Visit: Payer: Self-pay

## 2013-05-06 ENCOUNTER — Other Ambulatory Visit: Payer: Self-pay | Admitting: Hematology and Oncology

## 2013-05-06 ENCOUNTER — Other Ambulatory Visit: Payer: Self-pay | Admitting: Oncology

## 2013-05-06 ENCOUNTER — Telehealth: Payer: Self-pay | Admitting: *Deleted

## 2013-05-06 ENCOUNTER — Telehealth: Payer: Self-pay | Admitting: Hematology and Oncology

## 2013-05-06 DIAGNOSIS — D649 Anemia, unspecified: Secondary | ICD-10-CM

## 2013-05-06 NOTE — Telephone Encounter (Signed)
Pt states her PCP instructed her to call us for appt as her Hgb has dropped to 9.1.  Copy of CBC received by fax and forwarded to Dr. Bertis Ruddy for review.

## 2013-05-06 NOTE — Telephone Encounter (Signed)
lmonvm for pt on both home/cell re appt for 11/21 and mailed schedule.

## 2013-05-07 ENCOUNTER — Telehealth: Payer: Self-pay | Admitting: Hematology and Oncology

## 2013-05-07 ENCOUNTER — Other Ambulatory Visit: Payer: Self-pay | Admitting: Hematology and Oncology

## 2013-05-07 ENCOUNTER — Other Ambulatory Visit: Payer: Self-pay | Admitting: *Deleted

## 2013-05-07 DIAGNOSIS — D500 Iron deficiency anemia secondary to blood loss (chronic): Secondary | ICD-10-CM

## 2013-05-07 NOTE — Telephone Encounter (Signed)
I just placed orders for labs before appt Thanks

## 2013-05-07 NOTE — Telephone Encounter (Signed)
Informed pt of appt for Friday at 11:30 am.  Pt states cannot make it at this time due to [facility] appt.  She will try to change attorney appt if needed, but asks if there is another time she can see Dr. Bertis Ruddy?

## 2013-05-07 NOTE — Telephone Encounter (Signed)
Left VM for pt informing her of lab appt added at 3 pm Thursday before MD visit.

## 2013-05-07 NOTE — Telephone Encounter (Signed)
Informed pt that Dr. Bertis Ruddy can see her on Thursday this week at 3:30 pm.  She agreed and thanked [place] for changing her appt date/time to accommodate her other appts.

## 2013-05-07 NOTE — Telephone Encounter (Signed)
Moved 11/21 appt to 11/20 @ 3:30pm w/NG per 11/18 pof. Per pof pt aware.

## 2013-05-07 NOTE — Telephone Encounter (Signed)
added lb to 11/20 f/u. s/w pt she is aware. lb added per 2nd 11/18 pof

## 2013-05-09 ENCOUNTER — Ambulatory Visit (HOSPITAL_BASED_OUTPATIENT_CLINIC_OR_DEPARTMENT_OTHER): Payer: Medicare Other | Admitting: Hematology and Oncology

## 2013-05-09 ENCOUNTER — Encounter: Payer: Self-pay | Admitting: Hematology and Oncology

## 2013-05-09 ENCOUNTER — Telehealth: Payer: Self-pay | Admitting: Hematology and Oncology

## 2013-05-09 ENCOUNTER — Other Ambulatory Visit (HOSPITAL_BASED_OUTPATIENT_CLINIC_OR_DEPARTMENT_OTHER): Payer: Medicare Other | Admitting: Lab

## 2013-05-09 VITALS — BP 155/69 | HR 100 | Temp 97.1°F | Resp 18 | Ht 64.0 in | Wt 172.8 lb

## 2013-05-09 DIAGNOSIS — D509 Iron deficiency anemia, unspecified: Secondary | ICD-10-CM

## 2013-05-09 DIAGNOSIS — L8990 Pressure ulcer of unspecified site, unspecified stage: Secondary | ICD-10-CM

## 2013-05-09 DIAGNOSIS — K922 Gastrointestinal hemorrhage, unspecified: Secondary | ICD-10-CM

## 2013-05-09 DIAGNOSIS — D500 Iron deficiency anemia secondary to blood loss (chronic): Secondary | ICD-10-CM

## 2013-05-09 DIAGNOSIS — L89899 Pressure ulcer of other site, unspecified stage: Secondary | ICD-10-CM

## 2013-05-09 LAB — COMPREHENSIVE METABOLIC PANEL (CC13)
ALT: 21 U/L (ref 0–55)
AST: 28 U/L (ref 5–34)
Albumin: 3.7 g/dL (ref 3.5–5.0)
Anion Gap: 9 mEq/L (ref 3–11)
CO2: 24 mEq/L (ref 22–29)
Calcium: 10.4 mg/dL (ref 8.4–10.4)
Chloride: 108 mEq/L (ref 98–109)
Creatinine: 1 mg/dL (ref 0.6–1.1)
Potassium: 4.6 mEq/L (ref 3.5–5.1)
Total Protein: 6.7 g/dL (ref 6.4–8.3)

## 2013-05-09 LAB — RETICULOCYTES (CHCC)
ABS Retic: 49.1 10*3/uL (ref 19.0–186.0)
RBC.: 3.78 MIL/uL — ABNORMAL LOW (ref 3.87–5.11)
Retic Ct Pct: 1.3 % (ref 0.4–2.3)

## 2013-05-09 LAB — CBC & DIFF AND RETIC
BASO%: 0.4 % (ref 0.0–2.0)
EOS%: 0.9 % (ref 0.0–7.0)
Eosinophils Absolute: 0.1 10*3/uL (ref 0.0–0.5)
HCT: 30.1 % — ABNORMAL LOW (ref 34.8–46.6)
HGB: 9.7 g/dL — ABNORMAL LOW (ref 11.6–15.9)
MCH: 25.2 pg (ref 25.1–34.0)
MCHC: 32.2 g/dL (ref 31.5–36.0)
MONO#: 0.4 10*3/uL (ref 0.1–0.9)
NEUT#: 8.3 10*3/uL — ABNORMAL HIGH (ref 1.5–6.5)
NEUT%: 85.4 % — ABNORMAL HIGH (ref 38.4–76.8)
RDW: 16.2 % — ABNORMAL HIGH (ref 11.2–14.5)
WBC: 9.7 10*3/uL (ref 3.9–10.3)
lymph#: 0.9 10*3/uL (ref 0.9–3.3)

## 2013-05-09 NOTE — Progress Notes (Signed)
[facility] OFFICE PROGRESS NOTE  Miranda Sauer, MD DIAGNOSIS:  History of iron deficiency and B12 deficiency anemia  SUMMARY OF HEMATOLOGIC HISTORY: This is a pleasant 79 year old lady with multiple bowel surgery, colostomy, history of AVM he had diagnosis of iron deficiency anemia and B12 deficiency. She had received B12 injection in the past. She received intravenous iron dextran in the past with an allergic reaction. She denies any recent blood transfusion INTERVAL HISTORY: [name] 79 y.o. female returns for urgent followup. Should periodic bloodwork done in the past few months and was noted to have worsening anemia. On the most recent blood work, she was also noted to have microcytosis. The patient has noted chronic oozing of blood from the colostomy site. She say she is developing her large parastomal hernia and her surgeon cannot do any further surgery. At the site where she place on the colostomy bag, she have a pressure sore that lose blood on a regular basis. The patient denies any recent signs or symptoms of bleeding such as spontaneous epistaxis, hematuria or hematochezia. She complained of fatigue. Denies any worsening shortness of breath or chest pain on exertion. Should the patient is not taking any oral iron supplements. She denies any pica.  I have reviewed the past medical history, past surgical history, social history and family history with the patient and they are unchanged from previous note.  ALLERGIES:  is allergic to aspirin; sulfonamide derivatives; ibuprofen; iron dextran; and metoclopramide hcl.  MEDICATIONS:  Current Outpatient Prescriptions  Medication Sig Dispense Refill  . albuterol (PROVENTIL HFA;VENTOLIN HFA) 108 (90 BASE) MCG/ACT inhaler Inhale 2 puffs into the lungs 2 (two) times daily.      [name] albuterol (PROVENTIL) (2.5 MG/3ML) 0.083% nebulizer solution Take 2.5 mg by nebulization every 4 (four) hours as needed for wheezing or  shortness of breath.      . ALPRAZolam (XANAX) 0.25 MG tablet Take 0.25 mg by mouth every 4 (four) hours as needed for anxiety.      . beclomethasone (QVAR) 80 MCG/ACT inhaler Inhale 3 puffs into the lungs 3 (three) times daily.       . Budesonide (RHINOCORT AQUA NA) Place 1 spray into the nose daily.       . chlorpheniramine-HYDROcodone (TUSSIONEX) 10-8 MG/5ML LQCR Take 5 mLs by mouth at bedtime as needed (for cough).      [name] dexlansoprazole (DEXILANT) 60 MG capsule Take 60 mg by mouth daily before breakfast.      . DULoxetine (CYMBALTA) 30 MG capsule Take 30 mg by mouth daily.      [name] EPINEPHrine (EPIPEN IJ) Inject as directed.      [name] ESZOPICLONE 3 MG tablet       . furosemide (LASIX) 20 MG tablet Take 1 tablet (20 mg total) by mouth daily. For fluid retention.  30 tablet  0  . losartan (COZAAR) 50 MG tablet Take 50 mg by mouth daily.      . metoprolol succinate (TOPROL-XL) 50 MG 24 hr tablet TAKE 1 TABLET BY MOUTH EVERY DAY  90 tablet  3  . mirtazapine (REMERON) 30 MG tablet Take 15 mg by mouth at bedtime.       . Multiple Vitamin (MULTIVITAMIN WITH MINERALS) TABS Take 1 tablet by mouth daily.      [name] oxyCODONE (OXY IR/ROXICODONE) 5 MG immediate release tablet Take 5-10 mg by mouth every 4 (four) hours as needed (for cough / pain).       . polyethylene glycol (  MIRALAX / GLYCOLAX) packet Take 17 g by mouth daily.      . pravastatin (PRAVACHOL) 40 MG tablet Take 40 mg by mouth daily.      . predniSONE (DELTASONE) 5 MG tablet Take 7.5 mg by mouth daily.      . promethazine (PHENERGAN) 25 MG tablet Take 25 mg by mouth every 6 (six) hours as needed. For nausea      . ranitidine (ZANTAC) 300 MG tablet       . senna-docusate (SENOKOT-S) 8.6-50 MG per tablet Take 1 tablet by mouth daily.      . Vitamin D, Ergocalciferol, (DRISDOL) 50000 UNITS CAPS Take 50,000 Units by mouth every 7 (seven) days. On Sundays      . XOLAIR 150 MG injection       . zafirlukast (ACCOLATE) 20 MG tablet Take 20 mg by mouth 2  (two) times daily.      . baclofen (LIORESAL) 10 MG tablet Take 10 mg by mouth every 8 (eight) hours as needed.      . hydroxypropyl methylcellulose (ISOPTO TEARS) 2.5 % ophthalmic solution Place 2 drops into both eyes 2 (two) times daily.      [name] levothyroxine (SYNTHROID, LEVOTHROID) 50 MCG tablet Take 50 mcg by mouth daily before breakfast.       . ondansetron (ZOFRAN) 4 MG tablet Take 4 mg by mouth every 6 (six) hours as needed for nausea.      . potassium chloride SA (K-DUR,KLOR-CON) 10 MEQ tablet Take 1 tablet (10 mEq total) by mouth daily.  30 tablet  0   No current facility-administered medications for this visit.     REVIEW OF SYSTEMS:   Constitutional: Denies fevers, chills or night sweats Eyes: Denies blurriness of vision Ears, nose, mouth, throat, and face: Denies mucositis or sore throat Respiratory: Denies cough, dyspnea or wheezes Cardiovascular: Denies palpitation, chest discomfort or lower extremity swelling Gastrointestinal:  Denies nausea, heartburn or change in bowel habits Skin: Denies abnormal skin rashes Lymphatics: Denies new lymphadenopathy or easy bruising Neurological:Denies numbness, tingling or new weaknesses Behavioral/Psych: Mood is stable, no new changes  All other systems were reviewed with the patient and are negative.  PHYSICAL EXAMINATION: ECOG PERFORMANCE STATUS: 2 - Symptomatic, <50% confined to bed  Filed Vitals:   05/09/13 1538  BP: 155/69  Pulse: 100  Temp: 97.1 F (36.2 C)  Resp: 18   Filed Weights   05/09/13 1538  Weight: 172 lb 12.8 oz (78.382 kg)    GENERAL:alert, no distress and comfortable. The patient is moderately obese and look frail SKIN: skin color, texture, turgor are normal, no rashes or significant lesions. She has been skin EYES: normal, Conjunctiva are pink and non-injected, sclera clear OROPHARYNX:no exudate, no erythema and lips, buccal mucosa, and tongue normal  NECK: supple, thyroid normal size, non-tender, without  nodularity LYMPH:  no palpable lymphadenopathy in the cervical, axillary or inguinal LUNGS: clear to auscultation and percussion with normal breathing effort HEART: regular rate & rhythm and no murmurs and no lower extremity edema ABDOMEN:abdomen soft, non-tender. Noted significant scarring from prior surgeries. Large parastomal hernia is noted Musculoskeletal:no cyanosis of digits and no clubbing  NEURO: alert & oriented x 3 with fluent speech, no focal motor/sensory deficits  LABORATORY DATA:  I have reviewed the data as listed Results for orders placed in visit on 05/09/13 (from the past 48 hour(s))  CBC & DIFF AND RETIC     Status: Abnormal   Collection Time  05/09/13  3:13 PM      Result Value Range   WBC 9.7  3.9 - 10.3 10e3/uL   NEUT# 8.3 (*) 1.5 - 6.5 10e3/uL   HGB 9.7 (*) 11.6 - 15.9 g/dL   HCT 16.1 (*) 09.6 - 04.5 %   Platelets 240  145 - 400 10e3/uL   MCV 78.4 (*) 79.5 - 101.0 fL   MCH 25.2  25.1 - 34.0 pg   MCHC 32.2  31.5 - 36.0 g/dL   RBC 4.09  8.11 - 9.14 10e6/uL   RDW 16.2 (*) 11.2 - 14.5 %   lymph# 0.9  0.9 - 3.3 10e3/uL   MONO# 0.4  0.1 - 0.9 10e3/uL   Eosinophils Absolute 0.1  0.0 - 0.5 10e3/uL   Basophils Absolute 0.0  0.0 - 0.1 10e3/uL   NEUT% 85.4 (*) 38.4 - 76.8 %   LYMPH% 9.1 (*) 14.0 - 49.7 %   MONO% 4.2  0.0 - 14.0 %   EOS% 0.9  0.0 - 7.0 %   BASO% 0.4  0.0 - 2.0 %  COMPREHENSIVE METABOLIC PANEL ([facility]13)     Status: Abnormal   Collection Time    05/09/13  3:13 PM      Result Value Range   Sodium 141  136 - 145 mEq/L   Potassium 4.6  3.5 - 5.1 mEq/L   Chloride 108  98 - 109 mEq/L   CO2 24  22 - 29 mEq/L   Glucose 101  70 - 140 mg/dl   BUN 78.2 (*) 7.0 - 95.6 mg/dL   Creatinine 1.0  0.6 - 1.1 mg/dL   Total Bilirubin 2.13  0.20 - 1.20 mg/dL   Alkaline Phosphatase 75  40 - 150 U/L   AST 28  5 - 34 U/L   ALT 21  0 - 55 U/L   Total Protein 6.7  6.4 - 8.3 g/dL   Albumin 3.7  3.5 - 5.0 g/dL   Calcium 08.6  8.4 - 57.8 mg/dL   Anion Gap 9  3 - 11  mEq/L    Lab Results  Component Value Date   WBC 9.7 05/09/2013   HGB 9.7* 05/09/2013   HCT 30.1* 05/09/2013   MCV 78.4* 05/09/2013   PLT 240 05/09/2013   ASSESSMENT & PLAN:  #1 worsening microcytic anemia due to ongoing GI blood loss The patient has ongoing blood loss from her pressure ulcer around her stoma site. She also had history of AVM. She have evidence of iron deficiency. She cannot tolerate oral iron supplement. She had severe abdominal pain and constipation with oral iron supplement We discussed some of the risks, benefits, and alternatives of intravenous iron infusions. The patient is symptomatic from anemia and the iron level is critically low. She tolerated oral iron supplement poorly and desires to achieved higher levels of iron faster for adequate hematopoesis. Some of the side-effects to be expected including risks of infusion reactions, phlebitis, headaches, nausea and fatigue.  The patient is willing to proceed. Due to her history of allergic reaction to IV iron dextran, I plan to switch her to IV Feraheme I will see her back in a month after iron infusion to recheck her blood work and iron studies to make sure she is adequately replaced. #2 pressure ulcer at the stoma site #3 worsening parastomal hernia I recommended she see a stoma nurse to see if she can have a different appliance putting in so that it would eliminate the problem with the pressure  ulcer that is chronically oozing blood. She has an appointment to see the stoma nurse tomorrow. #4 history of AVM If, despite adequate iron replacement, and if we correct the problem with her blood loss from the pressure ulcer around the stoma, and she had worsening anemia, we might have to send her back to the GI service for repeat endoscopy as I suspect she may have chronic GI bleed as well. She agreed to put on hold GI workup until her iron is adequately replaced. #5 history of breast cancer She is supposed to have repeat  mammogram. The patient say she had mammogram scheduled for next week to be done #6 history of sarcoma The patient cannot remember all the details about this. Apparently involve her right hand. We will observe. #7 history of B12 deficiency Her B12 level is adequate. We will observe. All questions were answered. The patient knows to call the clinic with any problems, questions or concerns. No barriers to learning was detected.  [facility], Bruna Dills, MD 05/09/2013 4:26 PM

## 2013-05-09 NOTE — Telephone Encounter (Signed)
gv and printed appt sched and avs for pt for NOV and Dec...sched iron

## 2013-05-10 ENCOUNTER — Ambulatory Visit: Payer: Medicare Other | Admitting: Hematology and Oncology

## 2013-05-10 ENCOUNTER — Ambulatory Visit (HOSPITAL_BASED_OUTPATIENT_CLINIC_OR_DEPARTMENT_OTHER): Payer: Medicare Other

## 2013-05-10 VITALS — BP 122/55 | HR 78 | Temp 98.3°F | Resp 20

## 2013-05-10 DIAGNOSIS — D500 Iron deficiency anemia secondary to blood loss (chronic): Secondary | ICD-10-CM

## 2013-05-10 DIAGNOSIS — K922 Gastrointestinal hemorrhage, unspecified: Secondary | ICD-10-CM

## 2013-05-10 DIAGNOSIS — D509 Iron deficiency anemia, unspecified: Secondary | ICD-10-CM

## 2013-05-10 DIAGNOSIS — D518 Other vitamin B12 deficiency anemias: Secondary | ICD-10-CM

## 2013-05-10 LAB — IRON AND TIBC CHCC: %SAT: 5 % — ABNORMAL LOW (ref 21–57)

## 2013-05-10 MED ORDER — SODIUM CHLORIDE 0.9 % IV SOLN
1020.0000 mg | Freq: Once | INTRAVENOUS | Status: AC
  Administered 2013-05-10: 1020 mg via INTRAVENOUS
  Filled 2013-05-10: qty 34

## 2013-05-10 MED ORDER — SODIUM CHLORIDE 0.9 % IV SOLN
Freq: Once | INTRAVENOUS | Status: AC
  Administered 2013-05-10: 13:00:00 via INTRAVENOUS

## 2013-05-10 NOTE — Patient Instructions (Signed)

## 2013-05-13 ENCOUNTER — Telehealth: Payer: Self-pay | Admitting: *Deleted

## 2013-05-13 ENCOUNTER — Encounter: Payer: Self-pay | Admitting: Hematology and Oncology

## 2013-05-13 NOTE — Telephone Encounter (Signed)
Experiencing mild temperature elevation (100.4) in the few days following iron infusion on Friday, then actually the inverse last night (97.7). Is it possible that this is related to the infusion, or should I assume other causes? until recently, temperature had remained quite stable.   This message above was received via MyChart.  Called patient who lives at [facility].  Reports temperature today is 100.3 checked by nurses with an ear thermometer.  Denies chills or signs of infection.  Does not drink a lot of water because she voids every twenty minutes.  Reports her "internist tried her on a pill that started with the letter 'M' a year ago but it didn't stop the frequency".  "I'm always warm and no change with the temperatures.  I don't perspire.  No odor to my urine and the color is pale.  My colostomy is emptying the same with no diarrhea and no cough.  I don't cough but I have tightness in chest which is normal due to double hernia and a scar the size of a soccer ball."   Will notify Dr. Bertis Ruddy.  Next scheduled f/u is 06-06-2013.      This nurse asked that she get an oral thermometer or obtain one from [place] at her next visit as ear is not reliable due to user placement and wax in our ears.  Asked that she FF from the time she awakens till 2 to 3 hours before bedtime and to be careful ambulating with the walker.

## 2013-05-14 ENCOUNTER — Ambulatory Visit (INDEPENDENT_AMBULATORY_CARE_PROVIDER_SITE_OTHER): Payer: Medicare Other | Admitting: Internal Medicine

## 2013-05-14 ENCOUNTER — Telehealth: Payer: Self-pay | Admitting: *Deleted

## 2013-05-14 ENCOUNTER — Encounter: Payer: Self-pay | Admitting: Internal Medicine

## 2013-05-14 VITALS — BP 129/68 | HR 78 | Wt 173.0 lb

## 2013-05-14 DIAGNOSIS — I5031 Acute diastolic (congestive) heart failure: Secondary | ICD-10-CM

## 2013-05-14 DIAGNOSIS — F411 Generalized anxiety disorder: Secondary | ICD-10-CM

## 2013-05-14 LAB — MDC_IDC_ENUM_SESS_TYPE_INCLINIC
Battery Impedance: 2190 Ohm
Battery Voltage: 2.7 V
Brady Statistic AP VP Percent: 6 %
Brady Statistic AP VS Percent: 0 %
Brady Statistic AS VS Percent: 0 %
Date Time Interrogation Session: 20141125125509
Lead Channel Impedance Value: 533 Ohm
Lead Channel Pacing Threshold Amplitude: 1 V
Lead Channel Pacing Threshold Pulse Width: 0.4 ms
Lead Channel Pacing Threshold Pulse Width: 0.4 ms
Lead Channel Setting Pacing Amplitude: 2.5 V
Lead Channel Setting Pacing Pulse Width: 0.4 ms

## 2013-05-14 NOTE — Assessment & Plan Note (Signed)
Her heart failure symptoms are well compensated. She will continue her current medical therapy, and maintain a low-sodium diet.

## 2013-05-14 NOTE — Patient Instructions (Signed)
Your physician wants you to follow-up in: 6 months with Dr Court Joy will receive a reminder letter in the mail two months in advance. If you don't receive a letter, please call our office to schedule the follow-up appointment.    Remote monitoring is used to monitor your Pacemaker of ICD from home. This monitoring reduces the number of office visits required to check your device to one time per year. It allows [place] to keep an eye on the functioning of your device to ensure it is working properly. You are scheduled for a device check from home on 08/15/13. You may send your transmission at any time that day. If you have a wireless device, the transmission will be sent automatically. After your physician reviews your transmission, you will receive a postcard with your next transmission date.

## 2013-05-14 NOTE — Assessment & Plan Note (Signed)
The patient has a newly and situation and remained somewhat anxious as well as sad over the loss of her husband. Today we spent time discussing his loss (he was a patient of mine) and her plans for her future.  She is recently moved to a new apartment in assisted living. She has struggled somewhat with increased structure in her life and a lack of living space. I've encouraged the patient that her situation will improve.

## 2013-05-14 NOTE — Telephone Encounter (Signed)
Please call pt, fever is not an anticipated adverse effect from IV iron Also, we define fever as T greater than 101 If her fever persists, she needs to see a provider for further evaluation

## 2013-05-14 NOTE — Progress Notes (Signed)
HPI Mrs. Faw returns today for followup. She is a very pleasant 79 year old woman with a history of complete heart block, status post permanent pacemaker insertion, severe lung disease, requiring nocturnal oxygen, paroxysmal atrial fibrillation,  Hypertension, who is status post colon surgery for diverticulitis, complicated by wound dehiscence. She has a colostomy tube in place. The patient says when he developed a large abdominal hernia. She is now in assisted living. This is the first time we have seen the patient since her husband died. Her heart failure symptoms have improved as has her pulmonary problems. Allergies  Allergen Reactions  . Aspirin Shortness Of Breath    Kicks up your asthma  . Sulfonamide Derivatives Hives  . Ibuprofen Other (See Comments)    Doesn't take because of her aspirin allergy  . Iron Dextran Itching     itching after test dose  . Metoclopramide Hcl Other (See Comments)    REACTION: chomping mouth     Current Outpatient Prescriptions  Medication Sig Dispense Refill  . dexlansoprazole (DEXILANT) 60 MG capsule Take 60 mg by mouth daily before breakfast.      . DULoxetine (CYMBALTA) 30 MG capsule Take 30 mg by mouth daily.      [name] EPINEPHrine (EPIPEN IJ) Inject as directed.      [name] ESZOPICLONE 3 MG tablet       . furosemide (LASIX) 20 MG tablet Take 1 tablet (20 mg total) by mouth daily. For fluid retention.  30 tablet  0  . hydroxypropyl methylcellulose (ISOPTO TEARS) 2.5 % ophthalmic solution Place 2 drops into both eyes 2 (two) times daily.      [name] levothyroxine (SYNTHROID, LEVOTHROID) 50 MCG tablet Take 50 mcg by mouth daily before breakfast.       . losartan (COZAAR) 50 MG tablet Take 50 mg by mouth daily.      . metoprolol succinate (TOPROL-XL) 50 MG 24 hr tablet TAKE 1 TABLET BY MOUTH EVERY DAY  90 tablet  3  . mirtazapine (REMERON) 30 MG tablet Take 15 mg by mouth at bedtime.       . Multiple Vitamin (MULTIVITAMIN WITH MINERALS) TABS Take 1 tablet  by mouth daily.      . ondansetron (ZOFRAN) 4 MG tablet Take 4 mg by mouth every 6 (six) hours as needed for nausea.      [name] oxyCODONE (OXY IR/ROXICODONE) 5 MG immediate release tablet Take 5-10 mg by mouth every 4 (four) hours as needed (for cough / pain).       . polyethylene glycol (MIRALAX / GLYCOLAX) packet Take 17 g by mouth daily.      . potassium chloride SA (K-DUR,KLOR-CON) 10 MEQ tablet Take 1 tablet (10 mEq total) by mouth daily.  30 tablet  0  . pravastatin (PRAVACHOL) 40 MG tablet Take 40 mg by mouth daily.      . predniSONE (DELTASONE) 5 MG tablet Take 7.5 mg by mouth daily.      . promethazine (PHENERGAN) 25 MG tablet Take 25 mg by mouth every 6 (six) hours as needed. For nausea      . ranitidine (ZANTAC) 300 MG tablet Take 300 mg by mouth daily.       [name] senna-docusate (SENOKOT-S) 8.6-50 MG per tablet Take 1 tablet by mouth daily.      . Vitamin D, Ergocalciferol, (DRISDOL) 50000 UNITS CAPS Take 50,000 Units by mouth every 7 (seven) days. On Sundays      . XOLAIR 150 MG  injection       . zafirlukast (ACCOLATE) 20 MG tablet Take 20 mg by mouth 2 (two) times daily.      [name] albuterol (PROVENTIL HFA;VENTOLIN HFA) 108 (90 BASE) MCG/ACT inhaler Inhale 2 puffs into the lungs 2 (two) times daily.      [name] albuterol (PROVENTIL) (2.5 MG/3ML) 0.083% nebulizer solution Take 2.5 mg by nebulization every 4 (four) hours as needed for wheezing or shortness of breath.      . ALPRAZolam (XANAX) 0.25 MG tablet Take 0.25 mg by mouth every 4 (four) hours as needed for anxiety.      . baclofen (LIORESAL) 10 MG tablet Take 10 mg by mouth every 8 (eight) hours as needed.      . beclomethasone (QVAR) 80 MCG/ACT inhaler Inhale 3 puffs into the lungs 3 (three) times daily.       . Budesonide (RHINOCORT AQUA NA) Place 1 spray into the nose daily.       . chlorpheniramine-HYDROcodone (TUSSIONEX) 10-8 MG/5ML LQCR Take 5 mLs by mouth at bedtime as needed (for cough).       No current facility-administered  medications for this visit.     Past Medical History  Diagnosis Date  . Insomnia, unspecified   . Osteoarthrosis, unspecified whether generalized or localized, unspecified site   . Unspecified hypothyroidism   . Unspecified essential hypertension   . Atrial fibrillation   . Sciatica   . Anemia   . COPD (chronic obstructive pulmonary disease)   . UTI (urinary tract infection)   . GERD (gastroesophageal reflux disease)   . Pneumonia   . Asthma   . Clostridium difficile colitis   . Diverticulitis of sigmoid colon 11/2010    with abscess/contained perforation  . Dementia     mild  . Dehydration with hyponatremia 12/2010    hospitalized  . Angiodysplasia of stomach     ablated 2010  . Angiodysplasia of colon     ablated 2010  . Vitamin B12 deficiency (dietary) anemia 04/12/2011  . Shingles   . Sarcoma     right hand  . Malignant neoplasm of breast (female), unspecified site     left  . Dupuytren's disease     left hand  . Vitamin B12 deficiency 04/02/2012  . Complication of anesthesia     " very cautious because of my COPD"  . Pacemaker     ROS:   All systems reviewed and negative except as noted in the HPI.   Past Surgical History  Procedure Laterality Date  . Nasal sinus surgery      x 3  . Appendectomy    . Hernia repair    . Abdominal hysterectomy    . Pacemaker insertion    . Eye surgery      x6-bilateral  . Finger amputation      right  . Foot surgery      right toes x 3  . Tonsillectomy    . Colonoscopy  01/2009    w/ablation of 3 mm AVM, diverticulosis, external hemorrhoids  . Upper gastrointestinal endoscopy  01/2009    w/polypectomy, AVM, hyperplastic polyp  . Abdominal hysterectomy    . Laparotomy  11/12/2011    Procedure: EXPLORATORY LAPAROTOMY;  Surgeon: Lodema Pilot, DO;  Location: [facility];  Service: General;  Laterality: N/A;  . Colostomy revision  11/12/2011    Procedure: COLON RESECTION SIGMOID;  Surgeon: Lodema Pilot, DO;  Location: [facility];   Service: General;  Laterality: N/A;  .  Colostomy  11/12/2011    Procedure: COLOSTOMY;  Surgeon: Lodema Pilot, DO;  Location: [facility];  Service: General;  Laterality: Left;  End colostomy  . Laparotomy  11/19/2011    Procedure: EXPLORATORY LAPAROTOMY;  Surgeon: Kandis Cocking, MD;  Location: [facility];  Service: General;  Laterality: N/A;  . Gastrostomy  11/19/2011    Procedure: GASTROSTOMY;  Surgeon: Kandis Cocking, MD;  Location: [facility];  Service: General;;  INSERTION GASTROSTOMY TUBE  . Application of wound vac  11/19/2011    Procedure: APPLICATION OF WOUND VAC;  Surgeon: Kandis Cocking, MD;  Location: [facility];  Service: General;  Laterality: N/A;     Family History  Problem Relation Age of Onset  . Emphysema Paternal Uncle   . Heart disease Mother   . Colon cancer Mother   . Pancreatic cancer Father   . Cancer Father 63    pancreatic cancer  . Tuberculosis Paternal Uncle   . Breast cancer Maternal Aunt   . Breast cancer Cousin   . Prostate cancer Cousin   . Colon cancer Cousin   . Uterine cancer Cousin      History   Social History  . Marital Status: Widowed    Spouse Name: N/A    Number of Children: 3  . Years of Education: N/A   Occupational History  . Home Maker    Social History Main Topics  . Smoking status: Former Smoker -- 0.50 packs/day for 21 years    Types: Cigarettes    Quit date: 06/20/1977  . Smokeless tobacco: Never Used  . Alcohol Use: Yes     Comment: 1 glass wine/night  . Drug Use: No  . Sexual Activity: Not on file   Other Topics Concern  . Not on file   Social History Narrative  . No narrative on file     BP 129/68  Pulse 78  Wt 173 lb (78.472 kg)  Physical Exam:  Well appearing 79 year old woman, NAD HEENT: Unremarkable Neck:  No JVD, no thyromegally Back:  No CVA tenderness Lungs:  Clear with no wheezes, rales, or rhonchi. Well-healed pacemaker incision HEART:  Regular rate rhythm, no murmurs, no rubs, no clicks Abd:  soft, positive  bowel sounds, no organomegally, no rebound, no guarding Ext:  2 plus pulses, no edema, no cyanosis, no clubbing Skin:  No rashes no nodules Neuro:  CN II through XII intact, motor grossly intact  DEVICE  Normal device function.  See PaceArt for details.   Assess/Plan:

## 2013-05-14 NOTE — Telephone Encounter (Signed)
Called pt to inform her that Dr. Bertis Ruddy was made aware of her low grade fevers and said this is not anticipated adverse effect of IV iron.   Instructed pt to contact her PCP if her fevers persist.  Pt verbalized understanding.

## 2013-05-30 ENCOUNTER — Encounter: Payer: Self-pay | Admitting: Internal Medicine

## 2013-06-05 ENCOUNTER — Ambulatory Visit: Payer: Medicare Other

## 2013-06-05 ENCOUNTER — Ambulatory Visit
Admission: RE | Admit: 2013-06-05 | Discharge: 2013-06-05 | Disposition: A | Discharge status: Home / Self Care | Payer: Medicare Other | Source: Ambulatory Visit

## 2013-06-05 DIAGNOSIS — Z1231 Encounter for screening mammogram for malignant neoplasm of breast: Secondary | ICD-10-CM

## 2013-06-05 DIAGNOSIS — Z853 Personal history of malignant neoplasm of breast: Secondary | ICD-10-CM

## 2013-06-05 DIAGNOSIS — Z9889 Other specified postprocedural states: Secondary | ICD-10-CM

## 2013-06-06 ENCOUNTER — Ambulatory Visit (HOSPITAL_BASED_OUTPATIENT_CLINIC_OR_DEPARTMENT_OTHER): Payer: Medicare Other | Admitting: Hematology and Oncology

## 2013-06-06 ENCOUNTER — Other Ambulatory Visit (HOSPITAL_BASED_OUTPATIENT_CLINIC_OR_DEPARTMENT_OTHER): Payer: Medicare Other

## 2013-06-06 ENCOUNTER — Encounter: Payer: Self-pay | Admitting: Hematology and Oncology

## 2013-06-06 VITALS — BP 147/78 | HR 101 | Temp 99.1°F | Resp 18 | Ht 64.0 in | Wt 173.4 lb

## 2013-06-06 DIAGNOSIS — D500 Iron deficiency anemia secondary to blood loss (chronic): Secondary | ICD-10-CM

## 2013-06-06 DIAGNOSIS — L8990 Pressure ulcer of unspecified site, unspecified stage: Secondary | ICD-10-CM

## 2013-06-06 DIAGNOSIS — Z933 Colostomy status: Secondary | ICD-10-CM

## 2013-06-06 DIAGNOSIS — K922 Gastrointestinal hemorrhage, unspecified: Secondary | ICD-10-CM

## 2013-06-06 DIAGNOSIS — L89899 Pressure ulcer of other site, unspecified stage: Secondary | ICD-10-CM

## 2013-06-06 DIAGNOSIS — D509 Iron deficiency anemia, unspecified: Secondary | ICD-10-CM

## 2013-06-06 DIAGNOSIS — D518 Other vitamin B12 deficiency anemias: Secondary | ICD-10-CM

## 2013-06-06 LAB — CBC & DIFF AND RETIC
BASO%: 0.3 % (ref 0.0–2.0)
Basophils Absolute: 0 10e3/uL (ref 0.0–0.1)
EOS%: 1.9 % (ref 0.0–7.0)
Eosinophils Absolute: 0.2 10e3/uL (ref 0.0–0.5)
HCT: 39.1 % (ref 34.8–46.6)
HGB: 12.1 g/dL (ref 11.6–15.9)
Immature Retic Fract: 2.5 % (ref 1.60–10.00)
LYMPH%: 12.2 % — ABNORMAL LOW (ref 14.0–49.7)
MCH: 28.1 pg (ref 25.1–34.0)
MCHC: 30.9 g/dL — ABNORMAL LOW (ref 31.5–36.0)
MCV: 90.7 fL (ref 79.5–101.0)
MONO#: 0.6 10e3/uL (ref 0.1–0.9)
MONO%: 5.8 % (ref 0.0–14.0)
NEUT#: 8 10e3/uL — ABNORMAL HIGH (ref 1.5–6.5)
NEUT%: 79.8 % — ABNORMAL HIGH (ref 38.4–76.8)
Platelets: 176 10e3/uL (ref 145–400)
RBC: 4.31 10e6/uL (ref 3.70–5.45)
RDW: 20.8 % — ABNORMAL HIGH (ref 11.2–14.5)
Retic %: 1.73 % (ref 0.70–2.10)
Retic Ct Abs: 74.56 10e3/uL (ref 33.70–90.70)
WBC: 10 10e3/uL (ref 3.9–10.3)
lymph#: 1.2 10e3/uL (ref 0.9–3.3)

## 2013-06-06 LAB — COMPREHENSIVE METABOLIC PANEL (CC13)
ALT: 29 U/L (ref 0–55)
Albumin: 3.8 g/dL (ref 3.5–5.0)
Alkaline Phosphatase: 69 U/L (ref 40–150)
CO2: 29 mEq/L (ref 22–29)
Potassium: 4.9 mEq/L (ref 3.5–5.1)
Sodium: 142 mEq/L (ref 136–145)
Total Bilirubin: 0.38 mg/dL (ref 0.20–1.20)
Total Protein: 6.4 g/dL (ref 6.4–8.3)

## 2013-06-06 LAB — IRON AND TIBC CHCC
%SAT: 43 % (ref 21–57)
Iron: 127 ug/dL (ref 41–142)
TIBC: 291 ug/dL (ref 236–444)

## 2013-06-06 NOTE — Progress Notes (Signed)
[facility] OFFICE PROGRESS NOTE  Miranda Sauer, MD DIAGNOSIS:  History of chronic iron deficiency anemia due to chronic GI bleed and history of B12 deficiency  SUMMARY OF HEMATOLOGIC HISTORY: This is a pleasant 79 year old lady with multiple bowel surgery, colostomy, history of AVM he had diagnosis of iron deficiency anemia and B12 deficiency. She had received B12 injection in the past. She received intravenous iron dextran in the past with an allergic reaction. She denies any recent blood transfusion. On 05/10/2013, she received 1 dose of iron infusion without complications INTERVAL HISTORY: [name] 79 y.o. female returns for further followup. She is to have chronic oozing from the colostomy site. Apparently a new larger size of colostomy bag has been ordered. She was in a pressure sore in that region to cause her to lose blood on a chronic basis. The patient denies any recent signs or symptoms of bleeding such as spontaneous epistaxis, hematuria or hematochezia. She still continued to feel fatigued.  I have reviewed the past medical history, past surgical history, social history and family history with the patient and they are unchanged from previous note.  ALLERGIES:  is allergic to aspirin; sulfonamide derivatives; ibuprofen; iron dextran; and metoclopramide hcl.  MEDICATIONS:  Current Outpatient Prescriptions  Medication Sig Dispense Refill  . albuterol (PROVENTIL HFA;VENTOLIN HFA) 108 (90 BASE) MCG/ACT inhaler Inhale 2 puffs into the lungs 2 (two) times daily.      [name] albuterol (PROVENTIL) (2.5 MG/3ML) 0.083% nebulizer solution Take 2.5 mg by nebulization every 4 (four) hours as needed for wheezing or shortness of breath.      . ALPRAZolam (XANAX) 0.25 MG tablet Take 0.25 mg by mouth every 4 (four) hours as needed for anxiety.      . baclofen (LIORESAL) 10 MG tablet Take 10 mg by mouth every 8 (eight) hours as needed.      . beclomethasone (QVAR) 80 MCG/ACT  inhaler Inhale 3 puffs into the lungs 3 (three) times daily.       . Budesonide (RHINOCORT AQUA NA) Place 1 spray into the nose daily.       . chlorpheniramine-HYDROcodone (TUSSIONEX) 10-8 MG/5ML LQCR Take 5 mLs by mouth at bedtime as needed (for cough).      [name] dexlansoprazole (DEXILANT) 60 MG capsule Take 60 mg by mouth daily before breakfast.      . DULoxetine (CYMBALTA) 30 MG capsule Take 30 mg by mouth daily.      [name] EPINEPHrine (EPIPEN IJ) Inject as directed.      [name] ESZOPICLONE 3 MG tablet       . furosemide (LASIX) 20 MG tablet Take 20 mg by mouth daily as needed. For fluid retention.      . hydroxypropyl methylcellulose (ISOPTO TEARS) 2.5 % ophthalmic solution Place 2 drops into both eyes 2 (two) times daily.      [name] losartan (COZAAR) 50 MG tablet Take 50 mg by mouth daily.      . metoprolol succinate (TOPROL-XL) 50 MG 24 hr tablet TAKE 1 TABLET BY MOUTH EVERY DAY  90 tablet  3  . mirtazapine (REMERON) 30 MG tablet Take 15 mg by mouth at bedtime.       . Multiple Vitamin (MULTIVITAMIN WITH MINERALS) TABS Take 1 tablet by mouth daily.      . ondansetron (ZOFRAN) 4 MG tablet Take 4 mg by mouth every 6 (six) hours as needed for nausea.      [name] oxyCODONE (OXY IR/ROXICODONE) 5 MG immediate release tablet  Take 5-10 mg by mouth every 4 (four) hours as needed (for cough / pain).       . polyethylene glycol (MIRALAX / GLYCOLAX) packet Take 17 g by mouth daily.      . pravastatin (PRAVACHOL) 40 MG tablet Take 40 mg by mouth daily.      . predniSONE (DELTASONE) 5 MG tablet Take 7.5 mg by mouth daily. 7.5 mg every other day alternate with 5 mg every other day      . promethazine (PHENERGAN) 25 MG tablet Take 25 mg by mouth every 6 (six) hours as needed. For nausea      . ranitidine (ZANTAC) 300 MG tablet Take 300 mg by mouth daily.       [name] senna-docusate (SENOKOT-S) 8.6-50 MG per tablet Take 1 tablet by mouth daily.      . Vitamin D, Ergocalciferol, (DRISDOL) 50000 UNITS CAPS Take 50,000 Units by mouth  every 7 (seven) days. On Sundays      . XOLAIR 150 MG injection Inject into the skin every 28 (twenty-eight) days.       . zafirlukast (ACCOLATE) 20 MG tablet Take 20 mg by mouth 2 (two) times daily.      [name] levothyroxine (SYNTHROID, LEVOTHROID) 50 MCG tablet Take 50 mcg by mouth daily before breakfast.       . potassium chloride SA (K-DUR,KLOR-CON) 10 MEQ tablet Take 1 tablet (10 mEq total) by mouth daily.  30 tablet  0   No current facility-administered medications for this visit.     REVIEW OF SYSTEMS:   Constitutional: Denies fevers, chills or night sweats Eyes: Denies blurriness of vision Ears, nose, mouth, throat, and face: Denies mucositis or sore throat Respiratory: Denies cough, dyspnea or wheezes Cardiovascular: Denies palpitation, chest discomfort or lower extremity swelling Skin: Denies abnormal skin rashes Lymphatics: Denies new lymphadenopathy or easy bruising Neurological:Denies numbness, tingling or new weaknesses Behavioral/Psych: Mood is stable, no new changes  All other systems were reviewed with the patient and are negative.  PHYSICAL EXAMINATION: ECOG PERFORMANCE STATUS: 1 - Symptomatic but completely ambulatory  Filed Vitals:   06/06/13 1405  BP: 147/78  Pulse: 101  Temp: 99.1 F (37.3 C)  Resp: 18   Filed Weights   06/06/13 1405  Weight: 173 lb 6.4 oz (78.654 kg)    GENERAL:alert, no distress and comfortable. She is morbidly obese SKIN: skin color, texture, turgor are normal, no rashes or significant lesions ABDOMEN:abdomen soft, non-tender and normal bowel sounds Musculoskeletal:no cyanosis of digits and no clubbing  NEURO: alert & oriented x 3 with fluent speech, no focal motor/sensory deficits  LABORATORY DATA:  I have reviewed the data as listed Results for orders placed in visit on 06/06/13 (from the past 48 hour(s))  CBC & DIFF AND RETIC     Status: Abnormal   Collection Time    06/06/13  1:43 PM      Result Value Range   WBC 10.0  3.9 -  10.3 10e3/uL   NEUT# 8.0 (*) 1.5 - 6.5 10e3/uL   HGB 12.1  11.6 - 15.9 g/dL   HCT 91.4  78.2 - 95.6 %   Platelets 176  145 - 400 10e3/uL   MCV 90.7  79.5 - 101.0 fL   MCH 28.1  25.1 - 34.0 pg   MCHC 30.9 (*) 31.5 - 36.0 g/dL   RBC 2.13  0.86 - 5.78 10e6/uL   RDW 20.8 (*) 11.2 - 14.5 %   lymph# 1.2  0.9 -  3.3 10e3/uL   MONO# 0.6  0.1 - 0.9 10e3/uL   Eosinophils Absolute 0.2  0.0 - 0.5 10e3/uL   Basophils Absolute 0.0  0.0 - 0.1 10e3/uL   NEUT% 79.8 (*) 38.4 - 76.8 %   LYMPH% 12.2 (*) 14.0 - 49.7 %   MONO% 5.8  0.0 - 14.0 %   EOS% 1.9  0.0 - 7.0 %   BASO% 0.3  0.0 - 2.0 %   Retic % 1.73  0.70 - 2.10 %   Retic Ct Abs 74.56  33.70 - 90.70 10e3/uL   Immature Retic Fract 2.50  1.60 - 10.00 %  COMPREHENSIVE METABOLIC PANEL (CC13)     Status: Abnormal   Collection Time    06/06/13  1:43 PM      Result Value Range   Sodium 142  136 - 145 mEq/L   Potassium 4.9  3.5 - 5.1 mEq/L   Chloride 107  98 - 109 mEq/L   CO2 29  22 - 29 mEq/L   Glucose 81  70 - 140 mg/dl   BUN 09.6 (*) 7.0 - 04.5 mg/dL   Creatinine 1.0  0.6 - 1.1 mg/dL   Total Bilirubin 4.09  0.20 - 1.20 mg/dL   Alkaline Phosphatase 69  40 - 150 U/L   AST 30  5 - 34 U/L   ALT 29  0 - 55 U/L   Total Protein 6.4  6.4 - 8.3 g/dL   Albumin 3.8  3.5 - 5.0 g/dL   Calcium 81.1  8.4 - 91.4 mg/dL   Anion Gap 6  3 - 11 mEq/L    Lab Results  Component Value Date   WBC 10.0 06/06/2013   HGB 12.1 06/06/2013   HCT 39.1 06/06/2013   MCV 90.7 06/06/2013   PLT 176 06/06/2013    RADIOGRAPHIC STUDIES: I have personally reviewed the radiological images as listed and agreed with the findings in the report. Mm Digital Screening  06/06/2013   CLINICAL DATA:  Screening. History of left breast cancer, status post lumpectomy in 2004.  EXAM: DIGITAL SCREENING BILATERAL MAMMOGRAM WITH CAD  COMPARISON:  Previous exam(s).  ACR Breast Density Category b: There are scattered areas of fibroglandular density.  FINDINGS: There are no findings  suspicious for malignancy. Images were processed with CAD.  IMPRESSION: No mammographic evidence of malignancy. A result letter of this screening mammogram will be mailed directly to the patient.  RECOMMENDATION: Screening mammogram in one year. (Code:SM-B-01Y)  BI-RADS CATEGORY  1: Negative   Electronically Signed   By: Britta Mccreedy M.D.   On: 06/06/2013 08:31    ASSESSMENT & PLAN:  #1 chronic GI bleed with iron deficiency Her anemia has resolved. Iron studies are still pending. She tolerated oral iron supplement poorly in the past. She tolerated iron and ferritin well without complications. Due to ongoing chronic GI bleed, I recommend further iron infusion to keep her ferritin above 50. I am waiting for final results to come back today and I will call the patient. 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 All questions were answered. The patient knows to call the clinic with any problems, questions or concerns. No barriers to learning was detected.  I spent 15 minutes counseling the patient face to face. The total time spent in the appointment was 20 minutes and more than 50% was on counseling.     Lacheryl Niesen, MD 06/06/2013 2:50 PM

## 2013-06-07 ENCOUNTER — Telehealth: Payer: Self-pay | Admitting: *Deleted

## 2013-06-07 NOTE — Telephone Encounter (Signed)
sw pt gv appt for 09/05/13 w/labs@ 2:30p and ov@ 3p. Pt is aware...td

## 2013-06-12 ENCOUNTER — Encounter: Payer: Self-pay | Admitting: Internal Medicine

## 2013-06-17 ENCOUNTER — Other Ambulatory Visit: Payer: Self-pay | Admitting: Internal Medicine

## 2013-06-18 ENCOUNTER — Other Ambulatory Visit: Payer: Medicare Other

## 2013-08-15 ENCOUNTER — Encounter: Payer: Medicare Other | Admitting: *Deleted

## 2013-08-20 ENCOUNTER — Telehealth: Payer: Self-pay | Admitting: Hematology and Oncology

## 2013-08-20 ENCOUNTER — Ambulatory Visit: Payer: Medicare Other | Admitting: Hematology and Oncology

## 2013-08-20 ENCOUNTER — Other Ambulatory Visit: Payer: Medicare Other | Admitting: Lab

## 2013-08-20 NOTE — Telephone Encounter (Signed)
pt camer by today thinking the appt was today ,gave pt appt for lab and Md appt for [facility]

## 2013-08-27 ENCOUNTER — Encounter: Payer: Self-pay | Admitting: *Deleted

## 2013-09-05 ENCOUNTER — Telehealth: Payer: Self-pay | Admitting: Hematology and Oncology

## 2013-09-05 ENCOUNTER — Encounter: Payer: Self-pay | Admitting: Hematology and Oncology

## 2013-09-05 ENCOUNTER — Other Ambulatory Visit (HOSPITAL_BASED_OUTPATIENT_CLINIC_OR_DEPARTMENT_OTHER): Payer: Medicare Other

## 2013-09-05 ENCOUNTER — Ambulatory Visit (HOSPITAL_BASED_OUTPATIENT_CLINIC_OR_DEPARTMENT_OTHER): Payer: Medicare Other | Admitting: Hematology and Oncology

## 2013-09-05 VITALS — BP 146/60 | HR 88 | Temp 98.7°F | Resp 19 | Ht 64.0 in | Wt 174.6 lb

## 2013-09-05 DIAGNOSIS — D518 Other vitamin B12 deficiency anemias: Secondary | ICD-10-CM

## 2013-09-05 DIAGNOSIS — Z8589 Personal history of malignant neoplasm of other organs and systems: Secondary | ICD-10-CM

## 2013-09-05 DIAGNOSIS — C50919 Malignant neoplasm of unspecified site of unspecified female breast: Secondary | ICD-10-CM

## 2013-09-05 DIAGNOSIS — Z862 Personal history of diseases of the blood and blood-forming organs and certain disorders involving the immune mechanism: Secondary | ICD-10-CM

## 2013-09-05 DIAGNOSIS — D500 Iron deficiency anemia secondary to blood loss (chronic): Secondary | ICD-10-CM

## 2013-09-05 DIAGNOSIS — Z853 Personal history of malignant neoplasm of breast: Secondary | ICD-10-CM

## 2013-09-05 LAB — CBC & DIFF AND RETIC
BASO%: 0.3 % (ref 0.0–2.0)
BASOS ABS: 0 10*3/uL (ref 0.0–0.1)
EOS ABS: 0.1 10*3/uL (ref 0.0–0.5)
EOS%: 0.8 % (ref 0.0–7.0)
HEMATOCRIT: 37.3 % (ref 34.8–46.6)
HEMOGLOBIN: 12.5 g/dL (ref 11.6–15.9)
Immature Retic Fract: 2.3 % (ref 1.60–10.00)
LYMPH%: 6.6 % — AB (ref 14.0–49.7)
MCH: 31.4 pg (ref 25.1–34.0)
MCHC: 33.5 g/dL (ref 31.5–36.0)
MCV: 93.7 fL (ref 79.5–101.0)
MONO#: 0.5 10*3/uL (ref 0.1–0.9)
MONO%: 4.9 % (ref 0.0–14.0)
NEUT%: 87.4 % — AB (ref 38.4–76.8)
NEUTROS ABS: 9.3 10*3/uL — AB (ref 1.5–6.5)
PLATELETS: 200 10*3/uL (ref 145–400)
RBC: 3.98 10*6/uL (ref 3.70–5.45)
RDW: 14.5 % (ref 11.2–14.5)
Retic %: 1.89 % (ref 0.70–2.10)
Retic Ct Abs: 75.22 10*3/uL (ref 33.70–90.70)
WBC: 10.7 10*3/uL — ABNORMAL HIGH (ref 3.9–10.3)
lymph#: 0.7 10*3/uL — ABNORMAL LOW (ref 0.9–3.3)
nRBC: 0 % (ref 0–0)

## 2013-09-05 LAB — FERRITIN CHCC: FERRITIN: 140 ng/mL (ref 9–269)

## 2013-09-05 LAB — IRON AND TIBC CHCC
%SAT: 41 % (ref 21–57)
IRON: 118 ug/dL (ref 41–142)
TIBC: 288 ug/dL (ref 236–444)
UIBC: 170 ug/dL (ref 120–384)

## 2013-09-05 LAB — VITAMIN B12: Vitamin B-12: 703 pg/mL (ref 211–911)

## 2013-09-05 NOTE — Telephone Encounter (Signed)
per [facility] sch labs & Dr Lottie Rater appt in 1 yr/sch March 2016

## 2013-09-05 NOTE — Progress Notes (Signed)
[place] OFFICE PROGRESS NOTE  Miranda Ringer, MD DIAGNOSIS:  Remote history of left breast cancer, history of B12 deficiency and iron deficiency  SUMMARY OF HEMATOLOGIC HISTORY: This patient have remote history of breast cancer diagnosed from screening mammogram. She had surgery, radiation therapy and adjuvant tamoxifen. 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 She had received B12 injection for B12 deficiency and intravenous iron for iron deficiency anemia. The patient had history of allergic reaction to intravenous iron dextran. She received intravenous iron feraheme in November [id number] without complications. INTERVAL HISTORY: [name] 80 y.o. female returns for further followup. She has no further losing from colostomy site. She complained of chronic fatigue. The patient denies any recent signs or symptoms of bleeding such as spontaneous epistaxis, hematuria or hematochezia. She denies any recent abnormal breast examination, palpable mass, abnormal breast appearance or nipple changes  I have reviewed the past medical history, past surgical history, social history and family history with the patient and they are unchanged from previous note.  ALLERGIES:  is allergic to aspirin; sulfonamide derivatives; ibuprofen; iron dextran; and metoclopramide hcl.  MEDICATIONS:  Current Outpatient Prescriptions  Medication Sig Dispense Refill  . albuterol (PROVENTIL HFA;VENTOLIN HFA) 108 (90 BASE) MCG/ACT inhaler Inhale 2 puffs into the lungs 2 (two) times daily.      [name] albuterol (PROVENTIL) (2.5 MG/3ML) 0.083% nebulizer solution Take 2.5 mg by nebulization every 4 (four) hours as needed for wheezing or shortness of breath.      . ALPRAZolam (XANAX) 0.25 MG tablet Take 0.25 mg by mouth every 4 (four) hours as needed for anxiety.      . baclofen (LIORESAL) 10 MG tablet Take 10 mg by mouth every 8 (eight) hours as  needed.      . beclomethasone (QVAR) 80 MCG/ACT inhaler Inhale 3 puffs into the lungs 3 (three) times daily.       . Budesonide (RHINOCORT AQUA NA) Place 1 spray into the nose daily.       . chlorpheniramine-HYDROcodone (TUSSIONEX) 10-8 MG/5ML LQCR Take 5 mLs by mouth at bedtime as needed (for cough).      [name] dexlansoprazole (DEXILANT) 60 MG capsule Take 60 mg by mouth daily before breakfast.      . DULoxetine (CYMBALTA) 30 MG capsule Take 30 mg by mouth daily.      [name] EPINEPHrine (EPIPEN IJ) Inject as directed.      [name] ESZOPICLONE 3 MG tablet       . furosemide (LASIX) 20 MG tablet Take 20 mg by mouth daily as needed. For fluid retention.      . hydroxypropyl methylcellulose (ISOPTO TEARS) 2.5 % ophthalmic solution Place 2 drops into both eyes 2 (two) times daily.      [name] losartan (COZAAR) 50 MG tablet Take 50 mg by mouth daily.      . metoprolol succinate (TOPROL-XL) 50 MG 24 hr tablet TAKE 1 TABLET BY MOUTH EVERY DAY  90 tablet  1  . mirtazapine (REMERON) 30 MG tablet Take 15 mg by mouth at bedtime.       . Multiple Vitamin (MULTIVITAMIN WITH MINERALS) TABS Take 1 tablet by mouth daily.      . ondansetron (ZOFRAN) 4 MG tablet Take 4 mg by mouth every 6 (six) hours as needed for nausea.      . polyethylene glycol (MIRALAX / GLYCOLAX) packet Take 17 g by mouth daily.      [name]  pravastatin (PRAVACHOL) 40 MG tablet Take 40 mg by mouth daily.      . predniSONE (DELTASONE) 5 MG tablet Take 7.5 mg by mouth daily. 7.5 mg every other day alternate with 5 mg every other day      . promethazine (PHENERGAN) 25 MG tablet Take 25 mg by mouth every 6 (six) hours as needed. For nausea      . ranitidine (ZANTAC) 300 MG tablet Take 300 mg by mouth daily.       [name] senna-docusate (SENOKOT-S) 8.6-50 MG per tablet Take 1 tablet by mouth daily.      . traZODone (DESYREL) 50 MG tablet       . Vitamin D, Ergocalciferol, (DRISDOL) 50000 UNITS CAPS Take 50,000 Units by mouth every 7 (seven) days. On Sundays      . XOLAIR 150  MG injection Inject into the skin every 28 (twenty-eight) days.       . zafirlukast (ACCOLATE) 20 MG tablet Take 20 mg by mouth 2 (two) times daily.      [name] levothyroxine (SYNTHROID, LEVOTHROID) 50 MCG tablet Take 50 mcg by mouth daily before breakfast.       . oxyCODONE (OXY IR/ROXICODONE) 5 MG immediate release tablet Take 5-10 mg by mouth every 4 (four) hours as needed (for cough / pain).       . potassium chloride SA (K-DUR,KLOR-CON) 10 MEQ tablet Take 1 tablet (10 mEq total) by mouth daily.  30 tablet  0   No current facility-administered medications for this visit.     REVIEW OF SYSTEMS:   Constitutional: Denies fevers, chills or night sweats All other systems were reviewed with the patient and are negative.  PHYSICAL EXAMINATION: ECOG PERFORMANCE STATUS: 1 - Symptomatic but completely ambulatory  Filed Vitals:   09/05/13 1507  BP: 146/60  Pulse: 88  Temp: 98.7 F (37.1 C)  Resp: 19   Filed Weights   09/05/13 1507  Weight: 174 lb 9.6 oz (79.198 kg)    GENERAL:alert, no distress and comfortable. She is morbidly obese SKIN: skin color, texture, turgor are normal, no rashes or significant lesions EYES: normal, Conjunctiva are pink and non-injected, sclera clear OROPHARYNX:no exudate, no erythema and lips, buccal mucosa, and tongue normal  NECK: supple, thyroid normal size, non-tender, without nodularity LYMPH:  no palpable lymphadenopathy in the cervical, axillary or inguinal LUNGS: clear to auscultation and percussion with normal breathing effort HEART: regular rate & rhythm and no murmurs and no lower extremity edema ABDOMEN:abdomen soft, non-tender and normal bowel sounds. Large abdominal wall hernia. The left colostomy site looks okay Musculoskeletal:no cyanosis of digits and no clubbing  NEURO: alert & oriented x 3 with fluent speech, no focal motor/sensory deficits Bilateral breast examination were performed. There is a well healed lumpectomy scar in the inner upper  quadrant of the left breast with significant scarring causing a lump-like sensation at the surgical site. The left nipple appears to be retracted. LABORATORY DATA:  I have reviewed the data as listed Results for orders placed in visit on 09/05/13 (from the past 48 hour(s))  CBC & DIFF AND RETIC     Status: Abnormal   Collection Time    09/05/13  2:43 PM      Result Value Ref Range   WBC 10.7 (*) 3.9 - 10.3 10e3/uL   NEUT# 9.3 (*) 1.5 - 6.5 10e3/uL   HGB 12.5  11.6 - 15.9 g/dL   HCT 37.3  34.8 - 46.6 %   Platelets 200  145 - 400 10e3/uL   MCV 93.7  79.5 - 101.0 fL   MCH 31.4  25.1 - 34.0 pg   MCHC 33.5  31.5 - 36.0 g/dL   RBC 3.98  3.70 - 5.45 10e6/uL   RDW 14.5  11.2 - 14.5 %   lymph# 0.7 (*) 0.9 - 3.3 10e3/uL   MONO# 0.5  0.1 - 0.9 10e3/uL   Eosinophils Absolute 0.1  0.0 - 0.5 10e3/uL   Basophils Absolute 0.0  0.0 - 0.1 10e3/uL   NEUT% 87.4 (*) 38.4 - 76.8 %   LYMPH% 6.6 (*) 14.0 - 49.7 %   MONO% 4.9  0.0 - 14.0 %   EOS% 0.8  0.0 - 7.0 %   BASO% 0.3  0.0 - 2.0 %   nRBC 0  0 - 0 %   Retic % 1.89  0.70 - 2.10 %   Retic Ct Abs 75.22  33.70 - 90.70 10e3/uL   Immature Retic Fract 2.30  1.60 - 10.00 %  FERRITIN [facility]     Status: None   Collection Time    09/05/13  2:43 PM      Result Value Ref Range   Ferritin 140  9 - 269 ng/ml  IRON AND TIBC [facility]     Status: None   Collection Time    09/05/13  2:43 PM      Result Value Ref Range   Iron 118  41 - 142 ug/dL   TIBC 288  236 - 444 ug/dL   UIBC 170  120 - 384 ug/dL   %SAT 41  21 - 57 %    Lab Results  Component Value Date   WBC 10.7* 09/05/2013   HGB 12.5 09/05/2013   HCT 37.3 09/05/2013   MCV 93.7 09/05/2013   PLT 200 09/05/2013    RADIOGRAPHIC STUDIES: I reviewed the results of her recent mammogram which was reported as negative. I have personally reviewed the radiological images as listed and agreed with the findings in the report.  ASSESSMENT & PLAN:  #1 chronic GI bleed with iron deficiency, resolved Her anemia has  resolved. Iron studies are satisfactory. I recommend she has recheck CBC and ferritin with her primary care physician in 6 months. I will see her back in one year with repeat same blood work. 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 Breast exam revealed a retracted left nipple and a large lump at the site of the lumpectomy scar. I will continue yearly mammogram, history and physical examination. #5 history of sarcoma No evidence of recurrence #6 history of B12 deficiency B12 level is pending. I will call the patient with test results. All questions were answered. The patient knows to call the clinic with any problems, questions or concerns. No barriers to learning was detected.  I spent 15 minutes counseling the patient face to face. The total time spent in the appointment was 20 minutes and more than 50% was on counseling.     [facility], [place], MD 09/05/2013 4:38 PM

## 2013-09-06 ENCOUNTER — Encounter: Payer: Self-pay | Admitting: Internal Medicine

## 2013-09-06 ENCOUNTER — Ambulatory Visit (INDEPENDENT_AMBULATORY_CARE_PROVIDER_SITE_OTHER): Payer: Medicare Other

## 2013-09-06 DIAGNOSIS — I4891 Unspecified atrial fibrillation: Secondary | ICD-10-CM

## 2013-09-06 LAB — MDC_IDC_ENUM_SESS_TYPE_REMOTE
Battery Voltage: 2.7 V
Brady Statistic AP VP Percent: 4.5 %
Brady Statistic AS VP Percent: 95.4 %
Brady Statistic AS VS Percent: 0.1 %
Lead Channel Pacing Threshold Amplitude: 1.125 V
Lead Channel Pacing Threshold Pulse Width: 0.4 ms
Lead Channel Sensing Intrinsic Amplitude: 1.4 mV
Lead Channel Setting Pacing Amplitude: 2 V
Lead Channel Setting Pacing Amplitude: 2.5 V
Lead Channel Setting Pacing Pulse Width: 0.4 ms
MDC IDC MSMT BATTERY REMAINING LONGEVITY: 16 mo
MDC IDC MSMT LEADCHNL RA IMPEDANCE VALUE: 438 Ohm
MDC IDC MSMT LEADCHNL RA PACING THRESHOLD PULSEWIDTH: 0.4 ms
MDC IDC MSMT LEADCHNL RV IMPEDANCE VALUE: 657 Ohm
MDC IDC MSMT LEADCHNL RV PACING THRESHOLD AMPLITUDE: 0.625 V
MDC IDC SET LEADCHNL RV SENSING SENSITIVITY: 2.8 mV
MDC IDC STAT BRADY AP VS PERCENT: 0.1 %

## 2013-09-17 NOTE — Progress Notes (Signed)
PPM remote 

## 2013-09-18 ENCOUNTER — Encounter: Payer: Self-pay | Admitting: *Deleted

## 2013-11-12 ENCOUNTER — Encounter: Payer: 59 | Admitting: Internal Medicine

## 2013-11-13 ENCOUNTER — Emergency Department (HOSPITAL_BASED_OUTPATIENT_CLINIC_OR_DEPARTMENT_OTHER)
Admission: EM | Admit: 2013-11-13 | Discharge: 2013-11-13 | Disposition: A | Discharge status: Home / Self Care | Payer: Medicare Other | Attending: Emergency Medicine | Admitting: Emergency Medicine

## 2013-11-13 ENCOUNTER — Encounter (HOSPITAL_BASED_OUTPATIENT_CLINIC_OR_DEPARTMENT_OTHER): Payer: Self-pay | Admitting: Emergency Medicine

## 2013-11-13 DIAGNOSIS — S81009A Unspecified open wound, unspecified knee, initial encounter: Secondary | ICD-10-CM | POA: Insufficient documentation

## 2013-11-13 DIAGNOSIS — E039 Hypothyroidism, unspecified: Secondary | ICD-10-CM | POA: Insufficient documentation

## 2013-11-13 DIAGNOSIS — Z87440 Personal history of urinary (tract) infections: Secondary | ICD-10-CM | POA: Insufficient documentation

## 2013-11-13 DIAGNOSIS — G4700 Insomnia, unspecified: Secondary | ICD-10-CM | POA: Insufficient documentation

## 2013-11-13 DIAGNOSIS — F0390 Unspecified dementia without behavioral disturbance: Secondary | ICD-10-CM | POA: Insufficient documentation

## 2013-11-13 DIAGNOSIS — M1990 Unspecified osteoarthritis, unspecified site: Secondary | ICD-10-CM | POA: Insufficient documentation

## 2013-11-13 DIAGNOSIS — K219 Gastro-esophageal reflux disease without esophagitis: Secondary | ICD-10-CM | POA: Insufficient documentation

## 2013-11-13 DIAGNOSIS — S91009A Unspecified open wound, unspecified ankle, initial encounter: Principal | ICD-10-CM

## 2013-11-13 DIAGNOSIS — I10 Essential (primary) hypertension: Secondary | ICD-10-CM | POA: Insufficient documentation

## 2013-11-13 DIAGNOSIS — Z8619 Personal history of other infectious and parasitic diseases: Secondary | ICD-10-CM | POA: Insufficient documentation

## 2013-11-13 DIAGNOSIS — Y9289 Other specified places as the place of occurrence of the external cause: Secondary | ICD-10-CM | POA: Insufficient documentation

## 2013-11-13 DIAGNOSIS — Y9389 Activity, other specified: Secondary | ICD-10-CM | POA: Insufficient documentation

## 2013-11-13 DIAGNOSIS — Z853 Personal history of malignant neoplasm of breast: Secondary | ICD-10-CM | POA: Insufficient documentation

## 2013-11-13 DIAGNOSIS — W268XXA Contact with other sharp object(s), not elsewhere classified, initial encounter: Secondary | ICD-10-CM | POA: Insufficient documentation

## 2013-11-13 DIAGNOSIS — Z8701 Personal history of pneumonia (recurrent): Secondary | ICD-10-CM | POA: Insufficient documentation

## 2013-11-13 DIAGNOSIS — Z23 Encounter for immunization: Secondary | ICD-10-CM | POA: Insufficient documentation

## 2013-11-13 DIAGNOSIS — J449 Chronic obstructive pulmonary disease, unspecified: Secondary | ICD-10-CM | POA: Insufficient documentation

## 2013-11-13 DIAGNOSIS — J4489 Other specified chronic obstructive pulmonary disease: Secondary | ICD-10-CM | POA: Insufficient documentation

## 2013-11-13 DIAGNOSIS — Z79899 Other long term (current) drug therapy: Secondary | ICD-10-CM | POA: Insufficient documentation

## 2013-11-13 DIAGNOSIS — Z950 Presence of cardiac pacemaker: Secondary | ICD-10-CM | POA: Insufficient documentation

## 2013-11-13 DIAGNOSIS — Z862 Personal history of diseases of the blood and blood-forming organs and certain disorders involving the immune mechanism: Secondary | ICD-10-CM | POA: Insufficient documentation

## 2013-11-13 DIAGNOSIS — Z8679 Personal history of other diseases of the circulatory system: Secondary | ICD-10-CM | POA: Insufficient documentation

## 2013-11-13 DIAGNOSIS — S81809A Unspecified open wound, unspecified lower leg, initial encounter: Principal | ICD-10-CM

## 2013-11-13 DIAGNOSIS — S81811A Laceration without foreign body, right lower leg, initial encounter: Secondary | ICD-10-CM

## 2013-11-13 DIAGNOSIS — IMO0002 Reserved for concepts with insufficient information to code with codable children: Secondary | ICD-10-CM | POA: Insufficient documentation

## 2013-11-13 DIAGNOSIS — Z8589 Personal history of malignant neoplasm of other organs and systems: Secondary | ICD-10-CM | POA: Insufficient documentation

## 2013-11-13 MED ORDER — TETANUS-DIPHTH-ACELL PERTUSSIS 5-2.5-18.5 LF-MCG/0.5 IM SUSP
0.5000 mL | Freq: Once | INTRAMUSCULAR | Status: AC
  Administered 2013-11-13: 0.5 mL via INTRAMUSCULAR
  Filled 2013-11-13: qty 0.5

## 2013-11-13 NOTE — ED Notes (Signed)
Cut right shin on screw-skin tear noted-no bleeding noted

## 2013-11-13 NOTE — Discharge Instructions (Signed)
Skin Tear Care  A skin tear is a wound in which the top layer of skin has peeled off. This is a common problem with aging because the skin becomes thinner and more fragile as a person gets older. In addition, some medicines, such as oral corticosteroids, can lead to skin thinning if taken for long periods of time.   A skin tear is often repaired with tape or skin adhesive strips. This keeps the skin that has been peeled off in contact with the healthier skin beneath. Depending on the location of the wound, a bandage (dressing) may be applied over the tape or skin adhesive strips. Sometimes, during the healing process, the skin turns black and dies. Even when this happens, the torn skin acts as a good dressing until the skin underneath gets healthier and repairs itself.  HOME CARE INSTRUCTIONS   · Change dressings once per day or as directed by your caregiver.  · Gently clean the skin tear and the area around the tear using saline solution or mild soap and water.  · Do not rub the injured skin dry. Let the area air dry.  · Apply petroleum jelly or an antibiotic cream or ointment to keep the tear moist. This will help the wound heal. Do not allow a scab to form.  · If the dressing sticks before the next dressing change, moisten it with warm soapy water and gently remove it.  · Protect the injured skin until it has healed.  · Only take over-the-counter or prescription medicines as directed by your caregiver.  · Take showers or baths using warm soapy water. Apply a new dressing after the shower or bath.  · Keep all follow-up appointments as directed by your caregiver.    SEEK IMMEDIATE MEDICAL CARE IF:   · You have redness, swelling, or increasing pain in the skin tear.  · You have pus coming from the skin tear.  · You have chills.  · You have a red streak that goes away from the skin tear.  · You have a bad smell coming from the tear or dressing.  · You have a fever or persistent symptoms for more than 2 3 days.  · You  have a fever and your symptoms suddenly get worse.  MAKE SURE YOU:  · Understand these instructions.  · Will watch this condition.  · Will get help right away if your child is not doing well or gets worse.  Document Released: 03/01/2001 Document Revised: 02/29/2012 Document Reviewed: 12/19/2011  [employer]® Patient Information ©2014 [employer].

## 2013-11-13 NOTE — ED Provider Notes (Signed)
CSN: [id number]     Arrival date & time 11/13/13  1313 History   First MD Initiated Contact with Patient 11/13/13 1403     Chief Complaint  Patient presents with  . Leg Injury     (Consider location/radiation/quality/duration/timing/severity/associated sxs/prior Treatment) Patient is a 80 y.o. female presenting with skin laceration.  Laceration Location:  Leg Leg laceration location:  R lower leg Length (cm):  4 Depth:  Through dermis Quality: avulsion   Bleeding: controlled   Time since incident: just PTA. Injury mechanism: scraped it on a screw. Pain details:    Quality:  Sharp   Severity:  Mild   Timing:  Constant   Progression:  Unchanged Foreign body present:  No foreign bodies Relieved by:  Pressure Tetanus status:  Unknown   Past Medical History  Diagnosis Date  . Insomnia, unspecified   . Osteoarthrosis, unspecified whether generalized or localized, unspecified site   . Unspecified hypothyroidism   . Unspecified essential hypertension   . Atrial fibrillation   . Sciatica   . Anemia   . COPD (chronic obstructive pulmonary disease)   . UTI (urinary tract infection)   . GERD (gastroesophageal reflux disease)   . Pneumonia   . Asthma   . Clostridium difficile colitis   . Diverticulitis of sigmoid colon 11/2010    with abscess/contained perforation  . Dementia     mild  . Dehydration with hyponatremia 12/2010    hospitalized  . Angiodysplasia of stomach     ablated 2010  . Angiodysplasia of colon     ablated 2010  . Vitamin B12 deficiency (dietary) anemia 04/12/2011  . Shingles   . Sarcoma     right hand  . Malignant neoplasm of breast (female), unspecified site     left  . Dupuytren's disease     left hand  . Vitamin B12 deficiency 04/02/2012  . Complication of anesthesia     " very cautious because of my COPD"  . Pacemaker    Past Surgical History  Procedure Laterality Date  . Nasal sinus surgery      x 3  . Appendectomy    . Hernia repair     . Abdominal hysterectomy    . Pacemaker insertion    . Eye surgery      x6-bilateral  . Finger amputation      right  . Foot surgery      right toes x 3  . Tonsillectomy    . Colonoscopy  01/2009    w/ablation of 3 mm AVM, diverticulosis, external hemorrhoids  . Upper gastrointestinal endoscopy  01/2009    w/polypectomy, AVM, hyperplastic polyp  . Abdominal hysterectomy    . Laparotomy  11/12/2011    Procedure: EXPLORATORY LAPAROTOMY;  Surgeon: Madilyn Hook, DO;  Location: [facility];  Service: General;  Laterality: N/A;  . Colostomy revision  11/12/2011    Procedure: COLON RESECTION SIGMOID;  Surgeon: Madilyn Hook, DO;  Location: [facility];  Service: General;  Laterality: N/A;  . Colostomy  11/12/2011    Procedure: COLOSTOMY;  Surgeon: Madilyn Hook, DO;  Location: [facility];  Service: General;  Laterality: Left;  End colostomy  . Laparotomy  11/19/2011    Procedure: EXPLORATORY LAPAROTOMY;  Surgeon: Shann Medal, MD;  Location: [facility];  Service: General;  Laterality: N/A;  . Gastrostomy  11/19/2011    Procedure: GASTROSTOMY;  Surgeon: Shann Medal, MD;  Location: [facility];  Service: General;;  INSERTION GASTROSTOMY TUBE  . Application  of wound vac  11/19/2011    Procedure: APPLICATION OF WOUND VAC;  Surgeon: Shann Medal, MD;  Location: [facility];  Service: General;  Laterality: N/A;   Family History  Problem Relation Age of Onset  . Emphysema Paternal Uncle   . Heart disease Mother   . Colon cancer Mother   . Pancreatic cancer Father   . Cancer Father 71    pancreatic cancer  . Tuberculosis Paternal Uncle   . Breast cancer Maternal Aunt   . Breast cancer Cousin   . Prostate cancer Cousin   . Colon cancer Cousin   . Uterine cancer Cousin    History  Substance Use Topics  . Smoking status: Former Smoker -- 0.50 packs/day for 21 years    Types: Cigarettes    Quit date: 06/20/1977  . Smokeless tobacco: Never Used  . Alcohol Use: Yes     Comment: 1 glass wine/night   OB History   Grav  Para Term Preterm Abortions TAB SAB Ect Mult Living                 Review of Systems  All other systems reviewed and are negative.     Allergies  Aspirin; Sulfonamide derivatives; Ibuprofen; Iron dextran; and Metoclopramide hcl  Home Medications   Prior to Admission medications   Medication Sig Start Date End Date Taking? Authorizing Provider  albuterol (PROVENTIL HFA;VENTOLIN HFA) 108 (90 BASE) MCG/ACT inhaler Inhale 2 puffs into the lungs 2 (two) times daily.    Historical Provider, MD  albuterol (PROVENTIL) (2.5 MG/3ML) 0.083% nebulizer solution Take 2.5 mg by nebulization every 4 (four) hours as needed for wheezing or shortness of breath.    Historical Provider, MD  ALPRAZolam Duanne Moron) 0.25 MG tablet Take 0.25 mg by mouth every 4 (four) hours as needed for anxiety.    Historical Provider, MD  baclofen (LIORESAL) 10 MG tablet Take 10 mg by mouth every 8 (eight) hours as needed.    Historical Provider, MD  beclomethasone (QVAR) 80 MCG/ACT inhaler Inhale 3 puffs into the lungs 3 (three) times daily.     Historical Provider, MD  Budesonide (RHINOCORT AQUA NA) Place 1 spray into the nose daily.     Historical Provider, MD  chlorpheniramine-HYDROcodone (TUSSIONEX) 10-8 MG/5ML LQCR Take 5 mLs by mouth at bedtime as needed (for cough).    Historical Provider, MD  dexlansoprazole (DEXILANT) 60 MG capsule Take 60 mg by mouth daily before breakfast.    Historical Provider, MD  DULoxetine (CYMBALTA) 30 MG capsule Take 30 mg by mouth daily.    Historical Provider, MD  EPINEPHrine (EPIPEN IJ) Inject as directed.    Historical Provider, MD  ESZOPICLONE 3 MG tablet  04/29/13   Historical Provider, MD  furosemide (LASIX) 20 MG tablet Take 20 mg by mouth daily as needed. For fluid retention. 11/16/12   Delfina Redwood, MD  hydroxypropyl methylcellulose (ISOPTO TEARS) 2.5 % ophthalmic solution Place 2 drops into both eyes 2 (two) times daily.    Historical Provider, MD  levothyroxine (SYNTHROID,  LEVOTHROID) 50 MCG tablet Take 50 mcg by mouth daily before breakfast.  12/01/11 05/14/13  Radene Gunning, NP  losartan (COZAAR) 50 MG tablet Take 50 mg by mouth daily.    Historical Provider, MD  metoprolol succinate (TOPROL-XL) 50 MG 24 hr tablet TAKE 1 TABLET BY MOUTH EVERY DAY 06/17/13   Evans Lance, MD  mirtazapine (REMERON) 30 MG tablet Take 15 mg by mouth at bedtime.  Historical Provider, MD  Multiple Vitamin (MULTIVITAMIN WITH MINERALS) TABS Take 1 tablet by mouth daily.    Historical Provider, MD  ondansetron (ZOFRAN) 4 MG tablet Take 4 mg by mouth every 6 (six) hours as needed for nausea.    Historical Provider, MD  oxyCODONE (OXY IR/ROXICODONE) 5 MG immediate release tablet Take 5-10 mg by mouth every 4 (four) hours as needed (for cough / pain).     Historical Provider, MD  polyethylene glycol (MIRALAX / GLYCOLAX) packet Take 17 g by mouth daily.    Historical Provider, MD  potassium chloride SA (K-DUR,KLOR-CON) 10 MEQ tablet Take 1 tablet (10 mEq total) by mouth daily. 11/16/12   Delfina Redwood, MD  pravastatin (PRAVACHOL) 40 MG tablet Take 40 mg by mouth daily.    Historical Provider, MD  predniSONE (DELTASONE) 5 MG tablet Take 7.5 mg by mouth daily. 7.5 mg every other day alternate with 5 mg every other day 11/16/12   Delfina Redwood, MD  promethazine (PHENERGAN) 25 MG tablet Take 25 mg by mouth every 6 (six) hours as needed. For nausea    Historical Provider, MD  ranitidine (ZANTAC) 300 MG tablet Take 300 mg by mouth daily.  04/26/13   Historical Provider, MD  senna-docusate (SENOKOT-S) 8.6-50 MG per tablet Take 1 tablet by mouth daily.    Historical Provider, MD  traZODone (DESYREL) 50 MG tablet  07/24/13   Historical Provider, MD  Vitamin D, Ergocalciferol, (DRISDOL) 50000 UNITS CAPS Take 50,000 Units by mouth every 7 (seven) days. On Sundays    Historical Provider, MD  XOLAIR 150 MG injection Inject into the skin every 28 (twenty-eight) days.  04/19/13   Historical Provider,  MD  zafirlukast (ACCOLATE) 20 MG tablet Take 20 mg by mouth 2 (two) times daily.    Historical Provider, MD   BP 128/66  Pulse 87  Temp(Src) 99.4 F (37.4 C) (Oral)  Resp 20  Ht 5\' 5"  (1.651 m)  Wt 176 lb (79.833 kg)  BMI 29.29 kg/m2  SpO2 100% Physical Exam  Nursing note and vitals reviewed. Constitutional: She is oriented to person, place, and time. She appears well-developed and well-nourished. No distress.  HENT:  Head: Normocephalic and atraumatic.  Eyes: Conjunctivae are normal. No scleral icterus.  Neck: Neck supple.  Cardiovascular: Normal rate and intact distal pulses.   Pulmonary/Chest: Effort normal. No stridor. No respiratory distress.  Abdominal: Normal appearance. She exhibits no distension.  Neurological: She is alert and oriented to person, place, and time.  Skin: Skin is warm and dry. No rash noted.     Psychiatric: She has a normal mood and affect. Her behavior is normal.    ED Course  Procedures (including critical care time) Labs Review Labs Reviewed - No data to display  Imaging Review No results found.   EKG Interpretation None      MDM   Final diagnoses:  Laceration of right lower leg    80 yo female with a skin tear.  Uncomplicated.  NV intact distally.  She has been on long term prednisone, has thin skin.  Don't think suturing would be beneficial.  Will place tegaderm.  tdap given.  DC with return precautions.     Houston Siren III, MD 11/13/13 [phone]

## 2013-12-17 ENCOUNTER — Ambulatory Visit (INDEPENDENT_AMBULATORY_CARE_PROVIDER_SITE_OTHER): Payer: Medicare Other | Admitting: Internal Medicine

## 2013-12-17 ENCOUNTER — Encounter: Payer: Self-pay | Admitting: Internal Medicine

## 2013-12-17 VITALS — BP 149/79 | HR 101 | Ht 64.0 in | Wt 175.8 lb

## 2013-12-17 DIAGNOSIS — Z950 Presence of cardiac pacemaker: Secondary | ICD-10-CM

## 2013-12-17 DIAGNOSIS — I4891 Unspecified atrial fibrillation: Secondary | ICD-10-CM

## 2013-12-17 DIAGNOSIS — I480 Paroxysmal atrial fibrillation: Secondary | ICD-10-CM

## 2013-12-17 DIAGNOSIS — I498 Other specified cardiac arrhythmias: Secondary | ICD-10-CM

## 2013-12-17 LAB — MDC_IDC_ENUM_SESS_TYPE_INCLINIC
Battery Impedance: 2970 Ohm
Battery Remaining Longevity: 15 mo
Brady Statistic AP VP Percent: 8 %
Brady Statistic AS VS Percent: 0 %
Date Time Interrogation Session: 20150630161637
Lead Channel Pacing Threshold Pulse Width: 0.4 ms
Lead Channel Pacing Threshold Pulse Width: 0.4 ms
Lead Channel Setting Pacing Amplitude: 2.25 V
Lead Channel Setting Pacing Amplitude: 2.5 V
Lead Channel Setting Pacing Pulse Width: 0.4 ms
Lead Channel Setting Sensing Sensitivity: 2.8 mV
MDC IDC MSMT BATTERY VOLTAGE: 2.68 V
MDC IDC MSMT LEADCHNL RA IMPEDANCE VALUE: 440 Ohm
MDC IDC MSMT LEADCHNL RA PACING THRESHOLD AMPLITUDE: 0.75 V
MDC IDC MSMT LEADCHNL RA SENSING INTR AMPL: 4 mV
MDC IDC MSMT LEADCHNL RV IMPEDANCE VALUE: 622 Ohm
MDC IDC MSMT LEADCHNL RV PACING THRESHOLD AMPLITUDE: 0.5 V
MDC IDC STAT BRADY AP VS PERCENT: 0 %
MDC IDC STAT BRADY AS VP PERCENT: 92 %

## 2013-12-17 NOTE — Assessment & Plan Note (Signed)
Her atrial fibrillation has been well controlled. She has been in NSR over 99% of the time.

## 2013-12-17 NOTE — Progress Notes (Signed)
HPI [name] returns today for followup. She is a very pleasant 80 year old woman with a history of complete heart block, status post permanent pacemaker insertion, severe lung disease, requiring nocturnal oxygen, paroxysmal atrial fibrillation,  Hypertension, who is status post colon surgery for diverticulitis, complicated by wound dehiscence. She has a colostomy tube in place. The patient says when he developed a large abdominal hernia. She is now in assisted living which appears to be working out for her. Her heart failure symptoms have improved as has her pulmonary problems. Her main complaint is related to her abdominal difficulties. It appears she will have an indwelling ostomy.  Allergies  Allergen Reactions  . Aspirin Shortness Of Breath    Kicks up your asthma  . Sulfonamide Derivatives Hives  . Ibuprofen Other (See Comments)    Doesn't take because of her aspirin allergy  . Iron Dextran Itching     itching after test dose  . Metoclopramide Hcl Other (See Comments)    REACTION: chomping mouth     Current Outpatient Prescriptions  Medication Sig Dispense Refill  . albuterol (PROVENTIL HFA;VENTOLIN HFA) 108 (90 BASE) MCG/ACT inhaler Inhale 2 puffs into the lungs 2 (two) times daily.      [name] albuterol (PROVENTIL) (2.5 MG/3ML) 0.083% nebulizer solution Take 2.5 mg by nebulization every 4 (four) hours as needed for wheezing or shortness of breath.      . ALPRAZolam (XANAX) 0.25 MG tablet Take 0.25 mg by mouth every 4 (four) hours as needed for anxiety.      . baclofen (LIORESAL) 10 MG tablet Take 10 mg by mouth every 8 (eight) hours as needed.      . beclomethasone (QVAR) 80 MCG/ACT inhaler Inhale 3 puffs into the lungs 3 (three) times daily.       . Budesonide (RHINOCORT AQUA NA) Place 1 spray into the nose daily.       . chlorpheniramine-HYDROcodone (TUSSIONEX) 10-8 MG/5ML LQCR Take 5 mLs by mouth at bedtime as needed (for cough).      [name] dexlansoprazole (DEXILANT) 60 MG  capsule Take 60 mg by mouth daily before breakfast.      . DULoxetine (CYMBALTA) 30 MG capsule Take 30 mg by mouth daily.      [name] EPINEPHrine (EPIPEN IJ) Inject as directed.      [name] ESZOPICLONE 3 MG tablet       . furosemide (LASIX) 20 MG tablet Take 20 mg by mouth daily as needed. For fluid retention.      [name] levothyroxine (SYNTHROID, LEVOTHROID) 50 MCG tablet Take 50 mcg by mouth daily before breakfast.       . losartan (COZAAR) 50 MG tablet Take 50 mg by mouth daily.      . metoprolol succinate (TOPROL-XL) 50 MG 24 hr tablet TAKE 1 TABLET BY MOUTH EVERY DAY  90 tablet  1  . mirtazapine (REMERON) 30 MG tablet Take 15 mg by mouth at bedtime.       . Multiple Vitamin (MULTIVITAMIN WITH MINERALS) TABS Take 1 tablet by mouth daily.      . ondansetron (ZOFRAN) 4 MG tablet Take 4 mg by mouth every 6 (six) hours as needed for nausea.      [name] oxyCODONE (OXY IR/ROXICODONE) 5 MG immediate release tablet Take 5-10 mg by mouth every 4 (four) hours as needed (for cough / pain).       . polyethylene glycol (MIRALAX / GLYCOLAX) packet Take 17 g by mouth daily.      [name]  potassium chloride SA (K-DUR,KLOR-CON) 10 MEQ tablet Take 1 tablet (10 mEq total) by mouth daily.  30 tablet  0  . pravastatin (PRAVACHOL) 40 MG tablet Take 40 mg by mouth daily.      . predniSONE (DELTASONE) 5 MG tablet Take 7.5 mg by mouth daily. 7.5 mg every other day alternate with 5 mg every other day      . promethazine (PHENERGAN) 25 MG tablet Take 25 mg by mouth every 6 (six) hours as needed. For nausea      . ranitidine (ZANTAC) 300 MG tablet Take 300 mg by mouth daily.       [name] senna-docusate (SENOKOT-S) 8.6-50 MG per tablet Take 1 tablet by mouth daily.      . traZODone (DESYREL) 50 MG tablet       . Vitamin D, Ergocalciferol, (DRISDOL) 50000 UNITS CAPS Take 50,000 Units by mouth every 7 (seven) days. On      . XOLAIR 150 MG injection Inject into the skin every 28 (twenty-eight) days.       . zafirlukast (ACCOLATE) 20 MG tablet Take 20 mg  by mouth 2 (two) times daily.       No current facility-administered medications for this visit.     Past Medical History  Diagnosis Date  . Insomnia, unspecified   . Osteoarthrosis, unspecified whether generalized or localized, unspecified site   . Unspecified hypothyroidism   . Unspecified essential hypertension   . Atrial fibrillation   . Sciatica   . Anemia   . COPD (chronic obstructive pulmonary disease)   . UTI (urinary tract infection)   . GERD (gastroesophageal reflux disease)   . Pneumonia   . Asthma   . Clostridium difficile colitis   . Diverticulitis of sigmoid colon 11/2010    with abscess/contained perforation  . Dementia     mild  . Dehydration with hyponatremia 12/2010    hospitalized  . Angiodysplasia of stomach     ablated 2010  . Angiodysplasia of colon     ablated 2010  . Vitamin B12 deficiency (dietary) anemia 04/12/2011  . Shingles   . Sarcoma     right hand  . Malignant neoplasm of breast (female), unspecified site     left  . Dupuytren's disease     left hand  . Vitamin B12 deficiency 04/02/2012  . Complication of anesthesia     " very cautious because of my COPD"  . Pacemaker     ROS:   All systems reviewed and negative except as noted in the HPI.   Past Surgical History  Procedure Laterality Date  . Nasal sinus surgery      x 3  . Appendectomy    . Hernia repair    . Abdominal hysterectomy    . Pacemaker insertion    . Eye surgery      x6-bilateral  . Finger amputation      right  . Foot surgery      right toes x 3  . Tonsillectomy    . Colonoscopy  01/2009    w/ablation of 3 mm AVM, diverticulosis, external hemorrhoids  . Upper gastrointestinal endoscopy  01/2009    w/polypectomy, AVM, hyperplastic polyp  . Abdominal hysterectomy    . Laparotomy  11/12/2011    Procedure: EXPLORATORY LAPAROTOMY;  Surgeon: Madilyn Hook, DO;  Location: [facility];  Service: General;  Laterality: N/A;  . Colostomy revision  11/12/2011    Procedure:  COLON RESECTION SIGMOID;  Surgeon: Madilyn Hook,  DO;  Location: [facility];  Service: General;  Laterality: N/A;  . Colostomy  11/12/2011    Procedure: COLOSTOMY;  Surgeon: Madilyn Hook, DO;  Location: [facility];  Service: General;  Laterality: Left;  End colostomy  . Laparotomy  11/19/2011    Procedure: EXPLORATORY LAPAROTOMY;  Surgeon: Shann Medal, MD;  Location: [facility];  Service: General;  Laterality: N/A;  . Gastrostomy  11/19/2011    Procedure: GASTROSTOMY;  Surgeon: Shann Medal, MD;  Location: [facility];  Service: General;;  INSERTION GASTROSTOMY TUBE  . Application of wound vac  11/19/2011    Procedure: APPLICATION OF WOUND VAC;  Surgeon: Shann Medal, MD;  Location: [facility];  Service: General;  Laterality: N/A;     Family History  Problem Relation Age of Onset  . Emphysema Paternal Uncle   . Heart disease Mother   . Colon cancer Mother   . Pancreatic cancer Father   . Cancer Father 1    pancreatic cancer  . Tuberculosis Paternal Uncle   . Breast cancer Maternal Aunt   . Breast cancer Cousin   . Prostate cancer Cousin   . Colon cancer Cousin   . Uterine cancer Cousin      History   Social History  . Marital Status: Widowed    Spouse Name: N/A    Number of Children: 3  . Years of Education: N/A   Occupational History  . Home Maker    Social History Main Topics  . Smoking status: Former Smoker -- 0.50 packs/day for 21 years    Types: Cigarettes    Quit date: 06/20/1977  . Smokeless tobacco: Never Used  . Alcohol Use: Yes     Comment: 1 glass wine/night  . Drug Use: No  . Sexual Activity: Not on file   Other Topics Concern  . Not on file   Social History Narrative  . No narrative on file     There were no vitals taken for this visit.  Physical Exam:  Chronically ill appearing 80 year old woman, NAD HEENT: Unremarkable Neck:  6 cm JVD, no thyromegally Back:  No CVA tenderness Lungs:  Clear with no wheezes, rales, or rhonchi. Well-healed pacemaker  incision HEART:  Regular rate rhythm, no murmurs, no rubs, no clicks Abd:  soft, positive bowel sounds, no organomegally, no rebound, no guarding, large hernia is present. Ext:  2 plus pulses, no edema, no cyanosis, no clubbing Skin:  No rashes no nodules Neuro:  CN II through XII intact, motor grossly intact  DEVICE  Normal device function.  See PaceArt for details.   Assess/Plan:

## 2013-12-17 NOTE — Assessment & Plan Note (Signed)
Her [employer] DDD PM is working normally. She has approximately 1.5 years of longevity on her device.

## 2013-12-17 NOTE — Patient Instructions (Addendum)
Please send [employer] Smart transmission upon arrival of the monitor. Then complete survey. Send second survey one month after the first transmission is sent. Then complete second survey.  Remote monitoring is used to monitor your pacemaker from home. This monitoring reduces the number of office visits required to check your device to one time per year. It allows [place] to keep an eye on the functioning of your device to ensure it is working properly. You are scheduled for a device check from home on 03-19-2014. You may send your transmission at any time that day. If you have a wireless device, the transmission will be sent automatically. After your physician reviews your transmission, you will receive a postcard with your next transmission date.  Your physician recommends that you schedule a follow-up appointment in: 12 months with Dr.Taylor

## 2013-12-28 ENCOUNTER — Other Ambulatory Visit: Payer: Self-pay | Admitting: Internal Medicine

## 2013-12-31 ENCOUNTER — Ambulatory Visit: Payer: 59 | Admitting: *Deleted

## 2013-12-31 ENCOUNTER — Telehealth: Payer: Self-pay | Admitting: Cardiology

## 2013-12-31 NOTE — Telephone Encounter (Signed)
LMOVM reminding pt to send remote transmission.   

## 2014-01-01 ENCOUNTER — Encounter: Payer: Self-pay | Admitting: Cardiology

## 2014-01-02 NOTE — Progress Notes (Signed)
Remote pacemaker transmission.   

## 2014-01-08 ENCOUNTER — Telehealth: Payer: Self-pay | Admitting: Internal Medicine

## 2014-01-08 NOTE — Telephone Encounter (Signed)
New message           Pt would like to know if you received her last transmission

## 2014-01-08 NOTE — Telephone Encounter (Signed)
Spoke with and informed pt that we did receive the transmission. Pt verbalized understanding.

## 2014-02-05 ENCOUNTER — Telehealth: Payer: Self-pay | Admitting: Internal Medicine

## 2014-02-05 NOTE — Telephone Encounter (Signed)
New message   Patient has questions regarding results & she receive a return to sender box in the mail.  Would like to discuss.

## 2014-02-06 NOTE — Telephone Encounter (Signed)
Spoke w/pt and answered all questions about return kit and next transmission scheduled.

## 2014-03-19 ENCOUNTER — Encounter: Payer: Self-pay | Admitting: Internal Medicine

## 2014-03-19 ENCOUNTER — Ambulatory Visit (INDEPENDENT_AMBULATORY_CARE_PROVIDER_SITE_OTHER): Payer: Medicare Other | Admitting: *Deleted

## 2014-03-19 DIAGNOSIS — I4891 Unspecified atrial fibrillation: Secondary | ICD-10-CM

## 2014-03-19 DIAGNOSIS — Z950 Presence of cardiac pacemaker: Secondary | ICD-10-CM

## 2014-03-19 DIAGNOSIS — I480 Paroxysmal atrial fibrillation: Secondary | ICD-10-CM

## 2014-03-19 LAB — MDC_IDC_ENUM_SESS_TYPE_REMOTE
Brady Statistic AP VP Percent: 9 %
Brady Statistic AP VS Percent: 0 %
Brady Statistic AS VP Percent: 91 %
Brady Statistic AS VS Percent: 0 %
Date Time Interrogation Session: 20151001163809
Lead Channel Impedance Value: 433 Ohm
Lead Channel Impedance Value: 637 Ohm
Lead Channel Pacing Threshold Amplitude: 1 V
Lead Channel Pacing Threshold Pulse Width: 0.4 ms
Lead Channel Sensing Intrinsic Amplitude: 2.8 mV
Lead Channel Setting Pacing Amplitude: 2.5 V
Lead Channel Setting Sensing Sensitivity: 2.8 mV
MDC IDC MSMT BATTERY IMPEDANCE: 3478 Ohm
MDC IDC MSMT BATTERY REMAINING LONGEVITY: 11 mo
MDC IDC MSMT BATTERY VOLTAGE: 2.68 V
MDC IDC MSMT LEADCHNL RV PACING THRESHOLD AMPLITUDE: 0.75 V
MDC IDC MSMT LEADCHNL RV PACING THRESHOLD PULSEWIDTH: 0.4 ms
MDC IDC SET LEADCHNL RA PACING AMPLITUDE: 2 V
MDC IDC SET LEADCHNL RV PACING PULSEWIDTH: 0.4 ms

## 2014-03-20 ENCOUNTER — Telehealth: Payer: Self-pay | Admitting: Cardiology

## 2014-03-20 NOTE — Telephone Encounter (Signed)
Spoke with pt and reminded pt of remote transmission that is due today. Pt verbalized understanding.   

## 2014-03-20 NOTE — Progress Notes (Signed)
Remote pacemaker transmission.   

## 2014-04-04 ENCOUNTER — Encounter: Payer: Self-pay | Admitting: Cardiology

## 2014-04-30 ENCOUNTER — Other Ambulatory Visit: Payer: Self-pay | Admitting: Hematology and Oncology

## 2014-06-23 ENCOUNTER — Telehealth: Payer: Self-pay | Admitting: Internal Medicine

## 2014-06-23 NOTE — Telephone Encounter (Signed)
New message      Pt has a pacemaker.  She had an episode on 05-31-14 at 6:45pm.  She was going to a church service.  All of the suddenly she had to sit down---she did not feel fainty or dizzy, but it was like her eyes were not focusing.  She want someone to look at her reading when she does a remote check tomorrow an see if anything showed up for 05-31-14 ar 6:45

## 2014-06-24 ENCOUNTER — Ambulatory Visit (INDEPENDENT_AMBULATORY_CARE_PROVIDER_SITE_OTHER): Payer: Medicare Other | Admitting: *Deleted

## 2014-06-24 DIAGNOSIS — I480 Paroxysmal atrial fibrillation: Secondary | ICD-10-CM

## 2014-06-24 NOTE — Telephone Encounter (Signed)
LMOVM reminding pt to send remote transmission.   

## 2014-06-25 DIAGNOSIS — I480 Paroxysmal atrial fibrillation: Secondary | ICD-10-CM

## 2014-06-25 LAB — MDC_IDC_ENUM_SESS_TYPE_REMOTE
Battery Remaining Longevity: 9 mo
Brady Statistic AP VP Percent: 10 %
Date Time Interrogation Session: 20160106141907
Lead Channel Impedance Value: 661 Ohm
Lead Channel Pacing Threshold Amplitude: 0.625 V
Lead Channel Pacing Threshold Amplitude: 1 V
Lead Channel Pacing Threshold Pulse Width: 0.4 ms
Lead Channel Pacing Threshold Pulse Width: 0.4 ms
Lead Channel Setting Pacing Amplitude: 2 V
Lead Channel Setting Pacing Amplitude: 2.5 V
Lead Channel Setting Pacing Pulse Width: 0.4 ms
Lead Channel Setting Sensing Sensitivity: 2.8 mV
MDC IDC MSMT BATTERY IMPEDANCE: 4010 Ohm
MDC IDC MSMT BATTERY VOLTAGE: 2.66 V
MDC IDC MSMT LEADCHNL RA IMPEDANCE VALUE: 425 Ohm
MDC IDC MSMT LEADCHNL RA SENSING INTR AMPL: 2.8 mV
MDC IDC STAT BRADY AP VS PERCENT: 0 %
MDC IDC STAT BRADY AS VP PERCENT: 90 %
MDC IDC STAT BRADY AS VS PERCENT: 0 %

## 2014-06-25 NOTE — Telephone Encounter (Signed)
Informed pt that she did not have any high rate episodes on 12-12. I explained to patient the kinds of episodes that would be recorded in the ppm. Patient voiced understanding. I also explained to her that it is now the time to place her on an accelerated FU due to her batt longevity. Again, patient voiced understanding.

## 2014-06-25 NOTE — Telephone Encounter (Signed)
Informed pt that when transmission was received I would give to device tech and have her call her back. She verbalized understanding.

## 2014-06-25 NOTE — Progress Notes (Signed)
Remote pacemaker transmission.   

## 2014-06-30 ENCOUNTER — Telehealth: Payer: Self-pay | Admitting: Hematology and Oncology

## 2014-06-30 NOTE — Telephone Encounter (Signed)
s.w. pt and advised on March 21 appt moved to March 24 due to MD on pal...pt ok and aware

## 2014-07-04 ENCOUNTER — Telehealth: Payer: Self-pay | Admitting: Hematology and Oncology

## 2014-07-04 ENCOUNTER — Other Ambulatory Visit: Payer: Self-pay | Admitting: Internal Medicine

## 2014-07-04 NOTE — Telephone Encounter (Signed)
returned call and s.w pt and confirm appts.....pt ok and aware °

## 2014-07-07 ENCOUNTER — Encounter: Payer: Self-pay | Admitting: Cardiology

## 2014-07-21 ENCOUNTER — Encounter: Payer: Self-pay | Admitting: Internal Medicine

## 2014-07-22 ENCOUNTER — Encounter: Payer: Self-pay | Admitting: Cardiology

## 2014-07-28 ENCOUNTER — Encounter: Payer: Self-pay | Admitting: Internal Medicine

## 2014-07-28 ENCOUNTER — Ambulatory Visit (INDEPENDENT_AMBULATORY_CARE_PROVIDER_SITE_OTHER): Payer: Medicare Other | Admitting: *Deleted

## 2014-07-28 ENCOUNTER — Telehealth: Payer: Self-pay | Admitting: Cardiology

## 2014-07-28 DIAGNOSIS — Z950 Presence of cardiac pacemaker: Secondary | ICD-10-CM

## 2014-07-28 LAB — MDC_IDC_ENUM_SESS_TYPE_REMOTE
Battery Remaining Longevity: 8 mo
Battery Voltage: 2.66 V
Brady Statistic AP VS Percent: 0 %
Brady Statistic AS VP Percent: 90 %
Lead Channel Impedance Value: 451 Ohm
Lead Channel Impedance Value: 687 Ohm
Lead Channel Pacing Threshold Amplitude: 0.625 V
Lead Channel Pacing Threshold Amplitude: 1.125 V
Lead Channel Pacing Threshold Pulse Width: 0.4 ms
Lead Channel Sensing Intrinsic Amplitude: 2.8 mV
Lead Channel Setting Pacing Amplitude: 2.25 V
Lead Channel Setting Pacing Amplitude: 2.5 V
Lead Channel Setting Pacing Pulse Width: 0.4 ms
Lead Channel Setting Sensing Sensitivity: 2.8 mV
MDC IDC MSMT BATTERY IMPEDANCE: 4053 Ohm
MDC IDC MSMT LEADCHNL RA PACING THRESHOLD PULSEWIDTH: 0.4 ms
MDC IDC SESS DTM: 20160208200659
MDC IDC STAT BRADY AP VP PERCENT: 10 %
MDC IDC STAT BRADY AS VS PERCENT: 0 %

## 2014-07-28 NOTE — Telephone Encounter (Signed)
LMOVM reminding pt to send remote transmission.   

## 2014-07-28 NOTE — Progress Notes (Signed)
Remote pacemaker transmission.   

## 2014-08-05 ENCOUNTER — Telehealth: Payer: Self-pay | Admitting: Hematology and Oncology

## 2014-08-05 NOTE — Telephone Encounter (Signed)
returned call and s.w. pt and r/s appt ....pt ok and aware of new d.t °

## 2014-08-07 ENCOUNTER — Encounter: Payer: Self-pay | Admitting: Cardiology

## 2014-08-21 ENCOUNTER — Encounter: Payer: Self-pay | Admitting: Cardiology

## 2014-08-28 ENCOUNTER — Ambulatory Visit (INDEPENDENT_AMBULATORY_CARE_PROVIDER_SITE_OTHER): Payer: Medicare Other | Admitting: *Deleted

## 2014-08-28 DIAGNOSIS — Z950 Presence of cardiac pacemaker: Secondary | ICD-10-CM

## 2014-08-28 LAB — MDC_IDC_ENUM_SESS_TYPE_REMOTE
Battery Impedance: 4235 Ohm
Battery Remaining Longevity: 7 mo
Battery Voltage: 2.65 V
Brady Statistic AP VP Percent: 11 %
Brady Statistic AP VS Percent: 0 %
Brady Statistic AS VS Percent: 0 %
Lead Channel Impedance Value: 432 Ohm
Lead Channel Impedance Value: 649 Ohm
Lead Channel Pacing Threshold Amplitude: 0.5 V
Lead Channel Pacing Threshold Amplitude: 1.125 V
Lead Channel Pacing Threshold Pulse Width: 0.4 ms
Lead Channel Sensing Intrinsic Amplitude: 2.8 mV
Lead Channel Setting Pacing Amplitude: 2.5 V
Lead Channel Setting Sensing Sensitivity: 2.8 mV
MDC IDC MSMT LEADCHNL RA PACING THRESHOLD PULSEWIDTH: 0.4 ms
MDC IDC SESS DTM: 20160310152036
MDC IDC SET LEADCHNL RA PACING AMPLITUDE: 2.25 V
MDC IDC SET LEADCHNL RV PACING PULSEWIDTH: 0.4 ms
MDC IDC STAT BRADY AS VP PERCENT: 89 %

## 2014-08-28 NOTE — Progress Notes (Signed)
Remote pacemaker transmission.   

## 2014-09-04 ENCOUNTER — Encounter: Payer: Self-pay | Admitting: Cardiology

## 2014-09-08 ENCOUNTER — Other Ambulatory Visit: Payer: Medicare Other

## 2014-09-08 ENCOUNTER — Ambulatory Visit: Payer: Medicare Other | Admitting: Hematology and Oncology

## 2014-09-09 ENCOUNTER — Other Ambulatory Visit: Payer: Self-pay | Admitting: Hematology and Oncology

## 2014-09-09 DIAGNOSIS — D500 Iron deficiency anemia secondary to blood loss (chronic): Secondary | ICD-10-CM

## 2014-09-10 ENCOUNTER — Ambulatory Visit (HOSPITAL_BASED_OUTPATIENT_CLINIC_OR_DEPARTMENT_OTHER): Payer: Medicare Other | Admitting: Hematology and Oncology

## 2014-09-10 ENCOUNTER — Other Ambulatory Visit (HOSPITAL_BASED_OUTPATIENT_CLINIC_OR_DEPARTMENT_OTHER): Payer: Medicare Other

## 2014-09-10 ENCOUNTER — Encounter: Payer: Self-pay | Admitting: Hematology and Oncology

## 2014-09-10 ENCOUNTER — Encounter: Payer: Self-pay | Admitting: Internal Medicine

## 2014-09-10 ENCOUNTER — Telehealth: Payer: Self-pay | Admitting: Hematology and Oncology

## 2014-09-10 VITALS — BP 155/65 | HR 87 | Temp 97.9°F | Resp 18 | Ht 64.0 in | Wt 174.3 lb

## 2014-09-10 DIAGNOSIS — C50919 Malignant neoplasm of unspecified site of unspecified female breast: Secondary | ICD-10-CM

## 2014-09-10 DIAGNOSIS — Z862 Personal history of diseases of the blood and blood-forming organs and certain disorders involving the immune mechanism: Secondary | ICD-10-CM

## 2014-09-10 DIAGNOSIS — Z853 Personal history of malignant neoplasm of breast: Secondary | ICD-10-CM

## 2014-09-10 DIAGNOSIS — D509 Iron deficiency anemia, unspecified: Secondary | ICD-10-CM

## 2014-09-10 DIAGNOSIS — D500 Iron deficiency anemia secondary to blood loss (chronic): Secondary | ICD-10-CM

## 2014-09-10 LAB — CBC & DIFF AND RETIC
BASO%: 0.7 % (ref 0.0–2.0)
Basophils Absolute: 0.1 10*3/uL (ref 0.0–0.1)
EOS ABS: 0.4 10*3/uL (ref 0.0–0.5)
EOS%: 4.4 % (ref 0.0–7.0)
HEMATOCRIT: 37.6 % (ref 34.8–46.6)
HGB: 12.4 g/dL (ref 11.6–15.9)
IMMATURE RETIC FRACT: 10.6 % — AB (ref 1.60–10.00)
LYMPH#: 2.1 10*3/uL (ref 0.9–3.3)
LYMPH%: 21.9 % (ref 14.0–49.7)
MCH: 31.5 pg (ref 25.1–34.0)
MCHC: 32.9 g/dL (ref 31.5–36.0)
MCV: 95.8 fL (ref 79.5–101.0)
MONO#: 1.1 10*3/uL — AB (ref 0.1–0.9)
MONO%: 11.1 % (ref 0.0–14.0)
NEUT#: 6 10*3/uL (ref 1.5–6.5)
NEUT%: 61.9 % (ref 38.4–76.8)
PLATELETS: 138 10*3/uL — AB (ref 145–400)
RBC: 3.93 10*6/uL (ref 3.70–5.45)
RDW: 15 % — ABNORMAL HIGH (ref 11.2–14.5)
Retic %: 2.39 % — ABNORMAL HIGH (ref 0.70–2.10)
Retic Ct Abs: 93.93 10*3/uL — ABNORMAL HIGH (ref 33.70–90.70)
WBC: 9.6 10*3/uL (ref 3.9–10.3)

## 2014-09-10 LAB — FERRITIN CHCC: Ferritin: 45 ng/ml (ref 9–269)

## 2014-09-10 NOTE — Telephone Encounter (Signed)
lvm for pt regarding to march 2017 appt.....mailed appt sched/avs adn letter

## 2014-09-11 ENCOUNTER — Ambulatory Visit: Payer: Medicare Other | Admitting: Hematology and Oncology

## 2014-09-11 ENCOUNTER — Other Ambulatory Visit: Payer: Medicare Other

## 2014-09-11 NOTE — Assessment & Plan Note (Signed)
The patient is not anemic and has not needed iron infusion since 2014. I will see her again in one year with repeat blood work.

## 2014-09-11 NOTE — Assessment & Plan Note (Signed)
Clinically, she has no signs of disease recurrence. I will continue to see her on a yearly basis with history, physical examination and recommend her to continue on mammogram.

## 2014-09-11 NOTE — Progress Notes (Signed)
[place] OFFICE PROGRESS NOTE  Patient Care Team: Prince Solian, MD as PCP - General Gatha Mayer, MD as Consulting Physician (Gastroenterology) Evans Lance, MD as Consulting Physician (Cardiology) Collene Gobble, MD as Consulting Physician (Pulmonary Disease) Jiles Prows, MD as Attending Physician (General Practice) Heath Lark, MD as Consulting Physician (Hematology and Oncology)  SUMMARY OF ONCOLOGIC HISTORY: Remote history of left breast cancer, history of B12 deficiency and iron deficiency  SUMMARY OF HEMATOLOGIC HISTORY: This patient have remote history of breast cancer diagnosed from screening mammogram. She had surgery, radiation therapy and adjuvant tamoxifen. 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 She had received B12 injection for B12 deficiency and intravenous iron for iron deficiency anemia. The patient had history of allergic reaction to intravenous iron dextran. She received intravenous iron feraheme in November 9030 without complications.  INTERVAL HISTORY: Please see below for problem oriented charting. She continues to have cough or shortness of breath from asthma/COPD. The patient denies any recent signs or symptoms of bleeding such as spontaneous epistaxis, hematuria or hematochezia.   REVIEW OF SYSTEMS:   Constitutional: Denies fevers, chills or abnormal weight loss Eyes: Denies blurriness of vision Ears, nose, mouth, throat, and face: Denies mucositis or sore throat Cardiovascular: Denies palpitation, chest discomfort or lower extremity swelling Gastrointestinal:  Denies nausea, heartburn or change in bowel habits Skin: Denies abnormal skin rashes Lymphatics: Denies new lymphadenopathy  Neurological:Denies numbness, tingling or new weaknesses Behavioral/Psych: Mood is stable, no new changes  All other systems were reviewed with the patient and are negative.  I have  reviewed the past medical history, past surgical history, social history and family history with the patient and they are unchanged from previous note.  ALLERGIES:  is allergic to aspirin; sulfonamide derivatives; ibuprofen; iron dextran; sulfa antibiotics; and metoclopramide hcl.  MEDICATIONS:  Current Outpatient Prescriptions  Medication Sig Dispense Refill  . acetaminophen (TYLENOL) 325 MG tablet Take 650 mg by mouth as needed.    [name] albuterol (PROVENTIL HFA;VENTOLIN HFA) 108 (90 BASE) MCG/ACT inhaler Inhale 2 puffs into the lungs 2 (two) times daily.    [name] albuterol (PROVENTIL) (2.5 MG/3ML) 0.083% nebulizer solution Take 2.5 mg by nebulization every 4 (four) hours as needed for wheezing or shortness of breath.    . ALPRAZolam (XANAX) 0.25 MG tablet Take 0.25 mg by mouth every 4 (four) hours as needed for anxiety.    . baclofen (LIORESAL) 10 MG tablet Take 10 mg by mouth every 8 (eight) hours as needed.    . beclomethasone (QVAR) 80 MCG/ACT inhaler Inhale 3 puffs into the lungs 2 (two) times daily.     . Budesonide (RHINOCORT AQUA NA) Place 1 spray into both nostrils daily.     . chlorpheniramine-HYDROcodone (TUSSIONEX) 10-8 MG/5ML LQCR Take 5 mLs by mouth at bedtime as needed (for cough).    . Dexlansoprazole (DEXILANT) 30 MG capsule Take 60 mg by mouth daily.    . diphenhydrAMINE (BENADRYL) 25 mg capsule Take 25 mg by mouth as needed.    . DULoxetine (CYMBALTA) 30 MG capsule Take 30 mg by mouth daily.    [name] EPINEPHrine (EPIPEN IJ) Inject as directed.    [name] ESZOPICLONE 3 MG tablet Take 3 mg by mouth as needed.     . furosemide (LASIX) 20 MG tablet Take 20 mg by mouth daily as needed. For fluid retention.    [name] losartan (COZAAR) 50 MG tablet Take  50 mg by mouth daily.    . metoprolol succinate (TOPROL-XL) 50 MG 24 hr tablet TAKE 1 TABLET BY MOUTH EVERY DAY 90 tablet 1  . mirtazapine (REMERON) 30 MG tablet Take 30 mg by mouth at bedtime.     . Multiple Vitamin (MULTIVITAMIN WITH MINERALS) TABS  Take 1 tablet by mouth daily.    [name] oxyCODONE (OXY IR/ROXICODONE) 5 MG immediate release tablet Take 5-10 mg by mouth every 4 (four) hours as needed (for cough / pain).     . polyethylene glycol (MIRALAX / GLYCOLAX) packet Take 17 g by mouth daily.    . Polyvinyl Alcohol (LIQUID TEARS OP) Apply 2 drops to eye 4 (four) times daily as needed.    . pravastatin (PRAVACHOL) 40 MG tablet Take 40 mg by mouth daily.    . predniSONE (DELTASONE) 5 MG tablet Take 5 mg by mouth daily with breakfast.     . ranitidine (ZANTAC) 300 MG tablet Take 300 mg by mouth daily.     [name] senna-docusate (SENOKOT-S) 8.6-50 MG per tablet Take 1 tablet by mouth daily.    . traZODone (DESYREL) 50 MG tablet Take 50 mg by mouth as needed for sleep.     . Vitamin D, Ergocalciferol, (DRISDOL) 50000 UNITS CAPS Take 50,000 Units by mouth every 7 (seven) days. On Fridays    . zafirlukast (ACCOLATE) 20 MG tablet Take 20 mg by mouth 2 (two) times daily.    [name] levothyroxine (SYNTHROID, LEVOTHROID) 50 MCG tablet Take 50 mcg by mouth daily before breakfast.      No current facility-administered medications for this visit.    PHYSICAL EXAMINATION: ECOG PERFORMANCE STATUS: 1 - Symptomatic but completely ambulatory  Filed Vitals:   09/10/14 0924  BP: 155/65  Pulse: 87  Temp: 97.9 F (36.6 C)  Resp: 18   Filed Weights   09/10/14 0924  Weight: 174 lb 4.8 oz (79.062 kg)    GENERAL:alert, no distress and comfortable SKIN: skin color, texture, turgor are normal, no rashes or significant lesions. Extensive bruises  noted EYES: normal, Conjunctiva are pink and non-injected, sclera clear OROPHARYNX:no exudate, no erythema and lips, buccal mucosa, and tongue normal  NECK: supple, thyroid normal size, non-tender, without nodularity LYMPH:  no palpable lymphadenopathy in the cervical, axillary or inguinal LUNGS: Diffuse expiratory wheezes. HEART: regular rate & rhythm and no murmurs and no lower extremity edema ABDOMEN:abdomen soft,  non-tender and normal bowel sounds Musculoskeletal:no cyanosis of digits and no clubbing  NEURO: alert & oriented x 3 with fluent speech, no focal motor/sensory deficits Bilateral breast examination were performed. Well-healed lumpectomy scar on the left. No other palpable abnormalities LABORATORY DATA:  I have reviewed the data as listed    Component Value Date/Time   NA 142 06/06/2013 1343   NA 141 11/16/2012 0645   K 4.9 06/06/2013 1343   K 3.1* 11/16/2012 0645   CL 96 11/16/2012 0645   CO2 29 06/06/2013 1343   CO2 37* 11/16/2012 0645   GLUCOSE 81 06/06/2013 1343   GLUCOSE 86 11/16/2012 0645   BUN 44.2* 06/06/2013 1343   BUN 37* 11/16/2012 0645   CREATININE 1.0 06/06/2013 1343   CREATININE 1.27* 11/16/2012 0645   CALCIUM 10.4 06/06/2013 1343   CALCIUM 9.6 11/16/2012 0645   PROT 6.4 06/06/2013 1343   PROT 6.0 04/19/2012 1551   ALBUMIN 3.8 06/06/2013 1343   ALBUMIN 3.3* 04/19/2012 1551   AST 30 06/06/2013 1343   AST 32 04/19/2012 1551   ALT 29  06/06/2013 1343   ALT 22 04/19/2012 1551   ALKPHOS 69 06/06/2013 1343   ALKPHOS 60 04/19/2012 1551   BILITOT 0.38 06/06/2013 1343   BILITOT 0.3 04/19/2012 1551   GFRNONAA 40* 11/16/2012 0645   GFRAA 46* 11/16/2012 0645    No results found for: SPEP, UPEP  Lab Results  Component Value Date   WBC 9.6 09/10/2014   NEUTROABS 6.0 09/10/2014   HGB 12.4 09/10/2014   HCT 37.6 09/10/2014   MCV 95.8 09/10/2014   PLT 138* 09/10/2014      Chemistry      Component Value Date/Time   NA 142 06/06/2013 1343   NA 141 11/16/2012 0645   K 4.9 06/06/2013 1343   K 3.1* 11/16/2012 0645   CL 96 11/16/2012 0645   CO2 29 06/06/2013 1343   CO2 37* 11/16/2012 0645   BUN 44.2* 06/06/2013 1343   BUN 37* 11/16/2012 0645   CREATININE 1.0 06/06/2013 1343   CREATININE 1.27* 11/16/2012 0645      Component Value Date/Time   CALCIUM 10.4 06/06/2013 1343   CALCIUM 9.6 11/16/2012 0645   ALKPHOS 69 06/06/2013 1343   ALKPHOS 60 04/19/2012 1551    AST 30 06/06/2013 1343   AST 32 04/19/2012 1551   ALT 29 06/06/2013 1343   ALT 22 04/19/2012 1551   BILITOT 0.38 06/06/2013 1343   BILITOT 0.3 04/19/2012 1551      ASSESSMENT & PLAN:  Malignant neoplasm of female breast Clinically, she has no signs of disease recurrence. I will continue to see her on a yearly basis with history, physical examination and recommend her to continue on mammogram.   Iron deficiency anemia The patient is not anemic and has not needed iron infusion since 2014. I will see her again in one year with repeat blood work.    Orders Placed This Encounter  Procedures  . CBC & Diff and Retic    Standing Status: Future     Number of Occurrences:      Standing Expiration Date: 10/15/2015  . Ferritin    Standing Status: Future     Number of Occurrences:      Standing Expiration Date: 10/15/2015   All questions were answered. The patient knows to call the clinic with any problems, questions or concerns. No barriers to learning was detected. I spent 15 minutes counseling the patient face to face. The total time spent in the appointment was 20 minutes and more than 50% was on counseling and review of test results     [facility], [place], MD 09/11/2014 8:39 AM

## 2014-09-18 ENCOUNTER — Encounter: Payer: Self-pay | Admitting: Cardiology

## 2014-09-29 ENCOUNTER — Ambulatory Visit (INDEPENDENT_AMBULATORY_CARE_PROVIDER_SITE_OTHER): Payer: Medicare Other | Admitting: *Deleted

## 2014-09-29 ENCOUNTER — Encounter: Payer: Self-pay | Admitting: Internal Medicine

## 2014-09-29 DIAGNOSIS — I480 Paroxysmal atrial fibrillation: Secondary | ICD-10-CM

## 2014-09-29 LAB — MDC_IDC_ENUM_SESS_TYPE_REMOTE
Battery Impedance: 4729 Ohm
Battery Voltage: 2.64 V
Brady Statistic AP VS Percent: 0 %
Brady Statistic AS VP Percent: 89 %
Lead Channel Impedance Value: 653 Ohm
Lead Channel Pacing Threshold Amplitude: 0.625 V
Lead Channel Pacing Threshold Amplitude: 1 V
Lead Channel Sensing Intrinsic Amplitude: 2.8 mV
Lead Channel Setting Pacing Amplitude: 2 V
Lead Channel Setting Pacing Pulse Width: 0.4 ms
Lead Channel Setting Sensing Sensitivity: 2.8 mV
MDC IDC MSMT BATTERY REMAINING LONGEVITY: 5 mo
MDC IDC MSMT LEADCHNL RA IMPEDANCE VALUE: 429 Ohm
MDC IDC MSMT LEADCHNL RA PACING THRESHOLD PULSEWIDTH: 0.4 ms
MDC IDC MSMT LEADCHNL RV PACING THRESHOLD PULSEWIDTH: 0.4 ms
MDC IDC SESS DTM: 20160411145613
MDC IDC SET LEADCHNL RV PACING AMPLITUDE: 2.5 V
MDC IDC STAT BRADY AP VP PERCENT: 11 %
MDC IDC STAT BRADY AS VS PERCENT: 0 %

## 2014-09-29 NOTE — Progress Notes (Signed)
Remote pacemaker transmission.   

## 2014-10-10 ENCOUNTER — Other Ambulatory Visit: Payer: Self-pay

## 2014-10-10 DIAGNOSIS — Z1231 Encounter for screening mammogram for malignant neoplasm of breast: Secondary | ICD-10-CM

## 2014-10-13 ENCOUNTER — Encounter: Payer: Self-pay | Admitting: Cardiology

## 2014-10-17 ENCOUNTER — Ambulatory Visit
Admission: RE | Admit: 2014-10-17 | Discharge: 2014-10-17 | Disposition: A | Discharge status: Home / Self Care | Payer: Medicare Other | Source: Ambulatory Visit

## 2014-10-17 DIAGNOSIS — Z1231 Encounter for screening mammogram for malignant neoplasm of breast: Secondary | ICD-10-CM

## 2014-10-20 ENCOUNTER — Other Ambulatory Visit: Payer: Self-pay | Admitting: Obstetrics & Gynecology

## 2014-10-20 DIAGNOSIS — R928 Other abnormal and inconclusive findings on diagnostic imaging of breast: Secondary | ICD-10-CM

## 2014-10-21 ENCOUNTER — Other Ambulatory Visit: Payer: Medicare Other

## 2014-10-22 ENCOUNTER — Ambulatory Visit
Admission: RE | Admit: 2014-10-22 | Discharge: 2014-10-22 | Disposition: A | Discharge status: Home / Self Care | Payer: Medicare Other | Source: Ambulatory Visit | Attending: Obstetrics & Gynecology | Admitting: Obstetrics & Gynecology

## 2014-10-22 ENCOUNTER — Other Ambulatory Visit: Payer: Self-pay | Admitting: Obstetrics & Gynecology

## 2014-10-22 DIAGNOSIS — R928 Other abnormal and inconclusive findings on diagnostic imaging of breast: Secondary | ICD-10-CM

## 2014-10-22 DIAGNOSIS — M79622 Pain in left upper arm: Secondary | ICD-10-CM

## 2014-10-30 ENCOUNTER — Telehealth: Payer: Self-pay | Admitting: Cardiology

## 2014-10-30 ENCOUNTER — Ambulatory Visit (INDEPENDENT_AMBULATORY_CARE_PROVIDER_SITE_OTHER): Payer: Medicare Other | Admitting: *Deleted

## 2014-10-30 DIAGNOSIS — Z950 Presence of cardiac pacemaker: Secondary | ICD-10-CM

## 2014-10-30 NOTE — Telephone Encounter (Signed)
LMOVM reminding pt to send remote transmission.   

## 2014-10-30 NOTE — Progress Notes (Signed)
Remote pacemaker transmission.   

## 2014-10-31 ENCOUNTER — Telehealth: Payer: Self-pay | Admitting: *Deleted

## 2014-10-31 NOTE — Telephone Encounter (Signed)
Called pt to update her about recent battery check on remote (no ERI yet). Also called pt to schedule appt to see Dr. Lovena Le. Pt was busy when I called, stated she would call back to schedule OV.

## 2014-11-06 NOTE — Telephone Encounter (Signed)
Follow Up  Pt called back

## 2014-11-06 NOTE — Telephone Encounter (Signed)
LMOVM w/ my direct #. 

## 2014-11-11 ENCOUNTER — Inpatient Hospital Stay (HOSPITAL_BASED_OUTPATIENT_CLINIC_OR_DEPARTMENT_OTHER)
Admission: EM | Admit: 2014-11-11 | Discharge: 2014-11-16 | DRG: 812 | Disposition: A | Discharge status: Skilled Nursing Facility | Payer: Medicare Other | Attending: Family Medicine | Admitting: Family Medicine

## 2014-11-11 ENCOUNTER — Emergency Department (HOSPITAL_BASED_OUTPATIENT_CLINIC_OR_DEPARTMENT_OTHER): Payer: Medicare Other

## 2014-11-11 ENCOUNTER — Encounter (HOSPITAL_BASED_OUTPATIENT_CLINIC_OR_DEPARTMENT_OTHER): Payer: Self-pay | Admitting: Emergency Medicine

## 2014-11-11 DIAGNOSIS — Z89021 Acquired absence of right finger(s): Secondary | ICD-10-CM

## 2014-11-11 DIAGNOSIS — E538 Deficiency of other specified B group vitamins: Secondary | ICD-10-CM | POA: Diagnosis present

## 2014-11-11 DIAGNOSIS — D649 Anemia, unspecified: Secondary | ICD-10-CM | POA: Diagnosis present

## 2014-11-11 DIAGNOSIS — Z22322 Carrier or suspected carrier of Methicillin resistant Staphylococcus aureus: Secondary | ICD-10-CM

## 2014-11-11 DIAGNOSIS — Z7952 Long term (current) use of systemic steroids: Secondary | ICD-10-CM

## 2014-11-11 DIAGNOSIS — I4891 Unspecified atrial fibrillation: Secondary | ICD-10-CM | POA: Diagnosis present

## 2014-11-11 DIAGNOSIS — Z853 Personal history of malignant neoplasm of breast: Secondary | ICD-10-CM

## 2014-11-11 DIAGNOSIS — N183 Chronic kidney disease, stage 3 (moderate): Secondary | ICD-10-CM | POA: Diagnosis present

## 2014-11-11 DIAGNOSIS — R233 Spontaneous ecchymoses: Secondary | ICD-10-CM

## 2014-11-11 DIAGNOSIS — F0390 Unspecified dementia without behavioral disturbance: Secondary | ICD-10-CM | POA: Diagnosis present

## 2014-11-11 DIAGNOSIS — R040 Epistaxis: Secondary | ICD-10-CM

## 2014-11-11 DIAGNOSIS — J441 Chronic obstructive pulmonary disease with (acute) exacerbation: Secondary | ICD-10-CM | POA: Diagnosis not present

## 2014-11-11 DIAGNOSIS — Z886 Allergy status to analgesic agent status: Secondary | ICD-10-CM

## 2014-11-11 DIAGNOSIS — Z888 Allergy status to other drugs, medicaments and biological substances status: Secondary | ICD-10-CM | POA: Diagnosis not present

## 2014-11-11 DIAGNOSIS — K435 Parastomal hernia without obstruction or  gangrene: Secondary | ICD-10-CM | POA: Diagnosis present

## 2014-11-11 DIAGNOSIS — Z950 Presence of cardiac pacemaker: Secondary | ICD-10-CM | POA: Diagnosis not present

## 2014-11-11 DIAGNOSIS — K219 Gastro-esophageal reflux disease without esophagitis: Secondary | ICD-10-CM | POA: Diagnosis present

## 2014-11-11 DIAGNOSIS — R718 Other abnormality of red blood cells: Secondary | ICD-10-CM | POA: Diagnosis not present

## 2014-11-11 DIAGNOSIS — D539 Nutritional anemia, unspecified: Principal | ICD-10-CM | POA: Diagnosis present

## 2014-11-11 DIAGNOSIS — Z881 Allergy status to other antibiotic agents status: Secondary | ICD-10-CM

## 2014-11-11 DIAGNOSIS — Z79891 Long term (current) use of opiate analgesic: Secondary | ICD-10-CM

## 2014-11-11 DIAGNOSIS — Z9981 Dependence on supplemental oxygen: Secondary | ICD-10-CM

## 2014-11-11 DIAGNOSIS — J45909 Unspecified asthma, uncomplicated: Secondary | ICD-10-CM | POA: Diagnosis present

## 2014-11-11 DIAGNOSIS — I129 Hypertensive chronic kidney disease with stage 1 through stage 4 chronic kidney disease, or unspecified chronic kidney disease: Secondary | ICD-10-CM | POA: Diagnosis present

## 2014-11-11 DIAGNOSIS — Z8719 Personal history of other diseases of the digestive system: Secondary | ICD-10-CM | POA: Diagnosis not present

## 2014-11-11 DIAGNOSIS — E039 Hypothyroidism, unspecified: Secondary | ICD-10-CM | POA: Diagnosis present

## 2014-11-11 DIAGNOSIS — K7460 Unspecified cirrhosis of liver: Secondary | ICD-10-CM | POA: Diagnosis present

## 2014-11-11 DIAGNOSIS — Z87891 Personal history of nicotine dependence: Secondary | ICD-10-CM | POA: Diagnosis not present

## 2014-11-11 DIAGNOSIS — M720 Palmar fascial fibromatosis [Dupuytren]: Secondary | ICD-10-CM | POA: Diagnosis present

## 2014-11-11 DIAGNOSIS — Z882 Allergy status to sulfonamides status: Secondary | ICD-10-CM | POA: Diagnosis not present

## 2014-11-11 DIAGNOSIS — D696 Thrombocytopenia, unspecified: Secondary | ICD-10-CM | POA: Diagnosis present

## 2014-11-11 DIAGNOSIS — Q2733 Arteriovenous malformation of digestive system vessel: Secondary | ICD-10-CM | POA: Diagnosis not present

## 2014-11-11 DIAGNOSIS — R238 Other skin changes: Secondary | ICD-10-CM

## 2014-11-11 DIAGNOSIS — R059 Cough, unspecified: Secondary | ICD-10-CM

## 2014-11-11 DIAGNOSIS — Z79899 Other long term (current) drug therapy: Secondary | ICD-10-CM

## 2014-11-11 DIAGNOSIS — R935 Abnormal findings on diagnostic imaging of other abdominal regions, including retroperitoneum: Secondary | ICD-10-CM | POA: Diagnosis not present

## 2014-11-11 DIAGNOSIS — R05 Cough: Secondary | ICD-10-CM

## 2014-11-11 DIAGNOSIS — Z933 Colostomy status: Secondary | ICD-10-CM | POA: Diagnosis not present

## 2014-11-11 LAB — BASIC METABOLIC PANEL
Anion gap: 9 (ref 5–15)
BUN: 30 mg/dL — AB (ref 6–20)
CHLORIDE: 105 mmol/L (ref 101–111)
CO2: 25 mmol/L (ref 22–32)
Calcium: 9.7 mg/dL (ref 8.9–10.3)
Creatinine, Ser: 1.15 mg/dL — ABNORMAL HIGH (ref 0.44–1.00)
GFR calc Af Amer: 51 mL/min — ABNORMAL LOW (ref >60)
GFR, EST NON AFRICAN AMERICAN: 44 mL/min — AB (ref >60)
GLUCOSE: 119 mg/dL — AB (ref 65–99)
Potassium: 4.4 mmol/L (ref 3.5–5.1)
SODIUM: 139 mmol/L (ref 135–145)

## 2014-11-11 LAB — CBC WITH DIFFERENTIAL/PLATELET
BASOS ABS: 0.1 10*3/uL (ref 0.0–0.1)
Basophils Relative: 1 % (ref 0–1)
Eosinophils Absolute: 0.1 10*3/uL (ref 0.0–0.7)
Eosinophils Relative: 2 % (ref 0–5)
HEMATOCRIT: 24.2 % — AB (ref 36.0–46.0)
Hemoglobin: 7.8 g/dL — ABNORMAL LOW (ref 12.0–15.0)
Lymphocytes Relative: 21 % (ref 12–46)
Lymphs Abs: 1.3 10*3/uL (ref 0.7–4.0)
MCH: 33.3 pg (ref 26.0–34.0)
MCHC: 32.2 g/dL (ref 30.0–36.0)
MCV: 103.4 fL — ABNORMAL HIGH (ref 78.0–100.0)
MONO ABS: 0.5 10*3/uL (ref 0.1–1.0)
MONOS PCT: 8 % (ref 3–12)
Neutro Abs: 4.3 10*3/uL (ref 1.7–7.7)
Neutrophils Relative %: 68 % (ref 43–77)
Platelets: 149 10*3/uL — ABNORMAL LOW (ref 150–400)
RBC: 2.34 MIL/uL — ABNORMAL LOW (ref 3.87–5.11)
RDW: 15.4 % (ref 11.5–15.5)
WBC: 6.3 10*3/uL (ref 4.0–10.5)

## 2014-11-11 MED ORDER — IPRATROPIUM-ALBUTEROL 0.5-2.5 (3) MG/3ML IN SOLN
3.0000 mL | Freq: Once | RESPIRATORY_TRACT | Status: AC
  Administered 2014-11-11: 3 mL via RESPIRATORY_TRACT
  Filled 2014-11-11: qty 3

## 2014-11-11 MED ORDER — IPRATROPIUM-ALBUTEROL 0.5-2.5 (3) MG/3ML IN SOLN: 3.0000 mL | RESPIRATORY_TRACT | Status: DC

## 2014-11-11 MED ORDER — PREDNISONE 50 MG PO TABS
60.0000 mg | ORAL_TABLET | Freq: Once | ORAL | Status: AC
  Administered 2014-11-11: 60 mg via ORAL
  Filled 2014-11-11 (×2): qty 1

## 2014-11-11 NOTE — ED Notes (Signed)
81 yo with epistaxis with clots at 12 noon when she blew her nose. Pt has an increased amount of post nasal drip. Pt uses humidified oxygen at night for COPD. Hx of PPM, Anemia and has had bruising on the extremities.

## 2014-11-11 NOTE — ED Notes (Signed)
MD at bedside. 

## 2014-11-11 NOTE — H&P (Addendum)
[name] is an 81 y.o. female.    [name] (pcp)  Chief Complaint: anemia  HPI: 81 yo female with hx of anemia, diverticulosis, angiodysplasia of stomach, colon, vitamin b12 def apparently c/o feeling weak,  Recently asked her pcp to draw her blood and hgb was 8.8.  Today pt had epistaxis and was found to have hgb of 7.8  Pt was found in ED to have epistaxis with cough, and therefore treated for copd by ED.  Pt will be admitted for epistaxis.   Past Medical History  Diagnosis Date  . Insomnia, unspecified   . Osteoarthrosis, unspecified whether generalized or localized, unspecified site   . Unspecified hypothyroidism   . Unspecified essential hypertension   . Atrial fibrillation   . Sciatica   . Anemia   . COPD (chronic obstructive pulmonary disease)   . UTI (urinary tract infection)   . GERD (gastroesophageal reflux disease)   . Pneumonia   . Asthma   . Clostridium difficile colitis   . Diverticulitis of sigmoid colon 11/2010    with abscess/contained perforation  . Dementia     mild  . Dehydration with hyponatremia 12/2010    hospitalized  . Angiodysplasia of stomach     ablated 2010  . Angiodysplasia of colon     ablated 2010  . Vitamin B12 deficiency (dietary) anemia 04/12/2011  . Shingles   . Sarcoma     right hand  . Malignant neoplasm of breast (female), unspecified site     left  . Dupuytren's disease     left hand  . Vitamin B12 deficiency 04/02/2012  . Complication of anesthesia     " very cautious because of my COPD"  . Pacemaker     Past Surgical History  Procedure Laterality Date  . Nasal sinus surgery      x 3  . Appendectomy    . Hernia repair    . Abdominal hysterectomy    . Pacemaker insertion    . Eye surgery      x6-bilateral  . Finger amputation      right  . Foot surgery      right toes x 3  . Tonsillectomy    . Colonoscopy  01/2009    w/ablation of 3 mm AVM, diverticulosis, external hemorrhoids  . Upper gastrointestinal endoscopy   01/2009    w/polypectomy, AVM, hyperplastic polyp  . Abdominal hysterectomy    . Laparotomy  11/12/2011    Procedure: EXPLORATORY LAPAROTOMY;  Surgeon: Madilyn Hook, DO;  Location: [facility];  Service: General;  Laterality: N/A;  . Colostomy revision  11/12/2011    Procedure: COLON RESECTION SIGMOID;  Surgeon: Madilyn Hook, DO;  Location: [facility];  Service: General;  Laterality: N/A;  . Colostomy  11/12/2011    Procedure: COLOSTOMY;  Surgeon: Madilyn Hook, DO;  Location: [facility];  Service: General;  Laterality: Left;  End colostomy  . Laparotomy  11/19/2011    Procedure: EXPLORATORY LAPAROTOMY;  Surgeon: Shann Medal, MD;  Location: [facility];  Service: General;  Laterality: N/A;  . Gastrostomy  11/19/2011    Procedure: GASTROSTOMY;  Surgeon: Shann Medal, MD;  Location: [facility];  Service: General;;  INSERTION GASTROSTOMY TUBE  . Application of wound vac  11/19/2011    Procedure: APPLICATION OF WOUND VAC;  Surgeon: Shann Medal, MD;  Location: [facility];  Service: General;  Laterality: N/A;    Family History  Problem Relation Age of Onset  . Emphysema  Paternal Uncle   . Heart disease Mother   . Colon cancer Mother   . Pancreatic cancer Father   . Cancer Father 10    pancreatic cancer  . Tuberculosis Paternal Uncle   . Breast cancer Maternal Aunt   . Breast cancer Cousin   . Prostate cancer Cousin   . Colon cancer Cousin   . Uterine cancer Cousin    Social History:  reports that she quit smoking about 37 years ago. Her smoking use included Cigarettes. She has a 10.5 pack-year smoking history. She has never used smokeless tobacco. She reports that she drinks alcohol. She reports that she does not use illicit drugs.  Allergies:  Allergies  Allergen Reactions  . Aspirin Shortness Of Breath    Kicks up your asthma  . Sulfonamide Derivatives Hives  . Ibuprofen Other (See Comments)    Doesn't take because of her aspirin allergy  . Iron Dextran Itching     itching after test dose  . Sulfa  Antibiotics Hives  . Metoclopramide Hcl Other (See Comments)    REACTION: chomping mouth    Medications Prior to Admission  Medication Sig Dispense Refill  . hydrocortisone cream 1 % Apply 1 application topically once as needed for itching.    . triamcinolone cream (KENALOG) 0.1 % Apply 1 application topically as needed.    [name] acetaminophen (TYLENOL) 325 MG tablet Take 650 mg by mouth as needed.    [name] albuterol (PROVENTIL HFA;VENTOLIN HFA) 108 (90 BASE) MCG/ACT inhaler Inhale 2 puffs into the lungs 2 (two) times daily.    [name] albuterol (PROVENTIL) (2.5 MG/3ML) 0.083% nebulizer solution Take 2.5 mg by nebulization every 4 (four) hours as needed for wheezing or shortness of breath.    . ALPRAZolam (XANAX) 0.25 MG tablet Take 0.25 mg by mouth every 4 (four) hours as needed for anxiety.    . baclofen (LIORESAL) 10 MG tablet Take 10 mg by mouth every 8 (eight) hours as needed.    . beclomethasone (QVAR) 80 MCG/ACT inhaler Inhale 3 puffs into the lungs 2 (two) times daily.     . Budesonide (RHINOCORT AQUA NA) Place 1 spray into both nostrils daily.     . chlorpheniramine-HYDROcodone (TUSSIONEX) 10-8 MG/5ML LQCR Take 5 mLs by mouth at bedtime as needed (for cough).    . Dexlansoprazole (DEXILANT) 30 MG capsule Take 60 mg by mouth daily.    . diphenhydrAMINE (BENADRYL) 25 mg capsule Take 25 mg by mouth as needed.    . DULoxetine (CYMBALTA) 30 MG capsule Take 30 mg by mouth daily.    [name] EPINEPHrine (EPIPEN IJ) Inject as directed.    [name] ESZOPICLONE 3 MG tablet Take 3 mg by mouth as needed.     . furosemide (LASIX) 20 MG tablet Take 20 mg by mouth daily as needed. For fluid retention.    [name] levothyroxine (SYNTHROID, LEVOTHROID) 50 MCG tablet Take 50 mcg by mouth daily before breakfast.     . losartan (COZAAR) 50 MG tablet Take 50 mg by mouth daily.    . metoprolol succinate (TOPROL-XL) 50 MG 24 hr tablet TAKE 1 TABLET BY MOUTH EVERY DAY 90 tablet 1  . mirtazapine (REMERON) 30 MG tablet Take 30 mg by mouth at  bedtime.     . Multiple Vitamin (MULTIVITAMIN WITH MINERALS) TABS Take 1 tablet by mouth daily.    [name] oxyCODONE (OXY IR/ROXICODONE) 5 MG immediate release tablet Take 5-10 mg by mouth every 4 (four) hours as needed (for cough /  pain).     . polyethylene glycol (MIRALAX / GLYCOLAX) packet Take 17 g by mouth daily.    . Polyvinyl Alcohol (LIQUID TEARS OP) Apply 2 drops to eye 4 (four) times daily as needed.    . pravastatin (PRAVACHOL) 40 MG tablet Take 40 mg by mouth daily.    . predniSONE (DELTASONE) 5 MG tablet Take 5 mg by mouth daily with breakfast.     . ranitidine (ZANTAC) 300 MG tablet Take 300 mg by mouth daily.     [name] senna-docusate (SENOKOT-S) 8.6-50 MG per tablet Take 1 tablet by mouth daily.    . traZODone (DESYREL) 50 MG tablet Take 50 mg by mouth as needed for sleep.     . Vitamin D, Ergocalciferol, (DRISDOL) 50000 UNITS CAPS Take 50,000 Units by mouth every 7 (seven) days. On Fridays    . zafirlukast (ACCOLATE) 20 MG tablet Take 20 mg by mouth 2 (two) times daily.      Results for orders placed or performed during the hospital encounter of 11/11/14 (from the past 48 hour(s))  CBC with Differential/Platelet     Status: Abnormal   Collection Time: 11/11/14  7:44 PM  Result Value Ref Range   WBC 6.3 4.0 - 10.5 K/uL   RBC 2.34 (L) 3.87 - 5.11 MIL/uL   Hemoglobin 7.8 (L) 12.0 - 15.0 g/dL   HCT 24.2 (L) 36.0 - 46.0 %   MCV 103.4 (H) 78.0 - 100.0 fL   MCH 33.3 26.0 - 34.0 pg   MCHC 32.2 30.0 - 36.0 g/dL   RDW 15.4 11.5 - 15.5 %   Platelets 149 (L) 150 - 400 K/uL   Neutrophils Relative % 68 43 - 77 %   Neutro Abs 4.3 1.7 - 7.7 K/uL   Lymphocytes Relative 21 12 - 46 %   Lymphs Abs 1.3 0.7 - 4.0 K/uL   Monocytes Relative 8 3 - 12 %   Monocytes Absolute 0.5 0.1 - 1.0 K/uL   Eosinophils Relative 2 0 - 5 %   Eosinophils Absolute 0.1 0.0 - 0.7 K/uL   Basophils Relative 1 0 - 1 %   Basophils Absolute 0.1 0.0 - 0.1 K/uL  Basic metabolic panel     Status: Abnormal   Collection Time:  11/11/14  7:44 PM  Result Value Ref Range   Sodium 139 135 - 145 mmol/L   Potassium 4.4 3.5 - 5.1 mmol/L   Chloride 105 101 - 111 mmol/L   CO2 25 22 - 32 mmol/L   Glucose, Bld 119 (H) 65 - 99 mg/dL   BUN 30 (H) 6 - 20 mg/dL   Creatinine, Ser 1.15 (H) 0.44 - 1.00 mg/dL   Calcium 9.7 8.9 - 10.3 mg/dL   GFR calc non Af Amer 44 (L) >60 mL/min   GFR calc Af Amer 51 (L) >60 mL/min    Comment: (NOTE) The eGFR has been calculated using the CKD EPI equation. This calculation has not been validated in all clinical situations. eGFR's persistently <60 mL/min signify possible Chronic Kidney Disease.    Anion gap 9 5 - 15   Dg Chest 2 View  11/11/2014   CLINICAL DATA:  Nose please 6 hours. Decreased red blood cells and hemoglobin.  EXAM: CHEST  2 VIEW  COMPARISON:  11/13/2012 and 11/08/2012  FINDINGS: Left-sided pacemaker is intact and unchanged. Lungs are adequately inflated as patient is slightly rotated to the right. There is no focal consolidation or effusion. Cardiomediastinal silhouette is within normal. There  is a stable calcified granuloma over the left upper lobe. Old right mid lateral rib fractures. Degenerative changes of the spine are present.  IMPRESSION: No active cardiopulmonary disease.   Electronically Signed   By: Marin Olp M.D.   On: 11/11/2014 20:54    Review of Systems  Constitutional: Negative.   HENT: Negative.   Eyes: Negative.   Respiratory: Negative.   Cardiovascular: Negative.   Gastrointestinal: Negative.   Genitourinary: Negative.   Musculoskeletal: Negative.   Skin: Negative.   Neurological: Negative.   Endo/Heme/Allergies: Negative.   Psychiatric/Behavioral: Negative.     Blood pressure 98/67, pulse 88, temperature 98.7 F (37.1 C), temperature source Oral, resp. rate 18, height 5' 4.5" (1.638 m), weight 79.8 kg (175 lb 14.8 oz), SpO2 97 %. Physical Exam  Constitutional: She is oriented to person, place, and time. She appears well-developed and  well-nourished.  HENT:  Head: Normocephalic and atraumatic.  Eyes: Conjunctivae and EOM are normal. Pupils are equal, round, and reactive to light. No scleral icterus.  Neck: Normal range of motion. Neck supple. No JVD present. No tracheal deviation present. No thyromegaly present.  Cardiovascular: Normal rate and regular rhythm.  Exam reveals no gallop and no friction rub.   No murmur heard. Respiratory: Effort normal and breath sounds normal. No respiratory distress. She has no wheezes. She has no rales.  GI: Soft. Bowel sounds are normal. She exhibits no distension. There is no tenderness. There is no rebound and no guarding.  Musculoskeletal: Normal range of motion. She exhibits no edema or tenderness.  Neurological: She is alert and oriented to person, place, and time. She has normal reflexes. She displays normal reflexes. No cranial nerve deficit. She exhibits normal muscle tone. Coordination normal.  Skin: Skin is warm and dry. No rash noted. No erythema. No pallor.  Psychiatric: She has a normal mood and affect. Her behavior is normal. Judgment and thought content normal.     Assessment/Plan Anemia Check spep, upep, tsh Check stool for hemeoccult  Check cbc in am, if hgb <7.5 please transfuse with 2 units prbc, type and screen ? AVM as cause of blood loss, please involve GI in am protonix 55m iv bid  Copd exacerbation Prednisone 532mpo qday x1,  Can reevaluate in am Cont current breathing treatments  Hypothyroidism Cont levothyroxine  DVT prophylaxis: scd   KIJani Gravel/24/2016, 11:54 PM

## 2014-11-11 NOTE — ED Provider Notes (Addendum)
CSN: [id number]     Arrival date & time 11/11/14  1755 History  This chart was scribed for Miranda Sorrow, MD by Rayfield Citizen, ED Scribe. This patient was seen in room MH04/MH04 and the patient's care was started at 6:32 PM.    Chief Complaint  Patient presents with  . Epistaxis   Patient is a 81 y.o. female presenting with nosebleeds. The history is provided by the patient. No language interpreter was used.  Epistaxis Location:  R nare Severity:  Moderate Duration:  7 hours Timing:  Constant Progression:  Unchanged Chronicity:  New Context: home oxygen   Context: not anticoagulants   Relieved by: Mild improvement with applied pressure and inserted tissues. Worsened by:  Nothing tried Associated symptoms: blood in oropharynx, cough and headaches   Associated symptoms: no fever and no sore throat   Risk factors: no frequent nosebleeds and no recent nasal surgery    HPI Comments: [name] is a 81 y.o. female with past medical history of asthma, COPD who presents to the Emergency Department complaining of constant epistaxis beginning today around 12:00 just after blowing her nose. Patient explains that she has blood, bloody mucous (reports clear mucous at baseline) and clots from her nose and mouth with an increased amount of postnasal drip. She applied pressure, as well as ice, and inserted nasal tissues without significant improvement. She denies prior experience with nosebleeds. Her symptoms continue at present.   Patient also reports increased cough over the past 4 days which she relates to her chronic bronchitis. She notes associated SOB with ambulation and and generalized weakness; she has been using her home albuterol inhaler every 4 hours for the past three days. Patient complains of recent occasional nausea, resolved with eating, as well as recent "picking" frontal headaches which she states is unusual for her. She reports chronic back pain, chronic urinary frequency, and chronic  bruising.   She uses humidified oxygen at night for COPD. She has been on prednisone "daily for the past 36 years;" currently on 5mg .   Past Medical History  Diagnosis Date  . Insomnia, unspecified   . Osteoarthrosis, unspecified whether generalized or localized, unspecified site   . Unspecified hypothyroidism   . Unspecified essential hypertension   . Atrial fibrillation   . Sciatica   . Anemia   . COPD (chronic obstructive pulmonary disease)   . UTI (urinary tract infection)   . GERD (gastroesophageal reflux disease)   . Pneumonia   . Asthma   . Clostridium difficile colitis   . Diverticulitis of sigmoid colon 11/2010    with abscess/contained perforation  . Dementia     mild  . Dehydration with hyponatremia 12/2010    hospitalized  . Angiodysplasia of stomach     ablated 2010  . Angiodysplasia of colon     ablated 2010  . Vitamin B12 deficiency (dietary) anemia 04/12/2011  . Shingles   . Sarcoma     right hand  . Malignant neoplasm of breast (female), unspecified site     left  . Dupuytren's disease     left hand  . Vitamin B12 deficiency 04/02/2012  . Complication of anesthesia     " very cautious because of my COPD"  . Pacemaker    Past Surgical History  Procedure Laterality Date  . Nasal sinus surgery      x 3  . Appendectomy    . Hernia repair    . Abdominal hysterectomy    . Pacemaker  insertion    . Eye surgery      x6-bilateral  . Finger amputation      right  . Foot surgery      right toes x 3  . Tonsillectomy    . Colonoscopy  01/2009    w/ablation of 3 mm AVM, diverticulosis, external hemorrhoids  . Upper gastrointestinal endoscopy  01/2009    w/polypectomy, AVM, hyperplastic polyp  . Abdominal hysterectomy    . Laparotomy  11/12/2011    Procedure: EXPLORATORY LAPAROTOMY;  Surgeon: Madilyn Hook, DO;  Location: [facility];  Service: General;  Laterality: N/A;  . Colostomy revision  11/12/2011    Procedure: COLON RESECTION SIGMOID;  Surgeon: Madilyn Hook, DO;  Location: [facility];  Service: General;  Laterality: N/A;  . Colostomy  11/12/2011    Procedure: COLOSTOMY;  Surgeon: Madilyn Hook, DO;  Location: [facility];  Service: General;  Laterality: Left;  End colostomy  . Laparotomy  11/19/2011    Procedure: EXPLORATORY LAPAROTOMY;  Surgeon: Shann Medal, MD;  Location: [facility];  Service: General;  Laterality: N/A;  . Gastrostomy  11/19/2011    Procedure: GASTROSTOMY;  Surgeon: Shann Medal, MD;  Location: [facility];  Service: General;;  INSERTION GASTROSTOMY TUBE  . Application of wound vac  11/19/2011    Procedure: APPLICATION OF WOUND VAC;  Surgeon: Shann Medal, MD;  Location: [facility];  Service: General;  Laterality: N/A;   Family History  Problem Relation Age of Onset  . Emphysema Paternal Uncle   . Heart disease Mother   . Colon cancer Mother   . Pancreatic cancer Father   . Cancer Father 55    pancreatic cancer  . Tuberculosis Paternal Uncle   . Breast cancer Maternal Aunt   . Breast cancer Cousin   . Prostate cancer Cousin   . Colon cancer Cousin   . Uterine cancer Cousin    History  Substance Use Topics  . Smoking status: Former Smoker -- 0.50 packs/day for 21 years    Types: Cigarettes    Quit date: 06/20/1977  . Smokeless tobacco: Never Used  . Alcohol Use: Yes     Comment: 1 glass wine/night   OB History    No data available     Review of Systems  Constitutional: Negative for fever and chills.  HENT: Positive for nosebleeds and postnasal drip. Negative for rhinorrhea and sore throat.   Eyes: Negative for visual disturbance.  Respiratory: Positive for cough and shortness of breath.   Cardiovascular: Positive for leg swelling. Negative for chest pain.  Gastrointestinal: Positive for nausea. Negative for vomiting, abdominal pain and diarrhea.  Genitourinary: Positive for frequency. Negative for dysuria and hematuria.  Musculoskeletal: Positive for back pain.  Skin: Negative for rash.  Neurological: Positive for  headaches.  Hematological: Bruises/bleeds easily.  Psychiatric/Behavioral: Negative for confusion.  All other systems reviewed and are negative.  Allergies  Aspirin; Sulfonamide derivatives; Ibuprofen; Iron dextran; Sulfa antibiotics; and Metoclopramide hcl  Home Medications   Prior to Admission medications   Medication Sig Start Date End Date Taking? Authorizing Provider  hydrocortisone cream 1 % Apply 1 application topically once as needed for itching.   Yes Historical Provider, MD  triamcinolone cream (KENALOG) 0.1 % Apply 1 application topically as needed.   Yes Historical Provider, MD  acetaminophen (TYLENOL) 325 MG tablet Take 650 mg by mouth as needed.    Historical Provider, MD  albuterol (PROVENTIL HFA;VENTOLIN HFA) 108 (90 BASE) MCG/ACT inhaler Inhale 2  puffs into the lungs 2 (two) times daily.    Historical Provider, MD  albuterol (PROVENTIL) (2.5 MG/3ML) 0.083% nebulizer solution Take 2.5 mg by nebulization every 4 (four) hours as needed for wheezing or shortness of breath.    Historical Provider, MD  ALPRAZolam Duanne Moron) 0.25 MG tablet Take 0.25 mg by mouth every 4 (four) hours as needed for anxiety.    Historical Provider, MD  baclofen (LIORESAL) 10 MG tablet Take 10 mg by mouth every 8 (eight) hours as needed.    Historical Provider, MD  beclomethasone (QVAR) 80 MCG/ACT inhaler Inhale 3 puffs into the lungs 2 (two) times daily.     Historical Provider, MD  Budesonide (RHINOCORT AQUA NA) Place 1 spray into both nostrils daily.     Historical Provider, MD  chlorpheniramine-HYDROcodone (TUSSIONEX) 10-8 MG/5ML LQCR Take 5 mLs by mouth at bedtime as needed (for cough).    Historical Provider, MD  Dexlansoprazole (DEXILANT) 30 MG capsule Take 60 mg by mouth daily.    Historical Provider, MD  diphenhydrAMINE (BENADRYL) 25 mg capsule Take 25 mg by mouth as needed.    Historical Provider, MD  DULoxetine (CYMBALTA) 30 MG capsule Take 30 mg by mouth daily.    Historical Provider, MD   EPINEPHrine (EPIPEN IJ) Inject as directed.    Historical Provider, MD  ESZOPICLONE 3 MG tablet Take 3 mg by mouth as needed.  04/29/13   Historical Provider, MD  furosemide (LASIX) 20 MG tablet Take 20 mg by mouth daily as needed. For fluid retention. 11/16/12   Delfina Redwood, MD  levothyroxine (SYNTHROID, LEVOTHROID) 50 MCG tablet Take 50 mcg by mouth daily before breakfast.  12/01/11 12/17/13  Radene Gunning, NP  losartan (COZAAR) 50 MG tablet Take 50 mg by mouth daily.    Historical Provider, MD  metoprolol succinate (TOPROL-XL) 50 MG 24 hr tablet TAKE 1 TABLET BY MOUTH EVERY DAY 07/04/14   Evans Lance, MD  mirtazapine (REMERON) 30 MG tablet Take 30 mg by mouth at bedtime.     Historical Provider, MD  Multiple Vitamin (MULTIVITAMIN WITH MINERALS) TABS Take 1 tablet by mouth daily.    Historical Provider, MD  oxyCODONE (OXY IR/ROXICODONE) 5 MG immediate release tablet Take 5-10 mg by mouth every 4 (four) hours as needed (for cough / pain).     Historical Provider, MD  polyethylene glycol (MIRALAX / GLYCOLAX) packet Take 17 g by mouth daily.    Historical Provider, MD  Polyvinyl Alcohol (LIQUID TEARS OP) Apply 2 drops to eye 4 (four) times daily as needed.    Historical Provider, MD  pravastatin (PRAVACHOL) 40 MG tablet Take 40 mg by mouth daily.    Historical Provider, MD  predniSONE (DELTASONE) 5 MG tablet Take 5 mg by mouth daily with breakfast.  11/16/12   Delfina Redwood, MD  ranitidine (ZANTAC) 300 MG tablet Take 300 mg by mouth daily.  04/26/13   Historical Provider, MD  senna-docusate (SENOKOT-S) 8.6-50 MG per tablet Take 1 tablet by mouth daily.    Historical Provider, MD  traZODone (DESYREL) 50 MG tablet Take 50 mg by mouth as needed for sleep.  07/24/13   Historical Provider, MD  Vitamin D, Ergocalciferol, (DRISDOL) 50000 UNITS CAPS Take 50,000 Units by mouth every 7 (seven) days. On Fridays    Historical Provider, MD  zafirlukast (ACCOLATE) 20 MG tablet Take 20 mg by mouth 2 (two)  times daily.    Historical Provider, MD   BP 126/43 mmHg  Pulse  100  Temp(Src) 99.6 F (37.6 C) (Oral)  Resp 18  Ht 5' 4.5" (1.638 m)  Wt 176 lb 6.4 oz (80.015 kg)  BMI 29.82 kg/m2  SpO2 100% Physical Exam  Constitutional: She is oriented to person, place, and time. She appears well-developed and well-nourished.  HENT:  Head: Normocephalic and atraumatic.  Right Ear: Tympanic membrane, external ear and ear canal normal.  Left Ear: Tympanic membrane, external ear and ear canal normal.  Nose: Epistaxis is observed.  Small amount of blood in the oropharynx  Eyes: EOM are normal. Pupils are equal, round, and reactive to light. No scleral icterus.  Neck: No tracheal deviation present.  Cardiovascular: Normal rate, regular rhythm and normal heart sounds.   No murmur heard. Pulmonary/Chest: Effort normal. She has wheezes (Bilateral).  Abdominal: Soft. Bowel sounds are normal. She exhibits no mass. There is no tenderness. There is no rebound and no guarding.  Colostomy on left side of abdomen  Musculoskeletal: She exhibits no edema.  Amputated ring finger to left hand (2006)  Neurological: She is alert and oriented to person, place, and time. No cranial nerve deficit.  Skin: Skin is warm and dry.  Psychiatric: She has a normal mood and affect. Her behavior is normal.  Nursing note and vitals reviewed.   ED Course  Procedures   DIAGNOSTIC STUDIES: Oxygen Saturation is 96% on RA, normal by my interpretation.    COORDINATION OF CARE: 7:11 PM Discussed treatment plan with pt at bedside and pt agreed to plan.  Medications  predniSONE (DELTASONE) tablet 60 mg (60 mg Oral Given 11/11/14 1935)  ipratropium-albuterol (DUONEB) 0.5-2.5 (3) MG/3ML nebulizer solution 3 mL (3 mLs Nebulization Given 11/11/14 1927)    Results for orders placed or performed during the hospital encounter of 11/11/14  CBC with Differential/Platelet  Result Value Ref Range   WBC 6.3 4.0 - 10.5 K/uL   RBC 2.34  (L) 3.87 - 5.11 MIL/uL   Hemoglobin 7.8 (L) 12.0 - 15.0 g/dL   HCT 24.2 (L) 36.0 - 46.0 %   MCV 103.4 (H) 78.0 - 100.0 fL   MCH 33.3 26.0 - 34.0 pg   MCHC 32.2 30.0 - 36.0 g/dL   RDW 15.4 11.5 - 15.5 %   Platelets 149 (L) 150 - 400 K/uL   Neutrophils Relative % 68 43 - 77 %   Neutro Abs 4.3 1.7 - 7.7 K/uL   Lymphocytes Relative 21 12 - 46 %   Lymphs Abs 1.3 0.7 - 4.0 K/uL   Monocytes Relative 8 3 - 12 %   Monocytes Absolute 0.5 0.1 - 1.0 K/uL   Eosinophils Relative 2 0 - 5 %   Eosinophils Absolute 0.1 0.0 - 0.7 K/uL   Basophils Relative 1 0 - 1 %   Basophils Absolute 0.1 0.0 - 0.1 K/uL  Basic metabolic panel  Result Value Ref Range   Sodium 139 135 - 145 mmol/L   Potassium 4.4 3.5 - 5.1 mmol/L   Chloride 105 101 - 111 mmol/L   CO2 25 22 - 32 mmol/L   Glucose, Bld 119 (H) 65 - 99 mg/dL   BUN 30 (H) 6 - 20 mg/dL   Creatinine, Ser 1.15 (H) 0.44 - 1.00 mg/dL   Calcium 9.7 8.9 - 10.3 mg/dL   GFR calc non Af Amer 44 (L) >60 mL/min   GFR calc Af Amer 51 (L) >60 mL/min   Anion gap 9 5 - 15   Dg Chest 2 View  11/11/2014  CLINICAL DATA:  Nose please 6 hours. Decreased red blood cells and hemoglobin.  EXAM: CHEST  2 VIEW  COMPARISON:  11/13/2012 and 11/08/2012  FINDINGS: Left-sided pacemaker is intact and unchanged. Lungs are adequately inflated as patient is slightly rotated to the right. There is no focal consolidation or effusion. Cardiomediastinal silhouette is within normal. There is a stable calcified granuloma over the left upper lobe. Old right mid lateral rib fractures. Degenerative changes of the spine are present.  IMPRESSION: No active cardiopulmonary disease.   Electronically Signed   By: Marin Olp M.D.   On: 11/11/2014 20:54   [place] Extrem Up Left Ltd  10/22/2014   CLINICAL DATA:  History of left breast cancer. The patient was called back from screening mammogram for a possible mass in left breast. The diagnostic study was dictated separately. Patient complains of focal pain  in the left axilla.  EXAM: ULTRASOUND OF THE LEFT BREAST  COMPARISON:  Previous exam(s).  FINDINGS: On physical exam, I do not palpate a mass in the upper-outer quadrant of the left breast or the left axilla.  Targeted ultrasound is performed, showing normal tissue in the left axilla. No enlarged adenopathy is visualized. There is no suspicious mass in the upper-outer quadrant of the left breast.  IMPRESSION: Negative left diagnostic mammogram and ultrasound.  RECOMMENDATION: Bilateral screening mammogram in 1 year is recommended.  I have discussed the findings and recommendations with the patient. Results were also provided in writing at the conclusion of the visit. If applicable, a reminder letter will be sent to the patient regarding the next appointment.  BI-RADS CATEGORY  2: Benign Finding(s)   Electronically Signed   By: Lillia Mountain M.D.   On: 10/22/2014 15:40   Mm Digital Screening Bilateral  10/17/2014   CLINICAL DATA:  Screening.  EXAM: DIGITAL SCREENING BILATERAL MAMMOGRAM WITH CAD  COMPARISON:  Previous exam(s).  ACR Breast Density Category b: There are scattered areas of fibroglandular density.  FINDINGS: In the left breast, a possible mass warrants further evaluation. In the right breast, no findings suspicious for malignancy.  Images were processed with CAD.  IMPRESSION: Further evaluation is suggested for possible mass in the left breast.  RECOMMENDATION: Diagnostic mammogram and possibly ultrasound of the left breast. (Code:[id number])  The patient will be contacted regarding the findings, and additional imaging will be scheduled.  BI-RADS CATEGORY  0: Incomplete. Need additional imaging evaluation and/or prior mammograms for comparison.   Electronically Signed   By: Abelardo Diesel M.D.   On: 10/17/2014 15:31   Mm Diag Breast Tomo Uni Left  10/22/2014   CLINICAL DATA:  History of left breast cancer status post lumpectomy in 2004. Patient was called back from screening mammogram for a possible left  breast mass.  EXAM: DIGITAL DIAGNOSTIC LEFT MAMMOGRAM WITH 3D TOMOSYNTHESIS AND CAD  COMPARISON:  With priors.  ACR Breast Density Category b: There are scattered areas of fibroglandular density.  FINDINGS: Stable lumpectomy changes are seen in the left breast. No persistent mass or malignant type microcalcifications identified on the additional imaging.  Mammographic images were processed with CAD.  IMPRESSION: No evidence of malignancy in the left breast.  RECOMMENDATION: Bilateral screening mammogram in 1 year is recommended.  I have discussed the findings and recommendations with the patient. Results were also provided in writing at the conclusion of the visit. If applicable, a reminder letter will be sent to the patient regarding the next appointment.  BI-RADS CATEGORY  2: Benign.   Electronically  Signed   By: Lillia Mountain M.D.   On: 10/22/2014 15:03    EKG Interpretation   Date/Time:  Tuesday Nov 11 2014 21:07:08 EDT Ventricular Rate:  81 PR Interval:  182 QRS Duration: 134 QT Interval:  412 QTC Calculation: 478 R Axis:   -80 Text Interpretation:  Atrial-sensed ventricular-paced rhythm Abnormal ECG  Confirmed by Yancy Knoble  MD, Lamaj Metoyer [phone]) on 11/11/2014 9:22:27 PM       Patient with right nares nosebleed. The Rhino Rocket placed 5.5 cm in length balloon inflated. Bleeding controlled. The [employer] was a lubricated with bacitracin ointment prior to placing it. Patient tolerated procedure well no complicating factors.   MDM   Final diagnoses:  Epistaxis  Anemia, unspecified anemia type  COPD with acute exacerbation  Cough   Patient's wheezing due to her COPD exacerbation controlled well with the duo nebulizer of albuterol and Atrovent. Patient also received 60 mg prednisone by mouth. Chest x-ray is pending. The wheezing has resolved. Patient's persisting cough is aggravating the nosebleed. So a Rhino Rocket was placed in the right nares and balloon inflated with stoppage of the  bleeding.  Patient has a significant anemia this is not all due to the nosebleed. Patient's hemoglobin recently was 8.8. Probably has a slow GI blood loss. The patient does have a colostomy bag. There's been no red blood in the colostomy and stools have not been black.  Patient will be admitted for the anemia and blood transfusion. Patient accepted by the hospitalist service a [place].  I personally performed the services described in this documentation, which was scribed in my presence. The recorded information has been reviewed and is accurate.       Miranda Sorrow, MD 11/11/14 2049  Miranda Sorrow, MD 11/11/14 2122

## 2014-11-12 LAB — COMPREHENSIVE METABOLIC PANEL
ALT: 24 U/L (ref 14–54)
AST: 26 U/L (ref 15–41)
Albumin: 3.7 g/dL (ref 3.5–5.0)
Alkaline Phosphatase: 39 U/L (ref 38–126)
Anion gap: 11 (ref 5–15)
BUN: 25 mg/dL — AB (ref 6–20)
CO2: 21 mmol/L — AB (ref 22–32)
CREATININE: 1.14 mg/dL — AB (ref 0.44–1.00)
Calcium: 9.2 mg/dL (ref 8.9–10.3)
Chloride: 107 mmol/L (ref 101–111)
GFR calc Af Amer: 52 mL/min — ABNORMAL LOW (ref >60)
GFR calc non Af Amer: 45 mL/min — ABNORMAL LOW (ref >60)
GLUCOSE: 127 mg/dL — AB (ref 65–99)
POTASSIUM: 4.3 mmol/L (ref 3.5–5.1)
SODIUM: 139 mmol/L (ref 135–145)
TOTAL PROTEIN: 5.8 g/dL — AB (ref 6.5–8.1)
Total Bilirubin: 0.5 mg/dL (ref 0.3–1.2)

## 2014-11-12 LAB — CBC
HEMATOCRIT: 22.2 % — AB (ref 36.0–46.0)
HEMOGLOBIN: 7.4 g/dL — AB (ref 12.0–15.0)
MCH: 33.5 pg (ref 26.0–34.0)
MCHC: 33.3 g/dL (ref 30.0–36.0)
MCV: 100.5 fL — AB (ref 78.0–100.0)
PLATELETS: 149 10*3/uL — AB (ref 150–400)
RBC: 2.21 MIL/uL — ABNORMAL LOW (ref 3.87–5.11)
RDW: 15.9 % — ABNORMAL HIGH (ref 11.5–15.5)
WBC: 7.7 10*3/uL (ref 4.0–10.5)

## 2014-11-12 LAB — MRSA PCR SCREENING: MRSA BY PCR: POSITIVE — AB

## 2014-11-12 LAB — LACTATE DEHYDROGENASE: LDH: 492 U/L — ABNORMAL HIGH (ref 98–192)

## 2014-11-12 LAB — PREPARE RBC (CROSSMATCH)

## 2014-11-12 MED ORDER — FAMOTIDINE 20 MG PO TABS
20.0000 mg | ORAL_TABLET | Freq: Two times a day (BID) | ORAL | Status: DC
  Administered 2014-11-12 – 2014-11-16 (×9): 20 mg via ORAL
  Filled 2014-11-12 (×11): qty 1

## 2014-11-12 MED ORDER — PANTOPRAZOLE SODIUM 40 MG IV SOLR
40.0000 mg | Freq: Two times a day (BID) | INTRAVENOUS | Status: DC
  Administered 2014-11-12 (×2): 40 mg via INTRAVENOUS
  Filled 2014-11-12 (×5): qty 40

## 2014-11-12 MED ORDER — LEVOTHYROXINE SODIUM 50 MCG PO TABS
50.0000 ug | ORAL_TABLET | Freq: Every day | ORAL | Status: DC
  Administered 2014-11-12 – 2014-11-16 (×5): 50 ug via ORAL
  Filled 2014-11-12 (×6): qty 1

## 2014-11-12 MED ORDER — METOPROLOL SUCCINATE ER 50 MG PO TB24
50.0000 mg | ORAL_TABLET | Freq: Every day | ORAL | Status: DC
  Administered 2014-11-12 – 2014-11-16 (×5): 50 mg via ORAL
  Filled 2014-11-12 (×5): qty 1

## 2014-11-12 MED ORDER — SODIUM CHLORIDE 0.9 % IV SOLN
INTRAVENOUS | Status: AC
  Administered 2014-11-12: 01:00:00 via INTRAVENOUS

## 2014-11-12 MED ORDER — ALPRAZOLAM 0.25 MG PO TABS
0.2500 mg | ORAL_TABLET | ORAL | Status: DC | PRN
  Administered 2014-11-12 – 2014-11-15 (×8): 0.25 mg via ORAL
  Filled 2014-11-12 (×8): qty 1

## 2014-11-12 MED ORDER — WHITE PETROLATUM GEL
Status: AC
  Administered 2014-11-12: 0.2
  Filled 2014-11-12: qty 1

## 2014-11-12 MED ORDER — OXYCODONE HCL 5 MG PO TABS
5.0000 mg | ORAL_TABLET | ORAL | Status: DC | PRN
  Administered 2014-11-12: 10 mg via ORAL
  Filled 2014-11-12 (×2): qty 2

## 2014-11-12 MED ORDER — SODIUM CHLORIDE 0.9 % IJ SOLN
3.0000 mL | Freq: Two times a day (BID) | INTRAMUSCULAR | Status: DC
  Administered 2014-11-12 – 2014-11-16 (×9): 3 mL via INTRAVENOUS

## 2014-11-12 MED ORDER — CHLORHEXIDINE GLUCONATE CLOTH 2 % EX PADS
6.0000 | MEDICATED_PAD | Freq: Every day | CUTANEOUS | Status: DC
  Administered 2014-11-12 – 2014-11-16 (×3): 6 via TOPICAL

## 2014-11-12 MED ORDER — POLYETHYLENE GLYCOL 3350 17 G PO PACK
17.0000 g | PACK | Freq: Every day | ORAL | Status: DC
  Administered 2014-11-12 – 2014-11-16 (×5): 17 g via ORAL
  Filled 2014-11-12 (×5): qty 1

## 2014-11-12 MED ORDER — LOSARTAN POTASSIUM 50 MG PO TABS
50.0000 mg | ORAL_TABLET | Freq: Every day | ORAL | Status: DC
  Administered 2014-11-12 – 2014-11-16 (×5): 50 mg via ORAL
  Filled 2014-11-12 (×5): qty 1

## 2014-11-12 MED ORDER — DULOXETINE HCL 30 MG PO CPEP
30.0000 mg | ORAL_CAPSULE | Freq: Every day | ORAL | Status: DC
  Administered 2014-11-12 – 2014-11-16 (×5): 30 mg via ORAL
  Filled 2014-11-12 (×5): qty 1

## 2014-11-12 MED ORDER — MUPIROCIN 2 % EX OINT
1.0000 "application " | TOPICAL_OINTMENT | Freq: Two times a day (BID) | CUTANEOUS | Status: DC
  Administered 2014-11-12 – 2014-11-16 (×9): 1 via NASAL
  Filled 2014-11-12 (×2): qty 22

## 2014-11-12 MED ORDER — PANTOPRAZOLE SODIUM 40 MG PO TBEC
40.0000 mg | DELAYED_RELEASE_TABLET | Freq: Every day | ORAL | Status: DC
  Administered 2014-11-12: 40 mg via ORAL
  Filled 2014-11-12 (×2): qty 1

## 2014-11-12 MED ORDER — ADULT MULTIVITAMIN W/MINERALS CH
1.0000 | ORAL_TABLET | Freq: Every day | ORAL | Status: DC
  Administered 2014-11-12 – 2014-11-16 (×5): 1 via ORAL
  Filled 2014-11-12 (×5): qty 1

## 2014-11-12 MED ORDER — BACLOFEN 10 MG PO TABS
10.0000 mg | ORAL_TABLET | Freq: Three times a day (TID) | ORAL | Status: DC | PRN
  Filled 2014-11-12: qty 1

## 2014-11-12 MED ORDER — ALBUTEROL SULFATE (2.5 MG/3ML) 0.083% IN NEBU
2.5000 mg | INHALATION_SOLUTION | RESPIRATORY_TRACT | Status: DC | PRN
  Administered 2014-11-13: 2.5 mg via RESPIRATORY_TRACT
  Filled 2014-11-12: qty 3

## 2014-11-12 MED ORDER — PRAVASTATIN SODIUM 40 MG PO TABS
40.0000 mg | ORAL_TABLET | Freq: Every day | ORAL | Status: DC
  Administered 2014-11-12 – 2014-11-15 (×4): 40 mg via ORAL
  Filled 2014-11-12 (×5): qty 1

## 2014-11-12 MED ORDER — MONTELUKAST SODIUM 10 MG PO TABS
10.0000 mg | ORAL_TABLET | Freq: Every day | ORAL | Status: DC
  Administered 2014-11-12 – 2014-11-15 (×4): 10 mg via ORAL
  Filled 2014-11-12 (×6): qty 1

## 2014-11-12 MED ORDER — SENNOSIDES-DOCUSATE SODIUM 8.6-50 MG PO TABS
1.0000 | ORAL_TABLET | Freq: Every day | ORAL | Status: DC
  Administered 2014-11-13 – 2014-11-16 (×4): 1 via ORAL
  Filled 2014-11-12 (×4): qty 1

## 2014-11-12 MED ORDER — BUDESONIDE 0.25 MG/2ML IN SUSP
2.0000 mL | Freq: Two times a day (BID) | RESPIRATORY_TRACT | Status: DC
  Administered 2014-11-12 – 2014-11-16 (×7): 0.25 mg via RESPIRATORY_TRACT
  Filled 2014-11-12 (×13): qty 2

## 2014-11-12 MED ORDER — ALBUTEROL SULFATE (2.5 MG/3ML) 0.083% IN NEBU
3.0000 mL | INHALATION_SOLUTION | Freq: Two times a day (BID) | RESPIRATORY_TRACT | Status: DC
  Administered 2014-11-12 – 2014-11-16 (×8): 3 mL via RESPIRATORY_TRACT
  Filled 2014-11-12 (×10): qty 3

## 2014-11-12 MED ORDER — PREDNISONE 5 MG PO TABS
5.0000 mg | ORAL_TABLET | Freq: Every day | ORAL | Status: DC
  Administered 2014-11-12 – 2014-11-16 (×5): 5 mg via ORAL
  Filled 2014-11-12 (×6): qty 1

## 2014-11-12 MED ORDER — SODIUM CHLORIDE 0.9 % IV SOLN: Freq: Once | INTRAVENOUS | Status: DC

## 2014-11-12 MED ORDER — PREDNISONE 50 MG PO TABS
50.0000 mg | ORAL_TABLET | Freq: Once | ORAL | Status: AC
  Administered 2014-11-12: 50 mg via ORAL
  Filled 2014-11-12: qty 1

## 2014-11-12 MED ORDER — VITAMIN D (ERGOCALCIFEROL) 1.25 MG (50000 UNIT) PO CAPS
50000.0000 [IU] | ORAL_CAPSULE | ORAL | Status: DC
  Administered 2014-11-14: 50000 [IU] via ORAL
  Filled 2014-11-12: qty 1

## 2014-11-12 MED ORDER — DIPHENHYDRAMINE HCL 25 MG PO CAPS: 25.0000 mg | ORAL_CAPSULE | Freq: Every evening | ORAL | Status: DC | PRN

## 2014-11-12 MED ORDER — MIRTAZAPINE 30 MG PO TABS
30.0000 mg | ORAL_TABLET | Freq: Every day | ORAL | Status: DC
  Administered 2014-11-12 – 2014-11-15 (×4): 30 mg via ORAL
  Filled 2014-11-12 (×6): qty 1

## 2014-11-12 NOTE — Progress Notes (Signed)
Patient seen and evaluated earlier this a.m. but my associate. Please refer to H&P for details regarding assessment and plan.  We'll contact gastroenterology for consult from their standpoint given patient's complicated GI history. She does report that it's been more than 5 years since her eye or colonoscopy. Of note patient has had exploratory laparotomy for perforated diverticulitis is 5/13 and had wound complications.  The patient denies any blood in her stoma.  Given profound decrease in hemoglobin not explained by [facility] consulted GI for further recommendations from their standpoint.  [name], [employer]

## 2014-11-12 NOTE — Evaluation (Signed)
Physical Therapy Evaluation Patient Details Name: [name] MRN: [id number] DOB: 06-06-1934 Today's Date: 11/12/2014   History of Present Illness  81 yo female with hx of anemia, diverticulosis, angiodysplasia of stomach, colon, vitamin b12 def apparently c/o feeling weak. Admitted with anemia and COPD exacerbation.  Clinical Impression  Pt admitted with the above complications. Pt currently with functional limitations due to the deficits listed below (see PT Problem List). Ambulates generally well with the use of an assistive device. Fatigues approximate twice as fast as her baseline (per pt report) 3/4 dyspnea on exertion with SpO2 down to 87% on room air, improves with sitting and pursed lip breathing to mid 90s. May benefit from HHPT at d/c to improve her strength, endurance, and functional independence. Will continue to monitior progression and update recommendations as appropriate. Pt will benefit from skilled PT to increase their independence and safety with mobility to allow discharge to the venue listed below.       Follow Up Recommendations Home health PT (Return to ALF)    Equipment Recommendations  None recommended by PT    Recommendations for Other Services       Precautions / Restrictions Precautions Precautions: None Precaution Comments: monitor O2 Restrictions Weight Bearing Restrictions: No      Mobility  Bed Mobility Overal bed mobility: Modified Independent                Transfers Overall transfer level: Needs assistance Equipment used: Rolling walker (2 wheeled) Transfers: Sit to/from Stand Sit to Stand: Supervision         General transfer comment: supervision for safety. VC for hand placement.  Ambulation/Gait Ambulation/Gait assistance: Supervision Ambulation Distance (Feet): 200 Feet Assistive device: Rolling walker (2 wheeled) Gait Pattern/deviations: Step-through pattern;Decreased stride length;Trunk flexed Gait velocity: decreased   General Gait Details: Required single standing rest break half-way due to SOB (SpO2 not registering on pulse ox) and to lean on RW due to back soreness. VC for upright posture as tolerated, pursed lip breathing and walker placement at times for proximity.  Stairs            Wheelchair Mobility    Modified Rankin (Stroke Patients Only)       Balance Overall balance assessment: Needs assistance Sitting-balance support: No upper extremity supported;Feet supported Sitting balance-Leahy Scale: Good     Standing balance support: No upper extremity supported Standing balance-Leahy Scale: Fair                               Pertinent Vitals/Pain Pain Assessment: No/denies pain    Home Living Family/patient expects to be discharged to:: Assisted living Living Arrangements: Alone   Type of Home: Assisted living Home Access: Level entry     Home Layout: One level Home Equipment: Walker - 4 wheels;Grab bars - toilet (uses a rollator to sit while taking a shower) Additional Comments: From ALF, states she is independent with a rollator there and still drives.    Prior Function Level of Independence: Independent with assistive device(s)         Comments: bath/dress self, uses rollator for mobility     Hand Dominance   Dominant Hand:  (both)    Extremity/Trunk Assessment   Upper Extremity Assessment: Defer to OT evaluation           Lower Extremity Assessment: Generalized weakness         Communication   Communication: No difficulties  Cognition Arousal/Alertness: Awake/alert Behavior During Therapy: WFL for tasks assessed/performed Overall Cognitive Status: Within Functional Limits for tasks assessed                      General Comments General comments (skin integrity, edema, etc.): SOB upon returning to room SpO2 87% on room air, cues for pursed lip breathing returns SpO2 into mid 90s.    Exercises General Exercises - Lower  Extremity Ankle Circles/Pumps: AROM;Both;10 reps;Seated Long Arc Quad: Strengthening;Both;10 reps;Seated Hip Flexion/Marching: Strengthening;Both;10 reps;Seated      Assessment/Plan    PT Assessment Patient needs continued PT services  PT Diagnosis Difficulty walking;Generalized weakness   PT Problem List Decreased strength;Decreased activity tolerance;Decreased balance;Decreased mobility;Cardiopulmonary status limiting activity  PT Treatment Interventions DME instruction;Gait training;Functional mobility training;Therapeutic activities;Therapeutic exercise;Balance training;Patient/family education   PT Goals (Current goals can be found in the Care Plan section) Acute Rehab PT Goals Patient Stated Goal: Go back home to ALF PT Goal Formulation: With patient Time For Goal Achievement: 11/26/14 Potential to Achieve Goals: Good    Frequency Min 3X/week   Barriers to discharge Decreased caregiver support lives alone at ALF.    Co-evaluation               End of Session   Activity Tolerance: Patient tolerated treatment well Patient left: in bed;with call bell/phone within reach Nurse Communication: Mobility status         Time: 1829-9371 PT Time Calculation (min) (ACUTE ONLY): 31 min   Charges:   PT Evaluation $Initial PT Evaluation Tier I: 1 Procedure PT Treatments $Gait Training: 8-22 mins   PT G CodesEllouise [name] 11/12/2014, 6:16 PM [name] [place], [place]

## 2014-11-12 NOTE — Progress Notes (Signed)
Utilization Review completed. Zaniyah Wernette RN BSN CM 

## 2014-11-12 NOTE — Progress Notes (Signed)
CRITICAL VALUE ALERT  Critical value received:  Positive MRSA PCR  Date of notification: 11/12/2014  Time of notification:  0508  Critical value read back:Yes.    Nurse who received alert:  Lysle Rubens, RN   MD notified (1st page):  [facility]  Time of first page:  0510  Responding MD:  Lamar Blinks, MD  Time MD responded:  [phone]  Standing MRSA orders have been initiated. Discussed result with pt and she is aware of contact precautions for both herself and staff. Pt is also aware that we will start Bactroban to help resolve her MRSA in the nares. Pt has been educated on care plan and is in agreement. Will continue to educate and monitor.

## 2014-11-12 NOTE — Consult Note (Addendum)
WOC ostomy consult note Stoma type/location:  Consult requested for ostomy assistance.  Pt states she had ostomy surgery several years ago and was independent with ostomy pouching until she entered an assisted living facility; the nurses there assist her at this time.  She is very well-informed regarding ostomy supplies and pouching routines.  She has developed a difficult pouching situation R/T a peristomal hernia and a large stoma size. She is not interested in further surgery to correct the hernia. Stomal assessment/size:  Stoma is 2 3/4 inches, slightly above skin level, visible peristomal hernia; wafer must be cut at the last possible outer line. There is a red raised nodule to 3:00 o'clock on the outer stoma; suspect it is related to pressure from the inner 2 piece ring of the pouching system.  No pain or bleeding from the nodule. She has tried other larger pouches but they were not effective.  She obtains 4 days of wear time using the current pouching system. Peristomal assessment: Few scattered areas of medical adhesive related skin damage from adhesive.  Red and moist with small amt bleeding.Skin prep applied and adhesive remover used for previous pouch removal. Output  50cc semi-formed brown stool in the pouch Ostomy pouching: 2pc pouch applied with ostomy belt to hold in place.  Education provided: Pt has own supplies at bedside.  Looked at various ostomy catalogues with her and discussed other products for possible use after discharge.  Offered to have free samples sent to patient's house but she declined. One hour spent with pouch change and discussing other ostomy products. She denies further questions at this time. Bedside nurses can assist with pouch application and emptying. Please re-consult if further assistance is needed.  Thank-you,  Julien Girt MSN, [place], [place], [place], [place]

## 2014-11-12 NOTE — Consult Note (Signed)
[facility] Consult: 9:28 AM 11/13/2014  LOS: 2 days    Referring Provider: Dr Wendee Beavers  Primary Care Physician:  [name], MD Primary Gastroenterologist:  Dr. Carlean Purl    Reason for Consultation:  Anemia, macrocytic.    HPI: [name] is a 81 y.o. female.   Hx diverticulitis with contained sigmoid perforation/abscess 2012, recurrent perforation leading to ex lap/sigmoid resection/end colostomy/gastrostomy 10/2011.  c diff in 2012. Stage 3 CKD.  Hx sarcoma. Hx breast cancer. COPD on 5 mg prednisone daily. Cardiac pacemaker in place.  Hx of b12 deficiency and IDA.  S/p EGD/colonoscopy 2010 with ablation of gastric, duodenal, cecal AVMs.  Also HP gastric polyp 2010.  Takes 300 mg Ranitidine daily.  Followed by [name], hematologist.  Last visit was 09/11/14, hgb 12.4 with plans to return to clinic in 08/2015.  She did have c/o fatigue even then.  Hx allergic reaction to iron dextran but no problem with Feraheme.    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  Hgb was 8.8.  Submitted stool specimens and hemasure was + on 5/24.  Developed epistaxis lasting 5 or 6 hours 5/24 and hgb 7.8 at hospital admit, 7.4 day 2. Today Hgb up to 9 post  PRBCs x 2.  MCV 103.     Has had issues with minor peristomal bleeding for at least a year; occurs when she removes stoma appliance adhesive.  Stool itself is hard, occurs in ball formation, never has blood in stool.  She requires nightly senekot and prn Miralax for successful elimination.   Ostomy RN note of 5/25 relays " red raised nodule to 3:00 o'clock on the outer stoma; suspect it is related to pressure from the inner 2 piece ring of the pouching system.....  Few scattered areas of medical adhesive related skin damage from adhesive. Red  and moist with small amt bleeding"  Appetite is fair, she eats red meat.  No abd pain, no n/v.   Past Medical History  Diagnosis Date  . Insomnia, unspecified   . Osteoarthrosis, unspecified whether generalized or localized, unspecified site   . Unspecified hypothyroidism   . Unspecified essential hypertension   . Atrial fibrillation   . Sciatica   . Anemia   . COPD (chronic obstructive pulmonary disease)   . UTI (urinary tract infection)   . GERD (gastroesophageal reflux disease)   . Pneumonia   . Asthma   . Clostridium difficile colitis   . Diverticulitis of sigmoid colon 11/2010    with abscess/contained perforation  . Dementia     mild  . Dehydration with hyponatremia 12/2010    hospitalized  . Angiodysplasia of stomach     ablated 2010  . Angiodysplasia of colon     ablated 2010  . Vitamin B12 deficiency (dietary) anemia 04/12/2011  . Shingles   . Sarcoma     right hand  . Malignant neoplasm of breast (female), unspecified site     left  . Dupuytren's disease     left hand  .  Vitamin B12 deficiency 04/02/2012  . Complication of anesthesia     " very cautious because of my COPD"  . Pacemaker     Past Surgical History  Procedure Laterality Date  . Nasal sinus surgery      x 3  . Appendectomy    . Hernia repair    . Abdominal hysterectomy    . Pacemaker insertion    . Eye surgery      x6-bilateral  . Finger amputation      right  . Foot surgery      right toes x 3  . Tonsillectomy    . Colonoscopy  01/2009    w/ablation of 3 mm AVM, diverticulosis, external hemorrhoids  . Upper gastrointestinal endoscopy  01/2009    w/polypectomy, AVM, hyperplastic polyp  . Abdominal hysterectomy    . Laparotomy  11/12/2011    Procedure: EXPLORATORY LAPAROTOMY;  Surgeon: Madilyn Hook, DO;  Location: [facility];  Service: General;  Laterality: N/A;  . Colostomy revision  11/12/2011    Procedure: COLON RESECTION SIGMOID;  Surgeon: Madilyn Hook, DO;  Location: [facility];  Service:  General;  Laterality: N/A;  . Colostomy  11/12/2011    Procedure: COLOSTOMY;  Surgeon: Madilyn Hook, DO;  Location: [facility];  Service: General;  Laterality: Left;  End colostomy  . Laparotomy  11/19/2011    Procedure: EXPLORATORY LAPAROTOMY;  Surgeon: Shann Medal, MD;  Location: [facility];  Service: General;  Laterality: N/A;  . Gastrostomy  11/19/2011    Procedure: GASTROSTOMY;  Surgeon: Shann Medal, MD;  Location: [facility];  Service: General;;  INSERTION GASTROSTOMY TUBE  . Application of wound vac  11/19/2011    Procedure: APPLICATION OF WOUND VAC;  Surgeon: Shann Medal, MD;  Location: [facility];  Service: General;  Laterality: N/A;    Prior to Admission medications   Medication Sig Start Date End Date Taking? Authorizing Provider  budesonide (RHINOCORT AQUA) 32 MCG/ACT nasal spray  09/25/09  Yes Historical Provider, MD  hydrocortisone cream 1 % Apply 1 application topically once as needed for itching.   Yes Historical Provider, MD  triamcinolone cream (KENALOG) 0.1 % Apply 1 application topically as needed.   Yes Historical Provider, MD  acetaminophen (TYLENOL) 325 MG tablet Take 650 mg by mouth as needed.    Historical Provider, MD  albuterol (PROVENTIL HFA;VENTOLIN HFA) 108 (90 BASE) MCG/ACT inhaler Inhale 2 puffs into the lungs 2 (two) times daily.    Historical Provider, MD  albuterol (PROVENTIL) (2.5 MG/3ML) 0.083% nebulizer solution Take 2.5 mg by nebulization every 4 (four) hours as needed for wheezing or shortness of breath.    Historical Provider, MD  ALPRAZolam Duanne Moron) 0.25 MG tablet Take 0.25 mg by mouth every 4 (four) hours as needed for anxiety.    Historical Provider, MD  baclofen (LIORESAL) 10 MG tablet Take 10 mg by mouth every 8 (eight) hours as needed.    Historical Provider, MD  beclomethasone (QVAR) 80 MCG/ACT inhaler Inhale 3 puffs into the lungs 2 (two) times daily.     Historical Provider, MD  Budesonide (RHINOCORT AQUA NA) Place 1 spray into both nostrils daily.      Historical Provider, MD  budesonide (RHINOCORT AQUA) 32 MCG/ACT nasal spray Place into both nostrils daily as needed. For stuffy nose or drainage 08/25/14   Historical Provider, MD  chlorpheniramine-HYDROcodone (TUSSIONEX) 10-8 MG/5ML LQCR Take 5 mLs by mouth at bedtime as needed (for cough).    Historical Provider, MD  DEXILANT 60 MG capsule Take 60 mg by mouth daily before breakfast. 09/16/14   Historical Provider, MD  Dexlansoprazole (DEXILANT) 30 MG capsule Take 60 mg by mouth daily.    Historical Provider, MD  diphenhydrAMINE (BENADRYL) 25 mg capsule Take 25 mg by mouth as needed.    Historical Provider, MD  DULoxetine (CYMBALTA) 30 MG capsule Take 30 mg by mouth daily.    Historical Provider, MD  EPINEPHrine (EPIPEN IJ) Inject as directed.    Historical Provider, MD  ESZOPICLONE 3 MG tablet Take 3 mg by mouth as needed.  04/29/13   Historical Provider, MD  furosemide (LASIX) 20 MG tablet Take 20 mg by mouth daily as needed. For fluid retention. 11/16/12   Delfina Redwood, MD  gabapentin (NEURONTIN) 100 MG capsule Take 100 mg by mouth daily. 10/30/14   Historical Provider, MD  ketoconazole (NIZORAL) 2 % cream Apply topically every morning. Apply to affected area (rash). 09/22/14   Historical Provider, MD  levothyroxine (SYNTHROID, LEVOTHROID) 50 MCG tablet Take 50 mcg by mouth daily before breakfast.  12/01/11 12/17/13  Radene Gunning, NP  levothyroxine (SYNTHROID, LEVOTHROID) 50 MCG tablet Take 50 mcg by mouth daily. 09/22/14   Historical Provider, MD  losartan (COZAAR) 50 MG tablet Take 50 mg by mouth daily.    Historical Provider, MD  metoprolol succinate (TOPROL-XL) 50 MG 24 hr tablet TAKE 1 TABLET BY MOUTH EVERY DAY 07/04/14   Evans Lance, MD  mirtazapine (REMERON) 15 MG tablet Take 15 mg by mouth daily. 09/16/14   Historical Provider, MD  mirtazapine (REMERON) 30 MG tablet Take 30 mg by mouth at bedtime.     Historical Provider, MD  Multiple Vitamin (MULTIVITAMIN WITH MINERALS) TABS Take 1  tablet by mouth daily.    Historical Provider, MD  oxyCODONE (OXY IR/ROXICODONE) 5 MG immediate release tablet Take 5-10 mg by mouth every 4 (four) hours as needed (for cough / pain).     Historical Provider, MD  polyethylene glycol (MIRALAX / GLYCOLAX) packet Take 17 g by mouth daily.    Historical Provider, MD  Polyvinyl Alcohol (LIQUID TEARS OP) Apply 2 drops to eye 4 (four) times daily as needed.    Historical Provider, MD  pravastatin (PRAVACHOL) 40 MG tablet Take 40 mg by mouth daily.    Historical Provider, MD  predniSONE (DELTASONE) 5 MG tablet Take 5 mg by mouth daily with breakfast.  11/16/12   Delfina Redwood, MD  ranitidine (ZANTAC) 300 MG tablet Take 300 mg by mouth daily.  04/26/13   Historical Provider, MD  senna-docusate (SENOKOT-S) 8.6-50 MG per tablet Take 1 tablet by mouth daily.    Historical Provider, MD  traZODone (DESYREL) 50 MG tablet Take 50 mg by mouth as needed for sleep.  07/24/13   Historical Provider, MD  Vitamin D, Ergocalciferol, (DRISDOL) 50000 UNITS CAPS Take 50,000 Units by mouth every 7 (seven) days. On Fridays    Historical Provider, MD  Arvid Right 150 MG injection  09/15/14   Historical Provider, MD  zafirlukast (ACCOLATE) 20 MG tablet Take 20 mg by mouth 2 (two) times daily.    Historical Provider, MD    Scheduled Meds: . sodium chloride   Intravenous Once  . albuterol  3 mL Inhalation BID  . budesonide  2 mL Inhalation BID  . Chlorhexidine Gluconate Cloth  6 each Topical Q0600  . DULoxetine  30 mg Oral Daily  . famotidine  20 mg Oral BID  . levothyroxine  50 mcg  Oral QAC breakfast  . losartan  50 mg Oral Daily  . metoprolol succinate  50 mg Oral Daily  . mirtazapine  30 mg Oral QHS  . montelukast  10 mg Oral QHS  . multivitamin with minerals  1 tablet Oral Daily  . mupirocin ointment  1 application Nasal BID  . pantoprazole  40 mg Oral Daily  . pantoprazole (PROTONIX) IV  40 mg Intravenous Q12H  . polyethylene glycol  17 g Oral Daily  . pravastatin  40  mg Oral q1800  . predniSONE  5 mg Oral Q breakfast  . senna-docusate  1 tablet Oral Daily  . sodium chloride  3 mL Intravenous Q12H  . [START ON 11/14/2014] Vitamin D (Ergocalciferol)  50,000 Units Oral Q Fri   Infusions:   PRN Meds: albuterol, ALPRAZolam, baclofen, diphenhydrAMINE, oxyCODONE   Allergies as of 11/11/2014 - Review Complete 11/11/2014  Allergen Reaction Noted  . Aspirin Shortness Of Breath 09/20/2007  . Sulfonamide derivatives Hives   . Ibuprofen Other (See Comments) 09/20/2007  . Iron dextran Itching 01/08/2009  . Sulfa antibiotics Hives 12/17/2013  . Metoclopramide hcl Other (See Comments) 01/12/2009    Family History  Problem Relation Age of Onset  . Emphysema Paternal Uncle   . Heart disease Mother   . Colon cancer Mother   . Pancreatic cancer Father   . Cancer Father 62    pancreatic cancer  . Tuberculosis Paternal Uncle   . Breast cancer Maternal Aunt   . Breast cancer Cousin   . Prostate cancer Cousin   . Colon cancer Cousin   . Uterine cancer Cousin     History   Social History  . Marital Status: Widowed    Spouse Name: N/A  . Number of Children: 3  . Years of Education: N/A   Occupational History  . Home Maker    Social History Main Topics  . Smoking status: Former Smoker -- 0.50 packs/day for 21 years    Types: Cigarettes    Quit date: 06/20/1977  . Smokeless tobacco: Never Used  . Alcohol Use: Yes     Comment: 1 glass wine/night  . Drug Use: No  . Sexual Activity: Not on file   Other Topics Concern  . Not on file   Social History Narrative    REVIEW OF SYSTEMS: Constitutional:  Per HPI ENT:  No nose bleeds Pulm:  Intermittent, chronic barking cough and SOB.  If sputum produced, it is foamy white or clear. Hypoxic to 87% on RA with walking in hallway.  Breast: left with surgical scars.  CV:  No palpitations, no chest pain.  Takes Furosemide once a week for  Ankle swelling.  GU:  No hematuria, no frequency GI:  Per HPI.   No dysphagia.  No rectal discharge or bleeding.  Heme:  Per HPI   Transfusions:  Per HPI Neuro:  No headaches, no peripheral tingling or numbness Derm:  No itching, no rash or sores.  Endocrine:  No sweats or chills.  No polyuria or dysuria Immunization:  Not queried.  Travel:  None beyond local counties in last few months.    PHYSICAL EXAM: Vital signs in last 24 hours: Filed Vitals:   11/13/14 0451  BP: 156/57  Pulse: 82  Temp: 97.5 F (36.4 C)  Resp: 20   Wt Readings from Last 3 Encounters:  11/13/14 176 lb 5.9 oz (80 kg)  09/10/14 174 lb 4.8 oz (79.062 kg)  12/17/13 175 lb 12.8 oz (79.742 kg)  General: looks unwell, dyspneic, pale.  Head:  No facial assymetry, swelling or signs trauma  Eyes:  EOMI.  No conj pallor.  No icterus.  Ears:  Slightly HOH  Nose:  Right nares packed with tiny tube emerging "rhino rocket" Mouth:  Clear, MM slightly dry.  No sores Neck:  No mass, no TMG Lungs:  Diminished throughout.  No rales or weezing.  Barking/wet cough.  Dyspneic with speech and after coughing.  Heart: RRR.  No MRG Abdomen:  Soft, NT, active BS.  Ostomy bag on left with formed, hard stool palpable (ostomy appliance not removed) .   Rectal: deferred   Musc/Skeltl: post surgical changes on right hand, one of the fingers is surgically absent.  Left hand Dupuytren's contracture.  Extremities:  No pedal or LE edema.  Slight, non-pitting UE swelling.  Heme:  Large purpura on upper right arm.  Neurologic:  Oriented x 3.   Skin:  Thin with purpura as above Tattoos:  none Nodes:  No cervical adenopathy   Psych:  Anxious, extensively detailed descriptions of all her current and past medical history.   Intake/Output from previous day: 05/25 0701 - 05/26 0700 In: 1290 [I.V.:250; Blood:1040] Out: 1000 [Urine:1000] Intake/Output this shift:    LAB RESULTS:  Recent Labs  11/11/14 1944 11/12/14 0720 11/13/14 0505  WBC 6.3 7.7  --   HGB 7.8* 7.4* 9.0*  HCT 24.2* 22.2*  26.5*  PLT 149* 149*  --    BMET Lab Results  Component Value Date   NA 139 11/12/2014   NA 139 11/11/2014   NA 142 06/06/2013   K 4.3 11/12/2014   K 4.4 11/11/2014   K 4.9 06/06/2013   CL 107 11/12/2014   CL 105 11/11/2014   CL 96 11/16/2012   CO2 21* 11/12/2014   CO2 25 11/11/2014   CO2 29 06/06/2013   GLUCOSE 127* 11/12/2014   GLUCOSE 119* 11/11/2014   GLUCOSE 81 06/06/2013   BUN 25* 11/12/2014   BUN 30* 11/11/2014   BUN 44.2* 06/06/2013   CREATININE 1.14* 11/12/2014   CREATININE 1.15* 11/11/2014   CREATININE 1.0 06/06/2013   CALCIUM 9.2 11/12/2014   CALCIUM 9.7 11/11/2014   CALCIUM 10.4 06/06/2013   LFT  Recent Labs  11/12/14 0720  PROT 5.8*  ALBUMIN 3.7  AST 26  ALT 24  ALKPHOS 39  BILITOT 0.5   PT/INR Lab Results  Component Value Date   INR 1.01 12/05/2011   INR 1.45 11/12/2011   INR 1.72* 11/12/2011   PROTIME 16.5 11/28/2008     Ref. Range 09/05/2013 14:43 09/10/2014 09:09  Iron Latest Ref Range: 41-142 ug/dL 118   UIBC Latest Ref Range: 120-384 ug/dL 170   TIBC Latest Ref Range: 236-444 ug/dL 288   %SAT Latest Ref Range: 21-57 % 41   Ferritin Latest Ref Range: 9-269 ng/ml 140 45   B 12 level 703 Folate level 45    RADIOLOGY STUDIES: Dg Chest 2 View  11/11/2014   CLINICAL DATA:  Nose please 6 hours. Decreased red blood cells and hemoglobin.  EXAM: CHEST  2 VIEW  COMPARISON:  11/13/2012 and 11/08/2012  FINDINGS: Left-sided pacemaker is intact and unchanged. Lungs are adequately inflated as patient is slightly rotated to the right. There is no focal consolidation or effusion. Cardiomediastinal silhouette is within normal. There is a stable calcified granuloma over the left upper lobe. Old right mid lateral rib fractures. Degenerative changes of the spine are present.  IMPRESSION: No active cardiopulmonary disease.  Electronically Signed   By: Marin Olp M.D.   On: 11/11/2014 20:54    ENDOSCOPIC STUDIES: 01/2009 EGD for iron def  anemia ENDOSCOPIC IMPRESSION: 1) Av malformations ablated in the stomach (1) and duodenum (several). 2) Sessile polyp (path hyperplastic) in the antrum, removed. Site treated with hemoclip to prevent bleeding.  3) Otherwise normal examination  01/2009 Colonoscopy ENDOSCOPIC IMPRESSION: 1) 3 mm av malformation in the cecum (ablated with APC) 2) Moderate diverticulosis in the sigmoid colon 3) External hemorrhoids 4) Otherwise normal examination  IMPRESSION:   *  Anemia, macrocytic. Hgb dropped up to 4 grams in last 2 months.  B12, folate levels normal.  Previously iron deficient but currently iron levels ok.    S/p 2 PRBCs.   *  FOBT +.  Minor peristomal bleeding when she removes ostomy adhesive but no bloody stools or melena  *  Hx AVMs on EGD and colonoscopy in 2010.  These may be a source of slow, chronic blood loss  *  Epistaxis.  Single episode for several hours on day PTA.  S/p rhino rocket to right nares.   *  COPD.  Nocturnal oxygen.  Hypoxic with walking halls. Barking cough (intermittent, chronic per pt).  Baseline 5 mg prednisone, on 50 mg currently for COPD flare.     PLAN:     *  Per Dr Hilarie Fredrickson.  Her resp issues look to be a limiting factor in pursuing endoscopic testing requiring sedation.   *  Stop IV and po Protonix.  Stick with daily H2 blocker. Given hx C diff, would avoid PPI unless documented ulcer or acid related mucosal injury.     Azucena Freed  11/13/2014, 9:28 AM Pager: [phone]

## 2014-11-13 ENCOUNTER — Inpatient Hospital Stay (HOSPITAL_COMMUNITY): Payer: Medicare Other

## 2014-11-13 DIAGNOSIS — Q2733 Arteriovenous malformation of digestive system vessel: Secondary | ICD-10-CM

## 2014-11-13 DIAGNOSIS — R040 Epistaxis: Secondary | ICD-10-CM

## 2014-11-13 DIAGNOSIS — D539 Nutritional anemia, unspecified: Principal | ICD-10-CM

## 2014-11-13 LAB — TYPE AND SCREEN
ABO/RH(D): A POS
Antibody Screen: NEGATIVE
Unit division: 0
Unit division: 0

## 2014-11-13 LAB — PROTEIN ELECTROPHORESIS, SERUM
A/G Ratio: 1.4 (ref 0.7–2.0)
Albumin ELP: 3.3 g/dL (ref 3.2–5.6)
Alpha-1-Globulin: 0.3 g/dL (ref 0.1–0.4)
Alpha-2-Globulin: 0.7 g/dL (ref 0.4–1.2)
BETA GLOBULIN: 0.9 g/dL (ref 0.6–1.3)
GLOBULIN, TOTAL: 2.4 g/dL (ref 2.0–4.5)
Gamma Globulin: 0.5 g/dL (ref 0.5–1.6)
TOTAL PROTEIN ELP: 5.7 g/dL — AB (ref 6.0–8.5)

## 2014-11-13 LAB — HEMOGLOBIN AND HEMATOCRIT, BLOOD
HCT: 26.5 % — ABNORMAL LOW (ref 36.0–46.0)
Hemoglobin: 9 g/dL — ABNORMAL LOW (ref 12.0–15.0)

## 2014-11-13 LAB — FOLATE: Folate: 45 ng/mL (ref >5.9)

## 2014-11-13 NOTE — Evaluation (Signed)
Occupational Therapy Evaluation  Patient Details Name: [name] MRN: [id number] DOB: 1933-08-11 Today's Date: 11/13/2014    History of Present Illness 81 yo female with hx of anemia, diverticulosis, angiodysplasia of stomach, colon, vitamin b12 def apparently c/o feeling weak. Admitted with anemia and COPD exacerbation.   Clinical Impression   PTA pt lived at [facility] and was independent with use of Rollator. Pt limited by DOE, fatigue, and deconditioning and requires supervision for ADLs and functional mobility. Recommend HHOT at d/c. No further acute OT needs.      Follow Up Recommendations  Home health OT (at ALF)    Equipment Recommendations  None recommended by OT    Recommendations for Other Services       Precautions / Restrictions Precautions Precautions: None Precaution Comments: monitor O2 Restrictions Weight Bearing Restrictions: No      Mobility Bed Mobility               General bed mobility comments: Pt up in recliner chair  Transfers Overall transfer level: Needs assistance Equipment used: Rolling walker (2 wheeled) Transfers: Sit to/from Stand Sit to Stand: Supervision         General transfer comment: supervision for safety. VC for hand placement.         ADL Overall ADL's : Needs assistance/impaired Eating/Feeding: Independent;Sitting   Grooming: Standing;Supervision/safety   Upper Body Bathing: Set up;Sitting   Lower Body Bathing: Supervison/ safety;Sit to/from stand   Upper Body Dressing : Set up;Sitting   Lower Body Dressing: Supervision/safety;Sit to/from stand   Toilet Transfer: Supervision/safety;Ambulation;RW   Toileting- Clothing Manipulation and Hygiene: Supervision/safety;Sit to/from stand       Functional mobility during ADLs: Supervision/safety;Rolling walker General ADL Comments: Pt limited by fatigue and DOE, required seated rest break x2. Pt uses good energy conservation techniques.      Vision Additional  Comments: No change from baseline   Perception     Praxis      Pertinent Vitals/Pain Pain Assessment: No/denies pain     Hand Dominance  (both)   Extremity/Trunk Assessment Upper Extremity Assessment Upper Extremity Assessment: Generalized weakness   Lower Extremity Assessment Lower Extremity Assessment: Generalized weakness   Cervical / Trunk Assessment Cervical / Trunk Assessment: Normal   Communication Communication Communication: No difficulties   Cognition Arousal/Alertness: Awake/alert Behavior During Therapy: WFL for tasks assessed/performed Overall Cognitive Status: Within Functional Limits for tasks assessed                     General Comments    SOB upon return to room, SPO2 98% and VC's for pursed lip breathing. Returned to mid 9s.             Home Living Family/patient expects to be discharged to:: Assisted living Living Arrangements: Alone   Type of Home: Assisted living Home Access: Level entry     Home Layout: One level               Home Equipment: Walker - 4 wheels;Grab bars - toilet (uses a rollator to sit while showering)   Additional Comments: From ALF, states she is independent with a rollator there and still drives.      Prior Functioning/Environment Level of Independence: Independent with assistive device(s)        Comments: bath/dress self, uses rollator for mobility    OT Diagnosis: Generalized weakness;Acute pain    End of Session Equipment Utilized During Treatment: Gait belt;Rolling walker  Activity Tolerance: Patient tolerated  treatment well Patient left: in chair;with call bell/phone within reach   Time: 1111-1135 OT Time Calculation (min): 24 min Charges:  OT General Charges $OT Visit: 1 Procedure OT Evaluation $Initial OT Evaluation Tier I: 1 Procedure OT Treatments $Self Care/Home Management : 8-22 mins G-Codes:    Juluis Rainier 12-08-14, 12:10 PM  Cyndie Chime,  OTR/L Occupational Therapist [phone] (pager)

## 2014-11-13 NOTE — Progress Notes (Signed)
[facility] PROGRESS NOTE  [name] QMV:[id number] DOB: 12/09/1933 DOA: 11/11/2014 PCP: [name], MD  Assessment/Plan: Active Problems:   Anemia - GI consulted and plans are for further workup once breathing condition improves    Epistaxis - Packing currently in place. Will discuss with ENT for further recommendations moving forward.    COPD exacerbation - Improving on current regimen. Patient reports improvement in condition.  Code Status: Full Family Communication: No family at bedside  Disposition Plan: Pending improvement in condition   Consultants:  Gastroenterologist  Procedures:  None  Antibiotics:  None  HPI/Subjective: Patient has no new complaints. Did have a little bit of blood tinge leak from nasal packing  Objective: Filed Vitals:   11/13/14 1349  BP: 111/67  Pulse: 64  Temp: 98.3 F (36.8 C)  Resp: 18    Intake/Output Summary (Last 24 hours) at 11/13/14 1810 Last data filed at 11/13/14 1352  Gross per 24 hour  Intake   2010 ml  Output   1300 ml  Net    710 ml   Filed Weights   11/11/14 1806 11/11/14 2323 11/13/14 0451  Weight: 80.015 kg (176 lb 6.4 oz) 79.8 kg (175 lb 14.8 oz) 80 kg (176 lb 5.9 oz)    Exam:   General:  Patient in no acute distress, alert and awake  Cardiovascular: Regular rate and rhythm, no murmurs or rubs  Respiratory: Clear to auscultation bilaterally, no wheezes  Abdomen: Soft, nondistended  Musculoskeletal: Equal tone   Data Reviewed: Basic Metabolic Panel:  Recent Labs Lab 11/11/14 1944 11/12/14 0720  NA 139 139  K 4.4 4.3  CL 105 107  CO2 25 21*  GLUCOSE 119* 127*  BUN 30* 25*  CREATININE 1.15* 1.14*  CALCIUM 9.7 9.2   Liver Function Tests:  Recent Labs Lab 11/12/14 0720  AST 26  ALT 24  ALKPHOS 39  BILITOT 0.5  PROT 5.8*  ALBUMIN 3.7   No results for input(s): LIPASE, AMYLASE in the last 168 hours. No results for input(s): AMMONIA in the last 168  hours. CBC:  Recent Labs Lab 11/11/14 1944 11/12/14 0720 11/13/14 0505  WBC 6.3 7.7  --   NEUTROABS 4.3  --   --   HGB 7.8* 7.4* 9.0*  HCT 24.2* 22.2* 26.5*  MCV 103.4* 100.5*  --   PLT 149* 149*  --    Cardiac Enzymes: No results for input(s): CKTOTAL, CKMB, CKMBINDEX, TROPONINI in the last 168 hours. BNP (last 3 results) No results for input(s): BNP in the last 8760 hours.  ProBNP (last 3 results) No results for input(s): PROBNP in the last 8760 hours.  CBG: No results for input(s): GLUCAP in the last 168 hours.  Recent Results (from the past 240 hour(s))  MRSA PCR Screening     Status: Abnormal   Collection Time: 11/12/14  2:35 AM  Result Value Ref Range Status   MRSA by PCR POSITIVE (A) NEGATIVE Final    Comment:        The GeneXpert MRSA Assay (FDA approved for NASAL specimens only), is one component of a comprehensive MRSA colonization surveillance program. It is not intended to diagnose MRSA infection nor to guide or monitor treatment for MRSA infections. RESULT CALLED TO, READ BACK BY AND VERIFIED WITH: K.[place] RN [phone] 11/12/14 E.GADDY      Studies: Dg Chest 2 View  11/11/2014   CLINICAL DATA:  Nose please 6 hours. Decreased red blood cells and hemoglobin.  EXAM: CHEST  2 VIEW  COMPARISON:  11/13/2012 and 11/08/2012  FINDINGS: Left-sided pacemaker is intact and unchanged. Lungs are adequately inflated as patient is slightly rotated to the right. There is no focal consolidation or effusion. Cardiomediastinal silhouette is within normal. There is a stable calcified granuloma over the left upper lobe. Old right mid lateral rib fractures. Degenerative changes of the spine are present.  IMPRESSION: No active cardiopulmonary disease.   Electronically Signed   By: Marin Olp M.D.   On: 11/11/2014 20:54    Scheduled Meds: . sodium chloride   Intravenous Once  . albuterol  3 mL Inhalation BID  . budesonide  2 mL Inhalation BID  . Chlorhexidine Gluconate Cloth   6 each Topical Q0600  . DULoxetine  30 mg Oral Daily  . famotidine  20 mg Oral BID  . levothyroxine  50 mcg Oral QAC breakfast  . losartan  50 mg Oral Daily  . metoprolol succinate  50 mg Oral Daily  . mirtazapine  30 mg Oral QHS  . montelukast  10 mg Oral QHS  . multivitamin with minerals  1 tablet Oral Daily  . mupirocin ointment  1 application Nasal BID  . polyethylene glycol  17 g Oral Daily  . pravastatin  40 mg Oral q1800  . predniSONE  5 mg Oral Q breakfast  . senna-docusate  1 tablet Oral Daily  . sodium chloride  3 mL Intravenous Q12H  . [START ON 11/14/2014] Vitamin D (Ergocalciferol)  50,000 Units Oral Q Fri   Continuous Infusions:     Time spent: 35 minutes    Velvet Bathe  [facility] Pager [phone] If 7PM-7AM, please contact night-coverage at [email or web address], password [facility] 11/13/2014, 6:10 PM  LOS: 2 days

## 2014-11-13 NOTE — Clinical Documentation Improvement (Signed)
11/13/14  Dear Dr.Vega Rolley Sims  Presents with Epistaxis and Anemia. GI to be consulted.   H&H's = 7.8 / 24 , 7.4 / 22.2  Has received 2 unit's PRBC's  Please clarify the specificty of the patient's anemia and document findings in next progress note and include in discharge summary if applicable.   Acute Blood Loss Anemia  Acute on chronic blood loss anemia  Chronic blood loss anemia  Other Type of Anemia  Other Condition________________  Cannot Clinically Determine  Thank You, Zoila Shutter ,RN Clinical Documentation Specialist:  [phone]  [place] Information Management

## 2014-11-13 NOTE — Progress Notes (Signed)
Physical Therapy Treatment Patient Details Name: [name] MRN: [id number] DOB: [birth year] Today's Date: Nov 15, 2014    History of Present Illness 81 yo female with hx of anemia, diverticulosis, angiodysplasia of stomach, colon, vitamin b12 def apparently c/o feeling weak. Admitted with anemia and COPD exacerbation.    PT Comments    Pt making good progress.   Follow Up Recommendations  Home health PT (Return to ALF)     Equipment Recommendations  None recommended by PT    Recommendations for Other Services       Precautions / Restrictions Precautions Precautions: None Precaution Comments: monitor O2 Restrictions Weight Bearing Restrictions: No    Mobility  Bed Mobility Overal bed mobility: Modified Independent                Transfers Overall transfer level: Modified independent Equipment used: Rolling walker (2 wheeled) Transfers: Sit to/from Stand Sit to Stand: Supervision;Modified independent (Device/Increase time)            Ambulation/Gait Ambulation/Gait assistance: Supervision Ambulation Distance (Feet): 250 Feet Assistive device: Rolling walker (2 wheeled) Gait Pattern/deviations: Decreased step length - right;Decreased step length - left Gait velocity: decreased   General Gait Details: No rest break required.    Stairs            Wheelchair Mobility    Modified Rankin (Stroke Patients Only)       Balance     Sitting balance-Leahy Scale: Good       Standing balance-Leahy Scale: Fair                      Cognition Arousal/Alertness: Awake/alert Behavior During Therapy: WFL for tasks assessed/performed Overall Cognitive Status: Within Functional Limits for tasks assessed                      Exercises General Exercises - Lower Extremity Quad Sets: AROM;5 reps;Supine;Both Hip ABduction/ADduction: AROM;5 reps;Supine;Both Hip Flexion/Marching: Strengthening;Both;10 reps;Seated    General Comments         Pertinent Vitals/Pain Pain Assessment: No/denies pain    Home Living                      Prior Function            PT Goals (current goals can now be found in the care plan section) Acute Rehab PT Goals Patient Stated Goal: Go back home to ALF PT Goal Formulation: With patient Time For Goal Achievement: 11/26/14 Potential to Achieve Goals: Good Progress towards PT goals: Progressing toward goals    Frequency  Min 3X/week    PT Plan Current plan remains appropriate    Co-evaluation             End of Session   Activity Tolerance: Patient tolerated treatment well Patient left: in bed;with call bell/phone within reach     Time: 1436-1455 PT Time Calculation (min) (ACUTE ONLY): 19 min  Charges:  $Gait Training: 8-22 mins                    G Codes:      Jemeka Wagler 2014/11/15, 3:47 PM  [facility] PT [phone]

## 2014-11-14 DIAGNOSIS — Z8719 Personal history of other diseases of the digestive system: Secondary | ICD-10-CM

## 2014-11-14 DIAGNOSIS — R935 Abnormal findings on diagnostic imaging of other abdominal regions, including retroperitoneum: Secondary | ICD-10-CM

## 2014-11-14 LAB — PROTIME-INR
INR: 1.19 (ref 0.00–1.49)
PROTHROMBIN TIME: 15.3 s — AB (ref 11.6–15.2)

## 2014-11-14 LAB — BASIC METABOLIC PANEL
Anion gap: 9 (ref 5–15)
BUN: 27 mg/dL — AB (ref 6–20)
CO2: 23 mmol/L (ref 22–32)
CREATININE: 1.17 mg/dL — AB (ref 0.44–1.00)
Calcium: 9.1 mg/dL (ref 8.9–10.3)
Chloride: 108 mmol/L (ref 101–111)
GFR, EST AFRICAN AMERICAN: 50 mL/min — AB (ref >60)
GFR, EST NON AFRICAN AMERICAN: 43 mL/min — AB (ref >60)
Glucose, Bld: 93 mg/dL (ref 65–99)
Potassium: 4.2 mmol/L (ref 3.5–5.1)
Sodium: 140 mmol/L (ref 135–145)

## 2014-11-14 LAB — CBC
HCT: 29.2 % — ABNORMAL LOW (ref 36.0–46.0)
HEMOGLOBIN: 9.8 g/dL — AB (ref 12.0–15.0)
MCH: 32.6 pg (ref 26.0–34.0)
MCHC: 33.6 g/dL (ref 30.0–36.0)
MCV: 97 fL (ref 78.0–100.0)
Platelets: 145 10*3/uL — ABNORMAL LOW (ref 150–400)
RBC: 3.01 MIL/uL — AB (ref 3.87–5.11)
RDW: 18.6 % — AB (ref 11.5–15.5)
WBC: 7.8 10*3/uL (ref 4.0–10.5)

## 2014-11-14 LAB — OCCULT BLOOD X 1 CARD TO LAB, STOOL: Fecal Occult Bld: NEGATIVE

## 2014-11-14 LAB — APTT: aPTT: 30 seconds (ref 24–37)

## 2014-11-14 NOTE — Care Management Note (Signed)
Case Management Note  Patient Details  Name: [name] MRN: [id number] Date of Birth: December 24, 1933  Subjective/Objective:   Patient for dc to SNF today, CSW aware.                  Action/Plan:   Expected Discharge Date:                  Expected Discharge Plan:  [place]  In-House Referral:  Clinical Social Work  Discharge planning Services  CM Consult  Post Acute Care Choice:    Choice offered to:     DME Arranged:    DME Agency:     HH Arranged:    [place] Agency:     Status of Service:  Completed, signed off  Medicare Important Message Given:  Yes Date Medicare IM Given:  11/14/14 Medicare IM give by:  Tomi Bamberger RN Date Additional Medicare IM Given:    Additional Medicare Important Message give by:     If discussed at [place] of Stay Meetings, dates discussed:    Additional Comments:  Zenon Mayo, RN 11/14/2014, 11:26 AM

## 2014-11-14 NOTE — Progress Notes (Signed)
[facility] PROGRESS NOTE  Jasper Ruminski [place] [id number]:[id number] DOB: 02-01-1934 DOA: 11/11/2014 PCP: [name], MD  Assessment/Plan: Active Problems:   Anemia - Not clearly explained - We'll obtain routine workup - Consult hematology next a.m. - Gastroenterologist on board    Epistaxis - Discontinue nasal packing. Should patient believe he can would consult ENT specialist for further evaluation.    COPD exacerbation - Improving on current regimen. Patient reports improvement in condition.  Code Status: Full Family Communication: No family at bedside  Disposition Plan: Pending improvement in condition   Consultants:  Gastroenterologist  Procedures:  None  Antibiotics:  None  HPI/Subjective: Patient has no new complaints. Nasal packing removed  Objective: Filed Vitals:   11/14/14 1421  BP: 157/65  Pulse: 97  Temp: 97.5 F (36.4 C)  Resp: 19    Intake/Output Summary (Last 24 hours) at 11/14/14 1812 Last data filed at 11/14/14 1731  Gross per 24 hour  Intake    180 ml  Output   1950 ml  Net  -1770 ml   Filed Weights   11/11/14 2323 11/13/14 0451 11/14/14 0421  Weight: 79.8 kg (175 lb 14.8 oz) 80 kg (176 lb 5.9 oz) 79.6 kg (175 lb 7.8 oz)    Exam:   General:  Patient in no acute distress, alert and awake  Cardiovascular: Regular rate and rhythm, no murmurs or rubs  Respiratory: Clear to auscultation bilaterally, no wheezes  Abdomen: Soft, nondistended  Musculoskeletal: Equal tone   Data Reviewed: Basic Metabolic Panel:  Recent Labs Lab 11/11/14 1944 11/12/14 0720 11/14/14 0629  NA 139 139 140  K 4.4 4.3 4.2  CL 105 107 108  CO2 25 21* 23  GLUCOSE 119* 127* 93  BUN 30* 25* 27*  CREATININE 1.15* 1.14* 1.17*  CALCIUM 9.7 9.2 9.1   Liver Function Tests:  Recent Labs Lab 11/12/14 0720  AST 26  ALT 24  ALKPHOS 39  BILITOT 0.5  PROT 5.8*  ALBUMIN 3.7   No results for input(s): LIPASE, AMYLASE in the last 168 hours. No  results for input(s): AMMONIA in the last 168 hours. CBC:  Recent Labs Lab 11/11/14 1944 11/12/14 0720 11/13/14 0505 11/14/14 0629  WBC 6.3 7.7  --  7.8  NEUTROABS 4.3  --   --   --   HGB 7.8* 7.4* 9.0* 9.8*  HCT 24.2* 22.2* 26.5* 29.2*  MCV 103.4* 100.5*  --  97.0  PLT 149* 149*  --  145*   Cardiac Enzymes: No results for input(s): CKTOTAL, CKMB, CKMBINDEX, TROPONINI in the last 168 hours. BNP (last 3 results) No results for input(s): BNP in the last 8760 hours.  ProBNP (last 3 results) No results for input(s): PROBNP in the last 8760 hours.  CBG: No results for input(s): GLUCAP in the last 168 hours.  Recent Results (from the past 240 hour(s))  MRSA PCR Screening     Status: Abnormal   Collection Time: 11/12/14  2:35 AM  Result Value Ref Range Status   MRSA by PCR POSITIVE (A) NEGATIVE Final    Comment:        The GeneXpert MRSA Assay (FDA approved for NASAL specimens only), is one component of a comprehensive MRSA colonization surveillance program. It is not intended to diagnose MRSA infection nor to guide or monitor treatment for MRSA infections. RESULT CALLED TO, READ BACK BY AND VERIFIED WITH: K.[place] RN [phone] 11/12/14 E.GADDY      Studies: Ct Abdomen Pelvis Wo Contrast  11/13/2014  CLINICAL DATA:  Anemia and easy bruising. Rule out retroperitoneal bleed.  EXAM: CT ABDOMEN AND PELVIS WITHOUT CONTRAST  TECHNIQUE: Multidetector CT imaging of the abdomen and pelvis was performed following the standard protocol without IV contrast.  COMPARISON:  12/04/2011  FINDINGS: BODY WALL: Parastomal hernia in the left lower quadrant containing nonobstructed bowel.  LOWER CHEST: Dependent atelectasis and scarring. Dual-chamber pacer leads. No acute findings.  ABDOMEN/PELVIS:  Liver: Scattered low-density foci in the liver compatible with cysts. Prominent hepatic fissures and caudate lobe with subtle surface nodularity.  Biliary: No evidence of biliary obstruction or stone.   Pancreas: Unremarkable.  Spleen: Granulomatous changes.  Adrenals: Unremarkable.  Kidneys and ureters: No hydronephrosis or stone. Stable exophytic cyst from the upper pole left kidney measuring 28 mm. 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  Bladder: Unremarkable.  Reproductive: Hysterectomy and probable oophorectomies.  Bowel: No obstruction. Descending colostomy with Hartman's pouch. Peristomal hernia containing nonobstructed small bowel.  Retroperitoneum: No mass or adenopathy.  Peritoneum: No ascites or pneumoperitoneum.  Vascular: No acute abnormality.  OSSEOUS: Left sacral ala and bilateral obturator ring insufficiency fractures which are remote and healed. There is advanced lumbar degenerative disc disease with multilevel translation and scoliosis. Grade 1 anterolisthesis at L5-S1.  IMPRESSION: 1. No retroperitoneal or other acute hemorrhage. 2. Descending colostomy with peristomal hernia containing nonobstructed bowel. 3. Possible cirrhosis.  Correlate for risk factors.   Electronically Signed   By: Monte Fantasia M.D.   On: 11/13/2014 21:37    Scheduled Meds: . sodium chloride   Intravenous Once  . albuterol  3 mL Inhalation BID  . budesonide  2 mL Inhalation BID  . Chlorhexidine Gluconate Cloth  6 each Topical Q0600  . DULoxetine  30 mg Oral Daily  . famotidine  20 mg Oral BID  . levothyroxine  50 mcg Oral QAC breakfast  . losartan  50 mg Oral Daily  . metoprolol succinate  50 mg Oral Daily  . mirtazapine  30 mg Oral QHS  . montelukast  10 mg Oral QHS  . multivitamin with minerals  1 tablet Oral Daily  . mupirocin ointment  1 application Nasal BID  . polyethylene glycol  17 g Oral Daily  . pravastatin  40 mg Oral q1800  . predniSONE  5 mg Oral Q breakfast  . senna-docusate  1 tablet Oral Daily  . sodium chloride  3 mL Intravenous Q12H  . Vitamin D (Ergocalciferol)  50,000 Units Oral Q Fri   Continuous Infusions:     Time spent: 35 minutes    Velvet Bathe  [facility] Pager [phone] If 7PM-7AM, please contact night-coverage at [email or web address], password [facility] 11/14/2014, 6:12 PM  LOS: 3 days

## 2014-11-14 NOTE — Clinical Social Work Note (Signed)
Clinical Social Work Assessment  Patient Details  Name: [name] MRN: [id number] Date of Birth: September 17, 1933  Date of referral:  11/13/14               Reason for consult:  Discharge Planning, Facility Placement                Permission sought to share information with:  Facility Sport and exercise psychologist, Family Supports Permission granted to share information::  Yes, Verbal Permission Granted  Name::     [name] Section::  [facility]  Relationship::     Contact Information:     Housing/Transportation Living arrangements for the past 2 months:  [place] of Information:  Patient, Adult Children Patient Interpreter Needed:  None Criminal Activity/Legal Involvement Pertinent to Current Situation/Hospitalization:  No - Comment as needed Significant Relationships:  Adult Children Lives with:  Facility Resident Do you feel safe going back to the place where you live?  Yes Need for family participation in patient care:  No (Coment)  Care giving concerns:  Patient and daughter have no concerns about patient returning to [facility].   Social Worker assessment / plan:  CSW met with patient and daughter [name] at bedside to complete assessment. The patient is a very pleasant 81 year old female admitted from [facility]. The patient states that she is happy with the care she receives at [facility] and plans to return to the facility at discharge. CSW explained role and process of getting her back to the facility.  Employment status:  Retired Forensic scientist:  Medicare PT Recommendations:  Home with [place] / Referral to community resources:     Patient/Family's Response to care:  The patient and daughter report they are happy with the care she has received in the hospital.  Patient/Family's Understanding of and Emotional Response to Diagnosis, Current Treatment, and Prognosis:  The patient has great insight into why she was admitted  and what her post discharge needs will be. Daughter understands as well but would like to speak with the doctor.  Emotional Assessment Appearance:  Appears stated age Attitude/Demeanor/Rapport:  Other (Appropriate) Affect (typically observed):  Accepting, Appropriate, Happy, Stable, Calm Orientation:  Oriented to Self, Oriented to Place, Oriented to  Time, Oriented to Situation Alcohol / Substance use:  Tobacco Use, Alcohol Use (Hx of tobacco use.) Psych involvement (Current and /or in the community):  No (Comment)  Discharge Needs  Concerns to be addressed:  Discharge Planning Concerns Readmission within the last 30 days:  No Current discharge risk:  Physical Impairment Barriers to Discharge:  Continued Medical Work up   [place], LCSW 11/14/2014, 8:22 AM

## 2014-11-14 NOTE — Progress Notes (Signed)
Daily Rounding Note  11/14/2014, 8:22 AM  LOS: 3 days   SUBJECTIVE:    Hard stools per ostomy.  Breathing better but still with paroxysmal cough.  Less SOB with walking yesterday, has not yet walked with PT today.      Tolerating solids, like her breakfast better today than previous meals.  Pt concerned that she needs a slower taper of steroids.     OBJECTIVE:         Vital signs in last 24 hours:    Temp:  [98.2 F (36.8 C)-98.7 F (37.1 C)] 98.7 F (37.1 C) (05/27 0640) Pulse Rate:  [64-83] 83 (05/27 0640) Resp:  [18] 18 (05/27 0640) BP: (111-137)/(67-79) 137/67 mmHg (05/27 0640) SpO2:  [95 %-99 %] 95 % (05/27 0814) Weight:  [175 lb 7.8 oz (79.6 kg)] 175 lb 7.8 oz (79.6 kg) (05/27 0421) Last BM Date: 11/13/14 Filed Weights   11/11/14 2323 11/13/14 0451 11/14/14 0421  Weight: 175 lb 14.8 oz (79.8 kg) 176 lb 5.9 oz (80 kg) 175 lb 7.8 oz (79.6 kg)   General: still ill looking but not in as much distress   Heart: RRR Chest: barking cough.  Some upper airway noise Abdomen: soft, NT. Peristomal/incisional hernia.    Extremities: no CCE.  Purpura on arms.  Neuro/Psych:  Slightly anxious.  Alert, oriented x 3.   Intake/Output from previous day: 05/26 0701 - 05/27 0700 In: 780 [P.O.:780] Out: 1800 [Urine:1800]  Intake/Output this shift:    Lab Results:  Recent Labs  11/11/14 1944 11/12/14 0720 11/13/14 0505 11/14/14 0629  WBC 6.3 7.7  --  7.8  HGB 7.8* 7.4* 9.0* 9.8*  HCT 24.2* 22.2* 26.5* 29.2*  PLT 149* 149*  --  145*   BMET  Recent Labs  11/11/14 1944 11/12/14 0720  NA 139 139  K 4.4 4.3  CL 105 107  CO2 25 21*  GLUCOSE 119* 127*  BUN 30* 25*  CREATININE 1.15* 1.14*  CALCIUM 9.7 9.2   LFT  Recent Labs  11/12/14 0720  PROT 5.8*  ALBUMIN 3.7  AST 26  ALT 24  ALKPHOS 39  BILITOT 0.5   PT/INR  Recent Labs  11/14/14 0629  LABPROT 15.3*  INR 1.19   Hepatitis Panel No  results for input(s): HEPBSAG, HCVAB, HEPAIGM, HEPBIGM in the last 72 hours.  Studies/Results: Ct Abdomen Pelvis Wo Contrast  11/13/2014   CLINICAL DATA:  Anemia and easy bruising. Rule out retroperitoneal bleed.  EXAM: CT ABDOMEN AND PELVIS WITHOUT CONTRAST  TECHNIQUE: Multidetector CT imaging of the abdomen and pelvis was performed following the standard protocol without IV contrast.  COMPARISON:  12/04/2011  FINDINGS: BODY WALL: Parastomal hernia in the left lower quadrant containing nonobstructed bowel.  LOWER CHEST: Dependent atelectasis and scarring. Dual-chamber pacer leads. No acute findings.  ABDOMEN/PELVIS:  Liver: Scattered low-density foci in the liver compatible with cysts. Prominent hepatic fissures and caudate lobe with subtle surface nodularity.  Biliary: No evidence of biliary obstruction or stone.  Pancreas: Unremarkable.  Spleen: Granulomatous changes.  Adrenals: Unremarkable.  Kidneys and ureters: No hydronephrosis or stone. Stable exophytic cyst from the upper pole left kidney measuring 28 mm. 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  Bladder: Unremarkable.  Reproductive: Hysterectomy and probable oophorectomies.  Bowel: No obstruction. Descending colostomy with Hartman's pouch. Peristomal hernia containing nonobstructed small bowel.  Retroperitoneum: No mass or adenopathy.  Peritoneum: No ascites or pneumoperitoneum.  Vascular: No acute abnormality.  OSSEOUS: Left sacral ala and bilateral obturator ring insufficiency fractures which are remote and healed. There is advanced lumbar degenerative disc disease with multilevel translation and scoliosis. Grade 1 anterolisthesis at L5-S1.  IMPRESSION: 1. No retroperitoneal or other acute hemorrhage. 2. Descending colostomy with peristomal hernia containing nonobstructed bowel. 3. Possible cirrhosis.  Correlate for risk factors.   Electronically Signed   By: Monte Fantasia M.D.   On: 11/13/2014 21:37   Scheduled Meds: . sodium chloride    Intravenous Once  . albuterol  3 mL Inhalation BID  . budesonide  2 mL Inhalation BID  . Chlorhexidine Gluconate Cloth  6 each Topical Q0600  . DULoxetine  30 mg Oral Daily  . famotidine  20 mg Oral BID  . levothyroxine  50 mcg Oral QAC breakfast  . losartan  50 mg Oral Daily  . metoprolol succinate  50 mg Oral Daily  . mirtazapine  30 mg Oral QHS  . montelukast  10 mg Oral QHS  . multivitamin with minerals  1 tablet Oral Daily  . mupirocin ointment  1 application Nasal BID  . polyethylene glycol  17 g Oral Daily  . pravastatin  40 mg Oral q1800  . predniSONE  5 mg Oral Q breakfast  . senna-docusate  1 tablet Oral Daily  . sodium chloride  3 mL Intravenous Q12H  . Vitamin D (Ergocalciferol)  50,000 Units Oral Q Fri   Continuous Infusions:  PRN Meds:.albuterol, ALPRAZolam, baclofen, diphenhydrAMINE, oxyCODONE  ASSESMENT:   * Anemia, macrocytic. Hgb dropped up to 4 grams in last 2 months. B12, folate levels normal.  Previously iron deficient but currently iron levels ok.  S/p 2 PRBCs.  CT negative for RP bleed.   * FOBT +. Minor peristomal bleeding when she removes ostomy adhesive but no bloody stools or melena Hx AVMs on EGD and colonoscopy in 2010. These may be a source of slow, chronic blood loss  * Epistaxis. Single episode for several hours on day PTA. S/p rhino rocket to right nares. Also likely to cause FOBT + stool.   *  Subtle liver nodularity/? Cirrhosis per 5/26 CT.   *  Non critical Thrombocytopenia, trend since 08/2014.  * COPD with flare. Baseline nocturnal oxygen. Hypoxic with walking halls. Baseline 5 mg prednisone, was on high dose prednisone but now back on baseline dose. Pt concerned that she needs a slower taper of steroids  *   Stage 3 CKD.    PLAN   *  Has tentative slot for EGD/colonoscopy set for 5/31, however pt must be stable and in good respiratory health for this to proceed.  Will check on her over the holiday weekend to see if she  looks fit for this.   *  Steroids mgt per hospitalist.  She had seen Dr Melvyn Novas, pulmonologist in past, no OVs since 10/2012.      Azucena Freed  11/14/2014, 8:22 AM Pager: [phone]

## 2014-11-15 LAB — CBC WITH DIFFERENTIAL/PLATELET
BASOS ABS: 0 10*3/uL (ref 0.0–0.1)
BASOS PCT: 0 % (ref 0–1)
Eosinophils Absolute: 0.2 10*3/uL (ref 0.0–0.7)
Eosinophils Relative: 2 % (ref 0–5)
HCT: 25.7 % — ABNORMAL LOW (ref 36.0–46.0)
Hemoglobin: 8.7 g/dL — ABNORMAL LOW (ref 12.0–15.0)
Lymphocytes Relative: 35 % (ref 12–46)
Lymphs Abs: 2.3 10*3/uL (ref 0.7–4.0)
MCH: 32.2 pg (ref 26.0–34.0)
MCHC: 33.9 g/dL (ref 30.0–36.0)
MCV: 95.2 fL (ref 78.0–100.0)
MONOS PCT: 11 % (ref 3–12)
Monocytes Absolute: 0.7 10*3/uL (ref 0.1–1.0)
NEUTROS PCT: 52 % (ref 43–77)
Neutro Abs: 3.4 10*3/uL (ref 1.7–7.7)
Platelets: 133 10*3/uL — ABNORMAL LOW (ref 150–400)
RBC: 2.7 MIL/uL — AB (ref 3.87–5.11)
RDW: 17.8 % — ABNORMAL HIGH (ref 11.5–15.5)
WBC: 6.6 10*3/uL (ref 4.0–10.5)

## 2014-11-15 LAB — RETICULOCYTES
RBC.: 2.7 MIL/uL — ABNORMAL LOW (ref 3.87–5.11)
Retic Count, Absolute: 83.7 10*3/uL (ref 19.0–186.0)
Retic Ct Pct: 3.1 % (ref 0.4–3.1)

## 2014-11-15 LAB — DIRECT ANTIGLOBULIN TEST (NOT AT ARMC)
DAT, IGG: NEGATIVE
DAT, complement: POSITIVE

## 2014-11-15 LAB — VITAMIN B12: VITAMIN B 12: 775 pg/mL (ref 180–914)

## 2014-11-15 LAB — LACTATE DEHYDROGENASE: LDH: 481 U/L — AB (ref 98–192)

## 2014-11-15 NOTE — Progress Notes (Signed)
Progress Note   Subjective  Patient reports she feels overall weaker today, asked nurse to put on oxygen. Normally only wears this at night Out of bed to chair this morning Ate breakfast Nasal tamponade device removed late yesterday, had scant bleeding from right nare but this has resolved No melena   Objective   Vital signs in last 24 hours: Temp:  [97.5 F (36.4 C)-99.2 F (37.3 C)] 98.1 F (36.7 C) (05/28 0602) Pulse Rate:  [80-97] 80 (05/27 2148) Resp:  [17-19] 17 (05/28 0602) BP: (133-157)/(54-65) 142/54 mmHg (05/28 0602) SpO2:  [93 %-99 %] 99 % (05/28 0602) Weight:  [172 lb 6.4 oz (78.2 kg)] 172 lb 6.4 oz (78.2 kg) (05/28 0602) Last BM Date: 11/14/14 Gen: awake, alert, NAD, chronically ill-appearing HEENT: anicteric, op clear, oxygen by nasal cannula CV: RRR Pulm: Diffuse bilateral rhonchi without wheezing or rails Abd: soft, large hernia, ostomy LLQ with formed stool, +BS, nontender Ext: no c/c, trace pretib edema Neuro: nonfocal   Intake/Output from previous day: 05/27 0701 - 05/28 0700 In: 240 [P.O.:240] Out: 1050 [Urine:1050] Intake/Output this shift: Total I/O In: 660 [P.O.:660] Out: 500 [Urine:500]  Lab Results:  Recent Labs  11/13/14 0505 11/14/14 0629 11/15/14 0435  WBC  --  7.8 6.6  HGB 9.0* 9.8* 8.7*  HCT 26.5* 29.2* 25.7*  PLT  --  145* 133*   BMET  Recent Labs  11/14/14 0629  NA 140  K 4.2  CL 108  CO2 23  GLUCOSE 93  BUN 27*  CREATININE 1.17*  CALCIUM 9.1   PT/INR  Recent Labs  11/14/14 0629  LABPROT 15.3*  INR 1.19   LDH elevated Haptoglobin pending    Studies/Results: Ct Abdomen Pelvis Wo Contrast  11/13/2014   CLINICAL DATA:  Anemia and easy bruising. Rule out retroperitoneal bleed.  EXAM: CT ABDOMEN AND PELVIS WITHOUT CONTRAST  TECHNIQUE: Multidetector CT imaging of the abdomen and pelvis was performed following the standard protocol without IV contrast.  COMPARISON:  12/04/2011  FINDINGS: BODY WALL:  Parastomal hernia in the left lower quadrant containing nonobstructed bowel.  LOWER CHEST: Dependent atelectasis and scarring. Dual-chamber pacer leads. No acute findings.  ABDOMEN/PELVIS:  Liver: Scattered low-density foci in the liver compatible with cysts. Prominent hepatic fissures and caudate lobe with subtle surface nodularity.  Biliary: No evidence of biliary obstruction or stone.  Pancreas: Unremarkable.  Spleen: Granulomatous changes.  Adrenals: Unremarkable.  Kidneys and ureters: No hydronephrosis or stone. Stable exophytic cyst from the upper pole left kidney measuring 28 mm. 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  Bladder: Unremarkable.  Reproductive: Hysterectomy and probable oophorectomies.  Bowel: No obstruction. Descending colostomy with Hartman's pouch. Peristomal hernia containing nonobstructed small bowel.  Retroperitoneum: No mass or adenopathy.  Peritoneum: No ascites or pneumoperitoneum.  Vascular: No acute abnormality.  OSSEOUS: Left sacral ala and bilateral obturator ring insufficiency fractures which are remote and healed. There is advanced lumbar degenerative disc disease with multilevel translation and scoliosis. Grade 1 anterolisthesis at L5-S1.  IMPRESSION: 1. No retroperitoneal or other acute hemorrhage. 2. Descending colostomy with peristomal hernia containing nonobstructed bowel. 3. Possible cirrhosis.  Correlate for risk factors.   Electronically Signed   By: Monte Fantasia M.D.   On: 11/13/2014 21:37     Assessment / Plan:   81 year old with severe COPD, recent epistaxis, history of iron deficiency anemia, breast cancer in remission, chronic kidney disease admitted with new macrocytic anemia, epistaxis, and COPD flare  1. Anemia -- confusing  picture.  Agree with hematology input.  In 2014 was microcytic with low iron, now macrocytic.  LDH is elevated.  She is having easy bruising and epistaxis raising the question of coagulation disorder or platelet dysfunction?? Rule  out hemolytic anemia, though bilirubin not elevated --Has history of angiodysplastic lesions of the gut, we have considered and discussed endoscopy and colonoscopy at length on multiple occasions. COPD is barrier for sedation and makes the procedures higher risk. Stool recently heme-negative. May eventually need EGD and colonoscopy, if so would need to be with monitored anesthesia care --Await hematology input --Monitor hemoglobin, down slightly today  2. Epistaxis -- nasal tamponade device removed, monitor for recurrent bleeding. If this occurs consult ENT  3. COPD -- chronic steroid-induced, improved from admission though very rhonchorous today. This is felt to be her near her baseline  4. Possible cirrhosis by CT -- cryptogenic, rule out viral hepatitis.  Fatty liver possible though she denies history of fatty liver or elevated liver enzymes. Platelets are slightly low, query mild portal hypertension. Overall synthetic function appears good at this time with normal albumin  Depending on hematology input, workup to continue as outpatient  Active Problems:   Anemia   Epistaxis   COPD exacerbation     LOS: 4 days   Yifan Auker M  11/15/2014, 11:56 AM

## 2014-11-15 NOTE — Progress Notes (Signed)
[facility] PROGRESS NOTE  [name] VOH:[id number] DOB: 1933/11/16 DOA: 11/11/2014 PCP: [name], MD  Assessment/Plan: Active Problems:   Anemia - Not clearly explained - Obtained routine workup - Consulted hematology  - Gastroenterologist on board    Epistaxis - Discontinue nasal packing. Should patient believe he can would consult ENT specialist for further evaluation.    COPD exacerbation - Improving on current regimen. Patient reports improvement in condition.  Code Status: Full Family Communication: No family at bedside  Disposition Plan: Pending improvement in condition   Consultants:  Gastroenterologist  Procedures:  None  Antibiotics:  None  HPI/Subjective: Patient has no new complaints. Nasal packing removed and no more reports of extra bleeding.  Objective: Filed Vitals:   11/15/14 1451  BP: 130/55  Pulse:   Temp: 98.5 F (36.9 C)  Resp: 18    Intake/Output Summary (Last 24 hours) at 11/15/14 1641 Last data filed at 11/15/14 1006  Gross per 24 hour  Intake    780 ml  Output   1550 ml  Net   -770 ml   Filed Weights   11/13/14 0451 11/14/14 0421 11/15/14 0602  Weight: 80 kg (176 lb 5.9 oz) 79.6 kg (175 lb 7.8 oz) 78.2 kg (172 lb 6.4 oz)    Exam:   General:  Patient in no acute distress, alert and awake  Cardiovascular: Regular rate and rhythm, no murmurs or rubs  Respiratory: Clear to auscultation bilaterally, no wheezes  Abdomen: Soft, nondistended  Musculoskeletal: Equal tone   Data Reviewed: Basic Metabolic Panel:  Recent Labs Lab 11/11/14 1944 11/12/14 0720 11/14/14 0629  NA 139 139 140  K 4.4 4.3 4.2  CL 105 107 108  CO2 25 21* 23  GLUCOSE 119* 127* 93  BUN 30* 25* 27*  CREATININE 1.15* 1.14* 1.17*  CALCIUM 9.7 9.2 9.1   Liver Function Tests:  Recent Labs Lab 11/12/14 0720  AST 26  ALT 24  ALKPHOS 39  BILITOT 0.5  PROT 5.8*  ALBUMIN 3.7   No results for input(s): LIPASE, AMYLASE in the  last 168 hours. No results for input(s): AMMONIA in the last 168 hours. CBC:  Recent Labs Lab 11/11/14 1944 11/12/14 0720 11/13/14 0505 11/14/14 0629 11/15/14 0435  WBC 6.3 7.7  --  7.8 6.6  NEUTROABS 4.3  --   --   --  3.4  HGB 7.8* 7.4* 9.0* 9.8* 8.7*  HCT 24.2* 22.2* 26.5* 29.2* 25.7*  MCV 103.4* 100.5*  --  97.0 95.2  PLT 149* 149*  --  145* 133*   Cardiac Enzymes: No results for input(s): CKTOTAL, CKMB, CKMBINDEX, TROPONINI in the last 168 hours. BNP (last 3 results) No results for input(s): BNP in the last 8760 hours.  ProBNP (last 3 results) No results for input(s): PROBNP in the last 8760 hours.  CBG: No results for input(s): GLUCAP in the last 168 hours.  Recent Results (from the past 240 hour(s))  MRSA PCR Screening     Status: Abnormal   Collection Time: 11/12/14  2:35 AM  Result Value Ref Range Status   MRSA by PCR POSITIVE (A) NEGATIVE Final    Comment:        The GeneXpert MRSA Assay (FDA approved for NASAL specimens only), is one component of a comprehensive MRSA colonization surveillance program. It is not intended to diagnose MRSA infection nor to guide or monitor treatment for MRSA infections. RESULT CALLED TO, READ BACK BY AND VERIFIED WITH: K.[place] RN [phone] 11/12/14 E.GADDY  Studies: Ct Abdomen Pelvis Wo Contrast  11/13/2014   CLINICAL DATA:  Anemia and easy bruising. Rule out retroperitoneal bleed.  EXAM: CT ABDOMEN AND PELVIS WITHOUT CONTRAST  TECHNIQUE: Multidetector CT imaging of the abdomen and pelvis was performed following the standard protocol without IV contrast.  COMPARISON:  12/04/2011  FINDINGS: BODY WALL: Parastomal hernia in the left lower quadrant containing nonobstructed bowel.  LOWER CHEST: Dependent atelectasis and scarring. Dual-chamber pacer leads. No acute findings.  ABDOMEN/PELVIS:  Liver: Scattered low-density foci in the liver compatible with cysts. Prominent hepatic fissures and caudate lobe with subtle surface  nodularity.  Biliary: No evidence of biliary obstruction or stone.  Pancreas: Unremarkable.  Spleen: Granulomatous changes.  Adrenals: Unremarkable.  Kidneys and ureters: No hydronephrosis or stone. Stable exophytic cyst from the upper pole left kidney measuring 28 mm. 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  Bladder: Unremarkable.  Reproductive: Hysterectomy and probable oophorectomies.  Bowel: No obstruction. Descending colostomy with Hartman's pouch. Peristomal hernia containing nonobstructed small bowel.  Retroperitoneum: No mass or adenopathy.  Peritoneum: No ascites or pneumoperitoneum.  Vascular: No acute abnormality.  OSSEOUS: Left sacral ala and bilateral obturator ring insufficiency fractures which are remote and healed. There is advanced lumbar degenerative disc disease with multilevel translation and scoliosis. Grade 1 anterolisthesis at L5-S1.  IMPRESSION: 1. No retroperitoneal or other acute hemorrhage. 2. Descending colostomy with peristomal hernia containing nonobstructed bowel. 3. Possible cirrhosis.  Correlate for risk factors.   Electronically Signed   By: Monte Fantasia M.D.   On: 11/13/2014 21:37    Scheduled Meds: . sodium chloride   Intravenous Once  . albuterol  3 mL Inhalation BID  . budesonide  2 mL Inhalation BID  . Chlorhexidine Gluconate Cloth  6 each Topical Q0600  . DULoxetine  30 mg Oral Daily  . famotidine  20 mg Oral BID  . levothyroxine  50 mcg Oral QAC breakfast  . losartan  50 mg Oral Daily  . metoprolol succinate  50 mg Oral Daily  . mirtazapine  30 mg Oral QHS  . montelukast  10 mg Oral QHS  . multivitamin with minerals  1 tablet Oral Daily  . mupirocin ointment  1 application Nasal BID  . polyethylene glycol  17 g Oral Daily  . pravastatin  40 mg Oral q1800  . predniSONE  5 mg Oral Q breakfast  . senna-docusate  1 tablet Oral Daily  . sodium chloride  3 mL Intravenous Q12H  . Vitamin D (Ergocalciferol)  50,000 Units Oral Q Fri   Continuous  Infusions:     Time spent: 35 minutes    Velvet Bathe  [facility] Pager [phone] If 7PM-7AM, please contact night-coverage at [email or web address], password [facility] 11/15/2014, 4:41 PM  LOS: 4 days

## 2014-11-16 DIAGNOSIS — R238 Other skin changes: Secondary | ICD-10-CM

## 2014-11-16 DIAGNOSIS — R718 Other abnormality of red blood cells: Secondary | ICD-10-CM

## 2014-11-16 LAB — CBC
HEMATOCRIT: 27 % — AB (ref 36.0–46.0)
Hemoglobin: 8.9 g/dL — ABNORMAL LOW (ref 12.0–15.0)
MCH: 31.7 pg (ref 26.0–34.0)
MCHC: 33 g/dL (ref 30.0–36.0)
MCV: 96.1 fL (ref 78.0–100.0)
PLATELETS: 179 10*3/uL (ref 150–400)
RBC: 2.81 MIL/uL — ABNORMAL LOW (ref 3.87–5.11)
RDW: 17.1 % — ABNORMAL HIGH (ref 11.5–15.5)
WBC: 8.6 10*3/uL (ref 4.0–10.5)

## 2014-11-16 LAB — FERRITIN: Ferritin: 149 ng/mL (ref 11–307)

## 2014-11-16 LAB — HAPTOGLOBIN: HAPTOGLOBIN: 91 mg/dL (ref 34–200)

## 2014-11-16 LAB — IRON AND TIBC
Iron: 126 ug/dL (ref 28–170)
Saturation Ratios: 40 % — ABNORMAL HIGH (ref 10.4–31.8)
TIBC: 312 ug/dL (ref 250–450)
UIBC: 186 ug/dL

## 2014-11-16 MED ORDER — MUPIROCIN 2 % EX OINT: 1.0000 "application " | TOPICAL_OINTMENT | Freq: Two times a day (BID) | CUTANEOUS | Status: DC

## 2014-11-16 NOTE — Discharge Summary (Signed)
Physician Discharge Summary  [name] QJJ:[id number] DOB: 28-Sep-1933 DOA: 11/11/2014  PCP: [name], MD  Admit date: 11/11/2014 Discharge date: 11/16/2014  Time spent: 35 minutes  Recommendations for Outpatient Follow-up:  1. Repeat hemoglobin levels/CBC within the next week 2. Follow-up with blood test ordered and drawn prior to discharge  Discharge Diagnoses:  Please see list below   Discharge Condition: Stable  Diet recommendation: Heart healthy from original history of present illness:   Filed Weights   11/14/14 0421 11/15/14 0602 11/16/14 0530  Weight: 79.6 kg (175 lb 7.8 oz) 78.2 kg (172 lb 6.4 oz) 79.2 kg (174 lb 9.7 oz)    History of present illness:  From original history of present illness: 81 yo female with hx of anemia, diverticulosis, angiodysplasia of stomach, colon, vitamin b12 def apparently c/o feeling weak, Recently asked her pcp to draw her blood and hgb was 8.8. Today pt had epistaxis and was found to have hgb of 7.8 Pt was found in ED to have epistaxis with cough, and therefore treated for copd by ED  Hospital Course:  Anemia - Not clearly explained - Obtained routine workup - Consulted hematology who recommended the following: 1. Severe anemia: I believe the cause of her anemia is multifactorial. I reviewed the blood work as well as reviewed her blood smear. Her reticulocyte count is not elevated. Haptoglobin from this admission is pending. G-81 and folic acid are normal. I do not believe the anemia is related to acute bleeding. Stool Hemoccults are negative. She may have underlying small amount of GI bleeding but it is unlikely the only cause for anemia. Send for iron studies.  Clinical suspicion:  Anemia of chronic disease, factors include renal dysfunction, liver dysfunction although her liver function tests are normal. Versus myelodysplasia Given her general condition, a conservative approach is ideal. I agree with supporting her with  blood transfusions and close clinical follow-up.  2. Direct Coombs test IgG negative complement positive: Does not automatically mean autoimmune hemolytic anemia. It could be related to complement activation for many reasons. Her reticulocyte count is not elevated. I believe the LDH is elevated due to underlying liver dysfunction. Blood smear does not show evidence of hemolysis. Will send for cold agglutinins and quantitative immunoglobulins (sometimes this may cause positive complement pos coombs test)  3. Easy bruising: Most likely related to platelet dysfunction. Could be multifactorial. CT scan suggestive of cirrhosis. This could explain the mild thrombocytopenia. This is also the cause of her nose bleeds. Could be medication induced.  - Gastroenterologist on board and did not believe the main problem was secondary to GI source   Epistaxis - Discontinue nasal packing. Should patient continue to have any bleeding patient should be referred to ENT specialist for further evaluation.   COPD exacerbation - Patient back at baseline and can continue her home medication regimen on discharge  Procedures:  None  Consultations:  Oncologist: Gudena  Gastroenterologist: Pyrtle  Discharge Exam: Filed Vitals:   11/16/14 0530  BP: 119/58  Pulse:   Temp: 98.4 F (36.9 C)  Resp: 18    General: Pt in nad, alert and awake Cardiovascular: no cyanosis, pink extremities Respiratory: no increased wob, no wheezes  Discharge Instructions   Discharge Instructions    Call MD for:  extreme fatigue    Complete by:  As directed      Call MD for:  persistant dizziness or light-headedness    Complete by:  As directed      Call  MD for:  temperature >100.4    Complete by:  As directed      Diet - low sodium heart healthy    Complete by:  As directed      Discharge instructions    Complete by:  As directed   Please call your oncologist office in follow-up with him within the next  week  Commend having your hemoglobin levels checked within the next week     Increase activity slowly    Complete by:  As directed           Current Discharge Medication List    START taking these medications   Details  mupirocin ointment (BACTROBAN) 2 % Place 1 application into the nose 2 (two) times daily. Qty: 22 g, Refills: 0      CONTINUE these medications which have NOT CHANGED   Details  acetaminophen (TYLENOL) 325 MG tablet Take 650 mg by mouth every 6 (six) hours as needed for mild pain.     albuterol (PROVENTIL HFA;VENTOLIN HFA) 108 (90 BASE) MCG/ACT inhaler Inhale 2 puffs into the lungs 2 (two) times daily.    albuterol (PROVENTIL) (2.5 MG/3ML) 0.083% nebulizer solution Take 2.5 mg by nebulization every 4 (four) hours as needed for wheezing or shortness of breath.    ALPRAZolam (XANAX) 0.25 MG tablet Take 0.25 mg by mouth at bedtime.     beclomethasone (QVAR) 80 MCG/ACT inhaler Inhale 3 puffs into the lungs 2 (two) times daily.     Calcium Carbonate-Vitamin D 600-400 MG-UNIT per tablet Take 1 tablet by mouth daily.    DEXILANT 60 MG capsule Take 60 mg by mouth daily before breakfast. Refills: 3    DULoxetine (CYMBALTA) 30 MG capsule Take 30 mg by mouth daily.    guaiFENesin-dextromethorphan (ROBITUSSIN DM) 100-10 MG/5ML syrup Take 15 mLs by mouth 2 (two) times daily as needed for cough.    hydrocortisone cream 1 % Apply 1 application topically once as needed for itching.    ketoconazole (NIZORAL) 2 % cream Apply topically every morning. Apply to affected area (rash). Refills: 6    levothyroxine (SYNTHROID, LEVOTHROID) 50 MCG tablet Take 50 mcg by mouth daily. Refills: 3    losartan (COZAAR) 50 MG tablet Take 50 mg by mouth daily.    metoprolol succinate (TOPROL-XL) 50 MG 24 hr tablet TAKE 1 TABLET BY MOUTH EVERY DAY Qty: 90 tablet, Refills: 1    mirtazapine (REMERON) 15 MG tablet Take 15 mg by mouth daily. Refills: 3    Multiple Vitamin (MULTIVITAMIN  WITH MINERALS) TABS Take 1 tablet by mouth daily.    polyethylene glycol (MIRALAX / GLYCOLAX) packet Take 8.5 g by mouth daily.     Polyvinyl Alcohol (LIQUID TEARS OP) Apply 2 drops to eye 2 (two) times daily.     pravastatin (PRAVACHOL) 40 MG tablet Take 40 mg by mouth daily.    predniSONE (DELTASONE) 5 MG tablet Take 5 mg by mouth daily with breakfast.    Associated Diagnoses: Iron deficiency anemia, unspecified; Vitamin B12 deficiency (dietary) anemia    ranitidine (ZANTAC) 300 MG tablet Take 300 mg by mouth daily.    Associated Diagnoses: Iron deficiency anemia, unspecified    senna-docusate (SENOKOT-S) 8.6-50 MG per tablet Take 1 tablet by mouth at bedtime.     zafirlukast (ACCOLATE) 20 MG tablet Take 20 mg by mouth 2 (two) times daily.    baclofen (LIORESAL) 10 MG tablet Take 10 mg by mouth every 8 (eight) hours as needed for muscle  spasms.    Associated Diagnoses: Iron deficiency anemia, unspecified; Vitamin B12 deficiency (dietary) anemia    diphenhydrAMINE (BENADRYL) 25 mg capsule Take 25 mg by mouth daily as needed for itching.     EPINEPHrine (EPIPEN IJ) Inject 1 application as directed as needed.    Associated Diagnoses: Iron deficiency anemia, unspecified; Vitamin B12 deficiency (dietary) anemia    gabapentin (NEURONTIN) 100 MG capsule Take 100 mg by mouth daily.    oxyCODONE (OXY IR/ROXICODONE) 5 MG immediate release tablet Take 5-10 mg by mouth every 4 (four) hours as needed (for cough / pain).     traZODone (DESYREL) 50 MG tablet Take 50 mg by mouth as needed for sleep.    Associated Diagnoses: Malignant neoplasm of breast (female), unspecified site; Iron deficiency anemia secondary to blood loss (chronic)    Vitamin D, Ergocalciferol, (DRISDOL) 50000 UNITS CAPS Take 50,000 Units by mouth every 7 (seven) days. On Fridays    XOLAIR 150 MG injection Inject 150 mg into the skin every 28 (twenty-eight) days.       STOP taking these medications     Budesonide  (RHINOCORT AQUA NA)      budesonide (RHINOCORT AQUA) 32 MCG/ACT nasal spray      budesonide (RHINOCORT AQUA) 32 MCG/ACT nasal spray      furosemide (LASIX) 20 MG tablet      ESZOPICLONE 3 MG tablet        Allergies  Allergen Reactions  . Aspirin Shortness Of Breath    Kicks up your asthma  . Sulfonamide Derivatives Hives  . Ibuprofen Other (See Comments)    Doesn't take because of her aspirin allergy  . Iron Dextran Itching     itching after test dose  . Sulfa Antibiotics Hives  . Tape Other (See Comments)    Prefers "paper" tape (very thin skin).  . Metoclopramide Hcl Other (See Comments)    REACTION: chomping mouth      The results of significant diagnostics from this hospitalization (including imaging, microbiology, ancillary and laboratory) are listed below for reference.    Significant Diagnostic Studies: Ct Abdomen Pelvis Wo Contrast  11/13/2014   CLINICAL DATA:  Anemia and easy bruising. Rule out retroperitoneal bleed.  EXAM: CT ABDOMEN AND PELVIS WITHOUT CONTRAST  TECHNIQUE: Multidetector CT imaging of the abdomen and pelvis was performed following the standard protocol without IV contrast.  COMPARISON:  12/04/2011  FINDINGS: BODY WALL: Parastomal hernia in the left lower quadrant containing nonobstructed bowel.  LOWER CHEST: Dependent atelectasis and scarring. Dual-chamber pacer leads. No acute findings.  ABDOMEN/PELVIS:  Liver: Scattered low-density foci in the liver compatible with cysts. Prominent hepatic fissures and caudate lobe with subtle surface nodularity.  Biliary: No evidence of biliary obstruction or stone.  Pancreas: Unremarkable.  Spleen: Granulomatous changes.  Adrenals: Unremarkable.  Kidneys and ureters: No hydronephrosis or stone. Stable exophytic cyst from the upper pole left kidney measuring 28 mm. 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  Bladder: Unremarkable.  Reproductive: Hysterectomy and probable oophorectomies.  Bowel: No obstruction.  Descending colostomy with Hartman's pouch. Peristomal hernia containing nonobstructed small bowel.  Retroperitoneum: No mass or adenopathy.  Peritoneum: No ascites or pneumoperitoneum.  Vascular: No acute abnormality.  OSSEOUS: Left sacral ala and bilateral obturator ring insufficiency fractures which are remote and healed. There is advanced lumbar degenerative disc disease with multilevel translation and scoliosis. Grade 1 anterolisthesis at L5-S1.  IMPRESSION: 1. No retroperitoneal or other acute hemorrhage. 2. Descending colostomy with peristomal hernia containing nonobstructed  bowel. 3. Possible cirrhosis.  Correlate for risk factors.   Electronically Signed   By: Monte Fantasia M.D.   On: 11/13/2014 21:37   Dg Chest 2 View  11/11/2014   CLINICAL DATA:  Nose please 6 hours. Decreased red blood cells and hemoglobin.  EXAM: CHEST  2 VIEW  COMPARISON:  11/13/2012 and 11/08/2012  FINDINGS: Left-sided pacemaker is intact and unchanged. Lungs are adequately inflated as patient is slightly rotated to the right. There is no focal consolidation or effusion. Cardiomediastinal silhouette is within normal. There is a stable calcified granuloma over the left upper lobe. Old right mid lateral rib fractures. Degenerative changes of the spine are present.  IMPRESSION: No active cardiopulmonary disease.   Electronically Signed   By: Marin Olp M.D.   On: 11/11/2014 20:54   [place] Extrem Up Left Ltd  10/22/2014   CLINICAL DATA:  History of left breast cancer. The patient was called back from screening mammogram for a possible mass in left breast. The diagnostic study was dictated separately. Patient complains of focal pain in the left axilla.  EXAM: ULTRASOUND OF THE LEFT BREAST  COMPARISON:  Previous exam(s).  FINDINGS: On physical exam, I do not palpate a mass in the upper-outer quadrant of the left breast or the left axilla.  Targeted ultrasound is performed, showing normal tissue in the left axilla. No enlarged  adenopathy is visualized. There is no suspicious mass in the upper-outer quadrant of the left breast.  IMPRESSION: Negative left diagnostic mammogram and ultrasound.  RECOMMENDATION: Bilateral screening mammogram in 1 year is recommended.  I have discussed the findings and recommendations with the patient. Results were also provided in writing at the conclusion of the visit. If applicable, a reminder letter will be sent to the patient regarding the next appointment.  BI-RADS CATEGORY  2: Benign Finding(s)   Electronically Signed   By: Lillia Mountain M.D.   On: 10/22/2014 15:40   Mm Digital Screening Bilateral  10/17/2014   CLINICAL DATA:  Screening.  EXAM: DIGITAL SCREENING BILATERAL MAMMOGRAM WITH CAD  COMPARISON:  Previous exam(s).  ACR Breast Density Category b: There are scattered areas of fibroglandular density.  FINDINGS: In the left breast, a possible mass warrants further evaluation. In the right breast, no findings suspicious for malignancy.  Images were processed with CAD.  IMPRESSION: Further evaluation is suggested for possible mass in the left breast.  RECOMMENDATION: Diagnostic mammogram and possibly ultrasound of the left breast. (Code:[id number]-L-54M)  The patient will be contacted regarding the findings, and additional imaging will be scheduled.  BI-RADS CATEGORY  0: Incomplete. Need additional imaging evaluation and/or prior mammograms for comparison.   Electronically Signed   By: Abelardo Diesel M.D.   On: 10/17/2014 15:31   Mm Diag Breast Tomo Uni Left  10/22/2014   CLINICAL DATA:  History of left breast cancer status post lumpectomy in 2004. Patient was called back from screening mammogram for a possible left breast mass.  EXAM: DIGITAL DIAGNOSTIC LEFT MAMMOGRAM WITH 3D TOMOSYNTHESIS AND CAD  COMPARISON:  With priors.  ACR Breast Density Category b: There are scattered areas of fibroglandular density.  FINDINGS: Stable lumpectomy changes are seen in the left breast. No persistent mass or malignant type  microcalcifications identified on the additional imaging.  Mammographic images were processed with CAD.  IMPRESSION: No evidence of malignancy in the left breast.  RECOMMENDATION: Bilateral screening mammogram in 1 year is recommended.  I have discussed the findings and recommendations with the patient. Results  were also provided in writing at the conclusion of the visit. If applicable, a reminder letter will be sent to the patient regarding the next appointment.  BI-RADS CATEGORY  2: Benign.   Electronically Signed   By: Lillia Mountain M.D.   On: 10/22/2014 15:03    Microbiology: Recent Results (from the past 240 hour(s))  MRSA PCR Screening     Status: Abnormal   Collection Time: 11/12/14  2:35 AM  Result Value Ref Range Status   MRSA by PCR POSITIVE (A) NEGATIVE Final    Comment:        The GeneXpert MRSA Assay (FDA approved for NASAL specimens only), is one component of a comprehensive MRSA colonization surveillance program. It is not intended to diagnose MRSA infection nor to guide or monitor treatment for MRSA infections. RESULT CALLED TO, READ BACK BY AND VERIFIED WITH: K.Dickenson RN [phone] 11/12/14 E.GADDY      Labs: Basic Metabolic Panel:  Recent Labs Lab 11/11/14 1944 11/12/14 0720 11/14/14 0629  NA 139 139 140  K 4.4 4.3 4.2  CL 105 107 108  CO2 25 21* 23  GLUCOSE 119* 127* 93  BUN 30* 25* 27*  CREATININE 1.15* 1.14* 1.17*  CALCIUM 9.7 9.2 9.1   Liver Function Tests:  Recent Labs Lab 11/12/14 0720  AST 26  ALT 24  ALKPHOS 39  BILITOT 0.5  PROT 5.8*  ALBUMIN 3.7   No results for input(s): LIPASE, AMYLASE in the last 168 hours. No results for input(s): AMMONIA in the last 168 hours. CBC:  Recent Labs Lab 11/11/14 1944 11/12/14 0720 11/13/14 0505 11/14/14 0629 11/15/14 0435  WBC 6.3 7.7  --  7.8 6.6  NEUTROABS 4.3  --   --   --  3.4  HGB 7.8* 7.4* 9.0* 9.8* 8.7*  HCT 24.2* 22.2* 26.5* 29.2* 25.7*  MCV 103.4* 100.5*  --  97.0 95.2  PLT 149* 149*   --  145* 133*   Cardiac Enzymes: No results for input(s): CKTOTAL, CKMB, CKMBINDEX, TROPONINI in the last 168 hours. BNP: BNP (last 3 results) No results for input(s): BNP in the last 8760 hours.  ProBNP (last 3 results) No results for input(s): PROBNP in the last 8760 hours.  CBG: No results for input(s): GLUCAP in the last 168 hours.     Signed:  Velvet Bathe  [facility] 11/16/2014, 10:34 AM

## 2014-11-16 NOTE — Consult Note (Signed)
[facility] CONSULT NOTE  Patient Care Team: Prince Solian, MD as PCP - General Gatha Mayer, MD as Consulting Physician (Gastroenterology) Evans Lance, MD as Consulting Physician (Cardiology) Collene Gobble, MD as Consulting Physician (Pulmonary Disease) Jiles Prows, MD as Attending Physician (General Practice) Heath Lark, MD as Consulting Physician (Hematology and Oncology)  CHIEF COMPLAINTS/PURPOSE OF CONSULTATION:  Severe anemia  HISTORY OF PRESENTING ILLNESS:  [name] 81 y.o. female is here because of severe nose bleeding and anemia. Patient has a history of chronic anemia in the past required blood transfusions as well as iron infusions and B-12 injections. Last iron infusion was in July 2014. She previously had angiodysplasia of the stomach that was addressed by gastroenterology. She presented to her primary care physician with hemoglobin of 8.8. She also had epistaxis and when she presented to the emergency room she had a hemoglobin of 7.4. She has chronic COPD and presented with cough as well. She received 2 units of packed red cells and her hemoglobin improved to 9.8 but on 11/15/2014 it went back down to 8.7 which prompted the hematology consult. Patient sees Dr. Alvy Bimler as an outpatient who has been monitoring her condition. Stool Hemoccults were negative, CT abdomen and pelvis did not show any retroperitoneal bleed.  I reviewed her records extensively and collaborated the history with the patient.  MEDICAL HISTORY:  Past Medical History  Diagnosis Date  . Insomnia, unspecified   . Osteoarthrosis, unspecified whether generalized or localized, unspecified site   . Unspecified hypothyroidism   . Unspecified essential hypertension   . Atrial fibrillation   . Sciatica   . Anemia   . COPD (chronic obstructive pulmonary disease)   . UTI (urinary tract infection)   . GERD (gastroesophageal reflux disease)   . Pneumonia   . Asthma   . Clostridium  difficile colitis   . Diverticulitis of sigmoid colon 11/2010    with abscess/contained perforation  . Dementia     mild  . Dehydration with hyponatremia 12/2010    hospitalized  . Angiodysplasia of stomach     ablated 2010  . Angiodysplasia of colon     ablated 2010  . Vitamin B12 deficiency (dietary) anemia 04/12/2011  . Shingles   . Sarcoma     right hand  . Malignant neoplasm of breast (female), unspecified site     left  . Dupuytren's disease     left hand  . Vitamin B12 deficiency 04/02/2012  . Complication of anesthesia     " very cautious because of my COPD"  . Pacemaker     SURGICAL HISTORY: Past Surgical History  Procedure Laterality Date  . Nasal sinus surgery      x 3  . Appendectomy    . Hernia repair    . Abdominal hysterectomy    . Pacemaker insertion    . Eye surgery      x6-bilateral  . Finger amputation      right  . Foot surgery      right toes x 3  . Tonsillectomy    . Colonoscopy  01/2009    w/ablation of 3 mm AVM, diverticulosis, external hemorrhoids  . Upper gastrointestinal endoscopy  01/2009    w/polypectomy, AVM, hyperplastic polyp  . Abdominal hysterectomy    . Laparotomy  11/12/2011    Procedure: EXPLORATORY LAPAROTOMY;  Surgeon: Madilyn Hook, DO;  Location: [facility];  Service: General;  Laterality: N/A;  . Colostomy revision  11/12/2011  Procedure: COLON RESECTION SIGMOID;  Surgeon: Madilyn Hook, DO;  Location: [facility];  Service: General;  Laterality: N/A;  . Colostomy  11/12/2011    Procedure: COLOSTOMY;  Surgeon: Madilyn Hook, DO;  Location: [facility];  Service: General;  Laterality: Left;  End colostomy  . Laparotomy  11/19/2011    Procedure: EXPLORATORY LAPAROTOMY;  Surgeon: Shann Medal, MD;  Location: [facility];  Service: General;  Laterality: N/A;  . Gastrostomy  11/19/2011    Procedure: GASTROSTOMY;  Surgeon: Shann Medal, MD;  Location: [facility];  Service: General;;  INSERTION GASTROSTOMY TUBE  . Application of wound vac  11/19/2011     Procedure: APPLICATION OF WOUND VAC;  Surgeon: Shann Medal, MD;  Location: [facility];  Service: General;  Laterality: N/A;    SOCIAL HISTORY: History   Social History  . Marital Status: Widowed    Spouse Name: N/A  . Number of Children: 3  . Years of Education: N/A   Occupational History  . Home Maker    Social History Main Topics  . Smoking status: Former Smoker -- 0.50 packs/day for 21 years    Types: Cigarettes    Quit date: 06/20/1977  . Smokeless tobacco: Never Used  . Alcohol Use: Yes     Comment: 1 glass wine/night  . Drug Use: No  . Sexual Activity: Not on file   Other Topics Concern  . Not on file   Social History Narrative    FAMILY HISTORY: Family History  Problem Relation Age of Onset  . Emphysema Paternal Uncle   . Heart disease Mother   . Colon cancer Mother   . Pancreatic cancer Father   . Cancer Father 21    pancreatic cancer  . Tuberculosis Paternal Uncle   . Breast cancer Maternal Aunt   . Breast cancer Cousin   . Prostate cancer Cousin   . Colon cancer Cousin   . Uterine cancer Cousin     ALLERGIES:  is allergic to aspirin; sulfonamide derivatives; ibuprofen; iron dextran; sulfa antibiotics; tape; and metoclopramide hcl.  MEDICATIONS:  Current Facility-Administered Medications  Medication Dose Route Frequency Provider Last Rate Last Dose  . 0.9 %  sodium chloride infusion   Intravenous Once Velvet Bathe, MD 10 mL/hr at 11/12/14 1545    . albuterol (PROVENTIL) (2.5 MG/3ML) 0.083% nebulizer solution 2.5 mg  2.5 mg Nebulization Q4H PRN Jani Gravel, MD   2.5 mg at 11/13/14 1304  . albuterol (PROVENTIL) (2.5 MG/3ML) 0.083% nebulizer solution 3 mL  3 mL Inhalation BID Jani Gravel, MD   3 mL at 11/15/14 2016  . ALPRAZolam Duanne Moron) tablet 0.25 mg  0.25 mg Oral Q4H PRN Jani Gravel, MD   0.25 mg at 11/15/14 2238  . baclofen (LIORESAL) tablet 10 mg  10 mg Oral Q8H PRN Jani Gravel, MD      . budesonide (PULMICORT) nebulizer solution 0.25 mg  2 mL Inhalation BID  Jani Gravel, MD   0.25 mg at 11/15/14 2016  . Chlorhexidine Gluconate Cloth 2 % PADS 6 each  6 each Topical Q0600 Ripudeep Krystal Eaton, MD   6 each at 11/15/14 1032  . diphenhydrAMINE (BENADRYL) capsule 25 mg  25 mg Oral QHS PRN Jani Gravel, MD      . DULoxetine (CYMBALTA) DR capsule 30 mg  30 mg Oral Daily Jani Gravel, MD   30 mg at 11/15/14 1032  . famotidine (PEPCID) tablet 20 mg  20 mg Oral BID Jani Gravel, MD   20  mg at 11/15/14 2223  . levothyroxine (SYNTHROID, LEVOTHROID) tablet 50 mcg  50 mcg Oral QAC breakfast Jani Gravel, MD   50 mcg at 11/15/14 0751  . losartan (COZAAR) tablet 50 mg  50 mg Oral Daily Jani Gravel, MD   50 mg at 11/15/14 1033  . metoprolol succinate (TOPROL-XL) 24 hr tablet 50 mg  50 mg Oral Daily Jani Gravel, MD   50 mg at 11/15/14 1033  . mirtazapine (REMERON) tablet 30 mg  30 mg Oral QHS Jani Gravel, MD   30 mg at 11/15/14 2223  . montelukast (SINGULAIR) tablet 10 mg  10 mg Oral QHS Jani Gravel, MD   10 mg at 11/15/14 2223  . multivitamin with minerals tablet 1 tablet  1 tablet Oral Daily Jani Gravel, MD   1 tablet at 11/15/14 1033  . mupirocin ointment (BACTROBAN) 2 % 1 application  1 application Nasal BID Ripudeep Krystal Eaton, MD   1 application at 91/79/15 2223  . oxyCODONE (Oxy IR/ROXICODONE) immediate release tablet 5-10 mg  5-10 mg Oral Q4H PRN Jani Gravel, MD   10 mg at 11/12/14 0113  . polyethylene glycol (MIRALAX / GLYCOLAX) packet 17 g  17 g Oral Daily Jani Gravel, MD   17 g at 11/15/14 1030  . pravastatin (PRAVACHOL) tablet 40 mg  40 mg Oral q1800 Jani Gravel, MD   40 mg at 11/15/14 1702  . predniSONE (DELTASONE) tablet 5 mg  5 mg Oral Q breakfast Jani Gravel, MD   5 mg at 11/15/14 1033  . senna-docusate (Senokot-S) tablet 1 tablet  1 tablet Oral Daily Jani Gravel, MD   1 tablet at 11/15/14 1034  . sodium chloride 0.9 % injection 3 mL  3 mL Intravenous Q12H Jani Gravel, MD   3 mL at 11/15/14 2223  . Vitamin D (Ergocalciferol) (DRISDOL) capsule 50,000 Units  50,000 Units Oral Q Greer Pickerel, MD    50,000 Units at 11/14/14 [phone]    REVIEW OF SYSTEMS:   Constitutional: Denies fevers, chills or abnormal night sweats Eyes: Denies blurriness of vision, double vision or watery eyes Ears, nose, mouth, throat, and face: Epistaxis that required packing Respiratory: Denies cough, dyspnea or wheezes Cardiovascular: Denies palpitation, chest discomfort or lower extremity swelling Gastrointestinal:  Colostomy Skin: Denies abnormal skin rashes Lymphatics: Denies new lymphadenopathy or easy bruising Neurological:Denies numbness, tingling or new weaknesses Behavioral/Psych: Mood is stable, no new changes  All other systems were reviewed with the patient and are negative.  PHYSICAL EXAMINATION: ECOG PERFORMANCE STATUS: 1 - Symptomatic but completely ambulatory  Filed Vitals:   11/16/14 0530  BP: 119/58  Pulse:   Temp: 98.4 F (36.9 C)  Resp: 18   Filed Weights   11/14/14 0421 11/15/14 0602 11/16/14 0530  Weight: 175 lb 7.8 oz (79.6 kg) 172 lb 6.4 oz (78.2 kg) 174 lb 9.7 oz (79.2 kg)    GENERAL:alert, no distress and comfortable SKIN: Easy bruising EYES: normal, conjunctiva are pink and non-injected, sclera clear OROPHARYNX:no exudate, no erythema and lips, buccal mucosa, and tongue normal  NECK: supple, thyroid normal size, non-tender, without nodularity LYMPH:  no palpable lymphadenopathy in the cervical, axillary or inguinal LUNGS: clear to auscultation and percussion with normal breathing effort HEART: regular rate & rhythm and no murmurs and no lower extremity edema ABDOMEN: Colostomy for previous history of diverticulitis Musculoskeletal:no cyanosis of digits and no clubbing  PSYCH: alert & oriented x 3 with fluent speech NEURO: no focal motor/sensory deficits  LABORATORY DATA:  I  have reviewed the data as listed Lab Results  Component Value Date   WBC 6.6 11/15/2014   HGB 8.7* 11/15/2014   HCT 25.7* 11/15/2014   MCV 95.2 11/15/2014   PLT 133* 11/15/2014   Lab Results   Component Value Date   NA 140 11/14/2014   K 4.2 11/14/2014   CL 108 11/14/2014   CO2 23 11/14/2014   CMP Latest Ref Rng 11/14/2014 11/12/2014 11/11/2014  Glucose 65 - 99 mg/dL 93 127(H) 119(H)  BUN 6 - 20 mg/dL 27(H) 25(H) 30(H)  Creatinine 0.44 - 1.00 mg/dL 1.17(H) 1.14(H) 1.15(H)  Sodium 135 - 145 mmol/L 140 139 139  Potassium 3.5 - 5.1 mmol/L 4.2 4.3 4.4  Chloride 101 - 111 mmol/L 108 107 105  CO2 22 - 32 mmol/L 23 21(L) 25  Calcium 8.9 - 10.3 mg/dL 9.1 9.2 9.7  Total Protein 6.5 - 8.1 g/dL - 5.8(L) -  Total Bilirubin 0.3 - 1.2 mg/dL - 0.5 -  Alkaline Phos 38 - 126 U/L - 39 -  AST 15 - 41 U/L - 26 -  ALT 14 - 54 U/L - 24 -     ASSESSMENT AND PLAN:  1. Severe anemia: I believe the cause of her anemia is multifactorial. I reviewed the blood work as well as reviewed her blood smear. Her reticulocyte count is not elevated. Haptoglobin from this admission is pending. Z-30 and folic acid are normal. I do not believe the anemia is related to acute bleeding. Stool Hemoccults are negative. She may have underlying small amount of GI bleeding but it is unlikely the only cause for anemia. Send for iron studies.  Clinical suspicion:  Anemia of chronic disease, factors include renal dysfunction, liver dysfunction although her liver function tests are normal. Versus myelodysplasia Given her general condition, a conservative approach is ideal. I agree with supporting her with blood transfusions and close clinical follow-up.  2. Direct Coombs test IgG negative complement positive: Does not automatically mean autoimmune hemolytic anemia. It could be related to complement activation for many reasons. Her reticulocyte count is not elevated. I believe the LDH is elevated due to underlying liver dysfunction. Blood smear does not show evidence of hemolysis. Will send for cold agglutinins and quantitative immunoglobulins (sometimes this may cause positive complement pos coombs test)  3. Easy bruising:  Most likely related to platelet dysfunction. Could be multifactorial. CT scan suggestive of cirrhosis. This could explain the mild thrombocytopenia. This is also the cause of her nose bleeds. Could be medication induced.    Rulon Eisenmenger, MD 8:07 AM

## 2014-11-16 NOTE — Progress Notes (Signed)
LCSW made aware that patient is ready for DC today.  Patient will be transported by family/daughter.  Spoke with facility who are needing a DC summary faxed.  Agreeable to accept back and return to room.  No higher level of care needed at this time.  LCSW will fax DC: [phone]  FL2 has been signed.  LCSW added medications from DC Summary on FL2. No barriers to DC. Patient agreeable and daughter agreeable.  Lane Hacker, MSW Clinical Social Work: Emergency Room [phone]

## 2014-11-16 NOTE — Progress Notes (Signed)
Patient was discharged to [facility]) by MD order; discharged instructions  review and sent to facility with care notes; IV DIC;  patient will be transported to facility by her daughter. Facility was called and informed about patient's discharged. Patient will be escorted to the car by nurse tech via wheelchair.

## 2014-11-17 ENCOUNTER — Other Ambulatory Visit: Payer: Self-pay | Admitting: Hematology and Oncology

## 2014-11-17 DIAGNOSIS — D509 Iron deficiency anemia, unspecified: Secondary | ICD-10-CM

## 2014-11-17 LAB — COLD AGGLUTININ TITER: Cold Agglutinin Titer: NEGATIVE

## 2014-11-17 LAB — IGG, IGA, IGM
IGA: 35 mg/dL — AB (ref 64–422)
IGG (IMMUNOGLOBIN G), SERUM: 456 mg/dL — AB (ref 700–1600)
IgM, Serum: 73 mg/dL (ref 26–217)

## 2014-11-18 ENCOUNTER — Telehealth: Payer: Self-pay | Admitting: Hematology and Oncology

## 2014-11-18 SURGERY — EGD (ESOPHAGOGASTRODUODENOSCOPY)
Anesthesia: Monitor Anesthesia Care

## 2014-11-18 NOTE — Telephone Encounter (Signed)
Returned patient call re being her recent hospitalization and need an appointment with NG. Per patient she was recently discharged re hematology issues and it was suggested that she follow up with NG who treated her for [facility]. Message to triage.

## 2014-11-19 ENCOUNTER — Telehealth: Payer: Self-pay | Admitting: Hematology and Oncology

## 2014-11-19 NOTE — Telephone Encounter (Signed)
Appointments made and per pof patient is aware

## 2014-11-19 NOTE — Telephone Encounter (Signed)
Informed pt of appt on 6/14 to arrive at 12:30 pm for labs.  She verbalized understanding.

## 2014-11-19 NOTE — Telephone Encounter (Signed)
-----   Message from Heath Lark, MD sent at 11/17/2014  7:38 PM EDT ----- Regarding: hospital follow-up Looks like she was in the hospital recently Can you call and see if she can come in on 6/14 with labs and see me at 1 pm? 30 mins

## 2014-11-21 ENCOUNTER — Encounter (HOSPITAL_COMMUNITY): Payer: Self-pay | Admitting: Internal Medicine

## 2014-11-21 ENCOUNTER — Observation Stay (HOSPITAL_COMMUNITY)
Admission: EM | Admit: 2014-11-21 | Discharge: 2014-11-27 | Disposition: A | Discharge status: Skilled Nursing Facility | Payer: Medicare Other | Attending: Internal Medicine | Admitting: Internal Medicine

## 2014-11-21 DIAGNOSIS — F419 Anxiety disorder, unspecified: Secondary | ICD-10-CM | POA: Insufficient documentation

## 2014-11-21 DIAGNOSIS — J438 Other emphysema: Secondary | ICD-10-CM | POA: Diagnosis present

## 2014-11-21 DIAGNOSIS — D132 Benign neoplasm of duodenum: Principal | ICD-10-CM | POA: Insufficient documentation

## 2014-11-21 DIAGNOSIS — Z853 Personal history of malignant neoplasm of breast: Secondary | ICD-10-CM | POA: Diagnosis not present

## 2014-11-21 DIAGNOSIS — Z515 Encounter for palliative care: Secondary | ICD-10-CM

## 2014-11-21 DIAGNOSIS — K922 Gastrointestinal hemorrhage, unspecified: Secondary | ICD-10-CM | POA: Diagnosis present

## 2014-11-21 DIAGNOSIS — K449 Diaphragmatic hernia without obstruction or gangrene: Secondary | ICD-10-CM | POA: Diagnosis not present

## 2014-11-21 DIAGNOSIS — I4891 Unspecified atrial fibrillation: Secondary | ICD-10-CM | POA: Insufficient documentation

## 2014-11-21 DIAGNOSIS — I878 Other specified disorders of veins: Secondary | ICD-10-CM | POA: Insufficient documentation

## 2014-11-21 DIAGNOSIS — Z882 Allergy status to sulfonamides status: Secondary | ICD-10-CM | POA: Diagnosis not present

## 2014-11-21 DIAGNOSIS — I129 Hypertensive chronic kidney disease with stage 1 through stage 4 chronic kidney disease, or unspecified chronic kidney disease: Secondary | ICD-10-CM | POA: Insufficient documentation

## 2014-11-21 DIAGNOSIS — J449 Chronic obstructive pulmonary disease, unspecified: Secondary | ICD-10-CM | POA: Diagnosis not present

## 2014-11-21 DIAGNOSIS — K219 Gastro-esophageal reflux disease without esophagitis: Secondary | ICD-10-CM | POA: Insufficient documentation

## 2014-11-21 DIAGNOSIS — K5730 Diverticulosis of large intestine without perforation or abscess without bleeding: Secondary | ICD-10-CM | POA: Diagnosis not present

## 2014-11-21 DIAGNOSIS — N189 Chronic kidney disease, unspecified: Secondary | ICD-10-CM | POA: Diagnosis not present

## 2014-11-21 DIAGNOSIS — Z79899 Other long term (current) drug therapy: Secondary | ICD-10-CM | POA: Insufficient documentation

## 2014-11-21 DIAGNOSIS — K2970 Gastritis, unspecified, without bleeding: Secondary | ICD-10-CM | POA: Diagnosis not present

## 2014-11-21 DIAGNOSIS — F0390 Unspecified dementia without behavioral disturbance: Secondary | ICD-10-CM | POA: Diagnosis not present

## 2014-11-21 DIAGNOSIS — N289 Disorder of kidney and ureter, unspecified: Secondary | ICD-10-CM

## 2014-11-21 DIAGNOSIS — Z7952 Long term (current) use of systemic steroids: Secondary | ICD-10-CM | POA: Insufficient documentation

## 2014-11-21 DIAGNOSIS — E538 Deficiency of other specified B group vitamins: Secondary | ICD-10-CM | POA: Insufficient documentation

## 2014-11-21 DIAGNOSIS — F329 Major depressive disorder, single episode, unspecified: Secondary | ICD-10-CM | POA: Diagnosis present

## 2014-11-21 DIAGNOSIS — E039 Hypothyroidism, unspecified: Secondary | ICD-10-CM | POA: Diagnosis not present

## 2014-11-21 DIAGNOSIS — Z950 Presence of cardiac pacemaker: Secondary | ICD-10-CM | POA: Insufficient documentation

## 2014-11-21 DIAGNOSIS — Z87891 Personal history of nicotine dependence: Secondary | ICD-10-CM | POA: Diagnosis not present

## 2014-11-21 DIAGNOSIS — D649 Anemia, unspecified: Secondary | ICD-10-CM | POA: Diagnosis present

## 2014-11-21 DIAGNOSIS — Z886 Allergy status to analgesic agent status: Secondary | ICD-10-CM | POA: Insufficient documentation

## 2014-11-21 DIAGNOSIS — R040 Epistaxis: Secondary | ICD-10-CM | POA: Insufficient documentation

## 2014-11-21 DIAGNOSIS — Z452 Encounter for adjustment and management of vascular access device: Secondary | ICD-10-CM

## 2014-11-21 DIAGNOSIS — M720 Palmar fascial fibromatosis [Dupuytren]: Secondary | ICD-10-CM | POA: Insufficient documentation

## 2014-11-21 DIAGNOSIS — F32A Depression, unspecified: Secondary | ICD-10-CM | POA: Diagnosis present

## 2014-11-21 DIAGNOSIS — R195 Other fecal abnormalities: Secondary | ICD-10-CM | POA: Insufficient documentation

## 2014-11-21 DIAGNOSIS — D539 Nutritional anemia, unspecified: Secondary | ICD-10-CM | POA: Diagnosis not present

## 2014-11-21 DIAGNOSIS — M1990 Unspecified osteoarthritis, unspecified site: Secondary | ICD-10-CM | POA: Insufficient documentation

## 2014-11-21 DIAGNOSIS — N179 Acute kidney failure, unspecified: Secondary | ICD-10-CM | POA: Diagnosis present

## 2014-11-21 DIAGNOSIS — M5430 Sciatica, unspecified side: Secondary | ICD-10-CM | POA: Diagnosis not present

## 2014-11-21 DIAGNOSIS — K644 Residual hemorrhoidal skin tags: Secondary | ICD-10-CM | POA: Insufficient documentation

## 2014-11-21 DIAGNOSIS — Q2733 Arteriovenous malformation of digestive system vessel: Secondary | ICD-10-CM | POA: Insufficient documentation

## 2014-11-21 DIAGNOSIS — E038 Other specified hypothyroidism: Secondary | ICD-10-CM | POA: Diagnosis present

## 2014-11-21 DIAGNOSIS — K3189 Other diseases of stomach and duodenum: Secondary | ICD-10-CM

## 2014-11-21 DIAGNOSIS — D62 Acute posthemorrhagic anemia: Secondary | ICD-10-CM | POA: Diagnosis present

## 2014-11-21 DIAGNOSIS — I10 Essential (primary) hypertension: Secondary | ICD-10-CM | POA: Diagnosis present

## 2014-11-21 HISTORY — DX: Unspecified asthma, uncomplicated: J45.909

## 2014-11-21 HISTORY — DX: Anxiety disorder, unspecified: F41.9

## 2014-11-21 HISTORY — DX: Other specified postprocedural states: Z98.890

## 2014-11-21 HISTORY — DX: Malignant (primary) neoplasm, unspecified: C80.1

## 2014-11-21 HISTORY — DX: Disorder of kidney and ureter, unspecified: N28.9

## 2014-11-21 HISTORY — DX: Other specified postprocedural states: R11.2

## 2014-11-21 HISTORY — DX: Presence of cardiac pacemaker: Z95.0

## 2014-11-21 HISTORY — DX: Essential (primary) hypertension: I10

## 2014-11-21 HISTORY — DX: Hypothyroidism, unspecified: E03.9

## 2014-11-21 LAB — BASIC METABOLIC PANEL
ANION GAP: 8 (ref 5–15)
BUN: 30 mg/dL — AB (ref 6–20)
CALCIUM: 9.5 mg/dL (ref 8.9–10.3)
CO2: 25 mmol/L (ref 22–32)
Chloride: 103 mmol/L (ref 101–111)
Creatinine, Ser: 1.17 mg/dL — ABNORMAL HIGH (ref 0.44–1.00)
GFR, EST AFRICAN AMERICAN: 50 mL/min — AB (ref >60)
GFR, EST NON AFRICAN AMERICAN: 43 mL/min — AB (ref >60)
Glucose, Bld: 103 mg/dL — ABNORMAL HIGH (ref 65–99)
Potassium: 3.8 mmol/L (ref 3.5–5.1)
Sodium: 136 mmol/L (ref 135–145)

## 2014-11-21 LAB — CBC WITH DIFFERENTIAL/PLATELET
Basophils Absolute: 0 10*3/uL (ref 0.0–0.1)
Basophils Relative: 0 % (ref 0–1)
EOS PCT: 1 % (ref 0–5)
Eosinophils Absolute: 0.1 10*3/uL (ref 0.0–0.7)
HCT: 22.7 % — ABNORMAL LOW (ref 36.0–46.0)
HEMOGLOBIN: 7.5 g/dL — AB (ref 12.0–15.0)
LYMPHS PCT: 16 % (ref 12–46)
Lymphs Abs: 1.1 10*3/uL (ref 0.7–4.0)
MCH: 31.8 pg (ref 26.0–34.0)
MCHC: 33 g/dL (ref 30.0–36.0)
MCV: 96.2 fL (ref 78.0–100.0)
MONOS PCT: 9 % (ref 3–12)
Monocytes Absolute: 0.6 10*3/uL (ref 0.1–1.0)
NEUTROS ABS: 5 10*3/uL (ref 1.7–7.7)
Neutrophils Relative %: 74 % (ref 43–77)
Platelets: 136 10*3/uL — ABNORMAL LOW (ref 150–400)
RBC: 2.36 MIL/uL — ABNORMAL LOW (ref 3.87–5.11)
RDW: 16.9 % — AB (ref 11.5–15.5)
WBC: 6.8 10*3/uL (ref 4.0–10.5)

## 2014-11-21 LAB — POC OCCULT BLOOD, ED: FECAL OCCULT BLD: POSITIVE — AB

## 2014-11-21 LAB — CUP PACEART REMOTE DEVICE CHECK
Battery Impedance: 4930 Ohm
Battery Voltage: 2.63 V
Brady Statistic AP VP Percent: 11 %
Brady Statistic AS VP Percent: 88 %
Lead Channel Impedance Value: 409 Ohm
Lead Channel Impedance Value: 620 Ohm
Lead Channel Setting Pacing Amplitude: 2.25 V
Lead Channel Setting Pacing Pulse Width: 0.4 ms
Lead Channel Setting Sensing Sensitivity: 2.8 mV
MDC IDC MSMT BATTERY REMAINING LONGEVITY: 4 mo
MDC IDC SESS DTM: 20160512174249
MDC IDC SET LEADCHNL RV PACING AMPLITUDE: 2.5 V
MDC IDC STAT BRADY AP VS PERCENT: 0 %
MDC IDC STAT BRADY AS VS PERCENT: 0 %

## 2014-11-21 LAB — I-STAT TROPONIN, ED: Troponin i, poc: 0.02 ng/mL (ref 0.00–0.08)

## 2014-11-21 LAB — PREPARE RBC (CROSSMATCH)

## 2014-11-21 LAB — ABO/RH: ABO/RH(D): A POS

## 2014-11-21 MED ORDER — SODIUM CHLORIDE 0.9 % IJ SOLN: 3.0000 mL | INTRAMUSCULAR | Status: DC | PRN

## 2014-11-21 MED ORDER — SODIUM CHLORIDE 0.9 % IJ SOLN: 3.0000 mL | Freq: Two times a day (BID) | INTRAMUSCULAR | Status: DC

## 2014-11-21 MED ORDER — PANTOPRAZOLE SODIUM 40 MG IV SOLR
40.0000 mg | Freq: Two times a day (BID) | INTRAVENOUS | Status: DC
  Filled 2014-11-21 (×2): qty 40

## 2014-11-21 MED ORDER — ACETAMINOPHEN 650 MG RE SUPP: 650.0000 mg | Freq: Four times a day (QID) | RECTAL | Status: DC | PRN

## 2014-11-21 MED ORDER — SODIUM CHLORIDE 0.9 % IV SOLN
80.0000 mg | Freq: Once | INTRAVENOUS | Status: AC
  Administered 2014-11-21: 80 mg via INTRAVENOUS
  Filled 2014-11-21: qty 80

## 2014-11-21 MED ORDER — SODIUM CHLORIDE 0.9 % IV BOLUS (SEPSIS)
500.0000 mL | Freq: Once | INTRAVENOUS | Status: AC
  Administered 2014-11-21: 500 mL via INTRAVENOUS

## 2014-11-21 MED ORDER — ACETAMINOPHEN 325 MG PO TABS
650.0000 mg | ORAL_TABLET | Freq: Four times a day (QID) | ORAL | Status: DC | PRN
  Administered 2014-11-24 – 2014-11-25 (×2): 650 mg via ORAL
  Filled 2014-11-21 (×2): qty 2

## 2014-11-21 MED ORDER — SODIUM CHLORIDE 0.9 % IV SOLN: 250.0000 mL | INTRAVENOUS | Status: DC | PRN

## 2014-11-21 MED ORDER — SODIUM CHLORIDE 0.9 % IV SOLN
Freq: Once | INTRAVENOUS | Status: AC
Start: 2014-11-21 — End: 2014-11-23
  Administered 2014-11-23: 1 mL via INTRAVENOUS

## 2014-11-21 NOTE — ED Notes (Signed)
PER EMS: pt from [facility] assisted [facility] facility; sent here for low hgb of 6.5. Denies lightheadedness or dizziness. Was admitted for same last week and received blood transfusion. BP-112/64, HR-90, 98% RA. 18RR. A&OX4. Pt has ostomy and double hernia, pacemaker.

## 2014-11-21 NOTE — ED Provider Notes (Signed)
CSN: [id number]     Arrival date & time 11/21/14  1551 History   First MD Initiated Contact with Patient 11/21/14 1610     Chief Complaint  Patient presents with  . Anemia     (Consider location/radiation/quality/duration/timing/severity/associated sxs/prior Treatment) HPI [name] is a 81 y.o. female with a history of COPD, colostomy, pacemaker, reported recent admission for anemia comes in for evaluation of anemia. Patient is from [facility] assisted living facility and was sent here for evaluation of a low hemoglobin of 6.5. She reports she was admitted to [facility] last Tuesday for low hemoglobin. She reports she did not receive a endoscopy. She reports she had a follow-up with a hematologist in a month, and has not followed up with anybody since her discharge on Sunday. She reports since her discharge on Sunday she has been increasingly short of breath with any type of exertion. She also reports seeing "dark black stools in my colostomy". She also reports intermittent nosebleeds, which is unusual for her. Patient states she feels well with no discomfort at rest. No other aggravating or modifying factors.  No past medical history on file. No past surgical history on file. No family history on file. History  Substance Use Topics  . Smoking status: Not on file  . Smokeless tobacco: Not on file  . Alcohol Use: Not on file   OB History    No data available     Review of Systems A 10 point review of systems was completed and was negative except for pertinent positives and negatives as mentioned in the history of present illness     Allergies  Aspirin and Sulfa antibiotics  Home Medications   Prior to Admission medications   Not on File   BP 95/40 mmHg  Pulse 80  Temp(Src) 98.2 F (36.8 C) (Oral)  Resp 21  Ht 5' 4.5" (1.638 m)  Wt 178 lb (80.74 kg)  BMI 30.09 kg/m2  SpO2 97% Physical Exam  Constitutional: She is oriented to person, place, and time. She  appears well-developed and well-nourished.  Patient appears pale.  HENT:  Head: Normocephalic and atraumatic.  Mouth/Throat: Oropharynx is clear and moist.  Eyes: Pupils are equal, round, and reactive to light. Right eye exhibits no discharge. Left eye exhibits no discharge. No scleral icterus.  Pale conjunctiva.  Neck: Neck supple.  Cardiovascular: Normal rate, regular rhythm and normal heart sounds.   Pulmonary/Chest: Effort normal and breath sounds normal. No respiratory distress. She has no wheezes. She has no rales.  Abdominal: Soft. There is no tenderness.  No evidence of infection around the colostomy site. Colostomy bag is opaque and I am unable to visualize contents.  Musculoskeletal: She exhibits no tenderness.  1+ pitting edema to left ankle, patient states this is baseline for her.  Neurological: She is alert and oriented to person, place, and time.  Cranial Nerves II-XII grossly intact  Skin: Skin is warm and dry. No rash noted.  Psychiatric: She has a normal mood and affect.  Nursing note and vitals reviewed.   ED Course  Procedures (including critical care time) Labs Review Labs Reviewed  BASIC METABOLIC PANEL - Abnormal; Notable for the following:    Glucose, Bld 103 (*)    BUN 30 (*)    Creatinine, Ser 1.17 (*)    GFR calc non Af Amer 43 (*)    GFR calc Af Amer 50 (*)    All other components within normal limits  CBC WITH  DIFFERENTIAL/PLATELET - Abnormal; Notable for the following:    RBC 2.36 (*)    Hemoglobin 7.5 (*)    HCT 22.7 (*)    RDW 16.9 (*)    Platelets 136 (*)    All other components within normal limits  POC OCCULT BLOOD, ED - Abnormal; Notable for the following:    Fecal Occult Bld POSITIVE (*)    All other components within normal limits  I-STAT TROPOININ, ED  TYPE AND SCREEN  ABO/RH  PREPARE RBC (CROSSMATCH)    Imaging Review No results found.   EKG Interpretation   Date/Time:  Friday November 21 2014 17:23:17 EDT Ventricular Rate:   80 PR Interval:  187 QRS Duration: 131 QT Interval:  425 QTC Calculation: 490 R Axis:   -76 Text Interpretation:  Sinus or ectopic atrial rhythm Left bundle branch  block No previous ECGs available Confirmed by YAO  MD, DAVID ([id number]) on  11/21/2014 5:44:08 PM     Meds given in ED:  Medications  pantoprazole (PROTONIX) 80 mg in sodium chloride 0.9 % 100 mL IVPB (not administered)  0.9 %  sodium chloride infusion (not administered)  sodium chloride 0.9 % bolus 500 mL (not administered)    New Prescriptions   No medications on file   Filed Vitals:   11/21/14 1730 11/21/14 1745 11/21/14 1755 11/21/14 1858  BP: 103/47 115/84 115/84 95/40  Pulse:   74 80  Temp:    98.2 F (36.8 C)  TempSrc:      Resp: 19 18 16 21   Height:      Weight:      SpO2:   98% 97%   CRITICAL CARE Performed by: Verl Dicker   Total critical care time: 35  Critical care time was exclusive of separately billable procedures and treating other patients.  Critical care was necessary to treat or prevent imminent or life-threatening deterioration.  Critical care was time spent personally by me on the following activities: development of treatment plan with patient and/or surrogate as well as nursing, discussions with consultants, evaluation of patient's response to treatment, examination of patient, obtaining history from patient or surrogate, ordering and performing treatments and interventions, ordering and review of laboratory studies, ordering and review of radiographic studies, pulse oximetry and re-evaluation of patient's condition.  MDM  Patient presented with symptomatic anemia, hemoglobin 7.5. Fecal occult positive. Vital signs stable, however most recent blood pressure soft at 95/40, ordered 500 mL bolus. Given Protonix in the ED. Transfused one unit of blood in the ED. Patient recently admitted to hospital and discharged for anemia on 5/24 and discharged 5/29. Spoke with Dr. Olevia Perches, [facility], they will see patient tomorrow. Discussed patient presentation and ED course with attending, Dr. Darl Householder who also saw and evaluated the patient and agrees with plan to admit to medicine. Dr. Maudie Mercury to see in the ED, patient admitted. Final diagnoses:  Anemia, unspecified anemia type  Acute GI bleeding        Comer Locket, PA-C 11/21/14 1925  Wandra Arthurs, MD 11/22/14 [phone]

## 2014-11-21 NOTE — ED Notes (Signed)
1 unit blood ready via phone call from blood bank

## 2014-11-21 NOTE — H&P (Signed)
[name] is an 81 y.o. female.    [employer] political party Complaint: anemia HPI:  81 yo female with hx of anemia, diverticulosis, angiodysplasia of stomach, colon, vitamin b12 def apparently c/o feeling weak,  Pt was noted today to have epistaxis x1, mild.  But was sent over due to  Anemia on routine labs,  hgb 6.8 per pt.  Pt was noted to have hgb 7.5  Here in the ED.  Pt admits to slight sob. In addition to fatigue.  Pt will be admitted for transfusion as per ED request  Pmhx reviewed,  Awaiting merge  History reviewed. No pertinent past medical history.  History reviewed. No pertinent past surgical history.  History reviewed. No pertinent family history. Social History:  reports that she has quit smoking. She does not have any smokeless tobacco history on file. She reports that she does not drink alcohol. Her drug history is not on file.  Allergies:  Allergies  Allergen Reactions  . Aspirin   . Sulfa Antibiotics    Medications reviewed  Results for orders placed or performed during the hospital encounter of 11/21/14 (from the past 48 hour(s))  Basic metabolic panel     Status: Abnormal   Collection Time: 11/21/14  4:20 PM  Result Value Ref Range   Sodium 136 135 - 145 mmol/L   Potassium 3.8 3.5 - 5.1 mmol/L   Chloride 103 101 - 111 mmol/L   CO2 25 22 - 32 mmol/L   Glucose, Bld 103 (H) 65 - 99 mg/dL   BUN 30 (H) 6 - 20 mg/dL   Creatinine, Ser 1.17 (H) 0.44 - 1.00 mg/dL   Calcium 9.5 8.9 - 10.3 mg/dL   GFR calc non Af Amer 43 (L) >60 mL/min   GFR calc Af Amer 50 (L) >60 mL/min    Comment: (NOTE) The eGFR has been calculated using the CKD EPI equation. This calculation has not been validated in all clinical situations. eGFR's persistently <60 mL/min signify possible Chronic Kidney Disease.    Anion gap 8 5 - 15  CBC with Differential     Status: Abnormal   Collection Time: 11/21/14  4:20 PM  Result Value Ref Range   WBC 6.8 4.0 - 10.5 K/uL   RBC 2.36 (L) 3.87 - 5.11  MIL/uL   Hemoglobin 7.5 (L) 12.0 - 15.0 g/dL   HCT 22.7 (L) 36.0 - 46.0 %   MCV 96.2 78.0 - 100.0 fL   MCH 31.8 26.0 - 34.0 pg   MCHC 33.0 30.0 - 36.0 g/dL   RDW 16.9 (H) 11.5 - 15.5 %   Platelets 136 (L) 150 - 400 K/uL   Neutrophils Relative % 74 43 - 77 %   Neutro Abs 5.0 1.7 - 7.7 K/uL   Lymphocytes Relative 16 12 - 46 %   Lymphs Abs 1.1 0.7 - 4.0 K/uL   Monocytes Relative 9 3 - 12 %   Monocytes Absolute 0.6 0.1 - 1.0 K/uL   Eosinophils Relative 1 0 - 5 %   Eosinophils Absolute 0.1 0.0 - 0.7 K/uL   Basophils Relative 0 0 - 1 %   Basophils Absolute 0.0 0.0 - 0.1 K/uL  Type and screen     Status: None (Preliminary result)   Collection Time: 11/21/14  4:21 PM  Result Value Ref Range   ABO/RH(D) A POS    Antibody Screen NEG    Sample Expiration 11/24/2014    Unit Number [id number]    Blood Component  Type RED CELLS,LR    Unit division 00    Status of Unit ISSUED    Transfusion Status OK TO TRANSFUSE    Crossmatch Result Compatible   ABO/Rh     Status: None   Collection Time: 11/21/14  4:21 PM  Result Value Ref Range   ABO/RH(D) A POS   POC occult blood, ED Provider will collect     Status: Abnormal   Collection Time: 11/21/14  5:52 PM  Result Value Ref Range   Fecal Occult Bld POSITIVE (A) NEGATIVE  Prepare RBC     Status: None   Collection Time: 11/21/14  6:31 PM  Result Value Ref Range   Order Confirmation ORDER PROCESSED BY BLOOD BANK    No results found.  Review of Systems  Constitutional: Negative.   HENT: Negative.   Eyes: Negative.   Respiratory: Positive for shortness of breath. Negative for cough, hemoptysis, sputum production and wheezing.   Cardiovascular: Negative.   Gastrointestinal: Negative.        Dark stool on Monday and today  Genitourinary: Negative.   Musculoskeletal: Negative.   Skin: Negative.   Neurological: Negative.   Endo/Heme/Allergies: Negative for environmental allergies and polydipsia. Does not bruise/bleed easily.   Psychiatric/Behavioral: Negative.     Blood pressure 95/40, pulse 80, temperature 98.2 F (36.8 C), temperature source Oral, resp. rate 21, height 5' 4.5" (1.638 m), weight 80.74 kg (178 lb), SpO2 97 %. Physical Exam  Constitutional: She is oriented to person, place, and time. She appears well-developed and well-nourished.  HENT:  Head: Normocephalic and atraumatic.  Pale conjunctiva  Eyes: Conjunctivae and EOM are normal. Pupils are equal, round, and reactive to light. No scleral icterus.  Neck: Normal range of motion. Neck supple. No JVD present. No tracheal deviation present. No thyromegaly present.  Cardiovascular: Normal rate and regular rhythm.  Exam reveals no gallop and no friction rub.   No murmur heard. Respiratory: Breath sounds normal. No respiratory distress. She has no wheezes. She has no rales. She exhibits no tenderness.  GI: Soft. Bowel sounds are normal. She exhibits no distension. There is no tenderness. There is no rebound and no guarding.  Musculoskeletal: Normal range of motion. She exhibits no edema or tenderness.  Lymphadenopathy:    She has no cervical adenopathy.  Neurological: She is alert and oriented to person, place, and time. She has normal reflexes. She displays normal reflexes. No cranial nerve deficit. She exhibits normal muscle tone. Coordination normal.  Skin: Skin is warm and dry. No rash noted. No erythema. There is pallor.  Psychiatric: She has a normal mood and affect. Her behavior is normal. Judgment and thought content normal.     Assessment/Plan Anemia ? GI bleeding from AVM Symptomatic Type and cross as per ED Transfuse 2 units prbc.   Check cbc after transfusion Consider  GI consult if hgb not correcting  Copd Cont current reatments   Renal insufficiency Check cmp in am  Epistaxis:  None presently, please monitor. Please consult ENT as outpatient  DVT prophylaxis:  scd ,  11/21/2014, 7:22 PM    

## 2014-11-21 NOTE — ED Notes (Signed)
Flow called to inquire about bed request.  

## 2014-11-22 DIAGNOSIS — I10 Essential (primary) hypertension: Secondary | ICD-10-CM | POA: Diagnosis present

## 2014-11-22 DIAGNOSIS — E038 Other specified hypothyroidism: Secondary | ICD-10-CM | POA: Diagnosis present

## 2014-11-22 DIAGNOSIS — J438 Other emphysema: Secondary | ICD-10-CM | POA: Diagnosis not present

## 2014-11-22 DIAGNOSIS — F32A Depression, unspecified: Secondary | ICD-10-CM | POA: Diagnosis present

## 2014-11-22 DIAGNOSIS — N179 Acute kidney failure, unspecified: Secondary | ICD-10-CM | POA: Diagnosis present

## 2014-11-22 DIAGNOSIS — R040 Epistaxis: Secondary | ICD-10-CM

## 2014-11-22 DIAGNOSIS — D62 Acute posthemorrhagic anemia: Secondary | ICD-10-CM | POA: Diagnosis present

## 2014-11-22 DIAGNOSIS — K922 Gastrointestinal hemorrhage, unspecified: Secondary | ICD-10-CM

## 2014-11-22 DIAGNOSIS — F329 Major depressive disorder, single episode, unspecified: Secondary | ICD-10-CM | POA: Diagnosis present

## 2014-11-22 LAB — PROTIME-INR
INR: 1.13 (ref 0.00–1.49)
Prothrombin Time: 14.7 seconds (ref 11.6–15.2)

## 2014-11-22 LAB — COMPREHENSIVE METABOLIC PANEL
ALK PHOS: 36 U/L — AB (ref 38–126)
ALT: 19 U/L (ref 14–54)
ANION GAP: 6 (ref 5–15)
AST: 21 U/L (ref 15–41)
Albumin: 3 g/dL — ABNORMAL LOW (ref 3.5–5.0)
BUN: 26 mg/dL — AB (ref 6–20)
CALCIUM: 8.9 mg/dL (ref 8.9–10.3)
CHLORIDE: 106 mmol/L (ref 101–111)
CO2: 28 mmol/L (ref 22–32)
Creatinine, Ser: 0.98 mg/dL (ref 0.44–1.00)
GFR calc Af Amer: >60 mL/min (ref >60)
GFR, EST NON AFRICAN AMERICAN: 54 mL/min — AB (ref >60)
Glucose, Bld: 94 mg/dL (ref 65–99)
POTASSIUM: 3.6 mmol/L (ref 3.5–5.1)
SODIUM: 140 mmol/L (ref 135–145)
Total Bilirubin: 1.6 mg/dL — ABNORMAL HIGH (ref 0.3–1.2)
Total Protein: 4.8 g/dL — ABNORMAL LOW (ref 6.5–8.1)

## 2014-11-22 LAB — CBC
HEMATOCRIT: 24.5 % — AB (ref 36.0–46.0)
Hemoglobin: 8.4 g/dL — ABNORMAL LOW (ref 12.0–15.0)
MCH: 31.8 pg (ref 26.0–34.0)
MCHC: 34.3 g/dL (ref 30.0–36.0)
MCV: 92.8 fL (ref 78.0–100.0)
Platelets: 102 10*3/uL — ABNORMAL LOW (ref 150–400)
RBC: 2.64 MIL/uL — AB (ref 3.87–5.11)
RDW: 16.4 % — ABNORMAL HIGH (ref 11.5–15.5)
WBC: 5.6 10*3/uL (ref 4.0–10.5)

## 2014-11-22 LAB — TYPE AND SCREEN
ABO/RH(D): A POS
ANTIBODY SCREEN: NEGATIVE
UNIT DIVISION: 0
Unit division: 0

## 2014-11-22 LAB — MRSA PCR SCREENING: MRSA BY PCR: NEGATIVE

## 2014-11-22 MED ORDER — MIRTAZAPINE 15 MG PO TABS
15.0000 mg | ORAL_TABLET | Freq: Every day | ORAL | Status: DC
  Administered 2014-11-22 – 2014-11-26 (×5): 15 mg via ORAL
  Filled 2014-11-22 (×10): qty 1

## 2014-11-22 MED ORDER — ALBUTEROL SULFATE (2.5 MG/3ML) 0.083% IN NEBU
2.5000 mg | INHALATION_SOLUTION | Freq: Four times a day (QID) | RESPIRATORY_TRACT | Status: DC | PRN
  Administered 2014-11-22 – 2014-11-24 (×3): 2.5 mg via RESPIRATORY_TRACT
  Filled 2014-11-22 (×3): qty 3

## 2014-11-22 MED ORDER — LEVOTHYROXINE SODIUM 50 MCG PO TABS
50.0000 ug | ORAL_TABLET | Freq: Every day | ORAL | Status: DC
  Administered 2014-11-22 – 2014-11-27 (×6): 50 ug via ORAL
  Filled 2014-11-22 (×7): qty 1

## 2014-11-22 MED ORDER — FAMOTIDINE 20 MG PO TABS
40.0000 mg | ORAL_TABLET | Freq: Every day | ORAL | Status: DC
  Administered 2014-11-22 – 2014-11-26 (×5): 40 mg via ORAL
  Filled 2014-11-22: qty 2
  Filled 2014-11-22: qty 1
  Filled 2014-11-22 (×4): qty 2

## 2014-11-22 MED ORDER — LORAZEPAM 2 MG/ML IJ SOLN
0.5000 mg | Freq: Once | INTRAMUSCULAR | Status: AC
  Administered 2014-11-22: 0.5 mg via INTRAVENOUS
  Filled 2014-11-22: qty 1

## 2014-11-22 MED ORDER — DULOXETINE HCL 30 MG PO CPEP
30.0000 mg | ORAL_CAPSULE | Freq: Every day | ORAL | Status: DC
  Administered 2014-11-22 – 2014-11-27 (×6): 30 mg via ORAL
  Filled 2014-11-22 (×8): qty 1

## 2014-11-22 MED ORDER — PANTOPRAZOLE SODIUM 40 MG PO TBEC
40.0000 mg | DELAYED_RELEASE_TABLET | Freq: Every day | ORAL | Status: DC
  Administered 2014-11-22 – 2014-11-27 (×5): 40 mg via ORAL
  Filled 2014-11-22 (×5): qty 1

## 2014-11-22 MED ORDER — PREDNISONE 5 MG PO TABS
5.0000 mg | ORAL_TABLET | Freq: Every day | ORAL | Status: DC
  Administered 2014-11-22 – 2014-11-27 (×6): 5 mg via ORAL
  Filled 2014-11-22 (×8): qty 1

## 2014-11-22 MED ORDER — BUDESONIDE 0.5 MG/2ML IN SUSP
0.5000 mg | Freq: Two times a day (BID) | RESPIRATORY_TRACT | Status: DC
  Administered 2014-11-22 – 2014-11-27 (×9): 0.5 mg via RESPIRATORY_TRACT
  Filled 2014-11-22 (×12): qty 2

## 2014-11-22 MED ORDER — ALPRAZOLAM 0.25 MG PO TABS
0.2500 mg | ORAL_TABLET | Freq: Every evening | ORAL | Status: DC | PRN
  Administered 2014-11-22 – 2014-11-25 (×4): 0.25 mg via ORAL
  Filled 2014-11-22 (×4): qty 1

## 2014-11-22 NOTE — Consult Note (Signed)
[facility] Consult: 8:17 AM 11/22/2014     Referring Provider: Dr Maudie Mercury Primary Care Physician: [name], MD Primary Gastroenterologist: Dr. Carlean Purl    Reason for Consultation: Anemia, macrocytic, FOBT +.   HPI: Donald Jacque Posas is a 81 y.o. female.  Hx diverticulitis with contained sigmoid perforation/abscess 2012, recurrent perforation leading to ex lap/sigmoid resection/end colostomy/gastrostomy 10/2011. c diff in 2012. Stage 3 CKD. Hx sarcoma. Hx breast cancer. COPD on 5 mg prednisone daily. Cardiac pacemaker in place.  Hx of b12 deficiency and IDA. S/p EGD/colonoscopy 2010 with ablation of gastric, duodenal, cecal AVMs. Also HP gastric polyp 2010. Takes 300 mg Ranitidine daily.  Followed by [name], hematologist. Last visit was 09/11/14, hgb 12.4 with plans to return to clinic in 08/2015. She did have c/o fatigue even then. Hx allergic reaction to iron dextran but no problem with Feraheme.   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 Hgb was 8.8. Submitted stool specimens and hemasure was + on 5/24.    Admitted for anemia 5/26 - 5/29.  Had pre admit epistaxis lasting 5 or 6 hours 5/24 and hgb 7.8 at hospital admit, 7.4 day 2. Hgb  9 post PRBCs x 2, 8.9 at discharge. MCV 103.Epistaxis treated with rhino rocket. FOBT was negative.   However, has as had issues with minor peristomal bleeding for at least a year; occurs when she removes stoma appliance adhesive. Stool itself is hard, occurs in ball formation, never has blood in stool. She requires nightly senekot and prn Miralax for successful elimination.  Ostomy RN note of 5/25 relays " red raised nodule to 3:00 o'clock on the outer stoma; suspect it is related to pressure from the inner 2 piece  ring of the pouching system..... Few scattered areas of medical adhesive related skin damage from adhesive. Red and moist with small amt bleeding"  Pt's severe COPD made EGD or any other endoscopic procedures high risk, do Dr Hilarie Fredrickson did not pursue.  Per Dr Lindi Adie, hematologist: "Severe anemia: I believe the cause of her anemia is multifactorial. I reviewed the blood work as well as reviewed her blood smear. Her reticulocyte count is not elevated. Haptoglobin from this admission is pending. N-56 and folic acid are normal. I do not believe the anemia is related to acute bleeding. Stool Hemoccults are negative. She may have underlying small amount of GI bleeding but it is unlikely the only cause for anemia. Send for iron studies. Anemia of chronic disease, factors include renal dysfunction, liver dysfunction although her liver function tests are normal. Versus myelodysplasia Given her general condition, a conservative approach is ideal. I agree with supporting her with blood transfusions and close clinical follow-up." However Heme did order ancillary tests "cold agglutinins and quantitative immunoglobulins (sometimes this may cause positive complement pos coombs test)"  Now returns to ED with fatigue, SOB and admitted with another "mild" bout of epistaxis, recurrent anemia/Hgb 7.5 and stool is now FOBT +. Hgb to 8.5 after 2 PRBCs Inpt GI consult requested.  Outpt ENT eval planned (  never seen but ENT last week) Pt had black stool on 5/30 and again yesterday.  Still tends to constipation.  Not sure if she had black stools during last week's admission.  Had small nose bleed yesterday AM.   Appetite is fair, she eats red meat. No abd pain, no n/v.  Her barking cough is currently resolved but still has significant SOB with minor activity along with fatigue.    Past Medical History  Diagnosis Date  . Insomnia, unspecified   . Osteoarthrosis, unspecified whether generalized or localized,  unspecified site   . Unspecified hypothyroidism   . Unspecified essential hypertension   . Atrial fibrillation   . Sciatica   . Anemia   . COPD (chronic obstructive pulmonary disease)   . UTI (urinary tract infection)   . GERD (gastroesophageal reflux disease)   . Pneumonia   . Asthma   . Clostridium difficile colitis   . Diverticulitis of sigmoid colon 11/2010    with abscess/contained perforation  . Dementia     mild  . Dehydration with hyponatremia 12/2010    hospitalized  . Angiodysplasia of stomach     ablated 2010  . Angiodysplasia of colon     ablated 2010  . Vitamin B12 deficiency (dietary) anemia 04/12/2011  . Shingles   . Sarcoma     right hand  . Malignant neoplasm of breast (female), unspecified site     left  . Dupuytren's disease     left hand  . Vitamin B12 deficiency 04/02/2012  . Complication of anesthesia     " very cautious because of my COPD"  . Pacemaker     Past Surgical History  Procedure Laterality Date  . Nasal sinus surgery      x 3  . Appendectomy    . Hernia repair    . Abdominal hysterectomy    . Pacemaker insertion    . Eye surgery      x6-bilateral  . Finger amputation      right  . Foot surgery      right toes x 3  . Tonsillectomy    . Colonoscopy  01/2009    w/ablation of 3 mm AVM, diverticulosis, external hemorrhoids  . Upper gastrointestinal endoscopy  01/2009    w/polypectomy, AVM, hyperplastic polyp  . Abdominal hysterectomy    . Laparotomy  11/12/2011    Procedure: EXPLORATORY LAPAROTOMY; Surgeon: Madilyn Hook, DO; Location: [facility]; Service: General; Laterality: N/A;  . Colostomy revision  11/12/2011    Procedure: COLON RESECTION SIGMOID; Surgeon: Madilyn Hook, DO; Location: [facility]; Service: General; Laterality: N/A;  . Colostomy   11/12/2011    Procedure: COLOSTOMY; Surgeon: Madilyn Hook, DO; Location: [facility]; Service: General; Laterality: Left; End colostomy  . Laparotomy  11/19/2011    Procedure: EXPLORATORY LAPAROTOMY; Surgeon: Shann Medal, MD; Location: [facility]; Service: General; Laterality: N/A;  . Gastrostomy  11/19/2011    Procedure: GASTROSTOMY; Surgeon: Shann Medal, MD; Location: [facility]; Service: General;; INSERTION GASTROSTOMY TUBE  . Application of wound vac  11/19/2011    Procedure: APPLICATION OF WOUND VAC; Surgeon: Shann Medal, MD; Location: [facility]; Service: General; Laterality: N/A;         Prior to Admission medications   Medication Sig Start Date End Date Taking? Authorizing Provider  acetaminophen (TYLENOL) 325 MG tablet Take 650 mg by mouth at bedtime.    Yes Historical Provider, MD  albuterol (PROVENTIL) (2.5 MG/3ML) 0.083% nebulizer solution Take 2.5 mg by nebulization  every 6 (six) hours as needed for wheezing or shortness of breath.   Yes Historical Provider, MD  ALPRAZolam (XANAX) 0.25 MG tablet Take 0.25 mg by mouth at bedtime. 11/18/14  Yes Historical Provider, MD  budesonide (RHINOCORT AQUA) 32 MCG/ACT nasal spray Place 2 sprays into both nostrils daily.   Yes Historical Provider, MD  Carboxymethylcellulose Sodium (LUBRICANT EYE DROPS OP) Apply 1 drop to eye daily.   Yes Historical Provider, MD  dexlansoprazole (DEXILANT) 60 MG capsule Take 60 mg by mouth daily.   Yes Historical Provider, MD  DULoxetine (CYMBALTA) 30 MG capsule Take 30 mg by mouth at bedtime. 10/28/14  Yes Historical Provider, MD  Eszopiclone 3 MG TABS Take 3 mg by mouth daily as needed (sleep).  10/28/14  Yes Historical Provider, MD  guaiFENesin-dextromethorphan (ROBITUSSIN DM) 100-10 MG/5ML syrup Take 15 mLs by mouth every 4 (four) hours as needed for cough.   Yes Historical Provider, MD  hydrocortisone cream 1 % Apply 1 application topically daily as needed for itching.   Yes  Historical Provider, MD  levothyroxine (SYNTHROID, LEVOTHROID) 50 MCG tablet Take 50 mcg by mouth daily. 09/22/14  Yes Historical Provider, MD  losartan (COZAAR) 50 MG tablet Take 50 mg by mouth daily. 10/25/14  Yes Historical Provider, MD  metoprolol succinate (TOPROL-XL) 50 MG 24 hr tablet Take 50 mg by mouth daily. 09/22/14  Yes Historical Provider, MD  mirtazapine (REMERON) 15 MG tablet Take 15 mg by mouth at bedtime.  09/16/14  Yes Historical Provider, MD  Multiple Vitamins-Minerals (MULTIVITAMIN & MINERAL PO) Take 1 tablet by mouth daily.   Yes Historical Provider, MD  mupirocin cream (BACTROBAN) 2 % Apply 1 application topically 2 (two) times daily.   Yes Historical Provider, MD  oxycodone (OXY-IR) 5 MG capsule Take 5 mg by mouth every 4 (four) hours as needed for pain.   Yes Historical Provider, MD  polyethylene glycol (MIRALAX / GLYCOLAX) packet Take 17 g by mouth daily as needed for mild constipation or moderate constipation.   Yes Historical Provider, MD  pravastatin (PRAVACHOL) 40 MG tablet Take 40 mg by mouth at bedtime.  10/26/14  Yes Historical Provider, MD  predniSONE (DELTASONE) 5 MG tablet Take 5 mg by mouth daily with breakfast.   Yes Historical Provider, MD  PROAIR HFA 108 (90 BASE) MCG/ACT inhaler Inhale 2 puffs into the lungs 2 (two) times daily.  11/11/14  Yes Historical Provider, MD  QVAR 80 MCG/ACT inhaler Inhale 3 puffs into the lungs 2 (two) times daily. 10/28/14  Yes Historical Provider, MD  ranitidine (ZANTAC) 300 MG tablet Take 300 mg by mouth at bedtime. 10/25/14  Yes Historical Provider, MD  traZODone (DESYREL) 50 MG tablet Take 50 mg by mouth at bedtime as needed for sleep.   Yes Historical Provider, MD  Vitamin D, Ergocalciferol, (DRISDOL) 50000 UNITS CAPS capsule Take 50,000 Units by mouth once a week. Every Sunday 10/25/14  Yes Historical Provider, MD  zafirlukast (ACCOLATE) 20 MG tablet Take 20 mg by mouth 2 (two) times daily. 09/22/14  Yes Historical Provider, MD    Scheduled  Meds: . sodium chloride   Intravenous Once  . pantoprazole (PROTONIX) IV  40 mg Intravenous Q12H  . sodium chloride  3 mL Intravenous Q12H  . sodium chloride  3 mL Intravenous Q12H   Infusions:   PRN Meds: sodium chloride, acetaminophen **OR** acetaminophen, sodium chloride  Allergies as of 11/11/2014 - Review Complete 11/11/2014  Allergen Reaction Noted  . Aspirin Shortness Of Breath 09/20/2007  .  Sulfonamide derivatives Hives   . Ibuprofen Other (See Comments) 09/20/2007  . Iron dextran Itching 01/08/2009  . Sulfa antibiotics Hives 12/17/2013  . Metoclopramide hcl Other (See Comments) 01/12/2009    Family History  Problem Relation Age of Onset  . Emphysema Paternal Uncle   . Heart disease Mother   . Colon cancer Mother   . Pancreatic cancer Father   . Cancer Father 29    pancreatic cancer  . Tuberculosis Paternal Uncle   . Breast cancer Maternal Aunt   . Breast cancer Cousin   . Prostate cancer Cousin   . Colon cancer Cousin   . Uterine cancer Cousin     History   Social History  . Marital Status: Widowed    Spouse Name: N/A  . Number of Children: 3  . Years of Education: N/A   Occupational History  . Home Maker    Social History Main Topics  . Smoking status: Former Smoker -- 0.50 packs/day for 21 years    Types: Cigarettes    Quit date: 06/20/1977  . Smokeless tobacco: Never Used  . Alcohol Use: Yes     Comment: 1 glass wine/night  . Drug Use: No  . Sexual Activity: Not on file   Other Topics Concern  . Not on file   Social History Narrative    REVIEW OF SYSTEMS: Constitutional: Per HPI ENT: No nose bleeds Pulm: Intermittent, chronic barking cough and SOB. If sputum produced, it is foamy white or clear. Hypoxic to 87% on RA with walking in hallway.  Breast: left with surgical scars.  CV: No  palpitations, no chest pain. Takes Furosemide once a week for Ankle swelling. Has issues with IV access, only has one in right hand.  GU: No hematuria, no frequency GI: Per HPI. No dysphagia. No rectal discharge or bleeding.  Heme: Per HPI  Transfusions: Per HPI Neuro: No headaches, no peripheral tingling or numbness Derm: No itching, no rash or sores.  Endocrine: No sweats or chills. No polyuria or dysuria Immunization: Not queried.  Travel: None beyond local counties in last few months.          PHYSICAL EXAM: Vital signs in last 24 hours: Filed Vitals:   11/22/14 0758  BP: 137/59  Pulse:   Temp: 98.8 F (37.1 C)  Resp: 19   Wt Readings from Last 3 Encounters:  11/22/14 178 lb 9.2 oz (81 kg)   General: looks unwell, pale.   Head: No facial assymetry, swelling or signs trauma  Eyes: EOMI. No conj pallor. No icterus.  Ears: Slightly HOH  Nose: no blood in noses Mouth: Clear, moist MM. No sores Neck: No mass, no TMG Lungs: Diminished throughout. No rales or weezing.dyspneic with speech, no cough.  Overall better than last week, still significant resp compromise.  Heart: RRR. No MRG Abdomen: Soft, NT, active BS. Ostomy bag on left with firm stool, it is black and 3+ FOBT+ . extensive lower ventral hernia with peristomal hernia.  Rectal: deferred  Musc/Skeltl: post surgical changes on right hand, one of the fingers is surgically absent. Left hand Dupuytren's contracture.  Extremities: No pedal or LE edema. Slight, non-pitting UE swelling.  Heme: Large purpura on upper right arm.  Neurologic: Oriented x 3.  Skin: Thin with purpura as above Tattoos: none Nodes: No cervical adenopathy  Psych: Anxious, obsessively detailed historian.   Intake/Output from previous day: 06/03 0701 - 06/04 0700 In: 1229.7 [Blood:1229.7] Out: 150 [Urine:150] Intake/Output this shift:  LAB RESULTS:  Recent Labs  11/21/14 1620  11/22/14 0513  WBC 6.8 5.6  HGB 7.5* 8.4*  HCT 22.7* 24.5*  PLT 136* 102*   BMET Lab Results  Component Value Date   NA 140 11/22/2014   NA 136 11/21/2014   K 3.6 11/22/2014   K 3.8 11/21/2014   CL 106 11/22/2014   CL 103 11/21/2014   CO2 28 11/22/2014   CO2 25 11/21/2014   GLUCOSE 94 11/22/2014   GLUCOSE 103* 11/21/2014   BUN 26* 11/22/2014   BUN 30* 11/21/2014   CREATININE 0.98 11/22/2014   CREATININE 1.17* 11/21/2014   CALCIUM 8.9 11/22/2014   CALCIUM 9.5 11/21/2014   LFT  Recent Labs  11/22/14 0513  PROT 4.8*  ALBUMIN 3.0*  AST 21  ALT 19  ALKPHOS 36*  BILITOT 1.6*   PT/INR Lab Results  Component Value Date   INR 1.13 11/22/2014   Hepatitis Panel No results for input(s): HEPBSAG, HCVAB, HEPAIGM, HEPBIGM in the last 72 hours.         IgG (Immunoglobin G), Serum 700 - 1600 mg/dL 456 (L)   IgA 64 - 422 mg/dL 35 (L)   IgM, Serum 26 - 217 mg/dL 73   Comments: (NOTE)           RADIOLOGY STUDIES: No results found.  ENDOSCOPIC STUDIES: 01/2009 EGD for iron def anemia ENDOSCOPIC IMPRESSION: 1) Av malformations ablated in the stomach (1) and duodenum (several). 2) Sessile polyp (path hyperplastic) in the antrum, removed. Site treated with hemoclip to prevent bleeding.  3) Otherwise normal examination  01/2009 Colonoscopy ENDOSCOPIC IMPRESSION: 1) 3 mm av malformation in the cecum (ablated with APC) 2) Moderate diverticulosis in the sigmoid colon 3) External hemorrhoids 4) Otherwise normal examination  IMPRESSION:   * Anemia, macrocytic. Hgb dropped up to 4 grams in last 2 months. B12, folate levels normal.  Previously iron deficient but currently iron levels ok.  Transfused last week in setting of severe epistaxis, anemia. Episode minor epistaxis yesterday.   Transfused again 2 units overnight    * FOBT +. Minor peristomal bleeding when she removes ostomy adhesive but no bloody stools or melena.  Suspect epistaxis  contributing.  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  Dark stool, FOBT +.  ? Old blood from epistaxis (pt tends to constipation) vs new GIB from AVMs or ulcer or liver disease related portal htn. .  *  New diagnosis of possible cirrhosis made last week based on her CT. Fatty liver is likely cause.   * Hx AVMs on EGD and colonoscopy in 2010. These may be a source of slow, chronic blood loss  * Epistaxis. recurrent.  More severe episode last week.   * COPD. Nocturnal oxygen.prednisone dependent.  Overall resp status better but still poor overall.  No longer has a cough.   *  anxiety    PLAN:     *  Switch to po Protonix.  Switch to po Famotidine in place of her usual Zantac.  If resp status allows, plan EGD with MAC on Monday 6/6.  Colonoscopy seems too high risk.  Let her eat until then.    Azucena Freed  11/22/2014, 8:17 AM Pager: [phone] Attending MD note:   I have taken a history, examined the patient, and reviewed the chart. I agree with the Advanced Practitioner's impression and recommendations. Long discussion with the opt and her daughter, she is describing black stools, which could  be a result of epistaxis as well as of  known AVM's or PUD. She is an extremely high risk for anesthesia and therefore we will plan to  Proceed with EGD only, under MAC on monday 11/24/2014 by Dr Fuller Plan. She is hemodynamically stable. As to possible colonoscopy, that will have be decided later on.  [facility] Pager # [phone]

## 2014-11-22 NOTE — Progress Notes (Addendum)
[place] TEAM 1 - Stepdown/ICU TEAM Progress Note  [name] FGH:[id number] DOB: February 10, 1934 DOA: 11/21/2014 PCP: No primary care provider on file.  Admit HPI / Brief Narrative: 81 yo female PMHx depression, sarcoma right hand, Dupuytrens Dz left hand, breast cancer, COPD on 5 mg prednisone daily, cardiac pacemaker in place, atrial fibrillation, anemia, diverticulosis with contained sigmoid perforation/abscess 2012, recurrent perforation leading to ex lap/sigmoid resection/end colostomy/gastrostomy 10/2011.C Diff in 2012., angiodysplasia of stomach, colon, vitamin B12 def , hypothyroidism  C/O  feeling weak, Pt was noted today to have epistaxis x1, mild. But was sent over due to Anemia on routine labs, hgb 6.8 per pt. Pt was noted to have hgb 7.5 Here in the ED. Pt admits to slight sob. In addition to fatigue. Pt will be admitted for transfusion as per ED request   HPI/Subjective: 6/4 A/O 4, negative N/V, negative abdominal pain. Patient has very poor vascular access. Patient also complains of epistaxis although on admission none apparent.  Assessment/Plan: GI bleed -Protonix 40 mg daily  Acute blood loss Anemia -6/3 Transfused 2 units PRBC  -GI plans to perform EGD on Monday 6/6, however currently believes colonoscopy to high of a risk.  Epistaxis: -Chronic problem address as outpatient with ENT  Copd -Albuterol nebulizers  QID PRN  -QVAR : Budesonide automatically substituted -Prednisone 5 mg daily  Essential HTN -BP currently controlled on no medication would hold BP medication in face of GI bleed.  Acute renal failure  -Resolved with hydration and blood products.    Depression -Cymbalta 30 mg daily -Remeron 15 mg QHS  Hypothyroidism -Synthroid 50 g daily -Obtain TSH   Code Status: DO NOT RESUSCITATE Family Communication: no family present at time of exam Disposition Plan: Per GI    Consultants: Dr. Carlean Purl (GI)   Procedure/Significant  Events: 6/4 Colonoscopy; 3 mm av malformation in the cecum (ablated with APC)-Moderate diverticulosis sigmoid colon-External hemorrhoids 6/3 Transfused 2 units PRBC   Culture NA  Antibiotics:   DVT prophylaxis: SCD   Devices    LINES / TUBES:      Continuous Infusions:   Objective: VITAL SIGNS: Temp: 98.7 F (37.1 C) (06/04 1204) Temp Source: Oral (06/04 1204) BP: 132/51 mmHg (06/04 1204) Pulse Rate: 80 (06/04 0439) SPO2; FIO2:   Intake/Output Summary (Last 24 hours) at 11/22/14 1401 Last data filed at 11/22/14 0329  Gross per 24 hour  Intake 1229.67 ml  Output    150 ml  Net 1079.67 ml     Exam: General: A/O 4, NAD, No acute respiratory distress Eyes: Negative headache, eye pain, double vision, negative retinal hemorrhage ENT: Negative Runny nose, negative ear pain, negative tinnitus, negative gingival bleeding,  Neck:  Negative scars, masses, torticollis, lymphadenopathy, JVD Lungs: Clear to auscultation bilaterally without wheezes or crackles Cardiovascular: Regular rate and rhythm without murmur gallop or rub normal S1 and S2 Abdomen:negative abdominal pain, negative dysphagia, Nontender, distended, with multiple surgical scars. bowel sounds positive, no rebound, no ascites, no appreciable mass Extremities: No significant cyanosis, clubbing, or edema bilateral lower extremities Psychiatric:  Negative depression, positive anxiety Neurologic:  Cranial nerves II through XII intact, tongue/uvula midline, all extremities muscle strength 5/5, sensation intact throughout, negative dysarthria, negative expressive aphasia, negative receptive aphasia.   Data Reviewed: Basic Metabolic Panel:  Recent Labs Lab 11/21/14 1620 11/22/14 0513  NA 136 140  K 3.8 3.6  CL 103 106  CO2 25 28  GLUCOSE 103* 94  BUN 30* 26*  CREATININE 1.17* 0.98  CALCIUM  9.5 8.9   Liver Function Tests:  Recent Labs Lab 11/22/14 0513  AST 21  ALT 19  ALKPHOS 36*  BILITOT  1.6*  PROT 4.8*  ALBUMIN 3.0*   No results for input(s): LIPASE, AMYLASE in the last 168 hours. No results for input(s): AMMONIA in the last 168 hours. CBC:  Recent Labs Lab 11/21/14 1620 11/22/14 0513  WBC 6.8 5.6  NEUTROABS 5.0  --   HGB 7.5* 8.4*  HCT 22.7* 24.5*  MCV 96.2 92.8  PLT 136* 102*   Cardiac Enzymes: No results for input(s): CKTOTAL, CKMB, CKMBINDEX, TROPONINI in the last 168 hours. BNP (last 3 results) No results for input(s): BNP in the last 8760 hours.  ProBNP (last 3 results) No results for input(s): PROBNP in the last 8760 hours.  CBG: No results for input(s): GLUCAP in the last 168 hours.  Recent Results (from the past 240 hour(s))  MRSA PCR Screening     Status: None   Collection Time: 11/22/14  4:00 AM  Result Value Ref Range Status   MRSA by PCR NEGATIVE NEGATIVE Final    Comment:        The GeneXpert MRSA Assay (FDA approved for NASAL specimens only), is one component of a comprehensive MRSA colonization surveillance program. It is not intended to diagnose MRSA infection nor to guide or monitor treatment for MRSA infections.      Studies:  Recent x-ray studies have been reviewed in detail by the Attending Physician  Scheduled Meds:  Scheduled Meds: . sodium chloride   Intravenous Once  . famotidine  40 mg Oral QHS  . pantoprazole  40 mg Oral Q0600  . sodium chloride  3 mL Intravenous Q12H  . sodium chloride  3 mL Intravenous Q12H    Time spent on care of this patient: 40 mins   WOODS, Geraldo Docker , MD  [facility] Office  [phone] Pager - [phone]  On-Call/Text Page:      [email or web address]      password [email or web address]  If 7PM-7AM, please contact night-coverage [email or web address] Password [email or web address] 11/22/2014, 2:01 PM     Care during the described time interval was provided by me .  I have reviewed this patient's available data, including medical history, events of note, physical examination, and all test results as part of my  evaluation. I have personally reviewed and interpreted all radiology studies.   Dia Crawford, MD [phone] Pager

## 2014-11-23 DIAGNOSIS — N179 Acute kidney failure, unspecified: Secondary | ICD-10-CM | POA: Diagnosis not present

## 2014-11-23 DIAGNOSIS — K922 Gastrointestinal hemorrhage, unspecified: Secondary | ICD-10-CM | POA: Diagnosis not present

## 2014-11-23 DIAGNOSIS — I10 Essential (primary) hypertension: Secondary | ICD-10-CM | POA: Diagnosis not present

## 2014-11-23 DIAGNOSIS — D62 Acute posthemorrhagic anemia: Secondary | ICD-10-CM | POA: Diagnosis not present

## 2014-11-23 LAB — CBC WITH DIFFERENTIAL/PLATELET
Basophils Absolute: 0 10*3/uL (ref 0.0–0.1)
Basophils Relative: 0 % (ref 0–1)
Eosinophils Absolute: 0.1 10*3/uL (ref 0.0–0.7)
Eosinophils Relative: 1 % (ref 0–5)
HEMATOCRIT: 26.5 % — AB (ref 36.0–46.0)
HEMOGLOBIN: 8.9 g/dL — AB (ref 12.0–15.0)
Lymphocytes Relative: 26 % (ref 12–46)
Lymphs Abs: 1.4 10*3/uL (ref 0.7–4.0)
MCH: 31.6 pg (ref 26.0–34.0)
MCHC: 33.6 g/dL (ref 30.0–36.0)
MCV: 94 fL (ref 78.0–100.0)
Monocytes Absolute: 0.4 10*3/uL (ref 0.1–1.0)
Monocytes Relative: 8 % (ref 3–12)
Neutro Abs: 3.6 10*3/uL (ref 1.7–7.7)
Neutrophils Relative %: 65 % (ref 43–77)
PLATELETS: 108 10*3/uL — AB (ref 150–400)
RBC: 2.82 MIL/uL — ABNORMAL LOW (ref 3.87–5.11)
RDW: 16.7 % — ABNORMAL HIGH (ref 11.5–15.5)
WBC: 5.5 10*3/uL (ref 4.0–10.5)

## 2014-11-23 LAB — TSH: TSH: 1.834 u[IU]/mL (ref 0.350–4.500)

## 2014-11-23 LAB — MAGNESIUM: Magnesium: 2 mg/dL (ref 1.7–2.4)

## 2014-11-23 MED ORDER — ALPRAZOLAM 0.25 MG PO TABS
0.2500 mg | ORAL_TABLET | Freq: Once | ORAL | Status: AC
  Administered 2014-11-23: 0.25 mg via ORAL
  Filled 2014-11-23: qty 1

## 2014-11-23 MED ORDER — HYDRALAZINE HCL 20 MG/ML IJ SOLN
10.0000 mg | Freq: Four times a day (QID) | INTRAMUSCULAR | Status: DC | PRN
  Administered 2014-11-23 – 2014-11-25 (×2): 10 mg via INTRAVENOUS
  Filled 2014-11-23 (×2): qty 1

## 2014-11-23 NOTE — Progress Notes (Signed)
Labs were never drawn this am because we thought pt would have the PICC line to get labs from.  Called lab and pt agrees to be stuck by lab.

## 2014-11-23 NOTE — Progress Notes (Signed)
Peripherally Inserted Central Catheter/Midline Placement  The IV Nurse has discussed with the patient and/or persons authorized to consent for the patient, the purpose of this procedure and the potential benefits and risks involved with this procedure.  The benefits include less needle sticks, lab draws from the catheter and patient may be discharged home with the catheter.  Risks include, but not limited to, infection, bleeding, blood clot (thrombus formation), and puncture of an artery; nerve damage and irregular heat beat.  Alternatives to this procedure were also discussed.   Able to access basilic vein without issue.  Able to advance PICC easily but unable to advance into SVC due to obstruction.  Repositioned multiple times, attempted guidewire, remains unable to drop into SVC.  Pt states has had this issue before.  Please refer to IR if PICC still desired. PICC/Midline Placement Documentation        [name] 11/23/2014, 9:26 AM

## 2014-11-23 NOTE — Progress Notes (Signed)
Daily Rounding Note  11/23/2014, 1:38 PM    SUBJECTIVE:       unsuccessful PICC attempt by RN today.  "Able to access basilic vein without issue. Able to advance PICC easily but unable to advance into SVC due to obstruction. Repositioned multiple times, attempted guidewire, remains unable to drop into SVC. Pt states has had this issue before. Please refer to IR if PICC still desired." Pt had refused the CBC at 0500, thought it could be drawn from the PICC once placed. , so no CBC yet today.  Now willing to have lab drawn. Still has functioning Periph IV in right hand. Breathing is "as good as it gets" Formed, hard, dark green/black stool today.   OBJECTIVE:         Vital signs in last 24 hours:    Temp:  [98.5 F (36.9 C)-98.9 F (37.2 C)] 98.5 F (36.9 C) (06/05 0553) Pulse Rate:  [67-89] 67 (06/05 0553) Resp:  [19-21] 19 (06/05 0553) BP: (118-163)/(44-86) 130/70 mmHg (06/05 0600) SpO2:  [95 %-96 %] 95 % (06/05 0553) Last BM Date: 11/22/14 Filed Weights   11/21/14 1601 11/21/14 2122 11/22/14 0439  Weight: 178 lb (80.74 kg) 178 lb 9.2 oz (81 kg) 178 lb 9.2 oz (81 kg)   General: pleasant, NAD.  comfortable   Heart: RRR Chest: reduced but clear.  Mild dyspnea with speech but much improved.  No cough.  Abdomen: soft, NT, + BS.  Ostomy on left  Extremities: no CCE,  + lots of purpura Neuro/Psych:  Pleasant, less anxious. No gross deficits.   Intake/Output from previous day:    Intake/Output this shift:    Lab Results:  Recent Labs  11/21/14 1620 11/22/14 0513  WBC 6.8 5.6  HGB 7.5* 8.4*  HCT 22.7* 24.5*  PLT 136* 102*   BMET  Recent Labs  11/21/14 1620 11/22/14 0513  NA 136 140  K 3.8 3.6  CL 103 106  CO2 25 28  GLUCOSE 103* 94  BUN 30* 26*  CREATININE 1.17* 0.98  CALCIUM 9.5 8.9   LFT  Recent Labs  11/22/14 0513  PROT 4.8*  ALBUMIN 3.0*  AST 21  ALT 19  ALKPHOS 36*  BILITOT 1.6*     PT/INR  Recent Labs  11/22/14 0513  LABPROT 14.7  INR 1.13   Hepatitis Panel No results for input(s): HEPBSAG, HCVAB, HEPAIGM, HEPBIGM in the last 72 hours.  Studies/Results: No results found.  ASSESMENT:   * Anemia, macrocytic. Hgb dropped up to 4 grams in last 2 months. B12, folate levels normal.  Previously iron deficient but currently iron levels ok.  Transfused last week in setting of severe epistaxis, anemia. Episode minor epistaxis yesterday.  Transfused again 2 units this admission.   * FOBT +. Minor peristomal bleeding when she removes ostomy adhesive but no bloody stools or melena.  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  Dark stool, FOBT +. ? Old blood from epistaxis (pt tends to constipation) vs new GIB from AVMs or ulcer or liver disease related portal htn. .  * New diagnosis of possible cirrhosis made last week based on her CT. Fatty liver is likely cause.   * Hx AVMs on EGD and colonoscopy in 2010. These may be a source of slow, chronic blood loss  * Epistaxis. recurrent. More severe episode last week.   * COPD. Nocturnal oxygen.prednisone dependent. Overall resp status better  but still poor overall. No longer has a cough.   * anxiety   PLAN   *  EGD with MAC sedation tomorrow.  Time to be determined.  *  ? Need for PICC?     Azucena Freed  11/23/2014, 1:38 PM Pager: [phone]' Attending MD note:   I have taken a history, , and reviewed the chart. I agree with the Advanced Practitioner's impression and recommendations. Severe anemia, for EGD/enteroscopy in am with Propofol  [facility] Pager # [phone]

## 2014-11-23 NOTE — Progress Notes (Signed)
Pt requested to not have AM labs drawn until PICC line placed. Pt requested AM medicine to be given at 6AM including synthroid so patient can sleep after medicine.

## 2014-11-23 NOTE — Progress Notes (Signed)
[place] TEAM 1 - Stepdown/ICU TEAM Progress Note  Xenia Nile VWU:[id number] DOB: 1934-06-17 DOA: 11/21/2014 PCP: No primary care provider on file.  Admit HPI / Brief Narrative: 81 yo female PMHx depression, sarcoma right hand, Dupuytrens Dz left hand, breast cancer, COPD on 5 mg prednisone daily, cardiac pacemaker in place, atrial fibrillation, anemia, diverticulosis with contained sigmoid perforation/abscess 2012, recurrent perforation leading to ex lap/sigmoid resection/end colostomy/gastrostomy 10/2011.C Diff in 2012., angiodysplasia of stomach, colon, vitamin B12 def , hypothyroidism  C/O  feeling weak, Pt was noted today to have epistaxis x1, mild. But was sent over due to Anemia on routine labs, hgb 6.8 per pt. Pt was noted to have hgb 7.5 Here in the ED. Pt admits to slight sob. In addition to fatigue. Pt will be admitted for transfusion as per ED request   HPI/Subjective: No distress, feels ok, eating, PICC couldn't be placed  Assessment/Plan: Anemia-multifactorial, complex GI history  -diverticulitis with contained sigmoid perforation/abscess 2012, recurrent perforation leading to ex lap/sigmoid resection/end colostomy/gastrostomy 10/2011 -S/p EGD/colonoscopy 2010 with ablation of gastric, duodenal, cecal AVMs. Also Hx of gastric polyp 2010. -also intermittent epistaxis -FOBT +. Minor peristomal bleeding  -s/p 2units PRBC 6/3, hb improved to 8.4, todays pending -plan for EGD with MAC tomorrow  -GI following, on Po PPI -if peripheral IV not sufficient for EGD tomorrow will need IR to place PICC  Epistaxis: -Chronic problem address as outpatient with ENT  Copd -stable -Albuterol nebulizers  QID PRN  -QVAR : Budesonide automatically substituted -Prednisone 5 mg daily  Essential HTN -BP currently controlled on no medication   Acute renal failure  -Resolved with hydration and blood products.   Depression -Cymbalta 30 mg daily -Remeron 15 mg  QHS  Hypothyroidism -Synthroid 50 g daily -Obtain TSH   Code Status: DO NOT RESUSCITATE Family Communication: no family present at time of exam Disposition Plan: Per GI    Consultants: Dr. Carlean Purl (GI)   Procedure/Significant Events: 6/4 Colonoscopy; 3 mm av malformation in the cecum (ablated with APC)-Moderate diverticulosis sigmoid colon-External hemorrhoids 6/3 Transfused 2 units PRBC   Culture NA  DVT prophylaxis: SCD Objective: VITAL SIGNS: Temp: 98.5 F (36.9 C) (06/05 0553) BP: 130/70 mmHg (06/05 0600) Pulse Rate: 67 (06/05 0553) SPO2; FIO2:   Intake/Output Summary (Last 24 hours) at 11/23/14 1114 Last data filed at 11/22/14 2300  Gross per 24 hour  Intake      0 ml  Output      0 ml  Net      0 ml     Exam: General: A/O 4, NAD, No acute respiratory distress Eyes: Negative headache, eye pain, double vision, negative retinal hemorrhage ENT: Negative Runny nose, negative ear pain, negative tinnitus, negative gingival bleeding,  Neck:  Negative scars, masses, torticollis, lymphadenopathy, JVD Lungs: Clear to auscultation bilaterally without wheezes or crackles Cardiovascular: Regular rate and rhythm without murmur gallop or rub normal S1 and S2 Abdomen:, Nontender, distended, with multiple surgical scars. bowel sounds positive, large ventral hernia, colostomy Extremities: No significant cyanosis, clubbing, or edema bilateral lower extremities Psychiatric:  Negative depression, positive anxiety Neurologic:  Cranial nerves II through XII intact, tongue/uvula midline, all extremities muscle strength 5/5, sensation intact throughout, negative dysarthria, negative expressive aphasia, negative receptive aphasia.   Data Reviewed: Basic Metabolic Panel:  Recent Labs Lab 11/21/14 1620 11/22/14 0513  NA 136 140  K 3.8 3.6  CL 103 106  CO2 25 28  GLUCOSE 103* 94  BUN 30* 26*  CREATININE 1.17*  0.98  CALCIUM 9.5 8.9   Liver Function Tests:  Recent  Labs Lab 11/22/14 0513  AST 21  ALT 19  ALKPHOS 36*  BILITOT 1.6*  PROT 4.8*  ALBUMIN 3.0*   No results for input(s): LIPASE, AMYLASE in the last 168 hours. No results for input(s): AMMONIA in the last 168 hours. CBC:  Recent Labs Lab 11/21/14 1620 11/22/14 0513  WBC 6.8 5.6  NEUTROABS 5.0  --   HGB 7.5* 8.4*  HCT 22.7* 24.5*  MCV 96.2 92.8  PLT 136* 102*   Cardiac Enzymes: No results for input(s): CKTOTAL, CKMB, CKMBINDEX, TROPONINI in the last 168 hours. BNP (last 3 results) No results for input(s): BNP in the last 8760 hours.  ProBNP (last 3 results) No results for input(s): PROBNP in the last 8760 hours.  CBG: No results for input(s): GLUCAP in the last 168 hours.  Recent Results (from the past 240 hour(s))  MRSA PCR Screening     Status: None   Collection Time: 11/22/14  4:00 AM  Result Value Ref Range Status   MRSA by PCR NEGATIVE NEGATIVE Final    Comment:        The GeneXpert MRSA Assay (FDA approved for NASAL specimens only), is one component of a comprehensive MRSA colonization surveillance program. It is not intended to diagnose MRSA infection nor to guide or monitor treatment for MRSA infections.    Scheduled Meds:  Scheduled Meds: . sodium chloride   Intravenous Once  . budesonide  0.5 mg Nebulization BID  . DULoxetine  30 mg Oral Daily  . famotidine  40 mg Oral QHS  . levothyroxine  50 mcg Oral QAC breakfast  . mirtazapine  15 mg Oral QHS  . pantoprazole  40 mg Oral Q0600  . predniSONE  5 mg Oral Q breakfast    Time spent on care of this patient: 40 mins   Domenic Polite , MD  [facility] Office  [phone] Pager - [phone] On-Call/Text Page:      [email or web address]      password [email or web address]  If 7PM-7AM, please contact night-coverage [email or web address] Password [email or web address] 11/23/2014, 11:14 AM

## 2014-11-24 ENCOUNTER — Observation Stay (HOSPITAL_COMMUNITY): Payer: Medicare Other | Admitting: Anesthesiology

## 2014-11-24 ENCOUNTER — Encounter (HOSPITAL_COMMUNITY)
Admission: EM | Disposition: A | Discharge status: Skilled Nursing Facility | Payer: Self-pay | Source: Home / Self Care | Attending: Emergency Medicine

## 2014-11-24 ENCOUNTER — Encounter (HOSPITAL_COMMUNITY): Payer: Self-pay | Admitting: Anesthesiology

## 2014-11-24 DIAGNOSIS — K449 Diaphragmatic hernia without obstruction or gangrene: Secondary | ICD-10-CM | POA: Diagnosis not present

## 2014-11-24 DIAGNOSIS — D649 Anemia, unspecified: Secondary | ICD-10-CM | POA: Insufficient documentation

## 2014-11-24 DIAGNOSIS — R195 Other fecal abnormalities: Secondary | ICD-10-CM | POA: Diagnosis not present

## 2014-11-24 DIAGNOSIS — D539 Nutritional anemia, unspecified: Secondary | ICD-10-CM | POA: Diagnosis not present

## 2014-11-24 DIAGNOSIS — D62 Acute posthemorrhagic anemia: Secondary | ICD-10-CM | POA: Diagnosis not present

## 2014-11-24 DIAGNOSIS — K2970 Gastritis, unspecified, without bleeding: Secondary | ICD-10-CM | POA: Diagnosis not present

## 2014-11-24 DIAGNOSIS — K3189 Other diseases of stomach and duodenum: Secondary | ICD-10-CM | POA: Diagnosis not present

## 2014-11-24 DIAGNOSIS — D132 Benign neoplasm of duodenum: Secondary | ICD-10-CM | POA: Diagnosis not present

## 2014-11-24 HISTORY — PX: ENTEROSCOPY: SHX5533

## 2014-11-24 LAB — BASIC METABOLIC PANEL
Anion gap: 10 (ref 5–15)
BUN: 23 mg/dL — AB (ref 6–20)
CO2: 23 mmol/L (ref 22–32)
CREATININE: 1.09 mg/dL — AB (ref 0.44–1.00)
Calcium: 8.9 mg/dL (ref 8.9–10.3)
Chloride: 105 mmol/L (ref 101–111)
GFR calc Af Amer: 55 mL/min — ABNORMAL LOW (ref >60)
GFR, EST NON AFRICAN AMERICAN: 47 mL/min — AB (ref >60)
Glucose, Bld: 93 mg/dL (ref 65–99)
POTASSIUM: 3.6 mmol/L (ref 3.5–5.1)
SODIUM: 138 mmol/L (ref 135–145)

## 2014-11-24 LAB — CBC
HCT: 24 % — ABNORMAL LOW (ref 36.0–46.0)
Hemoglobin: 8.4 g/dL — ABNORMAL LOW (ref 12.0–15.0)
MCH: 32.3 pg (ref 26.0–34.0)
MCHC: 35 g/dL (ref 30.0–36.0)
MCV: 92.3 fL (ref 78.0–100.0)
PLATELETS: 98 10*3/uL — AB (ref 150–400)
RBC: 2.6 MIL/uL — ABNORMAL LOW (ref 3.87–5.11)
RDW: 16.5 % — AB (ref 11.5–15.5)
WBC: 6.1 10*3/uL (ref 4.0–10.5)

## 2014-11-24 SURGERY — EGD (ESOPHAGOGASTRODUODENOSCOPY)
Anesthesia: Monitor Anesthesia Care

## 2014-11-24 SURGERY — ENTEROSCOPY
Anesthesia: Monitor Anesthesia Care

## 2014-11-24 MED ORDER — PROPOFOL INFUSION 10 MG/ML OPTIME
INTRAVENOUS | Status: DC | PRN
  Administered 2014-11-24: 100 ug/kg/min via INTRAVENOUS

## 2014-11-24 MED ORDER — PHENYLEPHRINE HCL 10 MG/ML IJ SOLN
INTRAMUSCULAR | Status: DC | PRN
  Administered 2014-11-24: 80 ug via INTRAVENOUS

## 2014-11-24 MED ORDER — LACTATED RINGERS IV SOLN
INTRAVENOUS | Status: DC | PRN
  Administered 2014-11-24: 10:00:00 via INTRAVENOUS

## 2014-11-24 MED ORDER — GLUCAGON HCL RDNA (DIAGNOSTIC) 1 MG IJ SOLR
INTRAMUSCULAR | Status: DC | PRN
  Administered 2014-11-24: .5 mg via INTRAVENOUS

## 2014-11-24 MED ORDER — POLYETHYLENE GLYCOL 3350 17 G PO PACK
17.0000 g | PACK | Freq: Every day | ORAL | Status: DC
  Administered 2014-11-24 – 2014-11-27 (×4): 17 g via ORAL
  Filled 2014-11-24 (×4): qty 1

## 2014-11-24 NOTE — Addendum Note (Signed)
Addendum  created 11/24/14 1104 by Kyung Rudd, CRNA   Modules edited: Anesthesia Events, Anesthesia Flowsheet, Narrator   Narrator:  Narrator: Event Log Edited

## 2014-11-24 NOTE — Anesthesia Preprocedure Evaluation (Addendum)
Anesthesia Evaluation  Patient identified by MRN, date of birth, ID band Patient awake    Reviewed: Allergy & Precautions, NPO status , Patient's Chart, lab work & pertinent test results  History of Anesthesia Complications (+) PONVNegative for: history of anesthetic complications  Airway Mallampati: I       Dental  (+) Teeth Intact   Pulmonary COPD COPD inhaler, former smoker,    Pulmonary exam normal       Cardiovascular hypertension, Pt. on medications Rhythm:Irregular Rate:Normal     Neuro/Psych PSYCHIATRIC DISORDERS Depression negative neurological ROS     GI/Hepatic Neg liver ROS,   Endo/Other  Hypothyroidism   Renal/GU negative Renal ROS     Musculoskeletal   Abdominal   Peds  Hematology   Anesthesia Other Findings   Reproductive/Obstetrics                           Anesthesia Physical Anesthesia Plan  ASA: III  Anesthesia Plan: MAC   Post-op Pain Management:    Induction:   Airway Management Planned: Natural Airway and Nasal Cannula  Additional Equipment:   Intra-op Plan:   Post-operative Plan:   Informed Consent: I have reviewed the patients History and Physical, chart, labs and discussed the procedure including the risks, benefits and alternatives for the proposed anesthesia with the patient or authorized representative who has indicated his/her understanding and acceptance.   Dental advisory given  Plan Discussed with: CRNA and Anesthesiologist  Anesthesia Plan Comments:       Anesthesia Quick Evaluation

## 2014-11-24 NOTE — H&P (View-Only) (Signed)
[facility] Consult: 8:17 AM 11/22/2014     Referring Provider: Dr Maudie Mercury Primary Care Physician: [name], MD Primary Gastroenterologist: Dr. Carlean Purl    Reason for Consultation: Anemia, macrocytic, FOBT +.   HPI: [name] is a 81 y.o. female.  Hx diverticulitis with contained sigmoid perforation/abscess 2012, recurrent perforation leading to ex lap/sigmoid resection/end colostomy/gastrostomy 10/2011. c diff in 2012. Stage 3 CKD. Hx sarcoma. Hx breast cancer. COPD on 5 mg prednisone daily. Cardiac pacemaker in place.  Hx of b12 deficiency and IDA. S/p EGD/colonoscopy 2010 with ablation of gastric, duodenal, cecal AVMs. Also HP gastric polyp 2010. Takes 300 mg Ranitidine daily.  Followed by [name], hematologist. Last visit was 09/11/14, hgb 12.4 with plans to return to clinic in 08/2015. She did have c/o fatigue even then. Hx allergic reaction to iron dextran but no problem with Feraheme.   Work in at [employer] office 2 weeks ago for ongoing fatigue, generalized weakness. Hgb was 8.8. Submitted stool specimens and hemasure was + on 5/24.    Admitted for anemia 5/26 - 5/29.  Had pre admit epistaxis lasting 5 or 6 hours 5/24 and hgb 7.8 at hospital admit, 7.4 day 2. Hgb  9 post PRBCs x 2, 8.9 at discharge. MCV 103.Epistaxis treated with rhino rocket. FOBT was negative.   However, has as had issues with minor peristomal bleeding for at least a year; occurs when she removes stoma appliance adhesive. Stool itself is hard, occurs in ball formation, never has blood in stool. She requires nightly senekot and prn Miralax for successful elimination.  Ostomy RN note of 5/25 relays " red raised nodule to 3:00 o'clock on the outer stoma; suspect it is related to pressure from the inner 2 piece  ring of the pouching system..... Few scattered areas of medical adhesive related skin damage from adhesive. Red and moist with small amt bleeding"  Pt's severe COPD made EGD or any other endoscopic procedures high risk, do Dr Hilarie Fredrickson did not pursue.  Per Dr Lindi Adie, hematologist: "Severe anemia: I believe the cause of her anemia is multifactorial. I reviewed the blood work as well as reviewed her blood smear. Her reticulocyte count is not elevated. Haptoglobin from this admission is pending. R-71 and folic acid are normal. I do not believe the anemia is related to acute bleeding. Stool Hemoccults are negative. She may have underlying small amount of GI bleeding but it is unlikely the only cause for anemia. Send for iron studies. Anemia of chronic disease, factors include renal dysfunction, liver dysfunction although her liver function tests are normal. Versus myelodysplasia Given her general condition, a conservative approach is ideal. I agree with supporting her with blood transfusions and close clinical follow-up." However Heme did order ancillary tests "cold agglutinins and quantitative immunoglobulins (sometimes this may cause positive complement pos coombs test)"  Now returns to ED with fatigue, SOB and admitted with another "mild" bout of epistaxis, recurrent anemia/Hgb 7.5 and stool is now FOBT +. Hgb to 8.5 after 2 PRBCs Inpt GI consult requested.  Outpt ENT eval planned (  never seen but ENT last week) Pt had black stool on 5/30 and again yesterday.  Still tends to constipation.  Not sure if she had black stools during last week's admission.  Had small nose bleed yesterday AM.   Appetite is fair, she eats red meat. No abd pain, no n/v.  Her barking cough is currently resolved but still has significant SOB with minor activity along with fatigue.    Past Medical History  Diagnosis Date  . Insomnia, unspecified   . Osteoarthrosis, unspecified whether generalized or localized,  unspecified site   . Unspecified hypothyroidism   . Unspecified essential hypertension   . Atrial fibrillation   . Sciatica   . Anemia   . COPD (chronic obstructive pulmonary disease)   . UTI (urinary tract infection)   . GERD (gastroesophageal reflux disease)   . Pneumonia   . Asthma   . Clostridium difficile colitis   . Diverticulitis of sigmoid colon 11/2010    with abscess/contained perforation  . Dementia     mild  . Dehydration with hyponatremia 12/2010    hospitalized  . Angiodysplasia of stomach     ablated 2010  . Angiodysplasia of colon     ablated 2010  . Vitamin B12 deficiency (dietary) anemia 04/12/2011  . Shingles   . Sarcoma     right hand  . Malignant neoplasm of breast (female), unspecified site     left  . Dupuytren's disease     left hand  . Vitamin B12 deficiency 04/02/2012  . Complication of anesthesia     " very cautious because of my COPD"  . Pacemaker     Past Surgical History  Procedure Laterality Date  . Nasal sinus surgery      x 3  . Appendectomy    . Hernia repair    . Abdominal hysterectomy    . Pacemaker insertion    . Eye surgery      x6-bilateral  . Finger amputation      right  . Foot surgery      right toes x 3  . Tonsillectomy    . Colonoscopy  01/2009    w/ablation of 3 mm AVM, diverticulosis, external hemorrhoids  . Upper gastrointestinal endoscopy  01/2009    w/polypectomy, AVM, hyperplastic polyp  . Abdominal hysterectomy    . Laparotomy  11/12/2011    Procedure: EXPLORATORY LAPAROTOMY; Surgeon: Madilyn Hook, DO; Location: [facility]; Service: General; Laterality: N/A;  . Colostomy revision  11/12/2011    Procedure: COLON RESECTION SIGMOID; Surgeon: Madilyn Hook, DO; Location: [facility]; Service: General; Laterality: N/A;  . Colostomy   11/12/2011    Procedure: COLOSTOMY; Surgeon: Madilyn Hook, DO; Location: [facility]; Service: General; Laterality: Left; End colostomy  . Laparotomy  11/19/2011    Procedure: EXPLORATORY LAPAROTOMY; Surgeon: Shann Medal, MD; Location: [facility]; Service: General; Laterality: N/A;  . Gastrostomy  11/19/2011    Procedure: GASTROSTOMY; Surgeon: Shann Medal, MD; Location: [facility]; Service: General;; INSERTION GASTROSTOMY TUBE  . Application of wound vac  11/19/2011    Procedure: APPLICATION OF WOUND VAC; Surgeon: Shann Medal, MD; Location: [facility]; Service: General; Laterality: N/A;         Prior to Admission medications   Medication Sig Start Date End Date Taking? Authorizing Provider  acetaminophen (TYLENOL) 325 MG tablet Take 650 mg by mouth at bedtime.    Yes Historical Provider, MD  albuterol (PROVENTIL) (2.5 MG/3ML) 0.083% nebulizer solution Take 2.5 mg by nebulization  every 6 (six) hours as needed for wheezing or shortness of breath.   Yes Historical Provider, MD  ALPRAZolam (XANAX) 0.25 MG tablet Take 0.25 mg by mouth at bedtime. 11/18/14  Yes Historical Provider, MD  budesonide (RHINOCORT AQUA) 32 MCG/ACT nasal spray Place 2 sprays into both nostrils daily.   Yes Historical Provider, MD  Carboxymethylcellulose Sodium (LUBRICANT EYE DROPS OP) Apply 1 drop to eye daily.   Yes Historical Provider, MD  dexlansoprazole (DEXILANT) 60 MG capsule Take 60 mg by mouth daily.   Yes Historical Provider, MD  DULoxetine (CYMBALTA) 30 MG capsule Take 30 mg by mouth at bedtime. 10/28/14  Yes Historical Provider, MD  Eszopiclone 3 MG TABS Take 3 mg by mouth daily as needed (sleep).  10/28/14  Yes Historical Provider, MD  guaiFENesin-dextromethorphan (ROBITUSSIN DM) 100-10 MG/5ML syrup Take 15 mLs by mouth every 4 (four) hours as needed for cough.   Yes Historical Provider, MD  hydrocortisone cream 1 % Apply 1 application topically daily as needed for itching.   Yes  Historical Provider, MD  levothyroxine (SYNTHROID, LEVOTHROID) 50 MCG tablet Take 50 mcg by mouth daily. 09/22/14  Yes Historical Provider, MD  losartan (COZAAR) 50 MG tablet Take 50 mg by mouth daily. 10/25/14  Yes Historical Provider, MD  metoprolol succinate (TOPROL-XL) 50 MG 24 hr tablet Take 50 mg by mouth daily. 09/22/14  Yes Historical Provider, MD  mirtazapine (REMERON) 15 MG tablet Take 15 mg by mouth at bedtime.  09/16/14  Yes Historical Provider, MD  Multiple Vitamins-Minerals (MULTIVITAMIN & MINERAL PO) Take 1 tablet by mouth daily.   Yes Historical Provider, MD  mupirocin cream (BACTROBAN) 2 % Apply 1 application topically 2 (two) times daily.   Yes Historical Provider, MD  oxycodone (OXY-IR) 5 MG capsule Take 5 mg by mouth every 4 (four) hours as needed for pain.   Yes Historical Provider, MD  polyethylene glycol (MIRALAX / GLYCOLAX) packet Take 17 g by mouth daily as needed for mild constipation or moderate constipation.   Yes Historical Provider, MD  pravastatin (PRAVACHOL) 40 MG tablet Take 40 mg by mouth at bedtime.  10/26/14  Yes Historical Provider, MD  predniSONE (DELTASONE) 5 MG tablet Take 5 mg by mouth daily with breakfast.   Yes Historical Provider, MD  PROAIR HFA 108 (90 BASE) MCG/ACT inhaler Inhale 2 puffs into the lungs 2 (two) times daily.  11/11/14  Yes Historical Provider, MD  QVAR 80 MCG/ACT inhaler Inhale 3 puffs into the lungs 2 (two) times daily. 10/28/14  Yes Historical Provider, MD  ranitidine (ZANTAC) 300 MG tablet Take 300 mg by mouth at bedtime. 10/25/14  Yes Historical Provider, MD  traZODone (DESYREL) 50 MG tablet Take 50 mg by mouth at bedtime as needed for sleep.   Yes Historical Provider, MD  Vitamin D, Ergocalciferol, (DRISDOL) 50000 UNITS CAPS capsule Take 50,000 Units by mouth once a week. Every Sunday 10/25/14  Yes Historical Provider, MD  zafirlukast (ACCOLATE) 20 MG tablet Take 20 mg by mouth 2 (two) times daily. 09/22/14  Yes Historical Provider, MD    Scheduled  Meds: . sodium chloride   Intravenous Once  . pantoprazole (PROTONIX) IV  40 mg Intravenous Q12H  . sodium chloride  3 mL Intravenous Q12H  . sodium chloride  3 mL Intravenous Q12H   Infusions:   PRN Meds: sodium chloride, acetaminophen **OR** acetaminophen, sodium chloride  Allergies as of 11/11/2014 - Review Complete 11/11/2014  Allergen Reaction Noted  . Aspirin Shortness Of Breath 09/20/2007  .  Sulfonamide derivatives Hives   . Ibuprofen Other (See Comments) 09/20/2007  . Iron dextran Itching 01/08/2009  . Sulfa antibiotics Hives 12/17/2013  . Metoclopramide hcl Other (See Comments) 01/12/2009    Family History  Problem Relation Age of Onset  . Emphysema Paternal Uncle   . Heart disease Mother   . Colon cancer Mother   . Pancreatic cancer Father   . Cancer Father 30    pancreatic cancer  . Tuberculosis Paternal Uncle   . Breast cancer Maternal Aunt   . Breast cancer Cousin   . Prostate cancer Cousin   . Colon cancer Cousin   . Uterine cancer Cousin     History   Social History  . Marital Status: Widowed    Spouse Name: N/A  . Number of Children: 3  . Years of Education: N/A   Occupational History  . Home Maker    Social History Main Topics  . Smoking status: Former Smoker -- 0.50 packs/day for 21 years    Types: Cigarettes    Quit date: 06/20/1977  . Smokeless tobacco: Never Used  . Alcohol Use: Yes     Comment: 1 glass wine/night  . Drug Use: No  . Sexual Activity: Not on file   Other Topics Concern  . Not on file   Social History Narrative    REVIEW OF SYSTEMS: Constitutional: Per HPI ENT: No nose bleeds Pulm: Intermittent, chronic barking cough and SOB. If sputum produced, it is foamy white or clear. Hypoxic to 87% on RA with walking in hallway.  Breast: left with surgical scars.  CV: No  palpitations, no chest pain. Takes Furosemide once a week for Ankle swelling. Has issues with IV access, only has one in right hand.  GU: No hematuria, no frequency GI: Per HPI. No dysphagia. No rectal discharge or bleeding.  Heme: Per HPI  Transfusions: Per HPI Neuro: No headaches, no peripheral tingling or numbness Derm: No itching, no rash or sores.  Endocrine: No sweats or chills. No polyuria or dysuria Immunization: Not queried.  Travel: None beyond local counties in last few months.          PHYSICAL EXAM: Vital signs in last 24 hours: Filed Vitals:   11/22/14 0758  BP: 137/59  Pulse:   Temp: 98.8 F (37.1 C)  Resp: 19   Wt Readings from Last 3 Encounters:  11/22/14 178 lb 9.2 oz (81 kg)   General: looks unwell, pale.   Head: No facial assymetry, swelling or signs trauma  Eyes: EOMI. No conj pallor. No icterus.  Ears: Slightly HOH  Nose: no blood in noses Mouth: Clear, moist MM. No sores Neck: No mass, no TMG Lungs: Diminished throughout. No rales or weezing.dyspneic with speech, no cough.  Overall better than last week, still significant resp compromise.  Heart: RRR. No MRG Abdomen: Soft, NT, active BS. Ostomy bag on left with firm stool, it is black and 3+ FOBT+ . extensive lower ventral hernia with peristomal hernia.  Rectal: deferred  Musc/Skeltl: post surgical changes on right hand, one of the fingers is surgically absent. Left hand Dupuytren's contracture.  Extremities: No pedal or LE edema. Slight, non-pitting UE swelling.  Heme: Large purpura on upper right arm.  Neurologic: Oriented x 3.  Skin: Thin with purpura as above Tattoos: none Nodes: No cervical adenopathy  Psych: Anxious, obsessively detailed historian.   Intake/Output from previous day: 06/03 0701 - 06/04 0700 In: 1229.7 [Blood:1229.7] Out: 150 [Urine:150] Intake/Output this shift:  LAB RESULTS:  Recent Labs  11/21/14 1620  11/22/14 0513  WBC 6.8 5.6  HGB 7.5* 8.4*  HCT 22.7* 24.5*  PLT 136* 102*   BMET Lab Results  Component Value Date   NA 140 11/22/2014   NA 136 11/21/2014   K 3.6 11/22/2014   K 3.8 11/21/2014   CL 106 11/22/2014   CL 103 11/21/2014   CO2 28 11/22/2014   CO2 25 11/21/2014   GLUCOSE 94 11/22/2014   GLUCOSE 103* 11/21/2014   BUN 26* 11/22/2014   BUN 30* 11/21/2014   CREATININE 0.98 11/22/2014   CREATININE 1.17* 11/21/2014   CALCIUM 8.9 11/22/2014   CALCIUM 9.5 11/21/2014   LFT  Recent Labs  11/22/14 0513  PROT 4.8*  ALBUMIN 3.0*  AST 21  ALT 19  ALKPHOS 36*  BILITOT 1.6*   PT/INR Lab Results  Component Value Date   INR 1.13 11/22/2014   Hepatitis Panel No results for input(s): HEPBSAG, HCVAB, HEPAIGM, HEPBIGM in the last 72 hours.         IgG (Immunoglobin G), Serum 700 - 1600 mg/dL 456 (L)   IgA 64 - 422 mg/dL 35 (L)   IgM, Serum 26 - 217 mg/dL 73   Comments: (NOTE)           RADIOLOGY STUDIES: No results found.  ENDOSCOPIC STUDIES: 01/2009 EGD for iron def anemia ENDOSCOPIC IMPRESSION: 1) Av malformations ablated in the stomach (1) and duodenum (several). 2) Sessile polyp (path hyperplastic) in the antrum, removed. Site treated with hemoclip to prevent bleeding.  3) Otherwise normal examination  01/2009 Colonoscopy ENDOSCOPIC IMPRESSION: 1) 3 mm av malformation in the cecum (ablated with APC) 2) Moderate diverticulosis in the sigmoid colon 3) External hemorrhoids 4) Otherwise normal examination  IMPRESSION:   * Anemia, macrocytic. Hgb dropped up to 4 grams in last 2 months. B12, folate levels normal.  Previously iron deficient but currently iron levels ok.  Transfused last week in setting of severe epistaxis, anemia. Episode minor epistaxis yesterday.   Transfused again 2 units overnight    * FOBT +. Minor peristomal bleeding when she removes ostomy adhesive but no bloody stools or melena.  Suspect epistaxis  contributing.  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  Dark stool, FOBT +.  ? Old blood from epistaxis (pt tends to constipation) vs new GIB from AVMs or ulcer or liver disease related portal htn. .  *  New diagnosis of possible cirrhosis made last week based on her CT. Fatty liver is likely cause.   * Hx AVMs on EGD and colonoscopy in 2010. These may be a source of slow, chronic blood loss  * Epistaxis. recurrent.  More severe episode last week.   * COPD. Nocturnal oxygen.prednisone dependent.  Overall resp status better but still poor overall.  No longer has a cough.   *  anxiety    PLAN:     *  Switch to po Protonix.  Switch to po Famotidine in place of her usual Zantac.  If resp status allows, plan EGD with MAC on Monday 6/6.  Colonoscopy seems too high risk.  Let her eat until then.    Azucena Freed  11/22/2014, 8:17 AM Pager: [phone] Attending MD note:   I have taken a history, examined the patient, and reviewed the chart. I agree with the Advanced Practitioner's impression and recommendations. Long discussion with the opt and her daughter, she is describing black stools, which could  be a result of epistaxis as well as of  known AVM's or PUD. She is an extremely high risk for anesthesia and therefore we will plan to  Proceed with EGD only, under MAC on monday 11/24/2014 by Dr Fuller Plan. She is hemodynamically stable. As to possible colonoscopy, that will have be decided later on.  [facility] Pager # [phone]

## 2014-11-24 NOTE — Anesthesia Postprocedure Evaluation (Signed)
Anesthesia Post Note  Patient: [name]  Procedure(s) Performed: Procedure(s) (LRB): ENTEROSCOPY (N/A)  Anesthesia type: MAC  Patient location: PACU  Post pain: Pain level controlled  Post assessment: Patient's Cardiovascular Status Stable  Last Vitals:  Filed Vitals:   11/24/14 1030  BP: 142/79  Pulse: 88  Temp:   Resp: 26    Post vital signs: Reviewed and stable  Level of consciousness: sedated  Complications: No apparent anesthesia complications

## 2014-11-24 NOTE — Progress Notes (Signed)
Pt for EGD with consent, NPO midnight, no s/s of distress noted.

## 2014-11-24 NOTE — Anesthesia Procedure Notes (Addendum)
Procedure Name: MAC Date/Time: 11/24/2014 9:53 AM Performed by: Kyung Rudd Pre-anesthesia Checklist: Patient identified, Emergency Drugs available, Suction available, Patient being monitored and Timeout performed Patient Re-evaluated:Patient Re-evaluated prior to inductionOxygen Delivery Method: Nasal cannula Preoxygenation: Pre-oxygenation with 100% oxygen Intubation Type: IV induction Placement Confirmation: positive ETCO2 Dental Injury: Teeth and Oropharynx as per pre-operative assessment

## 2014-11-24 NOTE — Progress Notes (Addendum)
[facility] TEAM 1 Transfer Progress Note  [name] KYH:[id number] DOB: 1934-02-29 DOA: 11/21/2014 PCP: [name], MD   Team 1 Transfer 6/6:  Admit HPI / Brief Narrative: 81 yo female PMHx depression, sarcoma right hand, Dupuytrens Dz left hand, breast cancer, COPD on 5 mg prednisone daily, cardiac pacemaker in place, atrial fibrillation, anemia, diverticulosis with contained sigmoid perforation/abscess 2012, recurrent perforation leading to ex lap/sigmoid resection/end colostomy/gastrostomy 10/2011.C Diff in 2012., angiodysplasia of stomach, colon, vitamin B12 def , hypothyroidism  C/O  feeling weak, Pt was noted today to have epistaxis x1, mild. But was sent over due to Anemia on routine labs, hgb 6.8 per pt. Pt was noted to have hgb 7.5 Here in the ED. Pt admits to slight sob. In addition to fatigue. Pt will be admitted for transfusion as per ED request   HPI/Subjective: Back from endoscopy, some dyspnea improving  Assessment/Plan: Anemia-multifactorial, complex GI history  -diverticulitis with contained sigmoid perforation/abscess 2012, recurrent perforation leading to ex lap/sigmoid resection/end colostomy/gastrostomy 10/2011 -S/p EGD/colonoscopy 2010 with ablation of gastric, duodenal, cecal AVMs. Also Hx of gastric polyp 2010. -also intermittent epistaxis -FOBT +. Minor peristomal bleeding  -s/p 2units PRBC 6/3, hb improved to 8.4, todays pending -6/6:  EGD : Half circumferential polypoid mass in the 3rd part of the duodenum; multiple biopsies performed,  Arteriovenous malformation measuring 49mm in the 2nd part of the duodenum -FU biopsy -GI following -hb stable,  Epistaxis: -Chronic problem address as outpatient with ENT  COPD -stable -Albuterol nebulizers  QID PRN  -QVAR : Budesonide automatically substituted -Prednisone 5 mg daily  Essential HTN -BP currently controlled on no medication   Acute renal failure  -Resolved with hydration and blood  products.   Depression -Cymbalta 30 mg daily -Remeron 15 mg QHS  Hypothyroidism -Synthroid 50 g daily -Obtain TSH  DVT proph: SCDs  Code Status: DO NOT RESUSCITATE Family Communication: no family present at time of exam Disposition Plan: Per GI  Consultants: Dr. Carlean Purl (GI)  Procedure/Significant Events: EGD: 6/6: Half circumferential polypoid mass in the 3rd part of the duodenum; multiple biopsies performed,  Arteriovenous malformation measuring 33mm in the 2nd part of the duodenum  DVT prophylaxis: SCD Objective: VITAL SIGNS: Temp: 98.3 F (36.8 C) (06/06 1338) Temp Source: Oral (06/06 1338) BP: 135/79 mmHg (06/06 1338) Pulse Rate: 94 (06/06 1338) SPO2; FIO2:   Intake/Output Summary (Last 24 hours) at 11/24/14 1818 Last data filed at 11/24/14 1630  Gross per 24 hour  Intake 1108.17 ml  Output    100 ml  Net 1008.17 ml     Exam: General: A/O 4, NAD, No acute respiratory distress Eyes: Negative headache, eye pain, double vision, negative retinal hemorrhage ENT: Negative Runny nose, negative ear pain, negative tinnitus, negative gingival bleeding,  Neck:  Negative scars, masses, torticollis, lymphadenopathy, JVD Lungs: Clear to auscultation bilaterally without wheezes or crackles Cardiovascular: Regular rate and rhythm without murmur gallop or rub normal S1 and S2 Abdomen:, Nontender, distended, with multiple surgical scars. bowel sounds positive, large ventral hernia, colostomy Extremities: No significant cyanosis, clubbing, or edema bilateral lower extremities Psychiatric:  Negative depression, positive anxiety Neurologic:  Cranial nerves II through XII intact, tongue/uvula midline, all extremities muscle strength 5/5, sensation intact throughout, negative dysarthria, negative expressive aphasia, negative receptive aphasia.   Data Reviewed: Basic Metabolic Panel:  Recent Labs Lab 11/21/14 1620 11/22/14 0513 11/23/14 1502 11/24/14 0422  NA 136 140   --  138  K 3.8 3.6  --  3.6  CL  103 106  --  105  CO2 25 28  --  23  GLUCOSE 103* 94  --  93  BUN 30* 26*  --  23*  CREATININE 1.17* 0.98  --  1.09*  CALCIUM 9.5 8.9  --  8.9  MG  --   --  2.0  --    Liver Function Tests:  Recent Labs Lab 11/22/14 0513  AST 21  ALT 19  ALKPHOS 36*  BILITOT 1.6*  PROT 4.8*  ALBUMIN 3.0*   No results for input(s): LIPASE, AMYLASE in the last 168 hours. No results for input(s): AMMONIA in the last 168 hours. CBC:  Recent Labs Lab 11/21/14 1620 11/22/14 0513 11/23/14 1848 11/24/14 0422  WBC 6.8 5.6 5.5 6.1  NEUTROABS 5.0  --  3.6  --   HGB 7.5* 8.4* 8.9* 8.4*  HCT 22.7* 24.5* 26.5* 24.0*  MCV 96.2 92.8 94.0 92.3  PLT 136* 102* 108* 98*   Cardiac Enzymes: No results for input(s): CKTOTAL, CKMB, CKMBINDEX, TROPONINI in the last 168 hours. BNP (last 3 results) No results for input(s): BNP in the last 8760 hours.  ProBNP (last 3 results) No results for input(s): PROBNP in the last 8760 hours.  CBG: No results for input(s): GLUCAP in the last 168 hours.  Recent Results (from the past 240 hour(s))  MRSA PCR Screening     Status: None   Collection Time: 11/22/14  4:00 AM  Result Value Ref Range Status   MRSA by PCR NEGATIVE NEGATIVE Final    Comment:        The GeneXpert MRSA Assay (FDA approved for NASAL specimens only), is one component of a comprehensive MRSA colonization surveillance program. It is not intended to diagnose MRSA infection nor to guide or monitor treatment for MRSA infections.    Scheduled Meds:  Scheduled Meds: . budesonide  0.5 mg Nebulization BID  . DULoxetine  30 mg Oral Daily  . famotidine  40 mg Oral QHS  . levothyroxine  50 mcg Oral QAC breakfast  . mirtazapine  15 mg Oral QHS  . pantoprazole  40 mg Oral Q0600  . polyethylene glycol  17 g Oral Daily  . predniSONE  5 mg Oral Q breakfast    Time spent on care of this patient: 40 mins   Domenic Polite , MD  [facility] Office   [phone] Pager - [phone] On-Call/Text Page:      [email or web address]      password [email or web address]  If 7PM-7AM, please contact night-coverage [email or web address] Password [email or web address] 11/24/2014, 6:18 PM

## 2014-11-24 NOTE — Op Note (Addendum)
[facility] [place], [id number]   ENTEROSCOPY with biopsies PROCEDURE REPORT     EXAM DATE: 11/24/2014   PATIENT NAME:      [name]           MR #:      [id number] BIRTHDATE:       [birth year]      VISIT #:     [id number]  ATTENDING:     Ladene Artist, MD, Marval Regal     STATUS:     inpatient  ASSISTANT:      Cletis Athens and Dustin Flock REFERRING MD: Prince Solian, M.D. ASA CLASS:        Class III  INDICATIONS:  The patient is a 81 yr old female here for an enteroscopy procedure due to hemoccult positive stools and anemia.  PROCEDURE PERFORMED:     Small bowel enteroscopy with biopsy  MEDICATIONS:     Monitored anesthesia care, Per Anesthesia , and Glucagon 0.5 mg IV  CONSENT: The patient understands the risks and benefits of the procedure and understands that these risks include, but are not limited to: sedation, allergic reaction, infection, perforation and/or bleeding. Alternative means of evaluation and treatment include, among others: physical exam, x-rays, and/or surgical intervention. The patient elects to proceed with this endoscopic procedure.  DESCRIPTION OF PROCEDURE: During intra-op preparation period all mechanical & medical equipment was checked for proper function. Hand hygiene and appropriate measures for infection prevention was taken. After the risks, benefits and alternatives of the procedure were thoroughly explained, Informed consent was verified, confirmed and timeout was successfully executed by the treatment team. The    endoscope was introduced through the mouth and advanced to the proximal jejunum jejunum. The prep was The overall prep quality was excellent.. The instrument was then slowly withdrawn while examining the mucosa circumferentially. The scope was then completely withdrawn from the patient and the procedure terminated. The pulse, BP, and O2 saturation were monitored and documented  by the physician and the nursing staff throughout the entire procedure.  The patient was cared for as planned according to standard protocol, then discharged to recovery in stable condition and with appropriate post procedure care. Estimated blood loss is zero unless otherwise noted in this procedure report.  DUODENUM: A half circumferential polypoid mass, measuring 2.5 X 2cm in size, with friable surfaces was found in the 3rd part of the duodenum.  Multiple biopsies were performed. It was not amenable to endoscopic removed due to its location, size and wide base.  A non bleeding arteriovenous malformation measuring 69mm in size was found in the 2nd part of the duodenum. JEJUNUM: The exam showed no abnormalities in the proximal jejunum.  STOMACH: There was mild gastritis in the gastric fundus and gastric body.   The stomach otherwise appeared normal.   A small hiatal hernia was noted on retroflexed view. ESOPHAGUS: The mucosa of the esophagus appeared normal.    ADVERSE EVENTS:      There were no immediate complications.  IMPRESSIONS:     1.  Half circumferential polypoid mass in the 3rd part of the duodenum; multiple biopsies performed 2.  Arteriovenous malformation measuring 31mm in the 2nd part of the duodenum 3.  Mild gastritis in the gastric fundus and gastric body 4.  Small hiatal hernia  RECOMMENDATIONS:     1. Await pathology results. The duodenal lesion was not noted on recent abd/pelvic CT scan     [name]  Fuller Plan, MD, Marval Regal eSigned:  Ladene Artist, MD, [facility] 11/24/2014 3:40 PM Revised: 11/24/2014 3:40 PM  cc:  Zenovia Jarred MD     PATIENT NAME:  [name], [name] MR#: [id number]

## 2014-11-24 NOTE — Transfer of Care (Signed)
Immediate Anesthesia Transfer of Care Note  Patient: [name]  Procedure(s) Performed: Procedure(s): ENTEROSCOPY (N/A)  Patient Location: Endoscopy Unit  Anesthesia Type:MAC  Level of Consciousness: awake, alert  and oriented  Airway & Oxygen Therapy: Patient Spontanous Breathing and Patient connected to nasal cannula oxygen  Post-op Assessment: Report given to RN, Post -op Vital signs reviewed and stable and Patient moving all extremities  Post vital signs: Reviewed and stable  Last Vitals:  Filed Vitals:   11/24/14 1027  BP: 171/58  Pulse: 94  Temp:   Resp: 22    Complications: No apparent anesthesia complications

## 2014-11-24 NOTE — Interval H&P Note (Signed)
History and Physical Interval Note:  11/24/2014 8:53 AM  [name]  has presented today for surgery, with the diagnosis of anemia, FOBT  dark stool.  The various methods of treatment have been discussed with the patient and family. After consideration of risks, benefits and other options for treatment, the patient has consented to  Procedure(s): ENTEROSCOPY (N/A) as a surgical intervention .  The patient's history has been reviewed, patient examined, no change in status, stable for surgery.  I have reviewed the patient's chart and labs.  Questions were answered to the patient's satisfaction.     [name]. Fuller Plan MD

## 2014-11-25 ENCOUNTER — Encounter (HOSPITAL_COMMUNITY): Payer: Self-pay | Admitting: Gastroenterology

## 2014-11-25 DIAGNOSIS — K3189 Other diseases of stomach and duodenum: Secondary | ICD-10-CM | POA: Diagnosis not present

## 2014-11-25 DIAGNOSIS — D62 Acute posthemorrhagic anemia: Secondary | ICD-10-CM | POA: Diagnosis not present

## 2014-11-25 DIAGNOSIS — K922 Gastrointestinal hemorrhage, unspecified: Secondary | ICD-10-CM | POA: Diagnosis not present

## 2014-11-25 DIAGNOSIS — R195 Other fecal abnormalities: Secondary | ICD-10-CM | POA: Diagnosis not present

## 2014-11-25 LAB — CBC
HCT: 24.9 % — ABNORMAL LOW (ref 36.0–46.0)
HEMOGLOBIN: 8.4 g/dL — AB (ref 12.0–15.0)
MCH: 31.7 pg (ref 26.0–34.0)
MCHC: 33.7 g/dL (ref 30.0–36.0)
MCV: 94 fL (ref 78.0–100.0)
Platelets: 95 10*3/uL — ABNORMAL LOW (ref 150–400)
RBC: 2.65 MIL/uL — ABNORMAL LOW (ref 3.87–5.11)
RDW: 16.4 % — ABNORMAL HIGH (ref 11.5–15.5)
WBC: 5.5 10*3/uL (ref 4.0–10.5)

## 2014-11-25 LAB — BASIC METABOLIC PANEL
Anion gap: 8 (ref 5–15)
BUN: 14 mg/dL (ref 6–20)
CHLORIDE: 103 mmol/L (ref 101–111)
CO2: 25 mmol/L (ref 22–32)
Calcium: 9.2 mg/dL (ref 8.9–10.3)
Creatinine, Ser: 1.09 mg/dL — ABNORMAL HIGH (ref 0.44–1.00)
GFR, EST AFRICAN AMERICAN: 55 mL/min — AB (ref >60)
GFR, EST NON AFRICAN AMERICAN: 47 mL/min — AB (ref >60)
Glucose, Bld: 129 mg/dL — ABNORMAL HIGH (ref 65–99)
Potassium: 3.7 mmol/L (ref 3.5–5.1)
SODIUM: 136 mmol/L (ref 135–145)

## 2014-11-25 MED ORDER — FUROSEMIDE 40 MG PO TABS
40.0000 mg | ORAL_TABLET | Freq: Every day | ORAL | Status: DC
  Administered 2014-11-25 – 2014-11-27 (×2): 40 mg via ORAL
  Filled 2014-11-25 (×3): qty 1

## 2014-11-25 MED ORDER — SENNOSIDES-DOCUSATE SODIUM 8.6-50 MG PO TABS
1.0000 | ORAL_TABLET | Freq: Every day | ORAL | Status: DC
  Administered 2014-11-25 – 2014-11-26 (×2): 1 via ORAL
  Filled 2014-11-25 (×2): qty 1

## 2014-11-25 MED ORDER — POTASSIUM CHLORIDE CRYS ER 20 MEQ PO TBCR
40.0000 meq | EXTENDED_RELEASE_TABLET | Freq: Every day | ORAL | Status: DC
  Administered 2014-11-25 – 2014-11-27 (×3): 40 meq via ORAL
  Filled 2014-11-25 (×3): qty 2

## 2014-11-25 NOTE — Consult Note (Signed)
[name] 09/23/1933  [id number].   Requesting MD: Dr. Lucio Edward Chief Complaint/Reason for Consult: duodenal mass HPI: this is a 81 yo white female with multiple medical problems (please see merging chart for full list) who underwent ex lap with Hartman's in 2013 with subsequent dehiscence and placement of stratus mesh and g-tube (has been removed).  This took many months to heal.  She also has chronic bronchitis/COPD for which she wears 2L of O2 at night.  She was noted to have anemia that was new a couple of weeks ago.  She was around 10 was admitted for transfusion and then DC home.  She then returned a couple of days later after having an epistaxis and bled to 7.5.  She was readmitted.  She then noted some melanotic stool in her colostomy bag.  Upon readmission GI was consulted for endoscopic evaluation.  This revealed a friable lesion in the 3rd portion of her duodenum.  It was biopsied.  We have been asked to evaluate her for further recommendations.   ROS : Please see HPI, otherwise negative  History reviewed. No pertinent family history.  Past Medical History  Diagnosis Date  . COPD (chronic obstructive pulmonary disease)   . Asthma   . Presence of permanent cardiac pacemaker   . Hypertension   . Hypothyroidism   . Anxiety   . GERD (gastroesophageal reflux disease)   . Anemia   . Cancer     breast; finger  . PONV (postoperative nausea and vomiting)     Past Surgical History  Procedure Laterality Date  . Breast surgery      left breast mammosite surgery  . Insert / replace / remove pacemaker    . Appendectomy    . Abdominal hysterectomy    . Tonsillectomy    . Enteroscopy N/A 11/24/2014    Procedure: ENTEROSCOPY;  Surgeon: Ladene Artist, MD;  Location: [facility] ENDOSCOPY;  Service: Endoscopy;  Laterality: N/A;  . Colectomy with colostomy creation/hartmann procedure  2013  . Abdominal wound dehiscence      stratus mesh placed with g-tube    Social History:  reports that  she has quit smoking. She does not have any smokeless tobacco history on file. She reports that she drinks about 4.2 oz of alcohol per week. She reports that she does not use illicit drugs.  Allergies:  Allergies  Allergen Reactions  . Aspirin   . Sulfa Antibiotics     Medications Prior to Admission  Medication Sig Dispense Refill  . acetaminophen (TYLENOL) 325 MG tablet Take 650 mg by mouth at bedtime.     [name] albuterol (PROVENTIL) (2.5 MG/3ML) 0.083% nebulizer solution Take 2.5 mg by nebulization every 6 (six) hours as needed for wheezing or shortness of breath.    . ALPRAZolam (XANAX) 0.25 MG tablet Take 0.25 mg by mouth at bedtime.    . budesonide (RHINOCORT AQUA) 32 MCG/ACT nasal spray Place 2 sprays into both nostrils daily.    . Carboxymethylcellulose Sodium (LUBRICANT EYE DROPS OP) Apply 1 drop to eye daily.    [name] dexlansoprazole (DEXILANT) 60 MG capsule Take 60 mg by mouth daily.    . DULoxetine (CYMBALTA) 30 MG capsule Take 30 mg by mouth at bedtime.  3  . Eszopiclone 3 MG TABS Take 3 mg by mouth daily as needed (sleep).   2  . guaiFENesin-dextromethorphan (ROBITUSSIN DM) 100-10 MG/5ML syrup Take 15 mLs by mouth every 4 (four) hours as needed for cough.    [name]  hydrocortisone cream 1 % Apply 1 application topically daily as needed for itching.    . levothyroxine (SYNTHROID, LEVOTHROID) 50 MCG tablet Take 50 mcg by mouth daily.  3  . losartan (COZAAR) 50 MG tablet Take 50 mg by mouth daily.  3  . metoprolol succinate (TOPROL-XL) 50 MG 24 hr tablet Take 50 mg by mouth daily.  1  . mirtazapine (REMERON) 15 MG tablet Take 15 mg by mouth at bedtime.   3  . Multiple Vitamins-Minerals (MULTIVITAMIN & MINERAL PO) Take 1 tablet by mouth daily.    . mupirocin cream (BACTROBAN) 2 % Apply 1 application topically 2 (two) times daily.    [name] oxycodone (OXY-IR) 5 MG capsule Take 5 mg by mouth every 4 (four) hours as needed for pain.    . polyethylene glycol (MIRALAX / GLYCOLAX) packet Take 17 g by  mouth daily as needed for mild constipation or moderate constipation.    . pravastatin (PRAVACHOL) 40 MG tablet Take 40 mg by mouth at bedtime.   2  . predniSONE (DELTASONE) 5 MG tablet Take 5 mg by mouth daily with breakfast.    . PROAIR HFA 108 (90 BASE) MCG/ACT inhaler Inhale 2 puffs into the lungs 2 (two) times daily.     [name] QVAR 80 MCG/ACT inhaler Inhale 3 puffs into the lungs 2 (two) times daily.  2  . ranitidine (ZANTAC) 300 MG tablet Take 300 mg by mouth at bedtime.  3  . traZODone (DESYREL) 50 MG tablet Take 50 mg by mouth at bedtime as needed for sleep.    . Vitamin D, Ergocalciferol, (DRISDOL) 50000 UNITS CAPS capsule Take 50,000 Units by mouth once a week. Every Sunday  3  . zafirlukast (ACCOLATE) 20 MG tablet Take 20 mg by mouth 2 (two) times daily.  3    Blood pressure 161/65, pulse 94, temperature 98.8 F (37.1 C), temperature source Oral, resp. rate 21, height 5' 4.5" (1.638 m), weight 85.911 kg (189 lb 6.4 oz), SpO2 99 %. Physical Exam: General: pleasant, obese white female who is laying in bed in NAD HEENT: head is normocephalic, atraumatic.  Sclera are noninjected.  PERRL.  Ears and nose without any masses or lesions.  Mouth is pink and moist Heart: regular, rate, and rhythm.  Normal s1,s2. No obvious murmurs, gallops, or rubs noted.  Palpable radial and pedal pulses bilaterally Lungs: CTAB, no wheezes, rhonchi, or rales noted.  Respiratory effort nonlabored, pacemaker present in chest Abd: soft, NT, ND, +BS, no masses or organomegaly, ostomy noted in LLQ.  She has loss of domain essentially from her prior wound dehiscence with large very wide midline scar. MS: all 4 extremities are symmetrical with no cyanosis, clubbing, or edema, Skin: warm and dry with no masses, lesions, or rashes, but multiple ecchymoses Psych: A&Ox3 with an appropriate affect.    Results for orders placed or performed during the hospital encounter of 11/21/14 (from the past 48 hour(s))  TSH      Status: None   Collection Time: 11/23/14  3:02 PM  Result Value Ref Range   TSH 1.834 0.350 - 4.500 uIU/mL  Magnesium     Status: None   Collection Time: 11/23/14  3:02 PM  Result Value Ref Range   Magnesium 2.0 1.7 - 2.4 mg/dL  CBC with Differential/Platelet     Status: Abnormal   Collection Time: 11/23/14  6:48 PM  Result Value Ref Range   WBC 5.5 4.0 - 10.5 K/uL   RBC 2.82 (L)  3.87 - 5.11 MIL/uL   Hemoglobin 8.9 (L) 12.0 - 15.0 g/dL   HCT 26.5 (L) 36.0 - 46.0 %   MCV 94.0 78.0 - 100.0 fL   MCH 31.6 26.0 - 34.0 pg   MCHC 33.6 30.0 - 36.0 g/dL   RDW 16.7 (H) 11.5 - 15.5 %   Platelets 108 (L) 150 - 400 K/uL    Comment: CONSISTENT WITH PREVIOUS RESULT   Neutrophils Relative % 65 43 - 77 %   Lymphocytes Relative 26 12 - 46 %   Monocytes Relative 8 3 - 12 %   Eosinophils Relative 1 0 - 5 %   Basophils Relative 0 0 - 1 %   Neutro Abs 3.6 1.7 - 7.7 K/uL   Lymphs Abs 1.4 0.7 - 4.0 K/uL   Monocytes Absolute 0.4 0.1 - 1.0 K/uL   Eosinophils Absolute 0.1 0.0 - 0.7 K/uL   Basophils Absolute 0.0 0.0 - 0.1 K/uL  CBC     Status: Abnormal   Collection Time: 11/24/14  4:22 AM  Result Value Ref Range   WBC 6.1 4.0 - 10.5 K/uL   RBC 2.60 (L) 3.87 - 5.11 MIL/uL   Hemoglobin 8.4 (L) 12.0 - 15.0 g/dL   HCT 24.0 (L) 36.0 - 46.0 %   MCV 92.3 78.0 - 100.0 fL   MCH 32.3 26.0 - 34.0 pg   MCHC 35.0 30.0 - 36.0 g/dL   RDW 16.5 (H) 11.5 - 15.5 %   Platelets 98 (L) 150 - 400 K/uL    Comment: CONSISTENT WITH PREVIOUS RESULT  Basic metabolic panel     Status: Abnormal   Collection Time: 11/24/14  4:22 AM  Result Value Ref Range   Sodium 138 135 - 145 mmol/L   Potassium 3.6 3.5 - 5.1 mmol/L   Chloride 105 101 - 111 mmol/L   CO2 23 22 - 32 mmol/L   Glucose, Bld 93 65 - 99 mg/dL   BUN 23 (H) 6 - 20 mg/dL   Creatinine, Ser 1.09 (H) 0.44 - 1.00 mg/dL   Calcium 8.9 8.9 - 10.3 mg/dL   GFR calc non Af Amer 47 (L) >60 mL/min   GFR calc Af Amer 55 (L) >60 mL/min    Comment: (NOTE) The eGFR has been  calculated using the CKD EPI equation. This calculation has not been validated in all clinical situations. eGFR's persistently <60 mL/min signify possible Chronic Kidney Disease.    Anion gap 10 5 - 15   No results found.     Assessment/Plan 1. Duodenal mass 2. GI bleed 3. H/o epistaxis 4. ABL anemia 5. COPD, 2L O2 at night 6. complex PSH for abdomen 7. A fib, has pacemaker (no anticoagulation 8. On chronic prednisone (83m daily)  Plan: 1. The patient has a mass in her duodenum that appears friable and a possible source of her melanotic stools.  She had also just had a bad epistaxis, so some of her melena could have come from her nose bleed, as she has never had melena that she knows of before this.  Given the patient's surgical history, she would be very difficult to operate on, never mind the difficulty of the location in D3.  Await pathology from her biopsies.  Patient is very reluctant to have an operation as she was told prior that another operation would kill her.  I will need to discuss this case with Dr. WRedmond Pullingand likely Dr. BBarry Dienesas well.  We will follow.   Fernie Grimm E  11/25/2014, 9:33 AM Pager: [phone]

## 2014-11-25 NOTE — Evaluation (Signed)
Physical Therapy Evaluation Patient Details Name: [name] MRN: [id number] DOB: Dec 27, 1933 Today's Date: 11/25/2014   History of Present Illness  Patient is a 81 y/o female with PMH of depression, sarcoma right hand, Dupuytrens Dz left hand, breast cancer, COPD on 5 mg prednisone daily, cardiac pacemaker in place, atrial fibrillation, anemia, diverticulosis with contained sigmoid perforation/abscess 2012, recurrent perforation leading to ex lap/sigmoid resection/end colostomy/gastrostomy 10/2011.C Diff in 2012., angiodysplasia of stomach, colon, vitamin B12 def , hypothyroidism admitted for weakess and anemia s/p sp 2 Units PRBC; EGD with friable duodenal polypoid mass, path pending    Clinical Impression  Patient presents with generalized weakness, fatigue and dyspnea on exertion with drop in oxygen saturation with activity impacting mobility. Sa02 dropped to 80% on RA during ambulation Reviewed pursed lip breathing. Pt mostly Mod I with mobility at ALF. Would benefit from skilled PT and HHPT to improve endurance and overall safe mobility.    Follow Up Recommendations Home health PT;Supervision - Intermittent    Equipment Recommendations  None recommended by PT    Recommendations for Other Services OT consult     Precautions / Restrictions Restrictions Weight Bearing Restrictions: No      Mobility  Bed Mobility               General bed mobility comments: Sitting in chair upon PT arrival.   Transfers Overall transfer level: Needs assistance Equipment used: Rolling walker (2 wheeled) Transfers: Sit to/from Stand Sit to Stand: Supervision         General transfer comment: Supervision for safety.   Ambulation/Gait Ambulation/Gait assistance: Supervision Ambulation Distance (Feet): 150 Feet Assistive device: Rolling walker (2 wheeled) Gait Pattern/deviations: Step-through pattern;Decreased stride length;Trunk flexed   Gait velocity interpretation: Below normal speed  for age/gender General Gait Details: Slow, steady gait. Short standing rest breaks for dyspnea. Sa02 dropped to 80% on RA. Able to recover with pursed lip breathing in a few minutes. "I feel prickly all over."  Stairs            Wheelchair Mobility    Modified Rankin (Stroke Patients Only)       Balance Overall balance assessment: Needs assistance Sitting-balance support: Feet supported;No upper extremity supported Sitting balance-Leahy Scale: Good     Standing balance support: During functional activity Standing balance-Leahy Scale: Fair                               Pertinent Vitals/Pain Pain Assessment: No/denies pain    Home Living Family/patient expects to be discharged to:: Assisted living               Home Equipment: Walker - 4 wheels;Wheelchair - power      Prior Function Level of Independence: Independent with assistive device(s)         Comments: Pt using rollator for ambulation and power w/c as needed for longer distances. Wears 02 at night. goes to dining hall for meals.      Hand Dominance        Extremity/Trunk Assessment   Upper Extremity Assessment: Defer to OT evaluation           Lower Extremity Assessment: Generalized weakness         Communication   Communication: No difficulties  Cognition Arousal/Alertness: Awake/alert Behavior During Therapy: WFL for tasks assessed/performed Overall Cognitive Status: Within Functional Limits for tasks assessed  General Comments      Exercises        Assessment/Plan    PT Assessment Patient needs continued PT services  PT Diagnosis Generalized weakness;Difficulty walking   PT Problem List Decreased strength;Cardiopulmonary status limiting activity;Decreased activity tolerance;Decreased mobility  PT Treatment Interventions Balance training;Gait training;Functional mobility training;Therapeutic activities;Therapeutic  exercise;Patient/family education   PT Goals (Current goals can be found in the Care Plan section) Acute Rehab PT Goals Patient Stated Goal: to get stronger PT Goal Formulation: With patient Time For Goal Achievement: 12/09/14 Potential to Achieve Goals: Good    Frequency Min 3X/week   Barriers to discharge Decreased caregiver support Pt lives alone in ALF    Co-evaluation               End of Session Equipment Utilized During Treatment: Gait belt Activity Tolerance: Other (comment);Patient limited by fatigue (drop in oxygen saturation.) Patient left: in chair;with call bell/phone within reach Nurse Communication: Mobility status    Functional Assessment Tool Used: CLinical judgment Functional Limitation: Mobility: Walking and moving around Mobility: Walking and Moving Around Current Status (P5916): At least 1 percent but less than 20 percent impaired, limited or restricted Mobility: Walking and Moving Around Goal Status [phone]): At least 1 percent but less than 20 percent impaired, limited or restricted    Time: 1202-1230 PT Time Calculation (min) (ACUTE ONLY): 28 min   Charges:   PT Evaluation $Initial PT Evaluation Tier I: 1 Procedure PT Treatments $Gait Training: 8-22 mins   PT G Codes:   PT G-Codes **NOT FOR INPATIENT CLASS** Functional Assessment Tool Used: CLinical judgment Functional Limitation: Mobility: Walking and moving around Mobility: Walking and Moving Around Current Status (Z9935): At least 1 percent but less than 20 percent impaired, limited or restricted Mobility: Walking and Moving Around Goal Status [phone]): At least 1 percent but less than 20 percent impaired, limited or restricted    [place] 11/25/2014, 1:15 PM Wray Kearns, [place], DPT [phone]

## 2014-11-25 NOTE — Progress Notes (Signed)
[name] ZOX:[id number] DOB: 10-28-1933 DOA: 11/21/2014 PCP: [name], MD   Team 1 Transfer 6/6:  Admit HPI / Brief Narrative: 81 yo female PMHx depression, sarcoma right hand, Dupuytrens Dz left hand, breast cancer, COPD on 5 mg prednisone daily, cardiac pacemaker in place, atrial fibrillation, anemia, diverticulosis with contained sigmoid perforation/abscess 2012, recurrent perforation leading to ex lap/sigmoid resection/end colostomy/gastrostomy 10/2011.C Diff in 2012., angiodysplasia of stomach, colon, vitamin B12 def , hypothyroidism  C/O  feeling weak, Pt was noted  6/3 to have epistaxis x1, mild. But was sent over due to Anemia on routine labs, hgb 6.8 per pt. Pt was noted to have hgb 7.5 in the ED.   Seen by GI, sp 2 Units PRBC, EGD with friable duodenal polypoid mass, path pending CCS consulting  HPI/Subjective: Complains of swelling in L arm and legs, breathing ok, asking about Port  Assessment/Plan: Anemia-multifactorial, complex GI history  -diverticulitis with contained sigmoid perforation/abscess 2012, recurrent perforation leading to ex lap/sigmoid resection/end colostomy/gastrostomy 10/2011 -S/p EGD/colonoscopy 2010 with ablation of gastric, duodenal, cecal AVMs. Also Hx of gastric polyp 2010. -also intermittent epistaxis -FOBT +. Minor peristomal bleeding  -s/p 2units PRBC 6/3, hb improved to 8.4, Hb stable since  -6/6:  EGD : Half circumferential polypoid mass in the 3rd part of the duodenum; multiple biopsies performed,  Arteriovenous malformation measuring 77mm in the 2nd part of the duodenum -FU biopsy, CCS following now  Chronic Diastolic CHF -resume PO lasix and add KCL, bmet in am  Epistaxis: -no further episodes -Chronic problem address as outpatient with ENT  COPD -stable -Albuterol nebulizers  QID PRN  -QVAR : Budesonide automatically substituted -Prednisone 5 mg daily  Essential HTN -BP currently controlled on no medication    Acute renal failure  -Resolved with hydration and blood products.   Depression -Cymbalta 30 mg daily -Remeron 15 mg QHS  Hypothyroidism -Synthroid 50 g daily -Obtain TSH  DVT proph: SCDs  Code Status: DO NOT RESUSCITATE Family Communication: no family present at time of exam Disposition Plan: Per GI  Consultants: Dr. Carlean Purl (GI)  Procedure/Significant Events: EGD: 6/6: Half circumferential polypoid mass in the 3rd part of the duodenum; multiple biopsies performed,  Arteriovenous malformation measuring 84mm in the 2nd part of the duodenum  DVT prophylaxis: SCD Objective: VITAL SIGNS: Temp: 98.8 F (37.1 C) (06/07 0535) Temp Source: Oral (06/07 0535) BP: 161/65 mmHg (06/07 0535) Pulse Rate: 94 (06/07 0535) SPO2; FIO2:   Intake/Output Summary (Last 24 hours) at 11/25/14 1129 Last data filed at 11/25/14 8119  Gross per 24 hour  Intake   1614 ml  Output    100 ml  Net   1514 ml     Exam: General: A/O 4, NAD, No acute respiratory distress Eyes: Negative headache, eye pain, double vision, negative retinal hemorrhage ENT: Negative Runny nose, negative ear pain, negative tinnitus, negative gingival bleeding,  Neck:  Negative scars, masses, torticollis, lymphadenopathy, JVD Lungs: Clear to auscultation bilaterally without wheezes or crackles Cardiovascular: Regular rate and rhythm without murmur gallop or rub normal S1 and S2 Abdomen:, Nontender, distended, with multiple surgical scars. bowel sounds positive, large ventral hernia, colostomy Extremities: No significant cyanosis, clubbing, or edema bilateral lower extremities Psychiatric:  Negative depression, positive anxiety Neurologic:  Cranial nerves II through XII intact, tongue/uvula midline, all extremities muscle strength 5/5, sensation intact throughout, negative dysarthria, negative expressive aphasia, negative receptive aphasia.   Data Reviewed: Basic Metabolic Panel:  Recent Labs Lab 11/21/14 1620  11/22/14 0513 11/23/14 1502  11/24/14 0422 11/25/14 1000  NA 136 140  --  138 136  K 3.8 3.6  --  3.6 3.7  CL 103 106  --  105 103  CO2 25 28  --  23 25  GLUCOSE 103* 94  --  93 129*  BUN 30* 26*  --  23* 14  CREATININE 1.17* 0.98  --  1.09* 1.09*  CALCIUM 9.5 8.9  --  8.9 9.2  MG  --   --  2.0  --   --    Liver Function Tests:  Recent Labs Lab 11/22/14 0513  AST 21  ALT 19  ALKPHOS 36*  BILITOT 1.6*  PROT 4.8*  ALBUMIN 3.0*   No results for input(s): LIPASE, AMYLASE in the last 168 hours. No results for input(s): AMMONIA in the last 168 hours. CBC:  Recent Labs Lab 11/21/14 1620 11/22/14 0513 11/23/14 1848 11/24/14 0422 11/25/14 1000  WBC 6.8 5.6 5.5 6.1 5.5  NEUTROABS 5.0  --  3.6  --   --   HGB 7.5* 8.4* 8.9* 8.4* 8.4*  HCT 22.7* 24.5* 26.5* 24.0* 24.9*  MCV 96.2 92.8 94.0 92.3 94.0  PLT 136* 102* 108* 98* 95*   Cardiac Enzymes: No results for input(s): CKTOTAL, CKMB, CKMBINDEX, TROPONINI in the last 168 hours. BNP (last 3 results) No results for input(s): BNP in the last 8760 hours.  ProBNP (last 3 results) No results for input(s): PROBNP in the last 8760 hours.  CBG: No results for input(s): GLUCAP in the last 168 hours.  Recent Results (from the past 240 hour(s))  MRSA PCR Screening     Status: None   Collection Time: 11/22/14  4:00 AM  Result Value Ref Range Status   MRSA by PCR NEGATIVE NEGATIVE Final    Comment:        The GeneXpert MRSA Assay (FDA approved for NASAL specimens only), is one component of a comprehensive MRSA colonization surveillance program. It is not intended to diagnose MRSA infection nor to guide or monitor treatment for MRSA infections.    Scheduled Meds:  Scheduled Meds: . budesonide  0.5 mg Nebulization BID  . DULoxetine  30 mg Oral Daily  . famotidine  40 mg Oral QHS  . furosemide  40 mg Oral Daily  . levothyroxine  50 mcg Oral QAC breakfast  . mirtazapine  15 mg Oral QHS  . pantoprazole  40 mg Oral Q0600   . polyethylene glycol  17 g Oral Daily  . potassium chloride  40 mEq Oral Daily  . predniSONE  5 mg Oral Q breakfast  . senna-docusate  1 tablet Oral QHS    Time spent on care of this patient: 33min   Onda Kattner , MD  [facility] Office  [phone] Pager - [phone] On-Call/Text Page:      [email or web address]      password [email or web address]  If 7PM-7AM, please contact night-coverage [email or web address] Password [email or web address] 11/25/2014, 11:29 AM

## 2014-11-25 NOTE — Progress Notes (Signed)
Daily Rounding Note  11/25/2014, 8:32 AM    SUBJECTIVE:       More SOB today and also c/o edema of UE and LE worse on left.  At home she takes prn Lasix about 2 x weekly for this and is requesting this.  Requesting laxative.    OBJECTIVE:         Vital signs in last 24 hours:    Temp:  [98.3 F (36.8 C)-99.3 F (37.4 C)] 98.8 F (37.1 C) (06/07 0535) Pulse Rate:  [51-102] 94 (06/07 0535) Resp:  [18-26] 21 (06/07 0535) BP: (115-187)/(51-100) 161/65 mmHg (06/07 0535) SpO2:  [83 %-100 %] 99 % (06/07 0535) Weight:  [189 lb 6.4 oz (85.911 kg)] 189 lb 6.4 oz (85.911 kg) (06/07 0535) Last BM Date: 11/22/14 Filed Weights   11/22/14 0439 11/24/14 0500 11/25/14 0535  Weight: 178 lb 9.2 oz (81 kg) 178 lb 8 oz (80.967 kg) 189 lb 6.4 oz (85.911 kg)   General: anxious, comfortable.     Heart: RRR Chest: clear without wheezing but is more dyspneic.  Some dry cough, not as severe as last week.  Abdomen: large ventral hernia.  Ostomy on left.  NT.  BS hypoactive.  Soft.   Extremities: no CCE.   Neuro/Psych:  Anxious, alert.  Oriented x 3.  Moves all 4s.   Intake/Output from previous day: 06/06 0701 - 06/07 0700 In: 1749 [P.O.:1280; I.V.:400] Out: 100 [Stool:100]  Intake/Output this shift:    Lab Results:  Recent Labs  11/23/14 1848 11/24/14 0422  WBC 5.5 6.1  HGB 8.9* 8.4*  HCT 26.5* 24.0*  PLT 108* 98*   BMET  Recent Labs  11/24/14 0422  NA 138  K 3.6  CL 105  CO2 23  GLUCOSE 93  BUN 23*  CREATININE 1.09*  CALCIUM 8.9   Scheduled Meds: . budesonide  0.5 mg Nebulization BID  . DULoxetine  30 mg Oral Daily  . famotidine  40 mg Oral QHS  . levothyroxine  50 mcg Oral QAC breakfast  . mirtazapine  15 mg Oral QHS  . pantoprazole  40 mg Oral Q0600  . polyethylene glycol  17 g Oral Daily  . predniSONE  5 mg Oral Q breakfast   Continuous Infusions:  PRN Meds:.acetaminophen **OR** acetaminophen, albuterol,  ALPRAZolam, hydrALAZINE    ASSESMENT:   * Anemia, macrocytic.  B12, folate levels normal. Previously iron deficient but currently iron levels ok.  Transfused last week in setting of severe epistaxis, anemia. Episode minor epistaxis 6/2.Transfused again 2 units this admission EGD 6/6 with D3 mass (biopsies pending), D2 AVM (not ablated), mild gastritis, small HH.  Surgical consult requested.  On nocturnal H2 blocker and daily PPI.   * FOBT +. Minor peristomal bleeding when she removes ostomy adhesive but no bloody stools or melena.  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  Dark stool, FOBT +. ? Old blood from epistaxis (pt tends to constipation) vs new GIB from AVMs or ulcer or liver disease related portal htn. .  * New diagnosis of possible cirrhosis made last week based on her CT. Fatty liver is likely cause.   *  Hx diverticulitis with contained sigmoid perforation/abscess 2012, recurrent perforation leading to ex lap/sigmoid resection/end colostomy/gastrostomy 10/2011.  * COPD. Nocturnal oxygen.prednisone dependent. Overall resp status better but still poor overall. No longer has a cough.  C/o edema and increased SOB.  Wonder if  weights are correct, they have been obtained by bed scale, and apparently has gained 11 # in 24 hours ????.     * Anxiety   *  Chronic constipation.  On daily Miralax.   *  CKD   PLAN   *  Await surgical consult and polyp pathology.  Await Hgb level, neeeds IV team to draw from  [facility].   *  Add nightly senokot to daily Miralax, this is her home regimen.   *  Standing scale weights.     [name]  11/25/2014, 8:32 AM Pager: [phone]     Attending physician's note   I have taken an interval history, reviewed the chart and examined the patient. I agree with the Advanced Practitioner's note, impression and recommendations. Duodenal mass, not amenable to endoscopic  removal. This mass is likely leading to chronic blood loss and a possible source of recent melena along with epistaxis. Awaiting biopsies and surgical consult.   [name]. Fuller Plan, MD [facility]

## 2014-11-26 ENCOUNTER — Observation Stay (HOSPITAL_COMMUNITY): Payer: Medicare Other

## 2014-11-26 DIAGNOSIS — D62 Acute posthemorrhagic anemia: Secondary | ICD-10-CM | POA: Diagnosis not present

## 2014-11-26 DIAGNOSIS — K922 Gastrointestinal hemorrhage, unspecified: Secondary | ICD-10-CM | POA: Diagnosis not present

## 2014-11-26 DIAGNOSIS — Z515 Encounter for palliative care: Secondary | ICD-10-CM | POA: Diagnosis not present

## 2014-11-26 DIAGNOSIS — R195 Other fecal abnormalities: Secondary | ICD-10-CM | POA: Diagnosis not present

## 2014-11-26 DIAGNOSIS — K3189 Other diseases of stomach and duodenum: Secondary | ICD-10-CM | POA: Diagnosis not present

## 2014-11-26 LAB — CBC
HCT: 24.4 % — ABNORMAL LOW (ref 36.0–46.0)
HEMOGLOBIN: 8.2 g/dL — AB (ref 12.0–15.0)
MCH: 31.7 pg (ref 26.0–34.0)
MCHC: 33.6 g/dL (ref 30.0–36.0)
MCV: 94.2 fL (ref 78.0–100.0)
PLATELETS: 106 10*3/uL — AB (ref 150–400)
RBC: 2.59 MIL/uL — AB (ref 3.87–5.11)
RDW: 16.5 % — ABNORMAL HIGH (ref 11.5–15.5)
WBC: 5.2 10*3/uL (ref 4.0–10.5)

## 2014-11-26 LAB — BASIC METABOLIC PANEL
Anion gap: 8 (ref 5–15)
BUN: 21 mg/dL — ABNORMAL HIGH (ref 6–20)
CHLORIDE: 102 mmol/L (ref 101–111)
CO2: 26 mmol/L (ref 22–32)
CREATININE: 1.12 mg/dL — AB (ref 0.44–1.00)
Calcium: 9.1 mg/dL (ref 8.9–10.3)
GFR, EST AFRICAN AMERICAN: 53 mL/min — AB (ref >60)
GFR, EST NON AFRICAN AMERICAN: 46 mL/min — AB (ref >60)
Glucose, Bld: 96 mg/dL (ref 65–99)
Potassium: 3.7 mmol/L (ref 3.5–5.1)
SODIUM: 136 mmol/L (ref 135–145)

## 2014-11-26 LAB — PREPARE RBC (CROSSMATCH)

## 2014-11-26 MED ORDER — ALPRAZOLAM 0.25 MG PO TABS
0.2500 mg | ORAL_TABLET | Freq: Three times a day (TID) | ORAL | Status: DC | PRN
  Administered 2014-11-26 (×2): 0.25 mg via ORAL
  Filled 2014-11-26 (×2): qty 1

## 2014-11-26 MED ORDER — SODIUM CHLORIDE 0.9 % IV SOLN
Freq: Once | INTRAVENOUS | Status: AC
  Administered 2014-11-26: 17:00:00 via INTRAVENOUS

## 2014-11-26 MED ORDER — SODIUM CHLORIDE 0.9 % IJ SOLN
10.0000 mL | INTRAMUSCULAR | Status: DC | PRN
  Administered 2014-11-26 – 2014-11-27 (×3): 10 mL
  Filled 2014-11-26 (×2): qty 40

## 2014-11-26 NOTE — Progress Notes (Signed)
Kenyatte Dress MVH:[id number] DOB: January 26, 1934 DOA: 11/21/2014 PCP: [name], MD   Team 1 Transfer 6/6:  Admit HPI / Brief Narrative: 81 yo female PMHx depression, sarcoma right hand, Dupuytrens Dz left hand, breast cancer, COPD on 5 mg prednisone daily, cardiac pacemaker in place, atrial fibrillation, anemia, diverticulosis with contained sigmoid perforation/abscess 2012, recurrent perforation leading to ex lap/sigmoid resection/end colostomy/gastrostomy 10/2011.C Diff in 2012., angiodysplasia of stomach, colon, vitamin B12 def , hypothyroidism  C/O  feeling weak, Pt was noted  6/3 to have epistaxis x1, mild. But was sent over due to Anemia on routine labs, hgb 6.8 per pt. Pt was noted to have hgb 7.5 in the ED.   Seen by GI, sp 2 Units PRBC, EGD with friable duodenal polypoid mass, path pending CCS consulting  HPI/Subjective: Breathing stable. Feels weak and tired.  She is feeling anxious.   Assessment/Plan: Anemia-multifactorial, complex GI history  -diverticulitis with contained sigmoid perforation/abscess 2012, recurrent perforation leading to ex lap/sigmoid resection/end colostomy/gastrostomy 10/2011 -S/p EGD/colonoscopy 2010 with ablation of gastric, duodenal, cecal AVMs. Also Hx of gastric polyp 2010. -s/p 2units PRBC 6/3, hb improved to 8.4, Hb stable since  -6/6:  EGD : Half circumferential polypoid mass in the 3rd part of the duodenum; multiple biopsies performed,  Arteriovenous malformation measuring 59mm in the 2nd part of the duodenum -biopsy consistent with adenoma.  -palliative consulted for goals of care.  -still with dyspnea, weakness. Will transfuse one unit of  PRBC.   Chronic Diastolic CHF -resume PO lasix and add KCL, bmet in am  Epistaxis: -no further episodes -Chronic problem address as outpatient with ENT  COPD -stable -Albuterol nebulizers  QID PRN  -QVAR : Budesonide automatically substituted -Prednisone 5 mg daily  Essential HTN -BP  currently controlled on no medication   Acute renal failure  -Resolved with hydration and blood products.   Depression -Cymbalta 30 mg daily -Remeron 15 mg QHS -complaining of anxiety. Will change xanax to TID prn.   Hypothyroidism -Synthroid 50 g daily  DVT proph: SCDs  Code Status: DO NOT RESUSCITATE Family Communication: care discussed with daughter Disposition Plan: Per GI, blood transfusion, palliative care evaluation. Hopefully discharge 6-9  Consultants: Dr. Carlean Purl (GI)  Procedure/Significant Events: EGD: 6/6: Half circumferential polypoid mass in the 3rd part of the duodenum; multiple biopsies performed,  Arteriovenous malformation measuring 69mm in the 2nd part of the duodenum  DVT prophylaxis: SCD Objective: VITAL SIGNS: BP: 110/52 mmHg (06/07 2345) SPO2; FIO2:   Intake/Output Summary (Last 24 hours) at 11/26/14 1104 Last data filed at 11/25/14 2152  Gross per 24 hour  Intake      0 ml  Output      1 ml  Net     -1 ml     Exam: General: A/O 4, NAD, No acute respiratory distress Neck:  Negative scars, masses, torticollis, lymphadenopathy, JVD Lungs: Clear to auscultation bilaterally without wheezes or crackles Cardiovascular: Regular rate and rhythm without murmur gallop or rub normal S1 and S2 Abdomen:, Nontender, distended, with multiple surgical scars. bowel sounds positive, large ventral hernia, colostomy Extremities: No significant cyanosis, clubbing, or edema bilateral lower extremities    Data Reviewed: Basic Metabolic Panel:  Recent Labs Lab 11/21/14 1620 11/22/14 0513 11/23/14 1502 11/24/14 0422 11/25/14 1000 11/26/14 0550  NA 136 140  --  138 136 136  K 3.8 3.6  --  3.6 3.7 3.7  CL 103 106  --  105 103 102  CO2 25 28  --  23 25 26   GLUCOSE 103* 94  --  93 129* 96  BUN 30* 26*  --  23* 14 21*  CREATININE 1.17* 0.98  --  1.09* 1.09* 1.12*  CALCIUM 9.5 8.9  --  8.9 9.2 9.1  MG  --   --  2.0  --   --   --    Liver Function  Tests:  Recent Labs Lab 11/22/14 0513  AST 21  ALT 19  ALKPHOS 36*  BILITOT 1.6*  PROT 4.8*  ALBUMIN 3.0*   No results for input(s): LIPASE, AMYLASE in the last 168 hours. No results for input(s): AMMONIA in the last 168 hours. CBC:  Recent Labs Lab 11/21/14 1620 11/22/14 0513 11/23/14 1848 11/24/14 0422 11/25/14 1000 11/26/14 0550  WBC 6.8 5.6 5.5 6.1 5.5 5.2  NEUTROABS 5.0  --  3.6  --   --   --   HGB 7.5* 8.4* 8.9* 8.4* 8.4* 8.2*  HCT 22.7* 24.5* 26.5* 24.0* 24.9* 24.4*  MCV 96.2 92.8 94.0 92.3 94.0 94.2  PLT 136* 102* 108* 98* 95* 106*   Cardiac Enzymes: No results for input(s): CKTOTAL, CKMB, CKMBINDEX, TROPONINI in the last 168 hours. BNP (last 3 results) No results for input(s): BNP in the last 8760 hours.  ProBNP (last 3 results) No results for input(s): PROBNP in the last 8760 hours.  CBG: No results for input(s): GLUCAP in the last 168 hours.  Recent Results (from the past 240 hour(s))  MRSA PCR Screening     Status: None   Collection Time: 11/22/14  4:00 AM  Result Value Ref Range Status   MRSA by PCR NEGATIVE NEGATIVE Final    Comment:        The GeneXpert MRSA Assay (FDA approved for NASAL specimens only), is one component of a comprehensive MRSA colonization surveillance program. It is not intended to diagnose MRSA infection nor to guide or monitor treatment for MRSA infections.    Scheduled Meds:  Scheduled Meds: . budesonide  0.5 mg Nebulization BID  . DULoxetine  30 mg Oral Daily  . famotidine  40 mg Oral QHS  . furosemide  40 mg Oral Daily  . levothyroxine  50 mcg Oral QAC breakfast  . mirtazapine  15 mg Oral QHS  . pantoprazole  40 mg Oral Q0600  . polyethylene glycol  17 g Oral Daily  . potassium chloride  40 mEq Oral Daily  . predniSONE  5 mg Oral Q breakfast  . senna-docusate  1 tablet Oral QHS    Time spent on care of this patient: 23min   Elmarie Shiley , MD  [facility] Office  [phone] Pager -  [phone] On-Call/Text Page:      [email or web address]      password [email or web address]  If 7PM-7AM, please contact night-coverage [email or web address] Password [email or web address] 11/26/2014, 11:04 AM

## 2014-11-26 NOTE — Consult Note (Signed)
Consultation Note Date: 11/26/2014   Patient Name: [name]  DOB: 1934/05/12  MRN: [id number]  Age / Sex: 81 y.o., female   PCP: Prince Solian, MD Referring Physician: Elmarie Shiley, MD  Reason for Consultation: Establishing goals of care  Palliative Care Assessment and Plan Summary of Established Goals of Care and Medical Treatment Preferences    Palliative Care Discussion Held Today:   Long conversation with [name] and daughter, [name]. We discussed options for her duodenal mass and unknown prognosis for going forward. Discussed options as far as palliative vs hospice and when each would be appropriate. We all agree she needs time to see how this mass will impact her life (i.e. If she finds herself needing hospitalization every few weeks with bleeding or can be optimized and return to some QOL). We discussed trying to optimize her independence and health so that she can still find enjoyment in day to day life. She also says she is not afraid to die and feels peace for whatever comes after death. Support provided and concerns acknowledged. Recommend palliative care to follow up to help support and they should be able to follow in SNF and in ALF depending on disposition.    - She is concerned with being a burden on her family - Goal is to optimize her stamina so she continue to do activities in life she enjoys. They would like to consider short term rehab to try and achieve this goal.  - She is very concerned with needing frequent blood work to follow this anemia and is requesting considering for PICC or PAC as lab sticks are always very difficult - recommend f/u with Dr. Alvy Bimler on recommendations.  - [facility] not [facility] options regarding duodenal mass - still considering.    Contacts/Participants in Discussion: Primary Decision Maker: Patient   Code Status: DNR confirmed  Symptom Management:   Bowel regimen: Continue Senokot-S qhs (may  increase to 2 tablets if ineffective).   Dyspnea: Baseline COPD and this is complicated by anemia. Continue treatment per primary team.   Psycho-social/Spiritual:   Support System: Very supported. Daughter, [name] tries to visit most days. She has twin sons in [place] (facetime at least every other day) and [place]. They all encourage her as to how important and cherished she is to them. Sounds they have concern as she appears to be easily depressed especially since her husband died ~2 yrs ago as she has had many changes and obstacles.   Desire for further Chaplaincy support: she is receiving visits from her personal chaplain.   Prognosis: Unable to determine  Discharge Planning: Hopeful for [place] for rehab with Palliative care service follow-up       Chief Complaint: Weakness, anemia  History of Present Illness: 81 yo female PMHx depression, sarcoma right hand, Dupuytrens Dz left hand, breast cancer, COPD on 5 mg prednisone daily, cardiac pacemaker in place, atrial fibrillation, anemia, diverticulosis with contained sigmoid perforation/abscess 2012, recurrent perforation leading to ex lap/sigmoid resection/end colostomy/gastrostomy 10/2011.C Diff in 2012., angiodysplasia of stomach, colon, vitamin B12 def , hypothyroidism. C/O feeling weak, Pt was noted 6/3 to have epistaxis x1, mild. But was sent over due to Anemia on routine labs, hgb 6.8 per pt. Pt was noted to have hgb 7.5 in the ED.Seen by GI, sp 2 Units PRBC, EGD with friable duodenal polypoid mass, path negative for malignancy (although may be superficial sample). GI and [facility] following for recs. Palliative  consulted for support and assistance with decisions for treatment.   Primary Diagnoses  Present on Admission:  . GI bleed . Anemia . Epistaxis . Acute blood loss anemia . Acute GI bleeding . Other emphysema . Essential hypertension . Acute renal failure syndrome . Depression . Other specified  hypothyroidism  Palliative Review of Systems:   + weakness   I have reviewed the medical record, interviewed the patient and family, and examined the patient. The following aspects are pertinent.  Past Medical History  Diagnosis Date  . COPD (chronic obstructive pulmonary disease)   . Asthma   . Presence of permanent cardiac pacemaker   . Hypertension   . Hypothyroidism   . Anxiety   . GERD (gastroesophageal reflux disease)   . Anemia   . Cancer     breast; finger  . PONV (postoperative nausea and vomiting)    History   Social History  . Marital Status: Married    Spouse Name: N/A  . Number of Children: N/A  . Years of Education: N/A   Social History Main Topics  . Smoking status: Former Research scientist (life sciences)  . Smokeless tobacco: Not on file  . Alcohol Use: 4.2 oz/week    0 Standard drinks or equivalent, 7 Glasses of wine per week  . Drug Use: No  . Sexual Activity: Not on file   Other Topics Concern  . None   Social History Narrative   History reviewed. No pertinent family history. Scheduled Meds: . sodium chloride   Intravenous Once  . budesonide  0.5 mg Nebulization BID  . DULoxetine  30 mg Oral Daily  . famotidine  40 mg Oral QHS  . furosemide  40 mg Oral Daily  . levothyroxine  50 mcg Oral QAC breakfast  . mirtazapine  15 mg Oral QHS  . pantoprazole  40 mg Oral Q0600  . polyethylene glycol  17 g Oral Daily  . potassium chloride  40 mEq Oral Daily  . predniSONE  5 mg Oral Q breakfast  . senna-docusate  1 tablet Oral QHS   Continuous Infusions:  PRN Meds:.acetaminophen **OR** acetaminophen, albuterol, ALPRAZolam, hydrALAZINE, sodium chloride Medications Prior to Admission:  Prior to Admission medications   Medication Sig Start Date End Date Taking? Authorizing Provider  acetaminophen (TYLENOL) 325 MG tablet Take 650 mg by mouth at bedtime.    Yes Historical Provider, MD  albuterol (PROVENTIL) (2.5 MG/3ML) 0.083% nebulizer solution Take 2.5 mg by nebulization every  6 (six) hours as needed for wheezing or shortness of breath.   Yes Historical Provider, MD  ALPRAZolam (XANAX) 0.25 MG tablet Take 0.25 mg by mouth at bedtime. 11/18/14  Yes Historical Provider, MD  budesonide (RHINOCORT AQUA) 32 MCG/ACT nasal spray Place 2 sprays into both nostrils daily.   Yes Historical Provider, MD  Carboxymethylcellulose Sodium (LUBRICANT EYE DROPS OP) Apply 1 drop to eye daily.   Yes Historical Provider, MD  dexlansoprazole (DEXILANT) 60 MG capsule Take 60 mg by mouth daily.   Yes Historical Provider, MD  DULoxetine (CYMBALTA) 30 MG capsule Take 30 mg by mouth at bedtime. 10/28/14  Yes Historical Provider, MD  Eszopiclone 3 MG TABS Take 3 mg by mouth daily as needed (sleep).  10/28/14  Yes Historical Provider, MD  guaiFENesin-dextromethorphan (ROBITUSSIN DM) 100-10 MG/5ML syrup Take 15 mLs by mouth every 4 (four) hours as needed for cough.   Yes Historical Provider, MD  hydrocortisone cream 1 % Apply 1 application topically daily as needed for itching.   Yes Historical  Provider, MD  levothyroxine (SYNTHROID, LEVOTHROID) 50 MCG tablet Take 50 mcg by mouth daily. 09/22/14  Yes Historical Provider, MD  losartan (COZAAR) 50 MG tablet Take 50 mg by mouth daily. 10/25/14  Yes Historical Provider, MD  metoprolol succinate (TOPROL-XL) 50 MG 24 hr tablet Take 50 mg by mouth daily. 09/22/14  Yes Historical Provider, MD  mirtazapine (REMERON) 15 MG tablet Take 15 mg by mouth at bedtime.  09/16/14  Yes Historical Provider, MD  Multiple Vitamins-Minerals (MULTIVITAMIN & MINERAL PO) Take 1 tablet by mouth daily.   Yes Historical Provider, MD  mupirocin cream (BACTROBAN) 2 % Apply 1 application topically 2 (two) times daily.   Yes Historical Provider, MD  oxycodone (OXY-IR) 5 MG capsule Take 5 mg by mouth every 4 (four) hours as needed for pain.   Yes Historical Provider, MD  polyethylene glycol (MIRALAX / GLYCOLAX) packet Take 17 g by mouth daily as needed for mild constipation or moderate  constipation.   Yes Historical Provider, MD  pravastatin (PRAVACHOL) 40 MG tablet Take 40 mg by mouth at bedtime.  10/26/14  Yes Historical Provider, MD  predniSONE (DELTASONE) 5 MG tablet Take 5 mg by mouth daily with breakfast.   Yes Historical Provider, MD  PROAIR HFA 108 (90 BASE) MCG/ACT inhaler Inhale 2 puffs into the lungs 2 (two) times daily.  11/11/14  Yes Historical Provider, MD  QVAR 80 MCG/ACT inhaler Inhale 3 puffs into the lungs 2 (two) times daily. 10/28/14  Yes Historical Provider, MD  ranitidine (ZANTAC) 300 MG tablet Take 300 mg by mouth at bedtime. 10/25/14  Yes Historical Provider, MD  traZODone (DESYREL) 50 MG tablet Take 50 mg by mouth at bedtime as needed for sleep.   Yes Historical Provider, MD  Vitamin D, Ergocalciferol, (DRISDOL) 50000 UNITS CAPS capsule Take 50,000 Units by mouth once a week. Every Sunday 10/25/14  Yes Historical Provider, MD  zafirlukast (ACCOLATE) 20 MG tablet Take 20 mg by mouth 2 (two) times daily. 09/22/14  Yes Historical Provider, MD   Allergies  Allergen Reactions  . Aspirin   . Sulfa Antibiotics    CBC:    Component Value Date/Time   WBC 5.2 11/26/2014 0550   HGB 8.2* 11/26/2014 0550   HCT 24.4* 11/26/2014 0550   PLT 106* 11/26/2014 0550   MCV 94.2 11/26/2014 0550   NEUTROABS 3.6 11/23/2014 1848   LYMPHSABS 1.4 11/23/2014 1848   MONOABS 0.4 11/23/2014 1848   EOSABS 0.1 11/23/2014 1848   BASOSABS 0.0 11/23/2014 1848   Comprehensive Metabolic Panel:    Component Value Date/Time   NA 136 11/26/2014 0550   K 3.7 11/26/2014 0550   CL 102 11/26/2014 0550   CO2 26 11/26/2014 0550   BUN 21* 11/26/2014 0550   CREATININE 1.12* 11/26/2014 0550   GLUCOSE 96 11/26/2014 0550   CALCIUM 9.1 11/26/2014 0550   AST 21 11/22/2014 0513   ALT 19 11/22/2014 0513   ALKPHOS 36* 11/22/2014 0513   BILITOT 1.6* 11/22/2014 0513   PROT 4.8* 11/22/2014 0513   ALBUMIN 3.0* 11/22/2014 0513    Physical Exam:  Vital Signs: BP 110/52 mmHg  Pulse 102   Temp(Src) 99 F (37.2 C) (Oral)  Resp 19  Ht 5' 4.5" (1.638 m)  Wt 84.823 kg (187 lb)  BMI 31.61 kg/m2  SpO2 97% SpO2: SpO2: 97 % O2 Device: O2 Device: Not Delivered O2 Flow Rate: O2 Flow Rate (L/min): 1.5 L/min Intake/output summary:  Intake/Output Summary (Last 24 hours) at 11/26/14 1635 Last  data filed at 11/26/14 1229  Gross per 24 hour  Intake    480 ml  Output      1 ml  Net    479 ml   LBM: Last BM Date: 11/22/14 Baseline Weight: Weight: 80.74 kg (178 lb) Most recent weight: Weight: 84.823 kg (187 lb)  Exam Findings:   General: NAD, sitting up in recliner HEENT: [place]/AT, moist mucous membranes CVS: RRR Resp: Slightly short of breath after walk to bathroom and occasional while conversing Abd: Soft, NT, distended, colostomy Neuro: Awake, alert, oriented x 3           Palliative Performance Scale: 50 %                Additional Data Reviewed: Recent Labs     11/25/14  1000  11/26/14  0550  WBC  5.5  5.2  HGB  8.4*  8.2*  PLT  95*  106*  NA  136  136  BUN  14  21*  CREATININE  1.09*  1.12*     Time In: 1420 Time Out: 1600 Time Total: 124min  Greater than 50%  of this time was spent counseling and coordinating care related to the above assessment and plan.   Signed by:  Vinie Sill, NP Palliative Medicine Team Pager # [phone] (M-F 8a-5p) Team Phone # [phone] (Nights/Weekends)

## 2014-11-26 NOTE — Progress Notes (Signed)
Daily Rounding Note  11/26/2014, 8:10 AM    SUBJECTIVE:       Black, formed stool yesterday when ostomy bag changed.  Breathing and cough better. Pt not sure what to do re: her anemia, duodenal mass.  Several options proffered by Dr Redmond Pulling.   OBJECTIVE:         Vital signs in last 24 hours:    Temp:  [98.7 F (37.1 C)-99 F (37.2 C)] 99 F (37.2 C) (06/07 2151) Pulse Rate:  [76-102] 102 (06/07 2151) Resp:  [18-20] 19 (06/07 2151) BP: (110-173)/(52-72) 110/52 mmHg (06/07 2345) SpO2:  [97 %-100 %] 97 % (06/07 2151) Weight:  [187 lb (84.823 kg)] 187 lb (84.823 kg) (06/07 2345) Last BM Date: 11/22/14 Filed Weights   11/24/14 0500 11/25/14 0535 11/25/14 2345  Weight: 178 lb 8 oz (80.967 kg) 189 lb 6.4 oz (85.911 kg) 187 lb (84.823 kg)   General: comfortable, anxious   Heart: RRR Chest: clear bil.  No cough, dyspnea improved Abdomen: soft, NT, large lower abdominal hernia.  BS hypoactive.  Extremities:  No CCE Neuro/Psych:  Anxious, alert, oriented x 3.  No gross deficits.   Intake/Output from previous day: 06/07 0701 - 06/08 0700 In: 240 [P.O.:240] Out: 1 [Stool:1]  Intake/Output this shift:    Lab Results:  Recent Labs  11/24/14 0422 11/25/14 1000 11/26/14 0550  WBC 6.1 5.5 5.2  HGB 8.4* 8.4* 8.2*  HCT 24.0* 24.9* 24.4*  PLT 98* 95* 106*   BMET  Recent Labs  11/24/14 0422 11/25/14 1000 11/26/14 0550  NA 138 136 136  K 3.6 3.7 3.7  CL 105 103 102  CO2 23 25 26   GLUCOSE 93 129* 96  BUN 23* 14 21*  CREATININE 1.09* 1.09* 1.12*  CALCIUM 8.9 9.2 9.1    Studies/Results: No results found.  Scheduled Meds: . budesonide  0.5 mg Nebulization BID  . DULoxetine  30 mg Oral Daily  . famotidine  40 mg Oral QHS  . furosemide  40 mg Oral Daily  . levothyroxine  50 mcg Oral QAC breakfast  . mirtazapine  15 mg Oral QHS  . pantoprazole  40 mg Oral Q0600  . polyethylene glycol  17 g Oral Daily  .  potassium chloride  40 mEq Oral Daily  . predniSONE  5 mg Oral Q breakfast  . senna-docusate  1 tablet Oral QHS   Continuous Infusions:  PRN Meds:.acetaminophen **OR** acetaminophen, albuterol, ALPRAZolam, hydrALAZINE, sodium chloride   ASSESMENT:   * Anemia, macrocytic, melena in setting of epistaxis and UGIB.  EGD 6/6 with D3 mass (tubular adenoma), small D2 AVM (not ablated), mild gastritis, small HH.  On nocturnal H2 blocker and daily PPI. Hgb stable since transfusions (2 PRBCs 6/3 - 6/4)  Dr Redmond Pulling does not feel she is surgical resection candidate.  Wondering about tertiary referral for lift polypectomy.  If massive bleeding, recommends IR embolization.  .   * New diagnosis of possible cirrhosis made last week based on her CT. Fatty liver is likely cause.   * Hx diverticulitis with contained sigmoid perforation/abscess 2012, recurrent perforation leading to ex lap/sigmoid resection/end colostomy/gastrostomy 10/2011.  * COPD. Nocturnal oxygen.prednisone dependent. Overall resp status better but still poor overall.   C/o edema and increased SOB.weight increased ~ 10# over this admission.    * Anxiety.  Pt overwhelmed by what decision to make in regards to the duodenal adenoma and anemia    * Chronic  constipation. On daily Miralax.   * CKD    PLAN   *  Obtain goals of care consult to help her sort through her options.    [name]  11/26/2014, 8:10 AM Pager: [phone]     Attending physician's note   I have taken an interval history, reviewed the chart and examined the patient. I agree with the Advanced Practitioner's note, impression and recommendations. I had a long discussion in the room with the patient and her daughter. Palliative care consult was in the room at the time of my visit. I discussed the case in detail with Dr. Redmond Pulling today prior to my visit. No surgical resection of the D3 polyp is recommended at this time due to very high risk of  mobidity and mortality as well outlined in the surgical notes. Endoscopic resection is not an option in this community but is at least a potential option at a tertiary center. However a major complication of polypectomy attempt would need acceptance of surgery to manage the complication and surgery is not an option for her. So tertiary GI referral probably not a viable option. Dr. Norman Herrlich to discuss again after discharge if pt has an interest in a tertiary GI opinion. Ongoing outpatient Hematology support is very important and she has an appt scheduled following discharge. Outpatient GI follow up with Dr. Zenovia Jarred in a few weeks. GI signing off.   [name]. Fuller Plan, MD [facility]

## 2014-11-26 NOTE — Progress Notes (Signed)
Occupational Therapy Evaluation Patient Details Name: [name] MRN: [id number] DOB: 1933/10/10 Today's Date: 11/26/2014    History of Present Illness Patient is a 81 y/o female with PMH of depression, sarcoma right hand, Dupuytrens Dz left hand, breast cancer, COPD on 5 mg prednisone daily, cardiac pacemaker in place, atrial fibrillation, anemia, diverticulosis with contained sigmoid perforation/abscess 2012, recurrent perforation leading to ex lap/sigmoid resection/end colostomy/gastrostomy 10/2011.C Diff in 2012., angiodysplasia of stomach, colon, vitamin B12 def , hypothyroidism admitted for weakess and anemia s/p sp 2 Units PRBC; EGD with friable duodenal polypoid mass, path pending   Clinical Impression   Patient presenting with overall deconditioning secondary to above diagnoses. Patient mod I PTA. Patient currently functioning at an overall supervision level. Patient will benefit from acute OT to increase overall independence in the areas of ADLs, functional mobility, education on energy conservation techniques/strategies, and overall safety in order to safely discharge back to her assisted living apartment with intermittent supervision. At this time, no follow-up OT recommended. Will continue to follow acutely and change d/c plan as appropriate.   Pt's 02 sats remained greater than 95% on RA during functional mobility throughout room. Pt states she only wears oxygen at night time. At end of session, left pt seated in recliner on RA.     Follow Up Recommendations  No OT follow up;Supervision - Intermittent    Equipment Recommendations  None recommended by OT    Recommendations for Other Services  None at this time   Precautions / Restrictions Precautions Precautions: None Restrictions Weight Bearing Restrictions: No      Mobility Bed Mobility Overal bed mobility: Modified Independent General bed mobility comments: Extra time needed  Transfers Overall transfer level: Needs  assistance Equipment used: Rolling walker (2 wheeled) Transfers: Sit to/from Stand Sit to Stand: Supervision General transfer comment: Supervision for safety. Cues for energy conservation techniques     Balance Overall balance assessment: Needs assistance Sitting-balance support: No upper extremity supported;Feet supported Sitting balance-Leahy Scale: Good     Standing balance support: Bilateral upper extremity supported;During functional activity Standing balance-Leahy Scale: Fair     ADL Overall ADL's : Needs assistance/impaired   General ADL Comments: Pt overall supervision for ADLs using rollator. Pt will benefit from education on energy conservation techniques to help increase independence and safety during ADLs, IADLs, and functional mobility. Encouraged pursed lip breathing with self-care and mobility. Educated pt on breaking down tasks and making sure she completes tasks when she has enough energy, pt reports she has most energy at night. Recommend pt bathe at night (she does not shower due to her ostomy bag), and dress in the am since her biggest complaint is dressing at this time "I get so wore out, so I don't get dressed until around noon".     Pertinent Vitals/Pain Pain Assessment: No/denies pain   Extremity/Trunk Assessment Upper Extremity Assessment Upper Extremity Assessment: Generalized weakness   Lower Extremity Assessment Lower Extremity Assessment: Defer to PT evaluation   Cervical / Trunk Assessment Cervical / Trunk Assessment: Normal   Communication Communication Communication: No difficulties   Cognition Arousal/Alertness: Awake/alert Behavior During Therapy: WFL for tasks assessed/performed Overall Cognitive Status: Within Functional Limits for tasks assessed              Home Living Family/patient expects to be discharged to:: Assisted living Home Equipment: Walker - 4 wheels;Wheelchair - power;Bedside commode    Prior Functioning/Environment  Level of Independence: Independent with assistive device(s)  Comments: Pt using rollator for ambulation and power w/c as needed for longer distances. Wears 02 at night. goes to dining hall for meals.     OT Diagnosis: Generalized weakness   OT Problem List: Decreased activity tolerance;Impaired balance (sitting and/or standing);Decreased safety awareness;Decreased knowledge of use of DME or AE   OT Treatment/Interventions: Self-care/ADL training;Energy conservation;DME and/or AE instruction;Therapeutic activities;Patient/family education;Balance training    OT Goals(Current goals can be found in the care plan section) Acute Rehab OT Goals Patient Stated Goal: to get stronger OT Goal Formulation: With patient Time For Goal Achievement: 12/03/14 Potential to Achieve Goals: Good ADL Goals Pt Will Perform Grooming: with modified independence;standing Pt Will Perform Upper Body Bathing: Independently;sitting Pt Will Perform Lower Body Bathing: with modified independence;sit to/from stand Pt Will Perform Upper Body Dressing: Independently;sitting Pt Will Perform Lower Body Dressing: with modified independence;sit to/from stand Pt Will Transfer to Toilet: with modified independence;ambulating;bedside commode Pt Will Perform Toileting - Clothing Manipulation and hygiene: with modified independence;sit to/from stand Additional ADL Goal #1: Patient will be educated on energy conservation techniques to help increase independence with ADLs, IADLs, and functional mobility   OT Frequency: Min 2X/week   Barriers to D/C: Decreased caregiver support   End of Session Equipment Utilized During Treatment: Rolling walker  Activity Tolerance: Patient tolerated treatment well Patient left: in chair;with call bell/phone within reach   Time: 0817-0850 OT Time Calculation (min): 33 min Charges:  OT General Charges $OT Visit: 1 Procedure OT Evaluation $Initial OT Evaluation Tier I: 1  Procedure OT Treatments $Self Care/Home Management : 8-22 mins  Shawnita Krizek , MS, OTR/L, CLT Pager: [phone]  11/26/2014, 8:53 AM

## 2014-11-26 NOTE — Progress Notes (Signed)
Patient ID: [name], female   DOB: [birth year], 81 y.o.   MRN: [id number] 2 Days Post-Op  Subjective: Pt sad appropriately today.  She feels as if this is her way of dying.  She does not want surgery as she knows that she would not tolerate this.  She did have some more dark stools in her colostomy last night  Objective: Vital signs in last 24 hours: Temp:  [98.7 F (37.1 C)-99 F (37.2 C)] 99 F (37.2 C) (06/07 2151) Pulse Rate:  [76-102] 102 (06/07 2151) Resp:  [18-20] 19 (06/07 2151) BP: (110-173)/(52-72) 110/52 mmHg (06/07 2345) SpO2:  [97 %-100 %] 97 % (06/07 2151) Weight:  [84.823 kg (187 lb)] 84.823 kg (187 lb) (06/07 2345) Last BM Date: 11/22/14  Intake/Output from previous day: 06/07 0701 - 06/08 0700 In: 240 [P.O.:240] Out: 1 [Stool:1] Intake/Output this shift:    PE: Abd: soft, NT, ND, hernia from loss of domain, colostomy with an opaque bag so stool is not visible  Lab Results:   Recent Labs  11/25/14 1000 11/26/14 0550  WBC 5.5 5.2  HGB 8.4* 8.2*  HCT 24.9* 24.4*  PLT 95* 106*   BMET  Recent Labs  11/25/14 1000 11/26/14 0550  NA 136 136  K 3.7 3.7  CL 103 102  CO2 25 26  GLUCOSE 129* 96  BUN 14 21*  CREATININE 1.09* 1.12*  CALCIUM 9.2 9.1   PT/INR No results for input(s): LABPROT, INR in the last 72 hours. CMP     Component Value Date/Time   NA 136 11/26/2014 0550   K 3.7 11/26/2014 0550   CL 102 11/26/2014 0550   CO2 26 11/26/2014 0550   GLUCOSE 96 11/26/2014 0550   BUN 21* 11/26/2014 0550   CREATININE 1.12* 11/26/2014 0550   CALCIUM 9.1 11/26/2014 0550   PROT 4.8* 11/22/2014 0513   ALBUMIN 3.0* 11/22/2014 0513   AST 21 11/22/2014 0513   ALT 19 11/22/2014 0513   ALKPHOS 36* 11/22/2014 0513   BILITOT 1.6* 11/22/2014 0513   GFRNONAA 46* 11/26/2014 0550   GFRAA 53* 11/26/2014 0550   Lipase  No results found for: LIPASE     Studies/Results: Dg Chest 1 View  11/26/2014   CLINICAL DATA:  Central line placement  EXAM: CHEST  1  VIEW  COMPARISON:  None.  FINDINGS: Right IJ catheter tip is in the cavoatrial junction. There is a left chest wall pacer device with leads in the right atrial appendage and right ventricle. Normal heart size. The lung volumes are low. There is mild atelectasis in the left base. Calcified granulomas noted in the left upper lobe.  IMPRESSION: 1. Right IJ catheter tip is in the cavoatrial junction. No pneumothorax.   Electronically Signed   By: Kerby Moors M.D.   On: 11/26/2014 08:36    Anti-infectives: Anti-infectives    None       Assessment/Plan   1. Duodenal mass 2. GI bleed 3. H/o epistaxis 4. ABL anemia 5. COPD, 2L O2 at night 6. complex PSH for abdomen 7. A fib, has pacemaker (no anticoagulation 8. On chronic prednisone (5mg  daily)  Plan: Please see Dr. Dois Davenport recommendations from yesterday.  I have had a long d/w the patient and with her daughter via telephone to explain all of these options again.  The patient does not want any surgery.  She is even hesitant to think about seeing a GI doc at a tertiary care facility to consider repeat EGD with attempted polypectomy.  She does want to see Dr. Alvy Bimler as an outpatient for blood work to make sure that she doesn't have a "blood" problem in conjunction with this intermittent bleeding as the source of her anemia.  The patient feels as if this is the end of her road and she will ultimately pass away from this.  I told her I obviously don't know the answer to that, but was there to listen as I think that is all she wanted.  I do think a palliative care consult to discuss long-term goals is reasonable.  The daughter had heard this from the patient as well.  The daughter felt like that would be beneficial as well.  i explained that palliative care did NOT necessarily mean hospice, especially in this case, as this was her first question.  She has a good understanding of palliative care and wanted to clarify for her mom's case.  Nothing surgical  to offer here from our standpoint.  Will defer further work up and care to primary and GI.     Ewa  E 11/26/2014, 10:08 AM Pager: [phone]

## 2014-11-27 ENCOUNTER — Other Ambulatory Visit: Payer: Self-pay | Admitting: Hematology and Oncology

## 2014-11-27 ENCOUNTER — Observation Stay (HOSPITAL_COMMUNITY): Payer: Medicare Other

## 2014-11-27 ENCOUNTER — Encounter (HOSPITAL_COMMUNITY): Payer: Self-pay | Admitting: Radiology

## 2014-11-27 DIAGNOSIS — K3189 Other diseases of stomach and duodenum: Secondary | ICD-10-CM | POA: Diagnosis not present

## 2014-11-27 DIAGNOSIS — D649 Anemia, unspecified: Secondary | ICD-10-CM | POA: Diagnosis not present

## 2014-11-27 DIAGNOSIS — I878 Other specified disorders of veins: Secondary | ICD-10-CM | POA: Diagnosis not present

## 2014-11-27 DIAGNOSIS — D62 Acute posthemorrhagic anemia: Secondary | ICD-10-CM | POA: Diagnosis not present

## 2014-11-27 LAB — CBC
HCT: 30.7 % — ABNORMAL LOW (ref 36.0–46.0)
Hemoglobin: 10.3 g/dL — ABNORMAL LOW (ref 12.0–15.0)
MCH: 31.2 pg (ref 26.0–34.0)
MCHC: 33.6 g/dL (ref 30.0–36.0)
MCV: 93 fL (ref 78.0–100.0)
PLATELETS: 121 10*3/uL — AB (ref 150–400)
RBC: 3.3 MIL/uL — ABNORMAL LOW (ref 3.87–5.11)
RDW: 16.3 % — AB (ref 11.5–15.5)
WBC: 7.7 10*3/uL (ref 4.0–10.5)

## 2014-11-27 LAB — TYPE AND SCREEN
ABO/RH(D): A POS
Antibody Screen: NEGATIVE
UNIT DIVISION: 0

## 2014-11-27 MED ORDER — FENTANYL CITRATE (PF) 100 MCG/2ML IJ SOLN
INTRAMUSCULAR | Status: AC
  Filled 2014-11-27: qty 2

## 2014-11-27 MED ORDER — LIDOCAINE-EPINEPHRINE (PF) 1 %-1:200000 IJ SOLN
INTRAMUSCULAR | Status: DC
Start: 2014-11-27 — End: 2014-11-27
  Filled 2014-11-27: qty 10

## 2014-11-27 MED ORDER — CEFAZOLIN SODIUM-DEXTROSE 2-3 GM-% IV SOLR: 2.0000 g | Freq: Once | INTRAVENOUS | Status: DC

## 2014-11-27 MED ORDER — LIDOCAINE HCL 1 % IJ SOLN
INTRAMUSCULAR | Status: AC
  Filled 2014-11-27: qty 20

## 2014-11-27 MED ORDER — MIDAZOLAM HCL 2 MG/2ML IJ SOLN
INTRAMUSCULAR | Status: AC
  Filled 2014-11-27: qty 2

## 2014-11-27 MED ORDER — CEFAZOLIN SODIUM-DEXTROSE 2-3 GM-% IV SOLR
INTRAVENOUS | Status: AC
  Administered 2014-11-27: 2 g via INTRAVENOUS
  Filled 2014-11-27: qty 50

## 2014-11-27 MED ORDER — HEPARIN SOD (PORK) LOCK FLUSH 100 UNIT/ML IV SOLN
INTRAVENOUS | Status: AC
  Filled 2014-11-27: qty 10

## 2014-11-27 MED ORDER — MIDAZOLAM HCL 2 MG/2ML IJ SOLN
INTRAMUSCULAR | Status: AC | PRN
  Administered 2014-11-27: 1 mg via INTRAVENOUS

## 2014-11-27 MED ORDER — FENTANYL CITRATE (PF) 100 MCG/2ML IJ SOLN
INTRAMUSCULAR | Status: AC | PRN
  Administered 2014-11-27: 50 ug via INTRAVENOUS

## 2014-11-27 NOTE — Progress Notes (Signed)
NH packet completed, d/c summary added.  Daughter to transport to facility.

## 2014-11-27 NOTE — Sedation Documentation (Signed)
Patient is resting comfortably. 

## 2014-11-27 NOTE — Discharge Summary (Signed)
Physician Discharge Summary  [name] LAG:[id number] DOB: Dec 29, 1933 DOA: 11/21/2014  PCP: [name], MD  Admit date: 11/21/2014 Discharge date: 11/27/2014  Time spent: 35 minutes  Recommendations for Outpatient Follow-up:  Needs to follow with [name] for evaluation of IV iron infusion and Blood transfusion as needed.    Discharge Diagnoses:    Acute GI bleed   Anemia   Epistaxis   Renal insufficiency   Acute blood loss anemia   Acute GI bleeding   Other emphysema   Essential hypertension   Acute renal failure syndrome   Depression   Other specified hypothyroidism   Duodenal mass   Absolute anemia   Occult blood in stools   Palliative care encounter   Poor venous access   Discharge Condition: stable.   Diet recommendation: heart healthy  Filed Weights   11/25/14 0535 11/25/14 2345 11/27/14 0500  Weight: 85.911 kg (189 lb 6.4 oz) 84.823 kg (187 lb) 86.365 kg (190 lb 6.4 oz)    History of present illness:  81 yo female with hx of anemia, diverticulosis, angiodysplasia of stomach, colon, vitamin b12 def apparently c/o feeling weak, Pt was noted today to have epistaxis x1, mild. But was sent over due to Anemia on routine labs, hgb 6.8 per pt. Pt was noted to have hgb 7.5 Here in the ED. Pt admits to slight sob. In addition to fatigue. Pt will be admitted for transfusion as per ED request  Hospital Course:  Anemia-multifactorial, complex GI history  -diverticulitis with contained sigmoid perforation/abscess 2012, recurrent perforation leading to ex lap/sigmoid resection/end colostomy/gastrostomy 10/2011 -S/p EGD/colonoscopy 2010 with ablation of gastric, duodenal, cecal AVMs. Also Hx of gastric polyp 2010. -s/p 2units PRBC 6/3, hb improved to 8.4, Hb stable since  -6/6: EGD : Half circumferential polypoid mass in the 3rd part of the duodenum; multiple biopsies performed, Arteriovenous malformation measuring 1mm in the 2nd part of the duodenum -biopsy  consistent with adenoma.  -palliative consulted for goals of care. see palliative note -feels better after blood transfusion. Hb today at 10.  Discussed with [name] patient is very hard to get IV access, unable to had PICC line placement. Due to need for continue IV iron and blood transfusion, she agree with port cath placement. IR will place this today. Patient will be discharge today.   Chronic Diastolic CHF -resume PO lasix and add KCL, bmet in am  Epistaxis: -no further episodes -Chronic problem address as outpatient with ENT  COPD -stable -Albuterol nebulizers QID PRN  -QVAR : Budesonide automatically substituted -Prednisone 5 mg daily  Essential HTN -BP currently controlled on no medication   Acute renal failure  -Resolved with hydration and blood products.   Depression -Cymbalta 30 mg daily -Remeron 15 mg QHS -complaining of anxiety. Will change xanax to TID prn.   Hypothyroidism -Synthroid 50 g daily  Procedures: EGD: 6/6: Half circumferential polypoid mass in the 3rd part of the duodenum; multiple biopsies performed, Arteriovenous malformation measuring 23mm in the 2nd part of the duodenum  Consultations:  Gessner  Sx  Discharge Exam: Filed Vitals:   11/27/14 1420  BP: 122/61  Pulse: 99  Temp: 99.1 F (37.3 C)  Resp: 18    General: Alert in no distress.  Cardiovascular: S 1, S 2 RRR Respiratory: CTA  Discharge Instructions    Current Discharge Medication List    CONTINUE these medications which have NOT CHANGED   Details  acetaminophen (TYLENOL) 325 MG tablet Take 650 mg by mouth at  bedtime.     albuterol (PROVENTIL) (2.5 MG/3ML) 0.083% nebulizer solution Take 2.5 mg by nebulization every 6 (six) hours as needed for wheezing or shortness of breath.    ALPRAZolam (XANAX) 0.25 MG tablet Take 0.25 mg by mouth at bedtime.    budesonide (RHINOCORT AQUA) 32 MCG/ACT nasal spray Place 2 sprays into both nostrils daily.     Carboxymethylcellulose Sodium (LUBRICANT EYE DROPS OP) Apply 1 drop to eye daily.    dexlansoprazole (DEXILANT) 60 MG capsule Take 60 mg by mouth daily.    DULoxetine (CYMBALTA) 30 MG capsule Take 30 mg by mouth at bedtime. Refills: 3    Eszopiclone 3 MG TABS Take 3 mg by mouth daily as needed (sleep).  Refills: 2    guaiFENesin-dextromethorphan (ROBITUSSIN DM) 100-10 MG/5ML syrup Take 15 mLs by mouth every 4 (four) hours as needed for cough.    hydrocortisone cream 1 % Apply 1 application topically daily as needed for itching.    levothyroxine (SYNTHROID, LEVOTHROID) 50 MCG tablet Take 50 mcg by mouth daily. Refills: 3    metoprolol succinate (TOPROL-XL) 50 MG 24 hr tablet Take 50 mg by mouth daily. Refills: 1    mirtazapine (REMERON) 15 MG tablet Take 15 mg by mouth at bedtime.  Refills: 3    Multiple Vitamins-Minerals (MULTIVITAMIN & MINERAL PO) Take 1 tablet by mouth daily.    mupirocin cream (BACTROBAN) 2 % Apply 1 application topically 2 (two) times daily.    oxycodone (OXY-IR) 5 MG capsule Take 5 mg by mouth every 4 (four) hours as needed for pain.    polyethylene glycol (MIRALAX / GLYCOLAX) packet Take 17 g by mouth daily as needed for mild constipation or moderate constipation.    pravastatin (PRAVACHOL) 40 MG tablet Take 40 mg by mouth at bedtime.  Refills: 2    predniSONE (DELTASONE) 5 MG tablet Take 5 mg by mouth daily with breakfast.    PROAIR HFA 108 (90 BASE) MCG/ACT inhaler Inhale 2 puffs into the lungs 2 (two) times daily.     QVAR 80 MCG/ACT inhaler Inhale 3 puffs into the lungs 2 (two) times daily. Refills: 2    ranitidine (ZANTAC) 300 MG tablet Take 300 mg by mouth at bedtime. Refills: 3    traZODone (DESYREL) 50 MG tablet Take 50 mg by mouth at bedtime as needed for sleep.    Vitamin D, Ergocalciferol, (DRISDOL) 50000 UNITS CAPS capsule Take 50,000 Units by mouth once a week. Every Sunday Refills: 3    zafirlukast (ACCOLATE) 20 MG tablet Take 20  mg by mouth 2 (two) times daily. Refills: 3      STOP taking these medications     losartan (COZAAR) 50 MG tablet        Allergies  Allergen Reactions  . Aspirin   . Sulfa Antibiotics    Follow-up Information    Follow up with [name], MD In 1 week.   Specialty:  Internal Medicine   Contact information:   [place] [address] [phone]       Follow up with [facility], NI, MD In 1 week.   Specialty:  Hematology and Oncology   Contact information:   [place] [id number] [phone]        The results of significant diagnostics from this hospitalization (including imaging, microbiology, ancillary and laboratory) are listed below for reference.    Significant Diagnostic Studies: Dg Chest 1 View  11/26/2014   CLINICAL DATA:  Central  line placement  EXAM: CHEST  1 VIEW  COMPARISON:  None.  FINDINGS: Right IJ catheter tip is in the cavoatrial junction. There is a left chest wall pacer device with leads in the right atrial appendage and right ventricle. Normal heart size. The lung volumes are low. There is mild atelectasis in the left base. Calcified granulomas noted in the left upper lobe.  IMPRESSION: 1. Right IJ catheter tip is in the cavoatrial junction. No pneumothorax.   Electronically Signed   By: Kerby Moors M.D.   On: 11/26/2014 08:36    Microbiology: Recent Results (from the past 240 hour(s))  MRSA PCR Screening     Status: None   Collection Time: 11/22/14  4:00 AM  Result Value Ref Range Status   MRSA by PCR NEGATIVE NEGATIVE Final    Comment:        The GeneXpert MRSA Assay (FDA approved for NASAL specimens only), is one component of a comprehensive MRSA colonization surveillance program. It is not intended to diagnose MRSA infection nor to guide or monitor treatment for MRSA infections.      Labs: Basic Metabolic Panel:  Recent Labs Lab 11/21/14 1620 11/22/14 0513 11/23/14 1502 11/24/14 0422  11/25/14 1000 11/26/14 0550  NA 136 140  --  138 136 136  K 3.8 3.6  --  3.6 3.7 3.7  CL 103 106  --  105 103 102  CO2 25 28  --  23 25 26   GLUCOSE 103* 94  --  93 129* 96  BUN 30* 26*  --  23* 14 21*  CREATININE 1.17* 0.98  --  1.09* 1.09* 1.12*  CALCIUM 9.5 8.9  --  8.9 9.2 9.1  MG  --   --  2.0  --   --   --    Liver Function Tests:  Recent Labs Lab 11/22/14 0513  AST 21  ALT 19  ALKPHOS 36*  BILITOT 1.6*  PROT 4.8*  ALBUMIN 3.0*   No results for input(s): LIPASE, AMYLASE in the last 168 hours. No results for input(s): AMMONIA in the last 168 hours. CBC:  Recent Labs Lab 11/21/14 1620  11/23/14 1848 11/24/14 0422 11/25/14 1000 11/26/14 0550 11/27/14 1022  WBC 6.8  < > 5.5 6.1 5.5 5.2 7.7  NEUTROABS 5.0  --  3.6  --   --   --   --   HGB 7.5*  < > 8.9* 8.4* 8.4* 8.2* 10.3*  HCT 22.7*  < > 26.5* 24.0* 24.9* 24.4* 30.7*  MCV 96.2  < > 94.0 92.3 94.0 94.2 93.0  PLT 136*  < > 108* 98* 95* 106* 121*  < > = values in this interval not displayed. Cardiac Enzymes: No results for input(s): CKTOTAL, CKMB, CKMBINDEX, TROPONINI in the last 168 hours. BNP: BNP (last 3 results) No results for input(s): BNP in the last 8760 hours.  ProBNP (last 3 results) No results for input(s): PROBNP in the last 8760 hours.  CBG: No results for input(s): GLUCAP in the last 168 hours.     SignedNiel Hummer A  [facility] 11/27/2014, 3:29 PM

## 2014-11-27 NOTE — Progress Notes (Signed)
Daily Progress Note   Patient Name: [name]       Date: 11/27/2014 DOB: 05/19/1934  Age: 81 y.o. MRN#: [id number] Attending Physician: Elmarie Shiley, MD Primary Care Physician: [name], MD Admit Date: 11/21/2014   Subjective:     Ms. Vasconez is struggling with the thought of PAC placement. She acknowledges that this was her request but was not expecting this to be done so quickly. Discussed procedure including risks/benefits with attempts to reassure her and allow her a fully educated decision. She is just nervous about the procedure. She has plans to return to [facility] today after procedure and will spend some time in the rehab part before returning to ALF. I have recommended palliative care to follow there and at ALF as time will only tell how she will tolerate this duodenal mass and how severe the side effects/bleeding will effect her life. Support provided.    Length of Stay: none  Current Medications: Scheduled Meds:  . budesonide  0.5 mg Nebulization BID  . DULoxetine  30 mg Oral Daily  . famotidine  40 mg Oral QHS  . furosemide  40 mg Oral Daily  . levothyroxine  50 mcg Oral QAC breakfast  . mirtazapine  15 mg Oral QHS  . pantoprazole  40 mg Oral Q0600  . polyethylene glycol  17 g Oral Daily  . potassium chloride  40 mEq Oral Daily  . predniSONE  5 mg Oral Q breakfast  . senna-docusate  1 tablet Oral QHS    Continuous Infusions:    PRN Meds: acetaminophen **OR** acetaminophen, albuterol, ALPRAZolam, hydrALAZINE, sodium chloride  Palliative Performance Scale: 50%     Vital Signs: BP 140/79 mmHg  Pulse 84  Temp(Src) 98.4 F (36.9 C) (Oral)  Resp 18  Ht 5' 4.5" (1.638 m)  Wt 86.365 kg (190 lb 6.4 oz)  BMI 32.19 kg/m2  SpO2 100% SpO2: SpO2: 100 % O2 Device: O2 Device: Not Delivered O2 Flow Rate: O2 Flow Rate (L/min): 2 L/min  Intake/output summary:  Intake/Output Summary (Last 24 hours) at 11/27/14 1412 Last data filed at 11/27/14 1144  Gross per 24 hour  Intake 729.17 ml  Output      0 ml  Net 729.17 ml   LBM: Last BM Date: 11/27/14 Baseline Weight: Weight: 80.74 kg (178 lb) Most recent weight: Weight: 86.365 kg (190 lb 6.4 oz)  Physical Exam: General: NAD, sitting up in recliner, seems to have more energy, slightly anxious HEENT: [place]/AT, moist mucous membranes CVS: RRR Resp: SOB improved today Abd: Soft, NT, distended, colostomy Neuro: Awake, alert, oriented x 3   Additional Data Reviewed: Recent Labs     11/25/14  1000  11/26/14  0550  11/27/14  1022  WBC  5.5  5.2  7.7  HGB  8.4*  8.2*  10.3*  PLT  95*  106*  121*  NA  136  136   --   BUN  14  21*   --   CREATININE  1.09*  1.12*   --      Problem List:  Patient Active Problem List   Diagnosis Date Noted  . Poor venous access   . Palliative care encounter 11/26/2014  . Duodenal mass 11/24/2014  . Absolute anemia   . Occult blood in stools   . Acute blood loss anemia   . Acute GI bleeding   . Other emphysema   . Essential hypertension   . Acute renal failure syndrome   .  Depression   . Other specified hypothyroidism   . GI bleed 11/21/2014  . Anemia 11/21/2014  . Epistaxis 11/21/2014  . Renal insufficiency 11/21/2014     Palliative Care Assessment & Plan    Code Status:  DNR  Goals of Care:  Treat the treatable - most likely conservatively. Open to more comfort based options with decline.   3. Symptom Management:  Bowel regimen: Continue Senokot-S qhs (may increase to 2 tablets if ineffective).  Dyspnea: Baseline COPD and this is complicated by anemia. Continue treatment per primary team.   . Prognosis: Unable to determine  5. Discharge Planning: [facility] for rehab with Palliative care service follow-up   Care plan was discussed with Dr. Tyrell Antonio.   Thank you for allowing the Palliative Medicine Team to assist in the care of this patient.   Time In: 1150 Time Out: 1220 Total Time 55min Prolonged Time  Billed  no     Greater than 50%  of this time was spent counseling and coordinating care related to the above assessment and plan.     Vinie Sill, NP Palliative Medicine Team Pager # [phone] (M-F 8a-5p) Team Phone # [phone] (Nights/Weekends)  11/27/2014, 2:12 PM

## 2014-11-27 NOTE — Clinical Social Work Note (Signed)
Clinical Social Work Assessment  Patient Details  Name: [name] MRN: [id number] Date of Birth: Oct 29, 1933  Date of referral:  11/27/14               Reason for consult:  Facility Placement, Discharge Planning                Permission sought to share information with:  Family Supports, Customer service manager Permission granted to share information::  Yes, Verbal Permission Granted  Name::     [name]  Agency::  [facility]  Relationship::  Daughter / Designer, industrial/product Information:  [phone]  Housing/Transportation Living arrangements for the past 2 months:  [place] ([facility]) Source of Information:  Adult Children Patient Interpreter Needed:  None Criminal Activity/Legal Involvement Pertinent to Current Situation/Hospitalization:  No - Comment as needed Significant Relationships:  Adult Children Lives with:  Facility Resident Do you feel safe going back to the place where you live?  Yes Need for family participation in patient care:  Yes (Comment) (Daughter active in patient's care.)  Care giving concerns:  Patient's daughter expressed no concerns regarding discharge disposition.   Social Worker assessment / plan:  CSW received referral for possible SNF placement at time of discharge. CSW spoke with patient's daughter who confirmed family would prefer for patient to return to [facility] portion at time of discharge. CSW confirmed discharge plan with admissions liaison for [facility]. CSW to continue to follow and assist with discharge planning needs.  Employment status:  Retired Forensic scientist:  Medicare PT Recommendations:  Home with [place] / Referral to community resources:  [place]  Patient/Family's Response to care:  Patient's daughter understanding and agreeable to CSW plan of care.  Patient/Family's Understanding of and Emotional Response to Diagnosis, Current Treatment, and  Prognosis:  Patient's daughter understanding and agreeable to CSW plan of care.  Emotional Assessment Appearance:  Other (Comment Required (CSW spoke with patient's daughter.) Attitude/Demeanor/Rapport:  Other (CSW spoke with patient's daughter.) Affect (typically observed):  Other (CSW spoke with patient's daughter.) Orientation:  Oriented to Self, Oriented to Place, Oriented to  Time, Oriented to Situation Alcohol / Substance use:  Not Applicable Psych involvement (Current and /or in the community):  No (Comment) (Not appropriate at this time.)  Discharge Needs  Concerns to be addressed:  No discharge needs identified Readmission within the last 30 days:  No Current discharge risk:  None Barriers to Discharge:  No Barriers Identified   Caroline Sauger, LCSW 11/27/2014, 1:55 PM [phone]

## 2014-11-27 NOTE — Progress Notes (Signed)
Report was given to [facility] the nurse at [facility]

## 2014-11-27 NOTE — Progress Notes (Signed)
Occupational Therapy Treatment Patient Details Name: [name] MRN: [id number] DOB: [birth year] Today's Date: 11/27/2014    History of present illness Patient is a 81 y/o female with PMH of depression, sarcoma right hand, Dupuytrens Dz left hand, breast cancer, COPD on 5 mg prednisone daily, cardiac pacemaker in place, atrial fibrillation, anemia, diverticulosis with contained sigmoid perforation/abscess 2012, recurrent perforation leading to ex lap/sigmoid resection/end colostomy/gastrostomy 10/2011.C Diff in 2012., angiodysplasia of stomach, colon, vitamin B12 def , hypothyroidism admitted for weakess and anemia s/p sp 2 Units PRBC; EGD with friable duodenal polypoid mass, path pending   OT comments  Patient progressing towards OT goals. Pt overall supervision>mod I. Pt able to perform stand pivot transfers <> BSC safely. Pt continues to have decreased activity tolerance/endurance and will continue to benefit from energy conservation education. Educated pt on [place], see exercises below.    Follow Up Recommendations  No OT follow up;Supervision - Intermittent    Equipment Recommendations  None recommended by OT    Recommendations for Other Services  None at this time  Precautions / Restrictions Precautions Precautions: None Restrictions Weight Bearing Restrictions: No       Mobility Bed Mobility General bed mobility comments: Pt seated in recliner upon OT entering/exiting the room  Transfers Overall transfer level: Needs assistance Equipment used: None Transfers: Stand Pivot Transfers;Sit to/from Stand Sit to Stand: Modified independent (Device/Increase time) Stand pivot transfers: Modified independent (Device/Increase time) General transfer comment: Transfer <> BSC. Pt able to manage central line and IV pole independently.     Balance Overall balance assessment: Needs assistance Sitting-balance support: No upper extremity supported;Feet supported Sitting balance-Leahy Scale:  Good     Standing balance support: No upper extremity supported;During functional activity Standing balance-Leahy Scale: Fair   ADL Overall ADL's : Needs assistance/impaired General ADL Comments: Pt overall supervision>mod I. Pt able to transfer <> BSC beside recliner at mod I level, as she needs to go frequently with urgency. Continued energy conservation education and education on BUE HEP for overall strengthening and to help improve endurance. Pt on RA and 02 sats =86% after transfer, but with pursed lip breathing and rest break quickly increased to 92%. Continued to encouraged seated rest breaks and pursed lip breathing during all activities prn.      Cognition   Behavior During Therapy: WFL for tasks assessed/performed Overall Cognitive Status: Within Functional Limits for tasks assessed     Exercises General Exercises - Upper Extremity Shoulder Flexion: AROM;Both;Seated;10 reps Shoulder Extension: AROM;Both;10 reps;Seated Shoulder ABduction: AROM;10 reps;Seated;Both Shoulder ADduction: AROM;Both;10 reps;Seated Elbow Flexion: AROM;Both;10 reps;Seated Elbow Extension: Seated;AROM;Both;10 reps Wrist Flexion: AROM;Both;10 reps;Seated Wrist Extension: AROM;Both;10 reps;Seated           Pertinent Vitals/ Pain       Pain Assessment: No/denies pain         Frequency Min 2X/week     Progress Toward Goals  OT Goals(current goals can now be found in the care plan section)  Progress towards OT goals: Progressing toward goals     Plan Discharge plan remains appropriate    End of Session Equipment Utilized During Treatment: Rolling walker   Activity Tolerance Patient tolerated treatment well   Patient Left in chair;with call bell/phone within reach    Time: 0820-0853 OT Time Calculation (min): 33 min  Charges: OT General Charges $OT Visit: 1 Procedure OT Treatments $Self Care/Home Management : 8-22 mins $Therapeutic Exercise: 8-22 mins  Gryffin Altice , MS, OTR/L,  CLT Pager: [phone]  11/27/2014, 9:03 AM

## 2014-11-27 NOTE — Procedures (Signed)
Technically successful bedside removal or temporary R IJ approach CVC. Technically successful placement of temporary vascular sheath for IV access during port placement. Successful placement of right IJ approach port-a-cath with tip at the superior caval atrial junction. The catheter is ready for immediate use. No immediate post procedural complications.

## 2014-11-27 NOTE — H&P (Signed)
Chief Complaint: Chief Complaint  Patient presents with  . Anemia   Referring Physician(s): TRH  History of Present Illness: [name] is a 81 y.o. female who has a complex PMHX and requires multiple blood draws and iron and blood transfusions. IR received request for port a catheter for poor venous access. The patient will be following with Dr. Alvy Bimler. She denies any chest pain, change in chronic shortness of breath or palpitations. She denies any recent fever or chills. She has previously tolerated sedation without complications.    Past Medical History  Diagnosis Date  . COPD (chronic obstructive pulmonary disease)   . Asthma   . Presence of permanent cardiac pacemaker   . Hypertension   . Hypothyroidism   . Anxiety   . GERD (gastroesophageal reflux disease)   . Anemia   . Cancer     breast; finger  . PONV (postoperative nausea and vomiting)     Past Surgical History  Procedure Laterality Date  . Breast surgery      left breast mammosite surgery  . Insert / replace / remove pacemaker    . Appendectomy    . Abdominal hysterectomy    . Tonsillectomy    . Enteroscopy N/A 11/24/2014    Procedure: ENTEROSCOPY;  Surgeon: Ladene Artist, MD;  Location: [facility] ENDOSCOPY;  Service: Endoscopy;  Laterality: N/A;  . Colectomy with colostomy creation/hartmann procedure  2013  . Abdominal wound dehiscence      stratus mesh placed with g-tube    Allergies: Aspirin and Sulfa antibiotics  Medications: Prior to Admission medications   Medication Sig Start Date End Date Taking? Authorizing Provider  acetaminophen (TYLENOL) 325 MG tablet Take 650 mg by mouth at bedtime.    Yes Historical Provider, MD  albuterol (PROVENTIL) (2.5 MG/3ML) 0.083% nebulizer solution Take 2.5 mg by nebulization every 6 (six) hours as needed for wheezing or shortness of breath.   Yes Historical Provider, MD  ALPRAZolam (XANAX) 0.25 MG tablet Take 0.25 mg by mouth at bedtime. 11/18/14  Yes Historical  Provider, MD  budesonide (RHINOCORT AQUA) 32 MCG/ACT nasal spray Place 2 sprays into both nostrils daily.   Yes Historical Provider, MD  Carboxymethylcellulose Sodium (LUBRICANT EYE DROPS OP) Apply 1 drop to eye daily.   Yes Historical Provider, MD  dexlansoprazole (DEXILANT) 60 MG capsule Take 60 mg by mouth daily.   Yes Historical Provider, MD  DULoxetine (CYMBALTA) 30 MG capsule Take 30 mg by mouth at bedtime. 10/28/14  Yes Historical Provider, MD  Eszopiclone 3 MG TABS Take 3 mg by mouth daily as needed (sleep).  10/28/14  Yes Historical Provider, MD  guaiFENesin-dextromethorphan (ROBITUSSIN DM) 100-10 MG/5ML syrup Take 15 mLs by mouth every 4 (four) hours as needed for cough.   Yes Historical Provider, MD  hydrocortisone cream 1 % Apply 1 application topically daily as needed for itching.   Yes Historical Provider, MD  levothyroxine (SYNTHROID, LEVOTHROID) 50 MCG tablet Take 50 mcg by mouth daily. 09/22/14  Yes Historical Provider, MD  losartan (COZAAR) 50 MG tablet Take 50 mg by mouth daily. 10/25/14  Yes Historical Provider, MD  metoprolol succinate (TOPROL-XL) 50 MG 24 hr tablet Take 50 mg by mouth daily. 09/22/14  Yes Historical Provider, MD  mirtazapine (REMERON) 15 MG tablet Take 15 mg by mouth at bedtime.  09/16/14  Yes Historical Provider, MD  Multiple Vitamins-Minerals (MULTIVITAMIN & MINERAL PO) Take 1 tablet by mouth daily.   Yes Historical Provider, MD  mupirocin cream Drue Stager)  2 % Apply 1 application topically 2 (two) times daily.   Yes Historical Provider, MD  oxycodone (OXY-IR) 5 MG capsule Take 5 mg by mouth every 4 (four) hours as needed for pain.   Yes Historical Provider, MD  polyethylene glycol (MIRALAX / GLYCOLAX) packet Take 17 g by mouth daily as needed for mild constipation or moderate constipation.   Yes Historical Provider, MD  pravastatin (PRAVACHOL) 40 MG tablet Take 40 mg by mouth at bedtime.  10/26/14  Yes Historical Provider, MD  predniSONE (DELTASONE) 5 MG tablet Take 5  mg by mouth daily with breakfast.   Yes Historical Provider, MD  PROAIR HFA 108 (90 BASE) MCG/ACT inhaler Inhale 2 puffs into the lungs 2 (two) times daily.  11/11/14  Yes Historical Provider, MD  QVAR 80 MCG/ACT inhaler Inhale 3 puffs into the lungs 2 (two) times daily. 10/28/14  Yes Historical Provider, MD  ranitidine (ZANTAC) 300 MG tablet Take 300 mg by mouth at bedtime. 10/25/14  Yes Historical Provider, MD  traZODone (DESYREL) 50 MG tablet Take 50 mg by mouth at bedtime as needed for sleep.   Yes Historical Provider, MD  Vitamin D, Ergocalciferol, (DRISDOL) 50000 UNITS CAPS capsule Take 50,000 Units by mouth once a week. Every Sunday 10/25/14  Yes Historical Provider, MD  zafirlukast (ACCOLATE) 20 MG tablet Take 20 mg by mouth 2 (two) times daily. 09/22/14  Yes Historical Provider, MD    History reviewed. No pertinent family history.  History   Social History  . Marital Status: Married    Spouse Name: N/A  . Number of Children: N/A  . Years of Education: N/A   Social History Main Topics  . Smoking status: Former Research scientist (life sciences)  . Smokeless tobacco: Not on file  . Alcohol Use: 4.2 oz/week    0 Standard drinks or equivalent, 7 Glasses of wine per week  . Drug Use: No  . Sexual Activity: Not on file   Other Topics Concern  . None   Social History Narrative   Review of Systems: A 12 point ROS discussed and pertinent positives are indicated in the HPI above.  All other systems are negative.  Review of Systems  Vital Signs: BP 140/79 mmHg  Pulse 84  Temp(Src) 98.4 F (36.9 C) (Oral)  Resp 18  Ht 5' 4.5" (1.638 m)  Wt 190 lb 6.4 oz (86.365 kg)  BMI 32.19 kg/m2  SpO2 100%  Physical Exam  Constitutional: She is oriented to person, place, and time. No distress.  HENT:  Head: Normocephalic and atraumatic.  Neck:  Right IJ temp central line intact  Cardiovascular: Normal rate and regular rhythm.  Exam reveals no gallop and no friction rub.   No murmur heard. Pulmonary/Chest: Effort  normal and breath sounds normal. No respiratory distress. She has no wheezes. She has no rales.  Neurological: She is alert and oriented to person, place, and time.  Skin: She is not diaphoretic.  Left sided pacemaker intact  Psychiatric: She has a normal mood and affect. Her behavior is normal. Thought content normal.    Mallampati Score:  MD Evaluation Airway: WNL Heart: WNL Abdomen: WNL Chest/ Lungs: WNL ASA  Classification: 3 Mallampati/Airway Score: Two  Imaging: Dg Chest 1 View  11/26/2014   CLINICAL DATA:  Central line placement  EXAM: CHEST  1 VIEW  COMPARISON:  None.  FINDINGS: Right IJ catheter tip is in the cavoatrial junction. There is a left chest wall pacer device with leads in the right atrial appendage  and right ventricle. Normal heart size. The lung volumes are low. There is mild atelectasis in the left base. Calcified granulomas noted in the left upper lobe.  IMPRESSION: 1. Right IJ catheter tip is in the cavoatrial junction. No pneumothorax.   Electronically Signed   By: Kerby Moors M.D.   On: 11/26/2014 08:36    Labs:  CBC:  Recent Labs  11/24/14 0422 11/25/14 1000 11/26/14 0550 11/27/14 1022  WBC 6.1 5.5 5.2 7.7  HGB 8.4* 8.4* 8.2* 10.3*  HCT 24.0* 24.9* 24.4* 30.7*  PLT 98* 95* 106* 121*    COAGS:  Recent Labs  11/22/14 0513  INR 1.13    BMP:  Recent Labs  11/22/14 0513 11/24/14 0422 11/25/14 1000 11/26/14 0550  NA 140 138 136 136  K 3.6 3.6 3.7 3.7  CL 106 105 103 102  CO2 28 23 25 26   GLUCOSE 94 93 129* 96  BUN 26* 23* 14 21*  CALCIUM 8.9 8.9 9.2 9.1  CREATININE 0.98 1.09* 1.09* 1.12*  GFRNONAA 54* 47* 47* 46*  GFRAA >60 55* 55* 53*    LIVER FUNCTION TESTS:  Recent Labs  11/22/14 0513  BILITOT 1.6*  AST 21  ALT 19  ALKPHOS 36*  PROT 4.8*  ALBUMIN 3.0*   Assessment and Plan: Poor venous access GI bleed Anemia-multifactorial  History of breast cancer COPD Cardiac pacemaker in place Diverticulosis with  contained sigmoid perforation/abscess 2012, recurrent perforation leading to ex lap/sigmoid resection/end colostomy/gastrostomy 10/2011 Friable duodenal polypoid mass Request for image guided port a catheter placement with sedation for poor venous access and need of multiple blood draws and transfusions.  The patient has been NPO, no blood thinners taken, labs and vitals have been reviewed. Risks and Benefits discussed with the patient including, but not limited to bleeding, infection, pneumothorax, or fibrin sheath development and need for additional procedures. All of the patient's questions were answered, patient is agreeable to proceed. Consent signed and in chart.   Thank you for this interesting consult.  I greatly enjoyed meeting [name] and look forward to participating in their care.  SignedHedy Jacob 11/27/2014, 1:40 PM   I spent a total of 40 Minutes in face to face in clinical consultation, greater than 50% of which was counseling/coordinating care for poor venous access requiring multiple draw bloods and transfusions.

## 2014-11-28 ENCOUNTER — Encounter: Payer: Self-pay | Admitting: Cardiology

## 2014-12-01 ENCOUNTER — Telehealth: Payer: Self-pay | Admitting: Cardiology

## 2014-12-01 ENCOUNTER — Encounter: Payer: Medicare Other | Admitting: *Deleted

## 2014-12-01 NOTE — Telephone Encounter (Signed)
LMOVM reminding pt to send remote transmission.   

## 2014-12-02 ENCOUNTER — Ambulatory Visit (HOSPITAL_BASED_OUTPATIENT_CLINIC_OR_DEPARTMENT_OTHER): Payer: Medicare Other | Admitting: Hematology and Oncology

## 2014-12-02 ENCOUNTER — Other Ambulatory Visit (HOSPITAL_BASED_OUTPATIENT_CLINIC_OR_DEPARTMENT_OTHER): Payer: Medicare Other

## 2014-12-02 ENCOUNTER — Other Ambulatory Visit: Payer: Self-pay | Admitting: *Deleted

## 2014-12-02 ENCOUNTER — Encounter: Payer: Self-pay | Admitting: Cardiology

## 2014-12-02 ENCOUNTER — Encounter: Payer: Self-pay | Admitting: Hematology and Oncology

## 2014-12-02 ENCOUNTER — Telehealth: Payer: Self-pay | Admitting: Hematology and Oncology

## 2014-12-02 ENCOUNTER — Encounter: Payer: Self-pay | Admitting: Internal Medicine

## 2014-12-02 ENCOUNTER — Ambulatory Visit: Payer: Medicare Other | Admitting: Hematology and Oncology

## 2014-12-02 ENCOUNTER — Ambulatory Visit (HOSPITAL_COMMUNITY)
Admission: RE | Admit: 2014-12-02 | Discharge: 2014-12-02 | Disposition: A | Discharge status: Home / Self Care | Payer: Medicare Other | Source: Ambulatory Visit | Attending: Hematology and Oncology | Admitting: Hematology and Oncology

## 2014-12-02 VITALS — BP 112/71 | HR 65 | Temp 99.0°F | Resp 20 | Ht 64.5 in | Wt 178.3 lb

## 2014-12-02 DIAGNOSIS — Z853 Personal history of malignant neoplasm of breast: Secondary | ICD-10-CM

## 2014-12-02 DIAGNOSIS — D509 Iron deficiency anemia, unspecified: Secondary | ICD-10-CM | POA: Insufficient documentation

## 2014-12-02 DIAGNOSIS — Z95828 Presence of other vascular implants and grafts: Secondary | ICD-10-CM

## 2014-12-02 DIAGNOSIS — Z862 Personal history of diseases of the blood and blood-forming organs and certain disorders involving the immune mechanism: Secondary | ICD-10-CM

## 2014-12-02 DIAGNOSIS — D638 Anemia in other chronic diseases classified elsewhere: Secondary | ICD-10-CM | POA: Diagnosis not present

## 2014-12-02 DIAGNOSIS — K3189 Other diseases of stomach and duodenum: Secondary | ICD-10-CM

## 2014-12-02 DIAGNOSIS — K319 Disease of stomach and duodenum, unspecified: Secondary | ICD-10-CM

## 2014-12-02 DIAGNOSIS — D630 Anemia in neoplastic disease: Secondary | ICD-10-CM | POA: Insufficient documentation

## 2014-12-02 DIAGNOSIS — C50919 Malignant neoplasm of unspecified site of unspecified female breast: Secondary | ICD-10-CM | POA: Diagnosis not present

## 2014-12-02 HISTORY — DX: Other diseases of stomach and duodenum: K31.89

## 2014-12-02 LAB — CBC & DIFF AND RETIC
BASO%: 0.2 % (ref 0.0–2.0)
Basophils Absolute: 0 10*3/uL (ref 0.0–0.1)
EOS%: 2 % (ref 0.0–7.0)
Eosinophils Absolute: 0.1 10*3/uL (ref 0.0–0.5)
HCT: 24.2 % — ABNORMAL LOW (ref 34.8–46.6)
HEMOGLOBIN: 8 g/dL — AB (ref 11.6–15.9)
Immature Retic Fract: 10.1 % — ABNORMAL HIGH (ref 1.60–10.00)
LYMPH%: 17.2 % (ref 14.0–49.7)
MCH: 31.4 pg (ref 25.1–34.0)
MCHC: 33.1 g/dL (ref 31.5–36.0)
MCV: 94.9 fL (ref 79.5–101.0)
MONO#: 0.6 10*3/uL (ref 0.1–0.9)
MONO%: 9.7 % (ref 0.0–14.0)
NEUT#: 4.7 10*3/uL (ref 1.5–6.5)
NEUT%: 70.9 % (ref 38.4–76.8)
NRBC: 0 % (ref 0–0)
PLATELETS: 154 10*3/uL (ref 145–400)
RBC: 2.55 10*6/uL — AB (ref 3.70–5.45)
RDW: 15.8 % — AB (ref 11.2–14.5)
RETIC %: 3 % — AB (ref 0.70–2.10)
Retic Ct Abs: 76.5 10*3/uL (ref 33.70–90.70)
WBC: 6.6 10*3/uL (ref 3.9–10.3)
lymph#: 1.1 10*3/uL (ref 0.9–3.3)

## 2014-12-02 LAB — FERRITIN CHCC: FERRITIN: 459 ng/mL — AB (ref 9–269)

## 2014-12-02 LAB — PREPARE RBC (CROSSMATCH)

## 2014-12-02 LAB — HOLD TUBE, BLOOD BANK

## 2014-12-02 MED ORDER — HEPARIN SOD (PORK) LOCK FLUSH 100 UNIT/ML IV SOLN
500.0000 [IU] | Freq: Once | INTRAVENOUS | Status: AC
  Administered 2014-12-02: 500 [IU] via INTRAVENOUS
  Filled 2014-12-02: qty 5

## 2014-12-02 MED ORDER — SODIUM CHLORIDE 0.9 % IJ SOLN
10.0000 mL | INTRAMUSCULAR | Status: DC | PRN
  Filled 2014-12-02: qty 10

## 2014-12-02 NOTE — Telephone Encounter (Signed)
Pt confirmed labs/ov per 06/14 POF, gave pt AVS and Calendar.... KJ, sent msg to add 5 hour blood and sent msg to KD in radiation to contact pt with D/T for referral..Marland KitchenMarland Kitchen

## 2014-12-02 NOTE — Assessment & Plan Note (Signed)
Clinically, she has no signs of disease recurrence. I will continue to see her on a yearly basis with history, physical examination and recommend her to continue on mammogram.

## 2014-12-02 NOTE — Assessment & Plan Note (Addendum)
She has come firm melana and AVM in the past. Recent endoscopy revealed a duodenal mass. Unfortunately, biopsy was non diagnostic although malignancy is suspected. [facility] the [facility] was discussed with the patient and at present time she is leaning towards not undergoing aggressive surgical management. I will come for with my radiation oncology colleagues to see if palliative radiation might be an option.. Recent iron studies were unremarkable for severe iron deficiency. I will reserve to only give her IV iron if ferritin is less than 100

## 2014-12-02 NOTE — Progress Notes (Signed)
[facility] OFFICE PROGRESS NOTE  Patient Care Team: Prince Solian, MD as PCP - General Gatha Mayer, MD as Consulting Physician (Gastroenterology) Evans Lance, MD as Consulting Physician (Cardiology) Collene Gobble, MD as Consulting Physician (Pulmonary Disease) Jiles Prows, MD as Attending Physician ([place]) Heath Lark, MD as Consulting Physician (Hematology and Oncology)  SUMMARY OF ONCOLOGIC HISTORY:  This patient have remote history of breast cancer diagnosed from screening mammogram in 2003. She had surgery, radiation therapy and adjuvant tamoxifen. 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 She had received B12 injection for B12 deficiency and intravenous iron for iron deficiency anemia. The patient had history of allergic reaction to intravenous iron dextran. She received intravenous iron feraheme in November 7341 without complications. In May 2016, she was found to have melanoma and significant GI bleed. On 11/24/2014, EGD and small bowel endoscopy revealed gastritis, AVM and duodenal mass. Biopsy showed nonmalignant tubular adenoma.Accession: [id number] CT scan of the abdomen dated 11/13/2014 show no retroperitoneal or other acute hemorrhage. Descending colostomy with peristomal hernia containing nonobstructed bowel. Possible cirrhosis She have recurrent admission to the hospital for severe anemia.  INTERVAL HISTORY: Please see below for problem oriented charting. The patient denies any recent signs or symptoms of bleeding such as spontaneous epistaxis, hematuria or hematochezia. She complained of fatigue. She has extensive bruises. Her stoma appears to be functioning well. I reviewed her hospital records extensively.  REVIEW OF SYSTEMS:   Constitutional: Denies fevers, chills or abnormal weight loss Eyes: Denies blurriness of vision Ears, nose, mouth, throat, and face: Denies  mucositis or sore throat Respiratory: Denies cough, dyspnea or wheezes Cardiovascular: Denies palpitation, chest discomfort or lower extremity swelling Gastrointestinal:  Denies nausea, heartburn or change in bowel habits Skin: Denies abnormal skin rashes Lymphatics: Denies new lymphadenopathy  Neurological:Denies numbness, tingling or new weaknesses Behavioral/Psych: Mood is stable, no new changes  All other systems were reviewed with the patient and are negative.  I have reviewed the past medical history, past surgical history, social history and family history with the patient and they are unchanged from previous note.  ALLERGIES:  is allergic to aspirin; sulfonamide derivatives; ibuprofen; iron dextran; sulfa antibiotics; tape; and metoclopramide hcl.  MEDICATIONS:  Current Outpatient Prescriptions  Medication Sig Dispense Refill  . acetaminophen (TYLENOL) 325 MG tablet Take 650 mg by mouth every 6 (six) hours as needed for mild pain.     [name] albuterol (PROVENTIL HFA;VENTOLIN HFA) 108 (90 BASE) MCG/ACT inhaler Inhale 2 puffs into the lungs 2 (two) times daily.    [name] albuterol (PROVENTIL) (2.5 MG/3ML) 0.083% nebulizer solution Take 2.5 mg by nebulization every 4 (four) hours as needed for wheezing or shortness of breath.    . ALPRAZolam (XANAX) 0.25 MG tablet Take 0.25 mg by mouth at bedtime.     . baclofen (LIORESAL) 10 MG tablet Take 10 mg by mouth every 8 (eight) hours as needed for muscle spasms.     . beclomethasone (QVAR) 80 MCG/ACT inhaler Inhale 3 puffs into the lungs 2 (two) times daily.     . Calcium Carbonate-Vitamin D 600-400 MG-UNIT per tablet Take 1 tablet by mouth daily.    [name] DEXILANT 60 MG capsule Take 60 mg by mouth daily before breakfast.  3  . diphenhydrAMINE (BENADRYL) 25 mg capsule Take 25 mg by mouth daily as needed for itching.     . DULoxetine (CYMBALTA) 30 MG capsule Take 30  mg by mouth daily.    [name] EPINEPHrine (EPIPEN IJ) Inject 1 application as directed as needed.      . gabapentin (NEURONTIN) 100 MG capsule Take 100 mg by mouth daily.    [name] guaiFENesin-dextromethorphan (ROBITUSSIN DM) 100-10 MG/5ML syrup Take 15 mLs by mouth 2 (two) times daily as needed for cough.    . hydrocortisone cream 1 % Apply 1 application topically once as needed for itching.    [name] ketoconazole (NIZORAL) 2 % cream Apply topically every morning. Apply to affected area (rash).  6  . levothyroxine (SYNTHROID, LEVOTHROID) 50 MCG tablet Take 50 mcg by mouth daily.  3  . losartan (COZAAR) 50 MG tablet Take 50 mg by mouth daily.    . metoprolol succinate (TOPROL-XL) 50 MG 24 hr tablet TAKE 1 TABLET BY MOUTH EVERY DAY 90 tablet 1  . mirtazapine (REMERON) 15 MG tablet Take 15 mg by mouth daily.  3  . Multiple Vitamin (MULTIVITAMIN WITH MINERALS) TABS Take 1 tablet by mouth daily.    . mupirocin ointment (BACTROBAN) 2 % Place 1 application into the nose 2 (two) times daily. 22 g 0  . oxyCODONE (OXY IR/ROXICODONE) 5 MG immediate release tablet Take 5-10 mg by mouth every 4 (four) hours as needed (for cough / pain).     . polyethylene glycol (MIRALAX / GLYCOLAX) packet Take 8.5 g by mouth daily.     . Polyvinyl Alcohol (LIQUID TEARS OP) Apply 2 drops to eye 2 (two) times daily.     . pravastatin (PRAVACHOL) 40 MG tablet Take 40 mg by mouth daily.    . predniSONE (DELTASONE) 5 MG tablet Take 5 mg by mouth daily with breakfast.     . ranitidine (ZANTAC) 300 MG tablet Take 300 mg by mouth daily.     [name] senna-docusate (SENOKOT-S) 8.6-50 MG per tablet Take 1 tablet by mouth at bedtime.     . traZODone (DESYREL) 50 MG tablet Take 50 mg by mouth as needed for sleep.     . Vitamin D, Ergocalciferol, (DRISDOL) 50000 UNITS CAPS Take 50,000 Units by mouth every 7 (seven) days. On Fridays    . XOLAIR 150 MG injection Inject 150 mg into the skin every 28 (twenty-eight) days.     . zafirlukast (ACCOLATE) 20 MG tablet Take 20 mg by mouth 2 (two) times daily.     Current Facility-Administered Medications   Medication Dose Route Frequency Provider Last Rate Last Dose  . sodium chloride 0.9 % injection 10 mL  10 mL Intravenous PRN Heath Lark, MD        PHYSICAL EXAMINATION: ECOG PERFORMANCE STATUS: 2 - Symptomatic, <50% confined to bed  Filed Vitals:   12/02/14 1314  BP: 112/71  Pulse: 65  Temp: 99 F (37.2 C)  Resp: 20   Filed Weights   12/02/14 1314  Weight: 178 lb 4.8 oz (80.876 kg)    GENERAL:alert, no distress and comfortable. She is obese and crushingoid, sitting on the wheelchair with oxygen SKIN: skin color, texture, turgor are normal, no rashes or significant lesions. She has extensive bruises EYES: normal, Conjunctiva are pale and non-injected, sclera clear OROPHARYNX:no exudate, no erythema and lips, buccal mucosa, and tongue normal  Musculoskeletal:no cyanosis of digits and no clubbing  NEURO: alert & oriented x 3 with fluent speech, no focal motor/sensory deficits  LABORATORY DATA:  I have reviewed the data as listed    Component Value Date/Time   NA 140 11/14/2014 0629   NA 142  06/06/2013 1343   K 4.2 11/14/2014 0629   K 4.9 06/06/2013 1343   CL 108 11/14/2014 0629   CO2 23 11/14/2014 0629   CO2 29 06/06/2013 1343   GLUCOSE 93 11/14/2014 0629   GLUCOSE 81 06/06/2013 1343   BUN 27* 11/14/2014 0629   BUN 44.2* 06/06/2013 1343   CREATININE 1.17* 11/14/2014 0629   CREATININE 1.0 06/06/2013 1343   CALCIUM 9.1 11/14/2014 0629   CALCIUM 10.4 06/06/2013 1343   PROT 5.8* 11/12/2014 0720   PROT 6.4 06/06/2013 1343   ALBUMIN 3.7 11/12/2014 0720   ALBUMIN 3.8 06/06/2013 1343   AST 26 11/12/2014 0720   AST 30 06/06/2013 1343   ALT 24 11/12/2014 0720   ALT 29 06/06/2013 1343   ALKPHOS 39 11/12/2014 0720   ALKPHOS 69 06/06/2013 1343   BILITOT 0.5 11/12/2014 0720   BILITOT 0.38 06/06/2013 1343   GFRNONAA 43* 11/14/2014 0629   GFRAA 50* 11/14/2014 0629    No results found for: SPEP, UPEP  Lab Results  Component Value Date   WBC 6.6 12/02/2014   NEUTROABS  4.7 12/02/2014   HGB 8.0* 12/02/2014   HCT 24.2* 12/02/2014   MCV 94.9 12/02/2014   PLT 154 12/02/2014      Chemistry      Component Value Date/Time   NA 140 11/14/2014 0629   NA 142 06/06/2013 1343   K 4.2 11/14/2014 0629   K 4.9 06/06/2013 1343   CL 108 11/14/2014 0629   CO2 23 11/14/2014 0629   CO2 29 06/06/2013 1343   BUN 27* 11/14/2014 0629   BUN 44.2* 06/06/2013 1343   CREATININE 1.17* 11/14/2014 0629   CREATININE 1.0 06/06/2013 1343      Component Value Date/Time   CALCIUM 9.1 11/14/2014 0629   CALCIUM 10.4 06/06/2013 1343   ALKPHOS 39 11/12/2014 0720   ALKPHOS 69 06/06/2013 1343   AST 26 11/12/2014 0720   AST 30 06/06/2013 1343   ALT 24 11/12/2014 0720   ALT 29 06/06/2013 1343   BILITOT 0.5 11/12/2014 0720   BILITOT 0.38 06/06/2013 1343       RADIOGRAPHIC STUDIES: I reviewed her most recent CT scan I have personally reviewed the radiological images as listed and agreed with the findings in the report.   ASSESSMENT & PLAN:  Malignant neoplasm of female breast Clinically, she has no signs of disease recurrence. I will continue to see her on a yearly basis with history, physical examination and recommend her to continue on mammogram.    Iron deficiency anemia She has come firm melana and AVM in the past. Recent endoscopy revealed a duodenal mass. Unfortunately, biopsy was non diagnostic although malignancy is suspected. Second opinion at the tertiary teaching hospital was discussed with the patient and at present time she is leaning towards not undergoing aggressive surgical management. I will come for with my radiation oncology colleagues to see if palliative radiation might be an option.. Recent iron studies were unremarkable for severe iron deficiency. I will reserve to only give her IV iron if ferritin is less than 100  Duodenal mass As mentioned above, I will consult radiation oncologist for potential palliative radiation. I suspect this is the  cause of her GI bleed.  Anemia in chronic illness The absence of significant reticulocytosis in the presence of recent GI bleed suggested component of anemia of chronic disease. Recent serum vitamin B-12 and iron studies were adequate. I will check erythropoietin level. If serum erythropoietin is less than 500,  I will proceed with darbepoetin injection when I see her back in 2 weeks. We discussed some of the risks, benefits, and alternatives of blood transfusions. The patient is symptomatic from anemia and the hemoglobin level is critically low.  Some of the side-effects to be expected including risks of transfusion reactions, chills, infection, syndrome of volume overload and risk of hospitalization from various reasons and the patient is willing to proceed and went ahead to sign consent today.    Orders Placed This Encounter  Procedures  . Erythropoietin    Standing Status: Future     Number of Occurrences: 1     Standing Expiration Date: 01/06/2016  . CBC & Diff and Retic    Standing Status: Standing     Number of Occurrences: 9     Standing Expiration Date: 12/02/2015  . Comprehensive metabolic panel    Standing Status: Standing     Number of Occurrences: 3     Standing Expiration Date: 12/02/2015  . Ambulatory referral to Radiation Oncology    Referral Priority:  Routine    Referral Type:  Consultation    Referral Reason:  Specialty Services Required    Requested Specialty:  Radiation Oncology    Number of Visits Requested:  1  . Hold Tube, Blood Bank    Standing Status: Standing     Number of Occurrences: 9     Standing Expiration Date: 12/02/2015   All questions were answered. The patient knows to call the clinic with any problems, questions or concerns. No barriers to learning was detected. I spent 30 minutes counseling the patient face to face. The total time spent in the appointment was 40 minutes and more than 50% was on counseling and review of test results      [facility], [place], MD 12/02/2014 2:43 PM

## 2014-12-02 NOTE — Assessment & Plan Note (Signed)
As mentioned above, I will consult radiation oncologist for potential palliative radiation. I suspect this is the cause of her GI bleed.

## 2014-12-02 NOTE — Assessment & Plan Note (Addendum)
The absence of significant reticulocytosis in the presence of recent GI bleed suggested component of anemia of chronic disease. Recent serum vitamin B-12 and iron studies were adequate. I will check erythropoietin level. If serum erythropoietin is less than 500, I will proceed with darbepoetin injection when I see her back in 2 weeks. We discussed some of the risks, benefits, and alternatives of blood transfusions. The patient is symptomatic from anemia and the hemoglobin level is critically low.  Some of the side-effects to be expected including risks of transfusion reactions, chills, infection, syndrome of volume overload and risk of hospitalization from various reasons and the patient is willing to proceed and went ahead to sign consent today.

## 2014-12-03 ENCOUNTER — Telehealth: Payer: Self-pay | Admitting: Hematology and Oncology

## 2014-12-03 ENCOUNTER — Ambulatory Visit (HOSPITAL_BASED_OUTPATIENT_CLINIC_OR_DEPARTMENT_OTHER): Payer: Medicare Other

## 2014-12-03 VITALS — BP 129/56 | HR 65 | Temp 98.9°F | Resp 18

## 2014-12-03 DIAGNOSIS — D509 Iron deficiency anemia, unspecified: Secondary | ICD-10-CM

## 2014-12-03 DIAGNOSIS — K3189 Other diseases of stomach and duodenum: Secondary | ICD-10-CM

## 2014-12-03 DIAGNOSIS — D638 Anemia in other chronic diseases classified elsewhere: Secondary | ICD-10-CM

## 2014-12-03 DIAGNOSIS — C50919 Malignant neoplasm of unspecified site of unspecified female breast: Secondary | ICD-10-CM

## 2014-12-03 MED ORDER — HEPARIN SOD (PORK) LOCK FLUSH 100 UNIT/ML IV SOLN
500.0000 [IU] | Freq: Every day | INTRAVENOUS | Status: AC | PRN
  Administered 2014-12-03: 500 [IU]
  Filled 2014-12-03: qty 5

## 2014-12-03 MED ORDER — SODIUM CHLORIDE 0.9 % IV SOLN
250.0000 mL | Freq: Once | INTRAVENOUS | Status: AC
  Administered 2014-12-03: 250 mL via INTRAVENOUS

## 2014-12-03 MED ORDER — SODIUM CHLORIDE 0.9 % IJ SOLN
10.0000 mL | INTRAMUSCULAR | Status: AC | PRN
  Administered 2014-12-03: 10 mL
  Filled 2014-12-03: qty 10

## 2014-12-03 NOTE — Telephone Encounter (Signed)
Lft msg for pt confirming schedule, mailed out schedule... KJ

## 2014-12-03 NOTE — Patient Instructions (Signed)

## 2014-12-04 LAB — TYPE AND SCREEN
ABO/RH(D): A POS
Antibody Screen: NEGATIVE
Unit division: 0
Unit division: 0

## 2014-12-04 LAB — ERYTHROPOIETIN: Erythropoietin: 61.6 m[IU]/mL — ABNORMAL HIGH (ref 2.6–18.5)

## 2014-12-04 NOTE — Progress Notes (Addendum)
GI Location of Tumor / Histology: mass in duodenal   [name] presented  months ago with symptoms of: GI Bleeding  Biopsies of  (if applicable) revealed: Diagnosis 11/24/14: Duodenum, Biopsy, mass (D3) - TUBULAR ADENOMA. - HIGH GRADE DYSPLASIA IS NOT IDENTIFIED.  Past/Anticipated interventions by surgeon, if any: Enteroscopy,Dr. Clayborne Dana Stark,MD 11/24/14  Past/Anticipated interventions by medical oncology, if any: Dr. Alvy Bimler possible palliative radiation   Weight changes, if any: none,stable  Bowel/Bladder complaints, if any: Colectomy with colostomy creation/hartmann procedure 2013,abdominl dehiscence stratus mesh placed with g-tube(removed too many months to heal) wears Oxygen 2 liters N/C constipation takes miralax q 3 days  Nausea / Vomiting, if any: NO  Pain issues, if any:  No  SAFETY ISSUES:yes  Prior radiation? Yes,2003 breast cancer, mammosite Dr. Marylene Buerger, MD retired, [place], radiation in [facility]   Pacemaker/ICD? YES, Dr. Lovena Le,  Possible current pregnancy? NO  Is the patient on methotrexate? 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

## 2014-12-09 ENCOUNTER — Encounter: Payer: Self-pay | Admitting: *Deleted

## 2014-12-10 ENCOUNTER — Ambulatory Visit
Admission: RE | Admit: 2014-12-10 | Discharge: 2014-12-10 | Disposition: A | Discharge status: Home / Self Care | Payer: Medicare Other | Source: Ambulatory Visit | Attending: Radiation Oncology | Admitting: Radiation Oncology

## 2014-12-10 ENCOUNTER — Other Ambulatory Visit (HOSPITAL_BASED_OUTPATIENT_CLINIC_OR_DEPARTMENT_OTHER): Payer: Medicare Other

## 2014-12-10 ENCOUNTER — Ambulatory Visit (HOSPITAL_BASED_OUTPATIENT_CLINIC_OR_DEPARTMENT_OTHER): Payer: Medicare Other

## 2014-12-10 ENCOUNTER — Telehealth: Payer: Self-pay | Admitting: Gastroenterology

## 2014-12-10 ENCOUNTER — Other Ambulatory Visit: Payer: Self-pay | Admitting: Hematology and Oncology

## 2014-12-10 ENCOUNTER — Telehealth: Payer: Self-pay | Admitting: Hematology and Oncology

## 2014-12-10 ENCOUNTER — Other Ambulatory Visit: Payer: Self-pay

## 2014-12-10 ENCOUNTER — Encounter: Payer: Self-pay | Admitting: Radiation Oncology

## 2014-12-10 VITALS — BP 108/47 | HR 66 | Temp 99.7°F | Resp 20 | Ht 64.5 in | Wt 175.5 lb

## 2014-12-10 VITALS — BP 136/45 | HR 64 | Temp 98.4°F | Resp 18

## 2014-12-10 DIAGNOSIS — Z853 Personal history of malignant neoplasm of breast: Secondary | ICD-10-CM | POA: Diagnosis present

## 2014-12-10 DIAGNOSIS — Z452 Encounter for adjustment and management of vascular access device: Secondary | ICD-10-CM | POA: Diagnosis present

## 2014-12-10 DIAGNOSIS — D638 Anemia in other chronic diseases classified elsewhere: Secondary | ICD-10-CM | POA: Diagnosis not present

## 2014-12-10 DIAGNOSIS — D509 Iron deficiency anemia, unspecified: Secondary | ICD-10-CM | POA: Insufficient documentation

## 2014-12-10 DIAGNOSIS — C50919 Malignant neoplasm of unspecified site of unspecified female breast: Secondary | ICD-10-CM | POA: Diagnosis not present

## 2014-12-10 DIAGNOSIS — D500 Iron deficiency anemia secondary to blood loss (chronic): Secondary | ICD-10-CM

## 2014-12-10 DIAGNOSIS — K319 Disease of stomach and duodenum, unspecified: Secondary | ICD-10-CM | POA: Diagnosis not present

## 2014-12-10 DIAGNOSIS — K3189 Other diseases of stomach and duodenum: Secondary | ICD-10-CM

## 2014-12-10 DIAGNOSIS — Z95828 Presence of other vascular implants and grafts: Secondary | ICD-10-CM

## 2014-12-10 DIAGNOSIS — Z862 Personal history of diseases of the blood and blood-forming organs and certain disorders involving the immune mechanism: Secondary | ICD-10-CM | POA: Diagnosis not present

## 2014-12-10 LAB — COMPREHENSIVE METABOLIC PANEL (CC13)
ALT: 32 U/L (ref 0–55)
ANION GAP: 5 meq/L (ref 3–11)
AST: 26 U/L (ref 5–34)
Albumin: 3.7 g/dL (ref 3.5–5.0)
Alkaline Phosphatase: 51 U/L (ref 40–150)
BUN: 33 mg/dL — ABNORMAL HIGH (ref 7.0–26.0)
CALCIUM: 9.9 mg/dL (ref 8.4–10.4)
CHLORIDE: 107 meq/L (ref 98–109)
CO2: 30 mEq/L — ABNORMAL HIGH (ref 22–29)
Creatinine: 1 mg/dL (ref 0.6–1.1)
EGFR: 52 mL/min/{1.73_m2} — AB (ref >90)
GLUCOSE: 99 mg/dL (ref 70–140)
Potassium: 4 mEq/L (ref 3.5–5.1)
Sodium: 142 mEq/L (ref 136–145)
TOTAL PROTEIN: 5.5 g/dL — AB (ref 6.4–8.3)
Total Bilirubin: 0.78 mg/dL (ref 0.20–1.20)

## 2014-12-10 LAB — CBC & DIFF AND RETIC
BASO%: 0.2 % (ref 0.0–2.0)
BASOS ABS: 0 10*3/uL (ref 0.0–0.1)
EOS%: 2.2 % (ref 0.0–7.0)
Eosinophils Absolute: 0.1 10*3/uL (ref 0.0–0.5)
HCT: 28.9 % — ABNORMAL LOW (ref 34.8–46.6)
HGB: 9.5 g/dL — ABNORMAL LOW (ref 11.6–15.9)
IMMATURE RETIC FRACT: 6.5 % (ref 1.60–10.00)
LYMPH%: 21.9 % (ref 14.0–49.7)
MCH: 30.8 pg (ref 25.1–34.0)
MCHC: 32.9 g/dL (ref 31.5–36.0)
MCV: 93.8 fL (ref 79.5–101.0)
MONO#: 0.4 10*3/uL (ref 0.1–0.9)
MONO%: 8.2 % (ref 0.0–14.0)
NEUT#: 3.6 10*3/uL (ref 1.5–6.5)
NEUT%: 67.5 % (ref 38.4–76.8)
Platelets: 95 10*3/uL — ABNORMAL LOW (ref 145–400)
RBC: 3.08 10*6/uL — ABNORMAL LOW (ref 3.70–5.45)
RDW: 15.5 % — AB (ref 11.2–14.5)
RETIC CT ABS: 52.36 10*3/uL (ref 33.70–90.70)
Retic %: 1.7 % (ref 0.70–2.10)
WBC: 5.4 10*3/uL (ref 3.9–10.3)
lymph#: 1.2 10*3/uL (ref 0.9–3.3)
nRBC: 0 % (ref 0–0)

## 2014-12-10 LAB — HOLD TUBE, BLOOD BANK

## 2014-12-10 MED ORDER — SODIUM CHLORIDE 0.9 % IJ SOLN
10.0000 mL | INTRAMUSCULAR | Status: AC | PRN
  Administered 2014-12-10: 10 mL
  Filled 2014-12-10: qty 10

## 2014-12-10 MED ORDER — SODIUM CHLORIDE 0.9 % IJ SOLN
10.0000 mL | INTRAMUSCULAR | Status: DC | PRN
  Administered 2014-12-10 (×2): 10 mL via INTRAVENOUS
  Filled 2014-12-10: qty 10

## 2014-12-10 MED ORDER — HEPARIN SOD (PORK) LOCK FLUSH 100 UNIT/ML IV SOLN
500.0000 [IU] | INTRAVENOUS | Status: AC | PRN
  Administered 2014-12-10: 500 [IU]
  Filled 2014-12-10: qty 5

## 2014-12-10 NOTE — Telephone Encounter (Signed)
[name], Happy to help.  I'll get her in.  [name], She needs EGD, +MAC, next available MAC Thursday, to repeat biopsy of duodenum and to label the site with clips.  See below.  Thanks

## 2014-12-10 NOTE — Patient Instructions (Addendum)

## 2014-12-10 NOTE — Progress Notes (Signed)
Radiation Oncology         [phone] ________________________________  Name: Tanessa Tidd Yohe MRN: [id number]  Date: 12/10/2014  DOB: Mar 02, 1934  IH:[id number],VZDGLOVFIE R, MD  Heath Lark, MD     REFERRING PHYSICIAN: Heath Lark, MD   DIAGNOSIS: There were no encounter diagnoses.   HISTORY OF PRESENT ILLNESS::Laurabeth H Dapolito is a 81 y.o. female who is seen for an initial consultation visit regarding the patient's diagnosis of a duodenal mass.nThe patient presented with a prior medical history of breast cancer in 2003 and symptoms of GI bleeding. She had a colectomy with colostomy creation/hartmann procedure in 2013. An abdominal dehiscence stratus mesh was placed with a g-tube, but was removed because it took too many months to heal. She wears Oxygen 2 liters nasal cannula and is noted to have a cough. She has constipation and takes miralax q 3 days. The pt has a history of anemia and had some recent blood transfusions. Pt notes dark brown stools, but not black.  The patient underwent upper endoscopy given a ongoing GI bleed. 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 A tubular adenoma was present according to the final pathology. She has continued to experience a GI bleed. This case was discussed in multidisciplinary GI conference. The tumor is felt to remain suspicious for possible cancer. This likely represents a primary process rather than metastatic disease.  PREVIOUS RADIATION THERAPY: Yes. Breast cancer in 2003 ([facility])   PAST MEDICAL HISTORY:  has a past medical history of Asthma; Presence of permanent cardiac pacemaker; Hypertension; Hypothyroidism; Anxiety; Cancer; PONV (postoperative nausea and vomiting); Insomnia, unspecified; Osteoarthrosis, unspecified whether generalized or localized, unspecified site; Unspecified hypothyroidism; Unspecified essential hypertension; Atrial fibrillation; Sciatica; Anemia; COPD (chronic obstructive pulmonary  disease); UTI (urinary tract infection); GERD (gastroesophageal reflux disease); Pneumonia; Asthma; Clostridium difficile colitis; Diverticulitis of sigmoid colon (11/2010); Dementia; Dehydration with hyponatremia (12/2010); Angiodysplasia of stomach; Angiodysplasia of colon; Vitamin B12 deficiency (dietary) anemia (04/12/2011); Shingles; Sarcoma; Malignant neoplasm of breast (female), unspecified site; Dupuytren's disease; Vitamin B12 deficiency (04/02/2012); Complication of anesthesia; Pacemaker; Gastric mass (12/02/2014); and Renal insufficiency (11/21/2014).     PAST SURGICAL HISTORY: Past Surgical History  Procedure Laterality Date  . Breast surgery      left breast mammosite surgery  . Insert / replace / remove pacemaker    . Enteroscopy N/A 11/24/2014    Procedure: ENTEROSCOPY;  Surgeon: Ladene Artist, MD;  Location: [facility] ENDOSCOPY;  Service: Endoscopy;  Laterality: N/A;  . Colectomy with colostomy creation/hartmann procedure  2013  . Abdominal wound dehiscence      stratus mesh placed with g-tube  . Nasal sinus surgery      x 3  . Appendectomy    . Hernia repair    . Abdominal hysterectomy    . Pacemaker insertion    . Eye surgery      x6-bilateral  . Finger amputation      right  . Foot surgery      right toes x 3  . Tonsillectomy    . Colonoscopy  01/2009    w/ablation of 3 mm AVM, diverticulosis, external hemorrhoids  . Upper gastrointestinal endoscopy  01/2009    w/polypectomy, AVM, hyperplastic polyp  . Abdominal hysterectomy    . Laparotomy  11/12/2011    Procedure: EXPLORATORY LAPAROTOMY;  Surgeon: Madilyn Hook, DO;  Location: [facility] ORS;  Service: General;  Laterality: N/A;  . Colostomy revision  11/12/2011    Procedure: COLON RESECTION  SIGMOID;  Surgeon: Madilyn Hook, DO;  Location: [facility] ORS;  Service: General;  Laterality: N/A;  . Colostomy  11/12/2011    Procedure: COLOSTOMY;  Surgeon: Madilyn Hook, DO;  Location: [facility];  Service: General;  Laterality: Left;  End colostomy  .  Laparotomy  11/19/2011    Procedure: EXPLORATORY LAPAROTOMY;  Surgeon: Shann Medal, MD;  Location: [facility];  Service: General;  Laterality: N/A;  . Gastrostomy  11/19/2011    Procedure: GASTROSTOMY;  Surgeon: Shann Medal, MD;  Location: [facility];  Service: General;;  INSERTION GASTROSTOMY TUBE  . Application of wound vac  11/19/2011    Procedure: APPLICATION OF WOUND VAC;  Surgeon: Shann Medal, MD;  Location: [facility];  Service: General;  Laterality: N/A;     FAMILY HISTORY: family history includes Breast cancer in her cousin and maternal aunt; Cancer (age of onset: 85) in her father; Colon cancer in her cousin and mother; Emphysema in her paternal uncle; Heart disease in her mother; Pancreatic cancer in her father; Prostate cancer in her cousin; Tuberculosis in her paternal uncle; Uterine cancer in her cousin.   SOCIAL HISTORY:  reports that she quit smoking about 37 years ago. Her smoking use included Cigarettes. She has a 10.5 pack-year smoking history. She has never used smokeless tobacco. She reports that she drinks about 4.2 oz of alcohol per week. She reports that she does not use illicit drugs.   ALLERGIES: Aspirin; Sulfonamide derivatives; Aspirin; Ibuprofen; Iron dextran; Sulfa antibiotics; Sulfa antibiotics; Tape; and Metoclopramide hcl   MEDICATIONS:  Current Outpatient Prescriptions  Medication Sig Dispense Refill  . acetaminophen (TYLENOL) 325 MG tablet Take 650 mg by mouth every 6 (six) hours as needed for mild pain.     [name] acetaminophen (TYLENOL) 325 MG tablet Take 650 mg by mouth at bedtime.     [name] albuterol (PROVENTIL HFA;VENTOLIN HFA) 108 (90 BASE) MCG/ACT inhaler Inhale 2 puffs into the lungs 2 (two) times daily.    [name] albuterol (PROVENTIL) (2.5 MG/3ML) 0.083% nebulizer solution Take 2.5 mg by nebulization every 4 (four) hours as needed for wheezing or shortness of breath.    [name] albuterol (PROVENTIL) (2.5 MG/3ML) 0.083% nebulizer solution Take 2.5 mg by nebulization every 6  (six) hours as needed for wheezing or shortness of breath.    . ALPRAZolam (XANAX) 0.25 MG tablet Take 0.25 mg by mouth at bedtime.     . ALPRAZolam (XANAX) 0.25 MG tablet Take 0.25 mg by mouth at bedtime.    . baclofen (LIORESAL) 10 MG tablet Take 10 mg by mouth every 8 (eight) hours as needed for muscle spasms.     . beclomethasone (QVAR) 80 MCG/ACT inhaler Inhale 3 puffs into the lungs 2 (two) times daily.     . budesonide (RHINOCORT AQUA) 32 MCG/ACT nasal spray Place 2 sprays into both nostrils daily.    . Calcium Carbonate-Vitamin D 600-400 MG-UNIT per tablet Take 1 tablet by mouth daily.    . Carboxymethylcellulose Sodium (LUBRICANT EYE DROPS OP) Apply 1 drop to eye daily.    [name] DEXILANT 60 MG capsule Take 60 mg by mouth daily before breakfast.  3  . dexlansoprazole (DEXILANT) 60 MG capsule Take 60 mg by mouth daily.    . diphenhydrAMINE (BENADRYL) 25 mg capsule Take 25 mg by mouth daily as needed for itching.     . DULoxetine (CYMBALTA) 30 MG capsule Take 30 mg by mouth daily.    . DULoxetine (CYMBALTA) 30 MG capsule Take 30  mg by mouth at bedtime.  3  . EPINEPHrine (EPIPEN IJ) Inject 1 application as directed as needed.     . Eszopiclone 3 MG TABS Take 3 mg by mouth daily as needed (sleep).   2  . gabapentin (NEURONTIN) 100 MG capsule Take 100 mg by mouth daily.    [name] guaiFENesin-dextromethorphan (ROBITUSSIN DM) 100-10 MG/5ML syrup Take 15 mLs by mouth 2 (two) times daily as needed for cough.    [name] guaiFENesin-dextromethorphan (ROBITUSSIN DM) 100-10 MG/5ML syrup Take 15 mLs by mouth every 4 (four) hours as needed for cough.    . hydrocortisone cream 1 % Apply 1 application topically once as needed for itching.    . hydrocortisone cream 1 % Apply 1 application topically daily as needed for itching.    [name] ketoconazole (NIZORAL) 2 % cream Apply topically every morning. Apply to affected area (rash).  6  . levothyroxine (SYNTHROID, LEVOTHROID) 50 MCG tablet Take 50 mcg by mouth daily.  3  .  levothyroxine (SYNTHROID, LEVOTHROID) 50 MCG tablet Take 50 mcg by mouth daily.  3  . losartan (COZAAR) 50 MG tablet Take 50 mg by mouth daily.    . metoprolol succinate (TOPROL-XL) 50 MG 24 hr tablet TAKE 1 TABLET BY MOUTH EVERY DAY 90 tablet 1  . metoprolol succinate (TOPROL-XL) 50 MG 24 hr tablet Take 50 mg by mouth daily.  1  . mirtazapine (REMERON) 15 MG tablet Take 15 mg by mouth daily.  3  . mirtazapine (REMERON) 15 MG tablet Take 15 mg by mouth at bedtime.   3  . Multiple Vitamin (MULTIVITAMIN WITH MINERALS) TABS Take 1 tablet by mouth daily.    . Multiple Vitamins-Minerals (MULTIVITAMIN & MINERAL PO) Take 1 tablet by mouth daily.    . mupirocin cream (BACTROBAN) 2 % Apply 1 application topically 2 (two) times daily.    . mupirocin ointment (BACTROBAN) 2 % Place 1 application into the nose 2 (two) times daily. 22 g 0  . oxyCODONE (OXY IR/ROXICODONE) 5 MG immediate release tablet Take 5-10 mg by mouth every 4 (four) hours as needed (for cough / pain).     [name] oxycodone (OXY-IR) 5 MG capsule Take 5 mg by mouth every 4 (four) hours as needed for pain.    . polyethylene glycol (MIRALAX / GLYCOLAX) packet Take 8.5 g by mouth daily.     . polyethylene glycol (MIRALAX / GLYCOLAX) packet Take 17 g by mouth daily as needed for mild constipation or moderate constipation.    . Polyvinyl Alcohol (LIQUID TEARS OP) Apply 2 drops to eye 2 (two) times daily.     . pravastatin (PRAVACHOL) 40 MG tablet Take 40 mg by mouth daily.    . pravastatin (PRAVACHOL) 40 MG tablet Take 40 mg by mouth at bedtime.   2  . predniSONE (DELTASONE) 5 MG tablet Take 5 mg by mouth daily with breakfast.     . predniSONE (DELTASONE) 5 MG tablet Take 5 mg by mouth daily with breakfast.    . PROAIR HFA 108 (90 BASE) MCG/ACT inhaler Inhale 2 puffs into the lungs 2 (two) times daily.     [name] QVAR 80 MCG/ACT inhaler Inhale 3 puffs into the lungs 2 (two) times daily.  2  . ranitidine (ZANTAC) 300 MG tablet Take 300 mg by mouth daily.      . ranitidine (ZANTAC) 300 MG tablet Take 300 mg by mouth at bedtime.  3  . senna-docusate (SENOKOT-S) 8.6-50 MG per tablet Take 1  tablet by mouth at bedtime.     . traZODone (DESYREL) 50 MG tablet Take 50 mg by mouth as needed for sleep.     . traZODone (DESYREL) 50 MG tablet Take 50 mg by mouth at bedtime as needed for sleep.    . Vitamin D, Ergocalciferol, (DRISDOL) 50000 UNITS CAPS capsule Take 50,000 Units by mouth once a week. Every Sunday  3  . Vitamin D, Ergocalciferol, (DRISDOL) 50000 UNITS CAPS Take 50,000 Units by mouth every 7 (seven) days. On Fridays    . XOLAIR 150 MG injection Inject 150 mg into the skin every 28 (twenty-eight) days.     . zafirlukast (ACCOLATE) 20 MG tablet Take 20 mg by mouth 2 (two) times daily.    . zafirlukast (ACCOLATE) 20 MG tablet Take 20 mg by mouth 2 (two) times daily.  3   No current facility-administered medications for this encounter.     REVIEW OF SYSTEMS:  A 15 point review of systems is documented in the electronic medical record. This was obtained by the nursing staff. However, I reviewed this with the patient to discuss relevant findings and make appropriate changes.  Pertinent items are noted in HPI.    PHYSICAL EXAM:  height is 5' 4.5" (1.638 m) and weight is 175 lb 8 oz (79.606 kg). Her oral temperature is 99.7 F (37.6 C). Her blood pressure is 108/47 and her pulse is 66. Her respiration is 20 and oxygen saturation is 96%.    General: Well-developed, in no acute distress HEENT: Normocephalic, atraumatic; oral cavity clear Neck: Supple without any lymphadenopathy Cardiovascular: Regular rate and rhythm Respiratory: Clear to auscultation bilaterally GI: Soft, nontender, normal bowel sounds Extremities: No edema present Neuro: No focal deficits Chest: Clear to auscultation bilaterally, with no rhonchi, wheezes, or rales.  ECOG = 1-2  0 - Asymptomatic (Fully active, able to carry on all predisease activities without restriction)  1  - Symptomatic but completely ambulatory (Restricted in physically strenuous activity but ambulatory and able to carry out work of a light or sedentary nature. For example, light housework, office work)  2 - Symptomatic, <50% in bed during the day (Ambulatory and capable of all self care but unable to carry out any work activities. Up and about more than 50% of waking hours)  3 - Symptomatic, >50% in bed, but not bedbound (Capable of only limited self-care, confined to bed or chair 50% or more of waking hours)  4 - Bedbound (Completely disabled. Cannot carry on any self-care. Totally confined to bed or chair)  5 - Death   Eustace Pen MM, Creech RH, Tormey DC, et al. [phone]). "Toxicity and response criteria of the [facility]". [place] Oncol. 5 (6): 649-55  LABORATORY DATA:  Lab Results  Component Value Date   WBC 5.4 12/10/2014   HGB 9.5* 12/10/2014   HCT 28.9* 12/10/2014   MCV 93.8 12/10/2014   PLT 95* 12/10/2014   Lab Results  Component Value Date   NA 142 12/10/2014   K 4.0 12/10/2014   CL 102 11/26/2014   CO2 30* 12/10/2014   Lab Results  Component Value Date   ALT 32 12/10/2014   AST 26 12/10/2014   ALKPHOS 51 12/10/2014   BILITOT 0.78 12/10/2014      RADIOGRAPHY: Ct Abdomen Pelvis Wo Contrast  11/13/2014   CLINICAL DATA:  Anemia and easy bruising. Rule out retroperitoneal bleed.  EXAM: CT ABDOMEN AND PELVIS WITHOUT CONTRAST  TECHNIQUE: Multidetector CT imaging of  the abdomen and pelvis was performed following the standard protocol without IV contrast.  COMPARISON:  12/04/2011  FINDINGS: BODY WALL: Parastomal hernia in the left lower quadrant containing nonobstructed bowel.  LOWER CHEST: Dependent atelectasis and scarring. Dual-chamber pacer leads. No acute findings.  ABDOMEN/PELVIS:  Liver: Scattered low-density foci in the liver compatible with cysts. Prominent hepatic fissures and caudate lobe with subtle surface nodularity.  Biliary: No evidence of  biliary obstruction or stone.  Pancreas: Unremarkable.  Spleen: Granulomatous changes.  Adrenals: Unremarkable.  Kidneys and ureters: No hydronephrosis or stone. Stable exophytic cyst from the upper pole left kidney measuring 28 mm. 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  Bladder: Unremarkable.  Reproductive: Hysterectomy and probable oophorectomies.  Bowel: No obstruction. Descending colostomy with Hartman's pouch. Peristomal hernia containing nonobstructed small bowel.  Retroperitoneum: No mass or adenopathy.  Peritoneum: No ascites or pneumoperitoneum.  Vascular: No acute abnormality.  OSSEOUS: Left sacral ala and bilateral obturator ring insufficiency fractures which are remote and healed. There is advanced lumbar degenerative disc disease with multilevel translation and scoliosis. Grade 1 anterolisthesis at L5-S1.  IMPRESSION: 1. No retroperitoneal or other acute hemorrhage. 2. Descending colostomy with peristomal hernia containing nonobstructed bowel. 3. Possible cirrhosis.  Correlate for risk factors.   Electronically Signed   By: Monte Fantasia M.D.   On: 11/13/2014 21:37   Dg Chest 1 View  11/26/2014   CLINICAL DATA:  Central line placement  EXAM: CHEST  1 VIEW  COMPARISON:  None.  FINDINGS: Right IJ catheter tip is in the cavoatrial junction. There is a left chest wall pacer device with leads in the right atrial appendage and right ventricle. Normal heart size. The lung volumes are low. There is mild atelectasis in the left base. Calcified granulomas noted in the left upper lobe.  IMPRESSION: 1. Right IJ catheter tip is in the cavoatrial junction. No pneumothorax.   Electronically Signed   By: Kerby Moors M.D.   On: 11/26/2014 08:36   Dg Chest 2 View  11/11/2014   CLINICAL DATA:  Nose please 6 hours. Decreased red blood cells and hemoglobin.  EXAM: CHEST  2 VIEW  COMPARISON:  11/13/2012 and 11/08/2012  FINDINGS: Left-sided pacemaker is intact and unchanged. Lungs are adequately inflated as  patient is slightly rotated to the right. There is no focal consolidation or effusion. Cardiomediastinal silhouette is within normal. There is a stable calcified granuloma over the left upper lobe. Old right mid lateral rib fractures. Degenerative changes of the spine are present.  IMPRESSION: No active cardiopulmonary disease.   Electronically Signed   By: Marin Olp M.D.   On: 11/11/2014 20:54   Ir Fluoro Guide Cv Line Right  11/27/2014   INDICATION: History of poor venous access and chronic anemia, in need of frequent intravenous access for iron infusion and blood transfusions. Please place Port a catheter for durable intravenous access.  EXAM: 1. BEDSIDE REMOVAL OF TEMPORARY RIGHT INTERNAL JUGULAR APPROACH TEMPORARY CENTRAL VENOUS CATHETER. 2. ULTRASOUND-GUIDED VENIPUNCTURE BY MD. 3. IMPLANTED PORT A CATH PLACEMENT WITH ULTRASOUND AND FLUOROSCOPIC GUIDANCE  COMPARISON:  None.  MEDICATIONS: Ancef 2 gm IV; The antibiotic was administered within an appropriate time interval prior to skin puncture.  ANESTHESIA/SEDATION: Versed 1 mg IV; Fentanyl 50 mcg IV;  Total Moderate Sedation Time  20  minutes.  CONTRAST:  None  FLUOROSCOPY TIME:  48 seconds (75.9 mGy)  COMPLICATIONS: None immediate  PROCEDURE: The procedure, risks, benefits, and alternatives were explained to the patient. Questions regarding  the procedure were encouraged and answered. The patient understands and consents to the procedure.  The patient's existing right internal jugular approach temporary central venous catheter was removed and hemostasis was achieved with manual compression.  Next, attention was paid towards placement of a temporary micropuncture sheath for intravenous access during the [facility] a catheter placement. As such, the the right upper arm was prepped with chlorhexidine in a sterile fashion, and a sterile drape was applied covering the operative field. Local anesthesia was provided with 1% lidocaine. Under direct ultrasound guidance,  the right brachial vein was accessed with a micropuncture kit after the overlying soft tissues were anesthetized with 1% lidocaine. An ultrasound image was saved for documentation purposes. The micropuncture sheath easily aspirated and flushed and was secured in place. A dressing was placed.  The right neck and chest were prepped with chlorhexidine in a sterile fashion, and a sterile drape was applied covering the operative field. Maximum barrier sterile technique with sterile gowns and gloves were used for the procedure. A timeout was performed prior to the initiation of the procedure. Local anesthesia was provided with 1% lidocaine with epinephrine.  After creating a small venotomy incision, a micropuncture kit was utilized to access the internal jugular vein under direct, real-time ultrasound guidance. Ultrasound image documentation was performed. The microwire was kinked to measure appropriate catheter length.  A subcutaneous port pocket was then created along the upper chest wall utilizing a combination of sharp and blunt dissection. The pocket was irrigated with sterile saline. A single lumen ISP power injectable port was chosen for placement. The 8 Fr catheter was tunneled from the port pocket site to the venotomy incision. The port was placed in the pocket and secured in place with 2 interrupted 2-0 sutures. The external catheter was trimmed to appropriate length. At the venotomy, an 8 Fr peel-away sheath was placed over a guidewire under fluoroscopic guidance. The catheter was then placed through the sheath and the sheath was removed. Final catheter positioning was confirmed and documented with a fluoroscopic spot radiograph. The port was accessed with a Huber needle, aspirated and flushed with heparinized saline.  The venotomy site was closed with an interrupted 4-0 Vicryl suture. The port pocket incision was closed with interrupted 2-0 Vicryl suture and the skin was opposed with a running subcuticular 4-0  Vicryl suture. Dermabond and Steri-strips were applied to both incisions. Dressings were placed. The patient tolerated the procedure well without immediate post procedural complication.  FINDINGS: After catheter placement, the tip lies within the superior cavoatrial junction. The Port a catheter catheter aspirates and flushes normally and is ready for immediate use.  IMPRESSION: 1. Successful placement of a right internal jugular approach power injectable Port-A-Cath. The catheter is ready for immediate use. 2. Successful placement of a temporary micropuncture sheath via the right brachial vein for intravenous access for medication administration during the Port a catheter placement. 3. Successful bedside removal of temporary right internal jugular approach central venous catheter.   Electronically Signed   By: Sandi Mariscal M.D.   On: 11/27/2014 18:03   Ir US Guide Vasc Access Right  11/27/2014   INDICATION: History of poor venous access and chronic anemia, in need of frequent intravenous access for iron infusion and blood transfusions. Please place Port a catheter for durable intravenous access.  EXAM: 1. BEDSIDE REMOVAL OF TEMPORARY RIGHT INTERNAL JUGULAR APPROACH TEMPORARY CENTRAL VENOUS CATHETER. 2. ULTRASOUND-GUIDED VENIPUNCTURE BY MD. 3. IMPLANTED PORT A CATH PLACEMENT WITH ULTRASOUND AND FLUOROSCOPIC GUIDANCE  COMPARISON:  None.  MEDICATIONS: Ancef 2 gm IV; The antibiotic was administered within an appropriate time interval prior to skin puncture.  ANESTHESIA/SEDATION: Versed 1 mg IV; Fentanyl 50 mcg IV;  Total Moderate Sedation Time  20  minutes.  CONTRAST:  None  FLUOROSCOPY TIME:  48 seconds (57.2 mGy)  COMPLICATIONS: None immediate  PROCEDURE: The procedure, risks, benefits, and alternatives were explained to the patient. Questions regarding the procedure were encouraged and answered. The patient understands and consents to the procedure.  The patient's existing right internal jugular approach temporary  central venous catheter was removed and hemostasis was achieved with manual compression.  Next, attention was paid towards placement of a temporary micropuncture sheath for intravenous access during the [facility] a catheter placement. As such, the the right upper arm was prepped with chlorhexidine in a sterile fashion, and a sterile drape was applied covering the operative field. Local anesthesia was provided with 1% lidocaine. Under direct ultrasound guidance, the right brachial vein was accessed with a micropuncture kit after the overlying soft tissues were anesthetized with 1% lidocaine. An ultrasound image was saved for documentation purposes. The micropuncture sheath easily aspirated and flushed and was secured in place. A dressing was placed.  The right neck and chest were prepped with chlorhexidine in a sterile fashion, and a sterile drape was applied covering the operative field. Maximum barrier sterile technique with sterile gowns and gloves were used for the procedure. A timeout was performed prior to the initiation of the procedure. Local anesthesia was provided with 1% lidocaine with epinephrine.  After creating a small venotomy incision, a micropuncture kit was utilized to access the internal jugular vein under direct, real-time ultrasound guidance. Ultrasound image documentation was performed. The microwire was kinked to measure appropriate catheter length.  A subcutaneous port pocket was then created along the upper chest wall utilizing a combination of sharp and blunt dissection. The pocket was irrigated with sterile saline. A single lumen ISP power injectable port was chosen for placement. The 8 Fr catheter was tunneled from the port pocket site to the venotomy incision. The port was placed in the pocket and secured in place with 2 interrupted 2-0 sutures. The external catheter was trimmed to appropriate length. At the venotomy, an 8 Fr peel-away sheath was placed over a guidewire under fluoroscopic  guidance. The catheter was then placed through the sheath and the sheath was removed. Final catheter positioning was confirmed and documented with a fluoroscopic spot radiograph. The port was accessed with a Huber needle, aspirated and flushed with heparinized saline.  The venotomy site was closed with an interrupted 4-0 Vicryl suture. The port pocket incision was closed with interrupted 2-0 Vicryl suture and the skin was opposed with a running subcuticular 4-0 Vicryl suture. Dermabond and Steri-strips were applied to both incisions. Dressings were placed. The patient tolerated the procedure well without immediate post procedural complication.  FINDINGS: After catheter placement, the tip lies within the superior cavoatrial junction. The Port a catheter catheter aspirates and flushes normally and is ready for immediate use.  IMPRESSION: 1. Successful placement of a right internal jugular approach power injectable Port-A-Cath. The catheter is ready for immediate use. 2. Successful placement of a temporary micropuncture sheath via the right brachial vein for intravenous access for medication administration during the Port a catheter placement. 3. Successful bedside removal of temporary right internal jugular approach central venous catheter.   Electronically Signed   By: Sandi Mariscal M.D.   On: 11/27/2014 18:03  Ir US Guide Vasc Access Right  11/27/2014   INDICATION: History of poor venous access and chronic anemia, in need of frequent intravenous access for iron infusion and blood transfusions. Please place Port a catheter for durable intravenous access.  EXAM: 1. BEDSIDE REMOVAL OF TEMPORARY RIGHT INTERNAL JUGULAR APPROACH TEMPORARY CENTRAL VENOUS CATHETER. 2. ULTRASOUND-GUIDED VENIPUNCTURE BY MD. 3. IMPLANTED PORT A CATH PLACEMENT WITH ULTRASOUND AND FLUOROSCOPIC GUIDANCE  COMPARISON:  None.  MEDICATIONS: Ancef 2 gm IV; The antibiotic was administered within an appropriate time interval prior to skin puncture.   ANESTHESIA/SEDATION: Versed 1 mg IV; Fentanyl 50 mcg IV;  Total Moderate Sedation Time  20  minutes.  CONTRAST:  None  FLUOROSCOPY TIME:  48 seconds (75.9 mGy)  COMPLICATIONS: None immediate  PROCEDURE: The procedure, risks, benefits, and alternatives were explained to the patient. Questions regarding the procedure were encouraged and answered. The patient understands and consents to the procedure.  The patient's existing right internal jugular approach temporary central venous catheter was removed and hemostasis was achieved with manual compression.  Next, attention was paid towards placement of a temporary micropuncture sheath for intravenous access during the [facility] a catheter placement. As such, the the right upper arm was prepped with chlorhexidine in a sterile fashion, and a sterile drape was applied covering the operative field. Local anesthesia was provided with 1% lidocaine. Under direct ultrasound guidance, the right brachial vein was accessed with a micropuncture kit after the overlying soft tissues were anesthetized with 1% lidocaine. An ultrasound image was saved for documentation purposes. The micropuncture sheath easily aspirated and flushed and was secured in place. A dressing was placed.  The right neck and chest were prepped with chlorhexidine in a sterile fashion, and a sterile drape was applied covering the operative field. Maximum barrier sterile technique with sterile gowns and gloves were used for the procedure. A timeout was performed prior to the initiation of the procedure. Local anesthesia was provided with 1% lidocaine with epinephrine.  After creating a small venotomy incision, a micropuncture kit was utilized to access the internal jugular vein under direct, real-time ultrasound guidance. Ultrasound image documentation was performed. The microwire was kinked to measure appropriate catheter length.  A subcutaneous port pocket was then created along the upper chest wall utilizing a  combination of sharp and blunt dissection. The pocket was irrigated with sterile saline. A single lumen ISP power injectable port was chosen for placement. The 8 Fr catheter was tunneled from the port pocket site to the venotomy incision. The port was placed in the pocket and secured in place with 2 interrupted 2-0 sutures. The external catheter was trimmed to appropriate length. At the venotomy, an 8 Fr peel-away sheath was placed over a guidewire under fluoroscopic guidance. The catheter was then placed through the sheath and the sheath was removed. Final catheter positioning was confirmed and documented with a fluoroscopic spot radiograph. The port was accessed with a Huber needle, aspirated and flushed with heparinized saline.  The venotomy site was closed with an interrupted 4-0 Vicryl suture. The port pocket incision was closed with interrupted 2-0 Vicryl suture and the skin was opposed with a running subcuticular 4-0 Vicryl suture. Dermabond and Steri-strips were applied to both incisions. Dressings were placed. The patient tolerated the procedure well without immediate post procedural complication.  FINDINGS: After catheter placement, the tip lies within the superior cavoatrial junction. The Port a catheter catheter aspirates and flushes normally and is ready for immediate use.  IMPRESSION: 1. Successful placement  of a right internal jugular approach power injectable Port-A-Cath. The catheter is ready for immediate use. 2. Successful placement of a temporary micropuncture sheath via the right brachial vein for intravenous access for medication administration during the Port a catheter placement. 3. Successful bedside removal of temporary right internal jugular approach central venous catheter.   Electronically Signed   By: Sandi Mariscal M.D.   On: 11/27/2014 18:03   Ir Radiology Peripheral Guided Iv Start  11/27/2014   INDICATION: History of poor venous access and chronic anemia, in need of frequent  intravenous access for iron infusion and blood transfusions. Please place Port a catheter for durable intravenous access.  EXAM: 1. BEDSIDE REMOVAL OF TEMPORARY RIGHT INTERNAL JUGULAR APPROACH TEMPORARY CENTRAL VENOUS CATHETER. 2. ULTRASOUND-GUIDED VENIPUNCTURE BY MD. 3. IMPLANTED PORT A CATH PLACEMENT WITH ULTRASOUND AND FLUOROSCOPIC GUIDANCE  COMPARISON:  None.  MEDICATIONS: Ancef 2 gm IV; The antibiotic was administered within an appropriate time interval prior to skin puncture.  ANESTHESIA/SEDATION: Versed 1 mg IV; Fentanyl 50 mcg IV;  Total Moderate Sedation Time  20  minutes.  CONTRAST:  None  FLUOROSCOPY TIME:  48 seconds (73.4 mGy)  COMPLICATIONS: None immediate  PROCEDURE: The procedure, risks, benefits, and alternatives were explained to the patient. Questions regarding the procedure were encouraged and answered. The patient understands and consents to the procedure.  The patient's existing right internal jugular approach temporary central venous catheter was removed and hemostasis was achieved with manual compression.  Next, attention was paid towards placement of a temporary micropuncture sheath for intravenous access during the [facility] a catheter placement. As such, the the right upper arm was prepped with chlorhexidine in a sterile fashion, and a sterile drape was applied covering the operative field. Local anesthesia was provided with 1% lidocaine. Under direct ultrasound guidance, the right brachial vein was accessed with a micropuncture kit after the overlying soft tissues were anesthetized with 1% lidocaine. An ultrasound image was saved for documentation purposes. The micropuncture sheath easily aspirated and flushed and was secured in place. A dressing was placed.  The right neck and chest were prepped with chlorhexidine in a sterile fashion, and a sterile drape was applied covering the operative field. Maximum barrier sterile technique with sterile gowns and gloves were used for the procedure. A  timeout was performed prior to the initiation of the procedure. Local anesthesia was provided with 1% lidocaine with epinephrine.  After creating a small venotomy incision, a micropuncture kit was utilized to access the internal jugular vein under direct, real-time ultrasound guidance. Ultrasound image documentation was performed. The microwire was kinked to measure appropriate catheter length.  A subcutaneous port pocket was then created along the upper chest wall utilizing a combination of sharp and blunt dissection. The pocket was irrigated with sterile saline. A single lumen ISP power injectable port was chosen for placement. The 8 Fr catheter was tunneled from the port pocket site to the venotomy incision. The port was placed in the pocket and secured in place with 2 interrupted 2-0 sutures. The external catheter was trimmed to appropriate length. At the venotomy, an 8 Fr peel-away sheath was placed over a guidewire under fluoroscopic guidance. The catheter was then placed through the sheath and the sheath was removed. Final catheter positioning was confirmed and documented with a fluoroscopic spot radiograph. The port was accessed with a Huber needle, aspirated and flushed with heparinized saline.  The venotomy site was closed with an interrupted 4-0 Vicryl suture. The port pocket incision was closed  with interrupted 2-0 Vicryl suture and the skin was opposed with a running subcuticular 4-0 Vicryl suture. Dermabond and Steri-strips were applied to both incisions. Dressings were placed. The patient tolerated the procedure well without immediate post procedural complication.  FINDINGS: After catheter placement, the tip lies within the superior cavoatrial junction. The Port a catheter catheter aspirates and flushes normally and is ready for immediate use.  IMPRESSION: 1. Successful placement of a right internal jugular approach power injectable Port-A-Cath. The catheter is ready for immediate use. 2. Successful  placement of a temporary micropuncture sheath via the right brachial vein for intravenous access for medication administration during the Port a catheter placement. 3. Successful bedside removal of temporary right internal jugular approach central venous catheter.   Electronically Signed   By: Sandi Mariscal M.D.   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 Pt's case was discussed at GI conference today. A repeat endoscopy with biopsy will be done with clips placed at the site of the tumor. Also discussed at the GI conference was embolization and radiation treatment.  PLAN: The pt has an esophagogastroduodenoscopy scheduled on 12/25/14. We will discuss treatment options pending the results of the procedure.     This document serves as a record of services personally performed by Kyung Rudd, MD. It was created on his behalf by Darcus Austin, a trained medical scribe. The creation of this record is based on the scribe's personal observations and the provider's statements to them. This document has been checked and approved by the attending provider.     ________________________________   Jodelle Gross, MD, PhD

## 2014-12-10 NOTE — Progress Notes (Signed)
Patient does not need blood today and she has not needed her oxygen.  Patient taken to lobby in wheelchair and given lunch.  She prefers to wait there until her appt. With radiation therapy.  Volunteer will watch over her and get her assistance should she need it.

## 2014-12-10 NOTE — Telephone Encounter (Signed)
Instructions have been mailed to the home.

## 2014-12-10 NOTE — Telephone Encounter (Signed)
-----   Message from Jerene Bears, MD sent at 12/10/2014  2:31 PM EDT ----- Linna Hoff, If you don't mind repeating the enteroscopy for repeat biopsies, I would greatly appreciate it. Looking at my schedule for hospital time, I think your availability on 12/25/2014 is likely before I would be able to get it done. Let me know if this doesn't work and I very much appreciate your help. [name]  ----- Message -----    From: Ladene Artist, MD    Sent: 12/10/2014   2:16 PM      To: Milus Banister, MD, Kyung Rudd, MD, #  Linna Hoff,   This is [name] Pyrtle's patient that I saw during my hospital week. I am forwarding this to [facility].  Norberto Sorenson    ----- Message -----    From: Milus Banister, MD    Sent: 12/10/2014   7:58 AM      To: Ladene Artist, MD, Kyung Rudd, MD  Norberto Sorenson, Her case was discussed at GI tumor board today.  Recommended that the duodenal mass be rebiopsied, hope to prove with pathology that it is indeed a cancer.  Also at same time could you place 1 or 2 endoclips at the site. Apparently she is continuing to bleed from the tumor. IR feels they can offer embolization to treat the bleeding and endoclips will help them target their embolization.  I'm out of town next week but  I'd be happy to help on the first Thursday of July (7th) during one of my Thursday spots.    Kyung Rudd from radiation oncology will be seeing her today, informing her of the above ideas, plan.  Radonna Ricker

## 2014-12-10 NOTE — Telephone Encounter (Signed)
Pt has been scheduled for 12/25/14 for EGD

## 2014-12-10 NOTE — Telephone Encounter (Signed)
pt came by to adv was unaware of appt being moved up-adv call & letter sent out-adv lab & inf pt arrived early

## 2014-12-10 NOTE — Telephone Encounter (Signed)
Left message on machine to call back  

## 2014-12-11 ENCOUNTER — Telehealth: Payer: Self-pay | Admitting: *Deleted

## 2014-12-11 NOTE — Telephone Encounter (Signed)
CALLED PATIENT TO INFORM OF APPT. ON 12-31-14, LVM FOR A RETURN CALL

## 2014-12-11 NOTE — Addendum Note (Signed)
Encounter addended by: Doreen Beam, RN on: 12/11/2014  8:54 AM<BR>     Documentation filed: Charges VN, Visit Diagnoses

## 2014-12-11 NOTE — Telephone Encounter (Signed)
EGD scheduled, pt instructed and medications reviewed.  Patient instructions mailed to home.  Patient to call with any questions or concerns.  

## 2014-12-12 ENCOUNTER — Telehealth: Payer: Self-pay | Admitting: Internal Medicine

## 2014-12-12 NOTE — Telephone Encounter (Signed)
Spoke with patient about device reaching RRT 12/01/14. Confirmed appt with Dr. Lovena Le 12/23/14. Patient appreciative of phone cal.

## 2014-12-12 NOTE — Telephone Encounter (Signed)
New problem    Pt will be doing a pacer check today and wanted to let the device clinic know.

## 2014-12-15 ENCOUNTER — Telehealth: Payer: Self-pay | Admitting: Internal Medicine

## 2014-12-15 NOTE — Telephone Encounter (Signed)
New Message       Pt's daughter calling to speak to [name]. Wants to make Dr. Lovena Le aware of some health issues that have been going on with the pt prior to her appt in 12/23/14. Please call back and advise.

## 2014-12-15 NOTE — Telephone Encounter (Signed)
Calle and spoke with patient's daughter and she has been having weekly transfusions for bleeding.  Having an repeat Endoscopy Thurs 12/25/14 to re look at mass that was seen in the duodenum.   She is also having lots of lower edema swelling.  She just wanted [name] to know all that is going on prior to her appointment as we can make a decision about her PPM generator change

## 2014-12-16 ENCOUNTER — Telehealth: Payer: Self-pay | Admitting: *Deleted

## 2014-12-16 NOTE — Telephone Encounter (Signed)
CALLED PATIENT TO GIVE ANSWER TO QUESTION, DIRECTED TO DR. MOODY, SPOKE WITH PATIENT AND SHE IS AWARE OF THE BIOPSY BEING ARRANGED BY DR. Ardis Hughs OFFICE

## 2014-12-17 ENCOUNTER — Encounter: Payer: Self-pay | Admitting: Hematology and Oncology

## 2014-12-17 ENCOUNTER — Other Ambulatory Visit (HOSPITAL_BASED_OUTPATIENT_CLINIC_OR_DEPARTMENT_OTHER): Payer: Medicare Other

## 2014-12-17 ENCOUNTER — Ambulatory Visit (HOSPITAL_BASED_OUTPATIENT_CLINIC_OR_DEPARTMENT_OTHER): Payer: Medicare Other | Admitting: Hematology and Oncology

## 2014-12-17 ENCOUNTER — Encounter (HOSPITAL_COMMUNITY): Payer: Self-pay | Admitting: *Deleted

## 2014-12-17 ENCOUNTER — Telehealth: Payer: Self-pay | Admitting: Hematology and Oncology

## 2014-12-17 ENCOUNTER — Ambulatory Visit: Payer: Medicare Other

## 2014-12-17 ENCOUNTER — Ambulatory Visit: Payer: Self-pay

## 2014-12-17 VITALS — BP 147/56 | HR 65 | Temp 98.7°F | Resp 18 | Ht 64.5 in | Wt 178.9 lb

## 2014-12-17 DIAGNOSIS — Z95828 Presence of other vascular implants and grafts: Secondary | ICD-10-CM

## 2014-12-17 DIAGNOSIS — D509 Iron deficiency anemia, unspecified: Secondary | ICD-10-CM | POA: Diagnosis not present

## 2014-12-17 DIAGNOSIS — K3189 Other diseases of stomach and duodenum: Secondary | ICD-10-CM

## 2014-12-17 DIAGNOSIS — J449 Chronic obstructive pulmonary disease, unspecified: Secondary | ICD-10-CM

## 2014-12-17 DIAGNOSIS — Z853 Personal history of malignant neoplasm of breast: Secondary | ICD-10-CM | POA: Diagnosis present

## 2014-12-17 DIAGNOSIS — D518 Other vitamin B12 deficiency anemias: Secondary | ICD-10-CM | POA: Diagnosis not present

## 2014-12-17 DIAGNOSIS — D631 Anemia in chronic kidney disease: Secondary | ICD-10-CM

## 2014-12-17 DIAGNOSIS — Z45010 Encounter for checking and testing of cardiac pacemaker pulse generator [battery]: Secondary | ICD-10-CM

## 2014-12-17 DIAGNOSIS — J9610 Chronic respiratory failure, unspecified whether with hypoxia or hypercapnia: Secondary | ICD-10-CM

## 2014-12-17 DIAGNOSIS — D638 Anemia in other chronic diseases classified elsewhere: Secondary | ICD-10-CM

## 2014-12-17 DIAGNOSIS — N189 Chronic kidney disease, unspecified: Secondary | ICD-10-CM

## 2014-12-17 DIAGNOSIS — Z515 Encounter for palliative care: Secondary | ICD-10-CM

## 2014-12-17 LAB — HOLD TUBE, BLOOD BANK

## 2014-12-17 LAB — CBC & DIFF AND RETIC
BASO%: 0.5 % (ref 0.0–2.0)
BASOS ABS: 0 10*3/uL (ref 0.0–0.1)
EOS%: 0.6 % (ref 0.0–7.0)
Eosinophils Absolute: 0 10*3/uL (ref 0.0–0.5)
HCT: 25.6 % — ABNORMAL LOW (ref 34.8–46.6)
HGB: 8.7 g/dL — ABNORMAL LOW (ref 11.6–15.9)
IMMATURE RETIC FRACT: 9.6 % (ref 1.60–10.00)
LYMPH%: 9.7 % — ABNORMAL LOW (ref 14.0–49.7)
MCH: 31.4 pg (ref 25.1–34.0)
MCHC: 34 g/dL (ref 31.5–36.0)
MCV: 92.3 fL (ref 79.5–101.0)
MONO#: 0.5 10*3/uL (ref 0.1–0.9)
MONO%: 6.1 % (ref 0.0–14.0)
NEUT#: 6.9 10*3/uL — ABNORMAL HIGH (ref 1.5–6.5)
NEUT%: 83.1 % — ABNORMAL HIGH (ref 38.4–76.8)
PLATELETS: 219 10*3/uL (ref 145–400)
RBC: 2.78 10*6/uL — ABNORMAL LOW (ref 3.70–5.45)
RDW: 16.2 % — ABNORMAL HIGH (ref 11.2–14.5)
Retic %: 2.3 % — ABNORMAL HIGH (ref 0.70–2.10)
Retic Ct Abs: 63.94 10*3/uL (ref 33.70–90.70)
WBC: 8.4 10*3/uL (ref 3.9–10.3)
lymph#: 0.8 10*3/uL — ABNORMAL LOW (ref 0.9–3.3)
nRBC: 0 % (ref 0–0)

## 2014-12-17 LAB — TECHNOLOGIST REVIEW

## 2014-12-17 MED ORDER — SODIUM CHLORIDE 0.9 % IJ SOLN
10.0000 mL | INTRAMUSCULAR | Status: DC | PRN
  Administered 2014-12-17: 10 mL via INTRAVENOUS
  Filled 2014-12-17: qty 10

## 2014-12-17 MED ORDER — DARBEPOETIN ALFA 300 MCG/0.6ML IJ SOSY
300.0000 ug | PREFILLED_SYRINGE | Freq: Once | INTRAMUSCULAR | Status: AC
  Administered 2014-12-17: 300 ug via SUBCUTANEOUS
  Filled 2014-12-17: qty 0.6

## 2014-12-17 MED ORDER — HEPARIN SOD (PORK) LOCK FLUSH 100 UNIT/ML IV SOLN
500.0000 [IU] | Freq: Once | INTRAVENOUS | Status: AC
  Administered 2014-12-17: 500 [IU] via INTRAVENOUS
  Filled 2014-12-17: qty 5

## 2014-12-17 NOTE — Progress Notes (Signed)
12-17-14 1530 Spoke with pt per phone for phone call assessment and pre procedure instructions. Pt. States received a call a few days ago from Dr. Tanna Furry office, was told 'her tank was empty"-referencing pacemaker battery was low as indicated from remote phone device check and is scheduled to see Dr. Lovena Le 12-23-14 to discuss. Pt. Is wanting to know if she is able to have all procedure still as already planned and coordinated with [facility] -12-24-14(labs and blood transfusion), and Endo procedure with Dr. Ardis Hughs 12-25-14. All were coordinated to allow family to be present. Family has a planned out of town trip. I informed pt. I would make anesthesia team aware of this issue, which I spoke with Dr. Marcell Barlow. He is okay with proceeding as planned without further Pacemaker intervention -if 12-23-14 visit with Dr. Lovena Le is done and Dr. Lillia Corporal patient may proceed with Endo procedure without further pacemaker intervention at this time. "[name]" of Dr. Ardis Hughs office aware and "Sharee Pimple" Endo([facility]) is aware. I also talked back to pt to make her aware of all concerns for this depending on Dr. Tanna Furry office visit and his okay. Hutton Pellicane,RN

## 2014-12-17 NOTE — Telephone Encounter (Signed)
Gave adn printed appt sched and avs for pt for July and AUG °

## 2014-12-17 NOTE — Progress Notes (Signed)
Injection given by desk nurse 

## 2014-12-17 NOTE — Patient Instructions (Signed)
Darbepoetin Alfa injection What is this medicine? DARBEPOETIN ALFA (dar be POE e tin AL fa) helps your body make more red blood cells. It is used to treat anemia caused by chronic kidney failure and chemotherapy. This medicine may be used for other purposes; ask your health care provider or pharmacist if you have questions. COMMON BRAND NAME(S): Aranesp What should I tell my health care provider before I take this medicine? They need to know if you have any of these conditions: -blood clotting disorders or history of blood clots -cancer patient not on chemotherapy -cystic fibrosis -heart disease, such as angina, heart failure, or a history of a heart attack -hemoglobin level of 12 g/dL or greater -high blood pressure -low levels of folate, iron, or vitamin B12 -seizures -an unusual or allergic reaction to darbepoetin, erythropoietin, albumin, hamster proteins, latex, other medicines, foods, dyes, or preservatives -pregnant or trying to get pregnant -breast-feeding How should I use this medicine? This medicine is for injection into a vein or under the skin. It is usually given by a health care professional in a hospital or clinic setting. If you get this medicine at home, you will be taught how to prepare and give this medicine. Do not shake the solution before you withdraw a dose. Use exactly as directed. Take your medicine at regular intervals. Do not take your medicine more often than directed. It is important that you put your used needles and syringes in a special sharps container. Do not put them in a trash can. If you do not have a sharps container, call your pharmacist or healthcare provider to get one. Talk to your pediatrician regarding the use of this medicine in children. While this medicine may be used in children as young as 1 year for selected conditions, precautions do apply. Overdosage: If you think you have taken too much of this medicine contact a poison control center or  emergency room at once. NOTE: This medicine is only for you. Do not share this medicine with others. What if I miss a dose? If you miss a dose, take it as soon as you can. If it is almost time for your next dose, take only that dose. Do not take double or extra doses. What may interact with this medicine? Do not take this medicine with any of the following medications: -epoetin alfa This list may not describe all possible interactions. Give your health care provider a list of all the medicines, herbs, non-prescription drugs, or dietary supplements you use. Also tell them if you smoke, drink alcohol, or use illegal drugs. Some items may interact with your medicine. What should I watch for while using this medicine? Visit your prescriber or health care professional for regular checks on your progress and for the needed blood tests and blood pressure measurements. It is especially important for the doctor to make sure your hemoglobin level is in the desired range, to limit the risk of potential side effects and to give you the best benefit. Keep all appointments for any recommended tests. Check your blood pressure as directed. Ask your doctor what your blood pressure should be and when you should contact him or her. As your body makes more red blood cells, you may need to take iron, folic acid, or vitamin B supplements. Ask your doctor or health care provider which products are right for you. If you have kidney disease continue dietary restrictions, even though this medication can make you feel better. Talk with your doctor or health   care professional about the foods you eat and the vitamins that you take. What side effects may I notice from receiving this medicine? Side effects that you should report to your doctor or health care professional as soon as possible: -allergic reactions like skin rash, itching or hives, swelling of the face, lips, or tongue -breathing problems -changes in vision -chest  pain -confusion, trouble speaking or understanding -feeling faint or lightheaded, falls -high blood pressure -muscle aches or pains -pain, swelling, warmth in the leg -rapid weight gain -severe headaches -sudden numbness or weakness of the face, arm or leg -trouble walking, dizziness, loss of balance or coordination -seizures (convulsions) -swelling of the ankles, feet, hands -unusually weak or tired Side effects that usually do not require medical attention (report to your doctor or health care professional if they continue or are bothersome): -diarrhea -fever, chills (flu-like symptoms) -headaches -nausea, vomiting -redness, stinging, or swelling at site where injected This list may not describe all possible side effects. Call your doctor for medical advice about side effects. You may report side effects to FDA at 1-800-FDA-1088. Where should I keep my medicine? Keep out of the reach of children. Store in a refrigerator between 2 and 8 degrees C (36 and 46 degrees F). Do not freeze. Do not shake. Throw away any unused portion if using a single-dose vial. Throw away any unused medicine after the expiration date. NOTE: This sheet is a summary. It may not cover all possible information. If you have questions about this medicine, talk to your doctor, pharmacist, or health care provider.  2015, Elsevier/Gold Standard. (2008-05-20 10:23:57)  

## 2014-12-17 NOTE — Patient Instructions (Signed)

## 2014-12-18 ENCOUNTER — Telehealth: Payer: Self-pay

## 2014-12-18 ENCOUNTER — Telehealth: Payer: Self-pay | Admitting: Internal Medicine

## 2014-12-18 DIAGNOSIS — Z45010 Encounter for checking and testing of cardiac pacemaker pulse generator [battery]: Secondary | ICD-10-CM | POA: Insufficient documentation

## 2014-12-18 NOTE — Telephone Encounter (Signed)
Pt has EGD on 12/25/14, per the pt her pacemaker battery is low.  She is seeing the cardiologist Dr Lovena Le on 12/23/14 and the cancer center on 12/24/14.  The anesthesiologist will only do her procedure if she gets clearance from Dr Lovena Le stating it is ok to have procedure with low battery.  I have sent a letter to Dr Lovena Le for clearance.   Please review.  I have also called and verified that Dr Lovena Le has received the request.

## 2014-12-18 NOTE — Assessment & Plan Note (Signed)
Her current pacemake is at the end of battery life. She has planned battery replacement in the near future We discussed whether she should proceed with battery replacement of not given her significant co-morbidities

## 2014-12-18 NOTE — Assessment & Plan Note (Addendum)
The absence of significant reticulocytosis in the presence of recent GI bleed suggested component of anemia of chronic disease. Recent serum vitamin B-12 and iron studies were adequate. Serum erythropoietin level is low I discussed with her the risks and benefits of Aranesp injection and spoke with her daughter over the phone in the presence of the patient. .We discussed some of the risks, benefits, and alternatives of erythropoietin stimulating agents such as Procrit or Aranesp. The patient is symptomatic from anemia and the EPO level is low. Some of the side-effects to be expected including risks of allergic reactions, skin rashes, headaches, risk of blood clots including heart attack and stroke. There is rare risks of causing growth of cancers.The patient is willing to proceed and went ahead to sign consent today. I will start with inj every 2 weeks X 4 and reassess Goal is to keep hemoglobin > 10

## 2014-12-18 NOTE — Telephone Encounter (Signed)
New Message  Rn from Dr. Ardis Hughs office following up on surgical clearance letter- for upcoming endoscopy. Please call back and discuss.

## 2014-12-18 NOTE — Assessment & Plan Note (Signed)
We discussed advance directives and living will She has made contact with palliative care service to continue palliative care at home

## 2014-12-18 NOTE — Assessment & Plan Note (Signed)
She has severe COPD and recurrent pneumonia. She is on steroids, inhalers and oxygen therapy

## 2014-12-18 NOTE — Assessment & Plan Note (Signed)
We have a long discussion about this She is unsure whether to pursue repeat EGD and biopsy next week. She is not sure if she wants to pursue radiation treatment We discussed possibilities of what would happen if this is malignant. Ultimately I recommend she discuss this further with family members

## 2014-12-18 NOTE — Telephone Encounter (Signed)
FYI Dr Ardis Hughs see previous note

## 2014-12-18 NOTE — Telephone Encounter (Signed)
lmom for daughter to return my call.  lmom that Dr Lovena Le said okay to proceed with endoscopy

## 2014-12-18 NOTE — Assessment & Plan Note (Signed)
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 Recent endoscopy revealed a duodenal mass. Unfortunately, biopsy was non diagnostic although malignancy is suspected. [facility] the [facility] was discussed with the patient and at present time she is leaning towards not undergoing aggressive surgical management. I will come for with my radiation oncology colleagues to see if palliative radiation might be an option.. Recent iron studies were unremarkable for severe iron deficiency. I will reserve to only give her IV iron if ferritin is less than 100 She will continue blood count monitoring weekly and plan would be to give her 1 unit of blood if hemoglobin < 8

## 2014-12-18 NOTE — Telephone Encounter (Signed)
See alternate note Dr Lovena Le has ok'd the pt to have the EGD.

## 2014-12-18 NOTE — Progress Notes (Signed)
[facility] OFFICE PROGRESS NOTE  Patient Care Team: Prince Solian, MD as PCP - General (Internal Medicine) Gatha Mayer, MD as Consulting Physician (Gastroenterology) Evans Lance, MD as Consulting Physician (Cardiology) Collene Gobble, MD as Consulting Physician (Pulmonary Disease) Jiles Prows, MD as Attending Physician ([place]) Heath Lark, MD as Consulting Physician (Hematology and Oncology) Prince Solian, MD  SUMMARY OF ONCOLOGIC HISTORY:  This patient have remote history of breast cancer diagnosed from screening mammogram in 2003. She had surgery, radiation therapy and adjuvant tamoxifen. 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 She had received B12 injection for B12 deficiency and intravenous iron for iron deficiency anemia. The patient had history of allergic reaction to intravenous iron dextran. She received intravenous iron feraheme in November 1683 without complications. In May 2016, she was found to have melanoma and significant GI bleed. On 11/24/2014, EGD and small bowel endoscopy revealed gastritis, AVM and duodenal mass. Biopsy showed nonmalignant tubular adenoma.Accession: [id number] CT scan of the abdomen dated 11/13/2014 show no retroperitoneal or other acute hemorrhage. Descending colostomy with peristomal hernia containing nonobstructed bowel. Possible cirrhosis She have recurrent admission to the hospital for severe anemia.  INTERVAL HISTORY: Please see below for problem oriented charting. She returns for further follow-up. Her case was recently presented at GI tumor board with plan for repeat endoscopy, biopsy and placement of clip. If biopsy is malignant, radiation therapy can be offered. If not, possibility of embolization of blood vessel would be an option. She is not sure if she wants to proceed. She was informed her pacemaker battery needs to be replaced. The  patient denies any recent signs or symptoms of bleeding such as spontaneous epistaxis, hematuria or hematochezia. She feels weak, dyspneic with recurrent non-productive cough and wheezing REVIEW OF SYSTEMS:   Constitutional: Denies fevers, chills or abnormal weight loss Eyes: Denies blurriness of vision Ears, nose, mouth, throat, and face: Denies mucositis or sore throat Cardiovascular: Denies palpitation, chest discomfort or lower extremity swelling Gastrointestinal:  Denies nausea, heartburn or change in bowel habits Skin: Denies abnormal skin rashes Lymphatics: Denies new lymphadenopathy or easy bruising Neurological:Denies numbness, tingling or new weaknesses Behavioral/Psych: Mood is stable, no new changes  All other systems were reviewed with the patient and are negative.  I have reviewed the past medical history, past surgical history, social history and family history with the patient and they are unchanged from previous note.  ALLERGIES:  is allergic to aspirin; sulfonamide derivatives; aspirin; ibuprofen; iron dextran; sulfa antibiotics; sulfa antibiotics; tape; and metoclopramide hcl.  MEDICATIONS:  Current Outpatient Prescriptions  Medication Sig Dispense Refill  . acetaminophen (TYLENOL) 325 MG tablet Take 650 mg by mouth every 6 (six) hours as needed for mild pain.     [name] albuterol (PROVENTIL HFA;VENTOLIN HFA) 108 (90 BASE) MCG/ACT inhaler Inhale 2 puffs into the lungs 2 (two) times daily.    [name] albuterol (PROVENTIL) (2.5 MG/3ML) 0.083% nebulizer solution Take 2.5 mg by nebulization every 4 (four) hours as needed for wheezing or shortness of breath.    . ALPRAZolam (XANAX) 0.25 MG tablet Take 0.25 mg by mouth at bedtime.     . baclofen (LIORESAL) 10 MG tablet Take 10 mg by mouth every 8 (eight) hours as needed for muscle spasms.     . beclomethasone (QVAR) 80 MCG/ACT inhaler Inhale 3 puffs into the lungs 2 (two) times daily.     . budesonide (RHINOCORT AQUA) 32  MCG/ACT nasal  spray Place 2 sprays into both nostrils daily.    . Calcium Carbonate-Vitamin D 600-400 MG-UNIT per tablet Take 1 tablet by mouth daily.    . Carboxymethylcellulose Sodium (LUBRICANT EYE DROPS OP) Apply 1 drop to eye daily.    [name] DEXILANT 60 MG capsule Take 60 mg by mouth daily before breakfast.  3  . diphenhydrAMINE (BENADRYL) 25 mg capsule Take 25 mg by mouth daily as needed for itching.     . DULoxetine (CYMBALTA) 30 MG capsule Take 30 mg by mouth at bedtime.  3  . EPINEPHrine (EPIPEN IJ) Inject 1 application as directed as needed.     . Eszopiclone 3 MG TABS Take 3 mg by mouth daily as needed (sleep).   2  . furosemide (LASIX) 40 MG tablet Take 40 mg by mouth daily.    [name] gabapentin (NEURONTIN) 100 MG capsule Take 100 mg by mouth daily.    [name] guaiFENesin-dextromethorphan (ROBITUSSIN DM) 100-10 MG/5ML syrup Take 15 mLs by mouth every 4 (four) hours as needed for cough.    . hydrocortisone cream 1 % Apply 1 application topically daily as needed for itching.    [name] ketoconazole (NIZORAL) 2 % cream Apply topically every morning. Apply to affected area (rash).  6  . levothyroxine (SYNTHROID, LEVOTHROID) 50 MCG tablet Take 50 mcg by mouth daily.  3  . losartan (COZAAR) 50 MG tablet Take 50 mg by mouth daily.    . metoprolol succinate (TOPROL-XL) 50 MG 24 hr tablet Take 50 mg by mouth daily.  1  . mirtazapine (REMERON) 15 MG tablet Take 15 mg by mouth at bedtime.   3  . Multiple Vitamins-Minerals (MULTIVITAMIN & MINERAL PO) Take 1 tablet by mouth daily.    . mupirocin cream (BACTROBAN) 2 % Apply 1 application topically 2 (two) times daily.    [name] oxycodone (OXY-IR) 5 MG capsule Take 5 mg by mouth every 4 (four) hours as needed for pain.    . polyethylene glycol (MIRALAX / GLYCOLAX) packet Take 17 g by mouth daily as needed for mild constipation or moderate constipation.    . Polyvinyl Alcohol (LIQUID TEARS OP) Apply 2 drops to eye 2 (two) times daily.     . pravastatin (PRAVACHOL) 40 MG tablet Take 40 mg  by mouth at bedtime.   2  . predniSONE (DELTASONE) 5 MG tablet Take 5 mg by mouth daily with breakfast.     . PROAIR HFA 108 (90 BASE) MCG/ACT inhaler Inhale 2 puffs into the lungs 2 (two) times daily.     [name] QVAR 80 MCG/ACT inhaler Inhale 3 puffs into the lungs 2 (two) times daily.  2  . ranitidine (ZANTAC) 300 MG tablet Take 300 mg by mouth at bedtime.  3  . senna-docusate (SENOKOT-S) 8.6-50 MG per tablet Take 1 tablet by mouth at bedtime.     . traZODone (DESYREL) 50 MG tablet Take 50 mg by mouth as needed for sleep.     . Vitamin D, Ergocalciferol, (DRISDOL) 50000 UNITS CAPS capsule Take 50,000 Units by mouth once a week. Every Sunday  3  . XOLAIR 150 MG injection Inject 150 mg into the skin every 28 (twenty-eight) days.     . zafirlukast (ACCOLATE) 20 MG tablet Take 20 mg by mouth 2 (two) times daily.  3   Current Facility-Administered Medications  Medication Dose Route Frequency Provider Last Rate Last Dose  . sodium chloride 0.9 % injection 10 mL  10 mL Intravenous PRN  Heath Lark, MD   10 mL at 12/17/14 1110    PHYSICAL EXAMINATION: ECOG PERFORMANCE STATUS: 2 - Symptomatic, <50% confined to bed  Filed Vitals:   12/17/14 1010  BP: 147/56  Pulse: 65  Temp: 98.7 F (37.1 C)  Resp: 18   Filed Weights   12/17/14 1010  Weight: 178 lb 14.4 oz (81.149 kg)    GENERAL:alert, no distress and comfortable. She looks debilitated SKIN: skin color, texture, turgor are normal, no rashes or significant lesions with significant bruising EYES: normal, Conjunctiva are pink and non-injected, sclera clear OROPHARYNX:no exudate, no erythema and lips, buccal mucosa, and tongue normal  NECK: supple, thyroid normal size, non-tender, without nodularity LYMPH:  no palpable lymphadenopathy in the cervical, axillary or inguinal LUNGS:diffuse wheezes without crackles. She is using oxygen HEART: regular rate & rhythm and no murmurs and no lower extremity edema ABDOMEN:abdomen soft, non-tender and normal  bowel sounds Musculoskeletal:no cyanosis of digits and no clubbing  NEURO: alert & oriented x 3 with fluent speech, no focal motor/sensory deficits  LABORATORY DATA:  I have reviewed the data as listed    Component Value Date/Time   NA 142 12/10/2014 1004   NA 136 11/26/2014 0550   K 4.0 12/10/2014 1004   K 3.7 11/26/2014 0550   CL 102 11/26/2014 0550   CO2 30* 12/10/2014 1004   CO2 26 11/26/2014 0550   GLUCOSE 99 12/10/2014 1004   GLUCOSE 96 11/26/2014 0550   BUN 33.0* 12/10/2014 1004   BUN 21* 11/26/2014 0550   CREATININE 1.0 12/10/2014 1004   CREATININE 1.12* 11/26/2014 0550   CALCIUM 9.9 12/10/2014 1004   CALCIUM 9.1 11/26/2014 0550   PROT 5.5* 12/10/2014 1004   PROT 4.8* 11/22/2014 0513   ALBUMIN 3.7 12/10/2014 1004   ALBUMIN 3.0* 11/22/2014 0513   AST 26 12/10/2014 1004   AST 21 11/22/2014 0513   ALT 32 12/10/2014 1004   ALT 19 11/22/2014 0513   ALKPHOS 51 12/10/2014 1004   ALKPHOS 36* 11/22/2014 0513   BILITOT 0.78 12/10/2014 1004   BILITOT 1.6* 11/22/2014 0513   GFRNONAA 46* 11/26/2014 0550   GFRAA 53* 11/26/2014 0550    No results found for: SPEP, UPEP  Lab Results  Component Value Date   WBC 8.4 12/17/2014   NEUTROABS 6.9* 12/17/2014   HGB 8.7* 12/17/2014   HCT 25.6* 12/17/2014   MCV 92.3 12/17/2014   PLT 219 12/17/2014      Chemistry      Component Value Date/Time   NA 142 12/10/2014 1004   NA 136 11/26/2014 0550   K 4.0 12/10/2014 1004   K 3.7 11/26/2014 0550   CL 102 11/26/2014 0550   CO2 30* 12/10/2014 1004   CO2 26 11/26/2014 0550   BUN 33.0* 12/10/2014 1004   BUN 21* 11/26/2014 0550   CREATININE 1.0 12/10/2014 1004   CREATININE 1.12* 11/26/2014 0550      Component Value Date/Time   CALCIUM 9.9 12/10/2014 1004   CALCIUM 9.1 11/26/2014 0550   ALKPHOS 51 12/10/2014 1004   ALKPHOS 36* 11/22/2014 0513   AST 26 12/10/2014 1004   AST 21 11/22/2014 0513   ALT 32 12/10/2014 1004   ALT 19 11/22/2014 0513   BILITOT 0.78 12/10/2014 1004    BILITOT 1.6* 11/22/2014 0513      ASSESSMENT & PLAN:  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 She is not sure if  she wants to pursue radiation treatment We discussed possibilities of what would happen if this is malignant. Ultimately I recommend she discuss this further with family members  Anemia in chronic illness The absence of significant reticulocytosis in the presence of recent GI bleed suggested component of anemia of chronic disease. Recent serum vitamin B-12 and iron studies were adequate. Serum erythropoietin level is low I discussed with her the risks and benefits of Aranesp injection and spoke with her daughter over the phone in the presence of the patient. .We discussed some of the risks, benefits, and alternatives of erythropoietin stimulating agents such as Procrit or Aranesp. The patient is symptomatic from anemia and the EPO level is low. Some of the side-effects to be expected including risks of allergic reactions, skin rashes, headaches, risk of blood clots including heart attack and stroke. There is rare risks of causing growth of cancers.The patient is willing to proceed and went ahead to sign consent today. I will start with inj every 2 weeks X 4 and reassess Goal is to keep hemoglobin > 10  Iron deficiency anemia She has confirmed melana and AVM in the past. Recent endoscopy revealed a duodenal mass. Unfortunately, biopsy was non diagnostic although malignancy is suspected. [facility] the [facility] was discussed with the patient and at present time she is leaning towards not undergoing aggressive surgical management. I will come for with my radiation oncology colleagues to see if palliative radiation might be an option.. Recent iron studies were unremarkable for severe iron deficiency. I will reserve to only give her IV iron if ferritin is less than 100 She will  continue blood count monitoring weekly and plan would be to give her 1 unit of blood if hemoglobin < 8  Pacemaker at end of battery life Her current pacemake is at the end of battery life. She has planned battery replacement in the near future We discussed whether she should proceed with battery replacement of not given her significant co-morbidities  Palliative care encounter We discussed advance directives and living will She has made contact with palliative care service to continue palliative care at home  Chronic respiratory failure She has severe COPD and recurrent pneumonia. She is on steroids, inhalers and oxygen therapy   No orders of the defined types were placed in this encounter.   All questions were answered. The patient knows to call the clinic with any problems, questions or concerns. No barriers to learning was detected. I spent 40 minutes counseling the patient face to face. The total time spent in the appointment was 55 minutes and more than 50% was on counseling and review of test results     [facility], [place], MD 12/18/2014 9:11 PM

## 2014-12-18 NOTE — Telephone Encounter (Signed)
New Prob    Daughter has some questions regarding upcoming procedure. Please call.

## 2014-12-18 NOTE — Telephone Encounter (Addendum)
Discussed with Dr Vergia Alberts to proceed with endoscopy

## 2014-12-19 ENCOUNTER — Ambulatory Visit (HOSPITAL_COMMUNITY)
Admission: RE | Admit: 2014-12-19 | Discharge: 2014-12-19 | Disposition: A | Discharge status: Home / Self Care | Payer: Medicare Other | Source: Ambulatory Visit | Attending: Hematology and Oncology | Admitting: Hematology and Oncology

## 2014-12-19 DIAGNOSIS — D509 Iron deficiency anemia, unspecified: Secondary | ICD-10-CM | POA: Insufficient documentation

## 2014-12-19 DIAGNOSIS — D638 Anemia in other chronic diseases classified elsewhere: Secondary | ICD-10-CM

## 2014-12-19 NOTE — Telephone Encounter (Signed)
Great, thanks for getting that information!

## 2014-12-23 ENCOUNTER — Ambulatory Visit (INDEPENDENT_AMBULATORY_CARE_PROVIDER_SITE_OTHER): Payer: Medicare Other | Admitting: Internal Medicine

## 2014-12-23 ENCOUNTER — Other Ambulatory Visit: Payer: Self-pay

## 2014-12-23 ENCOUNTER — Encounter: Payer: Self-pay | Admitting: Internal Medicine

## 2014-12-23 VITALS — BP 146/58 | HR 68 | Ht 64.5 in | Wt 177.8 lb

## 2014-12-23 DIAGNOSIS — Z950 Presence of cardiac pacemaker: Secondary | ICD-10-CM

## 2014-12-23 DIAGNOSIS — Z7901 Long term (current) use of anticoagulants: Secondary | ICD-10-CM

## 2014-12-23 DIAGNOSIS — I10 Essential (primary) hypertension: Secondary | ICD-10-CM | POA: Diagnosis not present

## 2014-12-23 LAB — CUP PACEART INCLINIC DEVICE CHECK
Battery Remaining Longevity: 6 mo
Battery Voltage: 2.63 V
Brady Statistic RV Percent Paced: 100 %
Date Time Interrogation Session: 20160705155126
Lead Channel Impedance Value: 67 Ohm
Lead Channel Setting Sensing Sensitivity: 2.8 mV
MDC IDC MSMT BATTERY IMPEDANCE: 5971 Ohm
MDC IDC MSMT LEADCHNL RV IMPEDANCE VALUE: 583 Ohm
MDC IDC SET LEADCHNL RV PACING AMPLITUDE: 2.5 V
MDC IDC SET LEADCHNL RV PACING PULSEWIDTH: 0.4 ms

## 2014-12-23 NOTE — Assessment & Plan Note (Signed)
Her blood pressure is reasonably well controlled. Will follow.

## 2014-12-23 NOTE — Assessment & Plan Note (Signed)
She is asymptomatic. No change in medications.

## 2014-12-23 NOTE — Progress Notes (Signed)
HPI [name] returns today for followup. She is a pleasant 81 yo woman with multiple medical problems including complete heart block, s/p PPM insertion, who now is pending EGD for possible duodenal malignancy. She has a h/o perforated viscous/peritonitis/s/p colostomy. She has developed worsening anemia with black stools in her colostomy bag. She has had multiple blood transfusions and is now undergoing a trial of EPO. The patient has reached ERI on her PPM and has reverted to VVI pacing at 65/min.   Allergies  Allergen Reactions  . Aspirin Shortness Of Breath    Exacerbates asthma  . Sulfonamide Derivatives Hives  . Ibuprofen Other (See Comments)    Doesn't take because of her aspirin allergy  . Iron Dextran Itching     itching after test dose  . Sulfa Antibiotics Hives  . Tape Other (See Comments)    Prefers "paper" tape (very thin skin-clear tape rips skin).  . Metoclopramide Hcl Other (See Comments)    REACTION: chomping mouth     Current Outpatient Prescriptions  Medication Sig Dispense Refill  . acetaminophen (TYLENOL) 325 MG tablet Take 650 mg by mouth every 6 (six) hours as needed for mild pain.     [name] albuterol (PROVENTIL HFA;VENTOLIN HFA) 108 (90 BASE) MCG/ACT inhaler Inhale 2 puffs into the lungs 2 (two) times daily.    [name] albuterol (PROVENTIL) (2.5 MG/3ML) 0.083% nebulizer solution Take 2.5 mg by nebulization every 4 (four) hours as needed for wheezing or shortness of breath.    . ALPRAZolam (XANAX) 0.25 MG tablet Take 0.25 mg by mouth at bedtime.     . baclofen (LIORESAL) 10 MG tablet Take 10 mg by mouth every 8 (eight) hours as needed for muscle spasms.     . beclomethasone (QVAR) 80 MCG/ACT inhaler Inhale 3 puffs into the lungs 2 (two) times daily.     . budesonide (RHINOCORT AQUA) 32 MCG/ACT nasal spray Place 1 spray into both nostrils 2 (two) times daily.     . Calcium Carbonate-Vitamin D 600-400 MG-UNIT per tablet Take 1 tablet by mouth daily.    .  Carboxymethylcellulose Sodium (LUBRICANT EYE DROPS OP) Apply 1 drop to eye daily.    [name] DEXILANT 60 MG capsule Take 60 mg by mouth daily before breakfast.  3  . diphenhydrAMINE (BENADRYL) 25 mg capsule Take 25 mg by mouth daily as needed for itching.     . DULoxetine (CYMBALTA) 30 MG capsule Take 30 mg by mouth at bedtime.  3  . EPINEPHrine (EPIPEN IJ) Inject 1 application as directed as needed.     . Eszopiclone 3 MG TABS Take 3 mg by mouth daily as needed (sleep).   2  . furosemide (LASIX) 40 MG tablet Take 40 mg by mouth daily.    [name] gabapentin (NEURONTIN) 100 MG capsule Take 100 mg by mouth daily.    [name] guaiFENesin-dextromethorphan (ROBITUSSIN DM) 100-10 MG/5ML syrup Take 15 mLs by mouth every 4 (four) hours as needed for cough.    . hydrocortisone cream 1 % Apply 1 application topically daily as needed for itching.    [name] ketoconazole (NIZORAL) 2 % cream Apply topically every morning. Apply to affected area (rash).  6  . levothyroxine (SYNTHROID, LEVOTHROID) 50 MCG tablet Take 50 mcg by mouth daily.  3  . losartan (COZAAR) 50 MG tablet Take 50 mg by mouth daily.    . metoprolol succinate (TOPROL-XL) 50 MG 24 hr tablet Take 50 mg by mouth daily.  1  .  mirtazapine (REMERON) 15 MG tablet Take 15 mg by mouth at bedtime.   3  . Multiple Vitamins-Minerals (MULTIVITAMIN & MINERAL PO) Take 1 tablet by mouth daily.    . mupirocin cream (BACTROBAN) 2 % Apply 1 application topically 2 (two) times daily.    [name] oxycodone (OXY-IR) 5 MG capsule Take 5 mg by mouth every 4 (four) hours as needed for pain.    . polyethylene glycol (MIRALAX / GLYCOLAX) packet Take 17 g by mouth daily as needed for mild constipation or moderate constipation.    . Polyvinyl Alcohol (LIQUID TEARS OP) Apply 2 drops to eye 2 (two) times daily.     . pravastatin (PRAVACHOL) 40 MG tablet Take 40 mg by mouth at bedtime.   2  . predniSONE (DELTASONE) 5 MG tablet Take 5 mg by mouth daily with breakfast.     . QVAR 80 MCG/ACT inhaler Inhale  3 puffs into the lungs 2 (two) times daily.  2  . ranitidine (ZANTAC) 300 MG tablet Take 300 mg by mouth at bedtime.  3  . senna-docusate (SENOKOT-S) 8.6-50 MG per tablet Take 1 tablet by mouth at bedtime.     . traZODone (DESYREL) 50 MG tablet Take 50 mg by mouth at bedtime as needed for sleep.     . Vitamin D, Ergocalciferol, (DRISDOL) 50000 UNITS CAPS capsule Take 50,000 Units by mouth once a week. Every Sunday  3  . XOLAIR 150 MG injection Inject 150 mg into the skin every 28 (twenty-eight) days.     . zafirlukast (ACCOLATE) 20 MG tablet Take 20 mg by mouth 2 (two) times daily.  3   No current facility-administered medications for this visit.     Past Medical History  Diagnosis Date  . Asthma   . Presence of permanent cardiac pacemaker   . Hypertension   . Hypothyroidism   . Anxiety   . Cancer     breast; finger  . PONV (postoperative nausea and vomiting)   . Insomnia, unspecified   . Osteoarthrosis, unspecified whether generalized or localized, unspecified site   . Unspecified hypothyroidism   . Unspecified essential hypertension   . Atrial fibrillation   . Sciatica   . Anemia   . COPD (chronic obstructive pulmonary disease)   . UTI (urinary tract infection)   . GERD (gastroesophageal reflux disease)   . Pneumonia   . Asthma   . Clostridium difficile colitis   . Diverticulitis of sigmoid colon 11/2010    with abscess/contained perforation  . Dementia     mild  . Dehydration with hyponatremia 12/2010    hospitalized  . Angiodysplasia of stomach     ablated 2010  . Angiodysplasia of colon     ablated 2010  . Vitamin B12 deficiency (dietary) anemia 04/12/2011  . Shingles   . Sarcoma     right hand  . Malignant neoplasm of breast (female), unspecified site     left  . Dupuytren's disease     left hand  . Vitamin B12 deficiency 04/02/2012  . Complication of anesthesia     " very cautious because of my COPD"  . Pacemaker   . Gastric mass 12/02/2014  . Renal  insufficiency 11/21/2014  . History of oxygen administration     12-17-14 using oxygen 2 l/m nasally bedtime always and if needed with exertion  . Transfusion history     12-17-14- last transfusion 12-03-14     ROS:   All systems reviewed and negative except  as noted in the HPI.   Past Surgical History  Procedure Laterality Date  . Breast surgery      left breast mammosite surgery  . Insert / replace / remove pacemaker    . Enteroscopy N/A 11/24/2014    Procedure: ENTEROSCOPY;  Surgeon: Ladene Artist, MD;  Location: [facility] ENDOSCOPY;  Service: Endoscopy;  Laterality: N/A;  . Colectomy with colostomy creation/hartmann procedure  2013  . Abdominal wound dehiscence      stratus mesh placed with g-tube  . Nasal sinus surgery      x 3  . Appendectomy    . Hernia repair    . Abdominal hysterectomy    . Pacemaker insertion    . Eye surgery      x6-bilateral  . Finger amputation      right  . Foot surgery      right toes x 3  . Tonsillectomy    . Colonoscopy  01/2009    w/ablation of 3 mm AVM, diverticulosis, external hemorrhoids  . Upper gastrointestinal endoscopy  01/2009    w/polypectomy, AVM, hyperplastic polyp  . Abdominal hysterectomy    . Laparotomy  11/12/2011    Procedure: EXPLORATORY LAPAROTOMY;  Surgeon: Madilyn Hook, DO;  Location: [facility] ORS;  Service: General;  Laterality: N/A;  . Colostomy revision  11/12/2011    Procedure: COLON RESECTION SIGMOID;  Surgeon: Madilyn Hook, DO;  Location: [facility] ORS;  Service: General;  Laterality: N/A;  . Colostomy  11/12/2011    Procedure: COLOSTOMY;  Surgeon: Madilyn Hook, DO;  Location: [facility];  Service: General;  Laterality: Left;  End colostomy  . Laparotomy  11/19/2011    Procedure: EXPLORATORY LAPAROTOMY;  Surgeon: Shann Medal, MD;  Location: [facility];  Service: General;  Laterality: N/A;  . Gastrostomy  11/19/2011    Procedure: GASTROSTOMY;  Surgeon: Shann Medal, MD;  Location: [facility];  Service: General;;  INSERTION GASTROSTOMY TUBE  .  Application of wound vac  11/19/2011    Procedure: APPLICATION OF WOUND VAC;  Surgeon: Shann Medal, MD;  Location: [facility];  Service: General;  Laterality: N/A;     Family History  Problem Relation Age of Onset  . Emphysema Paternal Uncle   . Heart disease Mother   . Colon cancer Mother   . Pancreatic cancer Father   . Cancer Father 9    pancreatic cancer  . Tuberculosis Paternal Uncle   . Breast cancer Maternal Aunt   . Breast cancer Cousin   . Prostate cancer Cousin   . Colon cancer Cousin   . Uterine cancer Cousin      History   Social History  . Marital Status: Widowed    Spouse Name: N/A  . Number of Children: 3  . Years of Education: N/A   Occupational History  . Home Maker    Social History Main Topics  . Smoking status: Former Smoker -- 0.50 packs/day for 21 years    Types: Cigarettes    Quit date: 06/20/1977  . Smokeless tobacco: Never Used  . Alcohol Use: 4.2 oz/week    7 Glasses of wine, 0 Standard drinks or equivalent per week     Comment: 1 glass wine/night  . Drug Use: No  . Sexual Activity: Not on file   Other Topics Concern  . Not on file   Social History Narrative   ** Merged History Encounter **         BP 146/58 mmHg  Pulse 68  Physical Exam:  Chronically ill appearing 81 yo woman, wearing oxygen, NAD HEENT: Unremarkable Neck:  7 cm JVD, no thyromegally Back:  No CVA tenderness Lungs:  Clear with no wheezes,  HEART:  Regular rate rhythm, no murmurs, no rubs, no clicks Abd:  soft, positive bowel sounds, no organomegally, no rebound, no guarding Ext:  2 plus pulses, no edema, no cyanosis, no clubbing Skin:  No rashes no nodules Neuro:  CN II through XII intact, motor grossly intact  DEVICE  Normal device function.  See PaceArt for details. Device is at [facility].  Assess/Plan:

## 2014-12-23 NOTE — Telephone Encounter (Signed)
Office visit 12/23/14 with Dr. Lovena Le.

## 2014-12-23 NOTE — Assessment & Plan Note (Signed)
I have spent 45 minutes counseling the patient regarding the issues of PM generator change out. She has approx 2-3 months before her device is at EOL and quits working altogether. She has no escape rhythm. The central question is in regard to her duodenum. If she has a malignancy with a short life expectancy, we might consider not changing out her PPM. If her biopsy is benign, then we would plan to undergo generator change out, albeit at increase risk with her multiple comorbidities.

## 2014-12-23 NOTE — Patient Instructions (Signed)
Medication Instructions: - no changes  Labwork: - none  Procedures/Testing: - Your physician has recommended that you have a pacemaker generator change. We will touch base with you at the beginning of next week to arrange this for Friday 01/23/15 (typically we do pre-procedure labs 1 week prior- this would be around Friday 01/16/15)- Kelly/ Nira Conn will call you to schedule.  Follow-Up: - Pending- post procedure  Any Additional Special Instructions Will Be Listed Below (If Applicable). - none

## 2014-12-23 NOTE — Assessment & Plan Note (Signed)
This was discontinued due to her bleeding. She is no longer a candidate for anti-coagulation.

## 2014-12-24 ENCOUNTER — Ambulatory Visit (HOSPITAL_BASED_OUTPATIENT_CLINIC_OR_DEPARTMENT_OTHER): Payer: Medicare Other

## 2014-12-24 ENCOUNTER — Encounter (HOSPITAL_COMMUNITY): Payer: Self-pay | Admitting: *Deleted

## 2014-12-24 ENCOUNTER — Ambulatory Visit: Payer: Medicare Other | Admitting: Radiation Oncology

## 2014-12-24 ENCOUNTER — Other Ambulatory Visit (HOSPITAL_BASED_OUTPATIENT_CLINIC_OR_DEPARTMENT_OTHER): Payer: Medicare Other

## 2014-12-24 ENCOUNTER — Ambulatory Visit: Payer: Medicare Other

## 2014-12-24 ENCOUNTER — Other Ambulatory Visit: Payer: Medicare Other

## 2014-12-24 ENCOUNTER — Other Ambulatory Visit: Payer: Self-pay | Admitting: Hematology and Oncology

## 2014-12-24 VITALS — BP 114/45 | HR 65 | Temp 98.6°F | Resp 19

## 2014-12-24 DIAGNOSIS — D509 Iron deficiency anemia, unspecified: Secondary | ICD-10-CM | POA: Diagnosis not present

## 2014-12-24 DIAGNOSIS — D638 Anemia in other chronic diseases classified elsewhere: Secondary | ICD-10-CM

## 2014-12-24 DIAGNOSIS — Z95828 Presence of other vascular implants and grafts: Secondary | ICD-10-CM

## 2014-12-24 LAB — CBC & DIFF AND RETIC
BASO%: 0.2 % (ref 0.0–2.0)
Basophils Absolute: 0 10*3/uL (ref 0.0–0.1)
EOS%: 1.4 % (ref 0.0–7.0)
Eosinophils Absolute: 0.1 10*3/uL (ref 0.0–0.5)
HCT: 22.6 % — ABNORMAL LOW (ref 34.8–46.6)
HEMOGLOBIN: 7.2 g/dL — AB (ref 11.6–15.9)
IMMATURE RETIC FRACT: 13.9 % — AB (ref 1.60–10.00)
LYMPH#: 1.4 10*3/uL (ref 0.9–3.3)
LYMPH%: 21.6 % (ref 14.0–49.7)
MCH: 30.5 pg (ref 25.1–34.0)
MCHC: 31.9 g/dL (ref 31.5–36.0)
MCV: 95.8 fL (ref 79.5–101.0)
MONO#: 0.6 10*3/uL (ref 0.1–0.9)
MONO%: 8.9 % (ref 0.0–14.0)
NEUT#: 4.5 10*3/uL (ref 1.5–6.5)
NEUT%: 67.9 % (ref 38.4–76.8)
Platelets: 185 10*3/uL (ref 145–400)
RBC: 2.36 10*6/uL — ABNORMAL LOW (ref 3.70–5.45)
RDW: 17.1 % — ABNORMAL HIGH (ref 11.2–14.5)
RETIC CT ABS: 103.13 10*3/uL — AB (ref 33.70–90.70)
Retic %: 4.37 % — ABNORMAL HIGH (ref 0.70–2.10)
WBC: 6.6 10*3/uL (ref 3.9–10.3)
nRBC: 1 % — ABNORMAL HIGH (ref 0–0)

## 2014-12-24 LAB — COMPREHENSIVE METABOLIC PANEL (CC13)
ALBUMIN: 3.3 g/dL — AB (ref 3.5–5.0)
ALT: 29 U/L (ref 0–55)
ANION GAP: 7 meq/L (ref 3–11)
AST: 23 U/L (ref 5–34)
Alkaline Phosphatase: 42 U/L (ref 40–150)
BUN: 28.6 mg/dL — ABNORMAL HIGH (ref 7.0–26.0)
CALCIUM: 9.9 mg/dL (ref 8.4–10.4)
CHLORIDE: 108 meq/L (ref 98–109)
CO2: 24 meq/L (ref 22–29)
Creatinine: 1.1 mg/dL (ref 0.6–1.1)
EGFR: 50 mL/min/{1.73_m2} — ABNORMAL LOW (ref >90)
Glucose: 98 mg/dl (ref 70–140)
Potassium: 4.4 mEq/L (ref 3.5–5.1)
SODIUM: 139 meq/L (ref 136–145)
TOTAL PROTEIN: 5.2 g/dL — AB (ref 6.4–8.3)
Total Bilirubin: 0.55 mg/dL (ref 0.20–1.20)

## 2014-12-24 LAB — TECHNOLOGIST REVIEW

## 2014-12-24 LAB — PREPARE RBC (CROSSMATCH)

## 2014-12-24 LAB — HOLD TUBE, BLOOD BANK

## 2014-12-24 MED ORDER — FUROSEMIDE 10 MG/ML IJ SOLN
INTRAMUSCULAR | Status: AC
  Filled 2014-12-24: qty 2

## 2014-12-24 MED ORDER — ACETAMINOPHEN 325 MG PO TABS
ORAL_TABLET | ORAL | Status: AC
  Filled 2014-12-24: qty 2

## 2014-12-24 MED ORDER — SODIUM CHLORIDE 0.9 % IJ SOLN
10.0000 mL | INTRAMUSCULAR | Status: DC | PRN
  Administered 2014-12-24: 10 mL via INTRAVENOUS
  Filled 2014-12-24: qty 10

## 2014-12-24 MED ORDER — HEPARIN SOD (PORK) LOCK FLUSH 100 UNIT/ML IV SOLN
500.0000 [IU] | Freq: Every day | INTRAVENOUS | Status: AC | PRN
  Administered 2014-12-24: 500 [IU]
  Filled 2014-12-24: qty 5

## 2014-12-24 MED ORDER — DIPHENHYDRAMINE HCL 25 MG PO CAPS
ORAL_CAPSULE | ORAL | Status: AC
  Filled 2014-12-24: qty 1

## 2014-12-24 MED ORDER — FUROSEMIDE 10 MG/ML IJ SOLN
20.0000 mg | Freq: Once | INTRAMUSCULAR | Status: AC
  Administered 2014-12-24: 20 mg via INTRAVENOUS

## 2014-12-24 MED ORDER — DIPHENHYDRAMINE HCL 25 MG PO CAPS
25.0000 mg | ORAL_CAPSULE | Freq: Once | ORAL | Status: AC
  Administered 2014-12-24: 25 mg via ORAL

## 2014-12-24 MED ORDER — SODIUM CHLORIDE 0.9 % IV SOLN
250.0000 mL | Freq: Once | INTRAVENOUS | Status: AC
  Administered 2014-12-24: 250 mL via INTRAVENOUS

## 2014-12-24 MED ORDER — SODIUM CHLORIDE 0.9 % IJ SOLN
10.0000 mL | INTRAMUSCULAR | Status: AC | PRN
  Administered 2014-12-24: 10 mL
  Filled 2014-12-24: qty 10

## 2014-12-24 MED ORDER — ACETAMINOPHEN 325 MG PO TABS
650.0000 mg | ORAL_TABLET | Freq: Once | ORAL | Status: AC
  Administered 2014-12-24: 650 mg via ORAL

## 2014-12-24 NOTE — Patient Instructions (Signed)

## 2014-12-24 NOTE — Anesthesia Preprocedure Evaluation (Signed)
Anesthesia Evaluation  Patient identified by MRN, date of birth, ID band Patient awake    Reviewed: Allergy & Precautions, NPO status , Patient's Chart, lab work & pertinent test results  History of Anesthesia Complications (+) PONVNegative for: history of anesthetic complications  Airway Mallampati: I       Dental  (+) Teeth Intact   Pulmonary COPD COPD inhaler, former smoker,    Pulmonary exam normal       Cardiovascular hypertension, Pt. on medications + dysrhythmias + pacemaker Rhythm:Irregular Rate:Normal     Neuro/Psych PSYCHIATRIC DISORDERS Depression negative neurological ROS     GI/Hepatic Neg liver ROS,   Endo/Other  Hypothyroidism   Renal/GU Renal disease     Musculoskeletal   Abdominal   Peds  Hematology   Anesthesia Other Findings   Reproductive/Obstetrics                             Anesthesia Physical  Anesthesia Plan  ASA: III  Anesthesia Plan: MAC   Post-op Pain Management:    Induction:   Airway Management Planned: Natural Airway and Nasal Cannula  Additional Equipment:   Intra-op Plan:   Post-operative Plan:   Informed Consent: I have reviewed the patients History and Physical, chart, labs and discussed the procedure including the risks, benefits and alternatives for the proposed anesthesia with the patient or authorized representative who has indicated his/her understanding and acceptance.   Dental advisory given  Plan Discussed with: CRNA and Anesthesiologist  Anesthesia Plan Comments:         Anesthesia Quick Evaluation

## 2014-12-24 NOTE — Patient Instructions (Signed)

## 2014-12-25 ENCOUNTER — Ambulatory Visit (HOSPITAL_COMMUNITY)
Admission: RE | Admit: 2014-12-25 | Discharge: 2014-12-25 | Disposition: A | Discharge status: Home / Self Care | Payer: Medicare Other | Source: Ambulatory Visit | Attending: Gastroenterology | Admitting: Gastroenterology

## 2014-12-25 ENCOUNTER — Ambulatory Visit (HOSPITAL_COMMUNITY): Payer: Medicare Other | Admitting: Anesthesiology

## 2014-12-25 ENCOUNTER — Encounter (HOSPITAL_COMMUNITY): Payer: Self-pay | Admitting: Certified Registered Nurse Anesthetist

## 2014-12-25 ENCOUNTER — Encounter (HOSPITAL_COMMUNITY)
Admission: RE | Disposition: A | Discharge status: Home / Self Care | Payer: Self-pay | Source: Ambulatory Visit | Attending: Gastroenterology

## 2014-12-25 DIAGNOSIS — J449 Chronic obstructive pulmonary disease, unspecified: Secondary | ICD-10-CM | POA: Insufficient documentation

## 2014-12-25 DIAGNOSIS — I10 Essential (primary) hypertension: Secondary | ICD-10-CM | POA: Insufficient documentation

## 2014-12-25 DIAGNOSIS — Z79899 Other long term (current) drug therapy: Secondary | ICD-10-CM | POA: Insufficient documentation

## 2014-12-25 DIAGNOSIS — K2970 Gastritis, unspecified, without bleeding: Secondary | ICD-10-CM | POA: Insufficient documentation

## 2014-12-25 DIAGNOSIS — D132 Benign neoplasm of duodenum: Secondary | ICD-10-CM | POA: Diagnosis not present

## 2014-12-25 DIAGNOSIS — Z853 Personal history of malignant neoplasm of breast: Secondary | ICD-10-CM | POA: Insufficient documentation

## 2014-12-25 DIAGNOSIS — J189 Pneumonia, unspecified organism: Secondary | ICD-10-CM | POA: Insufficient documentation

## 2014-12-25 DIAGNOSIS — Z7951 Long term (current) use of inhaled steroids: Secondary | ICD-10-CM | POA: Diagnosis not present

## 2014-12-25 DIAGNOSIS — F329 Major depressive disorder, single episode, unspecified: Secondary | ICD-10-CM | POA: Insufficient documentation

## 2014-12-25 DIAGNOSIS — Z950 Presence of cardiac pacemaker: Secondary | ICD-10-CM | POA: Diagnosis not present

## 2014-12-25 DIAGNOSIS — E538 Deficiency of other specified B group vitamins: Secondary | ICD-10-CM | POA: Insufficient documentation

## 2014-12-25 DIAGNOSIS — E039 Hypothyroidism, unspecified: Secondary | ICD-10-CM | POA: Insufficient documentation

## 2014-12-25 DIAGNOSIS — Z7952 Long term (current) use of systemic steroids: Secondary | ICD-10-CM | POA: Diagnosis not present

## 2014-12-25 DIAGNOSIS — K3189 Other diseases of stomach and duodenum: Secondary | ICD-10-CM | POA: Diagnosis not present

## 2014-12-25 DIAGNOSIS — Z923 Personal history of irradiation: Secondary | ICD-10-CM | POA: Insufficient documentation

## 2014-12-25 DIAGNOSIS — D509 Iron deficiency anemia, unspecified: Secondary | ICD-10-CM | POA: Diagnosis not present

## 2014-12-25 DIAGNOSIS — Z87891 Personal history of nicotine dependence: Secondary | ICD-10-CM | POA: Insufficient documentation

## 2014-12-25 DIAGNOSIS — N289 Disorder of kidney and ureter, unspecified: Secondary | ICD-10-CM | POA: Insufficient documentation

## 2014-12-25 DIAGNOSIS — Z85820 Personal history of malignant melanoma of skin: Secondary | ICD-10-CM | POA: Diagnosis not present

## 2014-12-25 DIAGNOSIS — Z933 Colostomy status: Secondary | ICD-10-CM | POA: Diagnosis not present

## 2014-12-25 DIAGNOSIS — J9610 Chronic respiratory failure, unspecified whether with hypoxia or hypercapnia: Secondary | ICD-10-CM | POA: Diagnosis not present

## 2014-12-25 DIAGNOSIS — D638 Anemia in other chronic diseases classified elsewhere: Secondary | ICD-10-CM | POA: Insufficient documentation

## 2014-12-25 DIAGNOSIS — Z792 Long term (current) use of antibiotics: Secondary | ICD-10-CM | POA: Insufficient documentation

## 2014-12-25 HISTORY — PX: ESOPHAGOGASTRODUODENOSCOPY (EGD) WITH PROPOFOL: SHX5813

## 2014-12-25 HISTORY — DX: Dependence on supplemental oxygen: Z99.81

## 2014-12-25 HISTORY — DX: Personal history of other medical treatment: Z92.89

## 2014-12-25 LAB — CBC
HEMATOCRIT: 29.4 % — AB (ref 36.0–46.0)
HEMOGLOBIN: 9.7 g/dL — AB (ref 12.0–15.0)
MCH: 30.1 pg (ref 26.0–34.0)
MCHC: 33 g/dL (ref 30.0–36.0)
MCV: 91.3 fL (ref 78.0–100.0)
Platelets: 137 10*3/uL — ABNORMAL LOW (ref 150–400)
RBC: 3.22 MIL/uL — ABNORMAL LOW (ref 3.87–5.11)
RDW: 16.6 % — ABNORMAL HIGH (ref 11.5–15.5)
WBC: 5.6 10*3/uL (ref 4.0–10.5)

## 2014-12-25 LAB — TYPE AND SCREEN
ABO/RH(D): A POS
ANTIBODY SCREEN: NEGATIVE
UNIT DIVISION: 0
Unit division: 0

## 2014-12-25 SURGERY — ESOPHAGOGASTRODUODENOSCOPY (EGD) WITH PROPOFOL
Anesthesia: Monitor Anesthesia Care

## 2014-12-25 MED ORDER — KETAMINE HCL 10 MG/ML IJ SOLN
INTRAMUSCULAR | Status: DC | PRN
  Administered 2014-12-25: 20 mg via INTRAVENOUS

## 2014-12-25 MED ORDER — ONDANSETRON HCL 4 MG/2ML IJ SOLN
INTRAMUSCULAR | Status: AC
  Filled 2014-12-25: qty 2

## 2014-12-25 MED ORDER — GLUCAGON HCL RDNA (DIAGNOSTIC) 1 MG IJ SOLR
INTRAMUSCULAR | Status: AC
  Filled 2014-12-25: qty 1

## 2014-12-25 MED ORDER — PROPOFOL INFUSION 10 MG/ML OPTIME
INTRAVENOUS | Status: DC | PRN
  Administered 2014-12-25: 200 ug/kg/min via INTRAVENOUS

## 2014-12-25 MED ORDER — LIDOCAINE HCL (CARDIAC) 20 MG/ML IV SOLN
INTRAVENOUS | Status: DC | PRN
  Administered 2014-12-25: 30 mg via INTRAVENOUS

## 2014-12-25 MED ORDER — PROPOFOL 10 MG/ML IV BOLUS
INTRAVENOUS | Status: AC
  Filled 2014-12-25: qty 20

## 2014-12-25 MED ORDER — HEPARIN SOD (PORK) LOCK FLUSH 100 UNIT/ML IV SOLN
500.0000 [IU] | INTRAVENOUS | Status: AC | PRN
  Administered 2014-12-25: 500 [IU]

## 2014-12-25 MED ORDER — SODIUM CHLORIDE 0.9 % IV SOLN
INTRAVENOUS | Status: DC
  Administered 2014-12-25: 11:00:00 via INTRAVENOUS

## 2014-12-25 MED ORDER — LIDOCAINE HCL (CARDIAC) 20 MG/ML IV SOLN
INTRAVENOUS | Status: AC
  Filled 2014-12-25: qty 5

## 2014-12-25 MED ORDER — EPHEDRINE SULFATE 50 MG/ML IJ SOLN
INTRAMUSCULAR | Status: AC
  Filled 2014-12-25: qty 1

## 2014-12-25 MED ORDER — SODIUM CHLORIDE 0.9 % IJ SOLN: 10.0000 mL | Freq: Two times a day (BID) | INTRAMUSCULAR | Status: DC

## 2014-12-25 MED ORDER — SODIUM CHLORIDE 0.9 % IJ SOLN
10.0000 mL | INTRAMUSCULAR | Status: DC | PRN
Start: 2014-12-25 — End: 2014-12-26
  Administered 2014-12-25: 10 mL

## 2014-12-25 MED ORDER — SODIUM CHLORIDE 0.9 % IJ SOLN
INTRAMUSCULAR | Status: AC
  Filled 2014-12-25: qty 10

## 2014-12-25 MED ORDER — ONDANSETRON HCL 4 MG/2ML IJ SOLN
INTRAMUSCULAR | Status: DC | PRN
  Administered 2014-12-25: 4 mg via INTRAVENOUS

## 2014-12-25 MED ORDER — LACTATED RINGERS IV SOLN: INTRAVENOUS | Status: DC

## 2014-12-25 SURGICAL SUPPLY — 14 items

## 2014-12-25 NOTE — Op Note (Signed)
[facility] [place], [address]   ENDOSCOPY PROCEDURE REPORT  PATIENT: [name], [name]  MR#: [id number] BIRTHDATE: 1933/09/17 , 80  yrs. old GENDER: female ENDOSCOPIST: Milus Banister, MD REFERRED BY: PROCEDURE DATE:  12/25/2014 PROCEDURE:  EGD w/ biopsy, EGD with endoclip placement ASA CLASS:     Class III INDICATIONS:  known duodenal mass; here for repeat biopsy and labeling the site with clips. MEDICATIONS: Monitored anesthesia care TOPICAL ANESTHETIC: none  DESCRIPTION OF PROCEDURE: After the risks benefits and alternatives of the procedure were thoroughly explained, informed consent was obtained.  The Pentax Gastroscope Peds [id number] endoscope was introduced through the mouth and advanced to the second portion of the duodenum , Without limitations.  The instrument was slowly withdrawn as the mucosa was fully examined.  There was moderate, non-specific pangastritis.  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  This was 3cm, nearly circumferential, friable, clearly neoplastic and likely malignant.  The mass was biopsied extensively and then the proximal edge of the mass was labeled with placement of 2 endoclips.  Retroflexed views revealed no abnormalities and Retroflexed views revealed .     The scope was then withdrawn from the patient and the procedure completed.  COMPLICATIONS: There were no immediate complications.  ENDOSCOPIC IMPRESSION: There was moderate, non-specific pangastritis.  The mass in duodenum was located in the  3rd duodenum.  This was 3cm, nearly circumferential, friable, clearly neoplastic and likely malignant. The mass was biopsied extensively and then the proximal edge of the mass was labeled with placement of 2 endoclips  RECOMMENDATIONS: Await final pathology.   eSigned:  Milus Banister, MD 12/25/2014 11:04 AM    CC: Dr. Kyung Rudd, Dr. Heath Lark, Dr. Zenovia Jarred

## 2014-12-25 NOTE — Interval H&P Note (Signed)
History and Physical Interval Note:  12/25/2014 10:00 AM  [name]  has presented today for surgery, with the diagnosis of duodenal mass  The various methods of treatment have been discussed with the patient and family. After consideration of risks, benefits and other options for treatment, the patient has consented to  Procedure(s): ESOPHAGOGASTRODUODENOSCOPY (EGD) WITH PROPOFOL (N/A) as a surgical intervention .  The patient's history has been reviewed, patient examined, no change in status, stable for surgery.  I have reviewed the patient's chart and labs.  Questions were answered to the patient's satisfaction.     Milus Banister

## 2014-12-25 NOTE — Transfer of Care (Signed)
Immediate Anesthesia Transfer of Care Note  Patient: [name]  Procedure(s) Performed: Procedure(s): ESOPHAGOGASTRODUODENOSCOPY (EGD) WITH PROPOFOL (N/A)  Patient Location: PACU  Anesthesia Type:MAC  Level of Consciousness:  sedated, patient cooperative and responds to stimulation  Airway & Oxygen Therapy:Patient Spontanous Breathing and Patient connected to face mask oxgen  Post-op Assessment:  Report given to PACU RN and Post -op Vital signs reviewed and stable  Post vital signs:  Reviewed and stable  Last Vitals:  Filed Vitals:   12/25/14 0954  BP: 128/41  Pulse: 65  Temp: 36.8 C  Resp: 24    Complications: No apparent anesthesia complications

## 2014-12-25 NOTE — Discharge Instructions (Signed)
YOU HAD AN ENDOSCOPIC PROCEDURE TODAY: Refer to the procedure report that was given to you for any specific questions about what was found during the examination.  If the procedure report does not answer your questions, please call your gastroenterologist to clarify. ° °YOU SHOULD EXPECT: Some feelings of bloating in the abdomen. Passage of more gas than usual.  Walking can help get rid of the air that was put into your GI tract during the procedure and reduce the bloating. If you had a lower endoscopy (such as a colonoscopy or flexible sigmoidoscopy) you may notice spotting of blood in your stool or on the toilet paper.  ° °DIET: Your first meal following the procedure should be a light meal and then it is ok to progress to your normal diet.  A half-sandwich or bowl of soup is an example of a good first meal.  Heavy or fried foods are harder to digest and may make you feel nasueas or bloated.  Drink plenty of fluids but you should avoid alcoholic beverages for 24 hours. ° °ACTIVITY: Your care partner should take you home directly after the procedure.  You should plan to take it easy, moving slowly for the rest of the day.  You can resume normal activity the day after the procedure however you should NOT DRIVE or use heavy machinery for 24 hours (because of the sedation medicines used during the test).   ° °SYMPTOMS TO REPORT IMMEDIATELY  °A gastroenterologist can be reached at any hour.  Please call your doctor's office for any of the following symptoms: ° °· Following lower endoscopy (colonoscopy, flexible sigmoidoscopy) ° Excessive amounts of blood in the stool ° Significant tenderness, worsening of abdominal pains ° Swelling of the abdomen that is new, acute ° Fever of 100° or higher °· Following upper endoscopy (EGD, EUS, ERCP) ° Vomiting of blood or coffee ground material ° New, significant abdominal pain ° New, significant chest pain or pain under the shoulder blades ° Painful or persistently difficult  swallowing ° New shortness of breath ° Black, tarry-looking stools ° °FOLLOW UP: °If any biopsies were taken you will be contacted by phone or by letter within the next 1-3 weeks.  Call your gastroenterologist if you have not heard about the biopsies in 3 weeks.  °Please also call your gastroenterologist's office with any specific questions about appointments or follow up tests. ° ° °Esophagogastroduodenoscopy °Care After °Refer to this sheet in the next few weeks. These instructions provide you with information on caring for yourself after your procedure. Your caregiver may also give you more specific instructions. Your treatment has been planned according to current medical practices, but problems sometimes occur. Call your caregiver if you have any problems or questions after your procedure.  °HOME CARE INSTRUCTIONS °· Do not eat or drink anything until the numbing medicine (local anesthetic) has worn off and your gag reflex has returned. You will know that the local anesthetic has worn off when you can swallow comfortably. °· Do not drive for 12 hours after the procedure or as directed by your caregiver. °· Only take medicines as directed by your caregiver. °SEEK MEDICAL CARE IF:  °· You cannot stop coughing. °· You are not urinating at all or less than usual. °SEEK IMMEDIATE MEDICAL CARE IF: °· You have difficulty swallowing. °· You cannot eat or drink. °· You have worsening throat or chest pain. °· You have dizziness, lightheadedness, or you faint. °· You have nausea or vomiting. °· You have   chills. °· You have a fever. °· You have severe abdominal pain. °· You have black, tarry, or bloody stools. °Document Released: 05/23/2012 Document Reviewed: 05/23/2012 °[employer]® Patient Information ©2015 [employer]. This information is not intended to replace advice given to you by your health care provider. Make sure you discuss any questions you have with your health care provider. ° ° °Conscious Sedation, Adult,  Care After °Refer to this sheet in the next few weeks. These instructions provide you with information on caring for yourself after your procedure. Your health care provider may also give you more specific instructions. Your treatment has been planned according to current medical practices, but problems sometimes occur. Call your health care provider if you have any problems or questions after your procedure. °WHAT TO EXPECT AFTER THE PROCEDURE  °After your procedure: °· You may feel sleepy, clumsy, and have poor balance for several hours. °· Vomiting may occur if you eat too soon after the procedure. °HOME CARE INSTRUCTIONS °· Do not participate in any activities where you could become injured for at least 24 hours. Do not: °¨ Drive. °¨ Swim. °¨ Ride a bicycle. °¨ Operate heavy machinery. °¨ Cook. °¨ Use power tools. °¨ Climb ladders. °¨ Work from a high place. °· Do not make important decisions or sign legal documents until you are improved. °· If you vomit, drink water, juice, or soup when you can drink without vomiting. Make sure you have little or no nausea before eating solid foods. °· Only take over-the-counter or prescription medicines for pain, discomfort, or fever as directed by your health care provider. °· Make sure you and your family fully understand everything about the medicines given to you, including what side effects may occur. °· You should not drink alcohol, take sleeping pills, or take medicines that cause drowsiness for at least 24 hours. °· If you smoke, do not smoke without supervision. °· If you are feeling better, you may resume normal activities 24 hours after you were sedated. °· Keep all appointments with your health care provider. °SEEK MEDICAL CARE IF: °· Your skin is pale or bluish in color. °· You continue to feel nauseous or vomit. °· Your pain is getting worse and is not helped by medicine. °· You have bleeding or swelling. °· You are still sleepy or feeling clumsy after 24  hours. °SEEK IMMEDIATE MEDICAL CARE IF: °· You develop a rash. °· You have difficulty breathing. °· You develop any type of allergic problem. °· You have a fever. °MAKE SURE YOU: °· Understand these instructions. °· Will watch your condition. °· Will get help right away if you are not doing well or get worse. °Document Released: 03/27/2013 Document Reviewed: 03/27/2013 °[employer]® Patient Information ©2015 [employer]. This information is not intended to replace advice given to you by your health care provider. Make sure you discuss any questions you have with your health care provider. ° °

## 2014-12-25 NOTE — H&P (View-Only) (Signed)
[place] OFFICE PROGRESS NOTE  Patient Care Team: Prince Solian, MD as PCP - General (Internal Medicine) Gatha Mayer, MD as Consulting Physician (Gastroenterology) Evans Lance, MD as Consulting Physician (Cardiology) Collene Gobble, MD as Consulting Physician (Pulmonary Disease) Jiles Prows, MD as Attending Physician ([place]) Heath Lark, MD as Consulting Physician (Hematology and Oncology) Prince Solian, MD  SUMMARY OF ONCOLOGIC HISTORY:  This patient have remote history of breast cancer diagnosed from screening mammogram in 2003. She had surgery, radiation therapy and adjuvant tamoxifen. 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 She had received B12 injection for B12 deficiency and intravenous iron for iron deficiency anemia. The patient had history of allergic reaction to intravenous iron dextran. She received intravenous iron feraheme in November 5027 without complications. In May 2016, she was found to have melanoma and significant GI bleed. On 11/24/2014, EGD and small bowel endoscopy revealed gastritis, AVM and duodenal mass. Biopsy showed nonmalignant tubular adenoma.Accession: [id number] CT scan of the abdomen dated 11/13/2014 show no retroperitoneal or other acute hemorrhage. Descending colostomy with peristomal hernia containing nonobstructed bowel. Possible cirrhosis She have recurrent admission to the hospital for severe anemia.  INTERVAL HISTORY: Please see below for problem oriented charting. She returns for further follow-up. Her case was recently presented at GI tumor board with plan for repeat endoscopy, biopsy and placement of clip. If biopsy is malignant, radiation therapy can be offered. If not, possibility of embolization of blood vessel would be an option. She is not sure if she wants to proceed. She was informed her pacemaker battery needs to be replaced. The  patient denies any recent signs or symptoms of bleeding such as spontaneous epistaxis, hematuria or hematochezia. She feels weak, dyspneic with recurrent non-productive cough and wheezing REVIEW OF SYSTEMS:   Constitutional: Denies fevers, chills or abnormal weight loss Eyes: Denies blurriness of vision Ears, nose, mouth, throat, and face: Denies mucositis or sore throat Cardiovascular: Denies palpitation, chest discomfort or lower extremity swelling Gastrointestinal:  Denies nausea, heartburn or change in bowel habits Skin: Denies abnormal skin rashes Lymphatics: Denies new lymphadenopathy or easy bruising Neurological:Denies numbness, tingling or new weaknesses Behavioral/Psych: Mood is stable, no new changes  All other systems were reviewed with the patient and are negative.  I have reviewed the past medical history, past surgical history, social history and family history with the patient and they are unchanged from previous note.  ALLERGIES:  is allergic to aspirin; sulfonamide derivatives; aspirin; ibuprofen; iron dextran; sulfa antibiotics; sulfa antibiotics; tape; and metoclopramide hcl.  MEDICATIONS:  Current Outpatient Prescriptions  Medication Sig Dispense Refill  . acetaminophen (TYLENOL) 325 MG tablet Take 650 mg by mouth every 6 (six) hours as needed for mild pain.     [name] albuterol (PROVENTIL HFA;VENTOLIN HFA) 108 (90 BASE) MCG/ACT inhaler Inhale 2 puffs into the lungs 2 (two) times daily.    [name] albuterol (PROVENTIL) (2.5 MG/3ML) 0.083% nebulizer solution Take 2.5 mg by nebulization every 4 (four) hours as needed for wheezing or shortness of breath.    . ALPRAZolam (XANAX) 0.25 MG tablet Take 0.25 mg by mouth at bedtime.     . baclofen (LIORESAL) 10 MG tablet Take 10 mg by mouth every 8 (eight) hours as needed for muscle spasms.     . beclomethasone (QVAR) 80 MCG/ACT inhaler Inhale 3 puffs into the lungs 2 (two) times daily.     . budesonide (RHINOCORT AQUA) 32  MCG/ACT nasal  spray Place 2 sprays into both nostrils daily.    . Calcium Carbonate-Vitamin D 600-400 MG-UNIT per tablet Take 1 tablet by mouth daily.    . Carboxymethylcellulose Sodium (LUBRICANT EYE DROPS OP) Apply 1 drop to eye daily.    [name] DEXILANT 60 MG capsule Take 60 mg by mouth daily before breakfast.  3  . diphenhydrAMINE (BENADRYL) 25 mg capsule Take 25 mg by mouth daily as needed for itching.     . DULoxetine (CYMBALTA) 30 MG capsule Take 30 mg by mouth at bedtime.  3  . EPINEPHrine (EPIPEN IJ) Inject 1 application as directed as needed.     . Eszopiclone 3 MG TABS Take 3 mg by mouth daily as needed (sleep).   2  . furosemide (LASIX) 40 MG tablet Take 40 mg by mouth daily.    [name] gabapentin (NEURONTIN) 100 MG capsule Take 100 mg by mouth daily.    [name] guaiFENesin-dextromethorphan (ROBITUSSIN DM) 100-10 MG/5ML syrup Take 15 mLs by mouth every 4 (four) hours as needed for cough.    . hydrocortisone cream 1 % Apply 1 application topically daily as needed for itching.    [name] ketoconazole (NIZORAL) 2 % cream Apply topically every morning. Apply to affected area (rash).  6  . levothyroxine (SYNTHROID, LEVOTHROID) 50 MCG tablet Take 50 mcg by mouth daily.  3  . losartan (COZAAR) 50 MG tablet Take 50 mg by mouth daily.    . metoprolol succinate (TOPROL-XL) 50 MG 24 hr tablet Take 50 mg by mouth daily.  1  . mirtazapine (REMERON) 15 MG tablet Take 15 mg by mouth at bedtime.   3  . Multiple Vitamins-Minerals (MULTIVITAMIN & MINERAL PO) Take 1 tablet by mouth daily.    . mupirocin cream (BACTROBAN) 2 % Apply 1 application topically 2 (two) times daily.    [name] oxycodone (OXY-IR) 5 MG capsule Take 5 mg by mouth every 4 (four) hours as needed for pain.    . polyethylene glycol (MIRALAX / GLYCOLAX) packet Take 17 g by mouth daily as needed for mild constipation or moderate constipation.    . Polyvinyl Alcohol (LIQUID TEARS OP) Apply 2 drops to eye 2 (two) times daily.     . pravastatin (PRAVACHOL) 40 MG tablet Take 40 mg  by mouth at bedtime.   2  . predniSONE (DELTASONE) 5 MG tablet Take 5 mg by mouth daily with breakfast.     . PROAIR HFA 108 (90 BASE) MCG/ACT inhaler Inhale 2 puffs into the lungs 2 (two) times daily.     [name] QVAR 80 MCG/ACT inhaler Inhale 3 puffs into the lungs 2 (two) times daily.  2  . ranitidine (ZANTAC) 300 MG tablet Take 300 mg by mouth at bedtime.  3  . senna-docusate (SENOKOT-S) 8.6-50 MG per tablet Take 1 tablet by mouth at bedtime.     . traZODone (DESYREL) 50 MG tablet Take 50 mg by mouth as needed for sleep.     . Vitamin D, Ergocalciferol, (DRISDOL) 50000 UNITS CAPS capsule Take 50,000 Units by mouth once a week. Every Sunday  3  . XOLAIR 150 MG injection Inject 150 mg into the skin every 28 (twenty-eight) days.     . zafirlukast (ACCOLATE) 20 MG tablet Take 20 mg by mouth 2 (two) times daily.  3   Current Facility-Administered Medications  Medication Dose Route Frequency Provider Last Rate Last Dose  . sodium chloride 0.9 % injection 10 mL  10 mL Intravenous PRN  Heath Lark, MD   10 mL at 12/17/14 1110    PHYSICAL EXAMINATION: ECOG PERFORMANCE STATUS: 2 - Symptomatic, <50% confined to bed  Filed Vitals:   12/17/14 1010  BP: 147/56  Pulse: 65  Temp: 98.7 F (37.1 C)  Resp: 18   Filed Weights   12/17/14 1010  Weight: 178 lb 14.4 oz (81.149 kg)    GENERAL:alert, no distress and comfortable. She looks debilitated SKIN: skin color, texture, turgor are normal, no rashes or significant lesions with significant bruising EYES: normal, Conjunctiva are pink and non-injected, sclera clear OROPHARYNX:no exudate, no erythema and lips, buccal mucosa, and tongue normal  NECK: supple, thyroid normal size, non-tender, without nodularity LYMPH:  no palpable lymphadenopathy in the cervical, axillary or inguinal LUNGS:diffuse wheezes without crackles. She is using oxygen HEART: regular rate & rhythm and no murmurs and no lower extremity edema ABDOMEN:abdomen soft, non-tender and normal  bowel sounds Musculoskeletal:no cyanosis of digits and no clubbing  NEURO: alert & oriented x 3 with fluent speech, no focal motor/sensory deficits  LABORATORY DATA:  I have reviewed the data as listed    Component Value Date/Time   NA 142 12/10/2014 1004   NA 136 11/26/2014 0550   K 4.0 12/10/2014 1004   K 3.7 11/26/2014 0550   CL 102 11/26/2014 0550   CO2 30* 12/10/2014 1004   CO2 26 11/26/2014 0550   GLUCOSE 99 12/10/2014 1004   GLUCOSE 96 11/26/2014 0550   BUN 33.0* 12/10/2014 1004   BUN 21* 11/26/2014 0550   CREATININE 1.0 12/10/2014 1004   CREATININE 1.12* 11/26/2014 0550   CALCIUM 9.9 12/10/2014 1004   CALCIUM 9.1 11/26/2014 0550   PROT 5.5* 12/10/2014 1004   PROT 4.8* 11/22/2014 0513   ALBUMIN 3.7 12/10/2014 1004   ALBUMIN 3.0* 11/22/2014 0513   AST 26 12/10/2014 1004   AST 21 11/22/2014 0513   ALT 32 12/10/2014 1004   ALT 19 11/22/2014 0513   ALKPHOS 51 12/10/2014 1004   ALKPHOS 36* 11/22/2014 0513   BILITOT 0.78 12/10/2014 1004   BILITOT 1.6* 11/22/2014 0513   GFRNONAA 46* 11/26/2014 0550   GFRAA 53* 11/26/2014 0550    No results found for: SPEP, UPEP  Lab Results  Component Value Date   WBC 8.4 12/17/2014   NEUTROABS 6.9* 12/17/2014   HGB 8.7* 12/17/2014   HCT 25.6* 12/17/2014   MCV 92.3 12/17/2014   PLT 219 12/17/2014      Chemistry      Component Value Date/Time   NA 142 12/10/2014 1004   NA 136 11/26/2014 0550   K 4.0 12/10/2014 1004   K 3.7 11/26/2014 0550   CL 102 11/26/2014 0550   CO2 30* 12/10/2014 1004   CO2 26 11/26/2014 0550   BUN 33.0* 12/10/2014 1004   BUN 21* 11/26/2014 0550   CREATININE 1.0 12/10/2014 1004   CREATININE 1.12* 11/26/2014 0550      Component Value Date/Time   CALCIUM 9.9 12/10/2014 1004   CALCIUM 9.1 11/26/2014 0550   ALKPHOS 51 12/10/2014 1004   ALKPHOS 36* 11/22/2014 0513   AST 26 12/10/2014 1004   AST 21 11/22/2014 0513   ALT 32 12/10/2014 1004   ALT 19 11/22/2014 0513   BILITOT 0.78 12/10/2014 1004    BILITOT 1.6* 11/22/2014 0513      ASSESSMENT & PLAN:  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 She is not sure if  she wants to pursue radiation treatment We discussed possibilities of what would happen if this is malignant. Ultimately I recommend she discuss this further with family members  Anemia in chronic illness The absence of significant reticulocytosis in the presence of recent GI bleed suggested component of anemia of chronic disease. Recent serum vitamin B-12 and iron studies were adequate. Serum erythropoietin level is low I discussed with her the risks and benefits of Aranesp injection and spoke with her daughter over the phone in the presence of the patient. .We discussed some of the risks, benefits, and alternatives of erythropoietin stimulating agents such as Procrit or Aranesp. The patient is symptomatic from anemia and the EPO level is low. Some of the side-effects to be expected including risks of allergic reactions, skin rashes, headaches, risk of blood clots including heart attack and stroke. There is rare risks of causing growth of cancers.The patient is willing to proceed and went ahead to sign consent today. I will start with inj every 2 weeks X 4 and reassess Goal is to keep hemoglobin > 10  Iron deficiency anemia She has confirmed melana and AVM in the past. Recent endoscopy revealed a duodenal mass. Unfortunately, biopsy was non diagnostic although malignancy is suspected. [facility] was [facility] with the patient and at present time she is leaning towards not undergoing aggressive surgical management. I will come for with my radiation oncology colleagues to see if palliative radiation might be an option.. Recent iron studies were unremarkable for severe iron deficiency. I will reserve to only give her IV iron if ferritin is less than 100 She will  continue blood count monitoring weekly and plan would be to give her 1 unit of blood if hemoglobin < 8  Pacemaker at end of battery life Her current pacemake is at the end of battery life. She has planned battery replacement in the near future We discussed whether she should proceed with battery replacement of not given her significant co-morbidities  Palliative care encounter We discussed advance directives and living will She has made contact with palliative care service to continue palliative care at home  Chronic respiratory failure She has severe COPD and recurrent pneumonia. She is on steroids, inhalers and oxygen therapy   No orders of the defined types were placed in this encounter.   All questions were answered. The patient knows to call the clinic with any problems, questions or concerns. No barriers to learning was detected. I spent 40 minutes counseling the patient face to face. The total time spent in the appointment was 55 minutes and more than 50% was on counseling and review of test results     [facility], [place], MD 12/18/2014 9:11 PM

## 2014-12-25 NOTE — Anesthesia Postprocedure Evaluation (Signed)
  Anesthesia Post-op Note  Patient: [name]  Procedure(s) Performed: Procedure(s) (LRB): ESOPHAGOGASTRODUODENOSCOPY (EGD) WITH PROPOFOL (N/A)  Patient Location: PACU  Anesthesia Type: MAC  Level of Consciousness: awake and alert   Airway and Oxygen Therapy: Patient Spontanous Breathing  Post-op Pain: mild  Post-op Assessment: Post-op Vital signs reviewed, Patient's Cardiovascular Status Stable, Respiratory Function Stable, Patent Airway and No signs of Nausea or vomiting  Last Vitals:  Filed Vitals:   12/25/14 0954  BP: 128/41  Pulse: 65  Temp: 36.8 C  Resp: 24    Post-op Vital Signs: stable   Complications: No apparent anesthesia complications

## 2014-12-26 ENCOUNTER — Encounter: Payer: Self-pay | Admitting: Internal Medicine

## 2014-12-26 ENCOUNTER — Encounter (HOSPITAL_COMMUNITY): Payer: Self-pay | Admitting: Gastroenterology

## 2014-12-29 ENCOUNTER — Telehealth: Payer: Self-pay | Admitting: *Deleted

## 2014-12-29 ENCOUNTER — Telehealth: Payer: Self-pay | Admitting: Hematology and Oncology

## 2014-12-29 NOTE — Telephone Encounter (Signed)
I have sent [name] a note with a few open slots and she will schedule

## 2014-12-29 NOTE — Telephone Encounter (Signed)
[name] with Dr. Alvy Bimler information.  [name] "would like to go ahead and schedule for next Thursday or Friday unless Dr. Alvy Bimler will consider phone consultation.  Pacemaker to be done January 23, 2015 so July 27 is late as she'll need time to get her thoughts together. Pacemake already exhibits less functionality and we've been told it's a matter of a few days, weeks or up to a month before it gives out."

## 2014-12-29 NOTE — Telephone Encounter (Signed)
Patient's daughter [name] called with questions of treatment plan in light of the biopsy results.  "We'll see Dr. Lisbeth Renshaw 12-31-2014 and are sure he'll say no radiation needed.  Can Dr. Alvy Bimler call mom or me or do we need to see her within the week after seeing Dr. Lisbeth Renshaw.  She needs to decide if she will have Pacemaker replaced, discuss next steps and options of best quality of life from shots for blood and RT.  Will these things show measurable positive impact on her quality of life?  This will help her determine whether to proceed with pacemaker replacement."

## 2014-12-29 NOTE — Progress Notes (Signed)
Appt. Scheduled for 12/31/14

## 2014-12-29 NOTE — Telephone Encounter (Signed)
I would wait for Dr. Ida Rogue final recommendations Unfortunately, I cannot work her in next Wed; if I have a cancellation I will try to see her that day She is coming in for repeat labs on 7/27 and I have added her to be seen that day If she wants to see me sooner, I can work her in probably [place] or Fri next week but not before

## 2014-12-29 NOTE — Telephone Encounter (Signed)
s.w. pt and advised on 7.18 appt....pt ok and aware

## 2014-12-31 ENCOUNTER — Ambulatory Visit: Payer: Medicare Other

## 2014-12-31 ENCOUNTER — Other Ambulatory Visit (HOSPITAL_BASED_OUTPATIENT_CLINIC_OR_DEPARTMENT_OTHER): Payer: Medicare Other

## 2014-12-31 ENCOUNTER — Ambulatory Visit (HOSPITAL_BASED_OUTPATIENT_CLINIC_OR_DEPARTMENT_OTHER): Payer: Medicare Other

## 2014-12-31 ENCOUNTER — Encounter: Payer: Self-pay | Admitting: Radiation Oncology

## 2014-12-31 ENCOUNTER — Ambulatory Visit
Admission: RE | Admit: 2014-12-31 | Discharge: 2014-12-31 | Disposition: A | Discharge status: Home / Self Care | Payer: Medicare Other | Source: Ambulatory Visit | Attending: Radiation Oncology | Admitting: Radiation Oncology

## 2014-12-31 ENCOUNTER — Ambulatory Visit: Payer: Medicare Other | Admitting: Radiation Oncology

## 2014-12-31 ENCOUNTER — Encounter: Payer: Self-pay | Admitting: Internal Medicine

## 2014-12-31 VITALS — BP 121/46 | HR 65 | Resp 16 | Ht 64.5 in | Wt 175.5 lb

## 2014-12-31 VITALS — BP 122/44 | HR 64 | Temp 98.6°F

## 2014-12-31 DIAGNOSIS — D638 Anemia in other chronic diseases classified elsewhere: Secondary | ICD-10-CM | POA: Diagnosis present

## 2014-12-31 DIAGNOSIS — Z95828 Presence of other vascular implants and grafts: Secondary | ICD-10-CM

## 2014-12-31 DIAGNOSIS — C170 Malignant neoplasm of duodenum: Secondary | ICD-10-CM

## 2014-12-31 DIAGNOSIS — K3189 Other diseases of stomach and duodenum: Secondary | ICD-10-CM

## 2014-12-31 DIAGNOSIS — D518 Other vitamin B12 deficiency anemias: Secondary | ICD-10-CM

## 2014-12-31 DIAGNOSIS — C50919 Malignant neoplasm of unspecified site of unspecified female breast: Secondary | ICD-10-CM | POA: Diagnosis not present

## 2014-12-31 DIAGNOSIS — D509 Iron deficiency anemia, unspecified: Secondary | ICD-10-CM

## 2014-12-31 LAB — CBC & DIFF AND RETIC
BASO%: 0.2 % (ref 0.0–2.0)
BASOS ABS: 0 10*3/uL (ref 0.0–0.1)
EOS ABS: 0.1 10*3/uL (ref 0.0–0.5)
EOS%: 1.8 % (ref 0.0–7.0)
HCT: 27.3 % — ABNORMAL LOW (ref 34.8–46.6)
HEMOGLOBIN: 8.8 g/dL — AB (ref 11.6–15.9)
IMMATURE RETIC FRACT: 5.9 % (ref 1.60–10.00)
LYMPH#: 1 10*3/uL (ref 0.9–3.3)
LYMPH%: 21.6 % (ref 14.0–49.7)
MCH: 30.4 pg (ref 25.1–34.0)
MCHC: 32.2 g/dL (ref 31.5–36.0)
MCV: 94.5 fL (ref 79.5–101.0)
MONO#: 0.4 10*3/uL (ref 0.1–0.9)
MONO%: 9.3 % (ref 0.0–14.0)
NEUT#: 2.9 10*3/uL (ref 1.5–6.5)
NEUT%: 67.1 % (ref 38.4–76.8)
PLATELETS: 99 10*3/uL — AB (ref 145–400)
RBC: 2.89 10*6/uL — ABNORMAL LOW (ref 3.70–5.45)
RDW: 16.4 % — ABNORMAL HIGH (ref 11.2–14.5)
Retic %: 2.29 % — ABNORMAL HIGH (ref 0.70–2.10)
Retic Ct Abs: 66.18 10*3/uL (ref 33.70–90.70)
WBC: 4.4 10*3/uL (ref 3.9–10.3)

## 2014-12-31 LAB — HOLD TUBE, BLOOD BANK

## 2014-12-31 MED ORDER — DARBEPOETIN ALFA 300 MCG/0.6ML IJ SOSY
300.0000 ug | PREFILLED_SYRINGE | Freq: Once | INTRAMUSCULAR | Status: AC
  Administered 2014-12-31: 300 ug via SUBCUTANEOUS
  Filled 2014-12-31: qty 0.6

## 2014-12-31 MED ORDER — SODIUM CHLORIDE 0.9 % IJ SOLN
10.0000 mL | INTRAMUSCULAR | Status: DC | PRN
  Administered 2014-12-31: 10 mL via INTRAVENOUS
  Filled 2014-12-31: qty 10

## 2014-12-31 MED ORDER — HEPARIN SOD (PORK) LOCK FLUSH 100 UNIT/ML IV SOLN
500.0000 [IU] | Freq: Once | INTRAVENOUS | Status: AC
  Administered 2014-12-31: 500 [IU] via INTRAVENOUS
  Filled 2014-12-31: qty 5

## 2014-12-31 NOTE — Progress Notes (Signed)
Radiation Oncology         [phone] ________________________________  Name: [name] MRN: [id number]  Date: 12/31/2014  DOB: 16-Apr-1934  Follow-Up Visit Note  CC: [name], MD  Heath Lark, MD  Diagnosis:   Duodenal mass suspicious for carcinoma  Narrative:  The patient returns today for routine follow-up. Low diastolic bp noted at 46. Patient reports her pacemaker is "dying." They plan to replace this in September or sooner. She did not get a transfusion today. She typically receives transfusions at least every week or two. She is still experiencing quite a bit of fatigue.  She saw her electro-cardiologist Dr. Lovena Le last week. She is considering not having her pacemaker replaced on August 5th. She is unsure if she wants to begin radiation treatment. She is curious if she would be able to try some radiation before her pacemaker replacement August 5th to allow her to truly decide on whether she wants to replace her pacemaker. She was curious about ablation as was discussed at her initial consult.                                ALLERGIES:  is allergic to aspirin; sulfonamide derivatives; ibuprofen; iron dextran; sulfa antibiotics; tape; and metoclopramide hcl.  Meds: Current Outpatient Prescriptions  Medication Sig Dispense Refill  . acetaminophen (TYLENOL) 325 MG tablet Take 650 mg by mouth every 6 (six) hours as needed for mild pain.     [name] albuterol (PROVENTIL HFA;VENTOLIN HFA) 108 (90 BASE) MCG/ACT inhaler Inhale 2 puffs into the lungs 2 (two) times daily.    [name] albuterol (PROVENTIL) (2.5 MG/3ML) 0.083% nebulizer solution Take 2.5 mg by nebulization every 4 (four) hours as needed for wheezing or shortness of breath.    . ALPRAZolam (XANAX) 0.25 MG tablet Take 0.25 mg by mouth at bedtime.     . baclofen (LIORESAL) 10 MG tablet Take 10 mg by mouth every 8 (eight) hours as needed for muscle spasms.     . beclomethasone (QVAR) 80 MCG/ACT inhaler Inhale 3 puffs into the  lungs 2 (two) times daily.     . budesonide (RHINOCORT AQUA) 32 MCG/ACT nasal spray Place 1 spray into both nostrils 2 (two) times daily.     . Calcium Carbonate-Vitamin D 600-400 MG-UNIT per tablet Take 1 tablet by mouth daily.    . Carboxymethylcellulose Sodium (LUBRICANT EYE DROPS OP) Apply 2 drops to eye 2 (two) times daily.     [name] DEXILANT 60 MG capsule Take 60 mg by mouth daily before breakfast.  3  . diphenhydrAMINE (BENADRYL) 25 mg capsule Take 25 mg by mouth daily as needed for itching.     . DULoxetine (CYMBALTA) 30 MG capsule Take 30 mg by mouth at bedtime.  3  . EPINEPHrine (EPIPEN IJ) Inject 1 application as directed as directed. AS NEEDED for anaphylaxis    . Eszopiclone 3 MG TABS Take 3 mg by mouth daily as needed (sleep).   2  . furosemide (LASIX) 40 MG tablet Take 40 mg by mouth daily as needed for fluid or edema.     [name] guaiFENesin-dextromethorphan (ROBITUSSIN DM) 100-10 MG/5ML syrup Take 15 mLs by mouth every 4 (four) hours as needed for cough.    . hydrocortisone cream 1 % Apply 1 application topically daily as needed for itching.    [name] ketoconazole (NIZORAL) 2 % cream Apply 1 application topically daily as needed. Apply to affected  area (rash).  6  . levothyroxine (SYNTHROID, LEVOTHROID) 50 MCG tablet Take 50 mcg by mouth daily.  3  . losartan (COZAAR) 50 MG tablet Take 50 mg by mouth daily.    . metoprolol succinate (TOPROL-XL) 50 MG 24 hr tablet Take 50 mg by mouth daily.  1  . mirtazapine (REMERON) 15 MG tablet Take 15 mg by mouth at bedtime.   3  . Multiple Vitamins-Minerals (MULTIVITAMIN & MINERAL PO) Take 1 tablet by mouth daily.    . polyethylene glycol (MIRALAX / GLYCOLAX) packet Take 17 g by mouth daily as needed for mild constipation or moderate constipation.    . potassium chloride (MICRO-K) 10 MEQ CR capsule     . pravastatin (PRAVACHOL) 40 MG tablet Take 40 mg by mouth at bedtime.   2  . predniSONE (DELTASONE) 2.5 MG tablet Take 5 tablets by mouth daily before  breakfast.    . ranitidine (ZANTAC) 300 MG tablet Take 300 mg by mouth at bedtime.  3  . senna-docusate (SENOKOT-S) 8.6-50 MG per tablet Take 1 tablet by mouth at bedtime.     . Vitamin D, Ergocalciferol, (DRISDOL) 50000 UNITS CAPS capsule Take 50,000 Units by mouth once a week. Every Sunday  3  . XOLAIR 150 MG injection Inject 150 mg into the skin every 28 (twenty-eight) days.     . zafirlukast (ACCOLATE) 20 MG tablet Take 20 mg by mouth 2 (two) times daily.  3  . oxycodone (OXY-IR) 5 MG capsule Take 5 mg by mouth every 4 (four) hours as needed for pain.     No current facility-administered medications for this encounter.    Physical Findings: The patient is in no acute distress. Patient is alert and oriented.  height is 5' 4.5" (1.638 m) and weight is 175 lb 8 oz (79.606 kg). Her blood pressure is 121/46 and her pulse is 65. Her respiration is 16 and oxygen saturation is 100%. .     Lab Findings: Lab Results  Component Value Date   WBC 4.4 12/31/2014   HGB 8.8* 12/31/2014   HCT 27.3* 12/31/2014   MCV 94.5 12/31/2014   PLT 99* 12/31/2014     Radiographic Findings: No results found.  Impression:    On repeat endoscopy, the patient's lesion within the duodenum was felt to remain highly suspicious for carcinoma. However, pathology did not reveal clear malignancy. Overall clinically, the patient still has a high level of suspicion for carcinoma. I would not advocate repeat biopsy once again. I discussed with the patient once again the possibility of radiation treatment to potentially help with bleeding. We discussed this in the context of an perfect information regarding her case as well as having a limited number of options given her comorbidities. The patient had a number of very good questions today and she was accompanied with her daughter.  Plan:  Discussed palliative radiation to help with her bleeding issues and increase her quality of life. Once again, this presents the presence  of cancer. This is also in the context of ongoing bleeding. A marker was placed at the time of endoscopy which would be extremely helpful in terms of target localization if the patient decides to proceed with radiation treatment.  She is currently considering not having her pacemaker replaced on August 5th which she has been told is necessary. The patient is trying to decide how aggressive she would like to be in terms of medical management.. She will call and ask if they can move  the replacement surgery back. She is going to think about her options and decide whether to proceed with simulation on 01/02/2015 in 2 days. The patient is also considering no further aggressive treatment.   This document serves as a record of services personally performed by Kyung Rudd, MD. It was created on his behalf by Arlyce Harman, a trained medical scribe. The creation of this record is based on the scribe's personal observations and the provider's statements to them. This document has been checked and approved by the attending provider.  Jodelle Gross, M.D., Ph.D.

## 2014-12-31 NOTE — Progress Notes (Signed)
See progress note under physician encounter. 

## 2014-12-31 NOTE — Progress Notes (Signed)
Low diastolic bp noted at 46. Patient reports her pacemaker is "dying." They plan to replace this in September or sooner.

## 2014-12-31 NOTE — Patient Instructions (Signed)

## 2014-12-31 NOTE — Progress Notes (Signed)
Per Dr. Alvy Bimler- Pt will not get blood products today. She will receive the Aranesp injection with Laverle Hobby, LPN.

## 2014-12-31 NOTE — Patient Instructions (Signed)
Darbepoetin Alfa injection What is this medicine? DARBEPOETIN ALFA (dar be POE e tin AL fa) helps your body make more red blood cells. It is used to treat anemia caused by chronic kidney failure and chemotherapy. This medicine may be used for other purposes; ask your health care provider or pharmacist if you have questions. COMMON BRAND NAME(S): Aranesp What should I tell my health care provider before I take this medicine? They need to know if you have any of these conditions: -blood clotting disorders or history of blood clots -cancer patient not on chemotherapy -cystic fibrosis -heart disease, such as angina, heart failure, or a history of a heart attack -hemoglobin level of 12 g/dL or greater -high blood pressure -low levels of folate, iron, or vitamin B12 -seizures -an unusual or allergic reaction to darbepoetin, erythropoietin, albumin, hamster proteins, latex, other medicines, foods, dyes, or preservatives -pregnant or trying to get pregnant -breast-feeding How should I use this medicine? This medicine is for injection into a vein or under the skin. It is usually given by a health care professional in a hospital or clinic setting. If you get this medicine at home, you will be taught how to prepare and give this medicine. Do not shake the solution before you withdraw a dose. Use exactly as directed. Take your medicine at regular intervals. Do not take your medicine more often than directed. It is important that you put your used needles and syringes in a special sharps container. Do not put them in a trash can. If you do not have a sharps container, call your pharmacist or healthcare provider to get one. Talk to your pediatrician regarding the use of this medicine in children. While this medicine may be used in children as young as 1 year for selected conditions, precautions do apply. Overdosage: If you think you have taken too much of this medicine contact a poison control center or  emergency room at once. NOTE: This medicine is only for you. Do not share this medicine with others. What if I miss a dose? If you miss a dose, take it as soon as you can. If it is almost time for your next dose, take only that dose. Do not take double or extra doses. What may interact with this medicine? Do not take this medicine with any of the following medications: -epoetin alfa This list may not describe all possible interactions. Give your health care provider a list of all the medicines, herbs, non-prescription drugs, or dietary supplements you use. Also tell them if you smoke, drink alcohol, or use illegal drugs. Some items may interact with your medicine. What should I watch for while using this medicine? Visit your prescriber or health care professional for regular checks on your progress and for the needed blood tests and blood pressure measurements. It is especially important for the doctor to make sure your hemoglobin level is in the desired range, to limit the risk of potential side effects and to give you the best benefit. Keep all appointments for any recommended tests. Check your blood pressure as directed. Ask your doctor what your blood pressure should be and when you should contact him or her. As your body makes more red blood cells, you may need to take iron, folic acid, or vitamin B supplements. Ask your doctor or health care provider which products are right for you. If you have kidney disease continue dietary restrictions, even though this medication can make you feel better. Talk with your doctor or health   care professional about the foods you eat and the vitamins that you take. What side effects may I notice from receiving this medicine? Side effects that you should report to your doctor or health care professional as soon as possible: -allergic reactions like skin rash, itching or hives, swelling of the face, lips, or tongue -breathing problems -changes in vision -chest  pain -confusion, trouble speaking or understanding -feeling faint or lightheaded, falls -high blood pressure -muscle aches or pains -pain, swelling, warmth in the leg -rapid weight gain -severe headaches -sudden numbness or weakness of the face, arm or leg -trouble walking, dizziness, loss of balance or coordination -seizures (convulsions) -swelling of the ankles, feet, hands -unusually weak or tired Side effects that usually do not require medical attention (report to your doctor or health care professional if they continue or are bothersome): -diarrhea -fever, chills (flu-like symptoms) -headaches -nausea, vomiting -redness, stinging, or swelling at site where injected This list may not describe all possible side effects. Call your doctor for medical advice about side effects. You may report side effects to FDA at 1-800-FDA-1088. Where should I keep my medicine? Keep out of the reach of children. Store in a refrigerator between 2 and 8 degrees C (36 and 46 degrees F). Do not freeze. Do not shake. Throw away any unused portion if using a single-dose vial. Throw away any unused medicine after the expiration date. NOTE: This sheet is a summary. It may not cover all possible information. If you have questions about this medicine, talk to your doctor, pharmacist, or health care provider.  2015, Elsevier/Gold Standard. (2008-05-20 10:23:57)  

## 2015-01-01 ENCOUNTER — Telehealth: Payer: Self-pay | Admitting: Internal Medicine

## 2015-01-01 NOTE — Telephone Encounter (Signed)
Informed pt that she started pacing at 65 BPM on 12-01-14. Pt verbalized understanding.

## 2015-01-01 NOTE — Telephone Encounter (Signed)
New message      Pt want to know the exact date her pacemaker started pacing at 65 beats per minute

## 2015-01-02 ENCOUNTER — Encounter: Payer: Self-pay | Admitting: Radiation Oncology

## 2015-01-02 ENCOUNTER — Ambulatory Visit
Admission: RE | Admit: 2015-01-02 | Discharge: 2015-01-02 | Disposition: A | Discharge status: Home / Self Care | Payer: Medicare Other | Source: Ambulatory Visit | Attending: Radiation Oncology | Admitting: Radiation Oncology

## 2015-01-02 ENCOUNTER — Telehealth: Payer: Self-pay | Admitting: Internal Medicine

## 2015-01-02 DIAGNOSIS — C170 Malignant neoplasm of duodenum: Secondary | ICD-10-CM | POA: Insufficient documentation

## 2015-01-02 DIAGNOSIS — C50919 Malignant neoplasm of unspecified site of unspecified female breast: Secondary | ICD-10-CM | POA: Diagnosis not present

## 2015-01-02 NOTE — Telephone Encounter (Signed)
She is really thinking of not having the PPM replaced.  Had an appointment today to discuss radiation and will call me back on Mon to let me if she wishes to proceed.  I let her daughter know what Dr Lovena Le has suggested about the change out and he will proceed if she wishes

## 2015-01-02 NOTE — Progress Notes (Signed)
  Radiation Oncology         [phone]) [phone] ________________________________  Name: [name] MRN: [id number]  Date: 01/02/2015  DOB: 06-13-1934  SIMULATION AND TREATMENT PLANNING NOTE  DIAGNOSIS:  Duodenal mass suspicious for carcinoma  Site:  Abdomen  NARRATIVE:  The patient was brought to the [facility].  Identity was confirmed.  All relevant records and images related to the planned course of therapy were reviewed.   Written consent to proceed with treatment was confirmed which was freely given after reviewing the details related to the planned course of therapy had been reviewed with the patient.  Then, the patient was set-up in a stable reproducible  supine position for radiation therapy.  CT images were obtained.  Surface markings were placed.    Medically necessary complex treatment device(s) for immobilization:  Customized vac lock bag.   The CT images were loaded into the planning software.  Then the target and avoidance structures were contoured.  Treatment planning then occurred.  The radiation prescription was entered and confirmed.  A total of 3 complex treatment devices were fabricated which relate to the designed radiation treatment fields. Each of these customized fields/ complex treatment devices will be used on a daily basis during the radiation course. I have requested : 3D Simulation  I have requested a DVH of the following structures: Target volume, kidneys bilaterally, spinal cord, liver.   The patient will undergo daily image guidance to ensure accurate localization of the target, and adequate minimize dose to the normal surrounding structures in close proximity to the target.  PLAN:  The patient will receive 30 Gy in 10 fractions.  This document serves as a record of services personally performed by Miranda Rudd, MD. It was created on his behalf by Arlyce Harman, a trained medical scribe. The creation of this record is based on the scribe's personal  observations and the provider's statements to them. This document has been checked and approved by the attending provider. ________________________________   Miranda Gross, MD, PhD

## 2015-01-02 NOTE — Telephone Encounter (Signed)
New message      Pt was seen 12-23-14.  Daughter has questions to ask the doctor regarding pt having a Nurse, children's.  Please call at your convenience

## 2015-01-03 ENCOUNTER — Encounter: Payer: Self-pay | Admitting: Radiation Oncology

## 2015-01-03 DIAGNOSIS — C50919 Malignant neoplasm of unspecified site of unspecified female breast: Secondary | ICD-10-CM | POA: Diagnosis not present

## 2015-01-05 ENCOUNTER — Telehealth: Payer: Self-pay | Admitting: Hematology and Oncology

## 2015-01-05 ENCOUNTER — Ambulatory Visit (HOSPITAL_BASED_OUTPATIENT_CLINIC_OR_DEPARTMENT_OTHER): Payer: Medicare Other

## 2015-01-05 ENCOUNTER — Encounter: Payer: Self-pay | Admitting: Hematology and Oncology

## 2015-01-05 ENCOUNTER — Telehealth: Payer: Self-pay

## 2015-01-05 ENCOUNTER — Other Ambulatory Visit: Payer: Self-pay | Admitting: Hematology and Oncology

## 2015-01-05 ENCOUNTER — Telehealth: Payer: Self-pay | Admitting: Internal Medicine

## 2015-01-05 ENCOUNTER — Ambulatory Visit (HOSPITAL_BASED_OUTPATIENT_CLINIC_OR_DEPARTMENT_OTHER): Payer: Medicare Other | Admitting: Hematology and Oncology

## 2015-01-05 VITALS — BP 106/44 | HR 65 | Temp 98.3°F | Resp 18 | Ht 64.5 in | Wt 175.7 lb

## 2015-01-05 DIAGNOSIS — C50919 Malignant neoplasm of unspecified site of unspecified female breast: Secondary | ICD-10-CM | POA: Diagnosis not present

## 2015-01-05 DIAGNOSIS — Z95828 Presence of other vascular implants and grafts: Secondary | ICD-10-CM

## 2015-01-05 DIAGNOSIS — Z853 Personal history of malignant neoplasm of breast: Secondary | ICD-10-CM | POA: Diagnosis not present

## 2015-01-05 DIAGNOSIS — D638 Anemia in other chronic diseases classified elsewhere: Secondary | ICD-10-CM | POA: Diagnosis not present

## 2015-01-05 DIAGNOSIS — D509 Iron deficiency anemia, unspecified: Secondary | ICD-10-CM | POA: Diagnosis not present

## 2015-01-05 DIAGNOSIS — K319 Disease of stomach and duodenum, unspecified: Secondary | ICD-10-CM | POA: Diagnosis present

## 2015-01-05 DIAGNOSIS — C170 Malignant neoplasm of duodenum: Secondary | ICD-10-CM

## 2015-01-05 DIAGNOSIS — Z45010 Encounter for checking and testing of cardiac pacemaker pulse generator [battery]: Secondary | ICD-10-CM

## 2015-01-05 DIAGNOSIS — K3189 Other diseases of stomach and duodenum: Secondary | ICD-10-CM

## 2015-01-05 LAB — COMPREHENSIVE METABOLIC PANEL (CC13)
ALT: 27 U/L (ref 0–55)
ANION GAP: 8 meq/L (ref 3–11)
AST: 21 U/L (ref 5–34)
Albumin: 3.5 g/dL (ref 3.5–5.0)
Alkaline Phosphatase: 47 U/L (ref 40–150)
BUN: 28.7 mg/dL — ABNORMAL HIGH (ref 7.0–26.0)
CO2: 24 mEq/L (ref 22–29)
CREATININE: 1 mg/dL (ref 0.6–1.1)
Calcium: 9.7 mg/dL (ref 8.4–10.4)
Chloride: 106 mEq/L (ref 98–109)
EGFR: 53 mL/min/{1.73_m2} — ABNORMAL LOW (ref >90)
GLUCOSE: 107 mg/dL (ref 70–140)
POTASSIUM: 4.5 meq/L (ref 3.5–5.1)
Sodium: 138 mEq/L (ref 136–145)
Total Bilirubin: 0.8 mg/dL (ref 0.20–1.20)
Total Protein: 5.6 g/dL — ABNORMAL LOW (ref 6.4–8.3)

## 2015-01-05 LAB — CBC & DIFF AND RETIC
BASO%: 0.2 % (ref 0.0–2.0)
Basophils Absolute: 0 10*3/uL (ref 0.0–0.1)
EOS ABS: 0.1 10*3/uL (ref 0.0–0.5)
EOS%: 1.7 % (ref 0.0–7.0)
HEMATOCRIT: 25.2 % — AB (ref 34.8–46.6)
HGB: 8.2 g/dL — ABNORMAL LOW (ref 11.6–15.9)
Immature Retic Fract: 11.6 % — ABNORMAL HIGH (ref 1.60–10.00)
LYMPH#: 1 10*3/uL (ref 0.9–3.3)
LYMPH%: 24.8 % (ref 14.0–49.7)
MCH: 30.7 pg (ref 25.1–34.0)
MCHC: 32.5 g/dL (ref 31.5–36.0)
MCV: 94.4 fL (ref 79.5–101.0)
MONO#: 0.4 10*3/uL (ref 0.1–0.9)
MONO%: 10.4 % (ref 0.0–14.0)
NEUT#: 2.6 10*3/uL (ref 1.5–6.5)
NEUT%: 62.9 % (ref 38.4–76.8)
PLATELETS: 142 10*3/uL — AB (ref 145–400)
RBC: 2.67 10*6/uL — AB (ref 3.70–5.45)
RDW: 16.6 % — AB (ref 11.2–14.5)
RETIC %: 2.59 % — AB (ref 0.70–2.10)
RETIC CT ABS: 69.15 10*3/uL (ref 33.70–90.70)
WBC: 4.2 10*3/uL (ref 3.9–10.3)

## 2015-01-05 LAB — TECHNOLOGIST REVIEW

## 2015-01-05 LAB — HOLD TUBE, BLOOD BANK

## 2015-01-05 MED ORDER — SODIUM CHLORIDE 0.9 % IJ SOLN
10.0000 mL | INTRAMUSCULAR | Status: DC | PRN
  Administered 2015-01-05: 10 mL via INTRAVENOUS
  Filled 2015-01-05: qty 10

## 2015-01-05 MED ORDER — PROMETHAZINE HCL 25 MG PO TABS
25.0000 mg | ORAL_TABLET | Freq: Four times a day (QID) | ORAL | Status: DC | PRN
Start: 2015-01-05 — End: 2015-02-18

## 2015-01-05 MED ORDER — HEPARIN SOD (PORK) LOCK FLUSH 100 UNIT/ML IV SOLN
500.0000 [IU] | Freq: Once | INTRAVENOUS | Status: AC
  Administered 2015-01-05: 500 [IU] via INTRAVENOUS
  Filled 2015-01-05: qty 5

## 2015-01-05 NOTE — Assessment & Plan Note (Signed)
We have extensive discussion about the risks and benefits of changing the pacemaker battery. The patient has no further wishes in life and just want to be comfortable. Ultimately, she would not want to have her pacemaker battery exchanged. She is ready for palliative care and hospice  We discussed prognosis and what would happen if her pacemaker failed.

## 2015-01-05 NOTE — Telephone Encounter (Signed)
Will route this message to Dr Lovena Le and Claiborne Billings RN that pt wishes not to proceed with getting her PPM changed.  Looks like Dr Lovena Le and nurse are aware that pt would be calling today with her decision to proceed with this or not.

## 2015-01-05 NOTE — Telephone Encounter (Signed)
Gave and printed appt sched and avs for pt for July and Aug °

## 2015-01-05 NOTE — Progress Notes (Signed)
I reviewed test results with the patient. We will proceed with 2 units of blood crossmatched to be transfused on Wednesday, 01/07/2015. I will cancel her appointment for 01/14/2015 as we will be giving HER 2 units of blood this week.

## 2015-01-05 NOTE — Telephone Encounter (Signed)
New message      FYI Pt has decided not to have battery changeout procedure on 01-23-15.  Wanted the doctor to know

## 2015-01-05 NOTE — Assessment & Plan Note (Signed)
We have a long discussion about this Repeat EGD was highly suspicious for malignancy but biopsy was nonmalignant We discussed limitation of biopsy. We discussed the risk and benefit of radiation therapy. Ultimately, the patient does not want to pursue radiation therapy and just one blood transfusion as needed to stay alive.  SHE IS SCHEDULED TO RETURN ON A WEEKLY BASIS FOR BLOOD DRAW AND BLOOD TRANSFUSION AS NEEDED

## 2015-01-05 NOTE — Progress Notes (Signed)
[facility] OFFICE PROGRESS NOTE  Patient Care Team: Prince Solian, MD as PCP - General (Internal Medicine) Gatha Mayer, MD as Consulting Physician (Gastroenterology) Evans Lance, MD as Consulting Physician (Cardiology) Collene Gobble, MD as Consulting Physician (Pulmonary Disease) Jiles Prows, MD as Attending Physician ([place]) Heath Lark, MD as Consulting Physician (Hematology and Oncology) Prince Solian, MD  SUMMARY OF ONCOLOGIC HISTORY:  This patient have remote history of breast cancer diagnosed from screening mammogram in 2003. She had surgery, radiation therapy and adjuvant tamoxifen. 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 She had received B12 injection for B12 deficiency and intravenous iron for iron deficiency anemia. The patient had history of allergic reaction to intravenous iron dextran. She received intravenous iron feraheme in November 6226 without complications. In May 2016, she was found to have melanoma and significant GI bleed. On 11/24/2014, EGD and small bowel endoscopy revealed gastritis, AVM and duodenal mass. Biopsy showed nonmalignant tubular adenoma.Accession: [id number] CT scan of the abdomen dated 11/13/2014 show no retroperitoneal or other acute hemorrhage. Descending colostomy with peristomal hernia containing nonobstructed bowel. Possible cirrhosis She have recurrent admission to the hospital for severe anemia. Her case was recently presented at GI tumor board   repeat EGD on 12/25/2014 again showed malignant mass but biopsy was benign INTERVAL HISTORY: Please see below for problem oriented charting. She was informed her pacemaker battery needs to be replaced. The patient denies any recent signs or symptoms of bleeding such as spontaneous epistaxis, hematuria or hematochezia. She feels weak, dyspneic with recurrent non-productive cough and  wheezing  REVIEW OF SYSTEMS:   Constitutional: Denies fevers, chills or abnormal weight loss Eyes: Denies blurriness of vision Ears, nose, mouth, throat, and face: Denies mucositis or sore throat Cardiovascular: Denies palpitation, chest discomfort or lower extremity swelling Gastrointestinal:  Denies nausea, heartburn or change in bowel habits Skin: Denies abnormal skin rashes Lymphatics: Denies new lymphadenopathy or easy bruising Neurological:Denies numbness, tingling or new weaknesses Behavioral/Psych: Mood is stable, no new changes  All other systems were reviewed with the patient and are negative.  I have reviewed the past medical history, past surgical history, social history and family history with the patient and they are unchanged from previous note.  ALLERGIES:  is allergic to aspirin; sulfonamide derivatives; ibuprofen; iron dextran; sulfa antibiotics; tape; and metoclopramide hcl.  MEDICATIONS:  Current Outpatient Prescriptions  Medication Sig Dispense Refill  . acetaminophen (TYLENOL) 325 MG tablet Take 650 mg by mouth every 6 (six) hours as needed for mild pain.     [name] albuterol (PROVENTIL HFA;VENTOLIN HFA) 108 (90 BASE) MCG/ACT inhaler Inhale 2 puffs into the lungs 2 (two) times daily.    [name] albuterol (PROVENTIL) (2.5 MG/3ML) 0.083% nebulizer solution Take 2.5 mg by nebulization every 4 (four) hours as needed for wheezing or shortness of breath.    . ALPRAZolam (XANAX) 0.25 MG tablet Take 0.25 mg by mouth at bedtime.     . beclomethasone (QVAR) 80 MCG/ACT inhaler Inhale 3 puffs into the lungs 2 (two) times daily.     . budesonide (RHINOCORT AQUA) 32 MCG/ACT nasal spray Place 1 spray into both nostrils 2 (two) times daily.     . Calcium Carbonate-Vitamin D 600-400 MG-UNIT per tablet Take 1 tablet by mouth daily.    . Carboxymethylcellulose Sodium (LUBRICANT EYE DROPS OP) Apply 2 drops to eye 2 (two) times daily.     [name] DEXILANT 60 MG  capsule Take 60 mg by mouth daily before  breakfast.  3  . DULoxetine (CYMBALTA) 30 MG capsule Take 30 mg by mouth at bedtime.  3  . furosemide (LASIX) 40 MG tablet Take 40 mg by mouth daily as needed for fluid or edema.     [name] levothyroxine (SYNTHROID, LEVOTHROID) 50 MCG tablet Take 50 mcg by mouth daily.  3  . losartan (COZAAR) 50 MG tablet Take 50 mg by mouth daily.    . metoprolol succinate (TOPROL-XL) 50 MG 24 hr tablet Take 50 mg by mouth daily.  1  . mirtazapine (REMERON) 15 MG tablet Take 15 mg by mouth at bedtime.   3  . Multiple Vitamins-Minerals (MULTIVITAMIN & MINERAL PO) Take 1 tablet by mouth daily.    . polyethylene glycol (MIRALAX / GLYCOLAX) packet Take 17 g by mouth daily as needed for mild constipation or moderate constipation.    . potassium chloride (MICRO-K) 10 MEQ CR capsule     . pravastatin (PRAVACHOL) 40 MG tablet Take 40 mg by mouth at bedtime.   2  . predniSONE (DELTASONE) 2.5 MG tablet Take 5 tablets by mouth daily before breakfast.    . ranitidine (ZANTAC) 300 MG tablet Take 300 mg by mouth at bedtime.  3  . senna-docusate (SENOKOT-S) 8.6-50 MG per tablet Take 1 tablet by mouth at bedtime.     . Vitamin D, Ergocalciferol, (DRISDOL) 50000 UNITS CAPS capsule Take 50,000 Units by mouth once a week. Every Sunday  3  . XOLAIR 150 MG injection Inject 150 mg into the skin every 28 (twenty-eight) days.     . zafirlukast (ACCOLATE) 20 MG tablet Take 20 mg by mouth 2 (two) times daily.  3  . baclofen (LIORESAL) 10 MG tablet Take 10 mg by mouth every 8 (eight) hours as needed for muscle spasms.     . diphenhydrAMINE (BENADRYL) 25 mg capsule Take 25 mg by mouth daily as needed for itching.     [name] EPINEPHrine (EPIPEN IJ) Inject 1 application as directed as directed. AS NEEDED for anaphylaxis    . Eszopiclone 3 MG TABS Take 3 mg by mouth daily as needed (sleep).   2  . guaiFENesin-dextromethorphan (ROBITUSSIN DM) 100-10 MG/5ML syrup Take 15 mLs by mouth every 4 (four) hours as needed for cough.    . hydrocortisone cream 1  % Apply 1 application topically daily as needed for itching.    [name] ketoconazole (NIZORAL) 2 % cream Apply 1 application topically daily as needed. Apply to affected area (rash).  6  . oxycodone (OXY-IR) 5 MG capsule Take 5 mg by mouth every 4 (four) hours as needed for pain.    . promethazine (PHENERGAN) 25 MG tablet Take 1 tablet (25 mg total) by mouth every 6 (six) hours as needed for nausea or vomiting. 60 tablet 0   No current facility-administered medications for this visit.    PHYSICAL EXAMINATION: ECOG PERFORMANCE STATUS: 2 - Symptomatic, <50% confined to bed  Filed Vitals:   01/05/15 1152  BP: 106/44  Pulse: 65  Temp: 98.3 F (36.8 C)  Resp: 18   Filed Weights   01/05/15 1152  Weight: 175 lb 11.2 oz (79.697 kg)    GENERAL:alert, no distress and comfortable SKIN: skin color, texture, turgor are normal, no rashes or significant lesions. She has extensive skin bruises EYES: normal, Conjunctiva are pale and non-injected, sclera clear Musculoskeletal:no cyanosis of digits and no clubbing  NEURO: alert & oriented x 3 with fluent speech,  no focal motor/sensory deficits  LABORATORY DATA:  I have reviewed the data as listed    Component Value Date/Time   NA 139 12/24/2014 0940   NA 136 11/26/2014 0550   K 4.4 12/24/2014 0940   K 3.7 11/26/2014 0550   CL 102 11/26/2014 0550   CO2 24 12/24/2014 0940   CO2 26 11/26/2014 0550   GLUCOSE 98 12/24/2014 0940   GLUCOSE 96 11/26/2014 0550   BUN 28.6* 12/24/2014 0940   BUN 21* 11/26/2014 0550   CREATININE 1.1 12/24/2014 0940   CREATININE 1.12* 11/26/2014 0550   CALCIUM 9.9 12/24/2014 0940   CALCIUM 9.1 11/26/2014 0550   PROT 5.2* 12/24/2014 0940   PROT 4.8* 11/22/2014 0513   ALBUMIN 3.3* 12/24/2014 0940   ALBUMIN 3.0* 11/22/2014 0513   AST 23 12/24/2014 0940   AST 21 11/22/2014 0513   ALT 29 12/24/2014 0940   ALT 19 11/22/2014 0513   ALKPHOS 42 12/24/2014 0940   ALKPHOS 36* 11/22/2014 0513   BILITOT 0.55 12/24/2014 0940    BILITOT 1.6* 11/22/2014 0513   GFRNONAA 46* 11/26/2014 0550   GFRAA 53* 11/26/2014 0550    No results found for: SPEP, UPEP  Lab Results  Component Value Date   WBC 4.4 12/31/2014   NEUTROABS 2.9 12/31/2014   HGB 8.8* 12/31/2014   HCT 27.3* 12/31/2014   MCV 94.5 12/31/2014   PLT 99* 12/31/2014      Chemistry      Component Value Date/Time   NA 139 12/24/2014 0940   NA 136 11/26/2014 0550   K 4.4 12/24/2014 0940   K 3.7 11/26/2014 0550   CL 102 11/26/2014 0550   CO2 24 12/24/2014 0940   CO2 26 11/26/2014 0550   BUN 28.6* 12/24/2014 0940   BUN 21* 11/26/2014 0550   CREATININE 1.1 12/24/2014 0940   CREATININE 1.12* 11/26/2014 0550      Component Value Date/Time   CALCIUM 9.9 12/24/2014 0940   CALCIUM 9.1 11/26/2014 0550   ALKPHOS 42 12/24/2014 0940   ALKPHOS 36* 11/22/2014 0513   AST 23 12/24/2014 0940   AST 21 11/22/2014 0513   ALT 29 12/24/2014 0940   ALT 19 11/22/2014 0513   BILITOT 0.55 12/24/2014 0940   BILITOT 1.6* 11/22/2014 0513      ASSESSMENT & PLAN:  Duodenal mass We have a long discussion about this Repeat EGD was highly suspicious for malignancy but biopsy was nonmalignant We discussed limitation of biopsy. We discussed the risk and benefit of radiation therapy. Ultimately, the patient does not want to pursue radiation therapy and just one blood transfusion as needed to stay alive.  SHE IS SCHEDULED TO RETURN ON A WEEKLY BASIS FOR BLOOD DRAW AND BLOOD TRANSFUSION AS NEEDED   Anemia in chronic illness The absence of significant reticulocytosis in the presence of recent GI bleed suggested component of anemia of chronic disease. Recent serum vitamin B-12 and iron studies were adequate. Serum erythropoietin level is low I discussed with her the risks and benefits of Aranesp injection I will start with inj every 2 weeks X 4 and reassess Goal is to keep hemoglobin > 10  if her hemoglobin is very low, the patient will get blood transfusion.  Iron  deficiency anemia She has confirmed melana and AVM in the past. Recent endoscopy revealed a duodenal mass. Unfortunately, biopsy was non diagnostic although malignancy is suspected. I will reserve to only give her IV iron if ferritin is less than 100 She will continue  blood count monitoring weekly and plan would be to give her 1 unit of blood if hemoglobin < 8    Pacemaker at end of battery life  We have extensive discussion about the risks and benefits of changing the pacemaker battery. The patient has no further wishes in life and just want to be comfortable. Ultimately, she would not want to have her pacemaker battery exchanged. She is ready for palliative care and hospice  We discussed prognosis and what would happen if her pacemaker failed.   No orders of the defined types were placed in this encounter.   All questions were answered. The patient knows to call the clinic with any problems, questions or concerns. No barriers to learning was detected. I spent 30 minutes counseling the patient face to face. The total time spent in the appointment was 40 minutes and more than 50% was on counseling and review of test results     [facility], [place], MD 01/05/2015 1:17 PM

## 2015-01-05 NOTE — Assessment & Plan Note (Signed)
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 Recent endoscopy revealed a duodenal mass. Unfortunately, biopsy was non diagnostic although malignancy is suspected. I will reserve to only give her IV iron if ferritin is less than 100 She will continue blood count monitoring weekly and plan would be to give her 1 unit of blood if hemoglobin < 8

## 2015-01-05 NOTE — Telephone Encounter (Signed)
Pacemaker form faxed to [facility].Marland Mcalpine will make sure Raquel Sarna receives and return as patient is scheduled to start treatment on Wednesday 01/07/15.

## 2015-01-05 NOTE — Assessment & Plan Note (Signed)
The absence of significant reticulocytosis in the presence of recent GI bleed suggested component of anemia of chronic disease. Recent serum vitamin B-12 and iron studies were adequate. Serum erythropoietin level is low I discussed with her the risks and benefits of Aranesp injection I will start with inj every 2 weeks X 4 and reassess Goal is to keep hemoglobin > 10  if her hemoglobin is very low, the patient will get blood transfusion.

## 2015-01-05 NOTE — Patient Instructions (Signed)

## 2015-01-06 NOTE — Telephone Encounter (Signed)
Spoke with both daughter and patient and let them know if they needed anything not to hesitate calling me.

## 2015-01-07 ENCOUNTER — Ambulatory Visit
Admission: RE | Admit: 2015-01-07 | Discharge: 2015-01-07 | Disposition: A | Discharge status: Home / Self Care | Payer: Medicare Other | Source: Ambulatory Visit | Attending: Radiation Oncology | Admitting: Radiation Oncology

## 2015-01-07 ENCOUNTER — Other Ambulatory Visit: Payer: Self-pay | Admitting: *Deleted

## 2015-01-07 ENCOUNTER — Ambulatory Visit (HOSPITAL_BASED_OUTPATIENT_CLINIC_OR_DEPARTMENT_OTHER): Payer: Medicare Other

## 2015-01-07 ENCOUNTER — Telehealth: Payer: Self-pay | Admitting: *Deleted

## 2015-01-07 ENCOUNTER — Other Ambulatory Visit: Payer: Self-pay

## 2015-01-07 ENCOUNTER — Ambulatory Visit: Payer: Medicare Other

## 2015-01-07 VITALS — BP 117/50 | HR 65 | Temp 99.7°F | Resp 18

## 2015-01-07 DIAGNOSIS — Z95828 Presence of other vascular implants and grafts: Secondary | ICD-10-CM

## 2015-01-07 DIAGNOSIS — D509 Iron deficiency anemia, unspecified: Secondary | ICD-10-CM | POA: Diagnosis not present

## 2015-01-07 DIAGNOSIS — D638 Anemia in other chronic diseases classified elsewhere: Secondary | ICD-10-CM | POA: Diagnosis present

## 2015-01-07 LAB — PREPARE RBC (CROSSMATCH)

## 2015-01-07 MED ORDER — LIDOCAINE-PRILOCAINE 2.5-2.5 % EX CREA: 1.0000 "application " | TOPICAL_CREAM | CUTANEOUS | Status: DC | PRN

## 2015-01-07 MED ORDER — HEPARIN SOD (PORK) LOCK FLUSH 100 UNIT/ML IV SOLN
500.0000 [IU] | Freq: Every day | INTRAVENOUS | Status: AC | PRN
  Administered 2015-01-07: 500 [IU]
  Filled 2015-01-07: qty 5

## 2015-01-07 MED ORDER — ACETAMINOPHEN 325 MG PO TABS
ORAL_TABLET | ORAL | Status: AC
  Filled 2015-01-07: qty 2

## 2015-01-07 MED ORDER — DIPHENHYDRAMINE HCL 25 MG PO CAPS
ORAL_CAPSULE | ORAL | Status: AC
  Filled 2015-01-07: qty 1

## 2015-01-07 MED ORDER — SODIUM CHLORIDE 0.9 % IJ SOLN
10.0000 mL | INTRAMUSCULAR | Status: AC | PRN
  Administered 2015-01-07: 10 mL
  Filled 2015-01-07: qty 10

## 2015-01-07 MED ORDER — SODIUM CHLORIDE 0.9 % IJ SOLN
10.0000 mL | INTRAMUSCULAR | Status: DC | PRN
  Administered 2015-01-07: 10 mL via INTRAVENOUS
  Filled 2015-01-07: qty 10

## 2015-01-07 MED ORDER — FUROSEMIDE 10 MG/ML IJ SOLN: 20.0000 mg | Freq: Once | INTRAMUSCULAR | Status: DC

## 2015-01-07 MED ORDER — DIPHENHYDRAMINE HCL 25 MG PO CAPS
25.0000 mg | ORAL_CAPSULE | Freq: Once | ORAL | Status: AC
  Administered 2015-01-07: 25 mg via ORAL

## 2015-01-07 MED ORDER — HEPARIN SOD (PORK) LOCK FLUSH 100 UNIT/ML IV SOLN
500.0000 [IU] | Freq: Once | INTRAVENOUS | Status: DC
  Filled 2015-01-07: qty 5

## 2015-01-07 MED ORDER — SODIUM CHLORIDE 0.9 % IV SOLN
250.0000 mL | Freq: Once | INTRAVENOUS | Status: AC
  Administered 2015-01-07: 250 mL via INTRAVENOUS

## 2015-01-07 MED ORDER — ACETAMINOPHEN 325 MG PO TABS
650.0000 mg | ORAL_TABLET | Freq: Once | ORAL | Status: AC
  Administered 2015-01-07: 650 mg via ORAL

## 2015-01-07 NOTE — Telephone Encounter (Signed)
Pls call in refill X 3

## 2015-01-07 NOTE — Progress Notes (Signed)
Patient states she does not normally get lasix in between units of blood. States she does not want it today and she has lasix at home that she can take tonight if she needs it. Cameo, Dr. Calton Dach RN notified.

## 2015-01-07 NOTE — Progress Notes (Signed)
Received a call from [facility] regarding pt not wanting to receive radiation treatment.  Contacted Dr. Filiberto Pinks nurse Cameo to seek clarification.  Cameo reports this patient had a conversation with Dr. Alvy Bimler saying they wanted to cancel getting treatment via radiation and to use palliative care management instead.  Dr. Alvy Bimler has notified Dr. Lisbeth Renshaw.

## 2015-01-07 NOTE — Patient Instructions (Signed)

## 2015-01-07 NOTE — Telephone Encounter (Signed)
Pt has requested Emla cream to be sent to her pharmacy for her port.

## 2015-01-07 NOTE — Patient Instructions (Signed)

## 2015-01-08 ENCOUNTER — Ambulatory Visit: Payer: Medicare Other

## 2015-01-08 ENCOUNTER — Ambulatory Visit
Admission: RE | Admit: 2015-01-08 | Discharge: 2015-01-08 | Disposition: A | Discharge status: Home / Self Care | Payer: Medicare Other | Source: Ambulatory Visit | Attending: Radiation Oncology | Admitting: Radiation Oncology

## 2015-01-08 LAB — TYPE AND SCREEN
ABO/RH(D): A POS
ANTIBODY SCREEN: NEGATIVE
Unit division: 0
Unit division: 0

## 2015-01-09 ENCOUNTER — Encounter: Payer: Self-pay | Admitting: Radiation Oncology

## 2015-01-09 ENCOUNTER — Ambulatory Visit: Payer: Medicare Other

## 2015-01-12 ENCOUNTER — Ambulatory Visit: Payer: Medicare Other

## 2015-01-13 ENCOUNTER — Ambulatory Visit (HOSPITAL_BASED_OUTPATIENT_CLINIC_OR_DEPARTMENT_OTHER): Payer: Medicare Other | Admitting: Hematology and Oncology

## 2015-01-13 ENCOUNTER — Telehealth: Payer: Self-pay | Admitting: *Deleted

## 2015-01-13 ENCOUNTER — Encounter: Payer: Self-pay | Admitting: Hematology and Oncology

## 2015-01-13 ENCOUNTER — Other Ambulatory Visit: Payer: Self-pay | Admitting: *Deleted

## 2015-01-13 ENCOUNTER — Ambulatory Visit: Payer: Medicare Other

## 2015-01-13 VITALS — BP 105/48 | HR 64 | Temp 99.7°F | Resp 18

## 2015-01-13 DIAGNOSIS — K319 Disease of stomach and duodenum, unspecified: Secondary | ICD-10-CM

## 2015-01-13 DIAGNOSIS — D518 Other vitamin B12 deficiency anemias: Secondary | ICD-10-CM

## 2015-01-13 DIAGNOSIS — D638 Anemia in other chronic diseases classified elsewhere: Secondary | ICD-10-CM | POA: Diagnosis not present

## 2015-01-13 DIAGNOSIS — D509 Iron deficiency anemia, unspecified: Secondary | ICD-10-CM

## 2015-01-13 DIAGNOSIS — R233 Spontaneous ecchymoses: Secondary | ICD-10-CM | POA: Diagnosis present

## 2015-01-13 DIAGNOSIS — L989 Disorder of the skin and subcutaneous tissue, unspecified: Secondary | ICD-10-CM | POA: Insufficient documentation

## 2015-01-13 DIAGNOSIS — R238 Other skin changes: Secondary | ICD-10-CM

## 2015-01-13 DIAGNOSIS — K3189 Other diseases of stomach and duodenum: Secondary | ICD-10-CM

## 2015-01-13 DIAGNOSIS — Z45010 Encounter for checking and testing of cardiac pacemaker pulse generator [battery]: Secondary | ICD-10-CM

## 2015-01-13 HISTORY — DX: Disorder of the skin and subcutaneous tissue, unspecified: L98.9

## 2015-01-13 MED ORDER — DARBEPOETIN ALFA 300 MCG/0.6ML IJ SOSY
300.0000 ug | PREFILLED_SYRINGE | Freq: Once | INTRAMUSCULAR | Status: AC
  Administered 2015-01-13: 300 ug via SUBCUTANEOUS
  Filled 2015-01-13: qty 0.6

## 2015-01-13 MED ORDER — CEPHALEXIN 500 MG PO CAPS: 500.0000 mg | ORAL_CAPSULE | Freq: Two times a day (BID) | ORAL | Status: DC

## 2015-01-13 NOTE — Telephone Encounter (Signed)
Tell patient to come here ASAP If after 430 pm, will get [place] to see

## 2015-01-13 NOTE — Assessment & Plan Note (Signed)
She had very thin skin and easy bruising. There is extensive bruises over the port site but clinically it does not appear infected. Her skin over the port has not healed and it could cause her to be at risk of infection. I will cover her with 7 days of Keflex and reassess next week. We will put extra precaution to minimize the use of tapes. Next week, we will check her blood with just a fingerstick because if her anemia responds to Aranesp, she may not need blood transfusion and we may not have to access her port.

## 2015-01-13 NOTE — Patient Instructions (Signed)
Darbepoetin Alfa injection What is this medicine? DARBEPOETIN ALFA (dar be POE e tin AL fa) helps your body make more red blood cells. It is used to treat anemia caused by chronic kidney failure and chemotherapy. This medicine may be used for other purposes; ask your health care provider or pharmacist if you have questions. COMMON BRAND NAME(S): Aranesp What should I tell my health care provider before I take this medicine? They need to know if you have any of these conditions: -blood clotting disorders or history of blood clots -cancer patient not on chemotherapy -cystic fibrosis -heart disease, such as angina, heart failure, or a history of a heart attack -hemoglobin level of 12 g/dL or greater -high blood pressure -low levels of folate, iron, or vitamin B12 -seizures -an unusual or allergic reaction to darbepoetin, erythropoietin, albumin, hamster proteins, latex, other medicines, foods, dyes, or preservatives -pregnant or trying to get pregnant -breast-feeding How should I use this medicine? This medicine is for injection into a vein or under the skin. It is usually given by a health care professional in a hospital or clinic setting. If you get this medicine at home, you will be taught how to prepare and give this medicine. Do not shake the solution before you withdraw a dose. Use exactly as directed. Take your medicine at regular intervals. Do not take your medicine more often than directed. It is important that you put your used needles and syringes in a special sharps container. Do not put them in a trash can. If you do not have a sharps container, call your pharmacist or healthcare provider to get one. Talk to your pediatrician regarding the use of this medicine in children. While this medicine may be used in children as young as 1 year for selected conditions, precautions do apply. Overdosage: If you think you have taken too much of this medicine contact a poison control center or  emergency room at once. NOTE: This medicine is only for you. Do not share this medicine with others. What if I miss a dose? If you miss a dose, take it as soon as you can. If it is almost time for your next dose, take only that dose. Do not take double or extra doses. What may interact with this medicine? Do not take this medicine with any of the following medications: -epoetin alfa This list may not describe all possible interactions. Give your health care provider a list of all the medicines, herbs, non-prescription drugs, or dietary supplements you use. Also tell them if you smoke, drink alcohol, or use illegal drugs. Some items may interact with your medicine. What should I watch for while using this medicine? Visit your prescriber or health care professional for regular checks on your progress and for the needed blood tests and blood pressure measurements. It is especially important for the doctor to make sure your hemoglobin level is in the desired range, to limit the risk of potential side effects and to give you the best benefit. Keep all appointments for any recommended tests. Check your blood pressure as directed. Ask your doctor what your blood pressure should be and when you should contact him or her. As your body makes more red blood cells, you may need to take iron, folic acid, or vitamin B supplements. Ask your doctor or health care provider which products are right for you. If you have kidney disease continue dietary restrictions, even though this medication can make you feel better. Talk with your doctor or health   care professional about the foods you eat and the vitamins that you take. What side effects may I notice from receiving this medicine? Side effects that you should report to your doctor or health care professional as soon as possible: -allergic reactions like skin rash, itching or hives, swelling of the face, lips, or tongue -breathing problems -changes in vision -chest  pain -confusion, trouble speaking or understanding -feeling faint or lightheaded, falls -high blood pressure -muscle aches or pains -pain, swelling, warmth in the leg -rapid weight gain -severe headaches -sudden numbness or weakness of the face, arm or leg -trouble walking, dizziness, loss of balance or coordination -seizures (convulsions) -swelling of the ankles, feet, hands -unusually weak or tired Side effects that usually do not require medical attention (report to your doctor or health care professional if they continue or are bothersome): -diarrhea -fever, chills (flu-like symptoms) -headaches -nausea, vomiting -redness, stinging, or swelling at site where injected This list may not describe all possible side effects. Call your doctor for medical advice about side effects. You may report side effects to FDA at 1-800-FDA-1088. Where should I keep my medicine? Keep out of the reach of children. Store in a refrigerator between 2 and 8 degrees C (36 and 46 degrees F). Do not freeze. Do not shake. Throw away any unused portion if using a single-dose vial. Throw away any unused medicine after the expiration date. NOTE: This sheet is a summary. It may not cover all possible information. If you have questions about this medicine, talk to your doctor, pharmacist, or health care provider.  2015, Elsevier/Gold Standard. (2008-05-20 10:23:57)  

## 2015-01-13 NOTE — Telephone Encounter (Signed)
err

## 2015-01-13 NOTE — Assessment & Plan Note (Signed)
The absence of significant reticulocytosis in the presence of recent GI bleed suggested component of anemia of chronic disease. Recent serum vitamin B-12 and iron studies were adequate. Serum erythropoietin level is low I discussed with [name] the risks and benefits of Aranesp injection I will start with inj every 2 weeks X 4 and reassess Goal is to keep hemoglobin > 10  if [name] hemoglobin is very low, the patient will get blood transfusion. She is due for Aranesp tomorrow. To reduce [name] transportation back and forth, we will proceed with Aranesp today.

## 2015-01-13 NOTE — Progress Notes (Signed)
[place] OFFICE PROGRESS NOTE  Patient Care Team: Prince Solian, MD as PCP - General (Internal Medicine) Gatha Mayer, MD as Consulting Physician (Gastroenterology) Evans Lance, MD as Consulting Physician (Cardiology) Collene Gobble, MD as Consulting Physician (Pulmonary Disease) Jiles Prows, MD as Attending Physician ([place]) Heath Lark, MD as Consulting Physician (Hematology and Oncology) Prince Solian, MD  SUMMARY OF ONCOLOGIC HISTORY:  This patient have remote history of breast cancer diagnosed from screening mammogram in 2003. She had surgery, radiation therapy and adjuvant tamoxifen. 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 She had received B12 injection for B12 deficiency and intravenous iron for iron deficiency anemia. The patient had history of allergic reaction to intravenous iron dextran. She received intravenous iron feraheme in November 6301 without complications. In May 2016, she was found to have melanoma and significant GI bleed. On 11/24/2014, EGD and small bowel endoscopy revealed gastritis, AVM and duodenal mass. Biopsy showed nonmalignant tubular adenoma.Accession: [id number] CT scan of the abdomen dated 11/13/2014 show no retroperitoneal or other acute hemorrhage. Descending colostomy with peristomal hernia containing nonobstructed bowel. Possible cirrhosis She have recurrent admission to the hospital for severe anemia. Her case was recently presented at GI tumor board   repeat EGD on 12/25/2014 again showed malignant mass but biopsy was benign INTERVAL HISTORY: Please see below for problem oriented charting. She is seen urgently today because of abnormal appearance over her port area, concerned for infection. Her port was accessed multiple times in the past few weeks without the use of lidocaine cream and that had caused significant pain and discomfort. The  patient is on chronic prednisone therapy with significant easy bruising and thin skin. She denies fevers or chills. She also shared with me from her recent encounter with palliative care and was told she cannot get enrolled in hospice program if she continues to receive Aranesp or  blood transfusion. She is feel fine. She denies further cough since the last time I saw her. The patient denies any recent signs or symptoms of bleeding such as spontaneous epistaxis, hematuria or hematochezia.  REVIEW OF SYSTEMS:   Constitutional: Denies fevers, chills or abnormal weight loss Eyes: Denies blurriness of vision Ears, nose, mouth, throat, and face: Denies mucositis or sore throat Respiratory: Denies cough, dyspnea or wheezes Cardiovascular: Denies palpitation, chest discomfort or lower extremity swelling Gastrointestinal:  Denies nausea, heartburn or change in bowel habits Lymphatics: Denies new lymphadenopathy  Neurological:Denies numbness, tingling or new weaknesses Behavioral/Psych: Mood is stable, no new changes  All other systems were reviewed with the patient and are negative.  I have reviewed the past medical history, past surgical history, social history and family history with the patient and they are unchanged from previous note.  ALLERGIES:  is allergic to aspirin; sulfonamide derivatives; ibuprofen; iron dextran; sulfa antibiotics; tape; and metoclopramide hcl.  MEDICATIONS:  Current Outpatient Prescriptions  Medication Sig Dispense Refill  . acetaminophen (TYLENOL) 325 MG tablet Take 650 mg by mouth every 6 (six) hours as needed for mild pain.     [name] albuterol (PROVENTIL HFA;VENTOLIN HFA) 108 (90 BASE) MCG/ACT inhaler Inhale 2 puffs into the lungs 2 (two) times daily.    [name] albuterol (PROVENTIL) (2.5 MG/3ML) 0.083% nebulizer solution Take 2.5 mg by nebulization every 4 (four) hours as needed for wheezing or shortness of breath.    . ALPRAZolam (XANAX) 0.25 MG tablet Take 0.25 mg by  mouth at bedtime.     [name]  baclofen (LIORESAL) 10 MG tablet Take 10 mg by mouth every 8 (eight) hours as needed for muscle spasms.     . beclomethasone (QVAR) 80 MCG/ACT inhaler Inhale 3 puffs into the lungs 2 (two) times daily.     . budesonide (RHINOCORT AQUA) 32 MCG/ACT nasal spray Place 1 spray into both nostrils 2 (two) times daily.     . Calcium Carbonate-Vitamin D 600-400 MG-UNIT per tablet Take 1 tablet by mouth daily.    . Carboxymethylcellulose Sodium (LUBRICANT EYE DROPS OP) Apply 2 drops to eye 2 (two) times daily.     . cephALEXin (KEFLEX) 500 MG capsule Take 1 capsule (500 mg total) by mouth 2 (two) times daily. 14 capsule 0  . DEXILANT 60 MG capsule Take 60 mg by mouth daily before breakfast.  3  . diphenhydrAMINE (BENADRYL) 25 mg capsule Take 25 mg by mouth daily as needed for itching.     . DULoxetine (CYMBALTA) 30 MG capsule Take 30 mg by mouth at bedtime.  3  . EPINEPHrine (EPIPEN IJ) Inject 1 application as directed as directed. AS NEEDED for anaphylaxis    . Eszopiclone 3 MG TABS Take 3 mg by mouth daily as needed (sleep).   2  . furosemide (LASIX) 40 MG tablet Take 40 mg by mouth daily as needed for fluid or edema.     [name] guaiFENesin-dextromethorphan (ROBITUSSIN DM) 100-10 MG/5ML syrup Take 15 mLs by mouth every 4 (four) hours as needed for cough.    . hydrocortisone cream 1 % Apply 1 application topically daily as needed for itching.    [name] ketoconazole (NIZORAL) 2 % cream Apply 1 application topically daily as needed. Apply to affected area (rash).  6  . levothyroxine (SYNTHROID, LEVOTHROID) 50 MCG tablet Take 50 mcg by mouth daily.  3  . lidocaine-prilocaine (EMLA) cream Apply 1 application topically as needed. Apply to [facility] a cath one hour prior to needle stick. 30 g 3  . losartan (COZAAR) 50 MG tablet Take 50 mg by mouth daily.    . metoprolol succinate (TOPROL-XL) 50 MG 24 hr tablet Take 50 mg by mouth daily.  1  . mirtazapine (REMERON) 15 MG tablet Take 15 mg by mouth at  bedtime.   3  . Multiple Vitamins-Minerals (MULTIVITAMIN & MINERAL PO) Take 1 tablet by mouth daily.    [name] oxycodone (OXY-IR) 5 MG capsule Take 5 mg by mouth every 4 (four) hours as needed for pain.    . polyethylene glycol (MIRALAX / GLYCOLAX) packet Take 17 g by mouth daily as needed for mild constipation or moderate constipation.    . potassium chloride (MICRO-K) 10 MEQ CR capsule     . pravastatin (PRAVACHOL) 40 MG tablet Take 40 mg by mouth at bedtime.   2  . predniSONE (DELTASONE) 2.5 MG tablet Take 5 tablets by mouth daily before breakfast.    . promethazine (PHENERGAN) 25 MG tablet Take 1 tablet (25 mg total) by mouth every 6 (six) hours as needed for nausea or vomiting. 60 tablet 0  . ranitidine (ZANTAC) 300 MG tablet Take 300 mg by mouth at bedtime.  3  . senna-docusate (SENOKOT-S) 8.6-50 MG per tablet Take 1 tablet by mouth at bedtime.     . Vitamin D, Ergocalciferol, (DRISDOL) 50000 UNITS CAPS capsule Take 50,000 Units by mouth once a week. Every Sunday  3  . XOLAIR 150 MG injection Inject 150 mg into the skin every 28 (twenty-eight) days.     . zafirlukast (  ACCOLATE) 20 MG tablet Take 20 mg by mouth 2 (two) times daily.  3   No current facility-administered medications for this visit.    PHYSICAL EXAMINATION: ECOG PERFORMANCE STATUS: 2 - Symptomatic, <50% confined to bed  Filed Vitals:   01/13/15 1510  BP: 105/48  Pulse: 64  Temp: 99.7 F (37.6 C)  Resp: 18   There were no vitals filed for this visit.  GENERAL:alert, no distress and comfortable SKIN: She has extensive skin bruises everywhere. There is extensive bruises over her port area without any signs of infection. EYES: normal, Conjunctiva are pink and non-injected, sclera clear Musculoskeletal:no cyanosis of digits and no clubbing  NEURO: alert & oriented x 3 with fluent speech, no focal motor/sensory deficits  LABORATORY DATA:  I have reviewed the data as listed    Component Value Date/Time   NA 138  01/05/2015 1313   NA 136 11/26/2014 0550   K 4.5 01/05/2015 1313   K 3.7 11/26/2014 0550   CL 102 11/26/2014 0550   CO2 24 01/05/2015 1313   CO2 26 11/26/2014 0550   GLUCOSE 107 01/05/2015 1313   GLUCOSE 96 11/26/2014 0550   BUN 28.7* 01/05/2015 1313   BUN 21* 11/26/2014 0550   CREATININE 1.0 01/05/2015 1313   CREATININE 1.12* 11/26/2014 0550   CALCIUM 9.7 01/05/2015 1313   CALCIUM 9.1 11/26/2014 0550   PROT 5.6* 01/05/2015 1313   PROT 4.8* 11/22/2014 0513   ALBUMIN 3.5 01/05/2015 1313   ALBUMIN 3.0* 11/22/2014 0513   AST 21 01/05/2015 1313   AST 21 11/22/2014 0513   ALT 27 01/05/2015 1313   ALT 19 11/22/2014 0513   ALKPHOS 47 01/05/2015 1313   ALKPHOS 36* 11/22/2014 0513   BILITOT 0.80 01/05/2015 1313   BILITOT 1.6* 11/22/2014 0513   GFRNONAA 46* 11/26/2014 0550   GFRAA 53* 11/26/2014 0550    No results found for: SPEP, UPEP  Lab Results  Component Value Date   WBC 4.2 01/05/2015   NEUTROABS 2.6 01/05/2015   HGB 8.2* 01/05/2015   HCT 25.2* 01/05/2015   MCV 94.4 01/05/2015   PLT 142* 01/05/2015      Chemistry      Component Value Date/Time   NA 138 01/05/2015 1313   NA 136 11/26/2014 0550   K 4.5 01/05/2015 1313   K 3.7 11/26/2014 0550   CL 102 11/26/2014 0550   CO2 24 01/05/2015 1313   CO2 26 11/26/2014 0550   BUN 28.7* 01/05/2015 1313   BUN 21* 11/26/2014 0550   CREATININE 1.0 01/05/2015 1313   CREATININE 1.12* 11/26/2014 0550      Component Value Date/Time   CALCIUM 9.7 01/05/2015 1313   CALCIUM 9.1 11/26/2014 0550   ALKPHOS 47 01/05/2015 1313   ALKPHOS 36* 11/22/2014 0513   AST 21 01/05/2015 1313   AST 21 11/22/2014 0513   ALT 27 01/05/2015 1313   ALT 19 11/22/2014 0513   BILITOT 0.80 01/05/2015 1313   BILITOT 1.6* 11/22/2014 0513      ASSESSMENT & PLAN:  Easy bruising She had very thin skin and easy bruising. There is extensive bruises over the port site but clinically it does not appear infected. Her skin over the port has not healed  and it could cause her to be at risk of infection. I will cover her with 7 days of Keflex and reassess next week. We will put extra precaution to minimize the use of tapes. Next week, we will check her blood with just  a fingerstick because if her anemia responds to Aranesp, she may not need blood transfusion and we may not have to access her port.  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 We discussed the risk and benefit of radiation therapy. Ultimately, the patient does not want to pursue radiation therapy and just wants blood transfusion as needed to stay alive. I have strongly suggested palliative care and hospice. Apparently, the program that she is enrolled and would not allow her to receive blood transfusion while on hospice. She will continue to follow with me on a regular basis for supportive transfusion for now  Anemia in chronic illness The absence of significant reticulocytosis in the presence of recent GI bleed suggested component of anemia of chronic disease. Recent serum vitamin B-12 and iron studies were adequate. Serum erythropoietin level is low I discussed with her the risks and benefits of Aranesp injection I will start with inj every 2 weeks X 4 and reassess Goal is to keep hemoglobin > 10  if her hemoglobin is very low, the patient will get blood transfusion. She is due for Aranesp tomorrow. To reduce her transportation back and forth, we will proceed with Aranesp today.  Pacemaker at end of battery life  We have extensive discussion about the risks and benefits of changing the pacemaker battery. The patient has no further wishes in life and just want to be comfortable. Ultimately, she would not want to have her pacemaker battery exchanged. She is ready for palliative care and hospice  We discussed prognosis and what would happen if her pacemaker  failed. The patient was surprised to hear from another physician that she could have the pacemaker battery replaced and to have the pacemaker turned off if the patient started to decline. I told her that she may encounter poor wound healing in view of chronic prednisone therapy and that could cause risk of port infection down the road. After much discussion, she is in agreement not to have the pacemaker battery replaced.   Orders Placed This Encounter  Procedures  . CBC with Differential  . SCHEDULING COMMUNICATION INJECTION    Schedule injection appointment 15 min  . [facility]    Lab appointment 15 min.  [name] ARANESP TREATMENT CONDITION    Hold Aranesp: Chemotherapy Induced Anemia hold for Hemoglobin greater than 10 / Renal hold for Hemoglobin greater than 11   All questions were answered. The patient knows to call the clinic with any problems, questions or concerns. No barriers to learning was detected. I spent 30 minutes counseling the patient face to face. The total time spent in the appointment was 40 minutes and more than 50% was on counseling and review of test results     [facility], [place], MD 01/13/2015 4:48 PM

## 2015-01-13 NOTE — Telephone Encounter (Signed)
RN at facility notified and will try to transport patient here ASAP. I will put in POF.

## 2015-01-13 NOTE — Telephone Encounter (Signed)
[facility] called stating that patient is complaining about her port a cath being red, swollen, and painful to touch. Would you like patient to be seen by Cyn[facility]? Message sent to MD Gorsuch/RN Tammi

## 2015-01-13 NOTE — Assessment & Plan Note (Signed)
We have extensive discussion about the risks and benefits of changing the pacemaker battery. The patient has no further wishes in life and just want to be comfortable. Ultimately, she would not want to have her pacemaker battery exchanged. She is ready for palliative care and hospice  We discussed prognosis and what would happen if her pacemaker failed. The patient was surprised to hear from another physician that she could have the pacemaker battery replaced and to have the pacemaker turned off if the patient started to decline. I told her that she may encounter poor wound healing in view of chronic prednisone therapy and that could cause risk of port infection down the road. After much discussion, she is in agreement not to have the pacemaker battery replaced.

## 2015-01-13 NOTE — Assessment & Plan Note (Signed)
We have a long discussion about this Repeat EGD was highly suspicious for malignancy but biopsy was nonmalignant We discussed limitation of biopsy. We discussed the risk and benefit of radiation therapy. Ultimately, the patient does not want to pursue radiation therapy and just wants blood transfusion as needed to stay alive. I have strongly suggested palliative care and hospice. Apparently, the program that she is enrolled and would not allow her to receive blood transfusion while on hospice. She will continue to follow with me on a regular basis for supportive transfusion for now

## 2015-01-14 ENCOUNTER — Other Ambulatory Visit: Payer: Self-pay

## 2015-01-14 ENCOUNTER — Ambulatory Visit: Payer: Self-pay

## 2015-01-14 ENCOUNTER — Ambulatory Visit: Payer: Self-pay | Admitting: Hematology and Oncology

## 2015-01-14 ENCOUNTER — Ambulatory Visit: Payer: Medicare Other

## 2015-01-15 ENCOUNTER — Ambulatory Visit: Payer: Medicare Other

## 2015-01-16 ENCOUNTER — Telehealth: Payer: Self-pay | Admitting: *Deleted

## 2015-01-16 ENCOUNTER — Ambulatory Visit (HOSPITAL_BASED_OUTPATIENT_CLINIC_OR_DEPARTMENT_OTHER): Payer: Medicare Other | Admitting: Nurse Practitioner

## 2015-01-16 ENCOUNTER — Other Ambulatory Visit: Payer: Self-pay | Admitting: *Deleted

## 2015-01-16 ENCOUNTER — Encounter: Payer: Self-pay | Admitting: Nurse Practitioner

## 2015-01-16 ENCOUNTER — Ambulatory Visit: Payer: Medicare Other

## 2015-01-16 ENCOUNTER — Other Ambulatory Visit (HOSPITAL_COMMUNITY)
Admit: 2015-01-16 | Discharge: 2015-01-16 | Disposition: A | Discharge status: Home / Self Care | Payer: Medicare Other | Source: Other Acute Inpatient Hospital | Attending: Hematology and Oncology | Admitting: Hematology and Oncology

## 2015-01-16 ENCOUNTER — Telehealth: Payer: Self-pay | Admitting: Nurse Practitioner

## 2015-01-16 VITALS — BP 116/92 | HR 65 | Temp 98.4°F | Resp 18

## 2015-01-16 DIAGNOSIS — C50919 Malignant neoplasm of unspecified site of unspecified female breast: Secondary | ICD-10-CM

## 2015-01-16 DIAGNOSIS — Z853 Personal history of malignant neoplasm of breast: Secondary | ICD-10-CM

## 2015-01-16 DIAGNOSIS — Z85820 Personal history of malignant melanoma of skin: Secondary | ICD-10-CM | POA: Diagnosis not present

## 2015-01-16 DIAGNOSIS — D509 Iron deficiency anemia, unspecified: Secondary | ICD-10-CM | POA: Diagnosis present

## 2015-01-16 DIAGNOSIS — T82898A Other specified complication of vascular prosthetic devices, implants and grafts, initial encounter: Secondary | ICD-10-CM

## 2015-01-16 DIAGNOSIS — T80212A Local infection due to central venous catheter, initial encounter: Secondary | ICD-10-CM | POA: Diagnosis present

## 2015-01-16 DIAGNOSIS — T829XXA Unspecified complication of cardiac and vascular prosthetic device, implant and graft, initial encounter: Secondary | ICD-10-CM | POA: Insufficient documentation

## 2015-01-16 NOTE — Assessment & Plan Note (Addendum)
Patient had right upper chest Port-A-Cath inserted per Dr. Sandi Mariscal from [place] on 11/27/2014.  Patient has a history of chronic prednisone therapy; and therefore has chronic thin skin and bruising.  It appears the patient's Port-A-Cath site had some increased bruising surrounding the site; and now has an area that has opened directly over the Port-A-Cath hub.  The opened area directly over the Port-A-Cath hub site is oozing trace amount of blood.  There is no purulent fluid noted.  There is no surrounding erythema, edema, warmth, tenderness, or red streaks.  Patient was seen per Dr. Alvy Bimler earlier this week; and Keflex anti-biotics was initiated prophylactically.  Did obtain a wound culture from the Port-A-Cath site today as well.  Cleaned site and applied new dressing.  Advice patient to continue taking the anti-biotics as directed.  Advised patient also that I would call her first thing Monday morning.  01/19/2015 to check in with her.  May very well need further evaluation and management per Dr. Sandi Mariscal if site does not improve.  Also, advised patient to go directly to the emergency department over the weekend for any worsening symptoms whatsoever.

## 2015-01-16 NOTE — Telephone Encounter (Signed)
PT.'S PORT A CATH IS RED,SWOLLEN, AND BLEEDING. VERBAL ORDER AND READ BACK TO [place] AND CYNDEE [place]- PT. TO COME TO THE OFFICE NOW. NOTIFIED DANIELLE.

## 2015-01-16 NOTE — Progress Notes (Signed)
SYMPTOM MANAGEMENT CLINIC   HPI: [name] 81 y.o. female diagnosed with:  Patient has a history of remote breast cancer in 2003, melanoma, duodenal mass, and iron deficiency anemia.  Patient is currently receiving Aranesp injections on an every two-week basis.  Patient received her last Aranesp injection on 01/13/2015.  Parameters to hold the Aranesp injection is when the hemoglobin is greater than 10.  Patient also continues to receive transfusional support on an as-needed basis.  Patient continues with chronic steroid therapy.  Also noted.-Patient has been advised that her pacemaker battery is at the end of its life; the patient refuses to have the battery placed at this time.  Patient is scheduled to return on 01/21/2015 for labs and a possible blood transfusion if needed.  Patient had right upper chest Port-A-Cath inserted per Dr. Sandi Mariscal from [facility] imaging on 11/27/2014.  Patient has a history of chronic prednisone therapy; and therefore has chronic thin skin and bruising.  It appears the patient's Port-A-Cath site had some increased bruising surrounding the site; and now has an area that has opened directly over the Port-A-Cath hub.  The opened area directly over the Port-A-Cath hub site is oozing trace amount of blood.  There is no purulent fluid noted.  There is no surrounding erythema, edema, warmth, tenderness, or red streaks.  Patient was seen per Dr. Alvy Bimler earlier this week; and Keflex anti-biotics was initiated prophylactically.  Did obtain a wound culture from the Port-A-Cath site today as well.  HPI  ROS  Past Medical History  Diagnosis Date  . Asthma   . Presence of permanent cardiac pacemaker   . Hypertension   . Hypothyroidism   . Anxiety   . Cancer     breast; finger  . PONV (postoperative nausea and vomiting)   . Insomnia, unspecified   . Osteoarthrosis, unspecified whether generalized or localized, unspecified site   . Unspecified hypothyroidism    . Unspecified essential hypertension   . Atrial fibrillation   . Sciatica   . Anemia   . COPD (chronic obstructive pulmonary disease)   . UTI (urinary tract infection)   . GERD (gastroesophageal reflux disease)   . Pneumonia   . Asthma   . Clostridium difficile colitis   . Diverticulitis of sigmoid colon 11/2010    with abscess/contained perforation  . Dementia     mild  . Dehydration with hyponatremia 12/2010    hospitalized  . Angiodysplasia of stomach     ablated 2010  . Angiodysplasia of colon     ablated 2010  . Vitamin B12 deficiency (dietary) anemia 04/12/2011  . Shingles   . Sarcoma     right hand  . Malignant neoplasm of breast (female), unspecified site     left  . Dupuytren's disease     left hand  . Vitamin B12 deficiency 04/02/2012  . Complication of anesthesia     " very cautious because of my COPD"  . Pacemaker   . Gastric mass 12/02/2014  . Renal insufficiency 11/21/2014  . History of oxygen administration     12-17-14 using oxygen 2 l/m nasally bedtime always and if needed with exertion  . Transfusion history     12-17-14- last transfusion 12-03-14   . Skin lesion 01/13/2015    Past Surgical History  Procedure Laterality Date  . Breast surgery      left breast mammosite surgery  . Insert / replace / remove pacemaker    . Enteroscopy N/A 11/24/2014  Procedure: ENTEROSCOPY;  Surgeon: Ladene Artist, MD;  Location: [facility] ENDOSCOPY;  Service: Endoscopy;  Laterality: N/A;  . Colectomy with colostomy creation/hartmann procedure  2013  . Abdominal wound dehiscence      stratus mesh placed with g-tube  . Nasal sinus surgery      x 3  . Appendectomy    . Hernia repair    . Abdominal hysterectomy    . Pacemaker insertion    . Eye surgery      x6-bilateral  . Finger amputation      right  . Foot surgery      right toes x 3  . Tonsillectomy    . Colonoscopy  01/2009    w/ablation of 3 mm AVM, diverticulosis, external hemorrhoids  . Upper gastrointestinal  endoscopy  01/2009    w/polypectomy, AVM, hyperplastic polyp  . Abdominal hysterectomy    . Laparotomy  11/12/2011    Procedure: EXPLORATORY LAPAROTOMY;  Surgeon: Madilyn Hook, DO;  Location: [facility] ORS;  Service: General;  Laterality: N/A;  . Colostomy revision  11/12/2011    Procedure: COLON RESECTION SIGMOID;  Surgeon: Madilyn Hook, DO;  Location: [facility] ORS;  Service: General;  Laterality: N/A;  . Colostomy  11/12/2011    Procedure: COLOSTOMY;  Surgeon: Madilyn Hook, DO;  Location: [facility] ORS;  Service: General;  Laterality: Left;  End colostomy  . Laparotomy  11/19/2011    Procedure: EXPLORATORY LAPAROTOMY;  Surgeon: Shann Medal, MD;  Location: [facility] ORS;  Service: General;  Laterality: N/A;  . Gastrostomy  11/19/2011    Procedure: GASTROSTOMY;  Surgeon: Shann Medal, MD;  Location: [facility] ORS;  Service: General;;  INSERTION GASTROSTOMY TUBE  . Application of wound vac  11/19/2011    Procedure: APPLICATION OF WOUND VAC;  Surgeon: Shann Medal, MD;  Location: [facility] OR[facility];  Service: General;  Laterality: N/A;  . Colon surgery    . Esophagogastroduodenoscopy (egd) with propofol N/A 12/25/2014    Procedure: ESOPHAGOGASTRODUODENOSCOPY (EGD) WITH PROPOFOL;  Surgeon: Milus Banister, MD;  Location: [facility] ENDOSCOPY;  Service: Endoscopy;  Laterality: N/A;    has Malignant neoplasm of female breast; HISTORY OF SARCOMA RIGHT HAND; HYPOTHYROIDISM; Iron deficiency anemia due to chronic blood loss; Atrial fibrillation; HEART FAILURE; ALLERGIC RHINITIS; Other diseases of vocal cords; ASTHMA; GERD; OSTEOARTHRITIS; P[employer]; MULTIFOCAL ATRIAL TACHYCARDIA; Warfarin anticoagulation; Dementia; Vitamin B12 deficiency (dietary) anemia; Chronic LLQ pain and tenderness; Loose stools; Hx of Clostridium difficile infection; History of hypertension; Respiratory failure following trauma and surgery; Chronic obstructive asthma; Perforated diverticulitis; Peritonitis; Hypotension; Acute respiratory failure; Vitamin B12 deficiency; Fracture of  multiple pubic rami; Chronic respiratory failure; Pneumonia; Acute diastolic heart failure; Vocal cord dysfunction; Hypoxemia; Asthma with acute exacerbation; Ventral hernia; Anxiety state, unspecified; Iron deficiency anemia; Anemia; Epistaxis; COPD exacerbation; Gastric mass; Anemia in chronic illness; Duodenal mass; GI bleed; Anemia; Epistaxis; Renal insufficiency; Acute blood loss anemia; Acute GI bleeding; Other emphysema; Essential hypertension; Acute renal failure syndrome; Depression; Other specified hypothyroidism; Duodenal mass; Absolute anemia; Occult blood in stools; Palliative care encounter; Poor venous access; Pacemaker at end of battery life; Adenocarcinoma of duodenum; Skin lesion; Easy bruising; and Central line complication on her problem list.    is allergic to aspirin; sulfonamide derivatives; ibuprofen; iron dextran; sulfa antibiotics; tape; and metoclopramide hcl.    Medication List       This list is accurate as of: 01/16/15  6:37 PM.  Always use your most recent med list.  acetaminophen 325 MG tablet  Commonly known as:  TYLENOL  Take 650 mg by mouth every 6 (six) hours as needed for mild pain.     albuterol 108 (90 BASE) MCG/ACT inhaler  Commonly known as:  PROVENTIL HFA;VENTOLIN HFA  Inhale 2 puffs into the lungs 2 (two) times daily.     albuterol (2.5 MG/3ML) 0.083% nebulizer solution  Commonly known as:  PROVENTIL  Take 2.5 mg by nebulization every 4 (four) hours as needed for wheezing or shortness of breath.     ALPRAZolam 0.25 MG tablet  Commonly known as:  XANAX  Take 0.25 mg by mouth at bedtime.     baclofen 10 MG tablet  Commonly known as:  LIORESAL  Take 10 mg by mouth every 8 (eight) hours as needed for muscle spasms.     beclomethasone 80 MCG/ACT inhaler  Commonly known as:  QVAR  Inhale 3 puffs into the lungs 2 (two) times daily.     budesonide 32 MCG/ACT nasal spray  Commonly known as:  RHINOCORT AQUA  Place 1 spray into both  nostrils 2 (two) times daily.     Calcium Carbonate-Vitamin D 600-400 MG-UNIT per tablet  Take 1 tablet by mouth daily.     cephALEXin 500 MG capsule  Commonly known as:  KEFLEX  Take 1 capsule (500 mg total) by mouth 2 (two) times daily.     DEXILANT 60 MG capsule  Generic drug:  dexlansoprazole  Take 60 mg by mouth daily before breakfast.     diphenhydrAMINE 25 mg capsule  Commonly known as:  BENADRYL  Take 25 mg by mouth daily as needed for itching.     DULoxetine 30 MG capsule  Commonly known as:  CYMBALTA  Take 30 mg by mouth at bedtime.     EPIPEN IJ  Inject 1 application as directed as directed. AS NEEDED for anaphylaxis     Eszopiclone 3 MG Tabs  Take 3 mg by mouth daily as needed (sleep).     furosemide 40 MG tablet  Commonly known as:  LASIX  Take 40 mg by mouth daily as needed for fluid or edema.     guaiFENesin-dextromethorphan 100-10 MG/5ML syrup  Commonly known as:  ROBITUSSIN DM  Take 15 mLs by mouth every 4 (four) hours as needed for cough.     hydrocortisone cream 1 %  Apply 1 application topically daily as needed for itching.     ketoconazole 2 % cream  Commonly known as:  NIZORAL  Apply 1 application topically daily as needed. Apply to affected area (rash).     levothyroxine 50 MCG tablet  Commonly known as:  SYNTHROID, LEVOTHROID  Take 50 mcg by mouth daily.     lidocaine-prilocaine cream  Commonly known as:  EMLA  Apply 1 application topically as needed. Apply to [facility] a cath one hour prior to needle stick.     losartan 50 MG tablet  Commonly known as:  COZAAR  Take 50 mg by mouth daily.     LUBRICANT EYE DROPS OP  Apply 2 drops to eye 2 (two) times daily.     metoprolol succinate 50 MG 24 hr tablet  Commonly known as:  TOPROL-XL  Take 50 mg by mouth daily.     mirtazapine 15 MG tablet  Commonly known as:  REMERON  Take 15 mg by mouth at bedtime.     MULTIVITAMIN & MINERAL PO  Take 1 tablet by mouth daily.     oxycodone  5 MG  capsule  Commonly known as:  OXY-IR  Take 5 mg by mouth every 4 (four) hours as needed for pain.     polyethylene glycol packet  Commonly known as:  MIRALAX / GLYCOLAX  Take 17 g by mouth daily as needed for mild constipation or moderate constipation.     potassium chloride 10 MEQ CR capsule  Commonly known as:  MICRO-K     pravastatin 40 MG tablet  Commonly known as:  PRAVACHOL  Take 40 mg by mouth at bedtime.     predniSONE 2.5 MG tablet  Commonly known as:  DELTASONE  Take 5 tablets by mouth daily before breakfast.     promethazine 25 MG tablet  Commonly known as:  PHENERGAN  Take 1 tablet (25 mg total) by mouth every 6 (six) hours as needed for nausea or vomiting.     ranitidine 300 MG tablet  Commonly known as:  ZANTAC  Take 300 mg by mouth at bedtime.     senna-docusate 8.6-50 MG per tablet  Commonly known as:  Senokot-S  Take 1 tablet by mouth at bedtime.     Vitamin D (Ergocalciferol) 50000 UNITS Caps capsule  Commonly known as:  DRISDOL  Take 50,000 Units by mouth once a week. Every Sunday     XOLAIR 150 MG injection  Generic drug:  omalizumab  Inject 150 mg into the skin every 28 (twenty-eight) days.     zafirlukast 20 MG tablet  Commonly known as:  ACCOLATE  Take 20 mg by mouth 2 (two) times daily.         PHYSICAL EXAMINATION  Oncology Vitals 01/16/2015 01/13/2015 01/07/2015 01/07/2015 01/07/2015 01/07/2015 01/07/2015  Height - - - - - - -  Weight - - - - - - -  Weight (lbs) - - - - - - -  BMI (kg/m2) - - - - - - -  Temp 98.4 99.7 99.7 99.2 98.2 98.9 96.8  Pulse 65 64 65 65 65 65 65  Resp 18 18 18 18 18 18 18   Resp (Historical as of 01/19/12) - - - - - - -  SpO2 98 95 98 99 100 97 100  BSA (m2) - - - - - - -   BP Readings from Last 3 Encounters:  01/16/15 116/92  01/13/15 105/48  01/07/15 117/50    Physical Exam  Constitutional: She is oriented to person, place, and time.  Patient appears fatigued, weak, frail, and chronically ill.  HENT:    Head: Normocephalic and atraumatic.  Eyes: Conjunctivae and EOM are normal. Pupils are equal, round, and reactive to light. Right eye exhibits no discharge. Left eye exhibits no discharge. No scleral icterus.  Neck: Normal range of motion.  Pulmonary/Chest: Effort normal. No respiratory distress.  Musculoskeletal: Normal range of motion. She exhibits no edema or tenderness.  Neurological: She is alert and oriented to person, place, and time.  Skin: Skin is warm and dry. There is pallor.    It appears the patient's Port-A-Cath site had some increased bruising surrounding the site; and now has an area that has opened directly over the Port-A-Cath hub.  The opened area directly over the Port-A-Cath hub site is oozing trace amount of blood.  There is no purulent fluid noted.  There is no surrounding erythema, edema, warmth, tenderness, or red streaks.        Nursing note and vitals reviewed.   LABORATORY DATA:. No visits with results within 3 Day(s) from this  visit. Latest known visit with results is:  Appointment on 01/05/2015  Component Date Value Ref Range Status  . WBC 01/05/2015 4.2  3.9 - 10.3 10e3/uL Final  . NEUT# 01/05/2015 2.6  1.5 - 6.5 10e3/uL Final  . HGB 01/05/2015 8.2* 11.6 - 15.9 g/dL Final  . HCT 01/05/2015 25.2* 34.8 - 46.6 % Final  . Platelets 01/05/2015 142* 145 - 400 10e3/uL Final  . MCV 01/05/2015 94.4  79.5 - 101.0 fL Final  . MCH 01/05/2015 30.7  25.1 - 34.0 pg Final  . MCHC 01/05/2015 32.5  31.5 - 36.0 g/dL Final  . RBC 01/05/2015 2.67* 3.70 - 5.45 10e6/uL Final  . RDW 01/05/2015 16.6* 11.2 - 14.5 % Final  . lymph# 01/05/2015 1.0  0.9 - 3.3 10e3/uL Final  . MONO# 01/05/2015 0.4  0.1 - 0.9 10e3/uL Final  . Eosinophils Absolute 01/05/2015 0.1  0.0 - 0.5 10e3/uL Final  . Basophils Absolute 01/05/2015 0.0  0.0 - 0.1 10e3/uL Final  . NEUT% 01/05/2015 62.9  38.4 - 76.8 % Final  . LYMPH% 01/05/2015 24.8  14.0 - 49.7 % Final  . MONO% 01/05/2015 10.4  0.0 - 14.0 %  Final  . EOS% 01/05/2015 1.7  0.0 - 7.0 % Final  . BASO% 01/05/2015 0.2  0.0 - 2.0 % Final  . Retic % 01/05/2015 2.59* 0.70 - 2.10 % Final  . Retic Ct Abs 01/05/2015 69.15  33.70 - 90.70 10e3/uL Final  . Immature Retic Fract 01/05/2015 11.60* 1.60 - 10.00 % Final  . Sodium 01/05/2015 138  136 - 145 mEq/L Final  . Potassium 01/05/2015 4.5  3.5 - 5.1 mEq/L Final  . Chloride 01/05/2015 106  98 - 109 mEq/L Final  . CO2 01/05/2015 24  22 - 29 mEq/L Final  . Glucose 01/05/2015 107  70 - 140 mg/dl Final  . BUN 01/05/2015 28.7* 7.0 - 26.0 mg/dL Final  . Creatinine 01/05/2015 1.0  0.6 - 1.1 mg/dL Final  . Total Bilirubin 01/05/2015 0.80  0.20 - 1.20 mg/dL Final  . Alkaline Phosphatase 01/05/2015 47  40 - 150 U/L Final  . AST 01/05/2015 21  5 - 34 U/L Final  . ALT 01/05/2015 27  0 - 55 U/L Final  . Total Protein 01/05/2015 5.6* 6.4 - 8.3 g/dL Final  . Albumin 01/05/2015 3.5  3.5 - 5.0 g/dL Final  . Calcium 01/05/2015 9.7  8.4 - 10.4 mg/dL Final  . Anion Gap 01/05/2015 8  3 - 11 mEq/L Final  . EGFR 01/05/2015 53* >90 ml/min/1.73 m2 Final   eGFR is calculated using the CKD-EPI Creatinine Equation (2009)  . Hold Tube, Blood Bank 01/05/2015 Type and Crossmatch Added   Final  . Technologist Review 01/05/2015 variant lymphs, Metas and Myelocytes present   Final   Right portacath site:       RADIOGRAPHIC STUDIES: No results found.  ASSESSMENT/PLAN:    Central line complication Patient had right upper chest Port-A-Cath inserted per Dr. Sandi Mariscal from [facility] [place] on 11/27/2014.  Patient has a history of chronic prednisone therapy; and therefore has chronic thin skin and bruising.  It appears the patient's Port-A-Cath site had some increased bruising surrounding the site; and now has an area that has opened directly over the Port-A-Cath hub.  The opened area directly over the Port-A-Cath hub site is oozing trace amount of blood.  There is no purulent fluid noted.  There is no surrounding  erythema, edema, warmth, tenderness, or red streaks.  Patient was seen  per Dr. Alvy Bimler earlier this week; and Keflex anti-biotics was initiated prophylactically.  Did obtain a wound culture from the Port-A-Cath site today as well.  Cleaned site and applied new dressing.  Advice patient to continue taking the anti-biotics as directed.  Advised patient also that I would call her first thing Monday morning.  01/19/2015 to check in with her.  May very well need further evaluation and management per Dr. Sandi Mariscal if site does not improve.  Also, advised patient to go directly to the emergency department over the weekend for any worsening symptoms whatsoever.  Iron deficiency anemia Patient has a history of remote breast cancer in 2003, melanoma, duodenal mass, and iron deficiency anemia.  Patient is currently receiving Aranesp injections on an every two-week basis.  Patient received her last Aranesp injection on 01/13/2015.  Parameters to hold the Aranesp injection is when the hemoglobin is greater than 10.  Patient also continues to receive transfusional support on an as-needed basis.  Patient continues with chronic steroid therapy.  Also noted.-Patient has been advised that her pacemaker battery is at the end of its life; the patient refuses to have the battery placed at this time.  Patient is scheduled to return on 01/21/2015 for labs and a possible blood transfusion if needed.   Patient stated understanding of all instructions; and was in agreement with this plan of care. The patient knows to call the clinic with any problems, questions or concerns.   Review/collaboration with Dr. Alvy Bimler regarding all aspects of patient's visit today.   Total time spent with patient was 40 minutes;  with greater than 75 percent of that time spent in face to face counseling regarding patient's symptoms,  and coordination of care and follow up.  Disclaimer: This note was dictated with voice recognition  software. Similar sounding words can inadvertently be transcribed and may not be corrected upon review.   Drue Second, NP 01/16/2015

## 2015-01-16 NOTE — Assessment & Plan Note (Addendum)
Patient has a history of remote breast cancer in 2003, melanoma, duodenal mass, and iron deficiency anemia.  Patient is currently receiving Aranesp injections on an every two-week basis.  Patient received her last Aranesp injection on 01/13/2015.  Parameters to hold the Aranesp injection is when the hemoglobin is greater than 10.  Patient also continues to receive transfusional support on an as-needed basis.  Patient continues with chronic steroid therapy.  Also noted.-Patient has been advised that her pacemaker battery is at the end of its life; the patient refuses to have the battery placed at this time.  Patient is scheduled to return on 01/21/2015 for labs and a possible blood transfusion if needed.

## 2015-01-16 NOTE — Telephone Encounter (Signed)
Per 07/29 POF, pt seeing NP/CB pt is aware... KJ

## 2015-01-17 ENCOUNTER — Other Ambulatory Visit: Payer: Self-pay | Admitting: Internal Medicine

## 2015-01-19 ENCOUNTER — Telehealth: Payer: Self-pay

## 2015-01-19 ENCOUNTER — Other Ambulatory Visit: Payer: Self-pay | Admitting: Nurse Practitioner

## 2015-01-19 ENCOUNTER — Telehealth: Payer: Self-pay | Admitting: Hematology and Oncology

## 2015-01-19 ENCOUNTER — Ambulatory Visit (HOSPITAL_BASED_OUTPATIENT_CLINIC_OR_DEPARTMENT_OTHER): Payer: Medicare Other

## 2015-01-19 ENCOUNTER — Ambulatory Visit: Payer: Medicare Other

## 2015-01-19 ENCOUNTER — Ambulatory Visit (HOSPITAL_BASED_OUTPATIENT_CLINIC_OR_DEPARTMENT_OTHER): Payer: Medicare Other | Admitting: Hematology and Oncology

## 2015-01-19 ENCOUNTER — Ambulatory Visit (HOSPITAL_COMMUNITY)
Admission: RE | Admit: 2015-01-19 | Discharge: 2015-01-19 | Disposition: A | Discharge status: Home / Self Care | Payer: Medicare Other | Source: Ambulatory Visit | Attending: Hematology and Oncology | Admitting: Hematology and Oncology

## 2015-01-19 VITALS — BP 128/45 | HR 65 | Temp 98.5°F | Resp 18 | Ht 64.5 in | Wt 174.5 lb

## 2015-01-19 DIAGNOSIS — D631 Anemia in chronic kidney disease: Secondary | ICD-10-CM | POA: Insufficient documentation

## 2015-01-19 DIAGNOSIS — L989 Disorder of the skin and subcutaneous tissue, unspecified: Secondary | ICD-10-CM | POA: Diagnosis present

## 2015-01-19 DIAGNOSIS — D638 Anemia in other chronic diseases classified elsewhere: Secondary | ICD-10-CM

## 2015-01-19 DIAGNOSIS — N189 Chronic kidney disease, unspecified: Secondary | ICD-10-CM | POA: Insufficient documentation

## 2015-01-19 DIAGNOSIS — D630 Anemia in neoplastic disease: Secondary | ICD-10-CM

## 2015-01-19 DIAGNOSIS — D509 Iron deficiency anemia, unspecified: Secondary | ICD-10-CM

## 2015-01-19 LAB — WOUND CULTURE: CULTURE: NO GROWTH

## 2015-01-19 LAB — CBC & DIFF AND RETIC
BASO%: 0.2 % (ref 0.0–2.0)
Basophils Absolute: 0 10*3/uL (ref 0.0–0.1)
EOS%: 1.9 % (ref 0.0–7.0)
Eosinophils Absolute: 0.1 10*3/uL (ref 0.0–0.5)
HCT: 24.6 % — ABNORMAL LOW (ref 34.8–46.6)
HGB: 8.1 g/dL — ABNORMAL LOW (ref 11.6–15.9)
IMMATURE RETIC FRACT: 10.9 % — AB (ref 1.60–10.00)
LYMPH#: 1 10*3/uL (ref 0.9–3.3)
LYMPH%: 22.1 % (ref 14.0–49.7)
MCH: 30.7 pg (ref 25.1–34.0)
MCHC: 32.9 g/dL (ref 31.5–36.0)
MCV: 93.2 fL (ref 79.5–101.0)
MONO#: 0.5 10*3/uL (ref 0.1–0.9)
MONO%: 10 % (ref 0.0–14.0)
NEUT#: 3 10*3/uL (ref 1.5–6.5)
NEUT%: 65.8 % (ref 38.4–76.8)
PLATELETS: 199 10*3/uL (ref 145–400)
RBC: 2.64 10*6/uL — ABNORMAL LOW (ref 3.70–5.45)
RDW: 17 % — ABNORMAL HIGH (ref 11.2–14.5)
RETIC CT ABS: 69.7 10*3/uL (ref 33.70–90.70)
Retic %: 2.64 % — ABNORMAL HIGH (ref 0.70–2.10)
WBC: 4.6 10*3/uL (ref 3.9–10.3)
nRBC: 1 % — ABNORMAL HIGH (ref 0–0)

## 2015-01-19 LAB — TECHNOLOGIST REVIEW

## 2015-01-19 NOTE — Telephone Encounter (Signed)
S/w pt that [name] wants to see her today at 330 pm. She stated she would get to [facility]. The nurse is with her as we speak and it looks like the port is coming up through the skin. Advised her to put new dressing on the port and we will see her.

## 2015-01-19 NOTE — Telephone Encounter (Signed)
s.w. pt and advised on todays appt.....pt ok and aware °

## 2015-01-19 NOTE — Telephone Encounter (Signed)
Dressing was clean Saturday and Sunday. There was some blood on dressing this morning. The nurse has not changed the dressing yet this AM. Pt is concerned about how to get accessed on Wed if she needs a blood transfusion.

## 2015-01-20 ENCOUNTER — Encounter: Payer: Self-pay | Admitting: Hematology and Oncology

## 2015-01-20 ENCOUNTER — Ambulatory Visit: Payer: Medicare Other

## 2015-01-20 ENCOUNTER — Telehealth: Payer: Self-pay | Admitting: Hematology and Oncology

## 2015-01-20 NOTE — Progress Notes (Signed)
[name] OFFICE PROGRESS NOTE  Patient Care Team: Prince Solian, MD as PCP - General (Internal Medicine) Gatha Mayer, MD as Consulting Physician (Gastroenterology) Evans Lance, MD as Consulting Physician (Cardiology) Collene Gobble, MD as Consulting Physician (Pulmonary Disease) Jiles Prows, MD as Attending Physician ([place]) Heath Lark, MD as Consulting Physician (Hematology and Oncology) Prince Solian, MD  SUMMARY OF ONCOLOGIC HISTORY:  This patient have remote history of breast cancer diagnosed from screening mammogram in 2003. She had surgery, radiation therapy and adjuvant tamoxifen. 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 She had received B12 injection for B12 deficiency and intravenous iron for iron deficiency anemia. The patient had history of allergic reaction to intravenous iron dextran. She received intravenous iron feraheme in November 5170 without complications. In May 2016, she was found to have melanoma and significant GI bleed. On 11/24/2014, EGD and small bowel endoscopy revealed gastritis, AVM and duodenal mass. Biopsy showed nonmalignant tubular adenoma.Accession: [id number] CT scan of the abdomen dated 11/13/2014 show no retroperitoneal or other acute hemorrhage. Descending colostomy with peristomal hernia containing nonobstructed bowel. Possible cirrhosis She have recurrent admission to the hospital for severe anemia. Her case was recently presented at GI tumor board  Repeat EGD on 12/25/2014 again showed malignant mass but biopsy was benign From 12/17/2014 to present, she received intermittent Aranesp injection and blood transfusion  INTERVAL HISTORY: Please see below for problem oriented charting.  she is seen urgently today because of intermittent bleeding from the port sites.  she is complaining of significant fatigue. The patient denies any recent signs or  symptoms of bleeding such as spontaneous epistaxis, hematuria or hematochezia.   REVIEW OF SYSTEMS:   Constitutional: Denies fevers, chills or abnormal weight loss Eyes: Denies blurriness of vision Ears, nose, mouth, throat, and face: Denies mucositis or sore throat Respiratory: Denies cough, dyspnea or wheezes Cardiovascular: Denies palpitation, chest discomfort or lower extremity swelling Gastrointestinal:  Denies nausea, heartburn or change in bowel habits Lymphatics: Denies new lymphadenopathy or easy bruising Neurological:Denies numbness, tingling or new weaknesses Behavioral/Psych: Mood is stable, no new changes  All other systems were reviewed with the patient and are negative.  I have reviewed the past medical history, past surgical history, social history and family history with the patient and they are unchanged from previous note.  ALLERGIES:  is allergic to aspirin; sulfonamide derivatives; ibuprofen; iron dextran; sulfa antibiotics; tape; and metoclopramide hcl.  MEDICATIONS:  Current Outpatient Prescriptions  Medication Sig Dispense Refill  . acetaminophen (TYLENOL) 325 MG tablet Take 650 mg by mouth every 6 (six) hours as needed for mild pain.     [name] albuterol (PROVENTIL HFA;VENTOLIN HFA) 108 (90 BASE) MCG/ACT inhaler Inhale 2 puffs into the lungs 2 (two) times daily.    [name] albuterol (PROVENTIL) (2.5 MG/3ML) 0.083% nebulizer solution Take 2.5 mg by nebulization every 4 (four) hours as needed for wheezing or shortness of breath.    . ALPRAZolam (XANAX) 0.25 MG tablet Take 0.25 mg by mouth at bedtime.     . baclofen (LIORESAL) 10 MG tablet Take 10 mg by mouth every 8 (eight) hours as needed for muscle spasms.     . beclomethasone (QVAR) 80 MCG/ACT inhaler Inhale 3 puffs into the lungs 2 (two) times daily.     . budesonide (RHINOCORT AQUA) 32 MCG/ACT nasal spray Place 1 spray into both nostrils 2 (two) times daily.     . Calcium  Carbonate-Vitamin D 600-400 MG-UNIT per tablet  Take 1 tablet by mouth daily.    . Carboxymethylcellulose Sodium (LUBRICANT EYE DROPS OP) Apply 2 drops to eye 2 (two) times daily.     . cephALEXin (KEFLEX) 500 MG capsule Take 1 capsule (500 mg total) by mouth 2 (two) times daily. 14 capsule 0  . DEXILANT 60 MG capsule Take 60 mg by mouth daily before breakfast.  3  . diphenhydrAMINE (BENADRYL) 25 mg capsule Take 25 mg by mouth daily as needed for itching.     . DULoxetine (CYMBALTA) 30 MG capsule Take 30 mg by mouth at bedtime.  3  . EPINEPHrine (EPIPEN IJ) Inject 1 application as directed as directed. AS NEEDED for anaphylaxis    . Eszopiclone 3 MG TABS Take 3 mg by mouth daily as needed (sleep).   2  . furosemide (LASIX) 40 MG tablet Take 40 mg by mouth daily as needed for fluid or edema.     [name] guaiFENesin-dextromethorphan (ROBITUSSIN DM) 100-10 MG/5ML syrup Take 15 mLs by mouth every 4 (four) hours as needed for cough.    . hydrocortisone cream 1 % Apply 1 application topically daily as needed for itching.    [name] ketoconazole (NIZORAL) 2 % cream Apply 1 application topically daily as needed. Apply to affected area (rash).  6  . levothyroxine (SYNTHROID, LEVOTHROID) 50 MCG tablet Take 50 mcg by mouth daily.  3  . lidocaine-prilocaine (EMLA) cream Apply 1 application topically as needed. Apply to [name] a cath one hour prior to needle stick. 30 g 3  . losartan (COZAAR) 50 MG tablet Take 50 mg by mouth daily.    . metoprolol succinate (TOPROL-XL) 50 MG 24 hr tablet Take 50 mg by mouth daily.  1  . metoprolol succinate (TOPROL-XL) 50 MG 24 hr tablet TAKE 1 TABLET BY MOUTH EVERY DAY 90 tablet 1  . mirtazapine (REMERON) 15 MG tablet Take 15 mg by mouth at bedtime.   3  . Multiple Vitamins-Minerals (MULTIVITAMIN & MINERAL PO) Take 1 tablet by mouth daily.    [name] oxycodone (OXY-IR) 5 MG capsule Take 5 mg by mouth every 4 (four) hours as needed for pain.    . polyethylene glycol (MIRALAX / GLYCOLAX) packet Take 17 g by mouth daily as needed for mild  constipation or moderate constipation.    . potassium chloride (MICRO-K) 10 MEQ CR capsule     . pravastatin (PRAVACHOL) 40 MG tablet Take 40 mg by mouth at bedtime.   2  . predniSONE (DELTASONE) 2.5 MG tablet Take 5 tablets by mouth daily before breakfast.    . promethazine (PHENERGAN) 25 MG tablet Take 1 tablet (25 mg total) by mouth every 6 (six) hours as needed for nausea or vomiting. 60 tablet 0  . ranitidine (ZANTAC) 300 MG tablet Take 300 mg by mouth at bedtime.  3  . senna-docusate (SENOKOT-S) 8.6-50 MG per tablet Take 1 tablet by mouth at bedtime.     . Vitamin D, Ergocalciferol, (DRISDOL) 50000 UNITS CAPS capsule Take 50,000 Units by mouth once a week. Every Sunday  3  . XOLAIR 150 MG injection Inject 150 mg into the skin every 28 (twenty-eight) days.     . zafirlukast (ACCOLATE) 20 MG tablet Take 20 mg by mouth 2 (two) times daily.  3   No current facility-administered medications for this visit.    PHYSICAL EXAMINATION: ECOG PERFORMANCE STATUS: 2 - Symptomatic, <50% confined to bed  Filed Vitals:   01/19/15 1541  BP: 128/45  Pulse: 65  Temp: 98.5 F (36.9 C)  Resp: 18   Filed Weights   01/19/15 1541  Weight: 174 lb 8 oz (79.153 kg)    GENERAL:alert, no distress and comfortable SKIN:  Her skin is thin. The area over the port is a little worse compared with last week with a scab over the port area. EYES: normal, Conjunctiva are pink and non-injected, sclera clear OROPHARYNX:no exudate, no erythema and lips, buccal mucosa, and tongue normal  Musculoskeletal:no cyanosis of digits and no clubbing  NEURO: alert & oriented x 3 with fluent speech, no focal motor/sensory deficits  LABORATORY DATA:  I have reviewed the data as listed    Component Value Date/Time   NA 138 01/05/2015 1313   NA 136 11/26/2014 0550   K 4.5 01/05/2015 1313   K 3.7 11/26/2014 0550   CL 102 11/26/2014 0550   CO2 24 01/05/2015 1313   CO2 26 11/26/2014 0550   GLUCOSE 107 01/05/2015 1313    GLUCOSE 96 11/26/2014 0550   BUN 28.7* 01/05/2015 1313   BUN 21* 11/26/2014 0550   CREATININE 1.0 01/05/2015 1313   CREATININE 1.12* 11/26/2014 0550   CALCIUM 9.7 01/05/2015 1313   CALCIUM 9.1 11/26/2014 0550   PROT 5.6* 01/05/2015 1313   PROT 4.8* 11/22/2014 0513   ALBUMIN 3.5 01/05/2015 1313   ALBUMIN 3.0* 11/22/2014 0513   AST 21 01/05/2015 1313   AST 21 11/22/2014 0513   ALT 27 01/05/2015 1313   ALT 19 11/22/2014 0513   ALKPHOS 47 01/05/2015 1313   ALKPHOS 36* 11/22/2014 0513   BILITOT 0.80 01/05/2015 1313   BILITOT 1.6* 11/22/2014 0513   GFRNONAA 46* 11/26/2014 0550   GFRAA 53* 11/26/2014 0550    No results found for: SPEP, UPEP  Lab Results  Component Value Date   WBC 4.6 01/19/2015   NEUTROABS 3.0 01/19/2015   HGB 8.1* 01/19/2015   HCT 24.6* 01/19/2015   MCV 93.2 01/19/2015   PLT 199 01/19/2015      Chemistry      Component Value Date/Time   NA 138 01/05/2015 1313   NA 136 11/26/2014 0550   K 4.5 01/05/2015 1313   K 3.7 11/26/2014 0550   CL 102 11/26/2014 0550   CO2 24 01/05/2015 1313   CO2 26 11/26/2014 0550   BUN 28.7* 01/05/2015 1313   BUN 21* 11/26/2014 0550   CREATININE 1.0 01/05/2015 1313   CREATININE 1.12* 11/26/2014 0550      Component Value Date/Time   CALCIUM 9.7 01/05/2015 1313   CALCIUM 9.1 11/26/2014 0550   ALKPHOS 47 01/05/2015 1313   ALKPHOS 36* 11/22/2014 0513   AST 21 01/05/2015 1313   AST 21 11/22/2014 0513   ALT 27 01/05/2015 1313   ALT 19 11/22/2014 0513   BILITOT 0.80 01/05/2015 1313   BILITOT 1.6* 11/22/2014 0513      ASSESSMENT & PLAN:  Skin lesion She has worsening skin erosion over the port area. It has intermittently bled over the weekend. I placed a dry dressing over it. Her skin healing is poor due to chronic prednisone therapy. I would not recommend using the port and discussed whether we should remove the port. I am concerned that even with port removal the skin over it might not heal. The patient has a very  limited lifespan at this point in time due to her pacemaker at the end of battery life and I recommend just leaving the port alone with plan to  continue conservative management for now  Anemia in chronic illness The absence of significant reticulocytosis in the presence of recent GI bleed suggested component of anemia of chronic disease. Recent serum vitamin B-12 and iron studies were adequate. Serum erythropoietin level is low I discussed with her the risks and benefits of Aranesp injection I will start with inj every 2 weeks X 4 and reassess Goal is to keep hemoglobin > 10 She will need blood transfusion in 2 days. I will give HER 2 units of blood on Wednesday. She will return next week for Aranesp injection.   No orders of the defined types were placed in this encounter.   All questions were answered. The patient knows to call the clinic with any problems, questions or concerns. No barriers to learning was detected. I spent 25 minutes counseling the patient face to face. The total time spent in the appointment was 30 minutes and more than 50% was on counseling and review of test results     [name], [place], MD 01/20/2015 10:29 AM

## 2015-01-20 NOTE — Assessment & Plan Note (Signed)
She has worsening skin erosion over the port area. It has intermittently bled over the weekend. I placed a dry dressing over it. Her skin healing is poor due to chronic prednisone therapy. I would not recommend using the port and discussed whether we should remove the port. I am concerned that even with port removal the skin over it might not heal. The patient has a very limited lifespan at this point in time due to her pacemaker at the end of battery life and I recommend just leaving the port alone with plan to continue conservative management for now

## 2015-01-20 NOTE — Assessment & Plan Note (Signed)
The absence of significant reticulocytosis in the presence of recent GI bleed suggested component of anemia of chronic disease. Recent serum vitamin B-12 and iron studies were adequate. Serum erythropoietin level is low I discussed with her the risks and benefits of Aranesp injection I will start with inj every 2 weeks X 4 and reassess Goal is to keep hemoglobin > 10 She will need blood transfusion in 2 days. I will give HER 2 units of blood on Wednesday. She will return next week for Aranesp injection.

## 2015-01-20 NOTE — Telephone Encounter (Signed)
s.w. pt and advised on added appt.Marland KitchenMarland KitchenMarland KitchenMarland Kitchenpt ok and aware she will pick up new sched tomorrow at nxt visit

## 2015-01-21 ENCOUNTER — Ambulatory Visit: Payer: Medicare Other

## 2015-01-21 ENCOUNTER — Other Ambulatory Visit: Payer: Self-pay

## 2015-01-21 ENCOUNTER — Ambulatory Visit (HOSPITAL_BASED_OUTPATIENT_CLINIC_OR_DEPARTMENT_OTHER): Payer: Medicare Other

## 2015-01-21 ENCOUNTER — Other Ambulatory Visit: Payer: Self-pay | Admitting: *Deleted

## 2015-01-21 ENCOUNTER — Other Ambulatory Visit (HOSPITAL_BASED_OUTPATIENT_CLINIC_OR_DEPARTMENT_OTHER): Payer: Medicare Other

## 2015-01-21 VITALS — BP 112/56 | HR 65 | Temp 98.9°F | Resp 18

## 2015-01-21 DIAGNOSIS — D638 Anemia in other chronic diseases classified elsewhere: Secondary | ICD-10-CM

## 2015-01-21 DIAGNOSIS — N189 Chronic kidney disease, unspecified: Secondary | ICD-10-CM | POA: Diagnosis not present

## 2015-01-21 DIAGNOSIS — D631 Anemia in chronic kidney disease: Secondary | ICD-10-CM

## 2015-01-21 DIAGNOSIS — D509 Iron deficiency anemia, unspecified: Secondary | ICD-10-CM

## 2015-01-21 LAB — CBC & DIFF AND RETIC
BASO%: 0.2 % (ref 0.0–2.0)
BASOS ABS: 0 10*3/uL (ref 0.0–0.1)
EOS%: 1.7 % (ref 0.0–7.0)
Eosinophils Absolute: 0.1 10*3/uL (ref 0.0–0.5)
HCT: 25.3 % — ABNORMAL LOW (ref 34.8–46.6)
HEMOGLOBIN: 8.1 g/dL — AB (ref 11.6–15.9)
Immature Retic Fract: 7.6 % (ref 1.60–10.00)
LYMPH%: 35 % (ref 14.0–49.7)
MCH: 30.2 pg (ref 25.1–34.0)
MCHC: 32 g/dL (ref 31.5–36.0)
MCV: 94.4 fL (ref 79.5–101.0)
MONO#: 0.4 10*3/uL (ref 0.1–0.9)
MONO%: 7.7 % (ref 0.0–14.0)
NEUT%: 55.4 % (ref 38.4–76.8)
NEUTROS ABS: 2.7 10*3/uL (ref 1.5–6.5)
PLATELETS: 142 10*3/uL — AB (ref 145–400)
RBC: 2.68 10*6/uL — ABNORMAL LOW (ref 3.70–5.45)
RDW: 17.1 % — ABNORMAL HIGH (ref 11.2–14.5)
Retic %: 2.41 % — ABNORMAL HIGH (ref 0.70–2.10)
Retic Ct Abs: 64.59 10*3/uL (ref 33.70–90.70)
WBC: 4.8 10*3/uL (ref 3.9–10.3)
lymph#: 1.7 10*3/uL (ref 0.9–3.3)

## 2015-01-21 LAB — PREPARE RBC (CROSSMATCH)

## 2015-01-21 LAB — TECHNOLOGIST REVIEW

## 2015-01-21 LAB — HOLD TUBE, BLOOD BANK

## 2015-01-21 MED ORDER — SODIUM CHLORIDE 0.9 % IV SOLN
250.0000 mL | Freq: Once | INTRAVENOUS | Status: AC
  Administered 2015-01-21: 250 mL via INTRAVENOUS

## 2015-01-21 MED ORDER — ACETAMINOPHEN 325 MG PO TABS
ORAL_TABLET | ORAL | Status: AC
  Filled 2015-01-21: qty 2

## 2015-01-21 MED ORDER — DIPHENHYDRAMINE HCL 25 MG PO CAPS
ORAL_CAPSULE | ORAL | Status: AC
  Filled 2015-01-21: qty 1

## 2015-01-21 MED ORDER — DIPHENHYDRAMINE HCL 25 MG PO CAPS
25.0000 mg | ORAL_CAPSULE | Freq: Once | ORAL | Status: AC
  Administered 2015-01-21: 25 mg via ORAL

## 2015-01-21 MED ORDER — FUROSEMIDE 10 MG/ML IJ SOLN: 20.0000 mg | Freq: Once | INTRAMUSCULAR | Status: DC

## 2015-01-21 MED ORDER — ACETAMINOPHEN 325 MG PO TABS
650.0000 mg | ORAL_TABLET | Freq: Once | ORAL | Status: AC
  Administered 2015-01-21: 650 mg via ORAL

## 2015-01-21 NOTE — Patient Instructions (Signed)

## 2015-01-22 LAB — TYPE AND SCREEN
ABO/RH(D): A POS
Antibody Screen: NEGATIVE
Unit division: 0
Unit division: 0

## 2015-01-28 ENCOUNTER — Other Ambulatory Visit (HOSPITAL_BASED_OUTPATIENT_CLINIC_OR_DEPARTMENT_OTHER): Payer: Medicare Other

## 2015-01-28 ENCOUNTER — Ambulatory Visit: Payer: Medicare Other

## 2015-01-28 ENCOUNTER — Ambulatory Visit: Payer: Self-pay | Admitting: Hematology and Oncology

## 2015-01-28 ENCOUNTER — Telehealth: Payer: Self-pay | Admitting: Hematology and Oncology

## 2015-01-28 ENCOUNTER — Ambulatory Visit: Payer: Self-pay

## 2015-01-28 ENCOUNTER — Other Ambulatory Visit: Payer: Self-pay

## 2015-01-28 ENCOUNTER — Ambulatory Visit (HOSPITAL_BASED_OUTPATIENT_CLINIC_OR_DEPARTMENT_OTHER): Payer: Medicare Other | Admitting: Hematology and Oncology

## 2015-01-28 ENCOUNTER — Encounter: Payer: Self-pay | Admitting: Hematology and Oncology

## 2015-01-28 VITALS — BP 126/57 | HR 65 | Temp 99.7°F | Resp 18 | Ht 64.5 in | Wt 174.1 lb

## 2015-01-28 DIAGNOSIS — R600 Localized edema: Secondary | ICD-10-CM | POA: Diagnosis not present

## 2015-01-28 DIAGNOSIS — D638 Anemia in other chronic diseases classified elsewhere: Secondary | ICD-10-CM

## 2015-01-28 DIAGNOSIS — D509 Iron deficiency anemia, unspecified: Secondary | ICD-10-CM

## 2015-01-28 DIAGNOSIS — L989 Disorder of the skin and subcutaneous tissue, unspecified: Secondary | ICD-10-CM | POA: Diagnosis not present

## 2015-01-28 DIAGNOSIS — D518 Other vitamin B12 deficiency anemias: Secondary | ICD-10-CM

## 2015-01-28 LAB — CBC & DIFF AND RETIC
BASO%: 0.3 % (ref 0.0–2.0)
Basophils Absolute: 0 10*3/uL (ref 0.0–0.1)
EOS%: 2.1 % (ref 0.0–7.0)
Eosinophils Absolute: 0.1 10*3/uL (ref 0.0–0.5)
HCT: 31.4 % — ABNORMAL LOW (ref 34.8–46.6)
HGB: 10.2 g/dL — ABNORMAL LOW (ref 11.6–15.9)
IMMATURE RETIC FRACT: 9.7 % (ref 1.60–10.00)
LYMPH%: 29.3 % (ref 14.0–49.7)
MCH: 29.8 pg (ref 25.1–34.0)
MCHC: 32.5 g/dL (ref 31.5–36.0)
MCV: 91.8 fL (ref 79.5–101.0)
MONO#: 0.3 10*3/uL (ref 0.1–0.9)
MONO%: 8.9 % (ref 0.0–14.0)
NEUT#: 2.3 10*3/uL (ref 1.5–6.5)
NEUT%: 59.4 % (ref 38.4–76.8)
PLATELETS: 80 10*3/uL — AB (ref 145–400)
RBC: 3.42 10*6/uL — AB (ref 3.70–5.45)
RDW: 16.5 % — ABNORMAL HIGH (ref 11.2–14.5)
RETIC CT ABS: 56.09 10*3/uL (ref 33.70–90.70)
Retic %: 1.64 % (ref 0.70–2.10)
WBC: 3.8 10*3/uL — AB (ref 3.9–10.3)
lymph#: 1.1 10*3/uL (ref 0.9–3.3)

## 2015-01-28 LAB — HOLD TUBE, BLOOD BANK

## 2015-01-28 LAB — TECHNOLOGIST REVIEW

## 2015-01-28 MED ORDER — DARBEPOETIN ALFA 300 MCG/0.6ML IJ SOSY
300.0000 ug | PREFILLED_SYRINGE | Freq: Once | INTRAMUSCULAR | Status: DC
  Filled 2015-01-28: qty 0.6

## 2015-01-28 NOTE — Assessment & Plan Note (Signed)
She has intermittently edema which I think is due to poor circulation/reduced mobility. She will continue to take Lasix as needed

## 2015-01-28 NOTE — Telephone Encounter (Signed)
Spoke with patient and she is aware of her 8/31 visit   [name]

## 2015-01-28 NOTE — Assessment & Plan Note (Signed)
She has skin erosion over the port area. I am concerned that even with port removal the skin over it might not heal. The patient has a very limited lifespan at this point in time due to her pacemaker at the end of battery life and I recommend just leaving the port alone with plan to continue conservative management for now  she will continue dressing changes and we will not use her port for transfusion support.

## 2015-01-28 NOTE — Assessment & Plan Note (Signed)
The absence of significant reticulocytosis in the presence of recent GI bleed suggested component of anemia of chronic disease. Recent serum vitamin B-12 and iron studies were adequate. Serum erythropoietin level is low I discussed with her the risks and benefits of Aranesp injection I will start with inj every 2 weeks X 4 and reassess Goal is to keep hemoglobin > 10 or blood transfusion as needed to keep hemoglobin greater than 10.

## 2015-01-28 NOTE — Progress Notes (Signed)
[place] OFFICE PROGRESS NOTE  Patient Care Team: Prince Solian, MD as PCP - General (Internal Medicine) Gatha Mayer, MD as Consulting Physician (Gastroenterology) Evans Lance, MD as Consulting Physician (Cardiology) Collene Gobble, MD as Consulting Physician (Pulmonary Disease) Jiles Prows, MD as Attending Physician ([place]) Heath Lark, MD as Consulting Physician (Hematology and Oncology) Prince Solian, MD  SUMMARY OF ONCOLOGIC HISTORY:  This patient have remote history of breast cancer diagnosed from screening mammogram in 2003. She had surgery, radiation therapy and adjuvant tamoxifen. 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 She had received B12 injection for B12 deficiency and intravenous iron for iron deficiency anemia. The patient had history of allergic reaction to intravenous iron dextran. She received intravenous iron feraheme in November 8850 without complications. In May 2016, she was found to have melanoma and significant GI bleed. On 11/24/2014, EGD and small bowel endoscopy revealed gastritis, AVM and duodenal mass. Biopsy showed nonmalignant tubular adenoma.Accession: [id number] CT scan of the abdomen dated 11/13/2014 show no retroperitoneal or other acute hemorrhage. Descending colostomy with peristomal hernia containing nonobstructed bowel. Possible cirrhosis She have recurrent admission to the hospital for severe anemia. Her case was recently presented at GI tumor board  Repeat EGD on 12/25/2014 again showed malignant mass but biopsy was benign From 12/17/2014 to present, she received intermittent Aranesp injection and blood transfusion   INTERVAL HISTORY: Please see below for problem oriented charting.  she feels better after blood transfusion last week. She had better energy. She complained of intermittent left leg edema.  her skin over the port is  healing. The patient denies any recent signs or symptoms of bleeding such as spontaneous epistaxis, hematuria or hematochezia.   REVIEW OF SYSTEMS:   Constitutional: Denies fevers, chills or abnormal weight loss Eyes: Denies blurriness of vision Ears, nose, mouth, throat, and face: Denies mucositis or sore throat Respiratory: Denies cough, dyspnea or wheezes Cardiovascular: Denies palpitation, chest discomfort  Gastrointestinal:  Denies nausea, heartburn or change in bowel habits Skin: Denies abnormal skin rashes Lymphatics: Denies new lymphadenopathy or easy bruising Neurological:Denies numbness, tingling or new weaknesses Behavioral/Psych: Mood is stable, no new changes  All other systems were reviewed with the patient and are negative.  I have reviewed the past medical history, past surgical history, social history and family history with the patient and they are unchanged from previous note.  ALLERGIES:  is allergic to aspirin; sulfonamide derivatives; ibuprofen; iron dextran; sulfa antibiotics; tape; and metoclopramide hcl.  MEDICATIONS:  Current Outpatient Prescriptions  Medication Sig Dispense Refill  . acetaminophen (TYLENOL) 325 MG tablet Take 650 mg by mouth every 6 (six) hours as needed for mild pain.     [name] albuterol (PROVENTIL HFA;VENTOLIN HFA) 108 (90 BASE) MCG/ACT inhaler Inhale 2 puffs into the lungs 2 (two) times daily.    [name] albuterol (PROVENTIL) (2.5 MG/3ML) 0.083% nebulizer solution Take 2.5 mg by nebulization every 4 (four) hours as needed for wheezing or shortness of breath.    . ALPRAZolam (XANAX) 0.25 MG tablet Take 0.25 mg by mouth at bedtime.     . baclofen (LIORESAL) 10 MG tablet Take 10 mg by mouth every 8 (eight) hours as needed for muscle spasms.     . DEXILANT 60 MG capsule Take 60 mg by mouth daily before breakfast.  3  . diphenhydrAMINE (BENADRYL) 25 mg capsule Take 25 mg by mouth daily as needed for itching.     [name]  EPINEPHrine (EPIPEN IJ) Inject 1  application as directed as directed. AS NEEDED for anaphylaxis    . guaiFENesin-dextromethorphan (ROBITUSSIN DM) 100-10 MG/5ML syrup Take 15 mLs by mouth every 4 (four) hours as needed for cough.    . levothyroxine (SYNTHROID, LEVOTHROID) 50 MCG tablet Take 50 mcg by mouth daily.  3  . losartan (COZAAR) 50 MG tablet Take 50 mg by mouth daily.    . Multiple Vitamins-Minerals (MULTIVITAMIN & MINERAL PO) Take 1 tablet by mouth daily.    . pravastatin (PRAVACHOL) 40 MG tablet Take 40 mg by mouth at bedtime.   2  . ranitidine (ZANTAC) 300 MG tablet Take 300 mg by mouth at bedtime.  3  . senna-docusate (SENOKOT-S) 8.6-50 MG per tablet Take 1 tablet by mouth at bedtime.     . Vitamin D, Ergocalciferol, (DRISDOL) 50000 UNITS CAPS capsule Take 50,000 Units by mouth once a week. Every Sunday  3  . zafirlukast (ACCOLATE) 20 MG tablet Take 20 mg by mouth 2 (two) times daily.  3  . beclomethasone (QVAR) 80 MCG/ACT inhaler Inhale 3 puffs into the lungs 2 (two) times daily.     . budesonide (RHINOCORT AQUA) 32 MCG/ACT nasal spray Place 1 spray into both nostrils 2 (two) times daily.     . Calcium Carbonate-Vitamin D 600-400 MG-UNIT per tablet Take 1 tablet by mouth daily.    . Carboxymethylcellulose Sodium (LUBRICANT EYE DROPS OP) Apply 2 drops to eye 2 (two) times daily.     . cephALEXin (KEFLEX) 500 MG capsule Take 1 capsule (500 mg total) by mouth 2 (two) times daily. 14 capsule 0  . DULoxetine (CYMBALTA) 30 MG capsule Take 30 mg by mouth at bedtime.  3  . Eszopiclone 3 MG TABS Take 3 mg by mouth daily as needed (sleep).   2  . furosemide (LASIX) 40 MG tablet Take 40 mg by mouth daily as needed for fluid or edema.     . hydrocortisone cream 1 % Apply 1 application topically daily as needed for itching.    [name] ketoconazole (NIZORAL) 2 % cream Apply 1 application topically daily as needed. Apply to affected area (rash).  6  . lidocaine-prilocaine (EMLA) cream Apply 1 application topically as needed. Apply to  [facility] a cath one hour prior to needle stick. 30 g 3  . metoprolol succinate (TOPROL-XL) 50 MG 24 hr tablet Take 50 mg by mouth daily.  1  . metoprolol succinate (TOPROL-XL) 50 MG 24 hr tablet TAKE 1 TABLET BY MOUTH EVERY DAY 90 tablet 1  . mirtazapine (REMERON) 15 MG tablet Take 15 mg by mouth at bedtime.   3  . oxycodone (OXY-IR) 5 MG capsule Take 5 mg by mouth every 4 (four) hours as needed for pain.    . polyethylene glycol (MIRALAX / GLYCOLAX) packet Take 17 g by mouth daily as needed for mild constipation or moderate constipation.    . potassium chloride (MICRO-K) 10 MEQ CR capsule     . predniSONE (DELTASONE) 2.5 MG tablet Take 5 tablets by mouth daily before breakfast.    . promethazine (PHENERGAN) 25 MG tablet Take 1 tablet (25 mg total) by mouth every 6 (six) hours as needed for nausea or vomiting. 60 tablet 0  . XOLAIR 150 MG injection Inject 150 mg into the skin every 28 (twenty-eight) days.      No current facility-administered medications for this visit.    PHYSICAL EXAMINATION: ECOG PERFORMANCE STATUS: 2 - Symptomatic, <50% confined to bed  Filed Vitals:   01/28/15 1019  BP: 126/57  Pulse: 65  Temp: 99.7 F (37.6 C)  Resp: 18   Filed Weights   01/28/15 1019  Weight: 174 lb 1.6 oz (78.971 kg)    GENERAL:alert, no distress and comfortable SKIN:  The skin over the port site is healing. EYES: normal, Conjunctiva are pink and non-injected, sclera clear HEART: mild left lower extremity edema ABDOMEN:abdomen soft, non-tender and normal bowel sounds Musculoskeletal:no cyanosis of digits and no clubbing  NEURO: alert & oriented x 3 with fluent speech, no focal motor/sensory deficits  LABORATORY DATA:  I have reviewed the data as listed    Component Value Date/Time   NA 138 01/05/2015 1313   NA 136 11/26/2014 0550   K 4.5 01/05/2015 1313   K 3.7 11/26/2014 0550   CL 102 11/26/2014 0550   CO2 24 01/05/2015 1313   CO2 26 11/26/2014 0550   GLUCOSE 107 01/05/2015 1313    GLUCOSE 96 11/26/2014 0550   BUN 28.7* 01/05/2015 1313   BUN 21* 11/26/2014 0550   CREATININE 1.0 01/05/2015 1313   CREATININE 1.12* 11/26/2014 0550   CALCIUM 9.7 01/05/2015 1313   CALCIUM 9.1 11/26/2014 0550   PROT 5.6* 01/05/2015 1313   PROT 4.8* 11/22/2014 0513   ALBUMIN 3.5 01/05/2015 1313   ALBUMIN 3.0* 11/22/2014 0513   AST 21 01/05/2015 1313   AST 21 11/22/2014 0513   ALT 27 01/05/2015 1313   ALT 19 11/22/2014 0513   ALKPHOS 47 01/05/2015 1313   ALKPHOS 36* 11/22/2014 0513   BILITOT 0.80 01/05/2015 1313   BILITOT 1.6* 11/22/2014 0513   GFRNONAA 46* 11/26/2014 0550   GFRAA 53* 11/26/2014 0550    No results found for: SPEP, UPEP  Lab Results  Component Value Date   WBC 3.8* 01/28/2015   NEUTROABS 2.3 01/28/2015   HGB 10.2* 01/28/2015   HCT 31.4* 01/28/2015   MCV 91.8 01/28/2015   PLT 80* 01/28/2015      Chemistry      Component Value Date/Time   NA 138 01/05/2015 1313   NA 136 11/26/2014 0550   K 4.5 01/05/2015 1313   K 3.7 11/26/2014 0550   CL 102 11/26/2014 0550   CO2 24 01/05/2015 1313   CO2 26 11/26/2014 0550   BUN 28.7* 01/05/2015 1313   BUN 21* 11/26/2014 0550   CREATININE 1.0 01/05/2015 1313   CREATININE 1.12* 11/26/2014 0550      Component Value Date/Time   CALCIUM 9.7 01/05/2015 1313   CALCIUM 9.1 11/26/2014 0550   ALKPHOS 47 01/05/2015 1313   ALKPHOS 36* 11/22/2014 0513   AST 21 01/05/2015 1313   AST 21 11/22/2014 0513   ALT 27 01/05/2015 1313   ALT 19 11/22/2014 0513   BILITOT 0.80 01/05/2015 1313   BILITOT 1.6* 11/22/2014 0513     ASSESSMENT & PLAN:  Anemia in chronic illness The absence of significant reticulocytosis in the presence of recent GI bleed suggested component of anemia of chronic disease. Recent serum vitamin B-12 and iron studies were adequate. Serum erythropoietin level is low I discussed with her the risks and benefits of Aranesp injection I will start with inj every 2 weeks X 4 and reassess Goal is to keep  hemoglobin > 10 or blood transfusion as needed to keep hemoglobin greater than 10.  Skin lesion She has skin erosion over the port area. I am concerned that even with port removal the skin over it might not heal. The patient has  a very limited lifespan at this point in time due to her pacemaker at the end of battery life and I recommend just leaving the port alone with plan to continue conservative management for now  she will continue dressing changes and we will not use her port for transfusion support.    Leg edema, left  She has intermittently edema which I think is due to poor circulation/reduced mobility. She will continue to take Lasix as needed   No orders of the defined types were placed in this encounter.   All questions were answered. The patient knows to call the clinic with any problems, questions or concerns. No barriers to learning was detected. I spent 15 minutes counseling the patient face to face. The total time spent in the appointment was 20 minutes and more than 50% was on counseling and review of test results     [facility], Kaser, MD 01/28/2015 10:58 AM

## 2015-02-04 ENCOUNTER — Telehealth: Payer: Self-pay | Admitting: *Deleted

## 2015-02-04 ENCOUNTER — Other Ambulatory Visit: Payer: Self-pay | Admitting: Hematology and Oncology

## 2015-02-04 ENCOUNTER — Other Ambulatory Visit (HOSPITAL_BASED_OUTPATIENT_CLINIC_OR_DEPARTMENT_OTHER): Payer: Medicare Other

## 2015-02-04 ENCOUNTER — Ambulatory Visit (HOSPITAL_BASED_OUTPATIENT_CLINIC_OR_DEPARTMENT_OTHER): Payer: Medicare Other

## 2015-02-04 ENCOUNTER — Telehealth: Payer: Self-pay

## 2015-02-04 DIAGNOSIS — D509 Iron deficiency anemia, unspecified: Secondary | ICD-10-CM

## 2015-02-04 DIAGNOSIS — D518 Other vitamin B12 deficiency anemias: Secondary | ICD-10-CM

## 2015-02-04 DIAGNOSIS — D638 Anemia in other chronic diseases classified elsewhere: Secondary | ICD-10-CM

## 2015-02-04 LAB — CBC & DIFF AND RETIC
BASO%: 0 % (ref 0.0–2.0)
Basophils Absolute: 0 10*3/uL (ref 0.0–0.1)
EOS%: 1.2 % (ref 0.0–7.0)
Eosinophils Absolute: 0.1 10*3/uL (ref 0.0–0.5)
HCT: 26.5 % — ABNORMAL LOW (ref 34.8–46.6)
HGB: 8.5 g/dL — ABNORMAL LOW (ref 11.6–15.9)
IMMATURE RETIC FRACT: 7.1 % (ref 1.60–10.00)
LYMPH#: 1.3 10*3/uL (ref 0.9–3.3)
LYMPH%: 31.1 % (ref 14.0–49.7)
MCH: 29.1 pg (ref 25.1–34.0)
MCHC: 32.1 g/dL (ref 31.5–36.0)
MCV: 90.8 fL (ref 79.5–101.0)
MONO#: 0.3 10*3/uL (ref 0.1–0.9)
MONO%: 7.7 % (ref 0.0–14.0)
NEUT%: 60 % (ref 38.4–76.8)
NEUTROS ABS: 2.4 10*3/uL (ref 1.5–6.5)
Platelets: 102 10*3/uL — ABNORMAL LOW (ref 145–400)
RBC: 2.92 10*6/uL — AB (ref 3.70–5.45)
RDW: 16.4 % — ABNORMAL HIGH (ref 11.2–14.5)
Retic %: 1.22 % (ref 0.70–2.10)
Retic Ct Abs: 35.62 10*3/uL (ref 33.70–90.70)
WBC: 4.1 10*3/uL (ref 3.9–10.3)

## 2015-02-04 LAB — TECHNOLOGIST REVIEW

## 2015-02-04 LAB — HOLD TUBE, BLOOD BANK

## 2015-02-04 MED ORDER — DARBEPOETIN ALFA 300 MCG/0.6ML IJ SOSY
300.0000 ug | PREFILLED_SYRINGE | Freq: Once | INTRAMUSCULAR | Status: AC
  Administered 2015-02-04: 300 ug via SUBCUTANEOUS
  Filled 2015-02-04: qty 0.6

## 2015-02-04 NOTE — Patient Instructions (Signed)
Darbepoetin Alfa injection What is this medicine? DARBEPOETIN ALFA (dar be POE e tin AL fa) helps your body make more red blood cells. It is used to treat anemia caused by chronic kidney failure and chemotherapy. This medicine may be used for other purposes; ask your health care provider or pharmacist if you have questions. COMMON BRAND NAME(S): Aranesp What should I tell my health care provider before I take this medicine? They need to know if you have any of these conditions: -blood clotting disorders or history of blood clots -cancer patient not on chemotherapy -cystic fibrosis -heart disease, such as angina, heart failure, or a history of a heart attack -hemoglobin level of 12 g/dL or greater -high blood pressure -low levels of folate, iron, or vitamin B12 -seizures -an unusual or allergic reaction to darbepoetin, erythropoietin, albumin, hamster proteins, latex, other medicines, foods, dyes, or preservatives -pregnant or trying to get pregnant -breast-feeding How should I use this medicine? This medicine is for injection into a vein or under the skin. It is usually given by a health care professional in a hospital or clinic setting. If you get this medicine at home, you will be taught how to prepare and give this medicine. Do not shake the solution before you withdraw a dose. Use exactly as directed. Take your medicine at regular intervals. Do not take your medicine more often than directed. It is important that you put your used needles and syringes in a special sharps container. Do not put them in a trash can. If you do not have a sharps container, call your pharmacist or healthcare provider to get one. Talk to your pediatrician regarding the use of this medicine in children. While this medicine may be used in children as young as 1 year for selected conditions, precautions do apply. Overdosage: If you think you have taken too much of this medicine contact a poison control center or  emergency room at once. NOTE: This medicine is only for you. Do not share this medicine with others. What if I miss a dose? If you miss a dose, take it as soon as you can. If it is almost time for your next dose, take only that dose. Do not take double or extra doses. What may interact with this medicine? Do not take this medicine with any of the following medications: -epoetin alfa This list may not describe all possible interactions. Give your health care provider a list of all the medicines, herbs, non-prescription drugs, or dietary supplements you use. Also tell them if you smoke, drink alcohol, or use illegal drugs. Some items may interact with your medicine. What should I watch for while using this medicine? Visit your prescriber or health care professional for regular checks on your progress and for the needed blood tests and blood pressure measurements. It is especially important for the doctor to make sure your hemoglobin level is in the desired range, to limit the risk of potential side effects and to give you the best benefit. Keep all appointments for any recommended tests. Check your blood pressure as directed. Ask your doctor what your blood pressure should be and when you should contact him or her. As your body makes more red blood cells, you may need to take iron, folic acid, or vitamin B supplements. Ask your doctor or health care provider which products are right for you. If you have kidney disease continue dietary restrictions, even though this medication can make you feel better. Talk with your doctor or health   care professional about the foods you eat and the vitamins that you take. What side effects may I notice from receiving this medicine? Side effects that you should report to your doctor or health care professional as soon as possible: -allergic reactions like skin rash, itching or hives, swelling of the face, lips, or tongue -breathing problems -changes in vision -chest  pain -confusion, trouble speaking or understanding -feeling faint or lightheaded, falls -high blood pressure -muscle aches or pains -pain, swelling, warmth in the leg -rapid weight gain -severe headaches -sudden numbness or weakness of the face, arm or leg -trouble walking, dizziness, loss of balance or coordination -seizures (convulsions) -swelling of the ankles, feet, hands -unusually weak or tired Side effects that usually do not require medical attention (report to your doctor or health care professional if they continue or are bothersome): -diarrhea -fever, chills (flu-like symptoms) -headaches -nausea, vomiting -redness, stinging, or swelling at site where injected This list may not describe all possible side effects. Call your doctor for medical advice about side effects. You may report side effects to FDA at 1-800-FDA-1088. Where should I keep my medicine? Keep out of the reach of children. Store in a refrigerator between 2 and 8 degrees C (36 and 46 degrees F). Do not freeze. Do not shake. Throw away any unused portion if using a single-dose vial. Throw away any unused medicine after the expiration date. NOTE: This sheet is a summary. It may not cover all possible information. If you have questions about this medicine, talk to your doctor, pharmacist, or health care provider.  2015, Elsevier/Gold Standard. (2008-05-20 10:23:57)  

## 2015-02-04 NOTE — Progress Notes (Signed)
Per Dr. Alvy Bimler, pt does not require transfusion today and only needs Aranesp injection.  Pt will return in 1 week per her request; MD notified and order entered to set her up next Wed. 8/24 for labs and possible transfusion.  Pt made aware of plan and is in agreement.  No further questions or concerns.  Pt verbalized understanding to notify us if she has any new symptoms or questions/concerns.

## 2015-02-04 NOTE — Telephone Encounter (Signed)
Per staff message and POF I have scheduled appts. Advised scheduler of appts. JMW  

## 2015-02-04 NOTE — Telephone Encounter (Signed)
Tammi, RN requested this nurse to observe pt port site when pt came into lab today. Pt states her nurse had noticed another hematoma a few days ago, and now has "2 big mounds of scabs" Observed port site, noticed 2 scabs directly over port, area is clean, cry with no active bleeding, no redness and no pain with palpation. Pt denies any other instances of bleeding since she was here last and spoke with Dr. Alvy Bimler. Pt has dry dressing on area, and requested to change it every other day instead of daily. Informed pt it is important to keep a clean dry dressing on it daily due to risk of infection. Pt verbalized understanding and denies any further questions or concerns at this time. Informed Tammi, RN of observation.

## 2015-02-09 ENCOUNTER — Telehealth: Payer: Self-pay | Admitting: Hematology and Oncology

## 2015-02-09 NOTE — Telephone Encounter (Signed)
pt dtr called to confirm appt.....she is aware of 11:30am arrival

## 2015-02-10 ENCOUNTER — Telehealth: Payer: Self-pay | Admitting: *Deleted

## 2015-02-10 ENCOUNTER — Telehealth: Payer: Self-pay | Admitting: Internal Medicine

## 2015-02-10 NOTE — Telephone Encounter (Signed)
Patient called regarding her appts for tomorrow. I have called her back and gave appt time/date

## 2015-02-10 NOTE — Telephone Encounter (Signed)
Transmission reviewed- called tech services. Pt made aware that pacemaker has been ERI since 12-01-14 and [employer] cannot guarantee pacing for more than 3 months after ERI alert. Discussed that we cannot be sure when the pacemaker will not function any longer. She is appreciative of call.

## 2015-02-10 NOTE — Telephone Encounter (Signed)
Spoke with patient and she is going to send a remote today to check her battery life.  Terrence Dupont is on the look out for it and will  Call her back

## 2015-02-10 NOTE — Telephone Encounter (Signed)
New message     Talk to [facility].  Pt would not tell me what she wanted

## 2015-02-11 ENCOUNTER — Ambulatory Visit (HOSPITAL_BASED_OUTPATIENT_CLINIC_OR_DEPARTMENT_OTHER): Payer: Medicare Other

## 2015-02-11 ENCOUNTER — Other Ambulatory Visit: Payer: Self-pay | Admitting: Hematology and Oncology

## 2015-02-11 ENCOUNTER — Other Ambulatory Visit: Payer: Self-pay | Admitting: *Deleted

## 2015-02-11 ENCOUNTER — Other Ambulatory Visit (HOSPITAL_BASED_OUTPATIENT_CLINIC_OR_DEPARTMENT_OTHER): Payer: Medicare Other

## 2015-02-11 ENCOUNTER — Ambulatory Visit: Payer: Self-pay

## 2015-02-11 ENCOUNTER — Other Ambulatory Visit: Payer: Self-pay

## 2015-02-11 VITALS — BP 119/48 | HR 64 | Temp 99.7°F | Resp 17

## 2015-02-11 DIAGNOSIS — D509 Iron deficiency anemia, unspecified: Secondary | ICD-10-CM

## 2015-02-11 DIAGNOSIS — D518 Other vitamin B12 deficiency anemias: Secondary | ICD-10-CM

## 2015-02-11 DIAGNOSIS — D630 Anemia in neoplastic disease: Secondary | ICD-10-CM

## 2015-02-11 DIAGNOSIS — C50919 Malignant neoplasm of unspecified site of unspecified female breast: Secondary | ICD-10-CM

## 2015-02-11 DIAGNOSIS — D638 Anemia in other chronic diseases classified elsewhere: Secondary | ICD-10-CM | POA: Diagnosis present

## 2015-02-11 DIAGNOSIS — N189 Chronic kidney disease, unspecified: Secondary | ICD-10-CM | POA: Diagnosis not present

## 2015-02-11 LAB — TECHNOLOGIST REVIEW: Technologist Review: 7

## 2015-02-11 LAB — CBC WITH DIFFERENTIAL/PLATELET
BASO%: 0.4 % (ref 0.0–2.0)
Basophils Absolute: 0 10*3/uL (ref 0.0–0.1)
EOS ABS: 0.1 10*3/uL (ref 0.0–0.5)
EOS%: 2 % (ref 0.0–7.0)
HCT: 24.2 % — ABNORMAL LOW (ref 34.8–46.6)
HGB: 7.9 g/dL — ABNORMAL LOW (ref 11.6–15.9)
LYMPH%: 35 % (ref 14.0–49.7)
MCH: 29.4 pg (ref 25.1–34.0)
MCHC: 32.6 g/dL (ref 31.5–36.0)
MCV: 90 fL (ref 79.5–101.0)
MONO#: 0.4 10*3/uL (ref 0.1–0.9)
MONO%: 7.8 % (ref 0.0–14.0)
NEUT#: 3 10*3/uL (ref 1.5–6.5)
NEUT%: 54.8 % (ref 38.4–76.8)
PLATELETS: 156 10*3/uL (ref 145–400)
RBC: 2.69 10*6/uL — AB (ref 3.70–5.45)
RDW: 16.5 % — ABNORMAL HIGH (ref 11.2–14.5)
WBC: 5.5 10*3/uL (ref 3.9–10.3)
lymph#: 1.9 10*3/uL (ref 0.9–3.3)
nRBC: 1 % — ABNORMAL HIGH (ref 0–0)

## 2015-02-11 LAB — HOLD TUBE, BLOOD BANK

## 2015-02-11 LAB — PREPARE RBC (CROSSMATCH)

## 2015-02-11 MED ORDER — FUROSEMIDE 10 MG/ML IJ SOLN: 20.0000 mg | Freq: Once | INTRAMUSCULAR | Status: DC

## 2015-02-11 MED ORDER — SODIUM CHLORIDE 0.9 % IV SOLN
250.0000 mL | Freq: Once | INTRAVENOUS | Status: AC
  Administered 2015-02-11: 250 mL via INTRAVENOUS

## 2015-02-11 MED ORDER — ACETAMINOPHEN 325 MG PO TABS
ORAL_TABLET | ORAL | Status: AC
  Filled 2015-02-11: qty 2

## 2015-02-11 MED ORDER — DARBEPOETIN ALFA 300 MCG/0.6ML IJ SOSY
300.0000 ug | PREFILLED_SYRINGE | Freq: Once | INTRAMUSCULAR | Status: DC
  Filled 2015-02-11: qty 0.6

## 2015-02-11 MED ORDER — DIPHENHYDRAMINE HCL 25 MG PO CAPS
ORAL_CAPSULE | ORAL | Status: AC
  Filled 2015-02-11: qty 1

## 2015-02-11 MED ORDER — DIPHENHYDRAMINE HCL 25 MG PO CAPS
25.0000 mg | ORAL_CAPSULE | Freq: Once | ORAL | Status: AC
  Administered 2015-02-11: 25 mg via ORAL

## 2015-02-11 MED ORDER — ACETAMINOPHEN 325 MG PO TABS
650.0000 mg | ORAL_TABLET | Freq: Once | ORAL | Status: AC
  Administered 2015-02-11: 650 mg via ORAL

## 2015-02-11 NOTE — Patient Instructions (Signed)

## 2015-02-12 LAB — TYPE AND SCREEN
ABO/RH(D): A POS
ANTIBODY SCREEN: NEGATIVE
UNIT DIVISION: 0
UNIT DIVISION: 0

## 2015-02-16 ENCOUNTER — Telehealth: Payer: Self-pay | Admitting: Hematology and Oncology

## 2015-02-16 NOTE — Addendum Note (Signed)
Addended by: Adalberto Cole on: 02/16/2015 04:35 PM   Modules accepted: Orders

## 2015-02-16 NOTE — Telephone Encounter (Signed)
s.w. pt and advised on Aug appt....pt ok and aware °

## 2015-02-18 ENCOUNTER — Other Ambulatory Visit (HOSPITAL_BASED_OUTPATIENT_CLINIC_OR_DEPARTMENT_OTHER): Payer: Medicare Other

## 2015-02-18 ENCOUNTER — Encounter: Payer: Self-pay | Admitting: Hematology and Oncology

## 2015-02-18 ENCOUNTER — Ambulatory Visit (HOSPITAL_BASED_OUTPATIENT_CLINIC_OR_DEPARTMENT_OTHER): Payer: Medicare Other

## 2015-02-18 ENCOUNTER — Telehealth: Payer: Self-pay | Admitting: Hematology and Oncology

## 2015-02-18 ENCOUNTER — Ambulatory Visit (HOSPITAL_BASED_OUTPATIENT_CLINIC_OR_DEPARTMENT_OTHER): Payer: Medicare Other | Admitting: Hematology and Oncology

## 2015-02-18 VITALS — BP 138/60 | HR 65 | Temp 98.8°F | Resp 17 | Ht 64.5 in | Wt 170.8 lb

## 2015-02-18 DIAGNOSIS — K3189 Other diseases of stomach and duodenum: Secondary | ICD-10-CM | POA: Diagnosis present

## 2015-02-18 DIAGNOSIS — D509 Iron deficiency anemia, unspecified: Secondary | ICD-10-CM

## 2015-02-18 DIAGNOSIS — K319 Disease of stomach and duodenum, unspecified: Secondary | ICD-10-CM

## 2015-02-18 DIAGNOSIS — D638 Anemia in other chronic diseases classified elsewhere: Secondary | ICD-10-CM

## 2015-02-18 DIAGNOSIS — L989 Disorder of the skin and subcutaneous tissue, unspecified: Secondary | ICD-10-CM | POA: Diagnosis not present

## 2015-02-18 DIAGNOSIS — C50919 Malignant neoplasm of unspecified site of unspecified female breast: Secondary | ICD-10-CM

## 2015-02-18 DIAGNOSIS — D518 Other vitamin B12 deficiency anemias: Secondary | ICD-10-CM

## 2015-02-18 LAB — HOLD TUBE, BLOOD BANK

## 2015-02-18 LAB — MANUAL DIFFERENTIAL
ALC: 1.5 10*3/uL (ref 0.9–3.3)
ANC (CHCC manual diff): 2.6 10*3/uL (ref 1.5–6.5)
BAND NEUTROPHILS: 8 % (ref 0–10)
BLASTS: 5 % — AB (ref 0–0)
Basophil: 0 % (ref 0–2)
EOS%: 1 % (ref 0–7)
LYMPH: 33 % (ref 14–49)
MONO: 1 % (ref 0–14)
MYELOCYTES: 9 % — AB (ref 0–0)
Metamyelocytes: 9 % — ABNORMAL HIGH (ref 0–0)
OTHER CELL: 0 % (ref 0–0)
PLT EST: DECREASED
PROMYELO: 0 % (ref 0–0)
SEG: 34 % — AB (ref 38–77)
Variant Lymph: 0 % (ref 0–0)
nRBC: 1 % — ABNORMAL HIGH (ref 0–0)

## 2015-02-18 LAB — CBC WITH DIFFERENTIAL/PLATELET
HCT: 28.3 % — ABNORMAL LOW (ref 34.8–46.6)
HGB: 9.5 g/dL — ABNORMAL LOW (ref 11.6–15.9)
MCH: 30 pg (ref 25.1–34.0)
MCHC: 33.5 g/dL (ref 31.5–36.0)
MCV: 89.5 fL (ref 79.5–101.0)
Platelets: 85 10*3/uL — ABNORMAL LOW (ref 145–400)
RBC: 3.17 10*6/uL — AB (ref 3.70–5.45)
RDW: 16.4 % — AB (ref 11.2–14.5)
WBC: 4.4 10*3/uL (ref 3.9–10.3)

## 2015-02-18 MED ORDER — DARBEPOETIN ALFA 300 MCG/0.6ML IJ SOSY
300.0000 ug | PREFILLED_SYRINGE | Freq: Once | INTRAMUSCULAR | Status: AC
  Administered 2015-02-18: 300 ug via SUBCUTANEOUS
  Filled 2015-02-18: qty 0.6

## 2015-02-18 NOTE — Progress Notes (Signed)
[place] OFFICE PROGRESS NOTE  Patient Care Team: Prince Solian, MD as PCP - General (Internal Medicine) Gatha Mayer, MD as Consulting Physician (Gastroenterology) Evans Lance, MD as Consulting Physician (Cardiology) Collene Gobble, MD as Consulting Physician (Pulmonary Disease) Jiles Prows, MD as Attending Physician ([place]) Heath Lark, MD as Consulting Physician (Hematology and Oncology) Prince Solian, MD  SUMMARY OF ONCOLOGIC HISTORY:  This patient have remote history of breast cancer diagnosed from screening mammogram in 2003. She had surgery, radiation therapy and adjuvant tamoxifen. 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 She had received B12 injection for B12 deficiency and intravenous iron for iron deficiency anemia. The patient had history of allergic reaction to intravenous iron dextran. She received intravenous iron feraheme in November 3329 without complications. In May 2016, she was found to have melanoma and significant GI bleed. On 11/24/2014, EGD and small bowel endoscopy revealed gastritis, AVM and duodenal mass. Biopsy showed nonmalignant tubular adenoma.Accession: [id number] CT scan of the abdomen dated 11/13/2014 show no retroperitoneal or other acute hemorrhage. Descending colostomy with peristomal hernia containing nonobstructed bowel. Possible cirrhosis She have recurrent admission to the hospital for severe anemia. Her case was recently presented at GI tumor board  Repeat EGD on 12/25/2014 again showed malignant mass but biopsy was benign From 12/17/2014 to present, she received intermittent Aranesp injection and blood transfusion  INTERVAL HISTORY: Please see below for problem oriented charting. She feels great. Previous hematoma site over the port area is healing well. She denies recent cough or infection. She developed new onset of nausea recently and  was prescribed Zofran. She had very mild constipation. The patient denies any recent signs or symptoms of bleeding such as spontaneous epistaxis, hematuria or hematochezia.   REVIEW OF SYSTEMS:   Constitutional: Denies fevers, chills or abnormal weight loss Eyes: Denies blurriness of vision Ears, nose, mouth, throat, and face: Denies mucositis or sore throat Respiratory: Denies cough, dyspnea or wheezes Cardiovascular: Denies palpitation, chest discomfort or lower extremity swelling Skin: Denies abnormal skin rashes Lymphatics: Denies new lymphadenopathy  Neurological:Denies numbness, tingling or new weaknesses Behavioral/Psych: Mood is stable, no new changes  All other systems were reviewed with the patient and are negative.  I have reviewed the past medical history, past surgical history, social history and family history with the patient and they are unchanged from previous note.  ALLERGIES:  is allergic to aspirin; sulfonamide derivatives; ibuprofen; iron dextran; sulfa antibiotics; tape; and metoclopramide hcl.  MEDICATIONS:  Current Outpatient Prescriptions  Medication Sig Dispense Refill  . acetaminophen (TYLENOL) 325 MG tablet Take 650 mg by mouth every 6 (six) hours as needed for mild pain.     [name] albuterol (PROVENTIL HFA;VENTOLIN HFA) 108 (90 BASE) MCG/ACT inhaler Inhale 2 puffs into the lungs 2 (two) times daily.    [name] albuterol (PROVENTIL) (2.5 MG/3ML) 0.083% nebulizer solution Take 2.5 mg by nebulization every 4 (four) hours as needed for wheezing or shortness of breath.    . ALPRAZolam (XANAX) 0.25 MG tablet Take 0.25 mg by mouth at bedtime.     . baclofen (LIORESAL) 10 MG tablet Take 10 mg by mouth every 8 (eight) hours as needed for muscle spasms.     . beclomethasone (QVAR) 80 MCG/ACT inhaler Inhale 3 puffs into the lungs 2 (two) times daily.     . budesonide (RHINOCORT AQUA) 32 MCG/ACT nasal spray Place 1 spray into both nostrils 2 (two) times  daily.     . Calcium  Carbonate-Vitamin D 600-400 MG-UNIT per tablet Take 1 tablet by mouth daily.    . Carboxymethylcellulose Sodium (LUBRICANT EYE DROPS OP) Apply 2 drops to eye 2 (two) times daily.     [name] DEXILANT 60 MG capsule Take 60 mg by mouth daily before breakfast.  3  . diphenhydrAMINE (BENADRYL) 25 mg capsule Take 25 mg by mouth daily as needed for itching.     . DULoxetine (CYMBALTA) 30 MG capsule Take 30 mg by mouth at bedtime.  3  . EPINEPHrine (EPIPEN IJ) Inject 1 application as directed as directed. AS NEEDED for anaphylaxis    . Eszopiclone 3 MG TABS Take 3 mg by mouth daily as needed (sleep).   2  . guaiFENesin-dextromethorphan (ROBITUSSIN DM) 100-10 MG/5ML syrup Take 15 mLs by mouth every 4 (four) hours as needed for cough.    . hydrocortisone cream 1 % Apply 1 application topically daily as needed for itching.    [name] ketoconazole (NIZORAL) 2 % cream Apply 1 application topically daily as needed. Apply to affected area (rash).  6  . levothyroxine (SYNTHROID, LEVOTHROID) 50 MCG tablet Take 50 mcg by mouth daily.  3  . losartan (COZAAR) 50 MG tablet Take 50 mg by mouth daily.    . metoprolol succinate (TOPROL-XL) 50 MG 24 hr tablet Take 50 mg by mouth daily.  1  . mirtazapine (REMERON) 15 MG tablet Take 15 mg by mouth at bedtime.   3  . Morphine Sulfate (MORPHINE CONCENTRATE) 10 mg / 0.5 ml concentrated solution Place 0.25 mLs under the tongue every 2 (two) hours as needed.    . Multiple Vitamins-Minerals (MULTIVITAMIN & MINERAL PO) Take 1 tablet by mouth daily.    . ondansetron (ZOFRAN-ODT) 4 MG disintegrating tablet Take 4 mg by mouth every 4 (four) hours as needed.    [name] oxycodone (OXY-IR) 5 MG capsule Take 5 mg by mouth every 4 (four) hours as needed for pain.    . polyethylene glycol (MIRALAX / GLYCOLAX) packet Take 17 g by mouth daily as needed for mild constipation or moderate constipation.    . potassium chloride (MICRO-K) 10 MEQ CR capsule     . pravastatin (PRAVACHOL) 40 MG tablet Take 40 mg by  mouth at bedtime.   2  . predniSONE (DELTASONE) 2.5 MG tablet Take 5 tablets by mouth daily before breakfast.    . ranitidine (ZANTAC) 300 MG tablet Take 300 mg by mouth at bedtime.  3  . senna-docusate (SENOKOT-S) 8.6-50 MG per tablet Take 1 tablet by mouth at bedtime.     . traZODone (DESYREL) 50 MG tablet Take 50 mg by mouth at bedtime.    . Vitamin D, Ergocalciferol, (DRISDOL) 50000 UNITS CAPS capsule Take 50,000 Units by mouth once a week. Every Sunday  3  . zafirlukast (ACCOLATE) 20 MG tablet Take 20 mg by mouth 2 (two) times daily.  3   No current facility-administered medications for this visit.    PHYSICAL EXAMINATION: ECOG PERFORMANCE STATUS: 2 - Symptomatic, <50% confined to bed  Filed Vitals:   02/18/15 1041  BP: 138/60  Pulse: 65  Temp: 98.8 F (37.1 C)  Resp: 17   Filed Weights   02/18/15 1041  Weight: 170 lb 12.8 oz (77.474 kg)    GENERAL:alert, no distress and comfortable SKIN: she has extensive bruises. The skin area over the port site is healing well EYES: normal, Conjunctiva are pink and non-injected, sclera clear OROPHARYNX:no exudate,  no erythema and lips, buccal mucosa, and tongue normal  Musculoskeletal:no cyanosis of digits and no clubbing  NEURO: alert & oriented x 3 with fluent speech, no focal motor/sensory deficits  LABORATORY DATA:  I have reviewed the data as listed    Component Value Date/Time   NA 138 01/05/2015 1313   NA 136 11/26/2014 0550   K 4.5 01/05/2015 1313   K 3.7 11/26/2014 0550   CL 102 11/26/2014 0550   CO2 24 01/05/2015 1313   CO2 26 11/26/2014 0550   GLUCOSE 107 01/05/2015 1313   GLUCOSE 96 11/26/2014 0550   BUN 28.7* 01/05/2015 1313   BUN 21* 11/26/2014 0550   CREATININE 1.0 01/05/2015 1313   CREATININE 1.12* 11/26/2014 0550   CALCIUM 9.7 01/05/2015 1313   CALCIUM 9.1 11/26/2014 0550   PROT 5.6* 01/05/2015 1313   PROT 4.8* 11/22/2014 0513   ALBUMIN 3.5 01/05/2015 1313   ALBUMIN 3.0* 11/22/2014 0513   AST 21  01/05/2015 1313   AST 21 11/22/2014 0513   ALT 27 01/05/2015 1313   ALT 19 11/22/2014 0513   ALKPHOS 47 01/05/2015 1313   ALKPHOS 36* 11/22/2014 0513   BILITOT 0.80 01/05/2015 1313   BILITOT 1.6* 11/22/2014 0513   GFRNONAA 46* 11/26/2014 0550   GFRAA 53* 11/26/2014 0550    No results found for: SPEP, UPEP  Lab Results  Component Value Date   WBC 4.4 02/18/2015   NEUTROABS 3.0 02/11/2015   HGB 9.5* 02/18/2015   HCT 28.3* 02/18/2015   MCV 89.5 02/18/2015   PLT 85 Large platelets present* 02/18/2015      Chemistry      Component Value Date/Time   NA 138 01/05/2015 1313   NA 136 11/26/2014 0550   K 4.5 01/05/2015 1313   K 3.7 11/26/2014 0550   CL 102 11/26/2014 0550   CO2 24 01/05/2015 1313   CO2 26 11/26/2014 0550   BUN 28.7* 01/05/2015 1313   BUN 21* 11/26/2014 0550   CREATININE 1.0 01/05/2015 1313   CREATININE 1.12* 11/26/2014 0550      Component Value Date/Time   CALCIUM 9.7 01/05/2015 1313   CALCIUM 9.1 11/26/2014 0550   ALKPHOS 47 01/05/2015 1313   ALKPHOS 36* 11/22/2014 0513   AST 21 01/05/2015 1313   AST 21 11/22/2014 0513   ALT 27 01/05/2015 1313   ALT 19 11/22/2014 0513   BILITOT 0.80 01/05/2015 1313   BILITOT 1.6* 11/22/2014 0513     ASSESSMENT & PLAN:  Duodenal mass She has intermittent nausea which I suspect is related to the duodenal mass. As discussed previously, she is just on palliative mode and does not want any treatment. Her palliative care physician has prescribed Zofran to take as needed  Anemia in chronic illness The absence of significant reticulocytosis in the presence of recent GI bleed suggested component of anemia of chronic disease. Recent serum vitamin B-12 and iron studies were adequate. Serum erythropoietin level is low She responded somewhat to Aranesp injection but continues to require intermittent blood transfusion. Goal is to keep hemoglobin > 10 or blood transfusion as needed to keep hemoglobin greater than 8.    Skin  lesion She has very thin skin and recently developed significant hematoma around the port site. After many weeks of careful dressing changes, she has developed a nice scab over the previous site. I recommend she continue dressing changes as needed   No orders of the defined types were placed in this encounter.   All questions  were answered. The patient knows to call the clinic with any problems, questions or concerns. No barriers to learning was detected. I spent 15 minutes counseling the patient face to face. The total time spent in the appointment was 20 minutes and more than 50% was on counseling and review of test results     [facility], Philipp Callegari, MD 02/18/2015 5:13 PM

## 2015-02-18 NOTE — Assessment & Plan Note (Signed)
The absence of significant reticulocytosis in the presence of recent GI bleed suggested component of anemia of chronic disease. Recent serum vitamin B-12 and iron studies were adequate. Serum erythropoietin level is low She responded somewhat to Aranesp injection but continues to require intermittent blood transfusion. Goal is to keep hemoglobin > 10 or blood transfusion as needed to keep hemoglobin greater than 8.

## 2015-02-18 NOTE — Telephone Encounter (Signed)
s.w. pt and advised on Sept appt pt ok and aware °

## 2015-02-18 NOTE — Assessment & Plan Note (Signed)
She has very thin skin and recently developed significant hematoma around the port site. After many weeks of careful dressing changes, she has developed a nice scab over the previous site. I recommend she continue dressing changes as needed

## 2015-02-18 NOTE — Assessment & Plan Note (Signed)
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 As discussed previously, she is just on palliative mode and does not want any treatment. Her palliative care physician has prescribed Zofran to take as needed

## 2015-02-19 ENCOUNTER — Ambulatory Visit (HOSPITAL_COMMUNITY)
Admission: RE | Admit: 2015-02-19 | Discharge: 2015-02-19 | Disposition: A | Discharge status: Home / Self Care | Payer: Medicare Other | Source: Ambulatory Visit | Attending: Hematology and Oncology | Admitting: Hematology and Oncology

## 2015-02-19 DIAGNOSIS — D630 Anemia in neoplastic disease: Secondary | ICD-10-CM | POA: Insufficient documentation

## 2015-02-25 ENCOUNTER — Other Ambulatory Visit: Payer: Self-pay

## 2015-02-26 ENCOUNTER — Other Ambulatory Visit: Payer: Self-pay | Admitting: Hematology and Oncology

## 2015-02-26 DIAGNOSIS — D509 Iron deficiency anemia, unspecified: Secondary | ICD-10-CM

## 2015-02-27 ENCOUNTER — Ambulatory Visit (HOSPITAL_BASED_OUTPATIENT_CLINIC_OR_DEPARTMENT_OTHER): Payer: Medicare Other

## 2015-02-27 ENCOUNTER — Encounter: Payer: Self-pay | Admitting: Hematology and Oncology

## 2015-02-27 ENCOUNTER — Other Ambulatory Visit: Payer: Self-pay | Admitting: *Deleted

## 2015-02-27 ENCOUNTER — Encounter: Payer: Self-pay | Admitting: Nutrition

## 2015-02-27 ENCOUNTER — Other Ambulatory Visit (HOSPITAL_BASED_OUTPATIENT_CLINIC_OR_DEPARTMENT_OTHER): Payer: Medicare Other

## 2015-02-27 ENCOUNTER — Ambulatory Visit: Payer: Medicare Other | Admitting: Nutrition

## 2015-02-27 ENCOUNTER — Telehealth: Payer: Self-pay | Admitting: Hematology and Oncology

## 2015-02-27 ENCOUNTER — Ambulatory Visit (HOSPITAL_BASED_OUTPATIENT_CLINIC_OR_DEPARTMENT_OTHER): Payer: Medicare Other | Admitting: Hematology and Oncology

## 2015-02-27 VITALS — BP 118/45 | HR 65 | Temp 98.3°F | Resp 18 | Ht 64.5 in | Wt 170.7 lb

## 2015-02-27 VITALS — BP 115/45 | HR 65 | Temp 99.0°F | Resp 18

## 2015-02-27 DIAGNOSIS — D630 Anemia in neoplastic disease: Secondary | ICD-10-CM | POA: Diagnosis present

## 2015-02-27 DIAGNOSIS — L989 Disorder of the skin and subcutaneous tissue, unspecified: Secondary | ICD-10-CM | POA: Diagnosis not present

## 2015-02-27 DIAGNOSIS — Z853 Personal history of malignant neoplasm of breast: Secondary | ICD-10-CM | POA: Diagnosis not present

## 2015-02-27 DIAGNOSIS — D518 Other vitamin B12 deficiency anemias: Secondary | ICD-10-CM

## 2015-02-27 DIAGNOSIS — D638 Anemia in other chronic diseases classified elsewhere: Secondary | ICD-10-CM

## 2015-02-27 DIAGNOSIS — D509 Iron deficiency anemia, unspecified: Secondary | ICD-10-CM

## 2015-02-27 DIAGNOSIS — K3189 Other diseases of stomach and duodenum: Secondary | ICD-10-CM

## 2015-02-27 LAB — CBC & DIFF AND RETIC
BASO%: 0.5 % (ref 0.0–2.0)
BASOS ABS: 0 10*3/uL (ref 0.0–0.1)
EOS ABS: 0.1 10*3/uL (ref 0.0–0.5)
EOS%: 1 % (ref 0.0–7.0)
HEMATOCRIT: 24.7 % — AB (ref 34.8–46.6)
HEMOGLOBIN: 8.1 g/dL — AB (ref 11.6–15.9)
IMMATURE RETIC FRACT: 11.4 % — AB (ref 1.60–10.00)
LYMPH%: 38.5 % (ref 14.0–49.7)
MCH: 29.7 pg (ref 25.1–34.0)
MCHC: 32.8 g/dL (ref 31.5–36.0)
MCV: 90.5 fL (ref 79.5–101.0)
MONO#: 0.4 10*3/uL (ref 0.1–0.9)
MONO%: 6.8 % (ref 0.0–14.0)
NEUT#: 3.1 10*3/uL (ref 1.5–6.5)
NEUT%: 53.2 % (ref 38.4–76.8)
NRBC: 1 % — AB (ref 0–0)
Platelets: 100 10*3/uL — ABNORMAL LOW (ref 145–400)
RBC: 2.73 10*6/uL — ABNORMAL LOW (ref 3.70–5.45)
RDW: 16.7 % — AB (ref 11.2–14.5)
Retic %: 2.51 % — ABNORMAL HIGH (ref 0.70–2.10)
Retic Ct Abs: 68.52 10*3/uL (ref 33.70–90.70)
WBC: 5.9 10*3/uL (ref 3.9–10.3)
lymph#: 2.3 10*3/uL (ref 0.9–3.3)

## 2015-02-27 LAB — HOLD TUBE, BLOOD BANK

## 2015-02-27 LAB — TECHNOLOGIST REVIEW

## 2015-02-27 LAB — PREPARE RBC (CROSSMATCH)

## 2015-02-27 MED ORDER — ACETAMINOPHEN 325 MG PO TABS
650.0000 mg | ORAL_TABLET | Freq: Once | ORAL | Status: AC
  Administered 2015-02-27: 650 mg via ORAL

## 2015-02-27 MED ORDER — OMALIZUMAB 150 MG ~~LOC~~ SOLR
300.0000 mg | SUBCUTANEOUS | Status: AC
  Administered 2015-03-31: 300 mg via SUBCUTANEOUS

## 2015-02-27 MED ORDER — SODIUM CHLORIDE 0.9 % IV SOLN
250.0000 mL | Freq: Once | INTRAVENOUS | Status: AC
  Administered 2015-02-27: 250 mL via INTRAVENOUS

## 2015-02-27 MED ORDER — ACETAMINOPHEN 325 MG PO TABS
ORAL_TABLET | ORAL | Status: AC
  Filled 2015-02-27: qty 2

## 2015-02-27 MED ORDER — DARBEPOETIN ALFA 300 MCG/0.6ML IJ SOSY
300.0000 ug | PREFILLED_SYRINGE | Freq: Once | INTRAMUSCULAR | Status: AC
  Administered 2015-02-27: 300 ug via SUBCUTANEOUS
  Filled 2015-02-27: qty 0.6

## 2015-02-27 MED ORDER — DIPHENHYDRAMINE HCL 25 MG PO CAPS
25.0000 mg | ORAL_CAPSULE | Freq: Once | ORAL | Status: AC
  Administered 2015-02-27: 25 mg via ORAL

## 2015-02-27 MED ORDER — HEPARIN SOD (PORK) LOCK FLUSH 100 UNIT/ML IV SOLN
500.0000 [IU] | Freq: Every day | INTRAVENOUS | Status: DC | PRN
  Filled 2015-02-27: qty 5

## 2015-02-27 MED ORDER — SODIUM CHLORIDE 0.9 % IJ SOLN
10.0000 mL | INTRAMUSCULAR | Status: DC | PRN
  Filled 2015-02-27: qty 10

## 2015-02-27 MED ORDER — DIPHENHYDRAMINE HCL 25 MG PO CAPS
ORAL_CAPSULE | ORAL | Status: AC
  Filled 2015-02-27: qty 1

## 2015-02-27 NOTE — Assessment & Plan Note (Signed)
She has very thin skin and recently developed significant hematoma around the port site. After many weeks of careful dressing changes, she has developed a nice scab over the previous site. I recommend she continue dressing changes as needed

## 2015-02-27 NOTE — Patient Instructions (Signed)

## 2015-02-27 NOTE — Telephone Encounter (Signed)
Added appt pt will get sched in chemo °

## 2015-02-27 NOTE — Assessment & Plan Note (Signed)
The absence of significant reticulocytosis in the presence of recent GI bleed suggested component of anemia of chronic disease. Recent serum vitamin B-12 and iron studies were adequate. Serum erythropoietin level is low She responded somewhat to Aranesp injection but continues to require intermittent blood transfusion. Goal is to keep hemoglobin > 10 or blood transfusion as needed to keep hemoglobin greater than 8. We discussed some of the risks, benefits, and alternatives of blood transfusions. The patient is symptomatic from anemia and the hemoglobin level is critically low.  Some of the side-effects to be expected including risks of transfusion reactions, chills, infection, syndrome of volume overload and risk of hospitalization from various reasons and the patient is willing to proceed and went ahead to sign consent today.

## 2015-02-27 NOTE — Progress Notes (Signed)
81 year old female diagnosed with gastric mass.  She is a patient of Dr. Alvy Bimler.  Past medical history includes hypertension, anxiety, COPD, GERD, C. difficile, dementia, vitamin B12 deficiency, renal insufficiency, breast cancer.  Medications include Xanax, calcium, Cymbalta, Synthroid, Remeron, multivitamin, and prednisone.  Labs were reviewed.  Height: 64-1/2 inches. Weight: 170.8 pounds August 31. Usual body weight: 175 pounds. BMI: 28.88.  Patient is receiving palliative therapy. She states she has a poor appetite. She lives in an assisted-living and doesn't care much for the food. She eats breakfast and supper and drinks one boost plus at bedtime. Patient endorses 5 pound weight loss.  Nutrition diagnosis: Unintended weight loss related to gastric cancer as evidenced by 5 pound weight loss in one month.  Intervention: Patient educated to consume smaller, more frequent meals and snacks. Reviewed high-calorie, high-protein options and provided a fact sheet Recommended patient increase boost plus twice a day between meals. Teach back method used.  Monitoring, evaluation, goals: Patient will increase oral intake to improve quality-of-life.  Next visit: Patient will contact me for questions or concerns.  **Disclaimer: This note was dictated with voice recognition software. Similar sounding words can inadvertently be transcribed and this note may contain transcription errors which may not have been corrected upon publication of note.**

## 2015-02-27 NOTE — Assessment & Plan Note (Signed)
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 As discussed previously, she is just on palliative mode and does not want any treatment. Her palliative care physician has prescribed Zofran to take as needed

## 2015-02-27 NOTE — Progress Notes (Signed)
Pt to receive 2 units of PRBCs and Aranesp per Dr. Alvy Bimler.

## 2015-02-27 NOTE — Progress Notes (Signed)
[facility] OFFICE PROGRESS NOTE  Patient Care Team: Prince Solian, MD as PCP - General (Internal Medicine) Gatha Mayer, MD as Consulting Physician (Gastroenterology) Evans Lance, MD as Consulting Physician (Cardiology) Collene Gobble, MD as Consulting Physician (Pulmonary Disease) Jiles Prows, MD as Attending Physician ([place]) Heath Lark, MD as Consulting Physician (Hematology and Oncology) Prince Solian, MD  SUMMARY OF ONCOLOGIC HISTORY:  This patient have remote history of breast cancer diagnosed from screening mammogram in 2003. She had surgery, radiation therapy and adjuvant tamoxifen. 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 She had received B12 injection for B12 deficiency and intravenous iron for iron deficiency anemia. The patient had history of allergic reaction to intravenous iron dextran. She received intravenous iron feraheme in November 4562 without complications. In May 2016, she was found to have melanoma and significant GI bleed. On 11/24/2014, EGD and small bowel endoscopy revealed gastritis, AVM and duodenal mass. Biopsy showed nonmalignant tubular adenoma.Accession: [id number] CT scan of the abdomen dated 11/13/2014 show no retroperitoneal or other acute hemorrhage. Descending colostomy with peristomal hernia containing nonobstructed bowel. Possible cirrhosis She have recurrent admission to the hospital for severe anemia. Her case was recently presented at GI tumor board  Repeat EGD on 12/25/2014 again showed malignant mass but biopsy was benign From 12/17/2014 to present, she received intermittent Aranesp injection and blood transfusion   INTERVAL HISTORY: Please see below for problem oriented charting. She feels great. Previous hematoma site over the port area is healing well. She denies recent cough or infection. She developed new onset of nausea recently and  was prescribed Zofran. She had very mild constipation. The patient denies any recent signs or symptoms of bleeding such as spontaneous epistaxis, hematuria or hematochezia. She complained of fatigue. She celebrated her birthday recently with multiple family members.  REVIEW OF SYSTEMS:   Constitutional: Denies fevers, chills or abnormal weight loss Eyes: Denies blurriness of vision Ears, nose, mouth, throat, and face: Denies mucositis or sore throat Respiratory: Denies cough, dyspnea or wheezes Cardiovascular: Denies palpitation, chest discomfort or lower extremity swelling Gastrointestinal:  Denies nausea, heartburn or change in bowel habits Skin: Denies abnormal skin rashes Lymphatics: Denies new lymphadenopathy  Neurological:Denies numbness, tingling or new weaknesses Behavioral/Psych: Mood is stable, no new changes  All other systems were reviewed with the patient and are negative.  I have reviewed the past medical history, past surgical history, social history and family history with the patient and they are unchanged from previous note.  ALLERGIES:  is allergic to aspirin; sulfonamide derivatives; ibuprofen; iron dextran; sulfa antibiotics; tape; and metoclopramide hcl.  MEDICATIONS:  Current Outpatient Prescriptions  Medication Sig Dispense Refill  . acetaminophen (TYLENOL) 325 MG tablet Take 650 mg by mouth every 6 (six) hours as needed for mild pain.     [name] albuterol (PROVENTIL) (2.5 MG/3ML) 0.083% nebulizer solution Take 2.5 mg by nebulization every 4 (four) hours as needed for wheezing or shortness of breath.    [name] albuterol (VENTOLIN HFA) 108 (90 BASE) MCG/ACT inhaler Inhale 2 puffs into the lungs every 4 (four) hours as needed for wheezing or shortness of breath.    . ALPRAZolam (XANAX) 0.25 MG tablet Take 0.25 mg by mouth at bedtime.     . baclofen (LIORESAL) 10 MG tablet Take 10 mg by mouth every 8 (eight) hours as needed for muscle spasms.     . beclomethasone (QVAR) 80  MCG/ACT inhaler  Inhale 3 puffs into the lungs 2 (two) times daily.     . budesonide (RHINOCORT AQUA) 32 MCG/ACT nasal spray Place 1 spray into both nostrils 2 (two) times daily.     . Calcium Carbonate-Vitamin D 600-400 MG-UNIT per tablet Take 1 tablet by mouth daily.    . Carboxymethylcellulose Sodium (LUBRICANT EYE DROPS OP) Apply 2 drops to eye 2 (two) times daily.     . chlorpheniramine-HYDROcodone (TUSSIONEX PENNKINETIC ER) 10-8 MG/5ML SUER Take 5 mLs by mouth every 12 (twelve) hours as needed for cough.    . DEXILANT 60 MG capsule Take 60 mg by mouth daily before breakfast.  3  . diphenhydrAMINE (BENADRYL) 25 mg capsule Take 25 mg by mouth daily as needed for itching.     . DULoxetine (CYMBALTA) 30 MG capsule Take 30 mg by mouth at bedtime.  3  . EPINEPHrine (EPIPEN 2-PAK) 0.3 mg/0.3 mL IJ SOAJ injection Inject 0.3 mg into the muscle once.    . Eszopiclone 3 MG TABS Take 3 mg by mouth daily as needed (sleep).   2  . furosemide (LASIX) 20 MG tablet Take 20 mg by mouth daily.    [name] guaiFENesin-dextromethorphan (ROBITUSSIN DM) 100-10 MG/5ML syrup Take 15 mLs by mouth every 4 (four) hours as needed for cough.    . hydrocortisone cream 1 % Apply 1 application topically daily as needed for itching.    [name] ketoconazole (NIZORAL) 2 % cream Apply 1 application topically daily as needed. Apply to affected area (rash).  6  . levothyroxine (SYNTHROID, LEVOTHROID) 50 MCG tablet Take 50 mcg by mouth daily.  3  . losartan (COZAAR) 50 MG tablet Take 50 mg by mouth daily.    . metoprolol succinate (TOPROL-XL) 50 MG 24 hr tablet Take 50 mg by mouth daily.  1  . mirtazapine (REMERON) 15 MG tablet Take 15 mg by mouth at bedtime.   3  . Morphine Sulfate (MORPHINE CONCENTRATE) 10 mg / 0.5 ml concentrated solution Place 0.25 mLs under the tongue every 2 (two) hours as needed.    . Multiple Vitamins-Minerals (MULTIVITAMIN & MINERAL PO) Take 1 tablet by mouth daily.    . mupirocin cream (BACTROBAN) 2 % Apply 1  application topically 2 (two) times daily.    . ondansetron (ZOFRAN-ODT) 4 MG disintegrating tablet Take 4 mg by mouth every 4 (four) hours as needed.    [name] oxycodone (OXY-IR) 5 MG capsule Take 5 mg by mouth every 4 (four) hours as needed for pain.    . polyethylene glycol (MIRALAX / GLYCOLAX) packet Take 17 g by mouth daily as needed for mild constipation or moderate constipation.    . potassium chloride (MICRO-K) 10 MEQ CR capsule     . pravastatin (PRAVACHOL) 40 MG tablet Take 40 mg by mouth at bedtime.   2  . predniSONE (DELTASONE) 2.5 MG tablet Take 5 tablets by mouth daily before breakfast.    . predniSONE (DELTASONE) 5 MG tablet Take 5 mg by mouth daily.    . ranitidine (ZANTAC) 300 MG tablet Take 300 mg by mouth at bedtime.  3  . scopolamine (HYOSCINE) 0.25 % ophthalmic solution Place 2 drops into both eyes once.    . senna-docusate (SENOKOT-S) 8.6-50 MG per tablet Take 1 tablet by mouth at bedtime.     . terbinafine (LAMISIL) 1 % cream Apply 1 application topically as needed.    . traZODone (DESYREL) 50 MG tablet Take 50 mg by mouth at bedtime.    [name]  Vitamin D, Ergocalciferol, (DRISDOL) 50000 UNITS CAPS capsule Take 50,000 Units by mouth once a week. Every Sunday  3  . zafirlukast (ACCOLATE) 20 MG tablet Take 20 mg by mouth 2 (two) times daily.  3   Current Facility-Administered Medications  Medication Dose Route Frequency Provider Last Rate Last Dose  . [START ON 03/17/2015] omalizumab Arvid Right) injection 300 mg  300 mg Subcutaneous Q28 days Jiles Prows, MD       Facility-Administered Medications Ordered in Other Visits  Medication Dose Route Frequency Provider Last Rate Last Dose  . Darbepoetin Alfa (ARANESP) injection 300 mcg  300 mcg Subcutaneous Once Heath Lark, MD      . heparin lock flush 100 unit/mL  500 Units Intracatheter Daily PRN Heath Lark, MD      . sodium chloride 0.9 % injection 10 mL  10 mL Intracatheter PRN Heath Lark, MD        PHYSICAL EXAMINATION: ECOG  PERFORMANCE STATUS: 2 - Symptomatic, <50% confined to bed  Filed Vitals:   02/27/15 1105  BP: 118/45  Pulse: 65  Temp: 98.3 F (36.8 C)  Resp: 18   Filed Weights   02/27/15 1105  Weight: 170 lb 11.2 oz (77.429 kg)    GENERAL:alert, no distress and comfortable SKIN: skin color, texture, turgor are normal, no rashes or significant lesions. She had excessive skin bruising and thin skin. There is a scab over the port site, unchanged EYES: normal, Conjunctiva are  pale and non-injected, sclera clear Musculoskeletal:no cyanosis of digits and no clubbing  NEURO: alert & oriented x 3 with fluent speech, no focal motor/sensory deficits  LABORATORY DATA:  I have reviewed the data as listed    Component Value Date/Time   NA 138 01/05/2015 1313   NA 136 11/26/2014 0550   K 4.5 01/05/2015 1313   K 3.7 11/26/2014 0550   CL 102 11/26/2014 0550   CO2 24 01/05/2015 1313   CO2 26 11/26/2014 0550   GLUCOSE 107 01/05/2015 1313   GLUCOSE 96 11/26/2014 0550   BUN 28.7* 01/05/2015 1313   BUN 21* 11/26/2014 0550   CREATININE 1.0 01/05/2015 1313   CREATININE 1.12* 11/26/2014 0550   CALCIUM 9.7 01/05/2015 1313   CALCIUM 9.1 11/26/2014 0550   PROT 5.6* 01/05/2015 1313   PROT 4.8* 11/22/2014 0513   ALBUMIN 3.5 01/05/2015 1313   ALBUMIN 3.0* 11/22/2014 0513   AST 21 01/05/2015 1313   AST 21 11/22/2014 0513   ALT 27 01/05/2015 1313   ALT 19 11/22/2014 0513   ALKPHOS 47 01/05/2015 1313   ALKPHOS 36* 11/22/2014 0513   BILITOT 0.80 01/05/2015 1313   BILITOT 1.6* 11/22/2014 0513   GFRNONAA 46* 11/26/2014 0550   GFRAA 53* 11/26/2014 0550    No results found for: SPEP, UPEP  Lab Results  Component Value Date   WBC 5.9 02/27/2015   NEUTROABS 3.1 02/27/2015   HGB 8.1* 02/27/2015   HCT 24.7* 02/27/2015   MCV 90.5 02/27/2015   PLT 100* 02/27/2015      Chemistry      Component Value Date/Time   NA 138 01/05/2015 1313   NA 136 11/26/2014 0550   K 4.5 01/05/2015 1313   K 3.7 11/26/2014  0550   CL 102 11/26/2014 0550   CO2 24 01/05/2015 1313   CO2 26 11/26/2014 0550   BUN 28.7* 01/05/2015 1313   BUN 21* 11/26/2014 0550   CREATININE 1.0 01/05/2015 1313   CREATININE 1.12* 11/26/2014 0550  Component Value Date/Time   CALCIUM 9.7 01/05/2015 1313   CALCIUM 9.1 11/26/2014 0550   ALKPHOS 47 01/05/2015 1313   ALKPHOS 36* 11/22/2014 0513   AST 21 01/05/2015 1313   AST 21 11/22/2014 0513   ALT 27 01/05/2015 1313   ALT 19 11/22/2014 0513   BILITOT 0.80 01/05/2015 1313   BILITOT 1.6* 11/22/2014 0513      ASSESSMENT & PLAN:  Anemia in chronic illness The absence of significant reticulocytosis in the presence of recent GI bleed suggested component of anemia of chronic disease. Recent serum vitamin B-12 and iron studies were adequate. Serum erythropoietin level is low She responded somewhat to Aranesp injection but continues to require intermittent blood transfusion. Goal is to keep hemoglobin > 10 or blood transfusion as needed to keep hemoglobin greater than 8. We discussed some of the risks, benefits, and alternatives of blood transfusions. The patient is symptomatic from anemia and the hemoglobin level is critically low.  Some of the side-effects to be expected including risks of transfusion reactions, chills, infection, syndrome of volume overload and risk of hospitalization from various reasons and the patient is willing to proceed and went ahead to sign consent today.  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 As discussed previously, she is just on palliative mode and does not want any treatment. Her palliative care physician has prescribed Zofran to take as needed  Skin lesion She has very thin skin and recently developed significant hematoma around the port site. After many weeks of careful dressing changes, she has developed a nice scab over the previous site. I recommend she continue dressing changes as  needed     No orders of the defined types were placed in this encounter.   All questions were answered. The patient knows to call the clinic with any problems, questions or concerns. No barriers to learning was detected. I spent 20 minutes counseling the patient face to face. The total time spent in the appointment was 25 minutes and more than 50% was on counseling and review of test results     [facility], Lajoyce Tamura, MD 02/27/2015 1:10 PM

## 2015-02-28 LAB — TYPE AND SCREEN
ABO/RH(D): A POS
Antibody Screen: NEGATIVE
UNIT DIVISION: 0
Unit division: 0

## 2015-03-05 ENCOUNTER — Telehealth: Payer: Self-pay | Admitting: Internal Medicine

## 2015-03-05 NOTE — Telephone Encounter (Signed)
Pt stated that she wanted to send a remote transmission today to check her battery life b/c she was letting it die. Informed pt that this is ok.

## 2015-03-05 NOTE — Telephone Encounter (Signed)
New message    Pt states they are letting her pacemaker run down Pt wants to know the status of run down

## 2015-03-05 NOTE — Telephone Encounter (Signed)
Informed pt that I would have MD nurse call her back pt agreed to this plan.

## 2015-03-05 NOTE — Telephone Encounter (Signed)
Spoke with both the patient and her daughter explaining the remote transmission received today.  Her battery V is 2.57 and after speaking with tech support the battery may start having erratic pacing between 1.8V-2.2V.  She is aware that this is a curve and not a pin point date

## 2015-03-05 NOTE — Telephone Encounter (Signed)
Spoke with

## 2015-03-16 ENCOUNTER — Other Ambulatory Visit: Payer: Self-pay | Admitting: Hematology and Oncology

## 2015-03-16 ENCOUNTER — Encounter: Payer: Self-pay | Admitting: Hematology and Oncology

## 2015-03-16 ENCOUNTER — Telehealth: Payer: Self-pay | Admitting: Hematology and Oncology

## 2015-03-16 ENCOUNTER — Ambulatory Visit (HOSPITAL_BASED_OUTPATIENT_CLINIC_OR_DEPARTMENT_OTHER): Payer: Medicare Other

## 2015-03-16 ENCOUNTER — Ambulatory Visit (HOSPITAL_BASED_OUTPATIENT_CLINIC_OR_DEPARTMENT_OTHER): Payer: Medicare Other | Admitting: Hematology and Oncology

## 2015-03-16 ENCOUNTER — Other Ambulatory Visit (HOSPITAL_BASED_OUTPATIENT_CLINIC_OR_DEPARTMENT_OTHER): Payer: Medicare Other

## 2015-03-16 VITALS — BP 127/62 | HR 69 | Temp 99.0°F | Resp 18

## 2015-03-16 VITALS — BP 129/51 | HR 65 | Temp 98.8°F | Resp 18 | Ht 64.5 in | Wt 172.0 lb

## 2015-03-16 DIAGNOSIS — D630 Anemia in neoplastic disease: Secondary | ICD-10-CM | POA: Diagnosis not present

## 2015-03-16 DIAGNOSIS — D62 Acute posthemorrhagic anemia: Secondary | ICD-10-CM | POA: Diagnosis not present

## 2015-03-16 DIAGNOSIS — D509 Iron deficiency anemia, unspecified: Secondary | ICD-10-CM

## 2015-03-16 DIAGNOSIS — D696 Thrombocytopenia, unspecified: Secondary | ICD-10-CM

## 2015-03-16 DIAGNOSIS — K3189 Other diseases of stomach and duodenum: Secondary | ICD-10-CM | POA: Diagnosis present

## 2015-03-16 DIAGNOSIS — T402X5A Adverse effect of other opioids, initial encounter: Secondary | ICD-10-CM

## 2015-03-16 DIAGNOSIS — D518 Other vitamin B12 deficiency anemias: Secondary | ICD-10-CM

## 2015-03-16 DIAGNOSIS — K5909 Other constipation: Secondary | ICD-10-CM | POA: Diagnosis not present

## 2015-03-16 DIAGNOSIS — D638 Anemia in other chronic diseases classified elsewhere: Secondary | ICD-10-CM

## 2015-03-16 DIAGNOSIS — K5903 Drug induced constipation: Secondary | ICD-10-CM

## 2015-03-16 DIAGNOSIS — L989 Disorder of the skin and subcutaneous tissue, unspecified: Secondary | ICD-10-CM | POA: Diagnosis not present

## 2015-03-16 LAB — PREPARE RBC (CROSSMATCH)

## 2015-03-16 LAB — CBC & DIFF AND RETIC
BASO%: 0.7 % (ref 0.0–2.0)
Basophils Absolute: 0.1 10*3/uL (ref 0.0–0.1)
EOS%: 0.8 % (ref 0.0–7.0)
Eosinophils Absolute: 0.1 10*3/uL (ref 0.0–0.5)
HCT: 22.5 % — ABNORMAL LOW (ref 34.8–46.6)
HGB: 7.4 g/dL — ABNORMAL LOW (ref 11.6–15.9)
Immature Retic Fract: 11.2 % — ABNORMAL HIGH (ref 1.60–10.00)
LYMPH#: 3.3 10*3/uL (ref 0.9–3.3)
LYMPH%: 44.7 % (ref 14.0–49.7)
MCH: 29.1 pg (ref 25.1–34.0)
MCHC: 32.9 g/dL (ref 31.5–36.0)
MCV: 88.6 fL (ref 79.5–101.0)
MONO#: 0.4 10*3/uL (ref 0.1–0.9)
MONO%: 4.9 % (ref 0.0–14.0)
NEUT%: 48.9 % (ref 38.4–76.8)
NEUTROS ABS: 3.6 10*3/uL (ref 1.5–6.5)
PLATELETS: 50 10*3/uL — AB (ref 145–400)
RBC: 2.54 10*6/uL — AB (ref 3.70–5.45)
RDW: 17.9 % — ABNORMAL HIGH (ref 11.2–14.5)
RETIC %: 4.36 % — AB (ref 0.70–2.10)
RETIC CT ABS: 110.74 10*3/uL — AB (ref 33.70–90.70)
WBC: 7.4 10*3/uL (ref 3.9–10.3)
nRBC: 2 % — ABNORMAL HIGH (ref 0–0)

## 2015-03-16 LAB — HOLD TUBE, BLOOD BANK

## 2015-03-16 LAB — TECHNOLOGIST REVIEW

## 2015-03-16 MED ORDER — DARBEPOETIN ALFA 300 MCG/0.6ML IJ SOSY
300.0000 ug | PREFILLED_SYRINGE | Freq: Once | INTRAMUSCULAR | Status: AC
  Administered 2015-03-16: 300 ug via SUBCUTANEOUS
  Filled 2015-03-16: qty 0.6

## 2015-03-16 MED ORDER — DIPHENHYDRAMINE HCL 25 MG PO CAPS
25.0000 mg | ORAL_CAPSULE | Freq: Once | ORAL | Status: AC
  Administered 2015-03-16: 25 mg via ORAL

## 2015-03-16 MED ORDER — ACETAMINOPHEN 325 MG PO TABS
ORAL_TABLET | ORAL | Status: AC
  Filled 2015-03-16: qty 2

## 2015-03-16 MED ORDER — ACETAMINOPHEN 325 MG PO TABS
650.0000 mg | ORAL_TABLET | Freq: Once | ORAL | Status: AC
  Administered 2015-03-16: 650 mg via ORAL

## 2015-03-16 MED ORDER — DIPHENHYDRAMINE HCL 25 MG PO CAPS
ORAL_CAPSULE | ORAL | Status: AC
  Filled 2015-03-16: qty 1

## 2015-03-16 MED ORDER — SODIUM CHLORIDE 0.9 % IV SOLN
250.0000 mL | Freq: Once | INTRAVENOUS | Status: AC
  Administered 2015-03-16: 250 mL via INTRAVENOUS

## 2015-03-16 NOTE — Progress Notes (Signed)
[facility] OFFICE PROGRESS NOTE  Patient Care Team: Prince Solian, MD as PCP - General (Internal Medicine) Gatha Mayer, MD as Consulting Physician (Gastroenterology) Evans Lance, MD as Consulting Physician (Cardiology) Collene Gobble, MD as Consulting Physician (Pulmonary Disease) Jiles Prows, MD as Attending Physician ([place]) Heath Lark, MD as Consulting Physician (Hematology and Oncology) Prince Solian, MD  SUMMARY OF ONCOLOGIC HISTORY:  This patient have remote history of breast cancer diagnosed from screening mammogram in 2003. She had surgery, radiation therapy and adjuvant tamoxifen. 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 She had received B12 injection for B12 deficiency and intravenous iron for iron deficiency anemia. The patient had history of allergic reaction to intravenous iron dextran. She received intravenous iron feraheme in November 2595 without complications. In May 2016, she was found to have melanoma and significant GI bleed. On 11/24/2014, EGD and small bowel endoscopy revealed gastritis, AVM and duodenal mass. Biopsy showed nonmalignant tubular adenoma.Accession: [id number] CT scan of the abdomen dated 11/13/2014 show no retroperitoneal or other acute hemorrhage. Descending colostomy with peristomal hernia containing nonobstructed bowel. Possible cirrhosis She have recurrent admission to the hospital for severe anemia. Her case was recently presented at GI tumor board  Repeat EGD on 12/25/2014 again showed malignant mass but biopsy was benign From 12/17/2014 to present, she received intermittent Aranesp injection and blood transfusion   INTERVAL HISTORY: Please see below for problem oriented charting. She feels weak and terrible. Recently, show passage of dark, tarry stool. She also complain of chronic nausea and recent severe constipation. Prior to recent  bowel movement, she was constipated for 7 days, followed by passage of significant large amount of stool and subsequent diarrhea. She had no bowel movement since last week. Her oral intake is poor and her appetite is poor. She has intermittent right lower quadrant pain and chronic back pain which to pain medicine that was prescribed to her for pain which she found helpful. She takes Zofran regularly for nausea. She was told that there is an erosion over the port area.  REVIEW OF SYSTEMS:   Constitutional: Denies fevers, chills or abnormal weight loss Eyes: Denies blurriness of vision Ears, nose, mouth, throat, and face: Denies mucositis or sore throat Respiratory: Denies cough, dyspnea or wheezes Cardiovascular: Denies palpitation, chest discomfort or lower extremity swelling Skin: Denies abnormal skin rashes Lymphatics: Denies new lymphadenopathy  Neurological:Denies numbness, tingling Behavioral/Psych: Mood is stable, no new changes  All other systems were reviewed with the patient and are negative.  I have reviewed the past medical history, past surgical history, social history and family history with the patient and they are unchanged from previous note.  ALLERGIES:  is allergic to aspirin; sulfonamide derivatives; ibuprofen; iron dextran; sulfa antibiotics; tape; and metoclopramide hcl.  MEDICATIONS:  Current Outpatient Prescriptions  Medication Sig Dispense Refill  . acetaminophen (TYLENOL) 325 MG tablet Take 650 mg by mouth every 6 (six) hours as needed for mild pain.     [name] albuterol (PROVENTIL) (2.5 MG/3ML) 0.083% nebulizer solution Take 2.5 mg by nebulization every 4 (four) hours as needed for wheezing or shortness of breath.    [name] albuterol (VENTOLIN HFA) 108 (90 BASE) MCG/ACT inhaler Inhale 2 puffs into the lungs every 4 (four) hours as needed for wheezing or shortness of breath.    . ALPRAZolam (XANAX) 0.25 MG tablet Take 0.25 mg by mouth at bedtime.     . baclofen (  LIORESAL)  10 MG tablet Take 10 mg by mouth every 8 (eight) hours as needed for muscle spasms.     . beclomethasone (QVAR) 80 MCG/ACT inhaler Inhale 3 puffs into the lungs 2 (two) times daily.     . budesonide (RHINOCORT AQUA) 32 MCG/ACT nasal spray Place 1 spray into both nostrils 2 (two) times daily.     . Calcium Carbonate-Vitamin D 600-400 MG-UNIT per tablet Take 1 tablet by mouth daily.    . Carboxymethylcellulose Sodium (LUBRICANT EYE DROPS OP) Apply 2 drops to eye 2 (two) times daily.     . chlorpheniramine-HYDROcodone (TUSSIONEX PENNKINETIC ER) 10-8 MG/5ML SUER Take 5 mLs by mouth every 12 (twelve) hours as needed for cough.    . DEXILANT 60 MG capsule Take 60 mg by mouth daily before breakfast.  3  . diphenhydrAMINE (BENADRYL) 25 mg capsule Take 25 mg by mouth daily as needed for itching.     . DULoxetine (CYMBALTA) 30 MG capsule Take 30 mg by mouth at bedtime.  3  . EPINEPHrine (EPIPEN 2-PAK) 0.3 mg/0.3 mL IJ SOAJ injection Inject 0.3 mg into the muscle once.    . Eszopiclone 3 MG TABS Take 3 mg by mouth daily as needed (sleep).   2  . furosemide (LASIX) 20 MG tablet Take 20 mg by mouth daily.    [name] guaiFENesin-dextromethorphan (ROBITUSSIN DM) 100-10 MG/5ML syrup Take 15 mLs by mouth every 4 (four) hours as needed for cough.    . hydrocortisone cream 1 % Apply 1 application topically daily as needed for itching.    [name] ketoconazole (NIZORAL) 2 % cream Apply 1 application topically daily as needed. Apply to affected area (rash).  6  . levothyroxine (SYNTHROID, LEVOTHROID) 50 MCG tablet Take 50 mcg by mouth daily.  3  . losartan (COZAAR) 50 MG tablet Take 50 mg by mouth daily.    . metoprolol succinate (TOPROL-XL) 50 MG 24 hr tablet Take 50 mg by mouth daily.  1  . mirtazapine (REMERON) 15 MG tablet Take 15 mg by mouth at bedtime.   3  . Morphine Sulfate (MORPHINE CONCENTRATE) 10 mg / 0.5 ml concentrated solution Place 0.25 mLs under the tongue every 2 (two) hours as needed.    . Multiple  Vitamins-Minerals (MULTIVITAMIN & MINERAL PO) Take 1 tablet by mouth daily.    . mupirocin cream (BACTROBAN) 2 % Apply 1 application topically 2 (two) times daily.    . ondansetron (ZOFRAN-ODT) 4 MG disintegrating tablet Take 4 mg by mouth every 4 (four) hours as needed.    [name] oxycodone (OXY-IR) 5 MG capsule Take 5 mg by mouth every 4 (four) hours as needed for pain.    . polyethylene glycol (MIRALAX / GLYCOLAX) packet Take 17 g by mouth daily as needed for mild constipation or moderate constipation.    . potassium chloride (MICRO-K) 10 MEQ CR capsule     . pravastatin (PRAVACHOL) 40 MG tablet Take 40 mg by mouth at bedtime.   2  . predniSONE (DELTASONE) 2.5 MG tablet Take 5 tablets by mouth daily before breakfast.    . predniSONE (DELTASONE) 5 MG tablet Take 5 mg by mouth daily.    . ranitidine (ZANTAC) 300 MG tablet Take 300 mg by mouth at bedtime.  3  . scopolamine (HYOSCINE) 0.25 % ophthalmic solution Place 2 drops into both eyes once.    . senna-docusate (SENOKOT-S) 8.6-50 MG per tablet Take 1 tablet by mouth at bedtime.     . terbinafine (  LAMISIL) 1 % cream Apply 1 application topically as needed.    . traZODone (DESYREL) 50 MG tablet Take 50 mg by mouth at bedtime.    . Vitamin D, Ergocalciferol, (DRISDOL) 50000 UNITS CAPS capsule Take 50,000 Units by mouth once a week. Every Sunday  3  . zafirlukast (ACCOLATE) 20 MG tablet Take 20 mg by mouth 2 (two) times daily.  3   Current Facility-Administered Medications  Medication Dose Route Frequency Provider Last Rate Last Dose  . [START ON 03/17/2015] omalizumab Arvid Right) injection 300 mg  300 mg Subcutaneous Q28 days Jiles Prows, MD        PHYSICAL EXAMINATION: ECOG PERFORMANCE STATUS: 2 - Symptomatic, <50% confined to bed  Filed Vitals:   03/16/15 1035  BP: 129/51  Pulse: 65  Temp: 98.8 F (37.1 C)  Resp: 18   Filed Weights   03/16/15 1035  Weight: 172 lb (78.019 kg)    GENERAL:alert, no distress and comfortable SKIN: she has  very thin skin and excessive bruising. There is erosion of her port on the right chest wall EYES: normal, Conjunctiva are pale and non-injected, sclera clear OROPHARYNX:no exudate, no erythema and lips, buccal mucosa, and tongue normal  Musculoskeletal:no cyanosis of digits and no clubbing  NEURO: alert & oriented x 3 with fluent speech, no focal motor/sensory deficits  LABORATORY DATA:  I have reviewed the data as listed    Component Value Date/Time   NA 138 01/05/2015 1313   NA 136 11/26/2014 0550   K 4.5 01/05/2015 1313   K 3.7 11/26/2014 0550   CL 102 11/26/2014 0550   CO2 24 01/05/2015 1313   CO2 26 11/26/2014 0550   GLUCOSE 107 01/05/2015 1313   GLUCOSE 96 11/26/2014 0550   BUN 28.7* 01/05/2015 1313   BUN 21* 11/26/2014 0550   CREATININE 1.0 01/05/2015 1313   CREATININE 1.12* 11/26/2014 0550   CALCIUM 9.7 01/05/2015 1313   CALCIUM 9.1 11/26/2014 0550   PROT 5.6* 01/05/2015 1313   PROT 4.8* 11/22/2014 0513   ALBUMIN 3.5 01/05/2015 1313   ALBUMIN 3.0* 11/22/2014 0513   AST 21 01/05/2015 1313   AST 21 11/22/2014 0513   ALT 27 01/05/2015 1313   ALT 19 11/22/2014 0513   ALKPHOS 47 01/05/2015 1313   ALKPHOS 36* 11/22/2014 0513   BILITOT 0.80 01/05/2015 1313   BILITOT 1.6* 11/22/2014 0513   GFRNONAA 46* 11/26/2014 0550   GFRAA 53* 11/26/2014 0550    No results found for: SPEP, UPEP  Lab Results  Component Value Date   WBC 7.4 03/16/2015   NEUTROABS 3.6 03/16/2015   HGB 7.4* 03/16/2015   HCT 22.5* 03/16/2015   MCV 88.6 03/16/2015   PLT 50* 03/16/2015      Chemistry      Component Value Date/Time   NA 138 01/05/2015 1313   NA 136 11/26/2014 0550   K 4.5 01/05/2015 1313   K 3.7 11/26/2014 0550   CL 102 11/26/2014 0550   CO2 24 01/05/2015 1313   CO2 26 11/26/2014 0550   BUN 28.7* 01/05/2015 1313   BUN 21* 11/26/2014 0550   CREATININE 1.0 01/05/2015 1313   CREATININE 1.12* 11/26/2014 0550      Component Value Date/Time   CALCIUM 9.7 01/05/2015 1313    CALCIUM 9.1 11/26/2014 0550   ALKPHOS 47 01/05/2015 1313   ALKPHOS 36* 11/22/2014 0513   AST 21 01/05/2015 1313   AST 21 11/22/2014 0513   ALT 27 01/05/2015 1313   ALT  19 11/22/2014 0513   BILITOT 0.80 01/05/2015 1313   BILITOT 1.6* 11/22/2014 0513     ASSESSMENT & PLAN:  Duodenal mass She has intermittent nausea which I suspect is related to the duodenal mass. As discussed previously, she is just on palliative mode and does not want any treatment. Her palliative care physician has prescribed Zofran to take as needed    Acute blood loss anemia The absence of significant reticulocytosis in the presence of recent GI bleed suggested component of anemia of chronic disease. Recent serum vitamin B-12 and iron studies were adequate. Serum erythropoietin level is low She responded somewhat to Aranesp injection but continues to require intermittent blood transfusion. Goal is to keep hemoglobin > 10 or blood transfusion as needed to keep hemoglobin greater than 8. We discussed some of the risks, benefits, and alternatives of blood transfusions. The patient is symptomatic from anemia and the hemoglobin level is critically low.  Some of the side-effects to be expected including risks of transfusion reactions, chills, infection, syndrome of volume overload and risk of hospitalization from various reasons and the patient is willing to proceed and went ahead to sign consent today.  Thrombocytopenia She has acute on chronic thrombocytopenia, likely due to consumption from recent bleeding. She does not require platelet transfusion.  Skin lesion She has very thin skin and recently developed significant hematoma around the port site. After many weeks of careful dressing changes, she has developed a nice scab over the previous site. Recently, erosion of the port is noted. Due to significant co-morbidities, we would not get that extraction or the port for fear it can cause further skin injury. I  recommend she continue dressing changes as needed    Constipation due to opioid therapy She had recent severe constipation followed by passage of large amount of hard stool and mild diarrhea. I suspect she has chronic constipation, exacerbated by recent opiate therapy that she takes for pain. I recommend gentle, frequent laxatives to elevate to reduce the risk of chronic constipation   No orders of the defined types were placed in this encounter.   All questions were answered. The patient knows to call the clinic with any problems, questions or concerns. No barriers to learning was detected. I spent 25 minutes counseling the patient face to face. The total time spent in the appointment was 30 minutes and more than 50% was on counseling and review of test results     [facility], [place], MD 03/16/2015 2:35 PM

## 2015-03-16 NOTE — Assessment & Plan Note (Signed)
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 As discussed previously, she is just on palliative mode and does not want any treatment. Her palliative care physician has prescribed Zofran to take as needed

## 2015-03-16 NOTE — Patient Instructions (Signed)

## 2015-03-16 NOTE — Assessment & Plan Note (Signed)
She had recent severe constipation followed by passage of large amount of hard stool and mild diarrhea. I suspect she has chronic constipation, exacerbated by recent opiate therapy that she takes for pain. I recommend gentle, frequent laxatives to elevate to reduce the risk of chronic constipation

## 2015-03-16 NOTE — Telephone Encounter (Signed)
Gave patient avs report and appointments for October  °

## 2015-03-16 NOTE — Assessment & Plan Note (Signed)
She has acute on chronic thrombocytopenia, likely due to consumption from recent bleeding. She does not require platelet transfusion.

## 2015-03-16 NOTE — Assessment & Plan Note (Signed)
The absence of significant reticulocytosis in the presence of recent GI bleed suggested component of anemia of chronic disease. Recent serum vitamin B-12 and iron studies were adequate. Serum erythropoietin level is low She responded somewhat to Aranesp injection but continues to require intermittent blood transfusion. Goal is to keep hemoglobin > 10 or blood transfusion as needed to keep hemoglobin greater than 8. We discussed some of the risks, benefits, and alternatives of blood transfusions. The patient is symptomatic from anemia and the hemoglobin level is critically low.  Some of the side-effects to be expected including risks of transfusion reactions, chills, infection, syndrome of volume overload and risk of hospitalization from various reasons and the patient is willing to proceed and went ahead to sign consent today.

## 2015-03-16 NOTE — Assessment & Plan Note (Signed)
She has very thin skin and recently developed significant hematoma around the port site. After many weeks of careful dressing changes, she has developed a nice scab over the previous site. Recently, erosion of the port is noted. Due to significant co-morbidities, we would not get that extraction or the port for fear it can cause further skin injury. I recommend she continue dressing changes as needed

## 2015-03-17 LAB — TYPE AND SCREEN
ABO/RH(D): A POS
Antibody Screen: NEGATIVE
Unit division: 0
Unit division: 0

## 2015-03-23 ENCOUNTER — Ambulatory Visit (HOSPITAL_COMMUNITY)
Admission: RE | Admit: 2015-03-23 | Discharge: 2015-03-23 | Disposition: A | Discharge status: Home / Self Care | Payer: Medicare Other | Source: Ambulatory Visit | Attending: Hematology and Oncology | Admitting: Hematology and Oncology

## 2015-03-23 DIAGNOSIS — D630 Anemia in neoplastic disease: Secondary | ICD-10-CM | POA: Insufficient documentation

## 2015-03-26 ENCOUNTER — Ambulatory Visit (HOSPITAL_COMMUNITY)
Admission: RE | Admit: 2015-03-26 | Discharge: 2015-03-26 | Disposition: A | Discharge status: Home / Self Care | Payer: Medicare Other | Source: Ambulatory Visit | Attending: Hematology and Oncology | Admitting: Hematology and Oncology

## 2015-03-26 ENCOUNTER — Ambulatory Visit (HOSPITAL_BASED_OUTPATIENT_CLINIC_OR_DEPARTMENT_OTHER): Payer: Medicare Other

## 2015-03-26 ENCOUNTER — Telehealth: Payer: Self-pay | Admitting: *Deleted

## 2015-03-26 ENCOUNTER — Other Ambulatory Visit: Payer: Self-pay | Admitting: Hematology and Oncology

## 2015-03-26 ENCOUNTER — Ambulatory Visit (HOSPITAL_BASED_OUTPATIENT_CLINIC_OR_DEPARTMENT_OTHER): Payer: Medicare Other | Admitting: Hematology and Oncology

## 2015-03-26 ENCOUNTER — Other Ambulatory Visit (HOSPITAL_BASED_OUTPATIENT_CLINIC_OR_DEPARTMENT_OTHER): Payer: Medicare Other

## 2015-03-26 ENCOUNTER — Encounter: Payer: Self-pay | Admitting: Hematology and Oncology

## 2015-03-26 ENCOUNTER — Telehealth: Payer: Self-pay | Admitting: Hematology and Oncology

## 2015-03-26 VITALS — BP 124/52 | HR 64 | Temp 99.0°F | Resp 16

## 2015-03-26 VITALS — BP 113/38 | HR 65 | Temp 98.9°F | Resp 20 | Ht 64.5 in | Wt 171.1 lb

## 2015-03-26 DIAGNOSIS — D630 Anemia in neoplastic disease: Secondary | ICD-10-CM

## 2015-03-26 DIAGNOSIS — Z515 Encounter for palliative care: Secondary | ICD-10-CM

## 2015-03-26 DIAGNOSIS — D509 Iron deficiency anemia, unspecified: Secondary | ICD-10-CM

## 2015-03-26 DIAGNOSIS — L989 Disorder of the skin and subcutaneous tissue, unspecified: Secondary | ICD-10-CM | POA: Diagnosis not present

## 2015-03-26 DIAGNOSIS — D696 Thrombocytopenia, unspecified: Secondary | ICD-10-CM | POA: Diagnosis not present

## 2015-03-26 DIAGNOSIS — D638 Anemia in other chronic diseases classified elsewhere: Secondary | ICD-10-CM

## 2015-03-26 DIAGNOSIS — D518 Other vitamin B12 deficiency anemias: Secondary | ICD-10-CM

## 2015-03-26 DIAGNOSIS — Z66 Do not resuscitate: Secondary | ICD-10-CM

## 2015-03-26 LAB — CBC & DIFF AND RETIC
BASO%: 0.7 % (ref 0.0–2.0)
BASOS ABS: 0.1 10*3/uL (ref 0.0–0.1)
EOS%: 0.5 % (ref 0.0–7.0)
Eosinophils Absolute: 0 10*3/uL (ref 0.0–0.5)
HCT: 25 % — ABNORMAL LOW (ref 34.8–46.6)
HGB: 8 g/dL — ABNORMAL LOW (ref 11.6–15.9)
Immature Retic Fract: 14.1 % — ABNORMAL HIGH (ref 1.60–10.00)
LYMPH#: 2.8 10*3/uL (ref 0.9–3.3)
LYMPH%: 35.9 % (ref 14.0–49.7)
MCH: 28.9 pg (ref 25.1–34.0)
MCHC: 32 g/dL (ref 31.5–36.0)
MCV: 90.3 fL (ref 79.5–101.0)
MONO#: 0.6 10*3/uL (ref 0.1–0.9)
MONO%: 7.4 % (ref 0.0–14.0)
NEUT%: 55.5 % (ref 38.4–76.8)
NEUTROS ABS: 4.3 10*3/uL (ref 1.5–6.5)
Platelets: 54 10*3/uL — ABNORMAL LOW (ref 145–400)
RBC: 2.77 10*6/uL — AB (ref 3.70–5.45)
RDW: 17.2 % — AB (ref 11.2–14.5)
RETIC %: 3.27 % — AB (ref 0.70–2.10)
RETIC CT ABS: 90.58 10*3/uL (ref 33.70–90.70)
WBC: 7.7 10*3/uL (ref 3.9–10.3)

## 2015-03-26 LAB — HOLD TUBE, BLOOD BANK

## 2015-03-26 LAB — TECHNOLOGIST REVIEW

## 2015-03-26 LAB — PREPARE RBC (CROSSMATCH)

## 2015-03-26 MED ORDER — SODIUM CHLORIDE 0.9 % IV SOLN
250.0000 mL | Freq: Once | INTRAVENOUS | Status: AC
  Administered 2015-03-26: 250 mL via INTRAVENOUS

## 2015-03-26 MED ORDER — ACETAMINOPHEN 325 MG PO TABS
650.0000 mg | ORAL_TABLET | Freq: Once | ORAL | Status: AC
  Administered 2015-03-26: 650 mg via ORAL

## 2015-03-26 MED ORDER — DIPHENHYDRAMINE HCL 25 MG PO CAPS
25.0000 mg | ORAL_CAPSULE | Freq: Once | ORAL | Status: AC
  Administered 2015-03-26: 25 mg via ORAL

## 2015-03-26 MED ORDER — DIPHENHYDRAMINE HCL 25 MG PO CAPS
ORAL_CAPSULE | ORAL | Status: AC
  Filled 2015-03-26: qty 1

## 2015-03-26 MED ORDER — DARBEPOETIN ALFA 300 MCG/0.6ML IJ SOSY
300.0000 ug | PREFILLED_SYRINGE | Freq: Once | INTRAMUSCULAR | Status: AC
  Administered 2015-03-26: 300 ug via SUBCUTANEOUS
  Filled 2015-03-26: qty 0.6

## 2015-03-26 MED ORDER — ACETAMINOPHEN 325 MG PO TABS
ORAL_TABLET | ORAL | Status: AC
  Filled 2015-03-26: qty 2

## 2015-03-26 NOTE — Assessment & Plan Note (Signed)
I verify that her code status is DO NOT RESUSCITATE.

## 2015-03-26 NOTE — Assessment & Plan Note (Signed)
This is multifactorial, likely due to defective production and increased consumption from chronic bleeding. No further treatment or transfusion is necessary at this point.

## 2015-03-26 NOTE — Telephone Encounter (Signed)
Per staff message and POF I have scheduled appts. Advised scheduler of appts. JMW  

## 2015-03-26 NOTE — Assessment & Plan Note (Signed)
The absence of significant reticulocytosis in the presence of recent GI bleed suggested component of anemia of chronic disease. Recent serum vitamin B-12 and iron studies were adequate. Serum erythropoietin level is low She responded somewhat to Aranesp injection but continues to require intermittent blood transfusion. Goal is to keep hemoglobin > 10 or blood transfusion as needed to keep hemoglobin greater than 8. We discussed some of the risks, benefits, and alternatives of blood transfusions. The patient is symptomatic from anemia and the hemoglobin level is critically low.  Some of the side-effects to be expected including risks of transfusion reactions, chills, infection, syndrome of volume overload and risk of hospitalization from various reasons and the patient is willing to proceed and went ahead to sign consent today.

## 2015-03-26 NOTE — Telephone Encounter (Signed)
GAVE AND PRITNED APPT SCHED AND AVS FO RPT FOR oct

## 2015-03-26 NOTE — Assessment & Plan Note (Signed)
She has very thin skin and recently developed significant hematoma around the port site. After many weeks of careful dressing changes, she has developed a nice scab over the previous site. Recently, erosion of the port is noted. Due to significant co-morbidities, we would not get that extraction or the port for fear it can cause further skin injury. I recommend she continue dressing changes as needed

## 2015-03-26 NOTE — Progress Notes (Signed)
[place] OFFICE PROGRESS NOTE  Patient Care Team: Prince Solian, MD as PCP - General (Internal Medicine) Gatha Mayer, MD as Consulting Physician (Gastroenterology) Evans Lance, MD as Consulting Physician (Cardiology) Collene Gobble, MD as Consulting Physician (Pulmonary Disease) Jiles Prows, MD as Attending Physician ([place]) Heath Lark, MD as Consulting Physician (Hematology and Oncology) Prince Solian, MD  SUMMARY OF ONCOLOGIC HISTORY:  This patient have remote history of breast cancer diagnosed from screening mammogram in 2003. She had surgery, radiation therapy and adjuvant tamoxifen. 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 She had received B12 injection for B12 deficiency and intravenous iron for iron deficiency anemia. The patient had history of allergic reaction to intravenous iron dextran. She received intravenous iron feraheme in November 1517 without complications. In May 2016, she was found to have melanoma and significant GI bleed. On 11/24/2014, EGD and small bowel endoscopy revealed gastritis, AVM and duodenal mass. Biopsy showed nonmalignant tubular adenoma.Accession: [id number] CT scan of the abdomen dated 11/13/2014 show no retroperitoneal or other acute hemorrhage. Descending colostomy with peristomal hernia containing nonobstructed bowel. Possible cirrhosis She have recurrent admission to the hospital for severe anemia. Her case was recently presented at GI tumor board  Repeat EGD on 12/25/2014 again showed malignant mass but biopsy was benign From 12/17/2014 to present, she received intermittent Aranesp injection and blood transfusion  INTERVAL HISTORY: Please see below for problem oriented charting. She continues to have chronic fatigue. She denies recent infection. She continues to have very thin skin over her port area. There were no reported bleeding. She  have chronic nausea and pain in the epigastric region. She was prescribed Zofran and pain medicine and it has caused recent constipation.  REVIEW OF SYSTEMS:   Constitutional: Denies fevers, chills or abnormal weight loss Eyes: Denies blurriness of vision Ears, nose, mouth, throat, and face: Denies mucositis or sore throat Respiratory: Denies cough, dyspnea or wheezes Cardiovascular: Denies palpitation, chest discomfort or lower extremity swelling Skin: Denies abnormal skin rashes Lymphatics: Denies new lymphadenopathy or easy bruising Neurological:Denies numbness, tingling or new weaknesses Behavioral/Psych: Mood is stable, no new changes  All other systems were reviewed with the patient and are negative.  I have reviewed the past medical history, past surgical history, social history and family history with the patient and they are unchanged from previous note.  ALLERGIES:  is allergic to aspirin; sulfonamide derivatives; ibuprofen; iron dextran; sulfa antibiotics; tape; and metoclopramide hcl.  MEDICATIONS:  Current Outpatient Prescriptions  Medication Sig Dispense Refill  . acetaminophen (TYLENOL) 325 MG tablet Take 650 mg by mouth every 6 (six) hours as needed for mild pain.     [name] albuterol (PROVENTIL) (2.5 MG/3ML) 0.083% nebulizer solution Take 2.5 mg by nebulization every 4 (four) hours as needed for wheezing or shortness of breath.    [name] albuterol (VENTOLIN HFA) 108 (90 BASE) MCG/ACT inhaler Inhale 2 puffs into the lungs every 4 (four) hours as needed for wheezing or shortness of breath.    . ALPRAZolam (XANAX) 0.25 MG tablet Take 0.25 mg by mouth at bedtime.     . baclofen (LIORESAL) 10 MG tablet Take 10 mg by mouth every 8 (eight) hours as needed for muscle spasms.     . beclomethasone (QVAR) 80 MCG/ACT inhaler Inhale 3 puffs into the lungs 2 (two) times daily.     . budesonide (RHINOCORT AQUA) 32 MCG/ACT nasal spray Place 1 spray into  both nostrils 2 (two) times daily.     .  Calcium Carbonate-Vitamin D 600-400 MG-UNIT per tablet Take 1 tablet by mouth daily.    . Carboxymethylcellulose Sodium (LUBRICANT EYE DROPS OP) Apply 2 drops to eye 2 (two) times daily.     . chlorpheniramine-HYDROcodone (TUSSIONEX PENNKINETIC ER) 10-8 MG/5ML SUER Take 5 mLs by mouth every 12 (twelve) hours as needed for cough.    . DEXILANT 60 MG capsule Take 60 mg by mouth daily before breakfast.  3  . DULoxetine (CYMBALTA) 30 MG capsule Take 30 mg by mouth at bedtime.  3  . Eszopiclone 3 MG TABS Take 3 mg by mouth daily as needed (sleep).   2  . furosemide (LASIX) 20 MG tablet Take 20 mg by mouth daily.    [name] guaiFENesin-dextromethorphan (ROBITUSSIN DM) 100-10 MG/5ML syrup Take 15 mLs by mouth every 4 (four) hours as needed for cough.    . hydrocortisone cream 1 % Apply 1 application topically daily as needed for itching.    [name] ketoconazole (NIZORAL) 2 % cream Apply 1 application topically daily as needed. Apply to affected area (rash).  6  . levothyroxine (SYNTHROID, LEVOTHROID) 50 MCG tablet Take 50 mcg by mouth daily.  3  . losartan (COZAAR) 50 MG tablet Take 50 mg by mouth daily.    . metoprolol succinate (TOPROL-XL) 50 MG 24 hr tablet Take 50 mg by mouth daily.  1  . mirtazapine (REMERON) 15 MG tablet Take 15 mg by mouth at bedtime.   3  . Multiple Vitamins-Minerals (MULTIVITAMIN & MINERAL PO) Take 1 tablet by mouth daily.    . mupirocin cream (BACTROBAN) 2 % Apply 1 application topically 2 (two) times daily.    . ondansetron (ZOFRAN-ODT) 4 MG disintegrating tablet Take 4 mg by mouth every 4 (four) hours as needed.    [name] oxyCODONE (OXY IR/ROXICODONE) 5 MG immediate release tablet     . polyethylene glycol (MIRALAX / GLYCOLAX) packet Take 17 g by mouth daily as needed for mild constipation or moderate constipation.    . potassium chloride (MICRO-K) 10 MEQ CR capsule     . pravastatin (PRAVACHOL) 40 MG tablet Take 40 mg by mouth at bedtime.   2  . predniSONE (DELTASONE) 5 MG tablet Take 5 mg  by mouth daily.    . ranitidine (ZANTAC) 300 MG tablet Take 300 mg by mouth at bedtime.  3  . scopolamine (HYOSCINE) 0.25 % ophthalmic solution Place 2 drops into both eyes once.    . senna-docusate (SENOKOT-S) 8.6-50 MG per tablet Take 1 tablet by mouth at bedtime.     . terbinafine (LAMISIL) 1 % cream Apply 1 application topically as needed.    . traZODone (DESYREL) 50 MG tablet Take 50 mg by mouth at bedtime.    . zafirlukast (ACCOLATE) 20 MG tablet Take 20 mg by mouth 2 (two) times daily.  3  . diphenhydrAMINE (BENADRYL) 25 mg capsule Take 25 mg by mouth daily as needed for itching.     [name] EPINEPHrine (EPIPEN 2-PAK) 0.3 mg/0.3 mL IJ SOAJ injection Inject 0.3 mg into the muscle once.    . Morphine Sulfate (MORPHINE CONCENTRATE) 10 mg / 0.5 ml concentrated solution Place 0.25 mLs under the tongue every 2 (two) hours as needed.    . Vitamin D, Ergocalciferol, (DRISDOL) 50000 UNITS CAPS capsule Take 50,000 Units by mouth once a week. Every Sunday  3   Current Facility-Administered Medications  Medication Dose Route Frequency Provider Last  Rate Last Dose  . omalizumab Arvid Right) injection 300 mg  300 mg Subcutaneous Q28 days Jiles Prows, MD        PHYSICAL EXAMINATION: ECOG PERFORMANCE STATUS: 2 - Symptomatic, <50% confined to bed  Filed Vitals:   03/26/15 1034  BP: 113/38  Pulse: 65  Temp: 98.9 F (37.2 C)  Resp: 20   Filed Weights   03/26/15 1034  Weight: 171 lb 1.6 oz (77.61 kg)    GENERAL:alert, no distress and comfortable. She looks debilitated, sitting on the wheelchair SKIN: She has very thin skin. Multiple bruises and noted. The port site area showed an eroded port with scabbing around it EYES: normal, Conjunctiva are pale and non-injected, sclera clear OROPHARYNX:no exudate, no erythema and lips, buccal mucosa, and tongue normal  Musculoskeletal:no cyanosis of digits and no clubbing  NEURO: alert & oriented x 3 with fluent speech, no focal motor/sensory  deficits  LABORATORY DATA:  I have reviewed the data as listed    Component Value Date/Time   NA 138 01/05/2015 1313   NA 136 11/26/2014 0550   K 4.5 01/05/2015 1313   K 3.7 11/26/2014 0550   CL 102 11/26/2014 0550   CO2 24 01/05/2015 1313   CO2 26 11/26/2014 0550   GLUCOSE 107 01/05/2015 1313   GLUCOSE 96 11/26/2014 0550   BUN 28.7* 01/05/2015 1313   BUN 21* 11/26/2014 0550   CREATININE 1.0 01/05/2015 1313   CREATININE 1.12* 11/26/2014 0550   CALCIUM 9.7 01/05/2015 1313   CALCIUM 9.1 11/26/2014 0550   PROT 5.6* 01/05/2015 1313   PROT 4.8* 11/22/2014 0513   ALBUMIN 3.5 01/05/2015 1313   ALBUMIN 3.0* 11/22/2014 0513   AST 21 01/05/2015 1313   AST 21 11/22/2014 0513   ALT 27 01/05/2015 1313   ALT 19 11/22/2014 0513   ALKPHOS 47 01/05/2015 1313   ALKPHOS 36* 11/22/2014 0513   BILITOT 0.80 01/05/2015 1313   BILITOT 1.6* 11/22/2014 0513   GFRNONAA 46* 11/26/2014 0550   GFRAA 53* 11/26/2014 0550    No results found for: SPEP, UPEP  Lab Results  Component Value Date   WBC 7.7 03/26/2015   NEUTROABS 4.3 03/26/2015   HGB 8.0* 03/26/2015   HCT 25.0* 03/26/2015   MCV 90.3 03/26/2015   PLT 54* 03/26/2015      Chemistry      Component Value Date/Time   NA 138 01/05/2015 1313   NA 136 11/26/2014 0550   K 4.5 01/05/2015 1313   K 3.7 11/26/2014 0550   CL 102 11/26/2014 0550   CO2 24 01/05/2015 1313   CO2 26 11/26/2014 0550   BUN 28.7* 01/05/2015 1313   BUN 21* 11/26/2014 0550   CREATININE 1.0 01/05/2015 1313   CREATININE 1.12* 11/26/2014 0550      Component Value Date/Time   CALCIUM 9.7 01/05/2015 1313   CALCIUM 9.1 11/26/2014 0550   ALKPHOS 47 01/05/2015 1313   ALKPHOS 36* 11/22/2014 0513   AST 21 01/05/2015 1313   AST 21 11/22/2014 0513   ALT 27 01/05/2015 1313   ALT 19 11/22/2014 0513   BILITOT 0.80 01/05/2015 1313   BILITOT 1.6* 11/22/2014 0513      ASSESSMENT & PLAN:  Anemia in neoplastic disease The absence of significant reticulocytosis in the  presence of recent GI bleed suggested component of anemia of chronic disease. Recent serum vitamin B-12 and iron studies were adequate. Serum erythropoietin level is low She responded somewhat to Aranesp injection but continues to require intermittent  blood transfusion. Goal is to keep hemoglobin > 10 or blood transfusion as needed to keep hemoglobin greater than 8. We discussed some of the risks, benefits, and alternatives of blood transfusions. The patient is symptomatic from anemia and the hemoglobin level is critically low.  Some of the side-effects to be expected including risks of transfusion reactions, chills, infection, syndrome of volume overload and risk of hospitalization from various reasons and the patient is willing to proceed and went ahead to sign consent today.    Skin lesion She has very thin skin and recently developed significant hematoma around the port site. After many weeks of careful dressing changes, she has developed a nice scab over the previous site. Recently, erosion of the port is noted. Due to significant co-morbidities, we would not get that extraction or the port for fear it can cause further skin injury. I recommend she continue dressing changes as needed   Do not resuscitate status I verify that her code status is DO NOT RESUSCITATE.  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 She has pacemaker at the end of battery life that has not been exchanged because of poor prognosis from multiple comorbidities. The patient has no further wishes in life and just want to be comfortable. She has palliative care provider who wrote her prescription medications for pain, nausea and constipation. She is currently not enroll in hospice because she wants to continue blood transfusion support.  Thrombocytopenia This is multifactorial, likely due to defective production and  increased consumption from chronic bleeding. No further treatment or transfusion is necessary at this point.   No orders of the defined types were placed in this encounter.   All questions were answered. The patient knows to call the clinic with any problems, questions or concerns. No barriers to learning was detected. I spent 20 minutes counseling the patient face to face. The total time spent in the appointment was 25 minutes and more than 50% was on counseling and review of test results     [facility], [place], MD 03/26/2015 1:48 PM

## 2015-03-26 NOTE — Patient Instructions (Signed)
Blood Transfusion  A blood transfusion is a procedure in which you receive donated blood through an IV tube. You may need a blood transfusion because of illness, surgery, or injury. The blood may come from a donor, or it may be your own blood that you donated previously. The blood given in a transfusion is made up of different types of cells. You may receive:  Red blood cells. These carry oxygen and replace lost blood.  Platelets. These control bleeding.  Plasma. Thishelps blood to clot. If you have hemophilia or another clotting disorder, you may also receive other types of blood products. LET [facility] CARE PROVIDER KNOW ABOUT:  Any allergies you have.  All medicines you are taking, including vitamins, herbs, eye drops, creams, and over-the-counter medicines.  Previous problems you or members of your family have had with the use of anesthetics.  Any blood disorders you have.  Previous surgeries you have had.  Any medical conditions you may have.  Any previous reactions you have had during a blood transfusion.  RISKS AND COMPLICATIONS Generally, this is a safe procedure. However, problems may occur, including:  Having an allergic reaction to something in the donated blood.  Fever. This may be a reaction to the white blood cells in the transfused blood.  Iron overload. This can happen from having many transfusions.  Transfusion-related acute lung injury (TRALI). This is a rare reaction that causes lung damage. The cause is not known.TRALI can occur within hours of a transfusion or several days later.  Sudden (acute) or delayed hemolytic reactions. This happens if your blood does not match the cells in your transfusion. Your body's defense system (immune system) may try to attack the new cells. This complication is rare.  Infection. This is rare. BEFORE THE PROCEDURE  You may have a blood test to determine your blood type. This is necessary to know what kind of blood your  body will accept.  If you are going to have a planned surgery, you may donate your own blood. This may be done in case you need to have a transfusion.  If you have had an allergic reaction to a transfusion in the past, you may be given medicine to help prevent a reaction. Take this medicine only as directed by your health care provider.  You will have your temperature, blood pressure, and pulse monitored before the transfusion. PROCEDURE   An IV will be started in your hand or arm.  The bag of donated blood will be attached to your IV tube and given into your vein.  Your temperature, blood pressure, and pulse will be monitored regularly during the transfusion. This monitoring is done to detect early signs of a transfusion reaction.  If you have any signs or symptoms of a reaction, your transfusion will be stopped and you may be given medicine.  When the transfusion is over, your IV will be removed.  Pressure may be applied to the IV site for a few minutes.  A bandage (dressing) will be applied. The procedure may vary among health care providers and hospitals. AFTER THE PROCEDURE  Your blood pressure, temperature, and pulse will be monitored regularly.   This information is not intended to replace advice given to you by your health care provider. Make sure you discuss any questions you have with your health care provider.   Document Released: 06/03/2000 Document Revised: 06/27/2014 Document Reviewed: 04/16/2014 Elsevier Interactive Patient Education 2016 [employer].  Darbepoetin Alfa injection What is this medicine?  DARBEPOETIN ALFA (dar be POE e tin AL fa) helps your body make more red blood cells. It is used to treat anemia caused by chronic kidney failure and chemotherapy. This medicine may be used for other purposes; ask your health care provider or pharmacist if you have questions. What should I tell my health care provider before I take this medicine? They need to know if  you have any of these conditions: -blood clotting disorders or history of blood clots -cancer patient not on chemotherapy -cystic fibrosis -heart disease, such as angina, heart failure, or a history of a heart attack -hemoglobin level of 12 g/dL or greater -high blood pressure -low levels of folate, iron, or vitamin B12 -seizures -an unusual or allergic reaction to darbepoetin, erythropoietin, albumin, hamster proteins, latex, other medicines, foods, dyes, or preservatives -pregnant or trying to get pregnant -breast-feeding How should I use this medicine? This medicine is for injection into a vein or under the skin. It is usually given by a health care professional in a hospital or clinic setting. If you get this medicine at home, you will be taught how to prepare and give this medicine. Do not shake the solution before you withdraw a dose. Use exactly as directed. Take your medicine at regular intervals. Do not take your medicine more often than directed. It is important that you put your used needles and syringes in a special sharps container. Do not put them in a trash can. If you do not have a sharps container, call your pharmacist or healthcare provider to get one. Talk to your pediatrician regarding the use of this medicine in children. While this medicine may be used in children as young as 1 year for selected conditions, precautions do apply. Overdosage: If you think you have taken too much of this medicine contact a poison control center or emergency room at once. NOTE: This medicine is only for you. Do not share this medicine with others. What if I miss a dose? If you miss a dose, take it as soon as you can. If it is almost time for your next dose, take only that dose. Do not take double or extra doses. What may interact with this medicine? Do not take this medicine with any of the following medications: -epoetin alfa This list may not describe all possible interactions. Give your  health care provider a list of all the medicines, herbs, non-prescription drugs, or dietary supplements you use. Also tell them if you smoke, drink alcohol, or use illegal drugs. Some items may interact with your medicine. What should I watch for while using this medicine? Visit your prescriber or health care professional for regular checks on your progress and for the needed blood tests and blood pressure measurements. It is especially important for the doctor to make sure your hemoglobin level is in the desired range, to limit the risk of potential side effects and to give you the best benefit. Keep all appointments for any recommended tests. Check your blood pressure as directed. Ask your doctor what your blood pressure should be and when you should contact him or her. As your body makes more red blood cells, you may need to take iron, folic acid, or vitamin B supplements. Ask your doctor or health care provider which products are right for you. If you have kidney disease continue dietary restrictions, even though this medication can make you feel better. Talk with your doctor or health care professional about the foods you eat and the vitamins  that you take. What side effects may I notice from receiving this medicine? Side effects that you should report to your doctor or health care professional as soon as possible: -allergic reactions like skin rash, itching or hives, swelling of the face, lips, or tongue -breathing problems -changes in vision -chest pain -confusion, trouble speaking or understanding -feeling faint or lightheaded, falls -high blood pressure -muscle aches or pains -pain, swelling, warmth in the leg -rapid weight gain -severe headaches -sudden numbness or weakness of the face, arm or leg -trouble walking, dizziness, loss of balance or coordination -seizures (convulsions) -swelling of the ankles, feet, hands -unusually weak or tired Side effects that usually do not require  medical attention (report to your doctor or health care professional if they continue or are bothersome): -diarrhea -fever, chills (flu-like symptoms) -headaches -nausea, vomiting -redness, stinging, or swelling at site where injected This list may not describe all possible side effects. Call your doctor for medical advice about side effects. You may report side effects to FDA at 1-800-FDA-1088. Where should I keep my medicine? Keep out of the reach of children. Store in a refrigerator between 2 and 8 degrees C (36 and 46 degrees F). Do not freeze. Do not shake. Throw away any unused portion if using a single-dose vial. Throw away any unused medicine after the expiration date. NOTE: This sheet is a summary. It may not cover all possible information. If you have questions about this medicine, talk to your doctor, pharmacist, or health care provider.    2016, Elsevier/[employer] Standard. (2008-05-20 10:23:57)

## 2015-03-26 NOTE — Assessment & Plan Note (Signed)
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 She has pacemaker at the end of battery life that has not been exchanged because of poor prognosis from multiple comorbidities. The patient has no further wishes in life and just want to be comfortable. She has palliative care provider who wrote her prescription medications for pain, nausea and constipation. She is currently not enroll in hospice because she wants to continue blood transfusion support.

## 2015-03-27 LAB — TYPE AND SCREEN
ABO/RH(D): A POS
Antibody Screen: NEGATIVE
UNIT DIVISION: 0
UNIT DIVISION: 0

## 2015-03-31 ENCOUNTER — Encounter: Payer: Self-pay | Admitting: Allergy and Immunology

## 2015-03-31 ENCOUNTER — Ambulatory Visit (INDEPENDENT_AMBULATORY_CARE_PROVIDER_SITE_OTHER): Payer: Medicare Other | Admitting: Allergy and Immunology

## 2015-03-31 VITALS — BP 136/58 | HR 62 | Resp 18

## 2015-03-31 DIAGNOSIS — J3089 Other allergic rhinitis: Secondary | ICD-10-CM | POA: Diagnosis not present

## 2015-03-31 DIAGNOSIS — J479 Bronchiectasis, uncomplicated: Secondary | ICD-10-CM | POA: Diagnosis not present

## 2015-03-31 DIAGNOSIS — J4540 Moderate persistent asthma, uncomplicated: Secondary | ICD-10-CM

## 2015-03-31 DIAGNOSIS — J387 Other diseases of larynx: Secondary | ICD-10-CM | POA: Diagnosis not present

## 2015-03-31 DIAGNOSIS — K219 Gastro-esophageal reflux disease without esophagitis: Secondary | ICD-10-CM

## 2015-03-31 MED ORDER — EPINEPHRINE 0.3 MG/0.3ML IJ SOAJ: INTRAMUSCULAR | Status: AC

## 2015-03-31 NOTE — Patient Instructions (Signed)
  1. Continue Xolair 150mg  every 4 weeks with Epi-Pen on hand  2. Continue Qvar 80 three puffs two times per day  3. Continue prednisone 5mg  daily  4. Continue accolate 20mg  one tablet two times per day  5. Continue Rhinocort one - two sprays each nostril one time per day  6. Continue Dexilant 60mg  in AM and Ranitidine 300mg  in PM  7. Use Proair Hfa and albuterol nebulization if needed.  8. Get a flu vaccine  9. Return in January or earlier if problem/

## 2015-03-31 NOTE — Progress Notes (Signed)
[facility]  Follow-up Note    Subjective:   [name] is a 81 y.o. female who returns to the [facility] in re-evaluation of the following:  HPI Comments:  [name] presents today noting that her asthma has been under very good control without the need for additional systemic steroids above her chronic prednisone 5mg  daily dose. She is presently using her Qvar 80 three inhalations twice daily and does usually need to use her SABA twice daily. She continues on her omalizumab 150mg  monthly. She is limited in her ability to exercise so it is difficult to assess for exercise induced symptoms. She does not have any nocturnal bronchospastic symptoms. Her reflux is OK but she is suffering from nausea that fortunately has been successfully treated with zofran. 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 At this point [name] is in assisted living and has elected to not have an invasive procedures performed. Overall, she is in good spirits today and has a very thorough understanding of her medical conditions.    Current outpatient prescriptions:  .  acetaminophen (TYLENOL) 325 MG tablet, Take 650 mg by mouth every 6 (six) hours as needed for mild pain. , Disp: , Rfl:  .  albuterol (PROVENTIL) (2.5 MG/3ML) 0.083% nebulizer solution, Take 2.5 mg by nebulization every 4 (four) hours as needed for wheezing or shortness of breath., Disp: , Rfl:  .  albuterol (VENTOLIN HFA) 108 (90 BASE) MCG/ACT inhaler, Inhale 2 puffs into the lungs every 4 (four) hours as needed for wheezing or shortness of breath., Disp: , Rfl:  .  ALPRAZolam (XANAX) 0.25 MG tablet, Take 0.25 mg by mouth at bedtime. , Disp: , Rfl:  .  baclofen  (LIORESAL) 10 MG tablet, Take 10 mg by mouth every 8 (eight) hours as needed for muscle spasms. , Disp: , Rfl:  .  beclomethasone (QVAR) 80 MCG/ACT inhaler, Inhale 3 puffs into the lungs 2 (two) times daily. , Disp: , Rfl:  .  budesonide (RHINOCORT AQUA) 32 MCG/ACT nasal spray, Place 1 spray into both nostrils 2 (two) times daily. , Disp: , Rfl:  .  Calcium Carbonate-Vitamin D 600-400 MG-UNIT per tablet, Take 1 tablet by mouth daily., Disp: , Rfl:  .  Carboxymethylcellulose Sodium (LUBRICANT EYE DROPS OP), Apply 2 drops to eye 2 (two) times daily. , Disp: , Rfl:  .  chlorpheniramine-HYDROcodone (TUSSIONEX PENNKINETIC ER) 10-8 MG/5ML SUER, Take 5 mLs by mouth every 12 (twelve) hours as needed for cough., Disp: , Rfl:  .  DEXILANT 60 MG capsule, Take 60 mg by mouth daily before breakfast., Disp: , Rfl: 3 .  diphenhydrAMINE (BENADRYL) 25 mg capsule, Take 25 mg by mouth daily as needed for itching. , Disp: , Rfl:  .  DULoxetine (CYMBALTA) 30 MG capsule, Take 30 mg by mouth at bedtime., Disp: , Rfl: 3 .  EPINEPHrine (EPIPEN 2-PAK) 0.3 mg/0.3 mL IJ SOAJ injection, USE AS DIRECTED FOR LIFE THREATENING ALLERGIC REACTIONS, Disp: 2 Device, Rfl: 3 .  Eszopiclone 3 MG TABS, Take 3 mg by mouth daily as needed (sleep). , Disp: , Rfl: 2 .  furosemide (LASIX) 20 MG tablet, Take 20 mg by mouth daily., Disp: , Rfl:  .  guaiFENesin-dextromethorphan (ROBITUSSIN DM) 100-10 MG/5ML syrup,  Take 15 mLs by mouth every 4 (four) hours as needed for cough., Disp: , Rfl:  .  hydrocortisone cream 1 %, Apply 1 application topically daily as needed for itching., Disp: , Rfl:  .  ketoconazole (NIZORAL) 2 % cream, Apply 1 application topically daily as needed. Apply to affected area (rash)., Disp: , Rfl: 6 .  levothyroxine (SYNTHROID, LEVOTHROID) 50 MCG tablet, Take 50 mcg by mouth daily., Disp: , Rfl: 3 .  losartan (COZAAR) 50 MG tablet, Take 50 mg by mouth daily., Disp: , Rfl:  .  metoprolol succinate (TOPROL-XL) 50 MG 24 hr  tablet, Take 50 mg by mouth daily., Disp: , Rfl: 1 .  mirtazapine (REMERON) 15 MG tablet, Take 15 mg by mouth at bedtime. , Disp: , Rfl: 3 .  Morphine Sulfate (MORPHINE CONCENTRATE) 10 mg / 0.5 ml concentrated solution, Place 0.25 mLs under the tongue every 2 (two) hours as needed., Disp: , Rfl:  .  Multiple Vitamins-Minerals (MULTIVITAMIN & MINERAL PO), Take 1 tablet by mouth daily., Disp: , Rfl:  .  mupirocin cream (BACTROBAN) 2 %, Apply 1 application topically 2 (two) times daily., Disp: , Rfl:  .  ondansetron (ZOFRAN-ODT) 4 MG disintegrating tablet, Take 4 mg by mouth every 4 (four) hours as needed., Disp: , Rfl:  .  oxyCODONE (OXY IR/ROXICODONE) 5 MG immediate release tablet, , Disp: , Rfl:  .  polyethylene glycol (MIRALAX / GLYCOLAX) packet, Take 17 g by mouth daily as needed for mild constipation or moderate constipation., Disp: , Rfl:  .  potassium chloride (MICRO-K) 10 MEQ CR capsule, , Disp: , Rfl:  .  pravastatin (PRAVACHOL) 40 MG tablet, Take 40 mg by mouth at bedtime. , Disp: , Rfl: 2 .  predniSONE (DELTASONE) 5 MG tablet, Take 5 mg by mouth daily., Disp: , Rfl:  .  ranitidine (ZANTAC) 300 MG tablet, Take 300 mg by mouth at bedtime., Disp: , Rfl: 3 .  scopolamine (HYOSCINE) 0.25 % ophthalmic solution, Place 2 drops into both eyes once., Disp: , Rfl:  .  senna-docusate (SENOKOT-S) 8.6-50 MG per tablet, Take 1 tablet by mouth at bedtime. , Disp: , Rfl:  .  terbinafine (LAMISIL) 1 % cream, Apply 1 application topically as needed., Disp: , Rfl:  .  traZODone (DESYREL) 50 MG tablet, Take 50 mg by mouth at bedtime., Disp: , Rfl:  .  Vitamin D, Ergocalciferol, (DRISDOL) 50000 UNITS CAPS capsule, Take 50,000 Units by mouth once a week. Every Sunday, Disp: , Rfl: 3 .  zafirlukast (ACCOLATE) 20 MG tablet, Take 20 mg by mouth 2 (two) times daily., Disp: , Rfl: 3  Current facility-administered medications:  .  omalizumab Arvid Right) injection 300 mg, 300 mg, Subcutaneous, Q28 days, Jiles Prows,  MD, 300 mg at 03/31/15 1142  Meds ordered this encounter  Medications  . EPINEPHrine (EPIPEN 2-PAK) 0.3 mg/0.3 mL IJ SOAJ injection    Sig: USE AS DIRECTED FOR LIFE THREATENING ALLERGIC REACTIONS    Dispense:  2 Device    Refill:  3    Past Medical History  Diagnosis Date  . Asthma   . Presence of permanent cardiac pacemaker   . Hypertension   . Hypothyroidism   . Anxiety   . Cancer (HCC)     breast; finger  . PONV (postoperative nausea and vomiting)   . Insomnia, unspecified   . Osteoarthrosis, unspecified whether generalized or localized, unspecified site   . Unspecified hypothyroidism   . Unspecified essential hypertension   .  Atrial fibrillation ([place])   . Sciatica   . Anemia   . COPD (chronic obstructive pulmonary disease) ([place])   . UTI (urinary tract infection)   . GERD (gastroesophageal reflux disease)   . Pneumonia   . Asthma   . Clostridium difficile colitis   . Diverticulitis of sigmoid colon 11/2010    with abscess/contained perforation  . Dementia     mild  . Dehydration with hyponatremia 12/2010    hospitalized  . Angiodysplasia of stomach     ablated 2010  . Angiodysplasia of colon     ablated 2010  . Vitamin B12 deficiency (dietary) anemia 04/12/2011  . Shingles   . 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 Malignant neoplasm of breast (female), unspecified site     left  . Dupuytren's disease     left hand  . Vitamin B12 deficiency 04/02/2012  . Complication of anesthesia     " very cautious because of my COPD"  . Pacemaker   . Gastric mass 12/02/2014  . Renal insufficiency 11/21/2014  . History of oxygen administration     12-17-14 using oxygen 2 l/m nasally bedtime always and if needed with exertion  . Transfusion history     12-17-14- last transfusion 12-03-14   . Skin lesion 01/13/2015    Past Surgical History  Procedure Laterality Date  . Breast surgery      left breast mammosite surgery  . Insert / replace / remove pacemaker    . Enteroscopy N/A  11/24/2014    Procedure: ENTEROSCOPY;  Surgeon: Ladene Artist, MD;  Location: [facility] ENDOSCOPY;  Service: Endoscopy;  Laterality: N/A;  . Colectomy with colostomy creation/hartmann procedure  2013  . Abdominal wound dehiscence      stratus mesh placed with g-tube  . Nasal sinus surgery      x 3  . Appendectomy    . Hernia repair    . Abdominal hysterectomy    . Pacemaker insertion    . Eye surgery      x6-bilateral  . Finger amputation      right  . Foot surgery      right toes x 3  . Tonsillectomy    . Colonoscopy  01/2009    w/ablation of 3 mm AVM, diverticulosis, external hemorrhoids  . Upper gastrointestinal endoscopy  01/2009    w/polypectomy, AVM, hyperplastic polyp  . Abdominal hysterectomy    . Laparotomy  11/12/2011    Procedure: EXPLORATORY LAPAROTOMY;  Surgeon: Madilyn Hook, DO;  Location: [facility] ORS;  Service: General;  Laterality: N/A;  . Colostomy revision  11/12/2011    Procedure: COLON RESECTION SIGMOID;  Surgeon: Madilyn Hook, DO;  Location: [facility] ORS;  Service: General;  Laterality: N/A;  . Colostomy  11/12/2011    Procedure: COLOSTOMY;  Surgeon: Madilyn Hook, DO;  Location: [facility] ORS;  Service: General;  Laterality: Left;  End colostomy  . Laparotomy  11/19/2011    Procedure: EXPLORATORY LAPAROTOMY;  Surgeon: Shann Medal, MD;  Location: [facility] ORS;  Service: General;  Laterality: N/A;  . Gastrostomy  11/19/2011    Procedure: GASTROSTOMY;  Surgeon: Shann Medal, MD;  Location: [facility] ORS;  Service: General;;  INSERTION GASTROSTOMY TUBE  . Application of wound vac  11/19/2011    Procedure: APPLICATION OF WOUND VAC;  Surgeon: Shann Medal, MD;  Location: [facility] ORS;  Service: General;  Laterality: N/A;  . Colon surgery    . Esophagogastroduodenoscopy (egd) with propofol N/A 12/25/2014  Procedure: ESOPHAGOGASTRODUODENOSCOPY (EGD) WITH PROPOFOL;  Surgeon: Milus Banister, MD;  Location: [facility] ENDOSCOPY;  Service: Endoscopy;  Laterality: N/A;    Allergies  Allergen Reactions  . Aspirin  Shortness Of Breath    Exacerbates asthma  . Sulfonamide Derivatives Hives  . Ibuprofen Other (See Comments)    Doesn't take because of her aspirin allergy  . Iron Dextran Itching     itching after test dose  . Sulfa Antibiotics Hives  . Tape Other (See Comments)    Prefers "paper" tape (very thin skin-clear tape rips skin).  . Metoclopramide Hcl Other (See Comments)    REACTION: chomping mouth    Review of Systems  Constitutional: Negative for fever, chills and weight loss.  HENT: Negative for congestion, ear discharge, nosebleeds and sore throat.   Eyes: Negative for blurred vision, discharge and redness.  Respiratory: Positive for cough and shortness of breath. Negative for sputum production and wheezing.   Cardiovascular: Negative for chest pain.  Gastrointestinal: Positive for nausea. Negative for heartburn and vomiting.  Skin: Negative for itching and rash.  Neurological: Positive for weakness.     Objective:   Filed Vitals:   03/31/15 1147  BP: 136/58  Pulse: 62  Resp: 18    Physical Exam  Constitutional: She is well-developed, well-nourished, and in no distress. No distress.  HENT:  Head: Normocephalic and atraumatic. Head is without right periorbital erythema and without left periorbital erythema.  Right Ear: Tympanic membrane, external ear and ear canal normal. No drainage. No foreign bodies. Tympanic membrane is not injected, not scarred, not perforated, not erythematous, not retracted and not bulging. No middle ear effusion.  Left Ear: Tympanic membrane, external ear and ear canal normal. No drainage. No foreign bodies. Tympanic membrane is not injected, not scarred, not perforated, not erythematous, not retracted and not bulging.  No middle ear effusion.  Nose: Nose normal. No mucosal edema, rhinorrhea, nose lacerations, sinus tenderness, nasal deformity, septal deviation or nasal septal hematoma. No epistaxis.  Mouth/Throat: Oropharynx is clear and moist and  mucous membranes are normal. No oropharyngeal exudate, posterior oropharyngeal edema, posterior oropharyngeal erythema or tonsillar abscesses.  Eyes: Conjunctivae and lids are normal. Pupils are equal, round, and reactive to light. Right eye exhibits no discharge and no exudate. No foreign body present in the right eye. Left eye exhibits no discharge and no exudate. No foreign body present in the left eye. Right conjunctiva is not injected. Right conjunctiva has no hemorrhage. Left conjunctiva is not injected. Left conjunctiva has no hemorrhage. No scleral icterus.  Neck: No tracheal tenderness present. No tracheal deviation present. No thyromegaly present.  Cardiovascular: Normal rate, regular rhythm, S1 normal and S2 normal.  Exam reveals no gallop and no friction rub.   Murmur heard. Pulmonary/Chest: Effort normal. No stridor. No respiratory distress. She has no wheezes. She has no rhonchi. She has no rales. She exhibits no tenderness.  Lymphadenopathy:    She has no cervical adenopathy.  Skin: No purpura and no rash noted. Rash is not macular, not maculopapular, not nodular, not pustular, not vesicular and not urticarial. She is not diaphoretic. No cyanosis or erythema. No pallor. Nails show no clubbing.  Psychiatric: Mood, affect and judgment normal.    Diagnostics:    Spirometry was performed and demonstrated an FEV1 of 1.05 at 50 % of predicted.  The patient had an Asthma Control Test with the following results: ACT Total Score: 15.    Assessment and Plan:   1. Other allergic  rhinitis   2. Moderate persistent asthma, uncomplicated   3. LPRD (laryngopharyngeal reflux disease)   4. Bronchiectasis without complication ([facility])        1. Continue Xolair 150mg  every 4 weeks with Epi-Pen on hand  2. Continue Qvar 80 three puffs two times per day  3. Continue prednisone 5mg  daily  4. Continue accolate 20mg  one tablet two times per day  5. Continue Rhinocort one - two sprays each  nostril one time per day  6. Continue Dexilant 60mg  in AM and Ranitidine 300mg  in PM  7. Use Proair Hfa and albuterol nebulization if needed.  8. Get a flu vaccine  9. Return in January or earlier if problem/    Allena Katz, MD [place]

## 2015-03-31 NOTE — Progress Notes (Signed)
This encounter was created in error - please disregard.

## 2015-04-06 ENCOUNTER — Encounter: Payer: Self-pay | Admitting: Hematology and Oncology

## 2015-04-06 ENCOUNTER — Ambulatory Visit: Payer: Medicare Other

## 2015-04-06 ENCOUNTER — Ambulatory Visit (HOSPITAL_BASED_OUTPATIENT_CLINIC_OR_DEPARTMENT_OTHER): Payer: Medicare Other | Admitting: Hematology and Oncology

## 2015-04-06 ENCOUNTER — Other Ambulatory Visit (HOSPITAL_BASED_OUTPATIENT_CLINIC_OR_DEPARTMENT_OTHER): Payer: Medicare Other

## 2015-04-06 VITALS — BP 118/46 | HR 65 | Temp 98.6°F | Resp 18 | Ht 64.5 in | Wt 169.7 lb

## 2015-04-06 DIAGNOSIS — D61818 Other pancytopenia: Secondary | ICD-10-CM

## 2015-04-06 DIAGNOSIS — Z66 Do not resuscitate: Secondary | ICD-10-CM

## 2015-04-06 DIAGNOSIS — D509 Iron deficiency anemia, unspecified: Secondary | ICD-10-CM

## 2015-04-06 LAB — CBC & DIFF AND RETIC
HEMATOCRIT: 27.4 % — AB (ref 34.8–46.6)
HEMOGLOBIN: 8.8 g/dL — AB (ref 11.6–15.9)
IMMATURE RETIC FRACT: 11.1 % — AB (ref 1.60–10.00)
MCH: 28.5 pg (ref 25.1–34.0)
MCHC: 32.1 g/dL (ref 31.5–36.0)
MCV: 88.7 fL (ref 79.5–101.0)
PLATELETS: 47 10*3/uL — AB (ref 145–400)
RBC: 3.09 10*6/uL — ABNORMAL LOW (ref 3.70–5.45)
RDW: 16.6 % — ABNORMAL HIGH (ref 11.2–14.5)
RETIC CT ABS: 79.1 10*3/uL (ref 33.70–90.70)
Retic %: 2.56 % — ABNORMAL HIGH (ref 0.70–2.10)
WBC: 7.1 10*3/uL (ref 3.9–10.3)

## 2015-04-06 LAB — HOLD TUBE, BLOOD BANK

## 2015-04-06 LAB — MANUAL DIFFERENTIAL
ALC: 0.8 10*3/uL — ABNORMAL LOW (ref 0.9–3.3)
ANC (CHCC MAN DIFF): 3.6 10*3/uL (ref 1.5–6.5)
BAND NEUTROPHILS: 15 % — AB (ref 0–10)
BLASTS: 25 % — AB (ref 0–0)
LYMPH: 12 % — ABNORMAL LOW (ref 14–49)
METAMYELOCYTES PCT: 8 % — AB (ref 0–0)
MONO: 9 % (ref 0–14)
MYELOCYTES: 14 % — AB (ref 0–0)
PLT EST: DECREASED
PROMYELO: 3 % — AB (ref 0–0)
SEG: 14 % — ABNORMAL LOW (ref 38–77)

## 2015-04-07 DIAGNOSIS — D61818 Other pancytopenia: Secondary | ICD-10-CM | POA: Insufficient documentation

## 2015-04-07 NOTE — Assessment & Plan Note (Signed)
She have progressive pancytopenia. I reviewed the peripheral blood and saw circulating blasts. 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 This will confer to very very poor prognosis. I discussed with the patient discontinuation of blood transfusion and to enroll in hospice. The patient is sad to know of this additional diagnosis but agreed that her overall prognosis is very poor and she agreed to be enrolled into hospice. She will make contact with the hospice physician. I will not prescribe blood or platelet transfusion for her. I have also discussed with her daughter about this

## 2015-04-07 NOTE — Assessment & Plan Note (Signed)
I verify that her code status is DO NOT RESUSCITATE.

## 2015-04-07 NOTE — Progress Notes (Signed)
[place] OFFICE PROGRESS NOTE  Patient Care Team: Prince Solian, MD as PCP - General (Internal Medicine) Gatha Mayer, MD as Consulting Physician (Gastroenterology) Evans Lance, MD as Consulting Physician (Cardiology) Collene Gobble, MD as Consulting Physician (Pulmonary Disease) Jiles Prows, MD as Attending Physician ([place]) Heath Lark, MD as Consulting Physician (Hematology and Oncology) Prince Solian, MD  SUMMARY OF ONCOLOGIC HISTORY:  This patient have remote history of breast cancer diagnosed from screening mammogram in 2003. She had surgery, radiation therapy and adjuvant tamoxifen. 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 She had received B12 injection for B12 deficiency and intravenous iron for iron deficiency anemia. The patient had history of allergic reaction to intravenous iron dextran. She received intravenous iron feraheme in November 8299 without complications. In May 2016, she was found to have melanoma and significant GI bleed. On 11/24/2014, EGD and small bowel endoscopy revealed gastritis, AVM and duodenal mass. Biopsy showed nonmalignant tubular adenoma.Accession: [id number] CT scan of the abdomen dated 11/13/2014 show no retroperitoneal or other acute hemorrhage. Descending colostomy with peristomal hernia containing nonobstructed bowel. Possible cirrhosis She have recurrent admission to the hospital for severe anemia. Her case was recently presented at GI tumor board  Repeat EGD on 12/25/2014 again showed malignant mass but biopsy was benign From 12/17/2014 to present, she received intermittent Aranesp injection and blood transfusion  INTERVAL HISTORY: Please see below for problem oriented charting. She feels tired. She denies any worsening pain at the port site. The wound dressing has been changed to every other day. She had severe asthma attack today before,  resolved. She denies fevers or chills. The patient denies any recent signs or symptoms of bleeding such as spontaneous epistaxis, hematuria or hematochezia.  REVIEW OF SYSTEMS:   Constitutional: Denies fevers, chills or abnormal weight loss Eyes: Denies blurriness of vision Ears, nose, mouth, throat, and face: Denies mucositis or sore throat Cardiovascular: Denies palpitation, chest discomfort or lower extremity swelling Gastrointestinal:  Denies nausea, heartburn or change in bowel habits Lymphatics: Denies new lymphadenopathy or easy bruising Neurological:Denies numbness, tingling or new weaknesses Behavioral/Psych: Mood is stable, no new changes  All other systems were reviewed with the patient and are negative.  I have reviewed the past medical history, past surgical history, social history and family history with the patient and they are unchanged from previous note.  ALLERGIES:  is allergic to aspirin; sulfonamide derivatives; ibuprofen; iron dextran; sulfa antibiotics; tape; and metoclopramide hcl.  MEDICATIONS:  Current Outpatient Prescriptions  Medication Sig Dispense Refill  . acetaminophen (TYLENOL) 325 MG tablet Take 650 mg by mouth every 6 (six) hours as needed for mild pain.     [name] albuterol (PROVENTIL) (2.5 MG/3ML) 0.083% nebulizer solution Take 2.5 mg by nebulization every 4 (four) hours as needed for wheezing or shortness of breath.    [name] albuterol (VENTOLIN HFA) 108 (90 BASE) MCG/ACT inhaler Inhale 2 puffs into the lungs every 4 (four) hours as needed for wheezing or shortness of breath.    . ALPRAZolam (XANAX) 0.25 MG tablet Take 0.25 mg by mouth at bedtime.     . baclofen (LIORESAL) 10 MG tablet Take 10 mg by mouth every 8 (eight) hours as needed for muscle spasms.     . beclomethasone (QVAR) 80 MCG/ACT inhaler Inhale 3 puffs into the lungs 2 (two) times daily.     . budesonide (RHINOCORT AQUA) 32 MCG/ACT nasal spray Place 1  spray into both nostrils 2 (two) times daily.      . Calcium Carbonate-Vitamin D 600-400 MG-UNIT per tablet Take 1 tablet by mouth daily.    . Carboxymethylcellulose Sodium (LUBRICANT EYE DROPS OP) Apply 2 drops to eye 2 (two) times daily.     . chlorpheniramine-HYDROcodone (TUSSIONEX PENNKINETIC ER) 10-8 MG/5ML SUER Take 5 mLs by mouth every 12 (twelve) hours as needed for cough.    . DEXILANT 60 MG capsule Take 60 mg by mouth daily before breakfast.  3  . diphenhydrAMINE (BENADRYL) 25 mg capsule Take 25 mg by mouth daily as needed for itching.     . DULoxetine (CYMBALTA) 30 MG capsule Take 30 mg by mouth at bedtime.  3  . EPINEPHrine (EPIPEN 2-PAK) 0.3 mg/0.3 mL IJ SOAJ injection USE AS DIRECTED FOR LIFE THREATENING ALLERGIC REACTIONS 2 Device 3  . Eszopiclone 3 MG TABS Take 3 mg by mouth daily as needed (sleep).   2  . furosemide (LASIX) 20 MG tablet Take 20 mg by mouth daily.    [name] guaiFENesin-dextromethorphan (ROBITUSSIN DM) 100-10 MG/5ML syrup Take 15 mLs by mouth every 4 (four) hours as needed for cough.    . hydrocortisone cream 1 % Apply 1 application topically daily as needed for itching.    [name] ketoconazole (NIZORAL) 2 % cream Apply 1 application topically daily as needed. Apply to affected area (rash).  6  . levothyroxine (SYNTHROID, LEVOTHROID) 50 MCG tablet Take 50 mcg by mouth daily.  3  . losartan (COZAAR) 50 MG tablet Take 50 mg by mouth daily.    . metoprolol succinate (TOPROL-XL) 50 MG 24 hr tablet Take 50 mg by mouth daily.  1  . mirtazapine (REMERON) 15 MG tablet Take 15 mg by mouth at bedtime.   3  . Morphine Sulfate (MORPHINE CONCENTRATE) 10 mg / 0.5 ml concentrated solution Place 0.25 mLs under the tongue every 2 (two) hours as needed.    . Multiple Vitamins-Minerals (MULTIVITAMIN & MINERAL PO) Take 1 tablet by mouth daily.    . mupirocin cream (BACTROBAN) 2 % Apply 1 application topically 2 (two) times daily.    . ondansetron (ZOFRAN-ODT) 4 MG disintegrating tablet Take 4 mg by mouth every 4 (four) hours as needed.    [name]  oxyCODONE (OXY IR/ROXICODONE) 5 MG immediate release tablet     . polyethylene glycol (MIRALAX / GLYCOLAX) packet Take 17 g by mouth daily as needed for mild constipation or moderate constipation.    . potassium chloride (MICRO-K) 10 MEQ CR capsule     . pravastatin (PRAVACHOL) 40 MG tablet Take 40 mg by mouth at bedtime.   2  . predniSONE (DELTASONE) 5 MG tablet Take 5 mg by mouth daily.    . ranitidine (ZANTAC) 300 MG tablet Take 300 mg by mouth at bedtime.  3  . scopolamine (HYOSCINE) 0.25 % ophthalmic solution Place 2 drops into both eyes once.    . senna-docusate (SENOKOT-S) 8.6-50 MG per tablet Take 1 tablet by mouth at bedtime.     . terbinafine (LAMISIL) 1 % cream Apply 1 application topically as needed.    . traZODone (DESYREL) 50 MG tablet Take 50 mg by mouth at bedtime.    . Vitamin D, Ergocalciferol, (DRISDOL) 50000 UNITS CAPS capsule Take 50,000 Units by mouth once a week. Every Sunday  3  . zafirlukast (ACCOLATE) 20 MG tablet Take 20 mg by mouth 2 (two) times daily.  3   Current Facility-Administered Medications  Medication Dose  Route Frequency Provider Last Rate Last Dose  . omalizumab Arvid Right) injection 300 mg  300 mg Subcutaneous Q28 days Jiles Prows, MD   300 mg at 03/31/15 1142    PHYSICAL EXAMINATION: ECOG PERFORMANCE STATUS: 2 - Symptomatic, <50% confined to bed  Filed Vitals:   04/06/15 1108  BP: 118/46  Pulse: 65  Temp: 98.6 F (37 C)  Resp: 18   Filed Weights   04/06/15 1108  Weight: 169 lb 11.2 oz (76.975 kg)    GENERAL:alert, no distress and comfortable SKIN: She has thin skin and diffuse ecchymosis. EYES: normal, Conjunctiva are pale and non-injected, sclera clear Musculoskeletal:no cyanosis of digits and no clubbing  NEURO: alert & oriented x 3 with fluent speech, no focal motor/sensory deficits  LABORATORY DATA:  I have reviewed the data as listed    Component Value Date/Time   NA 138 01/05/2015 1313   NA 136 11/26/2014 0550   K 4.5  01/05/2015 1313   K 3.7 11/26/2014 0550   CL 102 11/26/2014 0550   CO2 24 01/05/2015 1313   CO2 26 11/26/2014 0550   GLUCOSE 107 01/05/2015 1313   GLUCOSE 96 11/26/2014 0550   BUN 28.7* 01/05/2015 1313   BUN 21* 11/26/2014 0550   CREATININE 1.0 01/05/2015 1313   CREATININE 1.12* 11/26/2014 0550   CALCIUM 9.7 01/05/2015 1313   CALCIUM 9.1 11/26/2014 0550   PROT 5.6* 01/05/2015 1313   PROT 4.8* 11/22/2014 0513   ALBUMIN 3.5 01/05/2015 1313   ALBUMIN 3.0* 11/22/2014 0513   AST 21 01/05/2015 1313   AST 21 11/22/2014 0513   ALT 27 01/05/2015 1313   ALT 19 11/22/2014 0513   ALKPHOS 47 01/05/2015 1313   ALKPHOS 36* 11/22/2014 0513   BILITOT 0.80 01/05/2015 1313   BILITOT 1.6* 11/22/2014 0513   GFRNONAA 46* 11/26/2014 0550   GFRAA 53* 11/26/2014 0550    No results found for: SPEP, UPEP  Lab Results  Component Value Date   WBC 7.1 04/06/2015   NEUTROABS 4.3 03/26/2015   HGB 8.8* 04/06/2015   HCT 27.4* 04/06/2015   MCV 88.7 04/06/2015   PLT 47* 04/06/2015      Chemistry      Component Value Date/Time   NA 138 01/05/2015 1313   NA 136 11/26/2014 0550   K 4.5 01/05/2015 1313   K 3.7 11/26/2014 0550   CL 102 11/26/2014 0550   CO2 24 01/05/2015 1313   CO2 26 11/26/2014 0550   BUN 28.7* 01/05/2015 1313   BUN 21* 11/26/2014 0550   CREATININE 1.0 01/05/2015 1313   CREATININE 1.12* 11/26/2014 0550      Component Value Date/Time   CALCIUM 9.7 01/05/2015 1313   CALCIUM 9.1 11/26/2014 0550   ALKPHOS 47 01/05/2015 1313   ALKPHOS 36* 11/22/2014 0513   AST 21 01/05/2015 1313   AST 21 11/22/2014 0513   ALT 27 01/05/2015 1313   ALT 19 11/22/2014 0513   BILITOT 0.80 01/05/2015 1313   BILITOT 1.6* 11/22/2014 0513     I reviewed the peripheral smear and noticed absolute thrombocytopenia. Circulating blasts are seen.  ASSESSMENT & PLAN:   Pancytopenia, acquired ([place]) She have progressive pancytopenia. I reviewed the peripheral blood and saw circulating blasts. 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 This will confer to very very poor prognosis. I discussed with the patient discontinuation of blood transfusion and to enroll in hospice. The patient is sad  to know of this additional diagnosis but agreed that her overall prognosis is very poor and she agreed to be enrolled into hospice. She will make contact with the hospice physician. I will not prescribe blood or platelet transfusion for her. I have also discussed with her daughter about this  Do not resuscitate status I verify that her code status is DO NOT RESUSCITATE.       All questions were answered. The patient knows to call the clinic with any problems, questions or concerns. No barriers to learning was detected. I spent 25 minutes counseling the patient face to face. The total time spent in the appointment was 30 minutes and more than 50% was on counseling and review of test results     [facility], [place], MD 04/07/2015 4:23 PM

## 2015-04-09 ENCOUNTER — Telehealth: Payer: Self-pay | Admitting: *Deleted

## 2015-04-09 NOTE — Telephone Encounter (Signed)
Faxed office note to [facility] per patient's request

## 2015-04-23 ENCOUNTER — Telehealth: Payer: Self-pay

## 2015-04-23 ENCOUNTER — Other Ambulatory Visit: Payer: Self-pay | Admitting: Neurology

## 2015-04-23 MED ORDER — BECLOMETHASONE DIPROPIONATE 80 MCG/ACT IN AERS: 3.0000 | INHALATION_SPRAY | Freq: Two times a day (BID) | RESPIRATORY_TRACT | Status: DC

## 2015-04-23 MED ORDER — BECLOMETHASONE DIPROPIONATE 80 MCG/ACT IN AERS: 3.0000 | INHALATION_SPRAY | Freq: Two times a day (BID) | RESPIRATORY_TRACT | Status: AC

## 2015-04-23 NOTE — Telephone Encounter (Signed)
PATIENT CALLED NEEDING A REFILL ON HER QVAR TO [facility] ON [address] IN [place]. SHE CALLED THE PHARMACY TO GET A REFILL AND WAS TOLD SHE DIDN'T HAVE ANY MORE REFILLS.  DOL VISIT 12/19/2014 [place]  PLEASE ADVISE   THANKS

## 2015-04-23 NOTE — Telephone Encounter (Signed)
Sent in refill of Qvar [facility]mcg to [facility] in [place] and patient notified.

## 2015-05-08 ENCOUNTER — Other Ambulatory Visit: Payer: Self-pay | Admitting: Hematology and Oncology

## 2015-05-21 DEATH — deceased

## 2015-09-10 ENCOUNTER — Ambulatory Visit: Payer: Medicare Other | Admitting: Hematology and Oncology

## 2015-09-10 ENCOUNTER — Other Ambulatory Visit: Payer: Medicare Other

## 2016-10-31 IMAGING — CR DG CHEST 1V
1 series · 1 of 1 positions shown · non-contrast
Comparison: None.

CLINICAL DATA: Central line placement

EXAM:
CHEST  1 VIEW

[AP]
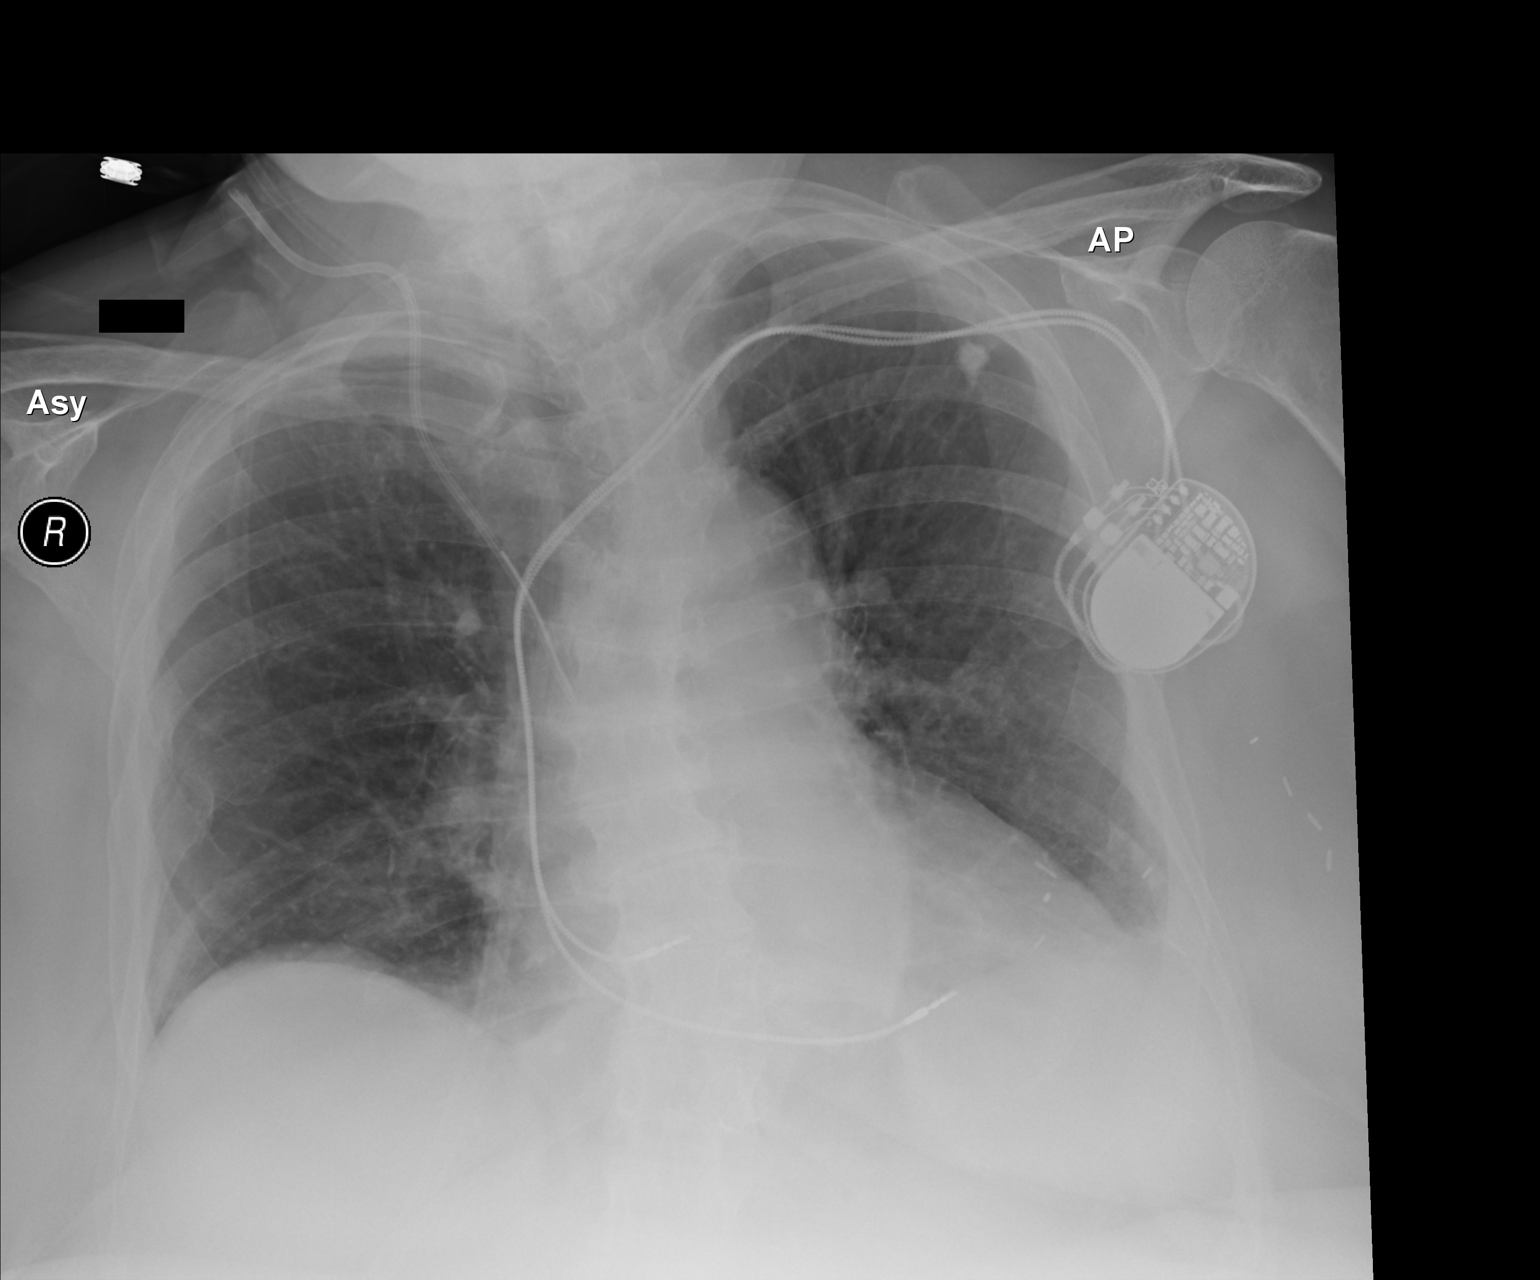

[1 of 1 positions shown; findings below may reference images not displayed]

FINDINGS: Right IJ catheter tip is in the cavoatrial junction. There is a left
chest wall pacer device with leads in the right atrial appendage and
right ventricle. Normal heart size. The lung volumes are low. There
is mild atelectasis in the left base. Calcified granulomas noted in
the left upper lobe.
IMPRESSION: 1. Right IJ catheter tip is in the cavoatrial junction. No
pneumothorax.
